# Patient Record
Sex: Male | Born: 1955 | Race: White | Hispanic: No | Marital: Single | Smoking: Never smoker
Health system: Southern US, Community
[De-identification: ages and names within clinical notes are randomized; demographics above are authoritative.]

## PROBLEM LIST (undated history)

## (undated) ENCOUNTER — Encounter

## (undated) ENCOUNTER — Telehealth: Attending: Cardiovascular Disease | Primary: Cardiovascular Disease

## (undated) ENCOUNTER — Ambulatory Visit: Payer: PRIVATE HEALTH INSURANCE | Attending: Adult Health | Primary: Adult Health

## (undated) ENCOUNTER — Telehealth

## (undated) ENCOUNTER — Ambulatory Visit: Payer: PRIVATE HEALTH INSURANCE

## (undated) ENCOUNTER — Inpatient Hospital Stay

## (undated) ENCOUNTER — Encounter: Attending: Family | Primary: Family

## (undated) ENCOUNTER — Encounter: Attending: Adult Health | Primary: Adult Health

## (undated) ENCOUNTER — Ambulatory Visit: Payer: MEDICARE

## (undated) ENCOUNTER — Encounter: Attending: Cardiovascular Disease | Primary: Cardiovascular Disease

## (undated) ENCOUNTER — Ambulatory Visit

## (undated) ENCOUNTER — Ambulatory Visit
Payer: PRIVATE HEALTH INSURANCE | Attending: Rehabilitative and Restorative Service Providers" | Primary: Rehabilitative and Restorative Service Providers"

## (undated) ENCOUNTER — Telehealth: Attending: Adult Health | Primary: Adult Health

## (undated) ENCOUNTER — Ambulatory Visit: Payer: PRIVATE HEALTH INSURANCE | Attending: Geriatric Medicine | Primary: Geriatric Medicine

## (undated) ENCOUNTER — Ambulatory Visit: Payer: PRIVATE HEALTH INSURANCE | Attending: Internal Medicine | Primary: Internal Medicine

## (undated) ENCOUNTER — Ambulatory Visit: Payer: PRIVATE HEALTH INSURANCE | Attending: Cardiovascular Disease | Primary: Cardiovascular Disease

## (undated) ENCOUNTER — Encounter
Attending: Student in an Organized Health Care Education/Training Program | Primary: Student in an Organized Health Care Education/Training Program

## (undated) ENCOUNTER — Ambulatory Visit: Attending: Cardiovascular Disease | Primary: Cardiovascular Disease

## (undated) ENCOUNTER — Telehealth
Attending: Student in an Organized Health Care Education/Training Program | Primary: Student in an Organized Health Care Education/Training Program

## (undated) ENCOUNTER — Encounter: Attending: Specialist | Primary: Specialist

## (undated) ENCOUNTER — Encounter: Attending: Pulmonary Disease | Primary: Pulmonary Disease

## (undated) ENCOUNTER — Other Ambulatory Visit

## (undated) ENCOUNTER — Encounter: Attending: Vascular Surgery | Primary: Vascular Surgery

## (undated) ENCOUNTER — Telehealth: Attending: Pulmonary Disease | Primary: Pulmonary Disease

## (undated) ENCOUNTER — Encounter: Attending: Internal Medicine | Primary: Internal Medicine

## (undated) ENCOUNTER — Telehealth: Attending: Family | Primary: Family

## (undated) ENCOUNTER — Ambulatory Visit: Attending: Adult Health | Primary: Adult Health

## (undated) ENCOUNTER — Ambulatory Visit: Payer: MEDICARE | Attending: Adult Health | Primary: Adult Health

## (undated) ENCOUNTER — Telehealth: Attending: Registered" | Primary: Registered"

## (undated) ENCOUNTER — Ambulatory Visit: Payer: MEDICARE | Attending: Internal Medicine | Primary: Internal Medicine

## (undated) ENCOUNTER — Ambulatory Visit
Payer: MEDICARE | Attending: Student in an Organized Health Care Education/Training Program | Primary: Student in an Organized Health Care Education/Training Program

## (undated) ENCOUNTER — Telehealth: Attending: Internal Medicine | Primary: Internal Medicine

## (undated) ENCOUNTER — Ambulatory Visit
Attending: Rehabilitative and Restorative Service Providers" | Primary: Rehabilitative and Restorative Service Providers"

## (undated) ENCOUNTER — Ambulatory Visit: Payer: MEDICARE | Attending: Cardiovascular Disease | Primary: Cardiovascular Disease

## (undated) ENCOUNTER — Ambulatory Visit: Payer: MEDICARE | Attending: Vascular Surgery | Primary: Vascular Surgery

## (undated) ENCOUNTER — Ambulatory Visit: Attending: Pharmacist | Primary: Pharmacist

## (undated) ENCOUNTER — Ambulatory Visit: Payer: MEDICARE | Attending: Nephrology | Primary: Nephrology

## (undated) ENCOUNTER — Ambulatory Visit: Payer: PRIVATE HEALTH INSURANCE | Attending: General Practice | Primary: General Practice

## (undated) ENCOUNTER — Ambulatory Visit: Attending: Nephrology | Primary: Nephrology

## (undated) ENCOUNTER — Telehealth: Attending: Vascular Surgery | Primary: Vascular Surgery

## (undated) ENCOUNTER — Ambulatory Visit
Payer: PRIVATE HEALTH INSURANCE | Attending: Student in an Organized Health Care Education/Training Program | Primary: Student in an Organized Health Care Education/Training Program

## (undated) ENCOUNTER — Telehealth: Attending: Infectious Disease | Primary: Infectious Disease

## (undated) ENCOUNTER — Encounter: Attending: Diagnostic Radiology | Primary: Diagnostic Radiology

## (undated) DIAGNOSIS — M810 Age-related osteoporosis without current pathological fracture: Secondary | ICD-10-CM

## (undated) DIAGNOSIS — N289 Disorder of kidney and ureter, unspecified: Secondary | ICD-10-CM

## (undated) DIAGNOSIS — I509 Heart failure, unspecified: Secondary | ICD-10-CM

## (undated) DIAGNOSIS — D649 Anemia, unspecified: Secondary | ICD-10-CM

## (undated) DIAGNOSIS — I10 Essential (primary) hypertension: Secondary | ICD-10-CM

## (undated) HISTORY — PX: HEART TRANSPLANT: SHX268

## (undated) HISTORY — PX: AV FISTULA PLACEMENT: SHX1204

---
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
## 2017-04-19 ENCOUNTER — Ambulatory Visit
Admission: RE | Admit: 2017-04-19 | Discharge: 2017-05-18 | Disposition: A | Discharge status: Home / Self Care | Payer: Commercial Managed Care - PPO

## 2017-04-19 ENCOUNTER — Ambulatory Visit: Admission: RE | Admit: 2017-04-19 | Discharge: 2017-05-18 | Disposition: A | Discharge status: Home / Self Care

## 2017-05-03 DIAGNOSIS — I213 ST elevation (STEMI) myocardial infarction of unspecified site: Principal | ICD-10-CM

## 2017-05-05 DIAGNOSIS — I213 ST elevation (STEMI) myocardial infarction of unspecified site: Principal | ICD-10-CM

## 2017-05-08 DIAGNOSIS — I213 ST elevation (STEMI) myocardial infarction of unspecified site: Principal | ICD-10-CM

## 2017-05-10 DIAGNOSIS — I213 ST elevation (STEMI) myocardial infarction of unspecified site: Principal | ICD-10-CM

## 2017-05-12 DIAGNOSIS — I213 ST elevation (STEMI) myocardial infarction of unspecified site: Principal | ICD-10-CM

## 2017-05-15 DIAGNOSIS — I213 ST elevation (STEMI) myocardial infarction of unspecified site: Principal | ICD-10-CM

## 2017-05-17 ENCOUNTER — Ambulatory Visit: Admission: RE | Admit: 2017-05-17 | Discharge: 2017-05-17 | Payer: Commercial Managed Care - PPO

## 2017-05-17 DIAGNOSIS — I213 ST elevation (STEMI) myocardial infarction of unspecified site: Principal | ICD-10-CM

## 2017-05-17 DIAGNOSIS — I509 Heart failure, unspecified: Principal | ICD-10-CM

## 2017-05-18 ENCOUNTER — Ambulatory Visit: Admission: RE | Admit: 2017-05-18 | Discharge: 2017-05-18 | Admitting: Cardiovascular Disease

## 2017-05-18 ENCOUNTER — Ambulatory Visit: Admit: 2017-05-18 | Discharge: 2017-05-18 | Payer: Commercial Managed Care - PPO

## 2017-05-18 DIAGNOSIS — I255 Ischemic cardiomyopathy: Secondary | ICD-10-CM

## 2017-05-18 DIAGNOSIS — I5022 Chronic systolic (congestive) heart failure: Principal | ICD-10-CM

## 2017-05-18 DIAGNOSIS — I2510 Atherosclerotic heart disease of native coronary artery without angina pectoris: Secondary | ICD-10-CM

## 2017-05-18 DIAGNOSIS — N183 Chronic kidney disease, stage 3 (moderate): Secondary | ICD-10-CM

## 2017-05-18 MED ORDER — FUROSEMIDE 20 MG TABLET
ORAL_TABLET | Freq: Every day | ORAL | 11 refills | 0 days | Status: SS
Start: 2017-05-18 — End: 2017-06-26

## 2017-05-19 ENCOUNTER — Ambulatory Visit: Admission: RE | Admit: 2017-05-19 | Discharge: 2017-06-17 | Disposition: A | Discharge status: Home / Self Care

## 2017-05-19 ENCOUNTER — Ambulatory Visit
Admission: RE | Admit: 2017-05-19 | Discharge: 2017-06-17 | Disposition: A | Discharge status: Home / Self Care | Payer: Commercial Managed Care - PPO

## 2017-05-19 DIAGNOSIS — I213 ST elevation (STEMI) myocardial infarction of unspecified site: Principal | ICD-10-CM

## 2017-05-22 DIAGNOSIS — I213 ST elevation (STEMI) myocardial infarction of unspecified site: Principal | ICD-10-CM

## 2017-05-24 ENCOUNTER — Ambulatory Visit: Admission: RE | Admit: 2017-05-24 | Discharge: 2017-05-24

## 2017-05-24 DIAGNOSIS — I2102 ST elevation (STEMI) myocardial infarction involving left anterior descending coronary artery: Principal | ICD-10-CM

## 2017-05-24 DIAGNOSIS — I509 Heart failure, unspecified: Principal | ICD-10-CM

## 2017-05-26 DIAGNOSIS — I2102 ST elevation (STEMI) myocardial infarction involving left anterior descending coronary artery: Principal | ICD-10-CM

## 2017-05-29 DIAGNOSIS — I2102 ST elevation (STEMI) myocardial infarction involving left anterior descending coronary artery: Principal | ICD-10-CM

## 2017-05-29 MED ORDER — LISINOPRIL 5 MG TABLET
ORAL_TABLET | Freq: Every day | ORAL | 2 refills | 0 days | Status: SS
Start: 2017-05-29 — End: 2017-06-26

## 2017-05-29 MED ORDER — ATORVASTATIN 80 MG TABLET
ORAL_TABLET | Freq: Every evening | ORAL | 2 refills | 0 days | Status: CP
Start: 2017-05-29 — End: 2017-08-20

## 2017-05-29 MED ORDER — METOPROLOL SUCCINATE ER 100 MG TABLET,EXTENDED RELEASE 24 HR
ORAL_TABLET | Freq: Every day | ORAL | 11 refills | 0 days | Status: SS
Start: 2017-05-29 — End: 2017-06-26

## 2017-05-29 MED ORDER — PRASUGREL 10 MG TABLET
ORAL_TABLET | Freq: Every day | ORAL | 2 refills | 0.00000 days | Status: CP
Start: 2017-05-29 — End: 2017-08-20

## 2017-05-31 DIAGNOSIS — I2102 ST elevation (STEMI) myocardial infarction involving left anterior descending coronary artery: Principal | ICD-10-CM

## 2017-06-02 DIAGNOSIS — I2102 ST elevation (STEMI) myocardial infarction involving left anterior descending coronary artery: Principal | ICD-10-CM

## 2017-06-05 DIAGNOSIS — I2102 ST elevation (STEMI) myocardial infarction involving left anterior descending coronary artery: Principal | ICD-10-CM

## 2017-06-07 DIAGNOSIS — I2102 ST elevation (STEMI) myocardial infarction involving left anterior descending coronary artery: Principal | ICD-10-CM

## 2017-06-08 ENCOUNTER — Ambulatory Visit
Admission: RE | Admit: 2017-06-08 | Discharge: 2017-06-08 | Payer: Commercial Managed Care - PPO | Attending: Cardiovascular Disease | Admitting: Cardiovascular Disease

## 2017-06-08 DIAGNOSIS — E782 Mixed hyperlipidemia: Secondary | ICD-10-CM

## 2017-06-08 DIAGNOSIS — I2510 Atherosclerotic heart disease of native coronary artery without angina pectoris: Principal | ICD-10-CM

## 2017-06-08 DIAGNOSIS — I5022 Chronic systolic (congestive) heart failure: Secondary | ICD-10-CM

## 2017-06-08 DIAGNOSIS — I2102 ST elevation (STEMI) myocardial infarction involving left anterior descending coronary artery: Secondary | ICD-10-CM

## 2017-06-08 MED ORDER — APIXABAN 5 MG TABLET
ORAL_TABLET | Freq: Two times a day (BID) | ORAL | 11 refills | 0.00000 days | Status: CP
Start: 2017-06-08 — End: 2017-07-11

## 2017-06-09 DIAGNOSIS — I2102 ST elevation (STEMI) myocardial infarction involving left anterior descending coronary artery: Principal | ICD-10-CM

## 2017-06-12 ENCOUNTER — Ambulatory Visit: Admission: RE | Admit: 2017-06-12 | Discharge: 2017-06-12 | Admitting: Otolaryngology

## 2017-06-12 DIAGNOSIS — R040 Epistaxis: Principal | ICD-10-CM

## 2017-06-12 DIAGNOSIS — D68318 Other hemorrhagic disorder due to intrinsic circulating anticoagulants, antibodies, or inhibitors: Secondary | ICD-10-CM

## 2017-06-14 DIAGNOSIS — I2102 ST elevation (STEMI) myocardial infarction involving left anterior descending coronary artery: Principal | ICD-10-CM

## 2017-06-16 DIAGNOSIS — I2102 ST elevation (STEMI) myocardial infarction involving left anterior descending coronary artery: Principal | ICD-10-CM

## 2017-06-19 ENCOUNTER — Ambulatory Visit: Admission: RE | Admit: 2017-06-19 | Discharge: 2017-07-17 | Disposition: A | Discharge status: Home / Self Care

## 2017-06-19 ENCOUNTER — Ambulatory Visit
Admission: RE | Admit: 2017-06-19 | Discharge: 2017-07-17 | Disposition: A | Discharge status: Home / Self Care | Payer: Commercial Managed Care - PPO

## 2017-06-19 DIAGNOSIS — I2102 ST elevation (STEMI) myocardial infarction involving left anterior descending coronary artery: Principal | ICD-10-CM

## 2017-06-21 ENCOUNTER — Ambulatory Visit: Admission: RE | Admit: 2017-06-21 | Discharge: 2017-06-21 | Payer: Commercial Managed Care - PPO

## 2017-06-21 DIAGNOSIS — I509 Heart failure, unspecified: Principal | ICD-10-CM

## 2017-06-21 DIAGNOSIS — I2102 ST elevation (STEMI) myocardial infarction involving left anterior descending coronary artery: Principal | ICD-10-CM

## 2017-06-22 ENCOUNTER — Inpatient Hospital Stay
Admission: EM | Admit: 2017-06-22 | Discharge: 2017-06-26 | Disposition: A | Discharge status: Home / Self Care | Source: Assisted Living Facility | Attending: Cardiovascular Disease

## 2017-06-22 ENCOUNTER — Inpatient Hospital Stay
Admission: EM | Admit: 2017-06-22 | Discharge: 2017-06-26 | Disposition: A | Discharge status: Home / Self Care | Payer: Commercial Managed Care - PPO | Source: Assisted Living Facility

## 2017-06-22 ENCOUNTER — Inpatient Hospital Stay
Admission: EM | Admit: 2017-06-22 | Discharge: 2017-06-26 | Disposition: A | Discharge status: Home / Self Care | Source: Assisted Living Facility

## 2017-06-22 ENCOUNTER — Ambulatory Visit: Admission: RE | Admit: 2017-06-22 | Discharge: 2017-06-22

## 2017-06-22 DIAGNOSIS — I509 Heart failure, unspecified: Principal | ICD-10-CM

## 2017-06-22 DIAGNOSIS — I959 Hypotension, unspecified: Principal | ICD-10-CM

## 2017-06-22 DIAGNOSIS — I5022 Chronic systolic (congestive) heart failure: Principal | ICD-10-CM

## 2017-06-22 DIAGNOSIS — I255 Ischemic cardiomyopathy: Secondary | ICD-10-CM

## 2017-06-25 DIAGNOSIS — I959 Hypotension, unspecified: Principal | ICD-10-CM

## 2017-06-26 DIAGNOSIS — I959 Hypotension, unspecified: Principal | ICD-10-CM

## 2017-06-26 MED ORDER — LISINOPRIL 5 MG TABLET
ORAL_TABLET | Freq: Two times a day (BID) | ORAL | 0 refills | 0 days
Start: 2017-06-26 — End: 2017-08-08

## 2017-06-26 MED ORDER — METOPROLOL SUCCINATE ER 25 MG TABLET,EXTENDED RELEASE 24 HR
ORAL_TABLET | Freq: Every evening | ORAL | 3 refills | 0.00000 days | Status: CP
Start: 2017-06-26 — End: 2017-09-29

## 2017-06-26 MED ORDER — FUROSEMIDE 20 MG TABLET
ORAL_TABLET | 0 refills | 0 days
Start: 2017-06-26 — End: 2017-08-21

## 2017-06-28 ENCOUNTER — Ambulatory Visit: Admission: RE | Admit: 2017-06-28 | Discharge: 2017-06-28

## 2017-07-04 ENCOUNTER — Ambulatory Visit: Admission: RE | Admit: 2017-07-04 | Discharge: 2017-07-04

## 2017-07-04 DIAGNOSIS — I255 Ischemic cardiomyopathy: Secondary | ICD-10-CM

## 2017-07-04 DIAGNOSIS — I2510 Atherosclerotic heart disease of native coronary artery without angina pectoris: Principal | ICD-10-CM

## 2017-07-05 ENCOUNTER — Ambulatory Visit: Admission: RE | Admit: 2017-07-05 | Discharge: 2017-07-05 | Payer: Commercial Managed Care - PPO

## 2017-07-10 DIAGNOSIS — I2102 ST elevation (STEMI) myocardial infarction involving left anterior descending coronary artery: Principal | ICD-10-CM

## 2017-07-11 MED ORDER — APIXABAN 5 MG TABLET
ORAL_TABLET | Freq: Two times a day (BID) | ORAL | 2 refills | 0 days | Status: CP
Start: 2017-07-11 — End: 2017-08-24

## 2017-07-12 ENCOUNTER — Ambulatory Visit: Admission: RE | Admit: 2017-07-12 | Discharge: 2017-07-12 | Payer: Commercial Managed Care - PPO

## 2017-07-12 ENCOUNTER — Ambulatory Visit
Admission: RE | Admit: 2017-07-12 | Discharge: 2017-07-12 | Payer: Commercial Managed Care - PPO | Attending: Cardiovascular Disease | Admitting: Cardiovascular Disease

## 2017-07-12 DIAGNOSIS — I2102 ST elevation (STEMI) myocardial infarction involving left anterior descending coronary artery: Principal | ICD-10-CM

## 2017-07-12 DIAGNOSIS — I509 Heart failure, unspecified: Principal | ICD-10-CM

## 2017-07-12 DIAGNOSIS — I255 Ischemic cardiomyopathy: Principal | ICD-10-CM

## 2017-07-13 ENCOUNTER — Ambulatory Visit
Admission: RE | Admit: 2017-07-13 | Discharge: 2017-07-13 | Disposition: A | Discharge status: Home / Self Care | Payer: Commercial Managed Care - PPO

## 2017-07-13 DIAGNOSIS — I5022 Chronic systolic (congestive) heart failure: Principal | ICD-10-CM

## 2017-07-14 DIAGNOSIS — I2102 ST elevation (STEMI) myocardial infarction involving left anterior descending coronary artery: Principal | ICD-10-CM

## 2017-07-17 DIAGNOSIS — I2102 ST elevation (STEMI) myocardial infarction involving left anterior descending coronary artery: Principal | ICD-10-CM

## 2017-07-19 ENCOUNTER — Ambulatory Visit: Admission: RE | Admit: 2017-07-19 | Discharge: 2017-07-19

## 2017-07-19 ENCOUNTER — Ambulatory Visit: Admission: RE | Admit: 2017-07-19 | Discharge: 2017-08-16 | Disposition: A | Discharge status: Home / Self Care

## 2017-07-19 ENCOUNTER — Ambulatory Visit
Admission: RE | Admit: 2017-07-19 | Discharge: 2017-08-16 | Disposition: A | Discharge status: Home / Self Care | Payer: Commercial Managed Care - PPO

## 2017-07-19 DIAGNOSIS — I2102 ST elevation (STEMI) myocardial infarction involving left anterior descending coronary artery: Principal | ICD-10-CM

## 2017-07-19 DIAGNOSIS — I509 Heart failure, unspecified: Principal | ICD-10-CM

## 2017-07-20 ENCOUNTER — Ambulatory Visit
Admission: RE | Admit: 2017-07-20 | Discharge: 2017-07-20 | Payer: Commercial Managed Care - PPO | Attending: Internal Medicine | Admitting: Internal Medicine

## 2017-07-20 DIAGNOSIS — E782 Mixed hyperlipidemia: Secondary | ICD-10-CM

## 2017-07-20 DIAGNOSIS — I2510 Atherosclerotic heart disease of native coronary artery without angina pectoris: Secondary | ICD-10-CM

## 2017-07-20 DIAGNOSIS — I255 Ischemic cardiomyopathy: Secondary | ICD-10-CM

## 2017-07-20 DIAGNOSIS — I5022 Chronic systolic (congestive) heart failure: Principal | ICD-10-CM

## 2017-07-21 DIAGNOSIS — I2102 ST elevation (STEMI) myocardial infarction involving left anterior descending coronary artery: Principal | ICD-10-CM

## 2017-07-24 DIAGNOSIS — I2102 ST elevation (STEMI) myocardial infarction involving left anterior descending coronary artery: Principal | ICD-10-CM

## 2017-07-26 DIAGNOSIS — I2102 ST elevation (STEMI) myocardial infarction involving left anterior descending coronary artery: Principal | ICD-10-CM

## 2017-07-28 ENCOUNTER — Ambulatory Visit: Admission: RE | Admit: 2017-07-28 | Discharge: 2017-07-28 | Payer: Commercial Managed Care - PPO

## 2017-07-28 DIAGNOSIS — I2510 Atherosclerotic heart disease of native coronary artery without angina pectoris: Secondary | ICD-10-CM

## 2017-07-28 DIAGNOSIS — E782 Mixed hyperlipidemia: Secondary | ICD-10-CM

## 2017-07-28 DIAGNOSIS — I255 Ischemic cardiomyopathy: Secondary | ICD-10-CM

## 2017-07-28 DIAGNOSIS — I5022 Chronic systolic (congestive) heart failure: Principal | ICD-10-CM

## 2017-07-28 DIAGNOSIS — Z5189 Encounter for other specified aftercare: Principal | ICD-10-CM

## 2017-08-08 ENCOUNTER — Ambulatory Visit: Admission: RE | Admit: 2017-08-08 | Discharge: 2017-08-08 | Payer: Commercial Managed Care - PPO

## 2017-08-08 DIAGNOSIS — I509 Heart failure, unspecified: Principal | ICD-10-CM

## 2017-08-08 DIAGNOSIS — I2510 Atherosclerotic heart disease of native coronary artery without angina pectoris: Principal | ICD-10-CM

## 2017-08-08 DIAGNOSIS — I5022 Chronic systolic (congestive) heart failure: Secondary | ICD-10-CM

## 2017-08-08 MED ORDER — LISINOPRIL 5 MG TABLET
ORAL_TABLET | Freq: Two times a day (BID) | ORAL | 0 refills | 0.00000 days | Status: SS
Start: 2017-08-08 — End: 2017-08-24

## 2017-08-21 MED ORDER — ATORVASTATIN 80 MG TABLET
ORAL_TABLET | Freq: Every evening | ORAL | 2 refills | 0.00000 days | Status: CP
Start: 2017-08-21 — End: 2018-03-01

## 2017-08-21 MED ORDER — PRASUGREL 10 MG TABLET
ORAL_TABLET | Freq: Every day | ORAL | 2 refills | 0.00000 days | Status: CP
Start: 2017-08-21 — End: 2018-03-01

## 2017-08-21 MED ORDER — FUROSEMIDE 20 MG TABLET
ORAL_TABLET | 4 refills | 0 days
Start: 2017-08-21 — End: 2017-09-08

## 2017-08-23 ENCOUNTER — Inpatient Hospital Stay
Admission: RE | Admit: 2017-08-23 | Discharge: 2017-08-24 | Disposition: A | Discharge status: Home / Self Care | Payer: Commercial Managed Care - PPO | Attending: Internal Medicine | Admitting: Internal Medicine

## 2017-08-23 ENCOUNTER — Inpatient Hospital Stay: Admission: RE | Admit: 2017-08-23 | Discharge: 2017-08-24 | Disposition: A | Discharge status: Home / Self Care

## 2017-08-23 DIAGNOSIS — I130 Hypertensive heart and chronic kidney disease with heart failure and stage 1 through stage 4 chronic kidney disease, or unspecified chronic kidney disease: Principal | ICD-10-CM

## 2017-08-24 MED ORDER — LISINOPRIL 5 MG TABLET
ORAL_TABLET | Freq: Every morning | ORAL | 0 refills | 0.00000 days
Start: 2017-08-24 — End: 2017-09-29

## 2017-08-30 ENCOUNTER — Ambulatory Visit
Admission: RE | Admit: 2017-08-30 | Discharge: 2017-08-30 | Disposition: A | Discharge status: Home / Self Care | Payer: Commercial Managed Care - PPO | Attending: Cardiovascular Disease | Admitting: Cardiovascular Disease

## 2017-08-30 ENCOUNTER — Ambulatory Visit
Admission: RE | Admit: 2017-08-30 | Discharge: 2017-08-30 | Disposition: A | Discharge status: Home / Self Care | Payer: Commercial Managed Care - PPO

## 2017-08-30 ENCOUNTER — Ambulatory Visit
Admission: RE | Admit: 2017-08-30 | Discharge: 2017-08-30 | Disposition: A | Discharge status: Home / Self Care | Admitting: Cardiovascular Disease

## 2017-08-30 ENCOUNTER — Ambulatory Visit
Admission: RE | Admit: 2017-08-30 | Discharge: 2017-08-30 | Disposition: A | Discharge status: Home / Self Care | Attending: Clinical | Admitting: Clinical

## 2017-08-30 ENCOUNTER — Ambulatory Visit
Admission: RE | Admit: 2017-08-30 | Discharge: 2017-08-30 | Disposition: A | Discharge status: Home / Self Care | Payer: Commercial Managed Care - PPO | Attending: Internal Medicine | Admitting: Internal Medicine

## 2017-08-30 DIAGNOSIS — Z01812 Encounter for preprocedural laboratory examination: Secondary | ICD-10-CM

## 2017-08-30 DIAGNOSIS — I5022 Chronic systolic (congestive) heart failure: Principal | ICD-10-CM

## 2017-08-30 DIAGNOSIS — Z01811 Encounter for preprocedural respiratory examination: Principal | ICD-10-CM

## 2017-08-30 DIAGNOSIS — Z008 Encounter for other general examination: Secondary | ICD-10-CM

## 2017-08-30 DIAGNOSIS — Z01818 Encounter for other preprocedural examination: Secondary | ICD-10-CM

## 2017-08-31 ENCOUNTER — Ambulatory Visit
Admission: RE | Admit: 2017-08-31 | Discharge: 2017-08-31 | Disposition: A | Discharge status: Home / Self Care | Payer: Commercial Managed Care - PPO

## 2017-08-31 ENCOUNTER — Ambulatory Visit
Admission: RE | Admit: 2017-08-31 | Discharge: 2017-08-31 | Disposition: A | Discharge status: Home / Self Care | Payer: Commercial Managed Care - PPO | Attending: Registered" | Admitting: Registered"

## 2017-08-31 ENCOUNTER — Ambulatory Visit: Admission: RE | Admit: 2017-08-31 | Discharge: 2017-08-31 | Disposition: A | Discharge status: Home / Self Care

## 2017-08-31 DIAGNOSIS — Z01818 Encounter for other preprocedural examination: Secondary | ICD-10-CM

## 2017-08-31 DIAGNOSIS — I5022 Chronic systolic (congestive) heart failure: Principal | ICD-10-CM

## 2017-09-01 ENCOUNTER — Ambulatory Visit: Admission: RE | Admit: 2017-09-01 | Discharge: 2017-09-01 | Disposition: A | Discharge status: Home / Self Care

## 2017-09-01 ENCOUNTER — Ambulatory Visit
Admission: RE | Admit: 2017-09-01 | Discharge: 2017-09-01 | Disposition: A | Discharge status: Home / Self Care | Payer: Commercial Managed Care - PPO

## 2017-09-01 DIAGNOSIS — Z01818 Encounter for other preprocedural examination: Principal | ICD-10-CM

## 2017-09-08 ENCOUNTER — Ambulatory Visit: Admission: RE | Admit: 2017-09-08 | Discharge: 2017-09-08 | Admitting: Cardiovascular Disease

## 2017-09-08 ENCOUNTER — Ambulatory Visit: Admission: RE | Admit: 2017-09-08 | Discharge: 2017-09-08

## 2017-09-08 DIAGNOSIS — I513 Intracardiac thrombosis, not elsewhere classified: Secondary | ICD-10-CM

## 2017-09-08 DIAGNOSIS — N183 Chronic kidney disease, stage 3 (moderate): Secondary | ICD-10-CM

## 2017-09-08 DIAGNOSIS — I509 Heart failure, unspecified: Principal | ICD-10-CM

## 2017-09-08 DIAGNOSIS — D649 Anemia, unspecified: Secondary | ICD-10-CM

## 2017-09-08 DIAGNOSIS — I255 Ischemic cardiomyopathy: Secondary | ICD-10-CM

## 2017-09-08 DIAGNOSIS — I2510 Atherosclerotic heart disease of native coronary artery without angina pectoris: Secondary | ICD-10-CM

## 2017-09-08 DIAGNOSIS — I5022 Chronic systolic (congestive) heart failure: Principal | ICD-10-CM

## 2017-09-08 MED ORDER — FUROSEMIDE 20 MG TABLET
ORAL_TABLET | Freq: Every day | ORAL | 4 refills | 0 days
Start: 2017-09-08 — End: 2017-09-29

## 2017-09-20 ENCOUNTER — Ambulatory Visit: Admission: RE | Admit: 2017-09-20 | Discharge: 2017-09-20 | Attending: Family | Admitting: Family

## 2017-09-20 ENCOUNTER — Ambulatory Visit: Admission: RE | Admit: 2017-09-20 | Discharge: 2017-09-20

## 2017-09-20 DIAGNOSIS — D649 Anemia, unspecified: Principal | ICD-10-CM

## 2017-09-20 DIAGNOSIS — I255 Ischemic cardiomyopathy: Secondary | ICD-10-CM

## 2017-09-20 DIAGNOSIS — Z7901 Long term (current) use of anticoagulants: Secondary | ICD-10-CM

## 2017-09-20 DIAGNOSIS — N183 Chronic kidney disease, stage 3 (moderate): Secondary | ICD-10-CM

## 2017-09-20 DIAGNOSIS — Z7902 Long term (current) use of antithrombotics/antiplatelets: Secondary | ICD-10-CM

## 2017-09-20 DIAGNOSIS — I5022 Chronic systolic (congestive) heart failure: Secondary | ICD-10-CM

## 2017-09-20 DIAGNOSIS — I2510 Atherosclerotic heart disease of native coronary artery without angina pectoris: Secondary | ICD-10-CM

## 2017-09-20 DIAGNOSIS — Z95810 Presence of automatic (implantable) cardiac defibrillator: Secondary | ICD-10-CM

## 2017-09-20 MED ORDER — PEG-ELECTROLYTE SOLUTION 420 GRAM ORAL SOLUTION
0 refills | 0 days | Status: CP
Start: 2017-09-20 — End: 2017-10-10

## 2017-09-20 MED ORDER — PANTOPRAZOLE 20 MG TABLET,DELAYED RELEASE
ORAL_TABLET | Freq: Every day | ORAL | 2 refills | 0 days | Status: CP
Start: 2017-09-20 — End: 2017-12-15

## 2017-09-29 MED ORDER — LISINOPRIL 5 MG TABLET
ORAL_TABLET | Freq: Every morning | ORAL | 3 refills | 0 days
Start: 2017-09-29 — End: 2017-10-30

## 2017-09-29 MED ORDER — METOPROLOL SUCCINATE ER 25 MG TABLET,EXTENDED RELEASE 24 HR
ORAL_TABLET | Freq: Every evening | ORAL | 3 refills | 0 days | Status: CP
Start: 2017-09-29 — End: 2017-11-01

## 2017-09-29 MED ORDER — FUROSEMIDE 20 MG TABLET
ORAL_TABLET | Freq: Every day | ORAL | 4 refills | 0 days | Status: CP
Start: 2017-09-29 — End: 2017-10-10

## 2017-09-29 MED ORDER — LISINOPRIL 2.5 MG TABLET
ORAL_TABLET | Freq: Every evening | ORAL | 3 refills | 0.00000 days | Status: CP
Start: 2017-09-29 — End: 2017-10-30

## 2017-10-10 ENCOUNTER — Ambulatory Visit: Admission: RE | Admit: 2017-10-10 | Discharge: 2017-10-10

## 2017-10-10 ENCOUNTER — Ambulatory Visit: Admission: RE | Admit: 2017-10-10 | Discharge: 2017-10-10 | Payer: Commercial Managed Care - PPO

## 2017-10-10 ENCOUNTER — Ambulatory Visit
Admission: RE | Admit: 2017-10-10 | Discharge: 2017-10-10 | Payer: Commercial Managed Care - PPO | Attending: Cardiovascular Disease | Admitting: Cardiovascular Disease

## 2017-10-10 ENCOUNTER — Ambulatory Visit
Admission: RE | Admit: 2017-10-10 | Discharge: 2017-10-10 | Attending: Cardiovascular Disease | Admitting: Cardiovascular Disease

## 2017-10-10 DIAGNOSIS — I2102 ST elevation (STEMI) myocardial infarction involving left anterior descending coronary artery: Secondary | ICD-10-CM

## 2017-10-10 DIAGNOSIS — I5022 Chronic systolic (congestive) heart failure: Secondary | ICD-10-CM

## 2017-10-10 DIAGNOSIS — Z01818 Encounter for other preprocedural examination: Principal | ICD-10-CM

## 2017-10-10 DIAGNOSIS — I2510 Atherosclerotic heart disease of native coronary artery without angina pectoris: Principal | ICD-10-CM

## 2017-10-10 DIAGNOSIS — I509 Heart failure, unspecified: Principal | ICD-10-CM

## 2017-10-10 DIAGNOSIS — E7800 Pure hypercholesterolemia, unspecified: Secondary | ICD-10-CM

## 2017-10-10 DIAGNOSIS — E782 Mixed hyperlipidemia: Secondary | ICD-10-CM

## 2017-10-10 DIAGNOSIS — I255 Ischemic cardiomyopathy: Secondary | ICD-10-CM

## 2017-10-10 DIAGNOSIS — N183 Chronic kidney disease, stage 3 (moderate): Secondary | ICD-10-CM

## 2017-10-10 DIAGNOSIS — I513 Intracardiac thrombosis, not elsewhere classified: Secondary | ICD-10-CM

## 2017-10-10 MED ORDER — APIXABAN 5 MG TABLET
ORAL_TABLET | Freq: Two times a day (BID) | ORAL | 3 refills | 0.00000 days | Status: CP
Start: 2017-10-10 — End: 2018-03-01

## 2017-10-10 MED ORDER — FUROSEMIDE 20 MG TABLET
ORAL_TABLET | Freq: Every day | ORAL | 4 refills | 0 days | Status: CP
Start: 2017-10-10 — End: 2017-12-01

## 2017-10-12 ENCOUNTER — Ambulatory Visit
Admission: RE | Admit: 2017-10-12 | Discharge: 2017-10-12 | Disposition: A | Discharge status: Home / Self Care | Payer: Commercial Managed Care - PPO

## 2017-10-12 DIAGNOSIS — Z01818 Encounter for other preprocedural examination: Principal | ICD-10-CM

## 2017-10-13 ENCOUNTER — Ambulatory Visit: Admit: 2017-10-13 | Discharge: 2017-10-13 | Payer: Commercial Managed Care - PPO

## 2017-10-13 DIAGNOSIS — I255 Ischemic cardiomyopathy: Principal | ICD-10-CM

## 2017-10-13 DIAGNOSIS — I513 Intracardiac thrombosis, not elsewhere classified: Secondary | ICD-10-CM

## 2017-10-30 MED ORDER — LISINOPRIL 2.5 MG TABLET
ORAL_TABLET | Freq: Every evening | ORAL | 3 refills | 0.00000 days | Status: CP
Start: 2017-10-30 — End: 2018-06-15

## 2017-11-01 MED ORDER — METOPROLOL SUCCINATE ER 25 MG TABLET,EXTENDED RELEASE 24 HR
ORAL_TABLET | Freq: Every evening | ORAL | 3 refills | 0.00000 days | Status: CP
Start: 2017-11-01 — End: 2018-06-15

## 2017-11-22 ENCOUNTER — Ambulatory Visit: Admit: 2017-11-22 | Discharge: 2017-11-22 | Payer: PRIVATE HEALTH INSURANCE

## 2017-11-22 DIAGNOSIS — I5022 Chronic systolic (congestive) heart failure: Principal | ICD-10-CM

## 2017-11-22 DIAGNOSIS — I509 Heart failure, unspecified: Principal | ICD-10-CM

## 2017-11-22 MED ORDER — POTASSIUM CHLORIDE ER 20 MEQ TABLET,EXTENDED RELEASE(PART/CRYST)
ORAL_TABLET | Freq: Two times a day (BID) | ORAL | 11 refills | 0 days | Status: CP
Start: 2017-11-22 — End: 2017-12-01

## 2017-11-22 MED ORDER — SPIRONOLACTONE 25 MG TABLET
ORAL_TABLET | Freq: Every day | ORAL | 6 refills | 0 days | Status: CP
Start: 2017-11-22 — End: 2017-12-15

## 2017-12-01 ENCOUNTER — Ambulatory Visit: Admit: 2017-12-01 | Discharge: 2017-12-02 | Payer: PRIVATE HEALTH INSURANCE

## 2017-12-01 ENCOUNTER — Institutional Professional Consult (permissible substitution): Admit: 2017-12-01 | Discharge: 2017-12-02 | Payer: PRIVATE HEALTH INSURANCE

## 2017-12-01 ENCOUNTER — Ambulatory Visit: Admit: 2017-12-01 | Discharge: 2017-12-01 | Payer: PRIVATE HEALTH INSURANCE

## 2017-12-01 DIAGNOSIS — I255 Ischemic cardiomyopathy: Secondary | ICD-10-CM

## 2017-12-01 DIAGNOSIS — I513 Intracardiac thrombosis, not elsewhere classified: Secondary | ICD-10-CM

## 2017-12-01 DIAGNOSIS — I5022 Chronic systolic (congestive) heart failure: Principal | ICD-10-CM

## 2017-12-01 DIAGNOSIS — I2510 Atherosclerotic heart disease of native coronary artery without angina pectoris: Secondary | ICD-10-CM

## 2017-12-01 DIAGNOSIS — Z1211 Encounter for screening for malignant neoplasm of colon: Principal | ICD-10-CM

## 2017-12-01 DIAGNOSIS — I509 Heart failure, unspecified: Principal | ICD-10-CM

## 2017-12-14 ENCOUNTER — Institutional Professional Consult (permissible substitution): Admit: 2017-12-14 | Discharge: 2017-12-15 | Payer: PRIVATE HEALTH INSURANCE

## 2017-12-14 DIAGNOSIS — Z95810 Presence of automatic (implantable) cardiac defibrillator: Principal | ICD-10-CM

## 2017-12-15 MED ORDER — SPIRONOLACTONE 25 MG TABLET
ORAL_TABLET | Freq: Every day | ORAL | 6 refills | 0.00000 days | Status: CP
Start: 2017-12-15 — End: 2018-01-11

## 2017-12-19 MED ORDER — PANTOPRAZOLE 20 MG TABLET,DELAYED RELEASE
ORAL_TABLET | Freq: Every day | ORAL | 1 refills | 0 days | Status: SS
Start: 2017-12-19 — End: 2018-06-14

## 2017-12-21 ENCOUNTER — Encounter
Admit: 2017-12-21 | Discharge: 2017-12-21 | Payer: PRIVATE HEALTH INSURANCE | Attending: Anesthesiology | Primary: Anesthesiology

## 2017-12-21 ENCOUNTER — Encounter: Admit: 2017-12-21 | Discharge: 2017-12-21 | Payer: PRIVATE HEALTH INSURANCE

## 2017-12-21 ENCOUNTER — Ambulatory Visit: Admit: 2017-12-21 | Discharge: 2017-12-21 | Payer: PRIVATE HEALTH INSURANCE

## 2017-12-21 DIAGNOSIS — Z1211 Encounter for screening for malignant neoplasm of colon: Principal | ICD-10-CM

## 2018-01-08 ENCOUNTER — Ambulatory Visit: Admit: 2018-01-08 | Discharge: 2018-01-08 | Payer: PRIVATE HEALTH INSURANCE

## 2018-01-08 DIAGNOSIS — I5022 Chronic systolic (congestive) heart failure: Principal | ICD-10-CM

## 2018-01-08 DIAGNOSIS — I509 Heart failure, unspecified: Principal | ICD-10-CM

## 2018-01-11 MED ORDER — SPIRONOLACTONE 25 MG TABLET
ORAL_TABLET | Freq: Every day | ORAL | 6 refills | 0 days | Status: CP
Start: 2018-01-11 — End: 2018-03-30

## 2018-02-27 MED ORDER — PEG-ELECTROLYTE SOLUTION 420 GRAM ORAL SOLUTION
0 refills | 0 days | Status: SS
Start: 2018-02-27 — End: 2018-05-08

## 2018-03-01 MED ORDER — FUROSEMIDE 40 MG TABLET
ORAL_TABLET | Freq: Every morning | ORAL | 6 refills | 0 days | Status: CP
Start: 2018-03-01 — End: 2018-03-30

## 2018-03-01 MED ORDER — APIXABAN 5 MG TABLET
ORAL_TABLET | Freq: Two times a day (BID) | ORAL | 0 refills | 0 days | Status: CP
Start: 2018-03-01 — End: 2018-06-15

## 2018-03-01 MED ORDER — FUROSEMIDE 20 MG TABLET
ORAL_TABLET | Freq: Every evening | ORAL | 6 refills | 0.00000 days | Status: SS
Start: 2018-03-01 — End: 2018-05-20

## 2018-03-01 MED ORDER — ATORVASTATIN 80 MG TABLET
ORAL_TABLET | Freq: Every morning | ORAL | 1 refills | 0 days | Status: SS
Start: 2018-03-01 — End: 2018-05-20

## 2018-03-01 MED ORDER — PRASUGREL 10 MG TABLET
ORAL_TABLET | Freq: Every day | ORAL | 1 refills | 0 days | Status: CP
Start: 2018-03-01 — End: 2018-06-15

## 2018-03-12 ENCOUNTER — Ambulatory Visit: Admit: 2018-03-12 | Discharge: 2018-03-12 | Payer: PRIVATE HEALTH INSURANCE

## 2018-03-12 DIAGNOSIS — I509 Heart failure, unspecified: Principal | ICD-10-CM

## 2018-03-12 DIAGNOSIS — I5022 Chronic systolic (congestive) heart failure: Principal | ICD-10-CM

## 2018-03-22 ENCOUNTER — Other Ambulatory Visit: Admit: 2018-03-22 | Discharge: 2018-03-22 | Payer: PRIVATE HEALTH INSURANCE

## 2018-03-22 ENCOUNTER — Institutional Professional Consult (permissible substitution): Admit: 2018-03-22 | Discharge: 2018-03-22 | Payer: PRIVATE HEALTH INSURANCE

## 2018-03-22 DIAGNOSIS — I509 Heart failure, unspecified: Principal | ICD-10-CM

## 2018-03-22 DIAGNOSIS — Z125 Encounter for screening for malignant neoplasm of prostate: Secondary | ICD-10-CM

## 2018-03-22 DIAGNOSIS — E782 Mixed hyperlipidemia: Principal | ICD-10-CM

## 2018-03-30 ENCOUNTER — Ambulatory Visit: Admit: 2018-03-30 | Discharge: 2018-03-30 | Payer: PRIVATE HEALTH INSURANCE

## 2018-03-30 ENCOUNTER — Institutional Professional Consult (permissible substitution): Admit: 2018-03-30 | Discharge: 2018-03-30 | Payer: PRIVATE HEALTH INSURANCE

## 2018-03-30 DIAGNOSIS — I509 Heart failure, unspecified: Principal | ICD-10-CM

## 2018-03-30 DIAGNOSIS — D649 Anemia, unspecified: Secondary | ICD-10-CM

## 2018-04-04 ENCOUNTER — Ambulatory Visit: Admit: 2018-04-04 | Discharge: 2018-04-05 | Payer: PRIVATE HEALTH INSURANCE

## 2018-04-04 DIAGNOSIS — E875 Hyperkalemia: Secondary | ICD-10-CM

## 2018-04-04 DIAGNOSIS — D649 Anemia, unspecified: Principal | ICD-10-CM

## 2018-04-10 ENCOUNTER — Encounter: Admit: 2018-04-10 | Discharge: 2018-04-10 | Payer: PRIVATE HEALTH INSURANCE

## 2018-04-10 ENCOUNTER — Ambulatory Visit: Admit: 2018-04-10 | Discharge: 2018-04-10 | Payer: PRIVATE HEALTH INSURANCE

## 2018-04-10 DIAGNOSIS — M542 Cervicalgia: Secondary | ICD-10-CM

## 2018-04-10 DIAGNOSIS — I2510 Atherosclerotic heart disease of native coronary artery without angina pectoris: Secondary | ICD-10-CM

## 2018-04-10 DIAGNOSIS — E875 Hyperkalemia: Secondary | ICD-10-CM

## 2018-04-10 DIAGNOSIS — D649 Anemia, unspecified: Principal | ICD-10-CM

## 2018-05-03 ENCOUNTER — Ambulatory Visit: Admit: 2018-05-03 | Discharge: 2018-05-07 | Payer: PRIVATE HEALTH INSURANCE

## 2018-05-08 ENCOUNTER — Ambulatory Visit
Admit: 2018-05-08 | Discharge: 2018-06-15 | Disposition: A | Discharge status: Home Health | Payer: PRIVATE HEALTH INSURANCE | Admitting: Thoracic Surgery (Cardiothoracic Vascular Surgery)

## 2018-05-08 ENCOUNTER — Encounter
Admit: 2018-05-08 | Discharge: 2018-06-15 | Disposition: A | Discharge status: Home Health | Payer: PRIVATE HEALTH INSURANCE | Attending: Student in an Organized Health Care Education/Training Program | Admitting: Thoracic Surgery (Cardiothoracic Vascular Surgery)

## 2018-05-08 ENCOUNTER — Encounter
Admit: 2018-05-08 | Discharge: 2018-06-15 | Disposition: A | Discharge status: Home Health | Payer: PRIVATE HEALTH INSURANCE | Attending: Anesthesiology | Admitting: Thoracic Surgery (Cardiothoracic Vascular Surgery)

## 2018-05-10 DIAGNOSIS — I130 Hypertensive heart and chronic kidney disease with heart failure and stage 1 through stage 4 chronic kidney disease, or unspecified chronic kidney disease: Principal | ICD-10-CM

## 2018-05-11 DIAGNOSIS — I130 Hypertensive heart and chronic kidney disease with heart failure and stage 1 through stage 4 chronic kidney disease, or unspecified chronic kidney disease: Principal | ICD-10-CM

## 2018-05-16 DIAGNOSIS — I130 Hypertensive heart and chronic kidney disease with heart failure and stage 1 through stage 4 chronic kidney disease, or unspecified chronic kidney disease: Principal | ICD-10-CM

## 2018-05-17 DIAGNOSIS — I130 Hypertensive heart and chronic kidney disease with heart failure and stage 1 through stage 4 chronic kidney disease, or unspecified chronic kidney disease: Principal | ICD-10-CM

## 2018-05-18 ENCOUNTER — Ambulatory Visit
Admit: 2018-05-18 | Discharge: 2018-05-19 | Payer: PRIVATE HEALTH INSURANCE | Attending: General Practice | Primary: General Practice

## 2018-05-18 DIAGNOSIS — I130 Hypertensive heart and chronic kidney disease with heart failure and stage 1 through stage 4 chronic kidney disease, or unspecified chronic kidney disease: Principal | ICD-10-CM

## 2018-05-18 DIAGNOSIS — Z0120 Encounter for dental examination and cleaning without abnormal findings: Principal | ICD-10-CM

## 2018-05-19 DIAGNOSIS — I130 Hypertensive heart and chronic kidney disease with heart failure and stage 1 through stage 4 chronic kidney disease, or unspecified chronic kidney disease: Principal | ICD-10-CM

## 2018-05-20 DIAGNOSIS — I130 Hypertensive heart and chronic kidney disease with heart failure and stage 1 through stage 4 chronic kidney disease, or unspecified chronic kidney disease: Principal | ICD-10-CM

## 2018-05-21 DIAGNOSIS — I130 Hypertensive heart and chronic kidney disease with heart failure and stage 1 through stage 4 chronic kidney disease, or unspecified chronic kidney disease: Principal | ICD-10-CM

## 2018-05-22 ENCOUNTER — Ambulatory Visit: Admit: 2018-05-22 | Discharge: 2018-05-23 | Attending: General Practice | Primary: General Practice

## 2018-05-22 DIAGNOSIS — K047 Periapical abscess without sinus: Secondary | ICD-10-CM

## 2018-05-22 DIAGNOSIS — K029 Dental caries, unspecified: Principal | ICD-10-CM

## 2018-05-22 DIAGNOSIS — K0889 Other specified disorders of teeth and supporting structures: Secondary | ICD-10-CM

## 2018-05-22 DIAGNOSIS — I130 Hypertensive heart and chronic kidney disease with heart failure and stage 1 through stage 4 chronic kidney disease, or unspecified chronic kidney disease: Principal | ICD-10-CM

## 2018-05-23 DIAGNOSIS — I130 Hypertensive heart and chronic kidney disease with heart failure and stage 1 through stage 4 chronic kidney disease, or unspecified chronic kidney disease: Principal | ICD-10-CM

## 2018-05-24 DIAGNOSIS — I130 Hypertensive heart and chronic kidney disease with heart failure and stage 1 through stage 4 chronic kidney disease, or unspecified chronic kidney disease: Principal | ICD-10-CM

## 2018-05-25 DIAGNOSIS — I130 Hypertensive heart and chronic kidney disease with heart failure and stage 1 through stage 4 chronic kidney disease, or unspecified chronic kidney disease: Principal | ICD-10-CM

## 2018-05-26 DIAGNOSIS — I130 Hypertensive heart and chronic kidney disease with heart failure and stage 1 through stage 4 chronic kidney disease, or unspecified chronic kidney disease: Principal | ICD-10-CM

## 2018-05-27 DIAGNOSIS — I130 Hypertensive heart and chronic kidney disease with heart failure and stage 1 through stage 4 chronic kidney disease, or unspecified chronic kidney disease: Principal | ICD-10-CM

## 2018-05-28 DIAGNOSIS — I130 Hypertensive heart and chronic kidney disease with heart failure and stage 1 through stage 4 chronic kidney disease, or unspecified chronic kidney disease: Principal | ICD-10-CM

## 2018-05-29 DIAGNOSIS — I130 Hypertensive heart and chronic kidney disease with heart failure and stage 1 through stage 4 chronic kidney disease, or unspecified chronic kidney disease: Principal | ICD-10-CM

## 2018-05-30 DIAGNOSIS — I130 Hypertensive heart and chronic kidney disease with heart failure and stage 1 through stage 4 chronic kidney disease, or unspecified chronic kidney disease: Principal | ICD-10-CM

## 2018-05-31 DIAGNOSIS — I130 Hypertensive heart and chronic kidney disease with heart failure and stage 1 through stage 4 chronic kidney disease, or unspecified chronic kidney disease: Principal | ICD-10-CM

## 2018-06-01 DIAGNOSIS — I130 Hypertensive heart and chronic kidney disease with heart failure and stage 1 through stage 4 chronic kidney disease, or unspecified chronic kidney disease: Principal | ICD-10-CM

## 2018-06-02 DIAGNOSIS — I130 Hypertensive heart and chronic kidney disease with heart failure and stage 1 through stage 4 chronic kidney disease, or unspecified chronic kidney disease: Principal | ICD-10-CM

## 2018-06-03 DIAGNOSIS — I130 Hypertensive heart and chronic kidney disease with heart failure and stage 1 through stage 4 chronic kidney disease, or unspecified chronic kidney disease: Principal | ICD-10-CM

## 2018-06-04 DIAGNOSIS — I130 Hypertensive heart and chronic kidney disease with heart failure and stage 1 through stage 4 chronic kidney disease, or unspecified chronic kidney disease: Principal | ICD-10-CM

## 2018-06-05 DIAGNOSIS — I130 Hypertensive heart and chronic kidney disease with heart failure and stage 1 through stage 4 chronic kidney disease, or unspecified chronic kidney disease: Principal | ICD-10-CM

## 2018-06-06 DIAGNOSIS — I130 Hypertensive heart and chronic kidney disease with heart failure and stage 1 through stage 4 chronic kidney disease, or unspecified chronic kidney disease: Principal | ICD-10-CM

## 2018-06-07 DIAGNOSIS — I130 Hypertensive heart and chronic kidney disease with heart failure and stage 1 through stage 4 chronic kidney disease, or unspecified chronic kidney disease: Principal | ICD-10-CM

## 2018-06-08 DIAGNOSIS — I130 Hypertensive heart and chronic kidney disease with heart failure and stage 1 through stage 4 chronic kidney disease, or unspecified chronic kidney disease: Principal | ICD-10-CM

## 2018-06-09 DIAGNOSIS — I130 Hypertensive heart and chronic kidney disease with heart failure and stage 1 through stage 4 chronic kidney disease, or unspecified chronic kidney disease: Principal | ICD-10-CM

## 2018-06-10 DIAGNOSIS — I130 Hypertensive heart and chronic kidney disease with heart failure and stage 1 through stage 4 chronic kidney disease, or unspecified chronic kidney disease: Principal | ICD-10-CM

## 2018-06-12 DIAGNOSIS — I130 Hypertensive heart and chronic kidney disease with heart failure and stage 1 through stage 4 chronic kidney disease, or unspecified chronic kidney disease: Principal | ICD-10-CM

## 2018-06-14 ENCOUNTER — Inpatient Hospital Stay: Admit: 2018-06-14 | Payer: PRIVATE HEALTH INSURANCE

## 2018-06-14 MED ORDER — COLCHICINE 0.6 MG TABLET
ORAL_TABLET | Freq: Every day | ORAL | 11 refills | 0.00000 days | Status: CP | PRN
Start: 2018-06-14 — End: 2018-08-23
  Filled 2018-06-15: qty 10, 10d supply, fill #0

## 2018-06-14 MED ORDER — CHOLECALCIFEROL (VITAMIN D3) 125 MCG (5,000 UNIT) CAPSULE
ORAL_CAPSULE | Freq: Every day | ORAL | 9 refills | 0 days | Status: CP
Start: 2018-06-14 — End: ?
  Filled 2018-06-15: qty 100, 100d supply, fill #0

## 2018-06-14 MED ORDER — WARFARIN 1 MG TABLET
ORAL_TABLET | 11 refills | 0 days | Status: CP
Start: 2018-06-14 — End: 2018-06-15

## 2018-06-14 MED ORDER — ATORVASTATIN 80 MG TABLET
ORAL_TABLET | Freq: Every day | ORAL | 11 refills | 0 days | Status: CP
Start: 2018-06-14 — End: 2018-07-21

## 2018-06-14 MED ORDER — MAGNESIUM OXIDE 400 MG (241.3 MG MAGNESIUM) TABLET
ORAL_TABLET | Freq: Two times a day (BID) | ORAL | 3 refills | 0 days | Status: CP
Start: 2018-06-14 — End: 2019-06-14
  Filled 2018-06-15: qty 120, 60d supply, fill #0

## 2018-06-14 MED ORDER — ALLOPURINOL 100 MG TABLET
ORAL_TABLET | Freq: Every day | ORAL | 3 refills | 0.00000 days | Status: CP
Start: 2018-06-14 — End: 2018-07-12
  Filled 2018-06-15: qty 60, 30d supply, fill #0

## 2018-06-14 MED ORDER — PANTOPRAZOLE 20 MG TABLET,DELAYED RELEASE
ORAL_TABLET | Freq: Every day | ORAL | 9 refills | 0 days | Status: CP
Start: 2018-06-14 — End: 2018-09-07
  Filled 2018-06-15: qty 30, 30d supply, fill #0

## 2018-06-14 MED ORDER — FUROSEMIDE 20 MG TABLET
ORAL_TABLET | Freq: Every day | ORAL | 11 refills | 0 days | Status: CP
Start: 2018-06-14 — End: 2018-08-15
  Filled 2018-06-15: qty 60, 30d supply, fill #0

## 2018-06-15 DIAGNOSIS — I130 Hypertensive heart and chronic kidney disease with heart failure and stage 1 through stage 4 chronic kidney disease, or unspecified chronic kidney disease: Principal | ICD-10-CM

## 2018-06-15 MED ORDER — WARFARIN 5 MG TABLET: tablet | 11 refills | 0 days | Status: AC

## 2018-06-15 MED ORDER — LOSARTAN 25 MG TABLET
ORAL_TABLET | Freq: Every day | ORAL | 9 refills | 0.00000 days | Status: CP
Start: 2018-06-15 — End: 2018-07-12
  Filled 2018-06-15: qty 30, 30d supply, fill #0

## 2018-06-15 MED ORDER — WARFARIN 5 MG TABLET
ORAL_TABLET | 11 refills | 0.00000 days | Status: CP
Start: 2018-06-15 — End: 2018-06-27
  Filled 2018-06-15: qty 30, 30d supply, fill #0

## 2018-06-15 MED ORDER — WARFARIN 1 MG TABLET
ORAL_TABLET | 11 refills | 0 days | Status: CP
Start: 2018-06-15 — End: 2018-06-27
  Filled 2018-06-15: qty 30, 30d supply, fill #0

## 2018-06-15 MED FILL — WARFARIN 1 MG TABLET: 30 days supply | Qty: 30 | Fill #0 | Status: AC

## 2018-06-15 MED FILL — LOSARTAN 25 MG TABLET: 30 days supply | Qty: 30 | Fill #0 | Status: AC

## 2018-06-15 MED FILL — ALLOPURINOL 100 MG TABLET: 30 days supply | Qty: 60 | Fill #0 | Status: AC

## 2018-06-15 MED FILL — CHOLECALCIFEROL (VITAMIN D3) 125 MCG (5,000 UNIT) CAPSULE: 100 days supply | Qty: 100 | Fill #0 | Status: AC

## 2018-06-15 MED FILL — MAGNESIUM OXIDE 400 MG (241.3 MG MAGNESIUM) TABLET: 60 days supply | Qty: 120 | Fill #0 | Status: AC

## 2018-06-15 MED FILL — PANTOPRAZOLE 20 MG TABLET,DELAYED RELEASE: 30 days supply | Qty: 30 | Fill #0 | Status: AC

## 2018-06-15 MED FILL — WARFARIN 5 MG TABLET: 30 days supply | Qty: 30 | Fill #0 | Status: AC

## 2018-06-15 MED FILL — FUROSEMIDE 20 MG TABLET: 30 days supply | Qty: 60 | Fill #0 | Status: AC

## 2018-06-15 MED FILL — COLCHICINE 0.6 MG TABLET: 10 days supply | Qty: 10 | Fill #0 | Status: AC

## 2018-06-16 MED ORDER — BISACODYL 10 MG/30 ML ENEMA
Freq: Once | RECTAL | 0 refills | 0.00000 days
Start: 2018-06-16 — End: 2018-06-16

## 2018-06-18 ENCOUNTER — Other Ambulatory Visit (HOSPITAL_COMMUNITY)
Admission: RE | Admit: 2018-06-18 | Discharge: 2018-06-18 | Disposition: A | Discharge status: Home / Self Care | Payer: 59 | Source: Skilled Nursing Facility | Attending: Cardiology | Admitting: Cardiology

## 2018-06-18 DIAGNOSIS — I509 Heart failure, unspecified: Secondary | ICD-10-CM | POA: Diagnosis not present

## 2018-06-18 LAB — CBC
HCT: 35.3 % — ABNORMAL LOW (ref 39.0–52.0)
Hemoglobin: 11.1 g/dL — ABNORMAL LOW (ref 13.0–17.0)
MCH: 30.1 pg (ref 26.0–34.0)
MCHC: 31.4 g/dL (ref 30.0–36.0)
MCV: 95.7 fL (ref 78.0–100.0)
PLATELETS: 430 10*3/uL — AB (ref 150–400)
RBC: 3.69 MIL/uL — ABNORMAL LOW (ref 4.22–5.81)
RDW: 16.4 % — ABNORMAL HIGH (ref 11.5–15.5)
WBC: 13 10*3/uL — ABNORMAL HIGH (ref 4.0–10.5)

## 2018-06-18 LAB — BASIC METABOLIC PANEL
Anion gap: 13 (ref 5–15)
BUN: 21 mg/dL — AB (ref 6–20)
CHLORIDE: 101 mmol/L (ref 98–111)
CO2: 28 mmol/L (ref 22–32)
CREATININE: 1.33 mg/dL — AB (ref 0.61–1.24)
Calcium: 9.6 mg/dL (ref 8.9–10.3)
GFR calc Af Amer: >60 mL/min (ref >60)
GFR calc non Af Amer: 57 mL/min — ABNORMAL LOW (ref >60)
Glucose, Bld: 106 mg/dL — ABNORMAL HIGH (ref 70–99)
Potassium: 4.5 mmol/L (ref 3.5–5.1)
SODIUM: 142 mmol/L (ref 135–145)

## 2018-06-18 LAB — PROTIME-INR
INR: 1.85
Prothrombin Time: 21.1 seconds — ABNORMAL HIGH (ref 11.4–15.2)

## 2018-06-27 ENCOUNTER — Ambulatory Visit: Admit: 2018-06-27 | Discharge: 2018-06-28 | Payer: PRIVATE HEALTH INSURANCE

## 2018-06-27 DIAGNOSIS — I5022 Chronic systolic (congestive) heart failure: Secondary | ICD-10-CM

## 2018-06-27 DIAGNOSIS — Z95811 Presence of heart assist device: Secondary | ICD-10-CM

## 2018-06-27 DIAGNOSIS — I429 Cardiomyopathy, unspecified: Principal | ICD-10-CM

## 2018-06-27 DIAGNOSIS — I5020 Unspecified systolic (congestive) heart failure: Principal | ICD-10-CM

## 2018-06-27 MED ORDER — WARFARIN 1 MG TABLET
ORAL_TABLET | 11 refills | 0 days | Status: CP
Start: 2018-06-27 — End: 2018-07-04

## 2018-06-27 MED ORDER — WARFARIN 5 MG TABLET
ORAL_TABLET | 11 refills | 0 days | Status: CP
Start: 2018-06-27 — End: 2018-07-04

## 2018-06-27 MED ORDER — METOPROLOL SUCCINATE ER 25 MG TABLET,EXTENDED RELEASE 24 HR
ORAL_TABLET | Freq: Every day | ORAL | 6 refills | 0 days | Status: CP
Start: 2018-06-27 — End: 2018-08-01

## 2018-07-04 ENCOUNTER — Ambulatory Visit: Admit: 2018-07-04 | Discharge: 2018-07-05 | Payer: PRIVATE HEALTH INSURANCE

## 2018-07-04 ENCOUNTER — Ambulatory Visit
Admit: 2018-07-04 | Discharge: 2018-07-05 | Payer: PRIVATE HEALTH INSURANCE | Attending: Cardiovascular Disease | Primary: Cardiovascular Disease

## 2018-07-04 DIAGNOSIS — Z125 Encounter for screening for malignant neoplasm of prostate: Secondary | ICD-10-CM

## 2018-07-04 DIAGNOSIS — I272 Pulmonary hypertension, unspecified: Secondary | ICD-10-CM

## 2018-07-04 DIAGNOSIS — I255 Ischemic cardiomyopathy: Secondary | ICD-10-CM

## 2018-07-04 DIAGNOSIS — I5022 Chronic systolic (congestive) heart failure: Principal | ICD-10-CM

## 2018-07-04 DIAGNOSIS — E611 Iron deficiency: Secondary | ICD-10-CM

## 2018-07-04 DIAGNOSIS — Z95811 Presence of heart assist device: Secondary | ICD-10-CM

## 2018-07-04 DIAGNOSIS — Z7901 Long term (current) use of anticoagulants: Secondary | ICD-10-CM

## 2018-07-04 MED ORDER — WARFARIN 1 MG TABLET
ORAL_TABLET | Freq: Every day | ORAL | 11 refills | 0 days | Status: CP
Start: 2018-07-04 — End: 2018-08-15

## 2018-07-09 ENCOUNTER — Other Ambulatory Visit: Admit: 2018-07-09 | Discharge: 2018-07-09 | Payer: PRIVATE HEALTH INSURANCE

## 2018-07-09 ENCOUNTER — Ambulatory Visit: Admit: 2018-07-09 | Discharge: 2018-07-09 | Payer: PRIVATE HEALTH INSURANCE

## 2018-07-09 ENCOUNTER — Encounter: Admit: 2018-07-09 | Discharge: 2018-07-09 | Payer: PRIVATE HEALTH INSURANCE

## 2018-07-09 DIAGNOSIS — Z125 Encounter for screening for malignant neoplasm of prostate: Principal | ICD-10-CM

## 2018-07-09 DIAGNOSIS — E782 Mixed hyperlipidemia: Secondary | ICD-10-CM

## 2018-07-12 MED ORDER — ALLOPURINOL 100 MG TABLET
ORAL_TABLET | Freq: Every day | ORAL | 3 refills | 0 days | Status: CP
Start: 2018-07-12 — End: 2019-06-21

## 2018-07-12 MED ORDER — LOSARTAN 25 MG TABLET
ORAL_TABLET | Freq: Every day | ORAL | 9 refills | 0.00000 days | Status: CP
Start: 2018-07-12 — End: 2018-08-01

## 2018-07-16 ENCOUNTER — Ambulatory Visit: Admit: 2018-07-16 | Discharge: 2018-07-17 | Payer: PRIVATE HEALTH INSURANCE

## 2018-07-16 DIAGNOSIS — N183 Chronic kidney disease, stage 3 (moderate): Secondary | ICD-10-CM

## 2018-07-16 DIAGNOSIS — I2510 Atherosclerotic heart disease of native coronary artery without angina pectoris: Secondary | ICD-10-CM

## 2018-07-16 DIAGNOSIS — E782 Mixed hyperlipidemia: Principal | ICD-10-CM

## 2018-07-18 ENCOUNTER — Ambulatory Visit: Admit: 2018-07-18 | Discharge: 2018-07-19 | Payer: PRIVATE HEALTH INSURANCE

## 2018-07-18 ENCOUNTER — Ambulatory Visit
Admit: 2018-07-18 | Discharge: 2018-07-19 | Payer: PRIVATE HEALTH INSURANCE | Attending: Cardiovascular Disease | Primary: Cardiovascular Disease

## 2018-07-18 DIAGNOSIS — N183 Chronic kidney disease, stage 3 (moderate): Secondary | ICD-10-CM

## 2018-07-18 DIAGNOSIS — I5022 Chronic systolic (congestive) heart failure: Principal | ICD-10-CM

## 2018-07-18 DIAGNOSIS — Z95811 Presence of heart assist device: Principal | ICD-10-CM

## 2018-07-18 DIAGNOSIS — I2510 Atherosclerotic heart disease of native coronary artery without angina pectoris: Secondary | ICD-10-CM

## 2018-07-18 DIAGNOSIS — I513 Intracardiac thrombosis, not elsewhere classified: Secondary | ICD-10-CM

## 2018-07-18 DIAGNOSIS — I255 Ischemic cardiomyopathy: Secondary | ICD-10-CM

## 2018-07-24 MED ORDER — ATORVASTATIN 80 MG TABLET
ORAL_TABLET | Freq: Every day | ORAL | 3 refills | 0.00000 days | Status: CP
Start: 2018-07-24 — End: 2019-01-29

## 2018-07-25 ENCOUNTER — Ambulatory Visit: Admit: 2018-07-25 | Discharge: 2018-07-26 | Payer: PRIVATE HEALTH INSURANCE

## 2018-07-25 DIAGNOSIS — I5022 Chronic systolic (congestive) heart failure: Principal | ICD-10-CM

## 2018-08-01 ENCOUNTER — Ambulatory Visit
Admit: 2018-08-01 | Discharge: 2018-08-02 | Payer: PRIVATE HEALTH INSURANCE | Attending: Cardiovascular Disease | Primary: Cardiovascular Disease

## 2018-08-01 ENCOUNTER — Ambulatory Visit: Admit: 2018-08-01 | Discharge: 2018-08-02 | Payer: PRIVATE HEALTH INSURANCE

## 2018-08-01 DIAGNOSIS — N183 Chronic kidney disease, stage 3 (moderate): Secondary | ICD-10-CM

## 2018-08-01 DIAGNOSIS — Z95811 Presence of heart assist device: Principal | ICD-10-CM

## 2018-08-01 DIAGNOSIS — E782 Mixed hyperlipidemia: Secondary | ICD-10-CM

## 2018-08-01 DIAGNOSIS — I5022 Chronic systolic (congestive) heart failure: Principal | ICD-10-CM

## 2018-08-01 DIAGNOSIS — Z95810 Presence of automatic (implantable) cardiac defibrillator: Secondary | ICD-10-CM

## 2018-08-01 DIAGNOSIS — D649 Anemia, unspecified: Secondary | ICD-10-CM

## 2018-08-01 MED ORDER — LOSARTAN 25 MG TABLET
ORAL_TABLET | Freq: Every day | ORAL | 3 refills | 0 days | Status: CP
Start: 2018-08-01 — End: 2018-08-15

## 2018-08-01 MED ORDER — SPIRONOLACTONE 25 MG TABLET
ORAL_TABLET | Freq: Every day | ORAL | 2 refills | 0 days | Status: CP
Start: 2018-08-01 — End: 2018-10-03

## 2018-08-01 MED ORDER — METOPROLOL SUCCINATE ER 25 MG TABLET,EXTENDED RELEASE 24 HR
ORAL_TABLET | Freq: Every day | ORAL | 6 refills | 0.00000 days | Status: CP
Start: 2018-08-01 — End: 2018-12-26

## 2018-08-07 ENCOUNTER — Institutional Professional Consult (permissible substitution): Admit: 2018-08-07 | Discharge: 2018-09-05 | Payer: PRIVATE HEALTH INSURANCE

## 2018-08-07 ENCOUNTER — Ambulatory Visit: Admit: 2018-08-07 | Discharge: 2018-08-08 | Payer: PRIVATE HEALTH INSURANCE

## 2018-08-07 ENCOUNTER — Ambulatory Visit: Admit: 2018-08-07 | Discharge: 2018-09-05 | Payer: PRIVATE HEALTH INSURANCE

## 2018-08-07 DIAGNOSIS — I5022 Chronic systolic (congestive) heart failure: Secondary | ICD-10-CM

## 2018-08-07 DIAGNOSIS — Z95811 Presence of heart assist device: Principal | ICD-10-CM

## 2018-08-13 DIAGNOSIS — I5022 Chronic systolic (congestive) heart failure: Principal | ICD-10-CM

## 2018-08-15 ENCOUNTER — Ambulatory Visit: Admit: 2018-08-15 | Discharge: 2018-08-16 | Payer: PRIVATE HEALTH INSURANCE

## 2018-08-15 ENCOUNTER — Ambulatory Visit: Admit: 2018-08-15 | Discharge: 2018-08-15 | Payer: PRIVATE HEALTH INSURANCE

## 2018-08-15 ENCOUNTER — Ambulatory Visit
Admit: 2018-08-15 | Discharge: 2018-08-15 | Payer: PRIVATE HEALTH INSURANCE | Attending: Cardiovascular Disease | Primary: Cardiovascular Disease

## 2018-08-15 DIAGNOSIS — I513 Intracardiac thrombosis, not elsewhere classified: Secondary | ICD-10-CM

## 2018-08-15 DIAGNOSIS — Z95811 Presence of heart assist device: Secondary | ICD-10-CM

## 2018-08-15 DIAGNOSIS — I5022 Chronic systolic (congestive) heart failure: Principal | ICD-10-CM

## 2018-08-15 DIAGNOSIS — I255 Ischemic cardiomyopathy: Secondary | ICD-10-CM

## 2018-08-15 DIAGNOSIS — I2510 Atherosclerotic heart disease of native coronary artery without angina pectoris: Secondary | ICD-10-CM

## 2018-08-15 DIAGNOSIS — N183 Chronic kidney disease, stage 3 (moderate): Secondary | ICD-10-CM

## 2018-08-15 DIAGNOSIS — D649 Anemia, unspecified: Secondary | ICD-10-CM

## 2018-08-15 DIAGNOSIS — Z95810 Presence of automatic (implantable) cardiac defibrillator: Secondary | ICD-10-CM

## 2018-08-15 MED ORDER — FUROSEMIDE 20 MG TABLET
Freq: Every day | ORAL | 0 days
Start: 2018-08-15 — End: 2018-11-14

## 2018-08-15 MED ORDER — LOSARTAN 25 MG TABLET
ORAL_TABLET | Freq: Two times a day (BID) | ORAL | 3 refills | 0 days | Status: SS
Start: 2018-08-15 — End: 2018-09-04

## 2018-08-15 MED ORDER — WARFARIN 1 MG TABLET
Freq: Every day | ORAL | 0.00000 days
Start: 2018-08-15 — End: 2018-09-13

## 2018-08-17 DIAGNOSIS — I5022 Chronic systolic (congestive) heart failure: Principal | ICD-10-CM

## 2018-08-20 DIAGNOSIS — I5022 Chronic systolic (congestive) heart failure: Principal | ICD-10-CM

## 2018-08-22 DIAGNOSIS — I5022 Chronic systolic (congestive) heart failure: Principal | ICD-10-CM

## 2018-08-23 ENCOUNTER — Ambulatory Visit
Admit: 2018-08-23 | Discharge: 2018-08-24 | Payer: PRIVATE HEALTH INSURANCE | Attending: Cardiovascular Disease | Primary: Cardiovascular Disease

## 2018-08-23 DIAGNOSIS — I5022 Chronic systolic (congestive) heart failure: Secondary | ICD-10-CM

## 2018-08-23 DIAGNOSIS — I255 Ischemic cardiomyopathy: Secondary | ICD-10-CM

## 2018-08-23 DIAGNOSIS — E782 Mixed hyperlipidemia: Secondary | ICD-10-CM

## 2018-08-23 DIAGNOSIS — N183 Chronic kidney disease, stage 3 (moderate): Secondary | ICD-10-CM

## 2018-08-23 DIAGNOSIS — Z95810 Presence of automatic (implantable) cardiac defibrillator: Secondary | ICD-10-CM

## 2018-08-23 DIAGNOSIS — Z95811 Presence of heart assist device: Secondary | ICD-10-CM

## 2018-08-23 DIAGNOSIS — I2510 Atherosclerotic heart disease of native coronary artery without angina pectoris: Principal | ICD-10-CM

## 2018-08-24 DIAGNOSIS — I5022 Chronic systolic (congestive) heart failure: Principal | ICD-10-CM

## 2018-08-27 DIAGNOSIS — I5022 Chronic systolic (congestive) heart failure: Principal | ICD-10-CM

## 2018-08-29 DIAGNOSIS — I5022 Chronic systolic (congestive) heart failure: Principal | ICD-10-CM

## 2018-08-31 ENCOUNTER — Ambulatory Visit: Admit: 2018-08-31 | Discharge: 2018-09-01 | Payer: PRIVATE HEALTH INSURANCE

## 2018-08-31 DIAGNOSIS — I5022 Chronic systolic (congestive) heart failure: Principal | ICD-10-CM

## 2018-09-03 DIAGNOSIS — I5022 Chronic systolic (congestive) heart failure: Principal | ICD-10-CM

## 2018-09-04 ENCOUNTER — Institutional Professional Consult (permissible substitution): Admit: 2018-09-04 | Discharge: 2018-09-04 | Payer: PRIVATE HEALTH INSURANCE

## 2018-09-04 ENCOUNTER — Ambulatory Visit: Admit: 2018-09-04 | Discharge: 2018-09-04 | Payer: PRIVATE HEALTH INSURANCE

## 2018-09-04 DIAGNOSIS — Z95811 Presence of heart assist device: Secondary | ICD-10-CM

## 2018-09-04 DIAGNOSIS — I5022 Chronic systolic (congestive) heart failure: Principal | ICD-10-CM

## 2018-09-04 MED ORDER — ENOXAPARIN 60 MG/0.6 ML SUBCUTANEOUS SYRINGE
INJECTION | Freq: Every day | SUBCUTANEOUS | 1 refills | 0 days | Status: CP
Start: 2018-09-04 — End: 2018-12-07

## 2018-09-04 MED ORDER — SACUBITRIL 24 MG-VALSARTAN 26 MG TABLET
ORAL_TABLET | Freq: Two times a day (BID) | ORAL | 11 refills | 0 days | Status: CP
Start: 2018-09-04 — End: 2018-09-25

## 2018-09-05 DIAGNOSIS — I5022 Chronic systolic (congestive) heart failure: Principal | ICD-10-CM

## 2018-09-07 ENCOUNTER — Institutional Professional Consult (permissible substitution): Admit: 2018-09-07 | Discharge: 2018-10-05 | Payer: PRIVATE HEALTH INSURANCE

## 2018-09-07 ENCOUNTER — Ambulatory Visit: Admit: 2018-09-07 | Discharge: 2018-10-05 | Payer: PRIVATE HEALTH INSURANCE

## 2018-09-07 DIAGNOSIS — I5022 Chronic systolic (congestive) heart failure: Principal | ICD-10-CM

## 2018-09-07 MED ORDER — PANTOPRAZOLE 20 MG TABLET,DELAYED RELEASE
ORAL_TABLET | 1 refills | 0 days | Status: CP
Start: 2018-09-07 — End: 2018-10-22

## 2018-09-10 DIAGNOSIS — I5022 Chronic systolic (congestive) heart failure: Principal | ICD-10-CM

## 2018-09-12 ENCOUNTER — Ambulatory Visit: Admit: 2018-09-12 | Discharge: 2018-09-13 | Payer: PRIVATE HEALTH INSURANCE

## 2018-09-12 DIAGNOSIS — I5022 Chronic systolic (congestive) heart failure: Principal | ICD-10-CM

## 2018-09-13 MED ORDER — WARFARIN 1 MG TABLET
ORAL_TABLET | 11 refills | 0 days | Status: CP
Start: 2018-09-13 — End: 2018-10-04

## 2018-09-14 DIAGNOSIS — I5022 Chronic systolic (congestive) heart failure: Principal | ICD-10-CM

## 2018-09-17 DIAGNOSIS — I5022 Chronic systolic (congestive) heart failure: Principal | ICD-10-CM

## 2018-09-19 DIAGNOSIS — I5022 Chronic systolic (congestive) heart failure: Principal | ICD-10-CM

## 2018-09-24 DIAGNOSIS — I5022 Chronic systolic (congestive) heart failure: Principal | ICD-10-CM

## 2018-09-25 ENCOUNTER — Ambulatory Visit: Admit: 2018-09-25 | Discharge: 2018-09-26 | Payer: PRIVATE HEALTH INSURANCE

## 2018-09-25 DIAGNOSIS — I5022 Chronic systolic (congestive) heart failure: Principal | ICD-10-CM

## 2018-09-25 MED ORDER — SACUBITRIL 24 MG-VALSARTAN 26 MG TABLET
ORAL_TABLET | Freq: Two times a day (BID) | ORAL | 3 refills | 0 days | Status: CP
Start: 2018-09-25 — End: 2018-11-14

## 2018-09-26 DIAGNOSIS — I5022 Chronic systolic (congestive) heart failure: Principal | ICD-10-CM

## 2018-09-28 DIAGNOSIS — I5022 Chronic systolic (congestive) heart failure: Principal | ICD-10-CM

## 2018-10-01 DIAGNOSIS — I5022 Chronic systolic (congestive) heart failure: Principal | ICD-10-CM

## 2018-10-03 ENCOUNTER — Ambulatory Visit: Admit: 2018-10-03 | Discharge: 2018-10-04 | Payer: PRIVATE HEALTH INSURANCE

## 2018-10-03 ENCOUNTER — Institutional Professional Consult (permissible substitution): Admit: 2018-10-03 | Discharge: 2018-10-04 | Payer: PRIVATE HEALTH INSURANCE

## 2018-10-03 ENCOUNTER — Ambulatory Visit
Admit: 2018-10-03 | Discharge: 2018-10-04 | Payer: PRIVATE HEALTH INSURANCE | Attending: Cardiovascular Disease | Primary: Cardiovascular Disease

## 2018-10-03 DIAGNOSIS — I255 Ischemic cardiomyopathy: Secondary | ICD-10-CM

## 2018-10-03 DIAGNOSIS — D649 Anemia, unspecified: Secondary | ICD-10-CM

## 2018-10-03 DIAGNOSIS — I5022 Chronic systolic (congestive) heart failure: Principal | ICD-10-CM

## 2018-10-03 DIAGNOSIS — N183 Chronic kidney disease, stage 3 (moderate): Secondary | ICD-10-CM

## 2018-10-03 DIAGNOSIS — Z95811 Presence of heart assist device: Principal | ICD-10-CM

## 2018-10-03 DIAGNOSIS — I513 Intracardiac thrombosis, not elsewhere classified: Secondary | ICD-10-CM

## 2018-10-03 DIAGNOSIS — Z01818 Encounter for other preprocedural examination: Principal | ICD-10-CM

## 2018-10-03 DIAGNOSIS — I2510 Atherosclerotic heart disease of native coronary artery without angina pectoris: Secondary | ICD-10-CM

## 2018-10-03 DIAGNOSIS — E871 Hypo-osmolality and hyponatremia: Secondary | ICD-10-CM

## 2018-10-03 DIAGNOSIS — Z95810 Presence of automatic (implantable) cardiac defibrillator: Secondary | ICD-10-CM

## 2018-10-04 ENCOUNTER — Ambulatory Visit
Admit: 2018-10-04 | Discharge: 2018-11-02 | Payer: PRIVATE HEALTH INSURANCE | Attending: Rehabilitative and Restorative Service Providers" | Primary: Rehabilitative and Restorative Service Providers"

## 2018-10-04 ENCOUNTER — Ambulatory Visit: Admit: 2018-10-04 | Discharge: 2018-10-05 | Payer: PRIVATE HEALTH INSURANCE

## 2018-10-04 DIAGNOSIS — I5022 Chronic systolic (congestive) heart failure: Principal | ICD-10-CM

## 2018-10-04 DIAGNOSIS — M542 Cervicalgia: Principal | ICD-10-CM

## 2018-10-04 MED ORDER — WARFARIN 1 MG TABLET
ORAL_TABLET | 11 refills | 0 days | Status: CP
Start: 2018-10-04 — End: 2018-11-15

## 2018-10-08 ENCOUNTER — Institutional Professional Consult (permissible substitution): Admit: 2018-10-08 | Discharge: 2018-11-04 | Payer: PRIVATE HEALTH INSURANCE

## 2018-10-08 ENCOUNTER — Ambulatory Visit: Admit: 2018-10-08 | Discharge: 2018-11-04 | Payer: PRIVATE HEALTH INSURANCE

## 2018-10-08 DIAGNOSIS — I5022 Chronic systolic (congestive) heart failure: Principal | ICD-10-CM

## 2018-10-09 DIAGNOSIS — M542 Cervicalgia: Principal | ICD-10-CM

## 2018-10-10 DIAGNOSIS — I5022 Chronic systolic (congestive) heart failure: Principal | ICD-10-CM

## 2018-10-12 ENCOUNTER — Ambulatory Visit: Admit: 2018-10-12 | Discharge: 2018-10-13 | Payer: PRIVATE HEALTH INSURANCE

## 2018-10-12 DIAGNOSIS — I5022 Chronic systolic (congestive) heart failure: Principal | ICD-10-CM

## 2018-10-12 DIAGNOSIS — M542 Cervicalgia: Principal | ICD-10-CM

## 2018-10-15 DIAGNOSIS — I5022 Chronic systolic (congestive) heart failure: Principal | ICD-10-CM

## 2018-10-20 MED ORDER — COLCHICINE 0.6 MG TABLET
ORAL_TABLET | Freq: Every day | ORAL | 2 refills | 0 days | Status: SS | PRN
Start: 2018-10-20 — End: 2019-04-15

## 2018-10-22 DIAGNOSIS — I5022 Chronic systolic (congestive) heart failure: Principal | ICD-10-CM

## 2018-10-22 MED ORDER — PANTOPRAZOLE 20 MG TABLET,DELAYED RELEASE
ORAL_TABLET | 0 refills | 0 days | Status: CP
Start: 2018-10-22 — End: 2018-10-29

## 2018-10-25 DIAGNOSIS — M542 Cervicalgia: Principal | ICD-10-CM

## 2018-10-26 DIAGNOSIS — I5022 Chronic systolic (congestive) heart failure: Principal | ICD-10-CM

## 2018-10-29 DIAGNOSIS — I5022 Chronic systolic (congestive) heart failure: Principal | ICD-10-CM

## 2018-10-29 MED ORDER — PANTOPRAZOLE 20 MG TABLET,DELAYED RELEASE
ORAL_TABLET | 0 refills | 0 days | Status: CP
Start: 2018-10-29 — End: 2019-01-29

## 2018-10-30 DIAGNOSIS — M542 Cervicalgia: Principal | ICD-10-CM

## 2018-10-31 DIAGNOSIS — I5022 Chronic systolic (congestive) heart failure: Principal | ICD-10-CM

## 2018-11-01 DIAGNOSIS — M542 Cervicalgia: Principal | ICD-10-CM

## 2018-11-05 ENCOUNTER — Institutional Professional Consult (permissible substitution): Admit: 2018-11-05 | Discharge: 2018-12-04 | Payer: PRIVATE HEALTH INSURANCE

## 2018-11-05 ENCOUNTER — Ambulatory Visit: Admit: 2018-11-05 | Discharge: 2018-12-04 | Payer: PRIVATE HEALTH INSURANCE

## 2018-11-05 DIAGNOSIS — I5022 Chronic systolic (congestive) heart failure: Principal | ICD-10-CM

## 2018-11-07 ENCOUNTER — Institutional Professional Consult (permissible substitution): Admit: 2018-11-07 | Discharge: 2018-11-08 | Payer: PRIVATE HEALTH INSURANCE

## 2018-11-07 DIAGNOSIS — I5022 Chronic systolic (congestive) heart failure: Principal | ICD-10-CM

## 2018-11-09 ENCOUNTER — Ambulatory Visit
Admit: 2018-11-09 | Discharge: 2018-12-02 | Payer: PRIVATE HEALTH INSURANCE | Attending: Rehabilitative and Restorative Service Providers" | Primary: Rehabilitative and Restorative Service Providers"

## 2018-11-09 DIAGNOSIS — M542 Cervicalgia: Principal | ICD-10-CM

## 2018-11-09 DIAGNOSIS — I5022 Chronic systolic (congestive) heart failure: Principal | ICD-10-CM

## 2018-11-12 DIAGNOSIS — M542 Cervicalgia: Principal | ICD-10-CM

## 2018-11-14 ENCOUNTER — Ambulatory Visit: Admit: 2018-11-14 | Discharge: 2018-11-14 | Payer: PRIVATE HEALTH INSURANCE

## 2018-11-14 ENCOUNTER — Ambulatory Visit
Admit: 2018-11-14 | Discharge: 2018-11-14 | Payer: PRIVATE HEALTH INSURANCE | Attending: Cardiovascular Disease | Primary: Cardiovascular Disease

## 2018-11-14 DIAGNOSIS — Z95811 Presence of heart assist device: Principal | ICD-10-CM

## 2018-11-14 DIAGNOSIS — I5022 Chronic systolic (congestive) heart failure: Secondary | ICD-10-CM

## 2018-11-14 DIAGNOSIS — N183 Chronic kidney disease, stage 3 (moderate): Secondary | ICD-10-CM

## 2018-11-14 DIAGNOSIS — Z01818 Encounter for other preprocedural examination: Principal | ICD-10-CM

## 2018-11-14 MED ORDER — SACUBITRIL 24 MG-VALSARTAN 26 MG TABLET
ORAL_TABLET | Freq: Two times a day (BID) | ORAL | 3 refills | 0 days | Status: CP
Start: 2018-11-14 — End: 2018-11-15

## 2018-11-15 MED ORDER — WARFARIN 1 MG TABLET
ORAL_TABLET | 11 refills | 0 days | Status: CP
Start: 2018-11-15 — End: 2019-01-12

## 2018-11-15 MED ORDER — SACUBITRIL 49 MG-VALSARTAN 51 MG TABLET
ORAL_TABLET | Freq: Two times a day (BID) | ORAL | 3 refills | 0 days | Status: CP
Start: 2018-11-15 — End: 2018-12-26

## 2018-11-16 DIAGNOSIS — M542 Cervicalgia: Principal | ICD-10-CM

## 2018-11-23 DIAGNOSIS — M542 Cervicalgia: Principal | ICD-10-CM

## 2018-11-27 ENCOUNTER — Ambulatory Visit: Admit: 2018-11-27 | Discharge: 2018-11-28 | Payer: PRIVATE HEALTH INSURANCE

## 2018-11-27 DIAGNOSIS — I5022 Chronic systolic (congestive) heart failure: Principal | ICD-10-CM

## 2018-12-07 ENCOUNTER — Institutional Professional Consult (permissible substitution): Admit: 2018-12-07 | Discharge: 2018-12-08 | Payer: PRIVATE HEALTH INSURANCE

## 2018-12-07 DIAGNOSIS — I513 Intracardiac thrombosis, not elsewhere classified: Secondary | ICD-10-CM

## 2018-12-07 DIAGNOSIS — I2510 Atherosclerotic heart disease of native coronary artery without angina pectoris: Secondary | ICD-10-CM

## 2018-12-07 DIAGNOSIS — I255 Ischemic cardiomyopathy: Secondary | ICD-10-CM

## 2018-12-07 DIAGNOSIS — I5022 Chronic systolic (congestive) heart failure: Principal | ICD-10-CM

## 2018-12-12 ENCOUNTER — Ambulatory Visit
Admit: 2018-12-12 | Discharge: 2019-01-01 | Payer: PRIVATE HEALTH INSURANCE | Attending: Rehabilitative and Restorative Service Providers" | Primary: Rehabilitative and Restorative Service Providers"

## 2018-12-12 ENCOUNTER — Ambulatory Visit: Admit: 2018-12-12 | Discharge: 2018-12-13 | Payer: PRIVATE HEALTH INSURANCE

## 2018-12-12 DIAGNOSIS — M542 Cervicalgia: Principal | ICD-10-CM

## 2018-12-12 DIAGNOSIS — I5022 Chronic systolic (congestive) heart failure: Principal | ICD-10-CM

## 2018-12-12 MED ORDER — FUROSEMIDE 20 MG TABLET
ORAL_TABLET | 1 refills | 0 days | Status: CP
Start: 2018-12-12 — End: 2019-04-01

## 2018-12-25 DIAGNOSIS — I5022 Chronic systolic (congestive) heart failure: Principal | ICD-10-CM

## 2018-12-26 ENCOUNTER — Ambulatory Visit
Admit: 2018-12-26 | Discharge: 2018-12-26 | Payer: PRIVATE HEALTH INSURANCE | Attending: Cardiovascular Disease | Primary: Cardiovascular Disease

## 2018-12-26 ENCOUNTER — Ambulatory Visit: Admit: 2018-12-26 | Discharge: 2018-12-26 | Payer: PRIVATE HEALTH INSURANCE

## 2018-12-26 DIAGNOSIS — I255 Ischemic cardiomyopathy: Principal | ICD-10-CM

## 2018-12-26 DIAGNOSIS — I2510 Atherosclerotic heart disease of native coronary artery without angina pectoris: Principal | ICD-10-CM

## 2018-12-26 DIAGNOSIS — Z95810 Presence of automatic (implantable) cardiac defibrillator: Principal | ICD-10-CM

## 2018-12-26 DIAGNOSIS — K4090 Unilateral inguinal hernia, without obstruction or gangrene, not specified as recurrent: Principal | ICD-10-CM

## 2018-12-26 DIAGNOSIS — M952 Other acquired deformity of head: Principal | ICD-10-CM

## 2018-12-26 DIAGNOSIS — I5022 Chronic systolic (congestive) heart failure: Principal | ICD-10-CM

## 2018-12-26 DIAGNOSIS — I252 Old myocardial infarction: Principal | ICD-10-CM

## 2018-12-26 DIAGNOSIS — M1990 Unspecified osteoarthritis, unspecified site: Principal | ICD-10-CM

## 2018-12-26 DIAGNOSIS — B029 Zoster without complications: Principal | ICD-10-CM

## 2018-12-26 DIAGNOSIS — D649 Anemia, unspecified: Principal | ICD-10-CM

## 2018-12-26 DIAGNOSIS — E785 Hyperlipidemia, unspecified: Principal | ICD-10-CM

## 2018-12-26 DIAGNOSIS — Z7901 Long term (current) use of anticoagulants: Principal | ICD-10-CM

## 2018-12-26 DIAGNOSIS — I130 Hypertensive heart and chronic kidney disease with heart failure and stage 1 through stage 4 chronic kidney disease, or unspecified chronic kidney disease: Principal | ICD-10-CM

## 2018-12-26 DIAGNOSIS — I272 Pulmonary hypertension, unspecified: Principal | ICD-10-CM

## 2018-12-26 DIAGNOSIS — K449 Diaphragmatic hernia without obstruction or gangrene: Principal | ICD-10-CM

## 2018-12-26 DIAGNOSIS — N183 Chronic kidney disease, stage 3 (moderate): Principal | ICD-10-CM

## 2018-12-26 DIAGNOSIS — I5084 End stage heart failure: Principal | ICD-10-CM

## 2018-12-26 DIAGNOSIS — N189 Chronic kidney disease, unspecified: Principal | ICD-10-CM

## 2018-12-26 DIAGNOSIS — M419 Scoliosis, unspecified: Principal | ICD-10-CM

## 2018-12-26 DIAGNOSIS — Z8249 Family history of ischemic heart disease and other diseases of the circulatory system: Principal | ICD-10-CM

## 2018-12-26 DIAGNOSIS — I213 ST elevation (STEMI) myocardial infarction of unspecified site: Principal | ICD-10-CM

## 2018-12-26 DIAGNOSIS — Z95811 Presence of heart assist device: Principal | ICD-10-CM

## 2018-12-26 DIAGNOSIS — M109 Gout, unspecified: Principal | ICD-10-CM

## 2018-12-26 DIAGNOSIS — D509 Iron deficiency anemia, unspecified: Principal | ICD-10-CM

## 2018-12-26 DIAGNOSIS — C4491 Basal cell carcinoma of skin, unspecified: Principal | ICD-10-CM

## 2018-12-26 DIAGNOSIS — M542 Cervicalgia: Principal | ICD-10-CM

## 2018-12-26 DIAGNOSIS — I499 Cardiac arrhythmia, unspecified: Principal | ICD-10-CM

## 2018-12-26 DIAGNOSIS — I4891 Unspecified atrial fibrillation: Principal | ICD-10-CM

## 2018-12-26 DIAGNOSIS — I10 Essential (primary) hypertension: Principal | ICD-10-CM

## 2018-12-26 MED ORDER — METOPROLOL SUCCINATE ER 25 MG TABLET,EXTENDED RELEASE 24 HR
ORAL_TABLET | Freq: Every day | ORAL | 11 refills | 0.00000 days | Status: SS
Start: 2018-12-26 — End: 2019-04-15

## 2018-12-26 MED ORDER — ENTRESTO 97 MG-103 MG TABLET
ORAL_TABLET | Freq: Two times a day (BID) | ORAL | 11 refills | 0.00000 days | Status: CP
Start: 2018-12-26 — End: 2019-04-15

## 2019-01-01 DIAGNOSIS — I5022 Chronic systolic (congestive) heart failure: Principal | ICD-10-CM

## 2019-01-02 ENCOUNTER — Other Ambulatory Visit: Admit: 2019-01-02 | Discharge: 2019-01-03 | Payer: PRIVATE HEALTH INSURANCE

## 2019-01-02 DIAGNOSIS — I5022 Chronic systolic (congestive) heart failure: Principal | ICD-10-CM

## 2019-01-02 DIAGNOSIS — Z95811 Presence of heart assist device: Principal | ICD-10-CM

## 2019-01-09 ENCOUNTER — Ambulatory Visit: Admit: 2019-01-09 | Discharge: 2019-01-10 | Payer: PRIVATE HEALTH INSURANCE

## 2019-01-09 DIAGNOSIS — Z95811 Presence of heart assist device: Principal | ICD-10-CM

## 2019-01-10 ENCOUNTER — Ambulatory Visit: Admit: 2019-01-10 | Discharge: 2019-01-11 | Payer: PRIVATE HEALTH INSURANCE

## 2019-01-10 DIAGNOSIS — Z95811 Presence of heart assist device: Principal | ICD-10-CM

## 2019-01-10 DIAGNOSIS — I2510 Atherosclerotic heart disease of native coronary artery without angina pectoris: Principal | ICD-10-CM

## 2019-01-10 DIAGNOSIS — I255 Ischemic cardiomyopathy: Principal | ICD-10-CM

## 2019-01-10 DIAGNOSIS — E782 Mixed hyperlipidemia: Principal | ICD-10-CM

## 2019-01-12 DIAGNOSIS — I5022 Chronic systolic (congestive) heart failure: Principal | ICD-10-CM

## 2019-01-12 DIAGNOSIS — Z95811 Presence of heart assist device: Principal | ICD-10-CM

## 2019-01-12 MED ORDER — WARFARIN 1 MG TABLET
ORAL_TABLET | 11 refills | 0 days | Status: SS
Start: 2019-01-12 — End: 2019-04-15

## 2019-01-15 DIAGNOSIS — I5022 Chronic systolic (congestive) heart failure: Principal | ICD-10-CM

## 2019-01-18 ENCOUNTER — Ambulatory Visit: Admit: 2019-01-18 | Discharge: 2019-01-19 | Payer: PRIVATE HEALTH INSURANCE

## 2019-01-18 DIAGNOSIS — Z95811 Presence of heart assist device: Principal | ICD-10-CM

## 2019-01-18 DIAGNOSIS — I5022 Chronic systolic (congestive) heart failure: Principal | ICD-10-CM

## 2019-01-21 ENCOUNTER — Institutional Professional Consult (permissible substitution)
Admit: 2019-01-21 | Discharge: 2019-01-22 | Payer: PRIVATE HEALTH INSURANCE | Attending: Cardiovascular Disease | Primary: Cardiovascular Disease

## 2019-01-21 DIAGNOSIS — Z95811 Presence of heart assist device: Principal | ICD-10-CM

## 2019-01-21 DIAGNOSIS — I213 ST elevation (STEMI) myocardial infarction of unspecified site: Principal | ICD-10-CM

## 2019-01-21 DIAGNOSIS — C4491 Basal cell carcinoma of skin, unspecified: Principal | ICD-10-CM

## 2019-01-21 DIAGNOSIS — M1990 Unspecified osteoarthritis, unspecified site: Principal | ICD-10-CM

## 2019-01-21 DIAGNOSIS — M109 Gout, unspecified: Principal | ICD-10-CM

## 2019-01-21 DIAGNOSIS — I5042 Chronic combined systolic (congestive) and diastolic (congestive) heart failure: Principal | ICD-10-CM

## 2019-01-21 DIAGNOSIS — I5022 Chronic systolic (congestive) heart failure: Principal | ICD-10-CM

## 2019-01-21 DIAGNOSIS — B029 Zoster without complications: Principal | ICD-10-CM

## 2019-01-21 DIAGNOSIS — I10 Essential (primary) hypertension: Principal | ICD-10-CM

## 2019-01-21 DIAGNOSIS — K449 Diaphragmatic hernia without obstruction or gangrene: Principal | ICD-10-CM

## 2019-01-21 DIAGNOSIS — N189 Chronic kidney disease, unspecified: Principal | ICD-10-CM

## 2019-01-21 DIAGNOSIS — D649 Anemia, unspecified: Principal | ICD-10-CM

## 2019-01-21 DIAGNOSIS — I2510 Atherosclerotic heart disease of native coronary artery without angina pectoris: Principal | ICD-10-CM

## 2019-01-21 DIAGNOSIS — E785 Hyperlipidemia, unspecified: Principal | ICD-10-CM

## 2019-01-21 DIAGNOSIS — K4090 Unilateral inguinal hernia, without obstruction or gangrene, not specified as recurrent: Principal | ICD-10-CM

## 2019-01-21 DIAGNOSIS — M419 Scoliosis, unspecified: Principal | ICD-10-CM

## 2019-01-21 DIAGNOSIS — I4891 Unspecified atrial fibrillation: Principal | ICD-10-CM

## 2019-01-29 MED ORDER — PANTOPRAZOLE 20 MG TABLET,DELAYED RELEASE
ORAL_TABLET | 0 refills | 0 days | Status: CP
Start: 2019-01-29 — End: 2019-02-13

## 2019-01-29 MED ORDER — ATORVASTATIN 80 MG TABLET
ORAL_TABLET | Freq: Every day | ORAL | 3 refills | 0.00000 days | Status: CP
Start: 2019-01-29 — End: ?

## 2019-02-05 ENCOUNTER — Ambulatory Visit: Admit: 2019-02-05 | Discharge: 2019-02-06 | Payer: PRIVATE HEALTH INSURANCE

## 2019-02-05 DIAGNOSIS — I5022 Chronic systolic (congestive) heart failure: Principal | ICD-10-CM

## 2019-02-05 MED ORDER — WARFARIN 1 MG TABLET
ORAL_TABLET | 0 refills | 0 days
Start: 2019-02-05 — End: 2019-03-01

## 2019-02-06 MED ORDER — LOSARTAN 25 MG TABLET
ORAL_TABLET | Freq: Every day | ORAL | 1 refills | 0 days | Status: CP
Start: 2019-02-06 — End: 2019-03-01

## 2019-02-06 MED ORDER — SPIRONOLACTONE 25 MG TABLET
ORAL_TABLET | 0 refills | 0 days | Status: CP
Start: 2019-02-06 — End: 2019-03-01

## 2019-02-13 ENCOUNTER — Institutional Professional Consult (permissible substitution): Admit: 2019-02-13 | Discharge: 2019-02-14 | Payer: PRIVATE HEALTH INSURANCE | Attending: Family | Primary: Family

## 2019-02-13 DIAGNOSIS — Z86010 Personal history of colonic polyps: Secondary | ICD-10-CM

## 2019-02-13 DIAGNOSIS — K2970 Gastritis, unspecified, without bleeding: Secondary | ICD-10-CM

## 2019-02-13 DIAGNOSIS — I5022 Chronic systolic (congestive) heart failure: Secondary | ICD-10-CM

## 2019-02-13 DIAGNOSIS — I255 Ischemic cardiomyopathy: Secondary | ICD-10-CM

## 2019-02-13 DIAGNOSIS — N183 Chronic kidney disease, stage 3 (moderate): Secondary | ICD-10-CM

## 2019-02-13 DIAGNOSIS — Z95810 Presence of automatic (implantable) cardiac defibrillator: Secondary | ICD-10-CM

## 2019-02-13 DIAGNOSIS — R12 Heartburn: Principal | ICD-10-CM

## 2019-02-13 DIAGNOSIS — Z7902 Long term (current) use of antithrombotics/antiplatelets: Secondary | ICD-10-CM

## 2019-02-13 DIAGNOSIS — Z95811 Presence of heart assist device: Secondary | ICD-10-CM

## 2019-02-13 DIAGNOSIS — Z7901 Long term (current) use of anticoagulants: Secondary | ICD-10-CM

## 2019-02-13 DIAGNOSIS — I2510 Atherosclerotic heart disease of native coronary artery without angina pectoris: Secondary | ICD-10-CM

## 2019-02-13 MED ORDER — PANTOPRAZOLE 20 MG TABLET,DELAYED RELEASE
ORAL_TABLET | Freq: Every day | ORAL | 2 refills | 0 days | Status: CP
Start: 2019-02-13 — End: ?

## 2019-02-14 ENCOUNTER — Ambulatory Visit: Admit: 2019-02-14 | Discharge: 2019-02-15 | Payer: PRIVATE HEALTH INSURANCE

## 2019-02-14 DIAGNOSIS — I5022 Chronic systolic (congestive) heart failure: Principal | ICD-10-CM

## 2019-03-04 ENCOUNTER — Ambulatory Visit: Admit: 2019-03-04 | Discharge: 2019-03-05 | Payer: PRIVATE HEALTH INSURANCE

## 2019-03-04 ENCOUNTER — Institutional Professional Consult (permissible substitution)
Admit: 2019-03-04 | Discharge: 2019-03-05 | Payer: PRIVATE HEALTH INSURANCE | Attending: Adult Health | Primary: Adult Health

## 2019-03-04 DIAGNOSIS — Z95811 Presence of heart assist device: Secondary | ICD-10-CM

## 2019-03-04 DIAGNOSIS — I5022 Chronic systolic (congestive) heart failure: Principal | ICD-10-CM

## 2019-03-04 DIAGNOSIS — Z7901 Long term (current) use of anticoagulants: Secondary | ICD-10-CM

## 2019-03-12 ENCOUNTER — Ambulatory Visit: Admit: 2019-03-12 | Discharge: 2019-03-13 | Payer: PRIVATE HEALTH INSURANCE

## 2019-03-12 DIAGNOSIS — I5022 Chronic systolic (congestive) heart failure: Principal | ICD-10-CM

## 2019-03-25 ENCOUNTER — Ambulatory Visit: Admit: 2019-03-25 | Discharge: 2019-03-26 | Payer: PRIVATE HEALTH INSURANCE

## 2019-03-25 DIAGNOSIS — I5022 Chronic systolic (congestive) heart failure: Principal | ICD-10-CM

## 2019-04-01 ENCOUNTER — Ambulatory Visit: Admit: 2019-04-01 | Discharge: 2019-04-02 | Payer: PRIVATE HEALTH INSURANCE

## 2019-04-01 DIAGNOSIS — Z95811 Presence of heart assist device: Principal | ICD-10-CM

## 2019-04-01 MED ORDER — FUROSEMIDE 20 MG TABLET
ORAL_TABLET | Freq: Every day | ORAL | 1 refills | 0 days | Status: CP | PRN
Start: 2019-04-01 — End: ?

## 2019-04-08 ENCOUNTER — Ambulatory Visit: Admit: 2019-04-08 | Discharge: 2019-04-09 | Payer: PRIVATE HEALTH INSURANCE

## 2019-04-08 DIAGNOSIS — I5022 Chronic systolic (congestive) heart failure: Principal | ICD-10-CM

## 2019-04-10 ENCOUNTER — Ambulatory Visit: Admit: 2019-04-10 | Discharge: 2019-04-11 | Payer: PRIVATE HEALTH INSURANCE

## 2019-04-10 DIAGNOSIS — I10 Essential (primary) hypertension: Secondary | ICD-10-CM | POA: Diagnosis present

## 2019-04-10 DIAGNOSIS — I5022 Chronic systolic (congestive) heart failure: Principal | ICD-10-CM

## 2019-04-10 MED ORDER — PHYTONADIONE (VITAMIN K1) 5 MG TABLET
ORAL_TABLET | 0 refills | 0 days | Status: SS
Start: 2019-04-10 — End: 2019-04-15

## 2019-04-12 ENCOUNTER — Ambulatory Visit: Admit: 2019-04-12 | Discharge: 2019-04-13 | Payer: PRIVATE HEALTH INSURANCE

## 2019-04-12 DIAGNOSIS — I5022 Chronic systolic (congestive) heart failure: Principal | ICD-10-CM

## 2019-04-13 ENCOUNTER — Ambulatory Visit
Admit: 2019-04-13 | Discharge: 2019-04-15 | Disposition: A | Discharge status: Home / Self Care | Payer: PRIVATE HEALTH INSURANCE

## 2019-04-13 DIAGNOSIS — N179 Acute kidney failure, unspecified: Principal | ICD-10-CM

## 2019-04-15 DIAGNOSIS — N179 Acute kidney failure, unspecified: Principal | ICD-10-CM

## 2019-04-15 MED ORDER — HYDRALAZINE 10 MG TABLET
ORAL_TABLET | Freq: Three times a day (TID) | ORAL | 3 refills | 0.00000 days | Status: CP
Start: 2019-04-15 — End: 2019-05-06
  Filled 2019-04-15: qty 90, 30d supply, fill #0

## 2019-04-15 MED ORDER — WARFARIN 1 MG TABLET
ORAL_TABLET | 11 refills | 0 days | Status: CP
Start: 2019-04-15 — End: 2019-06-05

## 2019-04-15 MED FILL — HYDRALAZINE 10 MG TABLET: 30 days supply | Qty: 90 | Fill #0 | Status: AC

## 2019-04-17 ENCOUNTER — Ambulatory Visit: Admit: 2019-04-17 | Discharge: 2019-04-18 | Payer: PRIVATE HEALTH INSURANCE

## 2019-04-17 DIAGNOSIS — I5022 Chronic systolic (congestive) heart failure: Principal | ICD-10-CM

## 2019-04-22 ENCOUNTER — Ambulatory Visit: Admit: 2019-04-22 | Discharge: 2019-04-23 | Payer: PRIVATE HEALTH INSURANCE

## 2019-04-22 DIAGNOSIS — Z95811 Presence of heart assist device: Secondary | ICD-10-CM

## 2019-04-22 DIAGNOSIS — I5022 Chronic systolic (congestive) heart failure: Principal | ICD-10-CM

## 2019-04-22 DIAGNOSIS — Z01818 Encounter for other preprocedural examination: Principal | ICD-10-CM

## 2019-04-24 ENCOUNTER — Ambulatory Visit
Admit: 2019-04-24 | Discharge: 2019-04-24 | Payer: PRIVATE HEALTH INSURANCE | Attending: Cardiovascular Disease | Primary: Cardiovascular Disease

## 2019-04-24 ENCOUNTER — Ambulatory Visit: Admit: 2019-04-24 | Discharge: 2019-04-24 | Payer: PRIVATE HEALTH INSURANCE

## 2019-04-24 ENCOUNTER — Institutional Professional Consult (permissible substitution): Admit: 2019-04-24 | Discharge: 2019-04-24 | Payer: PRIVATE HEALTH INSURANCE

## 2019-04-24 DIAGNOSIS — I2510 Atherosclerotic heart disease of native coronary artery without angina pectoris: Secondary | ICD-10-CM

## 2019-04-24 DIAGNOSIS — I255 Ischemic cardiomyopathy: Secondary | ICD-10-CM

## 2019-04-24 DIAGNOSIS — Z95811 Presence of heart assist device: Principal | ICD-10-CM

## 2019-04-24 DIAGNOSIS — F1010 Alcohol abuse, uncomplicated: Secondary | ICD-10-CM

## 2019-04-24 DIAGNOSIS — I5022 Chronic systolic (congestive) heart failure: Secondary | ICD-10-CM

## 2019-04-24 DIAGNOSIS — I10 Essential (primary) hypertension: Secondary | ICD-10-CM

## 2019-04-24 DIAGNOSIS — D649 Anemia, unspecified: Secondary | ICD-10-CM

## 2019-05-02 ENCOUNTER — Ambulatory Visit: Admit: 2019-05-02 | Discharge: 2019-05-03 | Payer: PRIVATE HEALTH INSURANCE

## 2019-05-02 DIAGNOSIS — Z95811 Presence of heart assist device: Secondary | ICD-10-CM

## 2019-05-02 DIAGNOSIS — I5022 Chronic systolic (congestive) heart failure: Principal | ICD-10-CM

## 2019-05-02 MED ORDER — ENOXAPARIN 60 MG/0.6 ML SUBCUTANEOUS SYRINGE
INJECTION | Freq: Two times a day (BID) | SUBCUTANEOUS | 1 refills | 0 days | Status: CP
Start: 2019-05-02 — End: 2019-05-09

## 2019-05-06 ENCOUNTER — Ambulatory Visit
Admit: 2019-05-06 | Discharge: 2019-05-06 | Payer: PRIVATE HEALTH INSURANCE | Attending: Cardiovascular Disease | Primary: Cardiovascular Disease

## 2019-05-06 ENCOUNTER — Ambulatory Visit: Admit: 2019-05-06 | Discharge: 2019-05-06 | Payer: PRIVATE HEALTH INSURANCE

## 2019-05-06 DIAGNOSIS — I255 Ischemic cardiomyopathy: Secondary | ICD-10-CM

## 2019-05-06 DIAGNOSIS — Z95811 Presence of heart assist device: Secondary | ICD-10-CM

## 2019-05-06 DIAGNOSIS — N183 Chronic kidney disease, stage 3 (moderate): Secondary | ICD-10-CM

## 2019-05-06 DIAGNOSIS — I5022 Chronic systolic (congestive) heart failure: Secondary | ICD-10-CM

## 2019-05-06 DIAGNOSIS — F1010 Alcohol abuse, uncomplicated: Secondary | ICD-10-CM

## 2019-05-06 DIAGNOSIS — I2510 Atherosclerotic heart disease of native coronary artery without angina pectoris: Secondary | ICD-10-CM

## 2019-05-06 MED ORDER — MIRTAZAPINE 15 MG TABLET
ORAL_TABLET | Freq: Every evening | ORAL | 3 refills | 90 days | Status: CP
Start: 2019-05-06 — End: 2019-06-10

## 2019-05-06 MED ORDER — HYDRALAZINE 10 MG TABLET
ORAL_TABLET | Freq: Three times a day (TID) | ORAL | 4 refills | 30.00000 days | Status: CP
Start: 2019-05-06 — End: ?

## 2019-05-09 ENCOUNTER — Ambulatory Visit: Admit: 2019-05-09 | Discharge: 2019-05-10 | Payer: PRIVATE HEALTH INSURANCE

## 2019-05-09 DIAGNOSIS — Z95811 Presence of heart assist device: Principal | ICD-10-CM

## 2019-05-09 DIAGNOSIS — I5022 Chronic systolic (congestive) heart failure: Secondary | ICD-10-CM

## 2019-05-09 MED ORDER — ENOXAPARIN 60 MG/0.6 ML SUBCUTANEOUS SYRINGE
INJECTION | Freq: Two times a day (BID) | SUBCUTANEOUS | 5 refills | 0.00000 days | Status: CP
Start: 2019-05-09 — End: ?

## 2019-05-15 ENCOUNTER — Ambulatory Visit: Admit: 2019-05-15 | Discharge: 2019-05-16 | Payer: PRIVATE HEALTH INSURANCE

## 2019-05-15 DIAGNOSIS — I5022 Chronic systolic (congestive) heart failure: Principal | ICD-10-CM

## 2019-05-22 ENCOUNTER — Ambulatory Visit: Admit: 2019-05-22 | Discharge: 2019-05-23 | Payer: PRIVATE HEALTH INSURANCE

## 2019-05-22 DIAGNOSIS — Z95811 Presence of heart assist device: Principal | ICD-10-CM

## 2019-05-22 DIAGNOSIS — I5022 Chronic systolic (congestive) heart failure: Secondary | ICD-10-CM

## 2019-05-29 ENCOUNTER — Ambulatory Visit: Admit: 2019-05-29 | Discharge: 2019-05-30 | Payer: PRIVATE HEALTH INSURANCE

## 2019-05-29 DIAGNOSIS — Z95811 Presence of heart assist device: Principal | ICD-10-CM

## 2019-06-05 ENCOUNTER — Ambulatory Visit: Admit: 2019-06-05 | Discharge: 2019-06-06 | Payer: PRIVATE HEALTH INSURANCE

## 2019-06-05 DIAGNOSIS — I5022 Chronic systolic (congestive) heart failure: Principal | ICD-10-CM

## 2019-06-05 MED ORDER — WARFARIN 1 MG TABLET
ORAL_TABLET | 11 refills | 0 days | Status: CP
Start: 2019-06-05 — End: 2019-06-13

## 2019-06-10 ENCOUNTER — Ambulatory Visit: Admit: 2019-06-10 | Discharge: 2019-06-11 | Payer: PRIVATE HEALTH INSURANCE

## 2019-06-10 ENCOUNTER — Ambulatory Visit
Admit: 2019-06-10 | Discharge: 2019-06-11 | Payer: PRIVATE HEALTH INSURANCE | Attending: Cardiovascular Disease | Primary: Cardiovascular Disease

## 2019-06-10 DIAGNOSIS — N183 Chronic kidney disease, stage 3 (moderate): Secondary | ICD-10-CM

## 2019-06-10 DIAGNOSIS — D649 Anemia, unspecified: Secondary | ICD-10-CM

## 2019-06-10 DIAGNOSIS — I10 Essential (primary) hypertension: Secondary | ICD-10-CM

## 2019-06-10 DIAGNOSIS — I255 Ischemic cardiomyopathy: Secondary | ICD-10-CM

## 2019-06-10 DIAGNOSIS — Z95811 Presence of heart assist device: Principal | ICD-10-CM

## 2019-06-10 DIAGNOSIS — I5022 Chronic systolic (congestive) heart failure: Secondary | ICD-10-CM

## 2019-06-10 DIAGNOSIS — I2510 Atherosclerotic heart disease of native coronary artery without angina pectoris: Secondary | ICD-10-CM

## 2019-06-10 DIAGNOSIS — R945 Abnormal results of liver function studies: Secondary | ICD-10-CM

## 2019-06-10 DIAGNOSIS — Z01818 Encounter for other preprocedural examination: Principal | ICD-10-CM

## 2019-06-13 ENCOUNTER — Ambulatory Visit: Admit: 2019-06-13 | Discharge: 2019-06-14 | Payer: PRIVATE HEALTH INSURANCE

## 2019-06-13 DIAGNOSIS — Z95811 Presence of heart assist device: Principal | ICD-10-CM

## 2019-06-13 MED ORDER — WARFARIN 1 MG TABLET
ORAL_TABLET | 11 refills | 0 days
Start: 2019-06-13 — End: 2019-06-20

## 2019-06-19 ENCOUNTER — Ambulatory Visit: Admit: 2019-06-19 | Discharge: 2019-06-20 | Payer: PRIVATE HEALTH INSURANCE

## 2019-06-19 DIAGNOSIS — Z01818 Encounter for other preprocedural examination: Principal | ICD-10-CM

## 2019-06-19 DIAGNOSIS — I5022 Chronic systolic (congestive) heart failure: Principal | ICD-10-CM

## 2019-06-20 MED ORDER — WARFARIN 1 MG TABLET
ORAL_TABLET | 11 refills | 0 days
Start: 2019-06-20 — End: ?

## 2019-06-21 MED ORDER — ALLOPURINOL 100 MG TABLET
ORAL_TABLET | 3 refills | 0 days | Status: CP
Start: 2019-06-21 — End: ?

## 2019-06-27 ENCOUNTER — Ambulatory Visit: Admit: 2019-06-27 | Discharge: 2019-06-28 | Payer: PRIVATE HEALTH INSURANCE

## 2019-06-27 DIAGNOSIS — Z95811 Presence of heart assist device: Secondary | ICD-10-CM

## 2019-06-27 DIAGNOSIS — I5022 Chronic systolic (congestive) heart failure: Secondary | ICD-10-CM

## 2019-07-02 DIAGNOSIS — I5022 Chronic systolic (congestive) heart failure: Secondary | ICD-10-CM

## 2019-07-03 ENCOUNTER — Ambulatory Visit: Admit: 2019-07-03 | Discharge: 2019-07-04 | Payer: PRIVATE HEALTH INSURANCE

## 2019-07-03 DIAGNOSIS — Z95811 Presence of heart assist device: Secondary | ICD-10-CM

## 2019-07-03 DIAGNOSIS — I5022 Chronic systolic (congestive) heart failure: Secondary | ICD-10-CM

## 2019-07-03 MED ORDER — WARFARIN 1 MG TABLET
ORAL_TABLET | 11 refills | 0 days
Start: 2019-07-03 — End: ?

## 2019-07-05 DIAGNOSIS — I5022 Chronic systolic (congestive) heart failure: Secondary | ICD-10-CM

## 2019-07-08 ENCOUNTER — Ambulatory Visit
Admit: 2019-07-08 | Discharge: 2019-07-08 | Payer: PRIVATE HEALTH INSURANCE | Attending: Cardiovascular Disease | Primary: Cardiovascular Disease

## 2019-07-08 ENCOUNTER — Institutional Professional Consult (permissible substitution): Admit: 2019-07-08 | Discharge: 2019-07-08 | Payer: PRIVATE HEALTH INSURANCE

## 2019-07-08 ENCOUNTER — Ambulatory Visit: Admit: 2019-07-08 | Discharge: 2019-07-08 | Payer: PRIVATE HEALTH INSURANCE

## 2019-07-08 DIAGNOSIS — I5022 Chronic systolic (congestive) heart failure: Secondary | ICD-10-CM

## 2019-07-08 DIAGNOSIS — Z01818 Encounter for other preprocedural examination: Secondary | ICD-10-CM

## 2019-07-08 DIAGNOSIS — Z95811 Presence of heart assist device: Secondary | ICD-10-CM

## 2019-07-08 MED ORDER — ENTRESTO 24 MG-26 MG TABLET
ORAL_TABLET | Freq: Two times a day (BID) | ORAL | 11 refills | 15.00000 days | Status: CP
Start: 2019-07-08 — End: ?

## 2019-07-10 DIAGNOSIS — I5022 Chronic systolic (congestive) heart failure: Secondary | ICD-10-CM

## 2019-07-17 DIAGNOSIS — I5022 Chronic systolic (congestive) heart failure: Secondary | ICD-10-CM

## 2019-07-17 DIAGNOSIS — Z95811 Presence of heart assist device: Secondary | ICD-10-CM

## 2019-07-18 ENCOUNTER — Ambulatory Visit: Admit: 2019-07-18 | Discharge: 2019-07-19 | Payer: PRIVATE HEALTH INSURANCE

## 2019-07-18 DIAGNOSIS — I5022 Chronic systolic (congestive) heart failure: Secondary | ICD-10-CM

## 2019-07-18 DIAGNOSIS — Z95811 Presence of heart assist device: Secondary | ICD-10-CM

## 2019-07-23 DIAGNOSIS — I5022 Chronic systolic (congestive) heart failure: Secondary | ICD-10-CM

## 2019-07-24 ENCOUNTER — Ambulatory Visit: Admit: 2019-07-24 | Discharge: 2019-07-25 | Payer: PRIVATE HEALTH INSURANCE

## 2019-07-24 DIAGNOSIS — Z95811 Presence of heart assist device: Secondary | ICD-10-CM

## 2019-07-24 DIAGNOSIS — I5022 Chronic systolic (congestive) heart failure: Secondary | ICD-10-CM

## 2019-07-29 DIAGNOSIS — Z95811 Presence of heart assist device: Secondary | ICD-10-CM

## 2019-07-31 DIAGNOSIS — I5022 Chronic systolic (congestive) heart failure: Secondary | ICD-10-CM

## 2019-08-01 ENCOUNTER — Ambulatory Visit: Admit: 2019-08-01 | Discharge: 2019-08-02 | Payer: PRIVATE HEALTH INSURANCE

## 2019-08-01 DIAGNOSIS — Z95811 Presence of heart assist device: Secondary | ICD-10-CM

## 2019-08-01 DIAGNOSIS — I5022 Chronic systolic (congestive) heart failure: Secondary | ICD-10-CM

## 2019-08-06 DIAGNOSIS — I5022 Chronic systolic (congestive) heart failure: Principal | ICD-10-CM

## 2019-08-07 ENCOUNTER — Ambulatory Visit: Admit: 2019-08-07 | Discharge: 2019-08-08 | Payer: PRIVATE HEALTH INSURANCE

## 2019-08-07 DIAGNOSIS — I5022 Chronic systolic (congestive) heart failure: Principal | ICD-10-CM

## 2019-08-07 DIAGNOSIS — E782 Mixed hyperlipidemia: Principal | ICD-10-CM

## 2019-08-07 DIAGNOSIS — Z125 Encounter for screening for malignant neoplasm of prostate: Principal | ICD-10-CM

## 2019-08-07 DIAGNOSIS — Z95811 Presence of heart assist device: Principal | ICD-10-CM

## 2019-08-09 DIAGNOSIS — I5022 Chronic systolic (congestive) heart failure: Principal | ICD-10-CM

## 2019-08-12 ENCOUNTER — Ambulatory Visit
Admit: 2019-08-12 | Discharge: 2019-08-12 | Payer: PRIVATE HEALTH INSURANCE | Attending: Cardiovascular Disease | Primary: Cardiovascular Disease

## 2019-08-12 ENCOUNTER — Ambulatory Visit: Admit: 2019-08-12 | Discharge: 2019-08-12 | Payer: PRIVATE HEALTH INSURANCE

## 2019-08-12 ENCOUNTER — Ambulatory Visit: Admit: 2019-08-12 | Discharge: 2019-08-13 | Payer: PRIVATE HEALTH INSURANCE

## 2019-08-12 DIAGNOSIS — E782 Mixed hyperlipidemia: Principal | ICD-10-CM

## 2019-08-12 DIAGNOSIS — I5022 Chronic systolic (congestive) heart failure: Principal | ICD-10-CM

## 2019-08-12 DIAGNOSIS — R079 Chest pain, unspecified: Principal | ICD-10-CM

## 2019-08-12 DIAGNOSIS — Z125 Encounter for screening for malignant neoplasm of prostate: Principal | ICD-10-CM

## 2019-08-12 DIAGNOSIS — Z95811 Presence of heart assist device: Principal | ICD-10-CM

## 2019-08-14 ENCOUNTER — Ambulatory Visit: Admit: 2019-08-14 | Discharge: 2019-08-15 | Payer: PRIVATE HEALTH INSURANCE

## 2019-08-14 DIAGNOSIS — Z125 Encounter for screening for malignant neoplasm of prostate: Principal | ICD-10-CM

## 2019-08-14 DIAGNOSIS — E782 Mixed hyperlipidemia: Principal | ICD-10-CM

## 2019-08-14 DIAGNOSIS — I2510 Atherosclerotic heart disease of native coronary artery without angina pectoris: Principal | ICD-10-CM

## 2019-08-15 DIAGNOSIS — R972 Elevated prostate specific antigen [PSA]: Principal | ICD-10-CM

## 2019-08-19 DIAGNOSIS — Z95811 Presence of heart assist device: Principal | ICD-10-CM

## 2019-08-29 ENCOUNTER — Ambulatory Visit
Admit: 2019-08-29 | Discharge: 2019-08-30 | Payer: PRIVATE HEALTH INSURANCE | Attending: Cardiovascular Disease | Primary: Cardiovascular Disease

## 2019-08-29 DIAGNOSIS — E782 Mixed hyperlipidemia: Principal | ICD-10-CM

## 2019-08-29 DIAGNOSIS — I2510 Atherosclerotic heart disease of native coronary artery without angina pectoris: Principal | ICD-10-CM

## 2019-08-29 DIAGNOSIS — Z95811 Presence of heart assist device: Principal | ICD-10-CM

## 2019-08-29 DIAGNOSIS — Z95810 Presence of automatic (implantable) cardiac defibrillator: Principal | ICD-10-CM

## 2019-08-29 DIAGNOSIS — I513 Intracardiac thrombosis, not elsewhere classified: Principal | ICD-10-CM

## 2019-08-29 DIAGNOSIS — F1010 Alcohol abuse, uncomplicated: Principal | ICD-10-CM

## 2019-08-29 DIAGNOSIS — N183 Chronic kidney disease, stage 3 (moderate): Principal | ICD-10-CM

## 2019-08-29 DIAGNOSIS — I255 Ischemic cardiomyopathy: Principal | ICD-10-CM

## 2019-08-29 DIAGNOSIS — I10 Essential (primary) hypertension: Principal | ICD-10-CM

## 2019-08-29 DIAGNOSIS — D508 Other iron deficiency anemias: Principal | ICD-10-CM

## 2019-08-29 DIAGNOSIS — I5022 Chronic systolic (congestive) heart failure: Principal | ICD-10-CM

## 2019-08-30 DIAGNOSIS — I5022 Chronic systolic (congestive) heart failure: Principal | ICD-10-CM

## 2019-09-02 ENCOUNTER — Ambulatory Visit: Admit: 2019-09-02 | Discharge: 2019-09-03 | Payer: PRIVATE HEALTH INSURANCE

## 2019-09-02 DIAGNOSIS — Z95811 Presence of heart assist device: Principal | ICD-10-CM

## 2019-09-04 ENCOUNTER — Ambulatory Visit: Admit: 2019-09-04 | Discharge: 2019-09-04 | Payer: PRIVATE HEALTH INSURANCE

## 2019-09-04 ENCOUNTER — Institutional Professional Consult (permissible substitution): Admit: 2019-09-04 | Discharge: 2019-09-04 | Payer: PRIVATE HEALTH INSURANCE

## 2019-09-04 DIAGNOSIS — I5022 Chronic systolic (congestive) heart failure: Principal | ICD-10-CM

## 2019-09-04 DIAGNOSIS — B999 Unspecified infectious disease: Principal | ICD-10-CM

## 2019-09-04 DIAGNOSIS — Z95811 Presence of heart assist device: Principal | ICD-10-CM

## 2019-09-11 DIAGNOSIS — B999 Unspecified infectious disease: Principal | ICD-10-CM

## 2019-09-11 MED ORDER — DOXYCYCLINE HYCLATE 100 MG TABLET
ORAL_TABLET | Freq: Two times a day (BID) | ORAL | 0 refills | 14 days | Status: CP
Start: 2019-09-11 — End: 2019-09-25

## 2019-09-12 DIAGNOSIS — I5022 Chronic systolic (congestive) heart failure: Principal | ICD-10-CM

## 2019-09-17 DIAGNOSIS — Z95811 Presence of heart assist device: Principal | ICD-10-CM

## 2019-09-17 DIAGNOSIS — I5022 Chronic systolic (congestive) heart failure: Principal | ICD-10-CM

## 2019-09-23 ENCOUNTER — Ambulatory Visit: Admit: 2019-09-23 | Discharge: 2019-09-23 | Payer: PRIVATE HEALTH INSURANCE

## 2019-09-23 ENCOUNTER — Ambulatory Visit
Admit: 2019-09-23 | Discharge: 2019-09-23 | Payer: PRIVATE HEALTH INSURANCE | Attending: Cardiovascular Disease | Primary: Cardiovascular Disease

## 2019-09-27 DIAGNOSIS — I5022 Chronic systolic (congestive) heart failure: Principal | ICD-10-CM

## 2019-09-30 ENCOUNTER — Ambulatory Visit: Admit: 2019-09-30 | Discharge: 2019-10-01 | Payer: PRIVATE HEALTH INSURANCE

## 2019-09-30 DIAGNOSIS — Z95811 Presence of heart assist device: Principal | ICD-10-CM

## 2019-10-01 MED ORDER — DOXYCYCLINE HYCLATE 100 MG TABLET
ORAL_TABLET | Freq: Two times a day (BID) | ORAL | 3 refills | 30 days | Status: CP
Start: 2019-10-01 — End: ?

## 2019-10-08 ENCOUNTER — Ambulatory Visit: Admit: 2019-10-08 | Discharge: 2019-10-08 | Payer: PRIVATE HEALTH INSURANCE

## 2019-10-08 ENCOUNTER — Ambulatory Visit
Admit: 2019-10-08 | Discharge: 2019-10-08 | Payer: PRIVATE HEALTH INSURANCE | Attending: Adult Health | Primary: Adult Health

## 2019-10-08 ENCOUNTER — Ambulatory Visit
Admit: 2019-10-08 | Discharge: 2019-10-08 | Payer: PRIVATE HEALTH INSURANCE | Attending: Infectious Disease | Primary: Infectious Disease

## 2019-10-08 DIAGNOSIS — Z95811 Presence of heart assist device: Principal | ICD-10-CM

## 2019-10-08 DIAGNOSIS — B999 Unspecified infectious disease: Principal | ICD-10-CM

## 2019-10-08 DIAGNOSIS — I5022 Chronic systolic (congestive) heart failure: Principal | ICD-10-CM

## 2019-10-08 DIAGNOSIS — N183 Chronic kidney disease, stage 3 (moderate): Principal | ICD-10-CM

## 2019-10-08 DIAGNOSIS — Z7901 Long term (current) use of anticoagulants: Principal | ICD-10-CM

## 2019-10-08 DIAGNOSIS — T827XXA Infection and inflammatory reaction due to other cardiac and vascular devices, implants and grafts, initial encounter: Principal | ICD-10-CM

## 2019-10-08 MED ORDER — WARFARIN 5 MG TABLET
ORAL_TABLET | Freq: Every day | ORAL | 11 refills | 30 days | Status: CP
Start: 2019-10-08 — End: 2020-10-07

## 2019-10-08 MED ORDER — WARFARIN 1 MG TABLET
ORAL_TABLET | 11 refills | 0 days | Status: CP
Start: 2019-10-08 — End: ?

## 2019-10-08 MED ORDER — MAGNESIUM OXIDE 400 MG (241.3 MG MAGNESIUM) TABLET
ORAL_TABLET | Freq: Every day | ORAL | 3 refills | 90.00000 days | Status: CP
Start: 2019-10-08 — End: 2020-10-07

## 2019-10-09 MED ORDER — PANTOPRAZOLE 20 MG TABLET,DELAYED RELEASE
ORAL_TABLET | 2 refills | 0 days | Status: CP
Start: 2019-10-09 — End: ?

## 2019-10-10 DIAGNOSIS — I5022 Chronic systolic (congestive) heart failure: Principal | ICD-10-CM

## 2019-10-14 ENCOUNTER — Ambulatory Visit: Admit: 2019-10-14 | Discharge: 2019-10-15 | Payer: PRIVATE HEALTH INSURANCE

## 2019-10-14 DIAGNOSIS — Z01818 Encounter for other preprocedural examination: Principal | ICD-10-CM

## 2019-10-17 DIAGNOSIS — I5022 Chronic systolic (congestive) heart failure: Principal | ICD-10-CM

## 2019-10-29 ENCOUNTER — Ambulatory Visit
Admit: 2019-10-29 | Discharge: 2019-10-30 | Payer: PRIVATE HEALTH INSURANCE | Attending: Infectious Disease | Primary: Infectious Disease

## 2019-10-29 ENCOUNTER — Ambulatory Visit: Admit: 2019-10-29 | Discharge: 2019-10-30 | Payer: PRIVATE HEALTH INSURANCE

## 2019-10-29 ENCOUNTER — Ambulatory Visit
Admit: 2019-10-29 | Discharge: 2019-10-30 | Payer: PRIVATE HEALTH INSURANCE | Attending: Adult Health | Primary: Adult Health

## 2019-10-29 DIAGNOSIS — Z95811 Presence of heart assist device: Principal | ICD-10-CM

## 2019-10-29 DIAGNOSIS — I5022 Chronic systolic (congestive) heart failure: Principal | ICD-10-CM

## 2019-10-29 MED ORDER — WARFARIN 5 MG TABLET
ORAL_TABLET | 11 refills | 0 days | Status: CP
Start: 2019-10-29 — End: ?

## 2019-10-29 MED ORDER — WARFARIN 1 MG TABLET
ORAL_TABLET | 11 refills | 0 days
Start: 2019-10-29 — End: ?

## 2019-10-29 MED ORDER — TRIAMCINOLONE ACETONIDE 0.5 % TOPICAL CREAM
Freq: Three times a day (TID) | TOPICAL | 0 refills | 0 days | Status: CP
Start: 2019-10-29 — End: 2020-10-28

## 2019-11-07 DIAGNOSIS — I5022 Chronic systolic (congestive) heart failure: Principal | ICD-10-CM

## 2019-11-08 ENCOUNTER — Ambulatory Visit: Admit: 2019-11-08 | Discharge: 2019-11-09 | Payer: PRIVATE HEALTH INSURANCE

## 2019-11-08 DIAGNOSIS — Z95811 Presence of heart assist device: Principal | ICD-10-CM

## 2019-11-12 ENCOUNTER — Ambulatory Visit: Admit: 2019-11-12 | Discharge: 2019-11-12 | Payer: PRIVATE HEALTH INSURANCE

## 2019-11-12 DIAGNOSIS — B999 Unspecified infectious disease: Principal | ICD-10-CM

## 2019-11-12 MED ORDER — DOXYCYCLINE HYCLATE 100 MG TABLET
ORAL_TABLET | Freq: Two times a day (BID) | ORAL | 11 refills | 30.00000 days | Status: CP
Start: 2019-11-12 — End: ?

## 2019-11-20 DIAGNOSIS — I5022 Chronic systolic (congestive) heart failure: Principal | ICD-10-CM

## 2019-11-21 ENCOUNTER — Ambulatory Visit: Admit: 2019-11-21 | Discharge: 2019-11-22 | Payer: PRIVATE HEALTH INSURANCE

## 2019-11-21 DIAGNOSIS — Z95811 Presence of heart assist device: Principal | ICD-10-CM

## 2019-12-11 ENCOUNTER — Institutional Professional Consult (permissible substitution): Admit: 2019-12-11 | Discharge: 2019-12-11 | Payer: PRIVATE HEALTH INSURANCE

## 2019-12-11 ENCOUNTER — Institutional Professional Consult (permissible substitution)
Admit: 2019-12-11 | Discharge: 2019-12-11 | Payer: PRIVATE HEALTH INSURANCE | Attending: Registered" | Primary: Registered"

## 2019-12-11 ENCOUNTER — Ambulatory Visit: Admit: 2019-12-11 | Discharge: 2019-12-11 | Payer: PRIVATE HEALTH INSURANCE | Attending: Clinical | Primary: Clinical

## 2019-12-12 ENCOUNTER — Ambulatory Visit: Admit: 2019-12-12 | Discharge: 2019-12-13 | Payer: PRIVATE HEALTH INSURANCE

## 2019-12-12 DIAGNOSIS — Z95811 Presence of heart assist device: Principal | ICD-10-CM

## 2019-12-13 ENCOUNTER — Institutional Professional Consult (permissible substitution): Admit: 2019-12-13 | Discharge: 2019-12-14 | Payer: PRIVATE HEALTH INSURANCE

## 2019-12-13 DIAGNOSIS — I513 Intracardiac thrombosis, not elsewhere classified: Principal | ICD-10-CM

## 2019-12-13 DIAGNOSIS — I10 Essential (primary) hypertension: Principal | ICD-10-CM

## 2019-12-13 DIAGNOSIS — I5022 Chronic systolic (congestive) heart failure: Principal | ICD-10-CM

## 2019-12-13 DIAGNOSIS — I255 Ischemic cardiomyopathy: Principal | ICD-10-CM

## 2019-12-13 DIAGNOSIS — I2510 Atherosclerotic heart disease of native coronary artery without angina pectoris: Principal | ICD-10-CM

## 2019-12-18 DIAGNOSIS — R972 Elevated prostate specific antigen [PSA]: Principal | ICD-10-CM

## 2019-12-23 ENCOUNTER — Institutional Professional Consult (permissible substitution): Admit: 2019-12-23 | Discharge: 2019-12-23 | Payer: PRIVATE HEALTH INSURANCE

## 2019-12-23 ENCOUNTER — Ambulatory Visit: Admit: 2019-12-23 | Discharge: 2019-12-23 | Payer: PRIVATE HEALTH INSURANCE

## 2019-12-23 ENCOUNTER — Ambulatory Visit
Admit: 2019-12-23 | Discharge: 2019-12-23 | Payer: PRIVATE HEALTH INSURANCE | Attending: Cardiovascular Disease | Primary: Cardiovascular Disease

## 2019-12-23 DIAGNOSIS — I5022 Chronic systolic (congestive) heart failure: Principal | ICD-10-CM

## 2019-12-23 DIAGNOSIS — R972 Elevated prostate specific antigen [PSA]: Principal | ICD-10-CM

## 2019-12-23 DIAGNOSIS — N183 Chronic kidney disease, stage 3 (moderate): Principal | ICD-10-CM

## 2019-12-23 DIAGNOSIS — I10 Essential (primary) hypertension: Principal | ICD-10-CM

## 2019-12-23 DIAGNOSIS — B999 Unspecified infectious disease: Principal | ICD-10-CM

## 2019-12-23 DIAGNOSIS — D508 Other iron deficiency anemias: Principal | ICD-10-CM

## 2019-12-23 DIAGNOSIS — Z95811 Presence of heart assist device: Principal | ICD-10-CM

## 2019-12-23 DIAGNOSIS — I255 Ischemic cardiomyopathy: Principal | ICD-10-CM

## 2019-12-23 DIAGNOSIS — I2510 Atherosclerotic heart disease of native coronary artery without angina pectoris: Principal | ICD-10-CM

## 2019-12-23 MED ORDER — DOXYCYCLINE HYCLATE 100 MG TABLET
ORAL_TABLET | Freq: Two times a day (BID) | ORAL | 11 refills | 45 days | Status: CP
Start: 2019-12-23 — End: ?

## 2019-12-23 MED ORDER — ENTRESTO 24 MG-26 MG TABLET
ORAL_TABLET | Freq: Two times a day (BID) | ORAL | 11 refills | 45 days | Status: CP
Start: 2019-12-23 — End: ?

## 2020-01-06 ENCOUNTER — Ambulatory Visit: Admit: 2020-01-06 | Discharge: 2020-01-07 | Payer: PRIVATE HEALTH INSURANCE

## 2020-01-06 DIAGNOSIS — Z95811 Presence of heart assist device: Principal | ICD-10-CM

## 2020-01-06 DIAGNOSIS — I5022 Chronic systolic (congestive) heart failure: Principal | ICD-10-CM

## 2020-01-27 DIAGNOSIS — Z01818 Encounter for other preprocedural examination: Principal | ICD-10-CM

## 2020-01-30 DIAGNOSIS — I5022 Chronic systolic (congestive) heart failure: Principal | ICD-10-CM

## 2020-02-03 ENCOUNTER — Ambulatory Visit: Admit: 2020-02-03 | Discharge: 2020-02-04 | Payer: PRIVATE HEALTH INSURANCE

## 2020-02-03 DIAGNOSIS — Z95811 Presence of heart assist device: Principal | ICD-10-CM

## 2020-02-05 ENCOUNTER — Ambulatory Visit: Admit: 2020-02-05 | Discharge: 2020-02-06 | Payer: PRIVATE HEALTH INSURANCE

## 2020-02-11 ENCOUNTER — Ambulatory Visit: Admit: 2020-02-11 | Discharge: 2020-02-12 | Payer: PRIVATE HEALTH INSURANCE

## 2020-02-11 DIAGNOSIS — Z95811 Presence of heart assist device: Principal | ICD-10-CM

## 2020-02-11 DIAGNOSIS — E782 Mixed hyperlipidemia: Principal | ICD-10-CM

## 2020-02-11 DIAGNOSIS — I255 Ischemic cardiomyopathy: Principal | ICD-10-CM

## 2020-02-14 DIAGNOSIS — I5022 Chronic systolic (congestive) heart failure: Principal | ICD-10-CM

## 2020-02-17 ENCOUNTER — Ambulatory Visit: Admit: 2020-02-17 | Discharge: 2020-02-18 | Payer: PRIVATE HEALTH INSURANCE

## 2020-02-17 DIAGNOSIS — Z95811 Presence of heart assist device: Principal | ICD-10-CM

## 2020-02-20 DIAGNOSIS — I5022 Chronic systolic (congestive) heart failure: Principal | ICD-10-CM

## 2020-02-24 ENCOUNTER — Ambulatory Visit: Admit: 2020-02-24 | Discharge: 2020-02-25 | Payer: PRIVATE HEALTH INSURANCE

## 2020-02-24 DIAGNOSIS — Z95811 Presence of heart assist device: Principal | ICD-10-CM

## 2020-02-26 DIAGNOSIS — I5022 Chronic systolic (congestive) heart failure: Principal | ICD-10-CM

## 2020-02-27 ENCOUNTER — Ambulatory Visit
Admit: 2020-02-27 | Discharge: 2020-08-05 | Disposition: A | Discharge status: Rehabilitation Facility | Payer: PRIVATE HEALTH INSURANCE

## 2020-02-27 ENCOUNTER — Encounter
Admit: 2020-02-27 | Discharge: 2020-08-05 | Disposition: A | Discharge status: Rehabilitation Facility | Payer: PRIVATE HEALTH INSURANCE | Attending: Registered Nurse

## 2020-02-27 ENCOUNTER — Encounter
Admit: 2020-02-27 | Discharge: 2020-08-05 | Disposition: A | Discharge status: Rehabilitation Facility | Payer: PRIVATE HEALTH INSURANCE | Attending: Anesthesiology

## 2020-02-27 ENCOUNTER — Encounter
Admit: 2020-02-27 | Discharge: 2020-08-05 | Disposition: A | Discharge status: Rehabilitation Facility | Payer: PRIVATE HEALTH INSURANCE | Attending: Student in an Organized Health Care Education/Training Program

## 2020-02-27 ENCOUNTER — Encounter: Admit: 2020-02-27 | Discharge: 2020-02-27 | Payer: PRIVATE HEALTH INSURANCE

## 2020-02-27 ENCOUNTER — Encounter
Admit: 2020-02-27 | Discharge: 2020-08-05 | Disposition: A | Discharge status: Rehabilitation Facility | Payer: PRIVATE HEALTH INSURANCE

## 2020-02-28 DIAGNOSIS — Z941 Heart transplant status: Secondary | ICD-10-CM

## 2020-02-28 DIAGNOSIS — Z7682 Awaiting organ transplant status: Principal | ICD-10-CM

## 2020-03-02 DIAGNOSIS — Z941 Heart transplant status: Principal | ICD-10-CM

## 2020-03-02 MED ORDER — MYCOPHENOLATE MOFETIL 500 MG TABLET
ORAL_TABLET | Freq: Two times a day (BID) | ORAL | 0 refills | 30.00000 days | Status: CP
Start: 2020-03-02 — End: 2020-04-01

## 2020-03-02 MED ORDER — TACROLIMUS 1 MG CAPSULE, IMMEDIATE-RELEASE
ORAL_CAPSULE | Freq: Two times a day (BID) | ORAL | 0 refills | 30 days | Status: CP
Start: 2020-03-02 — End: 2020-04-01

## 2020-03-02 MED ORDER — VALGANCICLOVIR 450 MG TABLET
ORAL_TABLET | Freq: Every day | ORAL | 0 refills | 30 days | Status: CP
Start: 2020-03-02 — End: 2020-04-01

## 2020-06-29 DIAGNOSIS — N170 Acute kidney failure with tubular necrosis: Principal | ICD-10-CM

## 2020-06-29 DIAGNOSIS — F05 Delirium due to known physiological condition: Principal | ICD-10-CM

## 2020-06-29 DIAGNOSIS — I2510 Atherosclerotic heart disease of native coronary artery without angina pectoris: Principal | ICD-10-CM

## 2020-06-29 DIAGNOSIS — Z85828 Personal history of other malignant neoplasm of skin: Principal | ICD-10-CM

## 2020-06-29 DIAGNOSIS — E1122 Type 2 diabetes mellitus with diabetic chronic kidney disease: Principal | ICD-10-CM

## 2020-06-29 DIAGNOSIS — J95821 Acute postprocedural respiratory failure: Principal | ICD-10-CM

## 2020-06-29 DIAGNOSIS — J9809 Other diseases of bronchus, not elsewhere classified: Principal | ICD-10-CM

## 2020-06-29 DIAGNOSIS — A4153 Sepsis due to Serratia: Principal | ICD-10-CM

## 2020-06-29 DIAGNOSIS — I480 Paroxysmal atrial fibrillation: Principal | ICD-10-CM

## 2020-06-29 DIAGNOSIS — N186 End stage renal disease: Principal | ICD-10-CM

## 2020-06-29 DIAGNOSIS — K921 Melena: Principal | ICD-10-CM

## 2020-06-29 DIAGNOSIS — T451X5A Adverse effect of antineoplastic and immunosuppressive drugs, initial encounter: Principal | ICD-10-CM

## 2020-06-29 DIAGNOSIS — I5084 End stage heart failure: Principal | ICD-10-CM

## 2020-06-29 DIAGNOSIS — K4090 Unilateral inguinal hernia, without obstruction or gangrene, not specified as recurrent: Principal | ICD-10-CM

## 2020-06-29 DIAGNOSIS — J95851 Ventilator associated pneumonia: Principal | ICD-10-CM

## 2020-06-29 DIAGNOSIS — R6521 Severe sepsis with septic shock: Principal | ICD-10-CM

## 2020-06-29 DIAGNOSIS — B371 Pulmonary candidiasis: Principal | ICD-10-CM

## 2020-06-29 DIAGNOSIS — R188 Other ascites: Principal | ICD-10-CM

## 2020-06-29 DIAGNOSIS — M7989 Other specified soft tissue disorders: Principal | ICD-10-CM

## 2020-06-29 DIAGNOSIS — E878 Other disorders of electrolyte and fluid balance, not elsewhere classified: Principal | ICD-10-CM

## 2020-06-29 DIAGNOSIS — D62 Acute posthemorrhagic anemia: Principal | ICD-10-CM

## 2020-06-29 DIAGNOSIS — D631 Anemia in chronic kidney disease: Principal | ICD-10-CM

## 2020-06-29 DIAGNOSIS — J851 Abscess of lung with pneumonia: Principal | ICD-10-CM

## 2020-06-29 DIAGNOSIS — I97418 Intraoperative hemorrhage and hematoma of a circulatory system organ or structure complicating other circulatory system procedure: Principal | ICD-10-CM

## 2020-06-29 DIAGNOSIS — E8770 Fluid overload, unspecified: Principal | ICD-10-CM

## 2020-06-29 DIAGNOSIS — J986 Disorders of diaphragm: Principal | ICD-10-CM

## 2020-06-29 DIAGNOSIS — Z7982 Long term (current) use of aspirin: Principal | ICD-10-CM

## 2020-06-29 DIAGNOSIS — G92 Toxic encephalopathy: Principal | ICD-10-CM

## 2020-06-29 DIAGNOSIS — E872 Acidosis: Principal | ICD-10-CM

## 2020-06-29 DIAGNOSIS — D696 Thrombocytopenia, unspecified: Principal | ICD-10-CM

## 2020-06-29 DIAGNOSIS — I9589 Other hypotension: Principal | ICD-10-CM

## 2020-06-29 DIAGNOSIS — G253 Myoclonus: Principal | ICD-10-CM

## 2020-06-29 DIAGNOSIS — K632 Fistula of intestine: Principal | ICD-10-CM

## 2020-06-29 DIAGNOSIS — R1110 Vomiting, unspecified: Principal | ICD-10-CM

## 2020-06-29 DIAGNOSIS — T50905A Adverse effect of unspecified drugs, medicaments and biological substances, initial encounter: Principal | ICD-10-CM

## 2020-06-29 DIAGNOSIS — K650 Generalized (acute) peritonitis: Principal | ICD-10-CM

## 2020-06-29 DIAGNOSIS — D849 Immunodeficiency, unspecified: Principal | ICD-10-CM

## 2020-06-29 DIAGNOSIS — E1165 Type 2 diabetes mellitus with hyperglycemia: Principal | ICD-10-CM

## 2020-06-29 DIAGNOSIS — K5900 Constipation, unspecified: Principal | ICD-10-CM

## 2020-06-29 DIAGNOSIS — I5022 Chronic systolic (congestive) heart failure: Principal | ICD-10-CM

## 2020-06-29 DIAGNOSIS — F418 Other specified anxiety disorders: Principal | ICD-10-CM

## 2020-06-29 DIAGNOSIS — R17 Unspecified jaundice: Principal | ICD-10-CM

## 2020-06-29 DIAGNOSIS — M109 Gout, unspecified: Principal | ICD-10-CM

## 2020-06-29 DIAGNOSIS — E785 Hyperlipidemia, unspecified: Principal | ICD-10-CM

## 2020-06-29 DIAGNOSIS — K449 Diaphragmatic hernia without obstruction or gangrene: Principal | ICD-10-CM

## 2020-06-29 DIAGNOSIS — K9171 Accidental puncture and laceration of a digestive system organ or structure during a digestive system procedure: Principal | ICD-10-CM

## 2020-06-29 DIAGNOSIS — J156 Pneumonia due to other aerobic Gram-negative bacteria: Principal | ICD-10-CM

## 2020-06-29 DIAGNOSIS — J151 Pneumonia due to Pseudomonas: Principal | ICD-10-CM

## 2020-06-29 DIAGNOSIS — R680 Hypothermia, not associated with low environmental temperature: Principal | ICD-10-CM

## 2020-06-29 DIAGNOSIS — S27309A Unspecified injury of lung, unspecified, initial encounter: Principal | ICD-10-CM

## 2020-06-29 DIAGNOSIS — J948 Other specified pleural conditions: Principal | ICD-10-CM

## 2020-06-29 DIAGNOSIS — Z7901 Long term (current) use of anticoagulants: Principal | ICD-10-CM

## 2020-06-29 DIAGNOSIS — K2100 Gastro-esophageal reflux disease with esophagitis, without bleeding: Principal | ICD-10-CM

## 2020-06-29 DIAGNOSIS — K9423 Gastrostomy malfunction: Principal | ICD-10-CM

## 2020-06-29 DIAGNOSIS — J90 Pleural effusion, not elsewhere classified: Principal | ICD-10-CM

## 2020-06-29 DIAGNOSIS — I252 Old myocardial infarction: Principal | ICD-10-CM

## 2020-06-29 DIAGNOSIS — Z955 Presence of coronary angioplasty implant and graft: Principal | ICD-10-CM

## 2020-06-29 DIAGNOSIS — Z95811 Presence of heart assist device: Principal | ICD-10-CM

## 2020-06-29 DIAGNOSIS — T8131XA Disruption of external operation (surgical) wound, not elsewhere classified, initial encounter: Principal | ICD-10-CM

## 2020-06-29 DIAGNOSIS — J9819 Other pulmonary collapse: Principal | ICD-10-CM

## 2020-06-29 DIAGNOSIS — D689 Coagulation defect, unspecified: Principal | ICD-10-CM

## 2020-06-29 DIAGNOSIS — I129 Hypertensive chronic kidney disease with stage 1 through stage 4 chronic kidney disease, or unspecified chronic kidney disease: Principal | ICD-10-CM

## 2020-06-29 DIAGNOSIS — B259 Cytomegaloviral disease, unspecified: Principal | ICD-10-CM

## 2020-06-29 DIAGNOSIS — J982 Interstitial emphysema: Principal | ICD-10-CM

## 2020-06-29 DIAGNOSIS — I255 Ischemic cardiomyopathy: Principal | ICD-10-CM

## 2020-06-29 DIAGNOSIS — R570 Cardiogenic shock: Principal | ICD-10-CM

## 2020-06-29 DIAGNOSIS — E875 Hyperkalemia: Principal | ICD-10-CM

## 2020-06-29 DIAGNOSIS — Z992 Dependence on renal dialysis: Principal | ICD-10-CM

## 2020-07-01 DIAGNOSIS — I480 Paroxysmal atrial fibrillation: Principal | ICD-10-CM

## 2020-07-01 DIAGNOSIS — B259 Cytomegaloviral disease, unspecified: Principal | ICD-10-CM

## 2020-07-01 DIAGNOSIS — K4090 Unilateral inguinal hernia, without obstruction or gangrene, not specified as recurrent: Principal | ICD-10-CM

## 2020-07-01 DIAGNOSIS — K2100 Gastro-esophageal reflux disease with esophagitis, without bleeding: Principal | ICD-10-CM

## 2020-07-01 DIAGNOSIS — K5900 Constipation, unspecified: Principal | ICD-10-CM

## 2020-07-01 DIAGNOSIS — R1110 Vomiting, unspecified: Principal | ICD-10-CM

## 2020-07-01 DIAGNOSIS — I252 Old myocardial infarction: Principal | ICD-10-CM

## 2020-07-01 DIAGNOSIS — J948 Other specified pleural conditions: Principal | ICD-10-CM

## 2020-07-01 DIAGNOSIS — E875 Hyperkalemia: Principal | ICD-10-CM

## 2020-07-01 DIAGNOSIS — J156 Pneumonia due to other aerobic Gram-negative bacteria: Principal | ICD-10-CM

## 2020-07-01 DIAGNOSIS — E1122 Type 2 diabetes mellitus with diabetic chronic kidney disease: Principal | ICD-10-CM

## 2020-07-01 DIAGNOSIS — S27309A Unspecified injury of lung, unspecified, initial encounter: Principal | ICD-10-CM

## 2020-07-01 DIAGNOSIS — I129 Hypertensive chronic kidney disease with stage 1 through stage 4 chronic kidney disease, or unspecified chronic kidney disease: Principal | ICD-10-CM

## 2020-07-01 DIAGNOSIS — R570 Cardiogenic shock: Principal | ICD-10-CM

## 2020-07-01 DIAGNOSIS — I2510 Atherosclerotic heart disease of native coronary artery without angina pectoris: Principal | ICD-10-CM

## 2020-07-01 DIAGNOSIS — J95821 Acute postprocedural respiratory failure: Principal | ICD-10-CM

## 2020-07-01 DIAGNOSIS — J986 Disorders of diaphragm: Principal | ICD-10-CM

## 2020-07-01 DIAGNOSIS — G253 Myoclonus: Principal | ICD-10-CM

## 2020-07-01 DIAGNOSIS — J851 Abscess of lung with pneumonia: Principal | ICD-10-CM

## 2020-07-01 DIAGNOSIS — R680 Hypothermia, not associated with low environmental temperature: Principal | ICD-10-CM

## 2020-07-01 DIAGNOSIS — K449 Diaphragmatic hernia without obstruction or gangrene: Principal | ICD-10-CM

## 2020-07-01 DIAGNOSIS — Z85828 Personal history of other malignant neoplasm of skin: Principal | ICD-10-CM

## 2020-07-01 DIAGNOSIS — I97418 Intraoperative hemorrhage and hematoma of a circulatory system organ or structure complicating other circulatory system procedure: Principal | ICD-10-CM

## 2020-07-01 DIAGNOSIS — K650 Generalized (acute) peritonitis: Principal | ICD-10-CM

## 2020-07-01 DIAGNOSIS — D689 Coagulation defect, unspecified: Principal | ICD-10-CM

## 2020-07-01 DIAGNOSIS — J982 Interstitial emphysema: Principal | ICD-10-CM

## 2020-07-01 DIAGNOSIS — D696 Thrombocytopenia, unspecified: Principal | ICD-10-CM

## 2020-07-01 DIAGNOSIS — J95851 Ventilator associated pneumonia: Principal | ICD-10-CM

## 2020-07-01 DIAGNOSIS — Z992 Dependence on renal dialysis: Principal | ICD-10-CM

## 2020-07-01 DIAGNOSIS — I5022 Chronic systolic (congestive) heart failure: Principal | ICD-10-CM

## 2020-07-01 DIAGNOSIS — E8770 Fluid overload, unspecified: Principal | ICD-10-CM

## 2020-07-01 DIAGNOSIS — E878 Other disorders of electrolyte and fluid balance, not elsewhere classified: Principal | ICD-10-CM

## 2020-07-01 DIAGNOSIS — T50905A Adverse effect of unspecified drugs, medicaments and biological substances, initial encounter: Principal | ICD-10-CM

## 2020-07-01 DIAGNOSIS — M7989 Other specified soft tissue disorders: Principal | ICD-10-CM

## 2020-07-01 DIAGNOSIS — D849 Immunodeficiency, unspecified: Principal | ICD-10-CM

## 2020-07-01 DIAGNOSIS — K632 Fistula of intestine: Principal | ICD-10-CM

## 2020-07-01 DIAGNOSIS — B371 Pulmonary candidiasis: Principal | ICD-10-CM

## 2020-07-01 DIAGNOSIS — E872 Acidosis: Principal | ICD-10-CM

## 2020-07-01 DIAGNOSIS — D631 Anemia in chronic kidney disease: Principal | ICD-10-CM

## 2020-07-01 DIAGNOSIS — M109 Gout, unspecified: Principal | ICD-10-CM

## 2020-07-01 DIAGNOSIS — E1165 Type 2 diabetes mellitus with hyperglycemia: Principal | ICD-10-CM

## 2020-07-01 DIAGNOSIS — K921 Melena: Principal | ICD-10-CM

## 2020-07-01 DIAGNOSIS — Z7982 Long term (current) use of aspirin: Principal | ICD-10-CM

## 2020-07-01 DIAGNOSIS — R6521 Severe sepsis with septic shock: Principal | ICD-10-CM

## 2020-07-01 DIAGNOSIS — K9423 Gastrostomy malfunction: Principal | ICD-10-CM

## 2020-07-01 DIAGNOSIS — Z7901 Long term (current) use of anticoagulants: Principal | ICD-10-CM

## 2020-07-01 DIAGNOSIS — Z955 Presence of coronary angioplasty implant and graft: Principal | ICD-10-CM

## 2020-07-01 DIAGNOSIS — J90 Pleural effusion, not elsewhere classified: Principal | ICD-10-CM

## 2020-07-01 DIAGNOSIS — I255 Ischemic cardiomyopathy: Principal | ICD-10-CM

## 2020-07-01 DIAGNOSIS — G92 Toxic encephalopathy: Principal | ICD-10-CM

## 2020-07-01 DIAGNOSIS — F418 Other specified anxiety disorders: Principal | ICD-10-CM

## 2020-07-01 DIAGNOSIS — E785 Hyperlipidemia, unspecified: Principal | ICD-10-CM

## 2020-07-01 DIAGNOSIS — Z95811 Presence of heart assist device: Principal | ICD-10-CM

## 2020-07-01 DIAGNOSIS — K9171 Accidental puncture and laceration of a digestive system organ or structure during a digestive system procedure: Principal | ICD-10-CM

## 2020-07-01 DIAGNOSIS — T451X5A Adverse effect of antineoplastic and immunosuppressive drugs, initial encounter: Principal | ICD-10-CM

## 2020-07-01 DIAGNOSIS — I9589 Other hypotension: Principal | ICD-10-CM

## 2020-07-01 DIAGNOSIS — I5084 End stage heart failure: Principal | ICD-10-CM

## 2020-07-01 DIAGNOSIS — J9819 Other pulmonary collapse: Principal | ICD-10-CM

## 2020-07-01 DIAGNOSIS — T8131XA Disruption of external operation (surgical) wound, not elsewhere classified, initial encounter: Principal | ICD-10-CM

## 2020-07-01 DIAGNOSIS — N186 End stage renal disease: Principal | ICD-10-CM

## 2020-07-01 DIAGNOSIS — N170 Acute kidney failure with tubular necrosis: Principal | ICD-10-CM

## 2020-07-01 DIAGNOSIS — J151 Pneumonia due to Pseudomonas: Principal | ICD-10-CM

## 2020-07-01 DIAGNOSIS — A4153 Sepsis due to Serratia: Principal | ICD-10-CM

## 2020-07-01 DIAGNOSIS — R17 Unspecified jaundice: Principal | ICD-10-CM

## 2020-07-01 DIAGNOSIS — R188 Other ascites: Principal | ICD-10-CM

## 2020-07-01 DIAGNOSIS — D62 Acute posthemorrhagic anemia: Principal | ICD-10-CM

## 2020-07-01 DIAGNOSIS — F05 Delirium due to known physiological condition: Principal | ICD-10-CM

## 2020-07-01 DIAGNOSIS — J9809 Other diseases of bronchus, not elsewhere classified: Principal | ICD-10-CM

## 2020-07-03 DIAGNOSIS — I9589 Other hypotension: Principal | ICD-10-CM

## 2020-07-03 DIAGNOSIS — J982 Interstitial emphysema: Principal | ICD-10-CM

## 2020-07-03 DIAGNOSIS — B259 Cytomegaloviral disease, unspecified: Principal | ICD-10-CM

## 2020-07-03 DIAGNOSIS — A4153 Sepsis due to Serratia: Principal | ICD-10-CM

## 2020-07-03 DIAGNOSIS — Z992 Dependence on renal dialysis: Principal | ICD-10-CM

## 2020-07-03 DIAGNOSIS — N170 Acute kidney failure with tubular necrosis: Principal | ICD-10-CM

## 2020-07-03 DIAGNOSIS — J156 Pneumonia due to other aerobic Gram-negative bacteria: Principal | ICD-10-CM

## 2020-07-03 DIAGNOSIS — B371 Pulmonary candidiasis: Principal | ICD-10-CM

## 2020-07-03 DIAGNOSIS — G92 Toxic encephalopathy: Principal | ICD-10-CM

## 2020-07-03 DIAGNOSIS — I2510 Atherosclerotic heart disease of native coronary artery without angina pectoris: Principal | ICD-10-CM

## 2020-07-03 DIAGNOSIS — R6521 Severe sepsis with septic shock: Principal | ICD-10-CM

## 2020-07-03 DIAGNOSIS — G253 Myoclonus: Principal | ICD-10-CM

## 2020-07-03 DIAGNOSIS — J95851 Ventilator associated pneumonia: Principal | ICD-10-CM

## 2020-07-03 DIAGNOSIS — M7989 Other specified soft tissue disorders: Principal | ICD-10-CM

## 2020-07-03 DIAGNOSIS — E785 Hyperlipidemia, unspecified: Principal | ICD-10-CM

## 2020-07-03 DIAGNOSIS — K921 Melena: Principal | ICD-10-CM

## 2020-07-03 DIAGNOSIS — K9171 Accidental puncture and laceration of a digestive system organ or structure during a digestive system procedure: Principal | ICD-10-CM

## 2020-07-03 DIAGNOSIS — I129 Hypertensive chronic kidney disease with stage 1 through stage 4 chronic kidney disease, or unspecified chronic kidney disease: Principal | ICD-10-CM

## 2020-07-03 DIAGNOSIS — K4090 Unilateral inguinal hernia, without obstruction or gangrene, not specified as recurrent: Principal | ICD-10-CM

## 2020-07-03 DIAGNOSIS — R188 Other ascites: Principal | ICD-10-CM

## 2020-07-03 DIAGNOSIS — J151 Pneumonia due to Pseudomonas: Principal | ICD-10-CM

## 2020-07-03 DIAGNOSIS — R570 Cardiogenic shock: Principal | ICD-10-CM

## 2020-07-03 DIAGNOSIS — J90 Pleural effusion, not elsewhere classified: Principal | ICD-10-CM

## 2020-07-03 DIAGNOSIS — E1165 Type 2 diabetes mellitus with hyperglycemia: Principal | ICD-10-CM

## 2020-07-03 DIAGNOSIS — Z85828 Personal history of other malignant neoplasm of skin: Principal | ICD-10-CM

## 2020-07-03 DIAGNOSIS — E8770 Fluid overload, unspecified: Principal | ICD-10-CM

## 2020-07-03 DIAGNOSIS — K9423 Gastrostomy malfunction: Principal | ICD-10-CM

## 2020-07-03 DIAGNOSIS — T451X5A Adverse effect of antineoplastic and immunosuppressive drugs, initial encounter: Principal | ICD-10-CM

## 2020-07-03 DIAGNOSIS — R1110 Vomiting, unspecified: Principal | ICD-10-CM

## 2020-07-03 DIAGNOSIS — F05 Delirium due to known physiological condition: Principal | ICD-10-CM

## 2020-07-03 DIAGNOSIS — J9819 Other pulmonary collapse: Principal | ICD-10-CM

## 2020-07-03 DIAGNOSIS — R680 Hypothermia, not associated with low environmental temperature: Principal | ICD-10-CM

## 2020-07-03 DIAGNOSIS — I5084 End stage heart failure: Principal | ICD-10-CM

## 2020-07-03 DIAGNOSIS — D62 Acute posthemorrhagic anemia: Principal | ICD-10-CM

## 2020-07-03 DIAGNOSIS — I5022 Chronic systolic (congestive) heart failure: Principal | ICD-10-CM

## 2020-07-03 DIAGNOSIS — K449 Diaphragmatic hernia without obstruction or gangrene: Principal | ICD-10-CM

## 2020-07-03 DIAGNOSIS — Z7982 Long term (current) use of aspirin: Principal | ICD-10-CM

## 2020-07-03 DIAGNOSIS — Z955 Presence of coronary angioplasty implant and graft: Principal | ICD-10-CM

## 2020-07-03 DIAGNOSIS — E875 Hyperkalemia: Principal | ICD-10-CM

## 2020-07-03 DIAGNOSIS — J9809 Other diseases of bronchus, not elsewhere classified: Principal | ICD-10-CM

## 2020-07-03 DIAGNOSIS — I255 Ischemic cardiomyopathy: Principal | ICD-10-CM

## 2020-07-03 DIAGNOSIS — F418 Other specified anxiety disorders: Principal | ICD-10-CM

## 2020-07-03 DIAGNOSIS — D849 Immunodeficiency, unspecified: Principal | ICD-10-CM

## 2020-07-03 DIAGNOSIS — J986 Disorders of diaphragm: Principal | ICD-10-CM

## 2020-07-03 DIAGNOSIS — K5900 Constipation, unspecified: Principal | ICD-10-CM

## 2020-07-03 DIAGNOSIS — D696 Thrombocytopenia, unspecified: Principal | ICD-10-CM

## 2020-07-03 DIAGNOSIS — S27309A Unspecified injury of lung, unspecified, initial encounter: Principal | ICD-10-CM

## 2020-07-03 DIAGNOSIS — T8131XA Disruption of external operation (surgical) wound, not elsewhere classified, initial encounter: Principal | ICD-10-CM

## 2020-07-03 DIAGNOSIS — N186 End stage renal disease: Principal | ICD-10-CM

## 2020-07-03 DIAGNOSIS — E878 Other disorders of electrolyte and fluid balance, not elsewhere classified: Principal | ICD-10-CM

## 2020-07-03 DIAGNOSIS — J95821 Acute postprocedural respiratory failure: Principal | ICD-10-CM

## 2020-07-03 DIAGNOSIS — D631 Anemia in chronic kidney disease: Principal | ICD-10-CM

## 2020-07-03 DIAGNOSIS — T50905A Adverse effect of unspecified drugs, medicaments and biological substances, initial encounter: Principal | ICD-10-CM

## 2020-07-03 DIAGNOSIS — M109 Gout, unspecified: Principal | ICD-10-CM

## 2020-07-03 DIAGNOSIS — K650 Generalized (acute) peritonitis: Principal | ICD-10-CM

## 2020-07-03 DIAGNOSIS — Z7901 Long term (current) use of anticoagulants: Principal | ICD-10-CM

## 2020-07-03 DIAGNOSIS — K2100 Gastro-esophageal reflux disease with esophagitis, without bleeding: Principal | ICD-10-CM

## 2020-07-03 DIAGNOSIS — E872 Acidosis: Principal | ICD-10-CM

## 2020-07-03 DIAGNOSIS — R17 Unspecified jaundice: Principal | ICD-10-CM

## 2020-07-03 DIAGNOSIS — D689 Coagulation defect, unspecified: Principal | ICD-10-CM

## 2020-07-03 DIAGNOSIS — J948 Other specified pleural conditions: Principal | ICD-10-CM

## 2020-07-03 DIAGNOSIS — I252 Old myocardial infarction: Principal | ICD-10-CM

## 2020-07-03 DIAGNOSIS — I480 Paroxysmal atrial fibrillation: Principal | ICD-10-CM

## 2020-07-03 DIAGNOSIS — I97418 Intraoperative hemorrhage and hematoma of a circulatory system organ or structure complicating other circulatory system procedure: Principal | ICD-10-CM

## 2020-07-03 DIAGNOSIS — J851 Abscess of lung with pneumonia: Principal | ICD-10-CM

## 2020-07-03 DIAGNOSIS — Z95811 Presence of heart assist device: Principal | ICD-10-CM

## 2020-07-03 DIAGNOSIS — E1122 Type 2 diabetes mellitus with diabetic chronic kidney disease: Principal | ICD-10-CM

## 2020-07-03 DIAGNOSIS — K632 Fistula of intestine: Principal | ICD-10-CM

## 2020-07-04 DIAGNOSIS — I2510 Atherosclerotic heart disease of native coronary artery without angina pectoris: Principal | ICD-10-CM

## 2020-07-04 DIAGNOSIS — R570 Cardiogenic shock: Principal | ICD-10-CM

## 2020-07-04 DIAGNOSIS — J851 Abscess of lung with pneumonia: Principal | ICD-10-CM

## 2020-07-04 DIAGNOSIS — Z7901 Long term (current) use of anticoagulants: Principal | ICD-10-CM

## 2020-07-04 DIAGNOSIS — I97418 Intraoperative hemorrhage and hematoma of a circulatory system organ or structure complicating other circulatory system procedure: Principal | ICD-10-CM

## 2020-07-04 DIAGNOSIS — D62 Acute posthemorrhagic anemia: Principal | ICD-10-CM

## 2020-07-04 DIAGNOSIS — N186 End stage renal disease: Principal | ICD-10-CM

## 2020-07-04 DIAGNOSIS — E878 Other disorders of electrolyte and fluid balance, not elsewhere classified: Principal | ICD-10-CM

## 2020-07-04 DIAGNOSIS — Z992 Dependence on renal dialysis: Principal | ICD-10-CM

## 2020-07-04 DIAGNOSIS — E785 Hyperlipidemia, unspecified: Principal | ICD-10-CM

## 2020-07-04 DIAGNOSIS — D631 Anemia in chronic kidney disease: Principal | ICD-10-CM

## 2020-07-04 DIAGNOSIS — K2100 Gastro-esophageal reflux disease with esophagitis, without bleeding: Principal | ICD-10-CM

## 2020-07-04 DIAGNOSIS — I9589 Other hypotension: Principal | ICD-10-CM

## 2020-07-04 DIAGNOSIS — D696 Thrombocytopenia, unspecified: Principal | ICD-10-CM

## 2020-07-04 DIAGNOSIS — I480 Paroxysmal atrial fibrillation: Principal | ICD-10-CM

## 2020-07-04 DIAGNOSIS — T50905A Adverse effect of unspecified drugs, medicaments and biological substances, initial encounter: Principal | ICD-10-CM

## 2020-07-04 DIAGNOSIS — I5084 End stage heart failure: Principal | ICD-10-CM

## 2020-07-04 DIAGNOSIS — I5022 Chronic systolic (congestive) heart failure: Principal | ICD-10-CM

## 2020-07-04 DIAGNOSIS — J95851 Ventilator associated pneumonia: Principal | ICD-10-CM

## 2020-07-04 DIAGNOSIS — J948 Other specified pleural conditions: Principal | ICD-10-CM

## 2020-07-04 DIAGNOSIS — E875 Hyperkalemia: Principal | ICD-10-CM

## 2020-07-04 DIAGNOSIS — J90 Pleural effusion, not elsewhere classified: Principal | ICD-10-CM

## 2020-07-04 DIAGNOSIS — E872 Acidosis: Principal | ICD-10-CM

## 2020-07-04 DIAGNOSIS — K921 Melena: Principal | ICD-10-CM

## 2020-07-04 DIAGNOSIS — J95821 Acute postprocedural respiratory failure: Principal | ICD-10-CM

## 2020-07-04 DIAGNOSIS — J982 Interstitial emphysema: Principal | ICD-10-CM

## 2020-07-04 DIAGNOSIS — J156 Pneumonia due to other aerobic Gram-negative bacteria: Principal | ICD-10-CM

## 2020-07-04 DIAGNOSIS — K632 Fistula of intestine: Principal | ICD-10-CM

## 2020-07-04 DIAGNOSIS — R6521 Severe sepsis with septic shock: Principal | ICD-10-CM

## 2020-07-04 DIAGNOSIS — Z85828 Personal history of other malignant neoplasm of skin: Principal | ICD-10-CM

## 2020-07-04 DIAGNOSIS — J986 Disorders of diaphragm: Principal | ICD-10-CM

## 2020-07-04 DIAGNOSIS — R188 Other ascites: Principal | ICD-10-CM

## 2020-07-04 DIAGNOSIS — I129 Hypertensive chronic kidney disease with stage 1 through stage 4 chronic kidney disease, or unspecified chronic kidney disease: Principal | ICD-10-CM

## 2020-07-04 DIAGNOSIS — R680 Hypothermia, not associated with low environmental temperature: Principal | ICD-10-CM

## 2020-07-04 DIAGNOSIS — J151 Pneumonia due to Pseudomonas: Principal | ICD-10-CM

## 2020-07-04 DIAGNOSIS — E1165 Type 2 diabetes mellitus with hyperglycemia: Principal | ICD-10-CM

## 2020-07-04 DIAGNOSIS — D689 Coagulation defect, unspecified: Principal | ICD-10-CM

## 2020-07-04 DIAGNOSIS — K9171 Accidental puncture and laceration of a digestive system organ or structure during a digestive system procedure: Principal | ICD-10-CM

## 2020-07-04 DIAGNOSIS — E1122 Type 2 diabetes mellitus with diabetic chronic kidney disease: Principal | ICD-10-CM

## 2020-07-04 DIAGNOSIS — K4090 Unilateral inguinal hernia, without obstruction or gangrene, not specified as recurrent: Principal | ICD-10-CM

## 2020-07-04 DIAGNOSIS — Z955 Presence of coronary angioplasty implant and graft: Principal | ICD-10-CM

## 2020-07-04 DIAGNOSIS — I255 Ischemic cardiomyopathy: Principal | ICD-10-CM

## 2020-07-04 DIAGNOSIS — Z7982 Long term (current) use of aspirin: Principal | ICD-10-CM

## 2020-07-04 DIAGNOSIS — J9819 Other pulmonary collapse: Principal | ICD-10-CM

## 2020-07-04 DIAGNOSIS — K449 Diaphragmatic hernia without obstruction or gangrene: Principal | ICD-10-CM

## 2020-07-04 DIAGNOSIS — T451X5A Adverse effect of antineoplastic and immunosuppressive drugs, initial encounter: Principal | ICD-10-CM

## 2020-07-04 DIAGNOSIS — F418 Other specified anxiety disorders: Principal | ICD-10-CM

## 2020-07-04 DIAGNOSIS — E8770 Fluid overload, unspecified: Principal | ICD-10-CM

## 2020-07-04 DIAGNOSIS — S27309A Unspecified injury of lung, unspecified, initial encounter: Principal | ICD-10-CM

## 2020-07-04 DIAGNOSIS — B371 Pulmonary candidiasis: Principal | ICD-10-CM

## 2020-07-04 DIAGNOSIS — A4153 Sepsis due to Serratia: Principal | ICD-10-CM

## 2020-07-04 DIAGNOSIS — M7989 Other specified soft tissue disorders: Principal | ICD-10-CM

## 2020-07-04 DIAGNOSIS — K9423 Gastrostomy malfunction: Principal | ICD-10-CM

## 2020-07-04 DIAGNOSIS — R1110 Vomiting, unspecified: Principal | ICD-10-CM

## 2020-07-04 DIAGNOSIS — Z95811 Presence of heart assist device: Principal | ICD-10-CM

## 2020-07-04 DIAGNOSIS — G92 Toxic encephalopathy: Principal | ICD-10-CM

## 2020-07-04 DIAGNOSIS — M109 Gout, unspecified: Principal | ICD-10-CM

## 2020-07-04 DIAGNOSIS — R17 Unspecified jaundice: Principal | ICD-10-CM

## 2020-07-04 DIAGNOSIS — N170 Acute kidney failure with tubular necrosis: Principal | ICD-10-CM

## 2020-07-04 DIAGNOSIS — I252 Old myocardial infarction: Principal | ICD-10-CM

## 2020-07-04 DIAGNOSIS — T8131XA Disruption of external operation (surgical) wound, not elsewhere classified, initial encounter: Principal | ICD-10-CM

## 2020-07-04 DIAGNOSIS — B259 Cytomegaloviral disease, unspecified: Principal | ICD-10-CM

## 2020-07-04 DIAGNOSIS — G253 Myoclonus: Principal | ICD-10-CM

## 2020-07-04 DIAGNOSIS — K650 Generalized (acute) peritonitis: Principal | ICD-10-CM

## 2020-07-04 DIAGNOSIS — D849 Immunodeficiency, unspecified: Principal | ICD-10-CM

## 2020-07-04 DIAGNOSIS — F05 Delirium due to known physiological condition: Principal | ICD-10-CM

## 2020-07-04 DIAGNOSIS — K5900 Constipation, unspecified: Principal | ICD-10-CM

## 2020-07-04 DIAGNOSIS — J9809 Other diseases of bronchus, not elsewhere classified: Principal | ICD-10-CM

## 2020-07-08 DIAGNOSIS — K921 Melena: Principal | ICD-10-CM

## 2020-07-08 DIAGNOSIS — E1165 Type 2 diabetes mellitus with hyperglycemia: Principal | ICD-10-CM

## 2020-07-08 DIAGNOSIS — E872 Acidosis: Principal | ICD-10-CM

## 2020-07-08 DIAGNOSIS — J948 Other specified pleural conditions: Principal | ICD-10-CM

## 2020-07-08 DIAGNOSIS — M7989 Other specified soft tissue disorders: Principal | ICD-10-CM

## 2020-07-08 DIAGNOSIS — K9171 Accidental puncture and laceration of a digestive system organ or structure during a digestive system procedure: Principal | ICD-10-CM

## 2020-07-08 DIAGNOSIS — E785 Hyperlipidemia, unspecified: Principal | ICD-10-CM

## 2020-07-08 DIAGNOSIS — N186 End stage renal disease: Principal | ICD-10-CM

## 2020-07-08 DIAGNOSIS — I2510 Atherosclerotic heart disease of native coronary artery without angina pectoris: Principal | ICD-10-CM

## 2020-07-08 DIAGNOSIS — R680 Hypothermia, not associated with low environmental temperature: Principal | ICD-10-CM

## 2020-07-08 DIAGNOSIS — J9819 Other pulmonary collapse: Principal | ICD-10-CM

## 2020-07-08 DIAGNOSIS — I255 Ischemic cardiomyopathy: Principal | ICD-10-CM

## 2020-07-08 DIAGNOSIS — S27309A Unspecified injury of lung, unspecified, initial encounter: Principal | ICD-10-CM

## 2020-07-08 DIAGNOSIS — E878 Other disorders of electrolyte and fluid balance, not elsewhere classified: Principal | ICD-10-CM

## 2020-07-08 DIAGNOSIS — J90 Pleural effusion, not elsewhere classified: Principal | ICD-10-CM

## 2020-07-08 DIAGNOSIS — F418 Other specified anxiety disorders: Principal | ICD-10-CM

## 2020-07-08 DIAGNOSIS — K2100 Gastro-esophageal reflux disease with esophagitis, without bleeding: Principal | ICD-10-CM

## 2020-07-08 DIAGNOSIS — D631 Anemia in chronic kidney disease: Principal | ICD-10-CM

## 2020-07-08 DIAGNOSIS — A4153 Sepsis due to Serratia: Principal | ICD-10-CM

## 2020-07-08 DIAGNOSIS — M109 Gout, unspecified: Principal | ICD-10-CM

## 2020-07-08 DIAGNOSIS — J95851 Ventilator associated pneumonia: Principal | ICD-10-CM

## 2020-07-08 DIAGNOSIS — K632 Fistula of intestine: Principal | ICD-10-CM

## 2020-07-08 DIAGNOSIS — J95821 Acute postprocedural respiratory failure: Principal | ICD-10-CM

## 2020-07-08 DIAGNOSIS — J986 Disorders of diaphragm: Principal | ICD-10-CM

## 2020-07-08 DIAGNOSIS — G253 Myoclonus: Principal | ICD-10-CM

## 2020-07-08 DIAGNOSIS — Z7982 Long term (current) use of aspirin: Principal | ICD-10-CM

## 2020-07-08 DIAGNOSIS — K449 Diaphragmatic hernia without obstruction or gangrene: Principal | ICD-10-CM

## 2020-07-08 DIAGNOSIS — N170 Acute kidney failure with tubular necrosis: Principal | ICD-10-CM

## 2020-07-08 DIAGNOSIS — B259 Cytomegaloviral disease, unspecified: Principal | ICD-10-CM

## 2020-07-08 DIAGNOSIS — J9809 Other diseases of bronchus, not elsewhere classified: Principal | ICD-10-CM

## 2020-07-08 DIAGNOSIS — K5900 Constipation, unspecified: Principal | ICD-10-CM

## 2020-07-08 DIAGNOSIS — T50905A Adverse effect of unspecified drugs, medicaments and biological substances, initial encounter: Principal | ICD-10-CM

## 2020-07-08 DIAGNOSIS — I5084 End stage heart failure: Principal | ICD-10-CM

## 2020-07-08 DIAGNOSIS — I129 Hypertensive chronic kidney disease with stage 1 through stage 4 chronic kidney disease, or unspecified chronic kidney disease: Principal | ICD-10-CM

## 2020-07-08 DIAGNOSIS — Z95811 Presence of heart assist device: Principal | ICD-10-CM

## 2020-07-08 DIAGNOSIS — J151 Pneumonia due to Pseudomonas: Principal | ICD-10-CM

## 2020-07-08 DIAGNOSIS — K9423 Gastrostomy malfunction: Principal | ICD-10-CM

## 2020-07-08 DIAGNOSIS — R17 Unspecified jaundice: Principal | ICD-10-CM

## 2020-07-08 DIAGNOSIS — I9589 Other hypotension: Principal | ICD-10-CM

## 2020-07-08 DIAGNOSIS — Z85828 Personal history of other malignant neoplasm of skin: Principal | ICD-10-CM

## 2020-07-08 DIAGNOSIS — I5022 Chronic systolic (congestive) heart failure: Principal | ICD-10-CM

## 2020-07-08 DIAGNOSIS — F05 Delirium due to known physiological condition: Principal | ICD-10-CM

## 2020-07-08 DIAGNOSIS — I480 Paroxysmal atrial fibrillation: Principal | ICD-10-CM

## 2020-07-08 DIAGNOSIS — K650 Generalized (acute) peritonitis: Principal | ICD-10-CM

## 2020-07-08 DIAGNOSIS — I252 Old myocardial infarction: Principal | ICD-10-CM

## 2020-07-08 DIAGNOSIS — E1122 Type 2 diabetes mellitus with diabetic chronic kidney disease: Principal | ICD-10-CM

## 2020-07-08 DIAGNOSIS — B371 Pulmonary candidiasis: Principal | ICD-10-CM

## 2020-07-08 DIAGNOSIS — D849 Immunodeficiency, unspecified: Principal | ICD-10-CM

## 2020-07-08 DIAGNOSIS — Z992 Dependence on renal dialysis: Principal | ICD-10-CM

## 2020-07-08 DIAGNOSIS — K4090 Unilateral inguinal hernia, without obstruction or gangrene, not specified as recurrent: Principal | ICD-10-CM

## 2020-07-08 DIAGNOSIS — E875 Hyperkalemia: Principal | ICD-10-CM

## 2020-07-08 DIAGNOSIS — E8770 Fluid overload, unspecified: Principal | ICD-10-CM

## 2020-07-08 DIAGNOSIS — J851 Abscess of lung with pneumonia: Principal | ICD-10-CM

## 2020-07-08 DIAGNOSIS — R188 Other ascites: Principal | ICD-10-CM

## 2020-07-08 DIAGNOSIS — J982 Interstitial emphysema: Principal | ICD-10-CM

## 2020-07-08 DIAGNOSIS — I97418 Intraoperative hemorrhage and hematoma of a circulatory system organ or structure complicating other circulatory system procedure: Principal | ICD-10-CM

## 2020-07-08 DIAGNOSIS — G92 Toxic encephalopathy: Principal | ICD-10-CM

## 2020-07-08 DIAGNOSIS — R1110 Vomiting, unspecified: Principal | ICD-10-CM

## 2020-07-08 DIAGNOSIS — D62 Acute posthemorrhagic anemia: Principal | ICD-10-CM

## 2020-07-08 DIAGNOSIS — Z7901 Long term (current) use of anticoagulants: Principal | ICD-10-CM

## 2020-07-08 DIAGNOSIS — R6521 Severe sepsis with septic shock: Principal | ICD-10-CM

## 2020-07-08 DIAGNOSIS — J156 Pneumonia due to other aerobic Gram-negative bacteria: Principal | ICD-10-CM

## 2020-07-08 DIAGNOSIS — T8131XA Disruption of external operation (surgical) wound, not elsewhere classified, initial encounter: Principal | ICD-10-CM

## 2020-07-08 DIAGNOSIS — D689 Coagulation defect, unspecified: Principal | ICD-10-CM

## 2020-07-08 DIAGNOSIS — D696 Thrombocytopenia, unspecified: Principal | ICD-10-CM

## 2020-07-08 DIAGNOSIS — R570 Cardiogenic shock: Principal | ICD-10-CM

## 2020-07-08 DIAGNOSIS — T451X5A Adverse effect of antineoplastic and immunosuppressive drugs, initial encounter: Principal | ICD-10-CM

## 2020-07-08 DIAGNOSIS — Z955 Presence of coronary angioplasty implant and graft: Principal | ICD-10-CM

## 2020-07-10 DIAGNOSIS — E785 Hyperlipidemia, unspecified: Principal | ICD-10-CM

## 2020-07-10 DIAGNOSIS — E8770 Fluid overload, unspecified: Principal | ICD-10-CM

## 2020-07-10 DIAGNOSIS — I9589 Other hypotension: Principal | ICD-10-CM

## 2020-07-10 DIAGNOSIS — K5900 Constipation, unspecified: Principal | ICD-10-CM

## 2020-07-10 DIAGNOSIS — M109 Gout, unspecified: Principal | ICD-10-CM

## 2020-07-10 DIAGNOSIS — J151 Pneumonia due to Pseudomonas: Principal | ICD-10-CM

## 2020-07-10 DIAGNOSIS — K4090 Unilateral inguinal hernia, without obstruction or gangrene, not specified as recurrent: Principal | ICD-10-CM

## 2020-07-10 DIAGNOSIS — E878 Other disorders of electrolyte and fluid balance, not elsewhere classified: Principal | ICD-10-CM

## 2020-07-10 DIAGNOSIS — R188 Other ascites: Principal | ICD-10-CM

## 2020-07-10 DIAGNOSIS — K650 Generalized (acute) peritonitis: Principal | ICD-10-CM

## 2020-07-10 DIAGNOSIS — E1165 Type 2 diabetes mellitus with hyperglycemia: Principal | ICD-10-CM

## 2020-07-10 DIAGNOSIS — J90 Pleural effusion, not elsewhere classified: Principal | ICD-10-CM

## 2020-07-10 DIAGNOSIS — R570 Cardiogenic shock: Principal | ICD-10-CM

## 2020-07-10 DIAGNOSIS — F05 Delirium due to known physiological condition: Principal | ICD-10-CM

## 2020-07-10 DIAGNOSIS — I252 Old myocardial infarction: Principal | ICD-10-CM

## 2020-07-10 DIAGNOSIS — I5022 Chronic systolic (congestive) heart failure: Principal | ICD-10-CM

## 2020-07-10 DIAGNOSIS — J986 Disorders of diaphragm: Principal | ICD-10-CM

## 2020-07-10 DIAGNOSIS — I255 Ischemic cardiomyopathy: Principal | ICD-10-CM

## 2020-07-10 DIAGNOSIS — T451X5A Adverse effect of antineoplastic and immunosuppressive drugs, initial encounter: Principal | ICD-10-CM

## 2020-07-10 DIAGNOSIS — J156 Pneumonia due to other aerobic Gram-negative bacteria: Principal | ICD-10-CM

## 2020-07-10 DIAGNOSIS — E1122 Type 2 diabetes mellitus with diabetic chronic kidney disease: Principal | ICD-10-CM

## 2020-07-10 DIAGNOSIS — D62 Acute posthemorrhagic anemia: Principal | ICD-10-CM

## 2020-07-10 DIAGNOSIS — Z955 Presence of coronary angioplasty implant and graft: Principal | ICD-10-CM

## 2020-07-10 DIAGNOSIS — B371 Pulmonary candidiasis: Principal | ICD-10-CM

## 2020-07-10 DIAGNOSIS — A4153 Sepsis due to Serratia: Principal | ICD-10-CM

## 2020-07-10 DIAGNOSIS — D689 Coagulation defect, unspecified: Principal | ICD-10-CM

## 2020-07-10 DIAGNOSIS — M7989 Other specified soft tissue disorders: Principal | ICD-10-CM

## 2020-07-10 DIAGNOSIS — K449 Diaphragmatic hernia without obstruction or gangrene: Principal | ICD-10-CM

## 2020-07-10 DIAGNOSIS — T8131XA Disruption of external operation (surgical) wound, not elsewhere classified, initial encounter: Principal | ICD-10-CM

## 2020-07-10 DIAGNOSIS — R6521 Severe sepsis with septic shock: Principal | ICD-10-CM

## 2020-07-10 DIAGNOSIS — N186 End stage renal disease: Principal | ICD-10-CM

## 2020-07-10 DIAGNOSIS — T50905A Adverse effect of unspecified drugs, medicaments and biological substances, initial encounter: Principal | ICD-10-CM

## 2020-07-10 DIAGNOSIS — I129 Hypertensive chronic kidney disease with stage 1 through stage 4 chronic kidney disease, or unspecified chronic kidney disease: Principal | ICD-10-CM

## 2020-07-10 DIAGNOSIS — J9819 Other pulmonary collapse: Principal | ICD-10-CM

## 2020-07-10 DIAGNOSIS — S27309A Unspecified injury of lung, unspecified, initial encounter: Principal | ICD-10-CM

## 2020-07-10 DIAGNOSIS — F418 Other specified anxiety disorders: Principal | ICD-10-CM

## 2020-07-10 DIAGNOSIS — Z95811 Presence of heart assist device: Principal | ICD-10-CM

## 2020-07-10 DIAGNOSIS — I5084 End stage heart failure: Principal | ICD-10-CM

## 2020-07-10 DIAGNOSIS — J9809 Other diseases of bronchus, not elsewhere classified: Principal | ICD-10-CM

## 2020-07-10 DIAGNOSIS — D849 Immunodeficiency, unspecified: Principal | ICD-10-CM

## 2020-07-10 DIAGNOSIS — K9423 Gastrostomy malfunction: Principal | ICD-10-CM

## 2020-07-10 DIAGNOSIS — I480 Paroxysmal atrial fibrillation: Principal | ICD-10-CM

## 2020-07-10 DIAGNOSIS — E875 Hyperkalemia: Principal | ICD-10-CM

## 2020-07-10 DIAGNOSIS — B259 Cytomegaloviral disease, unspecified: Principal | ICD-10-CM

## 2020-07-10 DIAGNOSIS — R17 Unspecified jaundice: Principal | ICD-10-CM

## 2020-07-10 DIAGNOSIS — K921 Melena: Principal | ICD-10-CM

## 2020-07-10 DIAGNOSIS — E872 Acidosis: Principal | ICD-10-CM

## 2020-07-10 DIAGNOSIS — J851 Abscess of lung with pneumonia: Principal | ICD-10-CM

## 2020-07-10 DIAGNOSIS — G253 Myoclonus: Principal | ICD-10-CM

## 2020-07-10 DIAGNOSIS — N170 Acute kidney failure with tubular necrosis: Principal | ICD-10-CM

## 2020-07-10 DIAGNOSIS — J948 Other specified pleural conditions: Principal | ICD-10-CM

## 2020-07-10 DIAGNOSIS — Z7901 Long term (current) use of anticoagulants: Principal | ICD-10-CM

## 2020-07-10 DIAGNOSIS — I97418 Intraoperative hemorrhage and hematoma of a circulatory system organ or structure complicating other circulatory system procedure: Principal | ICD-10-CM

## 2020-07-10 DIAGNOSIS — Z85828 Personal history of other malignant neoplasm of skin: Principal | ICD-10-CM

## 2020-07-10 DIAGNOSIS — K2100 Gastro-esophageal reflux disease with esophagitis, without bleeding: Principal | ICD-10-CM

## 2020-07-10 DIAGNOSIS — I2510 Atherosclerotic heart disease of native coronary artery without angina pectoris: Principal | ICD-10-CM

## 2020-07-10 DIAGNOSIS — Z992 Dependence on renal dialysis: Principal | ICD-10-CM

## 2020-07-10 DIAGNOSIS — D696 Thrombocytopenia, unspecified: Principal | ICD-10-CM

## 2020-07-10 DIAGNOSIS — R680 Hypothermia, not associated with low environmental temperature: Principal | ICD-10-CM

## 2020-07-10 DIAGNOSIS — J95851 Ventilator associated pneumonia: Principal | ICD-10-CM

## 2020-07-10 DIAGNOSIS — K632 Fistula of intestine: Principal | ICD-10-CM

## 2020-07-10 DIAGNOSIS — K9171 Accidental puncture and laceration of a digestive system organ or structure during a digestive system procedure: Principal | ICD-10-CM

## 2020-07-10 DIAGNOSIS — G92 Toxic encephalopathy: Principal | ICD-10-CM

## 2020-07-10 DIAGNOSIS — J95821 Acute postprocedural respiratory failure: Principal | ICD-10-CM

## 2020-07-10 DIAGNOSIS — J982 Interstitial emphysema: Principal | ICD-10-CM

## 2020-07-10 DIAGNOSIS — D631 Anemia in chronic kidney disease: Principal | ICD-10-CM

## 2020-07-10 DIAGNOSIS — Z7982 Long term (current) use of aspirin: Principal | ICD-10-CM

## 2020-07-10 DIAGNOSIS — R1110 Vomiting, unspecified: Principal | ICD-10-CM

## 2020-07-11 DIAGNOSIS — Z95811 Presence of heart assist device: Principal | ICD-10-CM

## 2020-07-11 DIAGNOSIS — Z7901 Long term (current) use of anticoagulants: Principal | ICD-10-CM

## 2020-07-11 DIAGNOSIS — T451X5A Adverse effect of antineoplastic and immunosuppressive drugs, initial encounter: Principal | ICD-10-CM

## 2020-07-11 DIAGNOSIS — A4153 Sepsis due to Serratia: Principal | ICD-10-CM

## 2020-07-11 DIAGNOSIS — J95851 Ventilator associated pneumonia: Principal | ICD-10-CM

## 2020-07-11 DIAGNOSIS — J9809 Other diseases of bronchus, not elsewhere classified: Principal | ICD-10-CM

## 2020-07-11 DIAGNOSIS — K5900 Constipation, unspecified: Principal | ICD-10-CM

## 2020-07-11 DIAGNOSIS — J156 Pneumonia due to other aerobic Gram-negative bacteria: Principal | ICD-10-CM

## 2020-07-11 DIAGNOSIS — R1110 Vomiting, unspecified: Principal | ICD-10-CM

## 2020-07-11 DIAGNOSIS — J95821 Acute postprocedural respiratory failure: Principal | ICD-10-CM

## 2020-07-11 DIAGNOSIS — K9423 Gastrostomy malfunction: Principal | ICD-10-CM

## 2020-07-11 DIAGNOSIS — K632 Fistula of intestine: Principal | ICD-10-CM

## 2020-07-11 DIAGNOSIS — E1165 Type 2 diabetes mellitus with hyperglycemia: Principal | ICD-10-CM

## 2020-07-11 DIAGNOSIS — I5084 End stage heart failure: Principal | ICD-10-CM

## 2020-07-11 DIAGNOSIS — I2510 Atherosclerotic heart disease of native coronary artery without angina pectoris: Principal | ICD-10-CM

## 2020-07-11 DIAGNOSIS — D631 Anemia in chronic kidney disease: Principal | ICD-10-CM

## 2020-07-11 DIAGNOSIS — K2100 Gastro-esophageal reflux disease with esophagitis, without bleeding: Principal | ICD-10-CM

## 2020-07-11 DIAGNOSIS — J982 Interstitial emphysema: Principal | ICD-10-CM

## 2020-07-11 DIAGNOSIS — E785 Hyperlipidemia, unspecified: Principal | ICD-10-CM

## 2020-07-11 DIAGNOSIS — M7989 Other specified soft tissue disorders: Principal | ICD-10-CM

## 2020-07-11 DIAGNOSIS — I480 Paroxysmal atrial fibrillation: Principal | ICD-10-CM

## 2020-07-11 DIAGNOSIS — I255 Ischemic cardiomyopathy: Principal | ICD-10-CM

## 2020-07-11 DIAGNOSIS — Z85828 Personal history of other malignant neoplasm of skin: Principal | ICD-10-CM

## 2020-07-11 DIAGNOSIS — I129 Hypertensive chronic kidney disease with stage 1 through stage 4 chronic kidney disease, or unspecified chronic kidney disease: Principal | ICD-10-CM

## 2020-07-11 DIAGNOSIS — F05 Delirium due to known physiological condition: Principal | ICD-10-CM

## 2020-07-11 DIAGNOSIS — K449 Diaphragmatic hernia without obstruction or gangrene: Principal | ICD-10-CM

## 2020-07-11 DIAGNOSIS — G92 Toxic encephalopathy: Principal | ICD-10-CM

## 2020-07-11 DIAGNOSIS — Z7982 Long term (current) use of aspirin: Principal | ICD-10-CM

## 2020-07-11 DIAGNOSIS — K9171 Accidental puncture and laceration of a digestive system organ or structure during a digestive system procedure: Principal | ICD-10-CM

## 2020-07-11 DIAGNOSIS — E8770 Fluid overload, unspecified: Principal | ICD-10-CM

## 2020-07-11 DIAGNOSIS — K921 Melena: Principal | ICD-10-CM

## 2020-07-11 DIAGNOSIS — J851 Abscess of lung with pneumonia: Principal | ICD-10-CM

## 2020-07-11 DIAGNOSIS — R680 Hypothermia, not associated with low environmental temperature: Principal | ICD-10-CM

## 2020-07-11 DIAGNOSIS — I5022 Chronic systolic (congestive) heart failure: Principal | ICD-10-CM

## 2020-07-11 DIAGNOSIS — D849 Immunodeficiency, unspecified: Principal | ICD-10-CM

## 2020-07-11 DIAGNOSIS — T50905A Adverse effect of unspecified drugs, medicaments and biological substances, initial encounter: Principal | ICD-10-CM

## 2020-07-11 DIAGNOSIS — K4090 Unilateral inguinal hernia, without obstruction or gangrene, not specified as recurrent: Principal | ICD-10-CM

## 2020-07-11 DIAGNOSIS — I252 Old myocardial infarction: Principal | ICD-10-CM

## 2020-07-11 DIAGNOSIS — D689 Coagulation defect, unspecified: Principal | ICD-10-CM

## 2020-07-11 DIAGNOSIS — Z992 Dependence on renal dialysis: Principal | ICD-10-CM

## 2020-07-11 DIAGNOSIS — E878 Other disorders of electrolyte and fluid balance, not elsewhere classified: Principal | ICD-10-CM

## 2020-07-11 DIAGNOSIS — I97418 Intraoperative hemorrhage and hematoma of a circulatory system organ or structure complicating other circulatory system procedure: Principal | ICD-10-CM

## 2020-07-11 DIAGNOSIS — D62 Acute posthemorrhagic anemia: Principal | ICD-10-CM

## 2020-07-11 DIAGNOSIS — J986 Disorders of diaphragm: Principal | ICD-10-CM

## 2020-07-11 DIAGNOSIS — J90 Pleural effusion, not elsewhere classified: Principal | ICD-10-CM

## 2020-07-11 DIAGNOSIS — T8131XA Disruption of external operation (surgical) wound, not elsewhere classified, initial encounter: Principal | ICD-10-CM

## 2020-07-11 DIAGNOSIS — N186 End stage renal disease: Principal | ICD-10-CM

## 2020-07-11 DIAGNOSIS — E875 Hyperkalemia: Principal | ICD-10-CM

## 2020-07-11 DIAGNOSIS — F418 Other specified anxiety disorders: Principal | ICD-10-CM

## 2020-07-11 DIAGNOSIS — G253 Myoclonus: Principal | ICD-10-CM

## 2020-07-11 DIAGNOSIS — N170 Acute kidney failure with tubular necrosis: Principal | ICD-10-CM

## 2020-07-11 DIAGNOSIS — E1122 Type 2 diabetes mellitus with diabetic chronic kidney disease: Principal | ICD-10-CM

## 2020-07-11 DIAGNOSIS — R188 Other ascites: Principal | ICD-10-CM

## 2020-07-11 DIAGNOSIS — B259 Cytomegaloviral disease, unspecified: Principal | ICD-10-CM

## 2020-07-11 DIAGNOSIS — J151 Pneumonia due to Pseudomonas: Principal | ICD-10-CM

## 2020-07-11 DIAGNOSIS — B371 Pulmonary candidiasis: Principal | ICD-10-CM

## 2020-07-11 DIAGNOSIS — E872 Acidosis: Principal | ICD-10-CM

## 2020-07-11 DIAGNOSIS — K650 Generalized (acute) peritonitis: Principal | ICD-10-CM

## 2020-07-11 DIAGNOSIS — J9819 Other pulmonary collapse: Principal | ICD-10-CM

## 2020-07-11 DIAGNOSIS — D696 Thrombocytopenia, unspecified: Principal | ICD-10-CM

## 2020-07-11 DIAGNOSIS — J948 Other specified pleural conditions: Principal | ICD-10-CM

## 2020-07-11 DIAGNOSIS — R570 Cardiogenic shock: Principal | ICD-10-CM

## 2020-07-11 DIAGNOSIS — I9589 Other hypotension: Principal | ICD-10-CM

## 2020-07-11 DIAGNOSIS — M109 Gout, unspecified: Principal | ICD-10-CM

## 2020-07-11 DIAGNOSIS — S27309A Unspecified injury of lung, unspecified, initial encounter: Principal | ICD-10-CM

## 2020-07-11 DIAGNOSIS — R6521 Severe sepsis with septic shock: Principal | ICD-10-CM

## 2020-07-11 DIAGNOSIS — R17 Unspecified jaundice: Principal | ICD-10-CM

## 2020-07-11 DIAGNOSIS — Z955 Presence of coronary angioplasty implant and graft: Principal | ICD-10-CM

## 2020-07-14 DIAGNOSIS — Z7982 Long term (current) use of aspirin: Principal | ICD-10-CM

## 2020-07-14 DIAGNOSIS — B259 Cytomegaloviral disease, unspecified: Principal | ICD-10-CM

## 2020-07-14 DIAGNOSIS — Z95811 Presence of heart assist device: Principal | ICD-10-CM

## 2020-07-14 DIAGNOSIS — R6521 Severe sepsis with septic shock: Principal | ICD-10-CM

## 2020-07-14 DIAGNOSIS — K4090 Unilateral inguinal hernia, without obstruction or gangrene, not specified as recurrent: Principal | ICD-10-CM

## 2020-07-14 DIAGNOSIS — B371 Pulmonary candidiasis: Principal | ICD-10-CM

## 2020-07-14 DIAGNOSIS — Z85828 Personal history of other malignant neoplasm of skin: Principal | ICD-10-CM

## 2020-07-14 DIAGNOSIS — K5900 Constipation, unspecified: Principal | ICD-10-CM

## 2020-07-14 DIAGNOSIS — J982 Interstitial emphysema: Principal | ICD-10-CM

## 2020-07-14 DIAGNOSIS — A4153 Sepsis due to Serratia: Principal | ICD-10-CM

## 2020-07-14 DIAGNOSIS — R1110 Vomiting, unspecified: Principal | ICD-10-CM

## 2020-07-14 DIAGNOSIS — K632 Fistula of intestine: Principal | ICD-10-CM

## 2020-07-14 DIAGNOSIS — I2510 Atherosclerotic heart disease of native coronary artery without angina pectoris: Principal | ICD-10-CM

## 2020-07-14 DIAGNOSIS — D849 Immunodeficiency, unspecified: Principal | ICD-10-CM

## 2020-07-14 DIAGNOSIS — J948 Other specified pleural conditions: Principal | ICD-10-CM

## 2020-07-14 DIAGNOSIS — D631 Anemia in chronic kidney disease: Principal | ICD-10-CM

## 2020-07-14 DIAGNOSIS — J9809 Other diseases of bronchus, not elsewhere classified: Principal | ICD-10-CM

## 2020-07-14 DIAGNOSIS — E1122 Type 2 diabetes mellitus with diabetic chronic kidney disease: Principal | ICD-10-CM

## 2020-07-14 DIAGNOSIS — T451X5A Adverse effect of antineoplastic and immunosuppressive drugs, initial encounter: Principal | ICD-10-CM

## 2020-07-14 DIAGNOSIS — J851 Abscess of lung with pneumonia: Principal | ICD-10-CM

## 2020-07-14 DIAGNOSIS — I255 Ischemic cardiomyopathy: Principal | ICD-10-CM

## 2020-07-14 DIAGNOSIS — E875 Hyperkalemia: Principal | ICD-10-CM

## 2020-07-14 DIAGNOSIS — J95821 Acute postprocedural respiratory failure: Principal | ICD-10-CM

## 2020-07-14 DIAGNOSIS — K2100 Gastro-esophageal reflux disease with esophagitis, without bleeding: Principal | ICD-10-CM

## 2020-07-14 DIAGNOSIS — K650 Generalized (acute) peritonitis: Principal | ICD-10-CM

## 2020-07-14 DIAGNOSIS — F418 Other specified anxiety disorders: Principal | ICD-10-CM

## 2020-07-14 DIAGNOSIS — R17 Unspecified jaundice: Principal | ICD-10-CM

## 2020-07-14 DIAGNOSIS — N170 Acute kidney failure with tubular necrosis: Principal | ICD-10-CM

## 2020-07-14 DIAGNOSIS — J156 Pneumonia due to other aerobic Gram-negative bacteria: Principal | ICD-10-CM

## 2020-07-14 DIAGNOSIS — I97418 Intraoperative hemorrhage and hematoma of a circulatory system organ or structure complicating other circulatory system procedure: Principal | ICD-10-CM

## 2020-07-14 DIAGNOSIS — M7989 Other specified soft tissue disorders: Principal | ICD-10-CM

## 2020-07-14 DIAGNOSIS — J95851 Ventilator associated pneumonia: Principal | ICD-10-CM

## 2020-07-14 DIAGNOSIS — D62 Acute posthemorrhagic anemia: Principal | ICD-10-CM

## 2020-07-14 DIAGNOSIS — R188 Other ascites: Principal | ICD-10-CM

## 2020-07-14 DIAGNOSIS — K921 Melena: Principal | ICD-10-CM

## 2020-07-14 DIAGNOSIS — I252 Old myocardial infarction: Principal | ICD-10-CM

## 2020-07-14 DIAGNOSIS — I480 Paroxysmal atrial fibrillation: Principal | ICD-10-CM

## 2020-07-14 DIAGNOSIS — R570 Cardiogenic shock: Principal | ICD-10-CM

## 2020-07-14 DIAGNOSIS — J151 Pneumonia due to Pseudomonas: Principal | ICD-10-CM

## 2020-07-14 DIAGNOSIS — J9819 Other pulmonary collapse: Principal | ICD-10-CM

## 2020-07-14 DIAGNOSIS — I5022 Chronic systolic (congestive) heart failure: Principal | ICD-10-CM

## 2020-07-14 DIAGNOSIS — I5084 End stage heart failure: Principal | ICD-10-CM

## 2020-07-14 DIAGNOSIS — M109 Gout, unspecified: Principal | ICD-10-CM

## 2020-07-14 DIAGNOSIS — S27309A Unspecified injury of lung, unspecified, initial encounter: Principal | ICD-10-CM

## 2020-07-14 DIAGNOSIS — K449 Diaphragmatic hernia without obstruction or gangrene: Principal | ICD-10-CM

## 2020-07-14 DIAGNOSIS — T8131XA Disruption of external operation (surgical) wound, not elsewhere classified, initial encounter: Principal | ICD-10-CM

## 2020-07-14 DIAGNOSIS — E878 Other disorders of electrolyte and fluid balance, not elsewhere classified: Principal | ICD-10-CM

## 2020-07-14 DIAGNOSIS — J90 Pleural effusion, not elsewhere classified: Principal | ICD-10-CM

## 2020-07-14 DIAGNOSIS — T50905A Adverse effect of unspecified drugs, medicaments and biological substances, initial encounter: Principal | ICD-10-CM

## 2020-07-14 DIAGNOSIS — E8770 Fluid overload, unspecified: Principal | ICD-10-CM

## 2020-07-14 DIAGNOSIS — K9423 Gastrostomy malfunction: Principal | ICD-10-CM

## 2020-07-14 DIAGNOSIS — D696 Thrombocytopenia, unspecified: Principal | ICD-10-CM

## 2020-07-14 DIAGNOSIS — G92 Toxic encephalopathy: Principal | ICD-10-CM

## 2020-07-14 DIAGNOSIS — E1165 Type 2 diabetes mellitus with hyperglycemia: Principal | ICD-10-CM

## 2020-07-14 DIAGNOSIS — I129 Hypertensive chronic kidney disease with stage 1 through stage 4 chronic kidney disease, or unspecified chronic kidney disease: Principal | ICD-10-CM

## 2020-07-14 DIAGNOSIS — R680 Hypothermia, not associated with low environmental temperature: Principal | ICD-10-CM

## 2020-07-14 DIAGNOSIS — E785 Hyperlipidemia, unspecified: Principal | ICD-10-CM

## 2020-07-14 DIAGNOSIS — Z955 Presence of coronary angioplasty implant and graft: Principal | ICD-10-CM

## 2020-07-14 DIAGNOSIS — I9589 Other hypotension: Principal | ICD-10-CM

## 2020-07-14 DIAGNOSIS — E872 Acidosis: Principal | ICD-10-CM

## 2020-07-14 DIAGNOSIS — Z992 Dependence on renal dialysis: Principal | ICD-10-CM

## 2020-07-14 DIAGNOSIS — N186 End stage renal disease: Principal | ICD-10-CM

## 2020-07-14 DIAGNOSIS — F05 Delirium due to known physiological condition: Principal | ICD-10-CM

## 2020-07-14 DIAGNOSIS — D689 Coagulation defect, unspecified: Principal | ICD-10-CM

## 2020-07-14 DIAGNOSIS — G253 Myoclonus: Principal | ICD-10-CM

## 2020-07-14 DIAGNOSIS — J986 Disorders of diaphragm: Principal | ICD-10-CM

## 2020-07-14 DIAGNOSIS — Z7901 Long term (current) use of anticoagulants: Principal | ICD-10-CM

## 2020-07-14 DIAGNOSIS — K9171 Accidental puncture and laceration of a digestive system organ or structure during a digestive system procedure: Principal | ICD-10-CM

## 2020-07-16 DIAGNOSIS — K921 Melena: Principal | ICD-10-CM

## 2020-07-16 DIAGNOSIS — F418 Other specified anxiety disorders: Principal | ICD-10-CM

## 2020-07-16 DIAGNOSIS — G253 Myoclonus: Principal | ICD-10-CM

## 2020-07-16 DIAGNOSIS — D689 Coagulation defect, unspecified: Principal | ICD-10-CM

## 2020-07-16 DIAGNOSIS — K5900 Constipation, unspecified: Principal | ICD-10-CM

## 2020-07-16 DIAGNOSIS — J9809 Other diseases of bronchus, not elsewhere classified: Principal | ICD-10-CM

## 2020-07-16 DIAGNOSIS — K9171 Accidental puncture and laceration of a digestive system organ or structure during a digestive system procedure: Principal | ICD-10-CM

## 2020-07-16 DIAGNOSIS — I97418 Intraoperative hemorrhage and hematoma of a circulatory system organ or structure complicating other circulatory system procedure: Principal | ICD-10-CM

## 2020-07-16 DIAGNOSIS — Z95811 Presence of heart assist device: Principal | ICD-10-CM

## 2020-07-16 DIAGNOSIS — S27309A Unspecified injury of lung, unspecified, initial encounter: Principal | ICD-10-CM

## 2020-07-16 DIAGNOSIS — T50905A Adverse effect of unspecified drugs, medicaments and biological substances, initial encounter: Principal | ICD-10-CM

## 2020-07-16 DIAGNOSIS — D696 Thrombocytopenia, unspecified: Principal | ICD-10-CM

## 2020-07-16 DIAGNOSIS — J90 Pleural effusion, not elsewhere classified: Principal | ICD-10-CM

## 2020-07-16 DIAGNOSIS — D631 Anemia in chronic kidney disease: Principal | ICD-10-CM

## 2020-07-16 DIAGNOSIS — R680 Hypothermia, not associated with low environmental temperature: Principal | ICD-10-CM

## 2020-07-16 DIAGNOSIS — M109 Gout, unspecified: Principal | ICD-10-CM

## 2020-07-16 DIAGNOSIS — I9589 Other hypotension: Principal | ICD-10-CM

## 2020-07-16 DIAGNOSIS — Z7901 Long term (current) use of anticoagulants: Principal | ICD-10-CM

## 2020-07-16 DIAGNOSIS — R17 Unspecified jaundice: Principal | ICD-10-CM

## 2020-07-16 DIAGNOSIS — T451X5A Adverse effect of antineoplastic and immunosuppressive drugs, initial encounter: Principal | ICD-10-CM

## 2020-07-16 DIAGNOSIS — E878 Other disorders of electrolyte and fluid balance, not elsewhere classified: Principal | ICD-10-CM

## 2020-07-16 DIAGNOSIS — I255 Ischemic cardiomyopathy: Principal | ICD-10-CM

## 2020-07-16 DIAGNOSIS — Z992 Dependence on renal dialysis: Principal | ICD-10-CM

## 2020-07-16 DIAGNOSIS — Z955 Presence of coronary angioplasty implant and graft: Principal | ICD-10-CM

## 2020-07-16 DIAGNOSIS — K650 Generalized (acute) peritonitis: Principal | ICD-10-CM

## 2020-07-16 DIAGNOSIS — I129 Hypertensive chronic kidney disease with stage 1 through stage 4 chronic kidney disease, or unspecified chronic kidney disease: Principal | ICD-10-CM

## 2020-07-16 DIAGNOSIS — J95851 Ventilator associated pneumonia: Principal | ICD-10-CM

## 2020-07-16 DIAGNOSIS — I5084 End stage heart failure: Principal | ICD-10-CM

## 2020-07-16 DIAGNOSIS — J9819 Other pulmonary collapse: Principal | ICD-10-CM

## 2020-07-16 DIAGNOSIS — M7989 Other specified soft tissue disorders: Principal | ICD-10-CM

## 2020-07-16 DIAGNOSIS — R188 Other ascites: Principal | ICD-10-CM

## 2020-07-16 DIAGNOSIS — K632 Fistula of intestine: Principal | ICD-10-CM

## 2020-07-16 DIAGNOSIS — T8131XA Disruption of external operation (surgical) wound, not elsewhere classified, initial encounter: Principal | ICD-10-CM

## 2020-07-16 DIAGNOSIS — A4153 Sepsis due to Serratia: Principal | ICD-10-CM

## 2020-07-16 DIAGNOSIS — J151 Pneumonia due to Pseudomonas: Principal | ICD-10-CM

## 2020-07-16 DIAGNOSIS — B259 Cytomegaloviral disease, unspecified: Principal | ICD-10-CM

## 2020-07-16 DIAGNOSIS — J156 Pneumonia due to other aerobic Gram-negative bacteria: Principal | ICD-10-CM

## 2020-07-16 DIAGNOSIS — I2510 Atherosclerotic heart disease of native coronary artery without angina pectoris: Principal | ICD-10-CM

## 2020-07-16 DIAGNOSIS — K449 Diaphragmatic hernia without obstruction or gangrene: Principal | ICD-10-CM

## 2020-07-16 DIAGNOSIS — B371 Pulmonary candidiasis: Principal | ICD-10-CM

## 2020-07-16 DIAGNOSIS — E875 Hyperkalemia: Principal | ICD-10-CM

## 2020-07-16 DIAGNOSIS — Z85828 Personal history of other malignant neoplasm of skin: Principal | ICD-10-CM

## 2020-07-16 DIAGNOSIS — K4090 Unilateral inguinal hernia, without obstruction or gangrene, not specified as recurrent: Principal | ICD-10-CM

## 2020-07-16 DIAGNOSIS — Z7982 Long term (current) use of aspirin: Principal | ICD-10-CM

## 2020-07-16 DIAGNOSIS — J986 Disorders of diaphragm: Principal | ICD-10-CM

## 2020-07-16 DIAGNOSIS — E1122 Type 2 diabetes mellitus with diabetic chronic kidney disease: Principal | ICD-10-CM

## 2020-07-16 DIAGNOSIS — G92 Toxic encephalopathy: Principal | ICD-10-CM

## 2020-07-16 DIAGNOSIS — I252 Old myocardial infarction: Principal | ICD-10-CM

## 2020-07-16 DIAGNOSIS — K2100 Gastro-esophageal reflux disease with esophagitis, without bleeding: Principal | ICD-10-CM

## 2020-07-16 DIAGNOSIS — J982 Interstitial emphysema: Principal | ICD-10-CM

## 2020-07-16 DIAGNOSIS — N170 Acute kidney failure with tubular necrosis: Principal | ICD-10-CM

## 2020-07-16 DIAGNOSIS — R570 Cardiogenic shock: Principal | ICD-10-CM

## 2020-07-16 DIAGNOSIS — E785 Hyperlipidemia, unspecified: Principal | ICD-10-CM

## 2020-07-16 DIAGNOSIS — F05 Delirium due to known physiological condition: Principal | ICD-10-CM

## 2020-07-16 DIAGNOSIS — N186 End stage renal disease: Principal | ICD-10-CM

## 2020-07-16 DIAGNOSIS — I5022 Chronic systolic (congestive) heart failure: Principal | ICD-10-CM

## 2020-07-16 DIAGNOSIS — D62 Acute posthemorrhagic anemia: Principal | ICD-10-CM

## 2020-07-16 DIAGNOSIS — J851 Abscess of lung with pneumonia: Principal | ICD-10-CM

## 2020-07-16 DIAGNOSIS — J95821 Acute postprocedural respiratory failure: Principal | ICD-10-CM

## 2020-07-16 DIAGNOSIS — E1165 Type 2 diabetes mellitus with hyperglycemia: Principal | ICD-10-CM

## 2020-07-16 DIAGNOSIS — J948 Other specified pleural conditions: Principal | ICD-10-CM

## 2020-07-16 DIAGNOSIS — E872 Acidosis: Principal | ICD-10-CM

## 2020-07-16 DIAGNOSIS — K9423 Gastrostomy malfunction: Principal | ICD-10-CM

## 2020-07-16 DIAGNOSIS — I480 Paroxysmal atrial fibrillation: Principal | ICD-10-CM

## 2020-07-16 DIAGNOSIS — R6521 Severe sepsis with septic shock: Principal | ICD-10-CM

## 2020-07-16 DIAGNOSIS — D849 Immunodeficiency, unspecified: Principal | ICD-10-CM

## 2020-07-16 DIAGNOSIS — E8770 Fluid overload, unspecified: Principal | ICD-10-CM

## 2020-07-16 DIAGNOSIS — R1110 Vomiting, unspecified: Principal | ICD-10-CM

## 2020-07-18 DIAGNOSIS — G92 Toxic encephalopathy: Principal | ICD-10-CM

## 2020-07-18 DIAGNOSIS — N186 End stage renal disease: Principal | ICD-10-CM

## 2020-07-18 DIAGNOSIS — K921 Melena: Principal | ICD-10-CM

## 2020-07-18 DIAGNOSIS — J948 Other specified pleural conditions: Principal | ICD-10-CM

## 2020-07-18 DIAGNOSIS — I252 Old myocardial infarction: Principal | ICD-10-CM

## 2020-07-18 DIAGNOSIS — J95821 Acute postprocedural respiratory failure: Principal | ICD-10-CM

## 2020-07-18 DIAGNOSIS — E785 Hyperlipidemia, unspecified: Principal | ICD-10-CM

## 2020-07-18 DIAGNOSIS — M109 Gout, unspecified: Principal | ICD-10-CM

## 2020-07-18 DIAGNOSIS — T8131XA Disruption of external operation (surgical) wound, not elsewhere classified, initial encounter: Principal | ICD-10-CM

## 2020-07-18 DIAGNOSIS — E878 Other disorders of electrolyte and fluid balance, not elsewhere classified: Principal | ICD-10-CM

## 2020-07-18 DIAGNOSIS — D689 Coagulation defect, unspecified: Principal | ICD-10-CM

## 2020-07-18 DIAGNOSIS — D631 Anemia in chronic kidney disease: Principal | ICD-10-CM

## 2020-07-18 DIAGNOSIS — T50905A Adverse effect of unspecified drugs, medicaments and biological substances, initial encounter: Principal | ICD-10-CM

## 2020-07-18 DIAGNOSIS — J90 Pleural effusion, not elsewhere classified: Principal | ICD-10-CM

## 2020-07-18 DIAGNOSIS — J151 Pneumonia due to Pseudomonas: Principal | ICD-10-CM

## 2020-07-18 DIAGNOSIS — Z7982 Long term (current) use of aspirin: Principal | ICD-10-CM

## 2020-07-18 DIAGNOSIS — J156 Pneumonia due to other aerobic Gram-negative bacteria: Principal | ICD-10-CM

## 2020-07-18 DIAGNOSIS — I5022 Chronic systolic (congestive) heart failure: Principal | ICD-10-CM

## 2020-07-18 DIAGNOSIS — D62 Acute posthemorrhagic anemia: Principal | ICD-10-CM

## 2020-07-18 DIAGNOSIS — K2100 Gastro-esophageal reflux disease with esophagitis, without bleeding: Principal | ICD-10-CM

## 2020-07-18 DIAGNOSIS — A4153 Sepsis due to Serratia: Principal | ICD-10-CM

## 2020-07-18 DIAGNOSIS — K9171 Accidental puncture and laceration of a digestive system organ or structure during a digestive system procedure: Principal | ICD-10-CM

## 2020-07-18 DIAGNOSIS — T451X5A Adverse effect of antineoplastic and immunosuppressive drugs, initial encounter: Principal | ICD-10-CM

## 2020-07-18 DIAGNOSIS — B371 Pulmonary candidiasis: Principal | ICD-10-CM

## 2020-07-18 DIAGNOSIS — J95851 Ventilator associated pneumonia: Principal | ICD-10-CM

## 2020-07-18 DIAGNOSIS — K5900 Constipation, unspecified: Principal | ICD-10-CM

## 2020-07-18 DIAGNOSIS — I255 Ischemic cardiomyopathy: Principal | ICD-10-CM

## 2020-07-18 DIAGNOSIS — F05 Delirium due to known physiological condition: Principal | ICD-10-CM

## 2020-07-18 DIAGNOSIS — J851 Abscess of lung with pneumonia: Principal | ICD-10-CM

## 2020-07-18 DIAGNOSIS — K4090 Unilateral inguinal hernia, without obstruction or gangrene, not specified as recurrent: Principal | ICD-10-CM

## 2020-07-18 DIAGNOSIS — M7989 Other specified soft tissue disorders: Principal | ICD-10-CM

## 2020-07-18 DIAGNOSIS — Z85828 Personal history of other malignant neoplasm of skin: Principal | ICD-10-CM

## 2020-07-18 DIAGNOSIS — R570 Cardiogenic shock: Principal | ICD-10-CM

## 2020-07-18 DIAGNOSIS — R188 Other ascites: Principal | ICD-10-CM

## 2020-07-18 DIAGNOSIS — J9809 Other diseases of bronchus, not elsewhere classified: Principal | ICD-10-CM

## 2020-07-18 DIAGNOSIS — I129 Hypertensive chronic kidney disease with stage 1 through stage 4 chronic kidney disease, or unspecified chronic kidney disease: Principal | ICD-10-CM

## 2020-07-18 DIAGNOSIS — R1110 Vomiting, unspecified: Principal | ICD-10-CM

## 2020-07-18 DIAGNOSIS — I9589 Other hypotension: Principal | ICD-10-CM

## 2020-07-18 DIAGNOSIS — I2510 Atherosclerotic heart disease of native coronary artery without angina pectoris: Principal | ICD-10-CM

## 2020-07-18 DIAGNOSIS — F418 Other specified anxiety disorders: Principal | ICD-10-CM

## 2020-07-18 DIAGNOSIS — E1122 Type 2 diabetes mellitus with diabetic chronic kidney disease: Principal | ICD-10-CM

## 2020-07-18 DIAGNOSIS — Z992 Dependence on renal dialysis: Principal | ICD-10-CM

## 2020-07-18 DIAGNOSIS — K449 Diaphragmatic hernia without obstruction or gangrene: Principal | ICD-10-CM

## 2020-07-18 DIAGNOSIS — N170 Acute kidney failure with tubular necrosis: Principal | ICD-10-CM

## 2020-07-18 DIAGNOSIS — D696 Thrombocytopenia, unspecified: Principal | ICD-10-CM

## 2020-07-18 DIAGNOSIS — R6521 Severe sepsis with septic shock: Principal | ICD-10-CM

## 2020-07-18 DIAGNOSIS — E1165 Type 2 diabetes mellitus with hyperglycemia: Principal | ICD-10-CM

## 2020-07-18 DIAGNOSIS — S27309A Unspecified injury of lung, unspecified, initial encounter: Principal | ICD-10-CM

## 2020-07-18 DIAGNOSIS — J9819 Other pulmonary collapse: Principal | ICD-10-CM

## 2020-07-18 DIAGNOSIS — B259 Cytomegaloviral disease, unspecified: Principal | ICD-10-CM

## 2020-07-18 DIAGNOSIS — K9423 Gastrostomy malfunction: Principal | ICD-10-CM

## 2020-07-18 DIAGNOSIS — E875 Hyperkalemia: Principal | ICD-10-CM

## 2020-07-18 DIAGNOSIS — I480 Paroxysmal atrial fibrillation: Principal | ICD-10-CM

## 2020-07-18 DIAGNOSIS — I97418 Intraoperative hemorrhage and hematoma of a circulatory system organ or structure complicating other circulatory system procedure: Principal | ICD-10-CM

## 2020-07-18 DIAGNOSIS — Z95811 Presence of heart assist device: Principal | ICD-10-CM

## 2020-07-18 DIAGNOSIS — K632 Fistula of intestine: Principal | ICD-10-CM

## 2020-07-18 DIAGNOSIS — Z955 Presence of coronary angioplasty implant and graft: Principal | ICD-10-CM

## 2020-07-18 DIAGNOSIS — G253 Myoclonus: Principal | ICD-10-CM

## 2020-07-18 DIAGNOSIS — J986 Disorders of diaphragm: Principal | ICD-10-CM

## 2020-07-18 DIAGNOSIS — R680 Hypothermia, not associated with low environmental temperature: Principal | ICD-10-CM

## 2020-07-18 DIAGNOSIS — R17 Unspecified jaundice: Principal | ICD-10-CM

## 2020-07-18 DIAGNOSIS — K650 Generalized (acute) peritonitis: Principal | ICD-10-CM

## 2020-07-18 DIAGNOSIS — E872 Acidosis: Principal | ICD-10-CM

## 2020-07-18 DIAGNOSIS — J982 Interstitial emphysema: Principal | ICD-10-CM

## 2020-07-18 DIAGNOSIS — E8770 Fluid overload, unspecified: Principal | ICD-10-CM

## 2020-07-18 DIAGNOSIS — D849 Immunodeficiency, unspecified: Principal | ICD-10-CM

## 2020-07-18 DIAGNOSIS — I5084 End stage heart failure: Principal | ICD-10-CM

## 2020-07-18 DIAGNOSIS — Z7901 Long term (current) use of anticoagulants: Principal | ICD-10-CM

## 2020-07-23 DIAGNOSIS — M109 Gout, unspecified: Principal | ICD-10-CM

## 2020-07-23 DIAGNOSIS — I5084 End stage heart failure: Principal | ICD-10-CM

## 2020-07-23 DIAGNOSIS — G253 Myoclonus: Principal | ICD-10-CM

## 2020-07-23 DIAGNOSIS — N186 End stage renal disease: Principal | ICD-10-CM

## 2020-07-23 DIAGNOSIS — J156 Pneumonia due to other aerobic Gram-negative bacteria: Principal | ICD-10-CM

## 2020-07-23 DIAGNOSIS — B371 Pulmonary candidiasis: Principal | ICD-10-CM

## 2020-07-23 DIAGNOSIS — T8131XA Disruption of external operation (surgical) wound, not elsewhere classified, initial encounter: Principal | ICD-10-CM

## 2020-07-23 DIAGNOSIS — D849 Immunodeficiency, unspecified: Principal | ICD-10-CM

## 2020-07-23 DIAGNOSIS — K449 Diaphragmatic hernia without obstruction or gangrene: Principal | ICD-10-CM

## 2020-07-23 DIAGNOSIS — Z95811 Presence of heart assist device: Principal | ICD-10-CM

## 2020-07-23 DIAGNOSIS — D696 Thrombocytopenia, unspecified: Principal | ICD-10-CM

## 2020-07-23 DIAGNOSIS — J9819 Other pulmonary collapse: Principal | ICD-10-CM

## 2020-07-23 DIAGNOSIS — E872 Acidosis: Principal | ICD-10-CM

## 2020-07-23 DIAGNOSIS — Z85828 Personal history of other malignant neoplasm of skin: Principal | ICD-10-CM

## 2020-07-23 DIAGNOSIS — R570 Cardiogenic shock: Principal | ICD-10-CM

## 2020-07-23 DIAGNOSIS — G92 Toxic encephalopathy: Principal | ICD-10-CM

## 2020-07-23 DIAGNOSIS — K5900 Constipation, unspecified: Principal | ICD-10-CM

## 2020-07-23 DIAGNOSIS — J982 Interstitial emphysema: Principal | ICD-10-CM

## 2020-07-23 DIAGNOSIS — J851 Abscess of lung with pneumonia: Principal | ICD-10-CM

## 2020-07-23 DIAGNOSIS — I129 Hypertensive chronic kidney disease with stage 1 through stage 4 chronic kidney disease, or unspecified chronic kidney disease: Principal | ICD-10-CM

## 2020-07-23 DIAGNOSIS — J986 Disorders of diaphragm: Principal | ICD-10-CM

## 2020-07-23 DIAGNOSIS — E785 Hyperlipidemia, unspecified: Principal | ICD-10-CM

## 2020-07-23 DIAGNOSIS — Z7901 Long term (current) use of anticoagulants: Principal | ICD-10-CM

## 2020-07-23 DIAGNOSIS — N170 Acute kidney failure with tubular necrosis: Principal | ICD-10-CM

## 2020-07-23 DIAGNOSIS — I480 Paroxysmal atrial fibrillation: Principal | ICD-10-CM

## 2020-07-23 DIAGNOSIS — K632 Fistula of intestine: Principal | ICD-10-CM

## 2020-07-23 DIAGNOSIS — R680 Hypothermia, not associated with low environmental temperature: Principal | ICD-10-CM

## 2020-07-23 DIAGNOSIS — R188 Other ascites: Principal | ICD-10-CM

## 2020-07-23 DIAGNOSIS — I252 Old myocardial infarction: Principal | ICD-10-CM

## 2020-07-23 DIAGNOSIS — F05 Delirium due to known physiological condition: Principal | ICD-10-CM

## 2020-07-23 DIAGNOSIS — J9809 Other diseases of bronchus, not elsewhere classified: Principal | ICD-10-CM

## 2020-07-23 DIAGNOSIS — I255 Ischemic cardiomyopathy: Principal | ICD-10-CM

## 2020-07-23 DIAGNOSIS — J151 Pneumonia due to Pseudomonas: Principal | ICD-10-CM

## 2020-07-23 DIAGNOSIS — E1165 Type 2 diabetes mellitus with hyperglycemia: Principal | ICD-10-CM

## 2020-07-23 DIAGNOSIS — R17 Unspecified jaundice: Principal | ICD-10-CM

## 2020-07-23 DIAGNOSIS — J95821 Acute postprocedural respiratory failure: Principal | ICD-10-CM

## 2020-07-23 DIAGNOSIS — K9423 Gastrostomy malfunction: Principal | ICD-10-CM

## 2020-07-23 DIAGNOSIS — B259 Cytomegaloviral disease, unspecified: Principal | ICD-10-CM

## 2020-07-23 DIAGNOSIS — Z955 Presence of coronary angioplasty implant and graft: Principal | ICD-10-CM

## 2020-07-23 DIAGNOSIS — T50905A Adverse effect of unspecified drugs, medicaments and biological substances, initial encounter: Principal | ICD-10-CM

## 2020-07-23 DIAGNOSIS — R1110 Vomiting, unspecified: Principal | ICD-10-CM

## 2020-07-23 DIAGNOSIS — F418 Other specified anxiety disorders: Principal | ICD-10-CM

## 2020-07-23 DIAGNOSIS — K921 Melena: Principal | ICD-10-CM

## 2020-07-23 DIAGNOSIS — E878 Other disorders of electrolyte and fluid balance, not elsewhere classified: Principal | ICD-10-CM

## 2020-07-23 DIAGNOSIS — K9171 Accidental puncture and laceration of a digestive system organ or structure during a digestive system procedure: Principal | ICD-10-CM

## 2020-07-23 DIAGNOSIS — K2100 Gastro-esophageal reflux disease with esophagitis, without bleeding: Principal | ICD-10-CM

## 2020-07-23 DIAGNOSIS — K4090 Unilateral inguinal hernia, without obstruction or gangrene, not specified as recurrent: Principal | ICD-10-CM

## 2020-07-23 DIAGNOSIS — D62 Acute posthemorrhagic anemia: Principal | ICD-10-CM

## 2020-07-23 DIAGNOSIS — I97418 Intraoperative hemorrhage and hematoma of a circulatory system organ or structure complicating other circulatory system procedure: Principal | ICD-10-CM

## 2020-07-23 DIAGNOSIS — T451X5A Adverse effect of antineoplastic and immunosuppressive drugs, initial encounter: Principal | ICD-10-CM

## 2020-07-23 DIAGNOSIS — E8770 Fluid overload, unspecified: Principal | ICD-10-CM

## 2020-07-23 DIAGNOSIS — R6521 Severe sepsis with septic shock: Principal | ICD-10-CM

## 2020-07-23 DIAGNOSIS — D631 Anemia in chronic kidney disease: Principal | ICD-10-CM

## 2020-07-23 DIAGNOSIS — Z992 Dependence on renal dialysis: Principal | ICD-10-CM

## 2020-07-23 DIAGNOSIS — K650 Generalized (acute) peritonitis: Principal | ICD-10-CM

## 2020-07-23 DIAGNOSIS — I9589 Other hypotension: Principal | ICD-10-CM

## 2020-07-23 DIAGNOSIS — I5022 Chronic systolic (congestive) heart failure: Principal | ICD-10-CM

## 2020-07-23 DIAGNOSIS — E1122 Type 2 diabetes mellitus with diabetic chronic kidney disease: Principal | ICD-10-CM

## 2020-07-23 DIAGNOSIS — J90 Pleural effusion, not elsewhere classified: Principal | ICD-10-CM

## 2020-07-23 DIAGNOSIS — S27309A Unspecified injury of lung, unspecified, initial encounter: Principal | ICD-10-CM

## 2020-07-23 DIAGNOSIS — M7989 Other specified soft tissue disorders: Principal | ICD-10-CM

## 2020-07-23 DIAGNOSIS — J95851 Ventilator associated pneumonia: Principal | ICD-10-CM

## 2020-07-23 DIAGNOSIS — I2510 Atherosclerotic heart disease of native coronary artery without angina pectoris: Principal | ICD-10-CM

## 2020-07-23 DIAGNOSIS — Z7982 Long term (current) use of aspirin: Principal | ICD-10-CM

## 2020-07-23 DIAGNOSIS — E875 Hyperkalemia: Principal | ICD-10-CM

## 2020-07-23 DIAGNOSIS — J948 Other specified pleural conditions: Principal | ICD-10-CM

## 2020-07-23 DIAGNOSIS — A4153 Sepsis due to Serratia: Principal | ICD-10-CM

## 2020-07-23 DIAGNOSIS — D689 Coagulation defect, unspecified: Principal | ICD-10-CM

## 2020-07-25 DIAGNOSIS — I2510 Atherosclerotic heart disease of native coronary artery without angina pectoris: Principal | ICD-10-CM

## 2020-07-25 DIAGNOSIS — E8770 Fluid overload, unspecified: Principal | ICD-10-CM

## 2020-07-25 DIAGNOSIS — D689 Coagulation defect, unspecified: Principal | ICD-10-CM

## 2020-07-25 DIAGNOSIS — I5022 Chronic systolic (congestive) heart failure: Principal | ICD-10-CM

## 2020-07-25 DIAGNOSIS — I5084 End stage heart failure: Principal | ICD-10-CM

## 2020-07-25 DIAGNOSIS — R1110 Vomiting, unspecified: Principal | ICD-10-CM

## 2020-07-25 DIAGNOSIS — J156 Pneumonia due to other aerobic Gram-negative bacteria: Principal | ICD-10-CM

## 2020-07-25 DIAGNOSIS — F418 Other specified anxiety disorders: Principal | ICD-10-CM

## 2020-07-25 DIAGNOSIS — J851 Abscess of lung with pneumonia: Principal | ICD-10-CM

## 2020-07-25 DIAGNOSIS — Z7901 Long term (current) use of anticoagulants: Principal | ICD-10-CM

## 2020-07-25 DIAGNOSIS — J9809 Other diseases of bronchus, not elsewhere classified: Principal | ICD-10-CM

## 2020-07-25 DIAGNOSIS — J948 Other specified pleural conditions: Principal | ICD-10-CM

## 2020-07-25 DIAGNOSIS — B371 Pulmonary candidiasis: Principal | ICD-10-CM

## 2020-07-25 DIAGNOSIS — E1165 Type 2 diabetes mellitus with hyperglycemia: Principal | ICD-10-CM

## 2020-07-25 DIAGNOSIS — D696 Thrombocytopenia, unspecified: Principal | ICD-10-CM

## 2020-07-25 DIAGNOSIS — B259 Cytomegaloviral disease, unspecified: Principal | ICD-10-CM

## 2020-07-25 DIAGNOSIS — E1122 Type 2 diabetes mellitus with diabetic chronic kidney disease: Principal | ICD-10-CM

## 2020-07-25 DIAGNOSIS — M7989 Other specified soft tissue disorders: Principal | ICD-10-CM

## 2020-07-25 DIAGNOSIS — K2100 Gastro-esophageal reflux disease with esophagitis, without bleeding: Principal | ICD-10-CM

## 2020-07-25 DIAGNOSIS — G92 Toxic encephalopathy: Principal | ICD-10-CM

## 2020-07-25 DIAGNOSIS — J986 Disorders of diaphragm: Principal | ICD-10-CM

## 2020-07-25 DIAGNOSIS — J90 Pleural effusion, not elsewhere classified: Principal | ICD-10-CM

## 2020-07-25 DIAGNOSIS — K5900 Constipation, unspecified: Principal | ICD-10-CM

## 2020-07-25 DIAGNOSIS — Z7982 Long term (current) use of aspirin: Principal | ICD-10-CM

## 2020-07-25 DIAGNOSIS — E878 Other disorders of electrolyte and fluid balance, not elsewhere classified: Principal | ICD-10-CM

## 2020-07-25 DIAGNOSIS — R17 Unspecified jaundice: Principal | ICD-10-CM

## 2020-07-25 DIAGNOSIS — I129 Hypertensive chronic kidney disease with stage 1 through stage 4 chronic kidney disease, or unspecified chronic kidney disease: Principal | ICD-10-CM

## 2020-07-25 DIAGNOSIS — R680 Hypothermia, not associated with low environmental temperature: Principal | ICD-10-CM

## 2020-07-25 DIAGNOSIS — E785 Hyperlipidemia, unspecified: Principal | ICD-10-CM

## 2020-07-25 DIAGNOSIS — I255 Ischemic cardiomyopathy: Principal | ICD-10-CM

## 2020-07-25 DIAGNOSIS — I97418 Intraoperative hemorrhage and hematoma of a circulatory system organ or structure complicating other circulatory system procedure: Principal | ICD-10-CM

## 2020-07-25 DIAGNOSIS — R6521 Severe sepsis with septic shock: Principal | ICD-10-CM

## 2020-07-25 DIAGNOSIS — K921 Melena: Principal | ICD-10-CM

## 2020-07-25 DIAGNOSIS — M109 Gout, unspecified: Principal | ICD-10-CM

## 2020-07-25 DIAGNOSIS — K449 Diaphragmatic hernia without obstruction or gangrene: Principal | ICD-10-CM

## 2020-07-25 DIAGNOSIS — E872 Acidosis: Principal | ICD-10-CM

## 2020-07-25 DIAGNOSIS — J151 Pneumonia due to Pseudomonas: Principal | ICD-10-CM

## 2020-07-25 DIAGNOSIS — J95851 Ventilator associated pneumonia: Principal | ICD-10-CM

## 2020-07-25 DIAGNOSIS — T50905A Adverse effect of unspecified drugs, medicaments and biological substances, initial encounter: Principal | ICD-10-CM

## 2020-07-25 DIAGNOSIS — K650 Generalized (acute) peritonitis: Principal | ICD-10-CM

## 2020-07-25 DIAGNOSIS — Z955 Presence of coronary angioplasty implant and graft: Principal | ICD-10-CM

## 2020-07-25 DIAGNOSIS — D849 Immunodeficiency, unspecified: Principal | ICD-10-CM

## 2020-07-25 DIAGNOSIS — I480 Paroxysmal atrial fibrillation: Principal | ICD-10-CM

## 2020-07-25 DIAGNOSIS — I9589 Other hypotension: Principal | ICD-10-CM

## 2020-07-25 DIAGNOSIS — K9171 Accidental puncture and laceration of a digestive system organ or structure during a digestive system procedure: Principal | ICD-10-CM

## 2020-07-25 DIAGNOSIS — K9423 Gastrostomy malfunction: Principal | ICD-10-CM

## 2020-07-25 DIAGNOSIS — D631 Anemia in chronic kidney disease: Principal | ICD-10-CM

## 2020-07-25 DIAGNOSIS — J95821 Acute postprocedural respiratory failure: Principal | ICD-10-CM

## 2020-07-25 DIAGNOSIS — R188 Other ascites: Principal | ICD-10-CM

## 2020-07-25 DIAGNOSIS — J9819 Other pulmonary collapse: Principal | ICD-10-CM

## 2020-07-25 DIAGNOSIS — J982 Interstitial emphysema: Principal | ICD-10-CM

## 2020-07-25 DIAGNOSIS — D62 Acute posthemorrhagic anemia: Principal | ICD-10-CM

## 2020-07-25 DIAGNOSIS — G253 Myoclonus: Principal | ICD-10-CM

## 2020-07-25 DIAGNOSIS — A4153 Sepsis due to Serratia: Principal | ICD-10-CM

## 2020-07-25 DIAGNOSIS — T451X5A Adverse effect of antineoplastic and immunosuppressive drugs, initial encounter: Principal | ICD-10-CM

## 2020-07-25 DIAGNOSIS — K632 Fistula of intestine: Principal | ICD-10-CM

## 2020-07-25 DIAGNOSIS — F05 Delirium due to known physiological condition: Principal | ICD-10-CM

## 2020-07-25 DIAGNOSIS — Z95811 Presence of heart assist device: Principal | ICD-10-CM

## 2020-07-25 DIAGNOSIS — N170 Acute kidney failure with tubular necrosis: Principal | ICD-10-CM

## 2020-07-25 DIAGNOSIS — N186 End stage renal disease: Principal | ICD-10-CM

## 2020-07-25 DIAGNOSIS — K4090 Unilateral inguinal hernia, without obstruction or gangrene, not specified as recurrent: Principal | ICD-10-CM

## 2020-07-25 DIAGNOSIS — Z85828 Personal history of other malignant neoplasm of skin: Principal | ICD-10-CM

## 2020-07-25 DIAGNOSIS — Z992 Dependence on renal dialysis: Principal | ICD-10-CM

## 2020-07-25 DIAGNOSIS — R570 Cardiogenic shock: Principal | ICD-10-CM

## 2020-07-25 DIAGNOSIS — S27309A Unspecified injury of lung, unspecified, initial encounter: Principal | ICD-10-CM

## 2020-07-25 DIAGNOSIS — T8131XA Disruption of external operation (surgical) wound, not elsewhere classified, initial encounter: Principal | ICD-10-CM

## 2020-07-25 DIAGNOSIS — E875 Hyperkalemia: Principal | ICD-10-CM

## 2020-07-25 DIAGNOSIS — I252 Old myocardial infarction: Principal | ICD-10-CM

## 2020-07-30 DIAGNOSIS — I97418 Intraoperative hemorrhage and hematoma of a circulatory system organ or structure complicating other circulatory system procedure: Principal | ICD-10-CM

## 2020-07-30 DIAGNOSIS — K650 Generalized (acute) peritonitis: Principal | ICD-10-CM

## 2020-07-30 DIAGNOSIS — E872 Acidosis: Principal | ICD-10-CM

## 2020-07-30 DIAGNOSIS — I129 Hypertensive chronic kidney disease with stage 1 through stage 4 chronic kidney disease, or unspecified chronic kidney disease: Principal | ICD-10-CM

## 2020-07-30 DIAGNOSIS — J156 Pneumonia due to other aerobic Gram-negative bacteria: Principal | ICD-10-CM

## 2020-07-30 DIAGNOSIS — D849 Immunodeficiency, unspecified: Principal | ICD-10-CM

## 2020-07-30 DIAGNOSIS — I255 Ischemic cardiomyopathy: Principal | ICD-10-CM

## 2020-07-30 DIAGNOSIS — T50905A Adverse effect of unspecified drugs, medicaments and biological substances, initial encounter: Principal | ICD-10-CM

## 2020-07-30 DIAGNOSIS — R6521 Severe sepsis with septic shock: Principal | ICD-10-CM

## 2020-07-30 DIAGNOSIS — I480 Paroxysmal atrial fibrillation: Principal | ICD-10-CM

## 2020-07-30 DIAGNOSIS — K449 Diaphragmatic hernia without obstruction or gangrene: Principal | ICD-10-CM

## 2020-07-30 DIAGNOSIS — R17 Unspecified jaundice: Principal | ICD-10-CM

## 2020-07-30 DIAGNOSIS — Z95811 Presence of heart assist device: Principal | ICD-10-CM

## 2020-07-30 DIAGNOSIS — K921 Melena: Principal | ICD-10-CM

## 2020-07-30 DIAGNOSIS — K5900 Constipation, unspecified: Principal | ICD-10-CM

## 2020-07-30 DIAGNOSIS — J95851 Ventilator associated pneumonia: Principal | ICD-10-CM

## 2020-07-30 DIAGNOSIS — B259 Cytomegaloviral disease, unspecified: Principal | ICD-10-CM

## 2020-07-30 DIAGNOSIS — M109 Gout, unspecified: Principal | ICD-10-CM

## 2020-07-30 DIAGNOSIS — D631 Anemia in chronic kidney disease: Principal | ICD-10-CM

## 2020-07-30 DIAGNOSIS — K2100 Gastro-esophageal reflux disease with esophagitis, without bleeding: Principal | ICD-10-CM

## 2020-07-30 DIAGNOSIS — F05 Delirium due to known physiological condition: Principal | ICD-10-CM

## 2020-07-30 DIAGNOSIS — K9423 Gastrostomy malfunction: Principal | ICD-10-CM

## 2020-07-30 DIAGNOSIS — G253 Myoclonus: Principal | ICD-10-CM

## 2020-07-30 DIAGNOSIS — I5022 Chronic systolic (congestive) heart failure: Principal | ICD-10-CM

## 2020-07-30 DIAGNOSIS — R570 Cardiogenic shock: Principal | ICD-10-CM

## 2020-07-30 DIAGNOSIS — E1165 Type 2 diabetes mellitus with hyperglycemia: Principal | ICD-10-CM

## 2020-07-30 DIAGNOSIS — E785 Hyperlipidemia, unspecified: Principal | ICD-10-CM

## 2020-07-30 DIAGNOSIS — E8770 Fluid overload, unspecified: Principal | ICD-10-CM

## 2020-07-30 DIAGNOSIS — Z7982 Long term (current) use of aspirin: Principal | ICD-10-CM

## 2020-07-30 DIAGNOSIS — T8131XA Disruption of external operation (surgical) wound, not elsewhere classified, initial encounter: Principal | ICD-10-CM

## 2020-07-30 DIAGNOSIS — D62 Acute posthemorrhagic anemia: Principal | ICD-10-CM

## 2020-07-30 DIAGNOSIS — J986 Disorders of diaphragm: Principal | ICD-10-CM

## 2020-07-30 DIAGNOSIS — N170 Acute kidney failure with tubular necrosis: Principal | ICD-10-CM

## 2020-07-30 DIAGNOSIS — J95821 Acute postprocedural respiratory failure: Principal | ICD-10-CM

## 2020-07-30 DIAGNOSIS — S27309A Unspecified injury of lung, unspecified, initial encounter: Principal | ICD-10-CM

## 2020-07-30 DIAGNOSIS — T451X5A Adverse effect of antineoplastic and immunosuppressive drugs, initial encounter: Principal | ICD-10-CM

## 2020-07-30 DIAGNOSIS — E1122 Type 2 diabetes mellitus with diabetic chronic kidney disease: Principal | ICD-10-CM

## 2020-07-30 DIAGNOSIS — I2510 Atherosclerotic heart disease of native coronary artery without angina pectoris: Principal | ICD-10-CM

## 2020-07-30 DIAGNOSIS — I9589 Other hypotension: Principal | ICD-10-CM

## 2020-07-30 DIAGNOSIS — Z85828 Personal history of other malignant neoplasm of skin: Principal | ICD-10-CM

## 2020-07-30 DIAGNOSIS — F418 Other specified anxiety disorders: Principal | ICD-10-CM

## 2020-07-30 DIAGNOSIS — I252 Old myocardial infarction: Principal | ICD-10-CM

## 2020-07-30 DIAGNOSIS — J948 Other specified pleural conditions: Principal | ICD-10-CM

## 2020-07-30 DIAGNOSIS — A4153 Sepsis due to Serratia: Principal | ICD-10-CM

## 2020-07-30 DIAGNOSIS — J851 Abscess of lung with pneumonia: Principal | ICD-10-CM

## 2020-07-30 DIAGNOSIS — R680 Hypothermia, not associated with low environmental temperature: Principal | ICD-10-CM

## 2020-07-30 DIAGNOSIS — M7989 Other specified soft tissue disorders: Principal | ICD-10-CM

## 2020-07-30 DIAGNOSIS — R1110 Vomiting, unspecified: Principal | ICD-10-CM

## 2020-07-30 DIAGNOSIS — Z992 Dependence on renal dialysis: Principal | ICD-10-CM

## 2020-07-30 DIAGNOSIS — Z955 Presence of coronary angioplasty implant and graft: Principal | ICD-10-CM

## 2020-07-30 DIAGNOSIS — J9819 Other pulmonary collapse: Principal | ICD-10-CM

## 2020-07-30 DIAGNOSIS — J9809 Other diseases of bronchus, not elsewhere classified: Principal | ICD-10-CM

## 2020-07-30 DIAGNOSIS — E878 Other disorders of electrolyte and fluid balance, not elsewhere classified: Principal | ICD-10-CM

## 2020-07-30 DIAGNOSIS — G92 Toxic encephalopathy: Principal | ICD-10-CM

## 2020-07-30 DIAGNOSIS — E875 Hyperkalemia: Principal | ICD-10-CM

## 2020-07-30 DIAGNOSIS — J151 Pneumonia due to Pseudomonas: Principal | ICD-10-CM

## 2020-07-30 DIAGNOSIS — R188 Other ascites: Principal | ICD-10-CM

## 2020-07-30 DIAGNOSIS — B371 Pulmonary candidiasis: Principal | ICD-10-CM

## 2020-07-30 DIAGNOSIS — J90 Pleural effusion, not elsewhere classified: Principal | ICD-10-CM

## 2020-07-30 DIAGNOSIS — I5084 End stage heart failure: Principal | ICD-10-CM

## 2020-07-30 DIAGNOSIS — K4090 Unilateral inguinal hernia, without obstruction or gangrene, not specified as recurrent: Principal | ICD-10-CM

## 2020-07-30 DIAGNOSIS — D689 Coagulation defect, unspecified: Principal | ICD-10-CM

## 2020-07-30 DIAGNOSIS — Z7901 Long term (current) use of anticoagulants: Principal | ICD-10-CM

## 2020-07-30 DIAGNOSIS — K9171 Accidental puncture and laceration of a digestive system organ or structure during a digestive system procedure: Principal | ICD-10-CM

## 2020-07-30 DIAGNOSIS — D696 Thrombocytopenia, unspecified: Principal | ICD-10-CM

## 2020-07-30 DIAGNOSIS — J982 Interstitial emphysema: Principal | ICD-10-CM

## 2020-07-30 DIAGNOSIS — N186 End stage renal disease: Principal | ICD-10-CM

## 2020-07-30 DIAGNOSIS — K632 Fistula of intestine: Principal | ICD-10-CM

## 2020-08-01 DIAGNOSIS — S27309A Unspecified injury of lung, unspecified, initial encounter: Principal | ICD-10-CM

## 2020-08-01 DIAGNOSIS — K650 Generalized (acute) peritonitis: Principal | ICD-10-CM

## 2020-08-01 DIAGNOSIS — I129 Hypertensive chronic kidney disease with stage 1 through stage 4 chronic kidney disease, or unspecified chronic kidney disease: Principal | ICD-10-CM

## 2020-08-01 DIAGNOSIS — R1110 Vomiting, unspecified: Principal | ICD-10-CM

## 2020-08-01 DIAGNOSIS — E8770 Fluid overload, unspecified: Principal | ICD-10-CM

## 2020-08-01 DIAGNOSIS — K921 Melena: Principal | ICD-10-CM

## 2020-08-01 DIAGNOSIS — Z7982 Long term (current) use of aspirin: Principal | ICD-10-CM

## 2020-08-01 DIAGNOSIS — K9423 Gastrostomy malfunction: Principal | ICD-10-CM

## 2020-08-01 DIAGNOSIS — Z85828 Personal history of other malignant neoplasm of skin: Principal | ICD-10-CM

## 2020-08-01 DIAGNOSIS — K449 Diaphragmatic hernia without obstruction or gangrene: Principal | ICD-10-CM

## 2020-08-01 DIAGNOSIS — F418 Other specified anxiety disorders: Principal | ICD-10-CM

## 2020-08-01 DIAGNOSIS — K5900 Constipation, unspecified: Principal | ICD-10-CM

## 2020-08-01 DIAGNOSIS — N186 End stage renal disease: Principal | ICD-10-CM

## 2020-08-01 DIAGNOSIS — J151 Pneumonia due to Pseudomonas: Principal | ICD-10-CM

## 2020-08-01 DIAGNOSIS — D689 Coagulation defect, unspecified: Principal | ICD-10-CM

## 2020-08-01 DIAGNOSIS — I252 Old myocardial infarction: Principal | ICD-10-CM

## 2020-08-01 DIAGNOSIS — R570 Cardiogenic shock: Principal | ICD-10-CM

## 2020-08-01 DIAGNOSIS — I5084 End stage heart failure: Principal | ICD-10-CM

## 2020-08-01 DIAGNOSIS — D696 Thrombocytopenia, unspecified: Principal | ICD-10-CM

## 2020-08-01 DIAGNOSIS — Z992 Dependence on renal dialysis: Principal | ICD-10-CM

## 2020-08-01 DIAGNOSIS — Z95811 Presence of heart assist device: Principal | ICD-10-CM

## 2020-08-01 DIAGNOSIS — I2510 Atherosclerotic heart disease of native coronary artery without angina pectoris: Principal | ICD-10-CM

## 2020-08-01 DIAGNOSIS — J986 Disorders of diaphragm: Principal | ICD-10-CM

## 2020-08-01 DIAGNOSIS — I480 Paroxysmal atrial fibrillation: Principal | ICD-10-CM

## 2020-08-01 DIAGNOSIS — K2100 Gastro-esophageal reflux disease with esophagitis, without bleeding: Principal | ICD-10-CM

## 2020-08-01 DIAGNOSIS — D849 Immunodeficiency, unspecified: Principal | ICD-10-CM

## 2020-08-01 DIAGNOSIS — J948 Other specified pleural conditions: Principal | ICD-10-CM

## 2020-08-01 DIAGNOSIS — K9171 Accidental puncture and laceration of a digestive system organ or structure during a digestive system procedure: Principal | ICD-10-CM

## 2020-08-01 DIAGNOSIS — M109 Gout, unspecified: Principal | ICD-10-CM

## 2020-08-01 DIAGNOSIS — E878 Other disorders of electrolyte and fluid balance, not elsewhere classified: Principal | ICD-10-CM

## 2020-08-01 DIAGNOSIS — B371 Pulmonary candidiasis: Principal | ICD-10-CM

## 2020-08-01 DIAGNOSIS — T451X5A Adverse effect of antineoplastic and immunosuppressive drugs, initial encounter: Principal | ICD-10-CM

## 2020-08-01 DIAGNOSIS — R680 Hypothermia, not associated with low environmental temperature: Principal | ICD-10-CM

## 2020-08-01 DIAGNOSIS — B259 Cytomegaloviral disease, unspecified: Principal | ICD-10-CM

## 2020-08-01 DIAGNOSIS — J982 Interstitial emphysema: Principal | ICD-10-CM

## 2020-08-01 DIAGNOSIS — J9809 Other diseases of bronchus, not elsewhere classified: Principal | ICD-10-CM

## 2020-08-01 DIAGNOSIS — J851 Abscess of lung with pneumonia: Principal | ICD-10-CM

## 2020-08-01 DIAGNOSIS — E785 Hyperlipidemia, unspecified: Principal | ICD-10-CM

## 2020-08-01 DIAGNOSIS — R6521 Severe sepsis with septic shock: Principal | ICD-10-CM

## 2020-08-01 DIAGNOSIS — G92 Toxic encephalopathy: Principal | ICD-10-CM

## 2020-08-01 DIAGNOSIS — K632 Fistula of intestine: Principal | ICD-10-CM

## 2020-08-01 DIAGNOSIS — K4090 Unilateral inguinal hernia, without obstruction or gangrene, not specified as recurrent: Principal | ICD-10-CM

## 2020-08-01 DIAGNOSIS — J95851 Ventilator associated pneumonia: Principal | ICD-10-CM

## 2020-08-01 DIAGNOSIS — I5022 Chronic systolic (congestive) heart failure: Principal | ICD-10-CM

## 2020-08-01 DIAGNOSIS — D62 Acute posthemorrhagic anemia: Principal | ICD-10-CM

## 2020-08-01 DIAGNOSIS — E1165 Type 2 diabetes mellitus with hyperglycemia: Principal | ICD-10-CM

## 2020-08-01 DIAGNOSIS — N170 Acute kidney failure with tubular necrosis: Principal | ICD-10-CM

## 2020-08-01 DIAGNOSIS — E872 Acidosis: Principal | ICD-10-CM

## 2020-08-01 DIAGNOSIS — R188 Other ascites: Principal | ICD-10-CM

## 2020-08-01 DIAGNOSIS — Z955 Presence of coronary angioplasty implant and graft: Principal | ICD-10-CM

## 2020-08-01 DIAGNOSIS — A4153 Sepsis due to Serratia: Principal | ICD-10-CM

## 2020-08-01 DIAGNOSIS — J90 Pleural effusion, not elsewhere classified: Principal | ICD-10-CM

## 2020-08-01 DIAGNOSIS — E875 Hyperkalemia: Principal | ICD-10-CM

## 2020-08-01 DIAGNOSIS — Z7901 Long term (current) use of anticoagulants: Principal | ICD-10-CM

## 2020-08-01 DIAGNOSIS — F05 Delirium due to known physiological condition: Principal | ICD-10-CM

## 2020-08-01 DIAGNOSIS — M7989 Other specified soft tissue disorders: Principal | ICD-10-CM

## 2020-08-01 DIAGNOSIS — I97418 Intraoperative hemorrhage and hematoma of a circulatory system organ or structure complicating other circulatory system procedure: Principal | ICD-10-CM

## 2020-08-01 DIAGNOSIS — D631 Anemia in chronic kidney disease: Principal | ICD-10-CM

## 2020-08-01 DIAGNOSIS — J9819 Other pulmonary collapse: Principal | ICD-10-CM

## 2020-08-01 DIAGNOSIS — I9589 Other hypotension: Principal | ICD-10-CM

## 2020-08-01 DIAGNOSIS — G253 Myoclonus: Principal | ICD-10-CM

## 2020-08-01 DIAGNOSIS — T50905A Adverse effect of unspecified drugs, medicaments and biological substances, initial encounter: Principal | ICD-10-CM

## 2020-08-01 DIAGNOSIS — J95821 Acute postprocedural respiratory failure: Principal | ICD-10-CM

## 2020-08-01 DIAGNOSIS — E1122 Type 2 diabetes mellitus with diabetic chronic kidney disease: Principal | ICD-10-CM

## 2020-08-01 DIAGNOSIS — J156 Pneumonia due to other aerobic Gram-negative bacteria: Principal | ICD-10-CM

## 2020-08-01 DIAGNOSIS — R17 Unspecified jaundice: Principal | ICD-10-CM

## 2020-08-01 DIAGNOSIS — I255 Ischemic cardiomyopathy: Principal | ICD-10-CM

## 2020-08-01 DIAGNOSIS — T8131XA Disruption of external operation (surgical) wound, not elsewhere classified, initial encounter: Principal | ICD-10-CM

## 2020-08-04 MED ORDER — GENTAMICIN 1 MG/ML, SODIUM CITRATE 4% (2.4 ML) INTRA-CATHETER SYRINGE: 2 mL | mL | 0 refills | 28 days | Status: SS

## 2020-08-04 MED ORDER — EPOETIN ALFA-EPBX 10,000 UNIT/ML INJECTION SOLUTION
SUBCUTANEOUS | 2 refills | 2 days | Status: SS
Start: 2020-08-04 — End: ?

## 2020-08-04 MED ORDER — MAGNESIUM OXIDE 400 MG (241.3 MG MAGNESIUM) TABLET
ORAL_TABLET | Freq: Two times a day (BID) | ORAL | 11 refills | 30 days | Status: SS
Start: 2020-08-04 — End: ?

## 2020-08-04 MED ORDER — ATORVASTATIN 10 MG TABLET
ORAL_TABLET | Freq: Every day | ORAL | 11 refills | 30 days | Status: SS
Start: 2020-08-04 — End: ?

## 2020-08-04 MED ORDER — VALGANCICLOVIR 450 MG TABLET
ORAL_TABLET | ORAL | 3 refills | 87.00000 days | Status: SS
Start: 2020-08-04 — End: ?

## 2020-08-04 MED ORDER — TACROLIMUS 1 MG CAPSULE, IMMEDIATE-RELEASE
ORAL_CAPSULE | Freq: Two times a day (BID) | ORAL | 11 refills | 30.00000 days | Status: SS
Start: 2020-08-04 — End: 2020-08-04

## 2020-08-04 MED ORDER — CALCIUM CARBONATE 600 MG CALCIUM (1,500 MG) TABLET
ORAL_TABLET | Freq: Two times a day (BID) | ORAL | 6 refills | 30.00000 days | Status: SS
Start: 2020-08-04 — End: ?

## 2020-08-04 MED ORDER — POLYETHYLENE GLYCOL 3350 17 GRAM ORAL POWDER PACKET
PACK | Freq: Two times a day (BID) | ORAL | 0 refills | 30.00000 days | Status: SS
Start: 2020-08-04 — End: ?

## 2020-08-04 MED ORDER — TRAZODONE 50 MG TABLET
ORAL_TABLET | Freq: Every evening | ORAL | 2 refills | 60.00000 days | Status: SS | PRN
Start: 2020-08-04 — End: ?

## 2020-08-04 MED ORDER — DICLOFENAC 1 % TOPICAL GEL
Freq: Three times a day (TID) | TOPICAL | 0 refills | 17.00000 days | Status: SS | PRN
Start: 2020-08-04 — End: ?

## 2020-08-04 MED ORDER — MYCOPHENOLATE MOFETIL 250 MG CAPSULE
ORAL_CAPSULE | Freq: Two times a day (BID) | ORAL | 2 refills | 30.00000 days | Status: SS
Start: 2020-08-04 — End: ?

## 2020-08-04 MED ORDER — GENTAMICIN 1 MG/ML, SODIUM CITRATE 4% (2.4 ML) INTRA-CATHETER SYRINGE
0 refills | 28.00000 days | Status: SS
Start: 2020-08-04 — End: 2020-09-03

## 2020-08-04 MED ORDER — MIRTAZAPINE 15 MG TABLET
ORAL_TABLET | Freq: Every evening | ORAL | 2 refills | 30.00000 days | Status: SS
Start: 2020-08-04 — End: ?

## 2020-08-04 MED ORDER — PANTOPRAZOLE 40 MG TABLET,DELAYED RELEASE
ORAL_TABLET | Freq: Two times a day (BID) | ORAL | 0 refills | 30 days | Status: SS
Start: 2020-08-04 — End: 2020-09-03

## 2020-08-04 MED ORDER — MIDODRINE 5 MG TABLET
ORAL_TABLET | 2 refills | 0 days | Status: SS
Start: 2020-08-04 — End: ?

## 2020-08-04 MED ORDER — TACROLIMUS 1 MG CAPSULE, IMMEDIATE-RELEASE: 5 mg | capsule | Freq: Two times a day (BID) | 11 refills | 30 days | Status: AC

## 2020-08-04 MED ORDER — HEPARIN (PORCINE) 5,000 UNIT/ML INJECTION SOLUTION
Freq: Three times a day (TID) | SUBCUTANEOUS | 0 refills | 1 days | Status: SS
Start: 2020-08-04 — End: ?

## 2020-08-04 MED ORDER — SIMETHICONE 80 MG CHEWABLE TABLET
ORAL_TABLET | Freq: Three times a day (TID) | ORAL | 3 refills | 30 days | Status: SS
Start: 2020-08-04 — End: ?

## 2020-08-05 ENCOUNTER — Ambulatory Visit
Admission: TF | Admit: 2020-08-05 | Discharge: 2020-08-20 | Disposition: A | Discharge status: Home Health | Payer: PRIVATE HEALTH INSURANCE | Source: Intra-hospital

## 2020-08-05 ENCOUNTER — Encounter
Admission: TF | Admit: 2020-08-05 | Discharge: 2020-08-20 | Disposition: A | Discharge status: Home Health | Payer: PRIVATE HEALTH INSURANCE | Source: Intra-hospital | Attending: Registered Nurse

## 2020-08-05 MED ORDER — PREDNISONE 2.5 MG TABLET
ORAL_TABLET | Freq: Every day | ORAL | 2 refills | 30 days | Status: SS
Start: 2020-08-05 — End: ?

## 2020-08-05 MED ORDER — ATOVAQUONE 750 MG/5 ML ORAL SUSPENSION
Freq: Every day | ORAL | 2 refills | 21.00000 days | Status: SS
Start: 2020-08-05 — End: ?

## 2020-08-05 MED ORDER — ASPIRIN 81 MG CHEWABLE TABLET
ORAL_TABLET | Freq: Every day | ORAL | 2 refills | 30.00000 days | Status: SS
Start: 2020-08-05 — End: ?

## 2020-08-08 DIAGNOSIS — D631 Anemia in chronic kidney disease: Principal | ICD-10-CM

## 2020-08-08 DIAGNOSIS — Z941 Heart transplant status: Principal | ICD-10-CM

## 2020-08-08 DIAGNOSIS — Z934 Other artificial openings of gastrointestinal tract status: Principal | ICD-10-CM

## 2020-08-08 DIAGNOSIS — I2510 Atherosclerotic heart disease of native coronary artery without angina pectoris: Principal | ICD-10-CM

## 2020-08-08 DIAGNOSIS — I5022 Chronic systolic (congestive) heart failure: Principal | ICD-10-CM

## 2020-08-08 DIAGNOSIS — Z85828 Personal history of other malignant neoplasm of skin: Principal | ICD-10-CM

## 2020-08-08 DIAGNOSIS — D509 Iron deficiency anemia, unspecified: Principal | ICD-10-CM

## 2020-08-08 DIAGNOSIS — N19 Unspecified kidney failure: Principal | ICD-10-CM

## 2020-08-08 DIAGNOSIS — Z955 Presence of coronary angioplasty implant and graft: Principal | ICD-10-CM

## 2020-08-08 DIAGNOSIS — E46 Unspecified protein-calorie malnutrition: Principal | ICD-10-CM

## 2020-08-08 DIAGNOSIS — Z992 Dependence on renal dialysis: Principal | ICD-10-CM

## 2020-08-08 DIAGNOSIS — Z6820 Body mass index (BMI) 20.0-20.9, adult: Principal | ICD-10-CM

## 2020-08-08 DIAGNOSIS — Z930 Tracheostomy status: Principal | ICD-10-CM

## 2020-08-08 DIAGNOSIS — I255 Ischemic cardiomyopathy: Principal | ICD-10-CM

## 2020-08-08 DIAGNOSIS — M545 Low back pain, unspecified: Principal | ICD-10-CM

## 2020-08-08 DIAGNOSIS — I252 Old myocardial infarction: Principal | ICD-10-CM

## 2020-08-11 DIAGNOSIS — Z6820 Body mass index (BMI) 20.0-20.9, adult: Principal | ICD-10-CM

## 2020-08-11 DIAGNOSIS — D509 Iron deficiency anemia, unspecified: Principal | ICD-10-CM

## 2020-08-11 DIAGNOSIS — E46 Unspecified protein-calorie malnutrition: Principal | ICD-10-CM

## 2020-08-11 DIAGNOSIS — I5022 Chronic systolic (congestive) heart failure: Principal | ICD-10-CM

## 2020-08-11 DIAGNOSIS — N19 Unspecified kidney failure: Principal | ICD-10-CM

## 2020-08-11 DIAGNOSIS — I255 Ischemic cardiomyopathy: Principal | ICD-10-CM

## 2020-08-11 DIAGNOSIS — D631 Anemia in chronic kidney disease: Principal | ICD-10-CM

## 2020-08-11 DIAGNOSIS — Z941 Heart transplant status: Principal | ICD-10-CM

## 2020-08-11 DIAGNOSIS — I2510 Atherosclerotic heart disease of native coronary artery without angina pectoris: Principal | ICD-10-CM

## 2020-08-11 DIAGNOSIS — Z992 Dependence on renal dialysis: Principal | ICD-10-CM

## 2020-08-11 DIAGNOSIS — Z934 Other artificial openings of gastrointestinal tract status: Principal | ICD-10-CM

## 2020-08-11 DIAGNOSIS — Z955 Presence of coronary angioplasty implant and graft: Principal | ICD-10-CM

## 2020-08-11 DIAGNOSIS — Z930 Tracheostomy status: Principal | ICD-10-CM

## 2020-08-11 DIAGNOSIS — Z85828 Personal history of other malignant neoplasm of skin: Principal | ICD-10-CM

## 2020-08-11 DIAGNOSIS — I252 Old myocardial infarction: Principal | ICD-10-CM

## 2020-08-11 DIAGNOSIS — M545 Low back pain, unspecified: Principal | ICD-10-CM

## 2020-08-13 DIAGNOSIS — N19 Unspecified kidney failure: Principal | ICD-10-CM

## 2020-08-13 DIAGNOSIS — Z941 Heart transplant status: Principal | ICD-10-CM

## 2020-08-13 DIAGNOSIS — Z6820 Body mass index (BMI) 20.0-20.9, adult: Principal | ICD-10-CM

## 2020-08-13 DIAGNOSIS — E46 Unspecified protein-calorie malnutrition: Principal | ICD-10-CM

## 2020-08-13 DIAGNOSIS — Z955 Presence of coronary angioplasty implant and graft: Principal | ICD-10-CM

## 2020-08-13 DIAGNOSIS — I2510 Atherosclerotic heart disease of native coronary artery without angina pectoris: Principal | ICD-10-CM

## 2020-08-13 DIAGNOSIS — D631 Anemia in chronic kidney disease: Principal | ICD-10-CM

## 2020-08-13 DIAGNOSIS — Z992 Dependence on renal dialysis: Principal | ICD-10-CM

## 2020-08-13 DIAGNOSIS — Z934 Other artificial openings of gastrointestinal tract status: Principal | ICD-10-CM

## 2020-08-13 DIAGNOSIS — D509 Iron deficiency anemia, unspecified: Principal | ICD-10-CM

## 2020-08-13 DIAGNOSIS — Z930 Tracheostomy status: Principal | ICD-10-CM

## 2020-08-13 DIAGNOSIS — Z85828 Personal history of other malignant neoplasm of skin: Principal | ICD-10-CM

## 2020-08-13 DIAGNOSIS — I255 Ischemic cardiomyopathy: Principal | ICD-10-CM

## 2020-08-13 DIAGNOSIS — I5022 Chronic systolic (congestive) heart failure: Principal | ICD-10-CM

## 2020-08-13 DIAGNOSIS — I252 Old myocardial infarction: Principal | ICD-10-CM

## 2020-08-13 DIAGNOSIS — M545 Low back pain, unspecified: Principal | ICD-10-CM

## 2020-08-15 DIAGNOSIS — I5022 Chronic systolic (congestive) heart failure: Principal | ICD-10-CM

## 2020-08-15 DIAGNOSIS — Z934 Other artificial openings of gastrointestinal tract status: Principal | ICD-10-CM

## 2020-08-15 DIAGNOSIS — D509 Iron deficiency anemia, unspecified: Principal | ICD-10-CM

## 2020-08-15 DIAGNOSIS — M545 Low back pain, unspecified: Principal | ICD-10-CM

## 2020-08-15 DIAGNOSIS — Z85828 Personal history of other malignant neoplasm of skin: Principal | ICD-10-CM

## 2020-08-15 DIAGNOSIS — N19 Unspecified kidney failure: Principal | ICD-10-CM

## 2020-08-15 DIAGNOSIS — Z930 Tracheostomy status: Principal | ICD-10-CM

## 2020-08-15 DIAGNOSIS — Z6820 Body mass index (BMI) 20.0-20.9, adult: Principal | ICD-10-CM

## 2020-08-15 DIAGNOSIS — I255 Ischemic cardiomyopathy: Principal | ICD-10-CM

## 2020-08-15 DIAGNOSIS — I2510 Atherosclerotic heart disease of native coronary artery without angina pectoris: Principal | ICD-10-CM

## 2020-08-15 DIAGNOSIS — E46 Unspecified protein-calorie malnutrition: Principal | ICD-10-CM

## 2020-08-15 DIAGNOSIS — Z941 Heart transplant status: Principal | ICD-10-CM

## 2020-08-15 DIAGNOSIS — Z955 Presence of coronary angioplasty implant and graft: Principal | ICD-10-CM

## 2020-08-15 DIAGNOSIS — I252 Old myocardial infarction: Principal | ICD-10-CM

## 2020-08-15 DIAGNOSIS — D631 Anemia in chronic kidney disease: Principal | ICD-10-CM

## 2020-08-15 DIAGNOSIS — Z992 Dependence on renal dialysis: Principal | ICD-10-CM

## 2020-08-18 MED ORDER — ATORVASTATIN 20 MG TABLET
ORAL_TABLET | Freq: Every day | ORAL | 11 refills | 30 days | Status: CP
Start: 2020-08-18 — End: ?
  Filled 2020-08-20: qty 30, 30d supply, fill #0

## 2020-08-18 MED ORDER — PANTOPRAZOLE 40 MG TABLET,DELAYED RELEASE
ORAL_TABLET | Freq: Every day | ORAL | 11 refills | 30 days | Status: CP
Start: 2020-08-18 — End: ?
  Filled 2020-08-20: qty 30, 30d supply, fill #0

## 2020-08-18 MED ORDER — MAGNESIUM OXIDE 400 MG (241.3 MG MAGNESIUM) TABLET
ORAL_TABLET | Freq: Two times a day (BID) | ORAL | 11 refills | 60 days | Status: CP
Start: 2020-08-18 — End: ?

## 2020-08-18 MED ORDER — CALCIUM CARBONATE 600 MG CALCIUM (1,500 MG) TABLET
ORAL_TABLET | Freq: Two times a day (BID) | ORAL | 11 refills | 30 days | Status: CP
Start: 2020-08-18 — End: ?
  Filled 2020-08-20: qty 60, 30d supply, fill #0

## 2020-08-18 MED ORDER — ASPIRIN 81 MG CHEWABLE TABLET
ORAL_TABLET | Freq: Every day | ORAL | 11 refills | 36 days | Status: CP
Start: 2020-08-18 — End: ?
  Filled 2020-08-20: qty 36, 36d supply, fill #0

## 2020-08-18 MED ORDER — TACROLIMUS 1 MG CAPSULE, IMMEDIATE-RELEASE
ORAL_CAPSULE | 11 refills | 0 days
Start: 2020-08-18 — End: ?

## 2020-08-18 MED ORDER — POLYETHYLENE GLYCOL 3350 17 GRAM ORAL POWDER PACKET
PACK | Freq: Every day | ORAL | 2 refills | 30 days | PRN
Start: 2020-08-18 — End: 2020-09-17

## 2020-08-18 MED ORDER — VALGANCICLOVIR 450 MG TABLET
ORAL_TABLET | ORAL | 2 refills | 42 days | Status: CP
Start: 2020-08-18 — End: ?
  Filled 2020-08-20: qty 8, 28d supply, fill #0

## 2020-08-18 MED ORDER — SIMETHICONE 80 MG CHEWABLE TABLET
ORAL_TABLET | Freq: Three times a day (TID) | ORAL | 2 refills | 17 days | Status: CP | PRN
Start: 2020-08-18 — End: ?
  Filled 2020-08-20: qty 100, 17d supply, fill #0

## 2020-08-18 MED ORDER — MYCOPHENOLATE MOFETIL 250 MG CAPSULE
ORAL_CAPSULE | Freq: Two times a day (BID) | ORAL | 11 refills | 30 days
Start: 2020-08-18 — End: ?

## 2020-08-18 MED ORDER — TRAZODONE 50 MG TABLET
ORAL_TABLET | Freq: Every evening | ORAL | 2 refills | 60 days | Status: CP | PRN
Start: 2020-08-18 — End: 2020-09-17
  Filled 2020-08-20: qty 15, 30d supply, fill #0

## 2020-08-18 MED ORDER — DICLOFENAC 1 % TOPICAL GEL
Freq: Three times a day (TID) | TOPICAL | 0 refills | 17 days | Status: CP | PRN
Start: 2020-08-18 — End: ?
  Filled 2020-08-20: qty 100, 17d supply, fill #0

## 2020-08-18 MED ORDER — MIDODRINE 5 MG TABLET
ORAL_TABLET | Freq: Every day | ORAL | 2 refills | 30 days | Status: CP | PRN
Start: 2020-08-18 — End: ?
  Filled 2020-08-20: qty 12, 28d supply, fill #0

## 2020-08-18 MED ORDER — CHOLECALCIFEROL (VITAMIN D3) 125 MCG (5,000 UNIT) TABLET
ORAL_TABLET | Freq: Every day | ORAL | 11 refills | 30 days | Status: CP
Start: 2020-08-18 — End: ?
  Filled 2020-08-20: qty 100, 100d supply, fill #0

## 2020-08-18 MED ORDER — DRONABINOL 2.5 MG CAPSULE
ORAL_CAPSULE | Freq: Two times a day (BID) | ORAL | 0 refills | 30 days | Status: CP
Start: 2020-08-18 — End: 2020-09-17
  Filled 2020-08-20: qty 60, 30d supply, fill #0

## 2020-08-18 MED ORDER — FLUCONAZOLE 200 MG TABLET
ORAL_TABLET | Freq: Every evening | ORAL | 1 refills | 30 days | Status: CP
Start: 2020-08-18 — End: ?

## 2020-08-18 MED ORDER — MIRTAZAPINE 15 MG TABLET
ORAL_TABLET | Freq: Every evening | ORAL | 2 refills | 30 days | Status: CP
Start: 2020-08-18 — End: ?
  Filled 2020-08-20: qty 30, 30d supply, fill #0

## 2020-08-19 DIAGNOSIS — I5022 Chronic systolic (congestive) heart failure: Principal | ICD-10-CM

## 2020-08-19 DIAGNOSIS — Z955 Presence of coronary angioplasty implant and graft: Principal | ICD-10-CM

## 2020-08-19 DIAGNOSIS — Z941 Heart transplant status: Principal | ICD-10-CM

## 2020-08-19 DIAGNOSIS — Z6820 Body mass index (BMI) 20.0-20.9, adult: Principal | ICD-10-CM

## 2020-08-19 DIAGNOSIS — E46 Unspecified protein-calorie malnutrition: Principal | ICD-10-CM

## 2020-08-19 DIAGNOSIS — M545 Low back pain, unspecified: Principal | ICD-10-CM

## 2020-08-19 DIAGNOSIS — I2510 Atherosclerotic heart disease of native coronary artery without angina pectoris: Principal | ICD-10-CM

## 2020-08-19 DIAGNOSIS — D509 Iron deficiency anemia, unspecified: Principal | ICD-10-CM

## 2020-08-19 DIAGNOSIS — N19 Unspecified kidney failure: Principal | ICD-10-CM

## 2020-08-19 DIAGNOSIS — Z934 Other artificial openings of gastrointestinal tract status: Principal | ICD-10-CM

## 2020-08-19 DIAGNOSIS — Z930 Tracheostomy status: Principal | ICD-10-CM

## 2020-08-19 DIAGNOSIS — Z992 Dependence on renal dialysis: Principal | ICD-10-CM

## 2020-08-19 DIAGNOSIS — I255 Ischemic cardiomyopathy: Principal | ICD-10-CM

## 2020-08-19 DIAGNOSIS — D631 Anemia in chronic kidney disease: Principal | ICD-10-CM

## 2020-08-19 DIAGNOSIS — Z85828 Personal history of other malignant neoplasm of skin: Principal | ICD-10-CM

## 2020-08-19 DIAGNOSIS — I252 Old myocardial infarction: Principal | ICD-10-CM

## 2020-08-19 MED ORDER — MAGNESIUM OXIDE 400 MG (241.3 MG MAGNESIUM) TABLET
ORAL_TABLET | Freq: Every day | ORAL | 11 refills | 120 days | Status: CP
Start: 2020-08-19 — End: ?
  Filled 2020-08-20: qty 120, 120d supply, fill #0

## 2020-08-20 MED ORDER — TORSEMIDE 20 MG TABLET
ORAL_TABLET | Freq: Every day | ORAL | 0 refills | 30 days | Status: CP
Start: 2020-08-20 — End: 2020-09-19
  Filled 2020-08-20: qty 90, 30d supply, fill #0

## 2020-08-20 MED ORDER — TACROLIMUS 1 MG CAPSULE, IMMEDIATE-RELEASE
ORAL_CAPSULE | 11 refills | 0 days
Start: 2020-08-20 — End: ?

## 2020-08-20 MED FILL — CHOLECALCIFEROL (VITAMIN D3) 125 MCG (5,000 UNIT) TABLET: 100 days supply | Qty: 100 | Fill #0 | Status: AC

## 2020-08-20 MED FILL — ATORVASTATIN 20 MG TABLET: 30 days supply | Qty: 30 | Fill #0 | Status: AC

## 2020-08-20 MED FILL — GAS RELIEF 80 (SIMETHICONE) 80 MG CHEWABLE TABLET: 17 days supply | Qty: 100 | Fill #0 | Status: AC

## 2020-08-20 MED FILL — DICLOFENAC 1 % TOPICAL GEL: 17 days supply | Qty: 100 | Fill #0 | Status: AC

## 2020-08-20 MED FILL — TORSEMIDE 20 MG TABLET: 30 days supply | Qty: 90 | Fill #0 | Status: AC

## 2020-08-20 MED FILL — PANTOPRAZOLE 40 MG TABLET,DELAYED RELEASE: 30 days supply | Qty: 30 | Fill #0 | Status: AC

## 2020-08-20 MED FILL — MAGNESIUM OXIDE 400 MG (241.3 MG MAGNESIUM) TABLET: 120 days supply | Qty: 120 | Fill #0 | Status: AC

## 2020-08-20 MED FILL — ASPIRIN 81 MG CHEWABLE TABLET: 36 days supply | Qty: 36 | Fill #0 | Status: AC

## 2020-08-20 MED FILL — DRONABINOL 2.5 MG CAPSULE: 30 days supply | Qty: 60 | Fill #0 | Status: AC

## 2020-08-20 MED FILL — MIDODRINE 5 MG TABLET: 28 days supply | Qty: 12 | Fill #0 | Status: AC

## 2020-08-20 MED FILL — CALCIUM CARBONATE 600 MG CALCIUM (1,500 MG) TABLET: 30 days supply | Qty: 60 | Fill #0 | Status: AC

## 2020-08-20 MED FILL — VALGANCICLOVIR 450 MG TABLET: 28 days supply | Qty: 8 | Fill #0 | Status: AC

## 2020-08-20 MED FILL — MIRTAZAPINE 15 MG TABLET: 30 days supply | Qty: 30 | Fill #0 | Status: AC

## 2020-08-20 MED FILL — TRAZODONE 50 MG TABLET: 30 days supply | Qty: 15 | Fill #0 | Status: AC

## 2020-08-21 DIAGNOSIS — N186 End stage renal disease: Secondary | ICD-10-CM | POA: Diagnosis present

## 2020-08-24 DIAGNOSIS — T86298 Other complications of heart transplant: Principal | ICD-10-CM

## 2020-08-24 DIAGNOSIS — Z941 Heart transplant status: Principal | ICD-10-CM

## 2020-08-24 DIAGNOSIS — I5022 Chronic systolic (congestive) heart failure: Principal | ICD-10-CM

## 2020-08-27 ENCOUNTER — Ambulatory Visit
Admit: 2020-08-27 | Discharge: 2020-08-28 | Payer: PRIVATE HEALTH INSURANCE | Attending: Registered" | Primary: Registered"

## 2020-08-27 ENCOUNTER — Ambulatory Visit: Admit: 2020-08-27 | Discharge: 2020-08-28 | Payer: PRIVATE HEALTH INSURANCE

## 2020-08-27 ENCOUNTER — Ambulatory Visit
Admit: 2020-08-27 | Discharge: 2020-08-28 | Payer: PRIVATE HEALTH INSURANCE | Attending: Adult Health | Primary: Adult Health

## 2020-08-27 DIAGNOSIS — I2510 Atherosclerotic heart disease of native coronary artery without angina pectoris: Principal | ICD-10-CM

## 2020-08-27 DIAGNOSIS — I5022 Chronic systolic (congestive) heart failure: Principal | ICD-10-CM

## 2020-08-27 DIAGNOSIS — T86298 Other complications of heart transplant: Principal | ICD-10-CM

## 2020-08-27 DIAGNOSIS — N186 End stage renal disease: Principal | ICD-10-CM

## 2020-08-27 DIAGNOSIS — J986 Disorders of diaphragm: Principal | ICD-10-CM

## 2020-08-27 DIAGNOSIS — Z886 Allergy status to analgesic agent status: Principal | ICD-10-CM

## 2020-08-27 DIAGNOSIS — K635 Polyp of colon: Principal | ICD-10-CM

## 2020-08-27 DIAGNOSIS — M1990 Unspecified osteoarthritis, unspecified site: Principal | ICD-10-CM

## 2020-08-27 DIAGNOSIS — J90 Pleural effusion, not elsewhere classified: Principal | ICD-10-CM

## 2020-08-27 DIAGNOSIS — K21 Gastroesophageal reflux disease with esophagitis without hemorrhage: Principal | ICD-10-CM

## 2020-08-27 DIAGNOSIS — Z941 Heart transplant status: Principal | ICD-10-CM

## 2020-08-27 DIAGNOSIS — I255 Ischemic cardiomyopathy: Principal | ICD-10-CM

## 2020-08-27 DIAGNOSIS — I272 Pulmonary hypertension, unspecified: Principal | ICD-10-CM

## 2020-08-27 DIAGNOSIS — Z48298 Encounter for aftercare following other organ transplant: Principal | ICD-10-CM

## 2020-08-27 DIAGNOSIS — K209 Esophagitis, unspecified without bleeding: Principal | ICD-10-CM

## 2020-08-27 DIAGNOSIS — Z992 Dependence on renal dialysis: Principal | ICD-10-CM

## 2020-08-27 DIAGNOSIS — J029 Acute pharyngitis, unspecified: Principal | ICD-10-CM

## 2020-08-27 DIAGNOSIS — J9811 Atelectasis: Principal | ICD-10-CM

## 2020-08-27 DIAGNOSIS — I252 Old myocardial infarction: Principal | ICD-10-CM

## 2020-08-27 DIAGNOSIS — I10 Essential (primary) hypertension: Principal | ICD-10-CM

## 2020-08-27 MED ORDER — ASPIRIN 81 MG TABLET,DELAYED RELEASE
ORAL_TABLET | Freq: Every day | ORAL | 3 refills | 90.00000 days | Status: CP
Start: 2020-08-27 — End: 2021-08-27
  Filled 2020-09-10: qty 90, 90d supply, fill #0

## 2020-08-27 MED ORDER — TORSEMIDE 20 MG TABLET
ORAL_TABLET | Freq: Every day | ORAL | 11 refills | 0.00000 days | Status: CP
Start: 2020-08-27 — End: 2020-08-27

## 2020-08-27 MED ORDER — TORSEMIDE 20 MG TABLET: tablet | 11 refills | 0 days | Status: AC

## 2020-08-27 MED ORDER — PANTOPRAZOLE 40 MG TABLET,DELAYED RELEASE
ORAL_TABLET | Freq: Two times a day (BID) | ORAL | 11 refills | 30 days | Status: CP
Start: 2020-08-27 — End: ?
  Filled 2020-09-03: qty 60, 30d supply, fill #0

## 2020-09-01 ENCOUNTER — Ambulatory Visit: Admit: 2020-09-01 | Discharge: 2020-09-01 | Payer: PRIVATE HEALTH INSURANCE

## 2020-09-01 ENCOUNTER — Ambulatory Visit
Admit: 2020-09-01 | Discharge: 2020-09-01 | Payer: PRIVATE HEALTH INSURANCE | Attending: Adult Health | Primary: Adult Health

## 2020-09-01 ENCOUNTER — Ambulatory Visit
Admit: 2020-09-01 | Discharge: 2020-09-01 | Payer: PRIVATE HEALTH INSURANCE | Attending: Infectious Disease | Primary: Infectious Disease

## 2020-09-01 DIAGNOSIS — Z48298 Encounter for aftercare following other organ transplant: Principal | ICD-10-CM

## 2020-09-01 DIAGNOSIS — J9811 Atelectasis: Principal | ICD-10-CM

## 2020-09-01 DIAGNOSIS — T86298 Other complications of heart transplant: Principal | ICD-10-CM

## 2020-09-01 DIAGNOSIS — J811 Chronic pulmonary edema: Principal | ICD-10-CM

## 2020-09-01 DIAGNOSIS — E782 Mixed hyperlipidemia: Principal | ICD-10-CM

## 2020-09-01 DIAGNOSIS — F32A Depression, unspecified depression type: Principal | ICD-10-CM

## 2020-09-01 DIAGNOSIS — J151 Pneumonia due to Pseudomonas: Principal | ICD-10-CM

## 2020-09-01 DIAGNOSIS — I10 Essential (primary) hypertension: Principal | ICD-10-CM

## 2020-09-01 DIAGNOSIS — Z992 Dependence on renal dialysis: Principal | ICD-10-CM

## 2020-09-01 DIAGNOSIS — N186 End stage renal disease: Principal | ICD-10-CM

## 2020-09-01 DIAGNOSIS — K209 Esophagitis, unspecified without bleeding: Principal | ICD-10-CM

## 2020-09-01 DIAGNOSIS — Z941 Heart transplant status: Principal | ICD-10-CM

## 2020-09-01 DIAGNOSIS — R112 Nausea with vomiting, unspecified: Principal | ICD-10-CM

## 2020-09-01 DIAGNOSIS — Z79899 Other long term (current) drug therapy: Principal | ICD-10-CM

## 2020-09-01 DIAGNOSIS — E638 Other specified nutritional deficiencies: Principal | ICD-10-CM

## 2020-09-01 MED ORDER — ESCITALOPRAM 10 MG TABLET
ORAL_TABLET | Freq: Every day | ORAL | 11 refills | 30 days | Status: CP
Start: 2020-09-01 — End: 2020-12-03
  Filled 2020-09-03: qty 30, 30d supply, fill #0

## 2020-09-01 MED ORDER — METOCLOPRAMIDE 5 MG TABLET
ORAL_TABLET | Freq: Two times a day (BID) | ORAL | 1 refills | 30.00000 days | Status: CP
Start: 2020-09-01 — End: 2020-12-03
  Filled 2020-09-03: qty 30, 30d supply, fill #0

## 2020-09-02 DIAGNOSIS — J90 Pleural effusion, not elsewhere classified: Principal | ICD-10-CM

## 2020-09-02 DIAGNOSIS — Z941 Heart transplant status: Principal | ICD-10-CM

## 2020-09-02 DIAGNOSIS — T86298 Other complications of heart transplant: Principal | ICD-10-CM

## 2020-09-02 MED ORDER — TORSEMIDE 20 MG TABLET
ORAL_TABLET | 11 refills | 0 days | Status: SS
Start: 2020-09-02 — End: 2020-12-01
  Filled 2020-09-14: qty 90, 30d supply, fill #0

## 2020-09-03 MED FILL — METOCLOPRAMIDE 5 MG TABLET: 30 days supply | Qty: 30 | Fill #0 | Status: AC

## 2020-09-03 MED FILL — ESCITALOPRAM 10 MG TABLET: 30 days supply | Qty: 30 | Fill #0 | Status: AC

## 2020-09-03 MED FILL — PANTOPRAZOLE 40 MG TABLET,DELAYED RELEASE: 30 days supply | Qty: 60 | Fill #0 | Status: AC

## 2020-09-04 DIAGNOSIS — D631 Anemia in chronic kidney disease: Secondary | ICD-10-CM | POA: Insufficient documentation

## 2020-09-06 MED ORDER — DILTIAZEM ER 120 MG CAPSULE,EXTENDED RELEASE 12 HR
ORAL_CAPSULE | Freq: Every evening | ORAL | 3 refills | 90.00000 days | Status: CP
Start: 2020-09-06 — End: 2020-09-07

## 2020-09-07 ENCOUNTER — Other Ambulatory Visit (HOSPITAL_COMMUNITY)
Admission: AD | Admit: 2020-09-07 | Discharge: 2020-09-07 | Disposition: A | Discharge status: Home / Self Care | Payer: 59 | Source: Ambulatory Visit | Attending: Adult Health Nurse Practitioner | Admitting: Adult Health Nurse Practitioner

## 2020-09-07 DIAGNOSIS — Z941 Heart transplant status: Secondary | ICD-10-CM | POA: Insufficient documentation

## 2020-09-07 DIAGNOSIS — R7989 Other specified abnormal findings of blood chemistry: Secondary | ICD-10-CM | POA: Diagnosis not present

## 2020-09-07 DIAGNOSIS — Z48298 Encounter for aftercare following other organ transplant: Secondary | ICD-10-CM | POA: Insufficient documentation

## 2020-09-07 DIAGNOSIS — Z79899 Other long term (current) drug therapy: Secondary | ICD-10-CM | POA: Diagnosis not present

## 2020-09-07 DIAGNOSIS — B259 Cytomegaloviral disease, unspecified: Secondary | ICD-10-CM | POA: Insufficient documentation

## 2020-09-07 DIAGNOSIS — T86298 Other complications of heart transplant: Principal | ICD-10-CM

## 2020-09-07 DIAGNOSIS — I10 Essential (primary) hypertension: Principal | ICD-10-CM

## 2020-09-07 LAB — CBC WITH DIFFERENTIAL/PLATELET
Abs Immature Granulocytes: 0.13 10*3/uL — ABNORMAL HIGH (ref 0.00–0.07)
Basophils Absolute: 0 10*3/uL (ref 0.0–0.1)
Basophils Relative: 1 %
Eosinophils Absolute: 0 10*3/uL (ref 0.0–0.5)
Eosinophils Relative: 1 %
HCT: 34.8 % — ABNORMAL LOW (ref 39.0–52.0)
Hemoglobin: 10.1 g/dL — ABNORMAL LOW (ref 13.0–17.0)
Immature Granulocytes: 6 %
Lymphocytes Relative: 11 %
Lymphs Abs: 0.3 10*3/uL — ABNORMAL LOW (ref 0.7–4.0)
MCH: 27.6 pg (ref 26.0–34.0)
MCHC: 29 g/dL — ABNORMAL LOW (ref 30.0–36.0)
MCV: 95.1 fL (ref 80.0–100.0)
Monocytes Absolute: 0.5 10*3/uL (ref 0.1–1.0)
Monocytes Relative: 22 %
Neutro Abs: 1.4 10*3/uL — ABNORMAL LOW (ref 1.7–7.7)
Neutrophils Relative %: 59 %
Platelets: 162 10*3/uL (ref 150–400)
RBC: 3.66 MIL/uL — ABNORMAL LOW (ref 4.22–5.81)
RDW: 16.1 % — ABNORMAL HIGH (ref 11.5–15.5)
WBC: 2.4 10*3/uL — ABNORMAL LOW (ref 4.0–10.5)
nRBC: 0 % (ref 0.0–0.2)

## 2020-09-07 LAB — BASIC METABOLIC PANEL
Anion gap: 18 — ABNORMAL HIGH (ref 5–15)
BUN: 43 mg/dL — ABNORMAL HIGH (ref 8–23)
CO2: 26 mmol/L (ref 22–32)
Calcium: 10.3 mg/dL (ref 8.9–10.3)
Chloride: 93 mmol/L — ABNORMAL LOW (ref 98–111)
Creatinine, Ser: 5.17 mg/dL — ABNORMAL HIGH (ref 0.61–1.24)
GFR, Estimated: 12 mL/min — ABNORMAL LOW (ref >60)
Glucose, Bld: 87 mg/dL (ref 70–99)
Potassium: 4.8 mmol/L (ref 3.5–5.1)
Sodium: 137 mmol/L (ref 135–145)

## 2020-09-07 LAB — MAGNESIUM: Magnesium: 2.1 mg/dL (ref 1.7–2.4)

## 2020-09-07 MED ORDER — DILTIAZEM CD 120 MG CAPSULE,EXTENDED RELEASE 24 HR
ORAL_CAPSULE | Freq: Every day | ORAL | 11 refills | 30.00000 days | Status: CP
Start: 2020-09-07 — End: 2020-12-03

## 2020-09-09 ENCOUNTER — Ambulatory Visit
Admit: 2020-09-09 | Discharge: 2020-09-10 | Payer: PRIVATE HEALTH INSURANCE | Attending: Student in an Organized Health Care Education/Training Program | Primary: Student in an Organized Health Care Education/Training Program

## 2020-09-09 ENCOUNTER — Ambulatory Visit: Admit: 2020-09-09 | Discharge: 2020-09-10 | Payer: PRIVATE HEALTH INSURANCE

## 2020-09-09 DIAGNOSIS — M1990 Unspecified osteoarthritis, unspecified site: Principal | ICD-10-CM

## 2020-09-09 DIAGNOSIS — J90 Pleural effusion, not elsewhere classified: Principal | ICD-10-CM

## 2020-09-09 DIAGNOSIS — Z4821 Encounter for aftercare following heart transplant: Principal | ICD-10-CM

## 2020-09-09 DIAGNOSIS — Z79899 Other long term (current) drug therapy: Principal | ICD-10-CM

## 2020-09-09 DIAGNOSIS — T86298 Other complications of heart transplant: Principal | ICD-10-CM

## 2020-09-09 DIAGNOSIS — N186 End stage renal disease: Principal | ICD-10-CM

## 2020-09-09 DIAGNOSIS — R6881 Early satiety: Principal | ICD-10-CM

## 2020-09-09 DIAGNOSIS — Z934 Other artificial openings of gastrointestinal tract status: Principal | ICD-10-CM

## 2020-09-09 DIAGNOSIS — Z85828 Personal history of other malignant neoplasm of skin: Principal | ICD-10-CM

## 2020-09-09 DIAGNOSIS — K5900 Constipation, unspecified: Principal | ICD-10-CM

## 2020-09-09 DIAGNOSIS — Z7982 Long term (current) use of aspirin: Principal | ICD-10-CM

## 2020-09-09 DIAGNOSIS — I252 Old myocardial infarction: Principal | ICD-10-CM

## 2020-09-09 DIAGNOSIS — K5909 Other constipation: Principal | ICD-10-CM

## 2020-09-09 MED ORDER — NYSTATIN 100,000 UNIT/ML ORAL SUSPENSION
Freq: Four times a day (QID) | ORAL | 3 refills | 15.00000 days
Start: 2020-09-09 — End: 2020-12-03

## 2020-09-10 LAB — CMV DNA BY PCR, QUALITATIVE: CMV DNA, Qual PCR: NEGATIVE

## 2020-09-10 MED FILL — MIDODRINE 5 MG TABLET: 28 days supply | Qty: 12 | Fill #0 | Status: AC

## 2020-09-10 MED FILL — MIRTAZAPINE 15 MG TABLET: 30 days supply | Qty: 30 | Fill #0 | Status: AC

## 2020-09-10 MED FILL — ATORVASTATIN 20 MG TABLET: 30 days supply | Qty: 30 | Fill #0 | Status: AC

## 2020-09-10 MED FILL — MIDODRINE 5 MG TABLET: ORAL | 28 days supply | Qty: 12 | Fill #0

## 2020-09-10 MED FILL — ASPIRIN 81 MG TABLET,DELAYED RELEASE: 90 days supply | Qty: 90 | Fill #0 | Status: AC

## 2020-09-10 MED FILL — ATORVASTATIN 20 MG TABLET: ORAL | 30 days supply | Qty: 30 | Fill #0

## 2020-09-10 MED FILL — CALCIUM CARBONATE 600 MG CALCIUM (1,500 MG) TABLET: 30 days supply | Qty: 60 | Fill #0 | Status: AC

## 2020-09-10 MED FILL — MIRTAZAPINE 15 MG TABLET: ORAL | 30 days supply | Qty: 30 | Fill #0

## 2020-09-10 MED FILL — CALCIUM CARBONATE 600 MG CALCIUM (1,500 MG) TABLET: ORAL | 30 days supply | Qty: 60 | Fill #0

## 2020-09-12 LAB — TACROLIMUS LEVEL: Tacrolimus (FK506) - LabCorp: 8.8 ng/mL (ref 2.0–20.0)

## 2020-09-14 MED FILL — TORSEMIDE 20 MG TABLET: 30 days supply | Qty: 90 | Fill #0 | Status: AC

## 2020-09-15 ENCOUNTER — Other Ambulatory Visit (HOSPITAL_COMMUNITY)
Admission: RE | Admit: 2020-09-15 | Discharge: 2020-09-15 | Disposition: A | Discharge status: Home / Self Care | Payer: 59 | Source: Ambulatory Visit | Attending: Adult Health Nurse Practitioner | Admitting: Adult Health Nurse Practitioner

## 2020-09-15 DIAGNOSIS — Z941 Heart transplant status: Secondary | ICD-10-CM | POA: Insufficient documentation

## 2020-09-15 DIAGNOSIS — B259 Cytomegaloviral disease, unspecified: Secondary | ICD-10-CM | POA: Diagnosis not present

## 2020-09-15 DIAGNOSIS — Z79899 Other long term (current) drug therapy: Secondary | ICD-10-CM | POA: Insufficient documentation

## 2020-09-15 DIAGNOSIS — Z48298 Encounter for aftercare following other organ transplant: Secondary | ICD-10-CM | POA: Insufficient documentation

## 2020-09-15 DIAGNOSIS — R7989 Other specified abnormal findings of blood chemistry: Secondary | ICD-10-CM | POA: Insufficient documentation

## 2020-09-15 LAB — BASIC METABOLIC PANEL
Anion gap: 11 (ref 5–15)
BUN: 15 mg/dL (ref 8–23)
CO2: 33 mmol/L — ABNORMAL HIGH (ref 22–32)
Calcium: 9.3 mg/dL (ref 8.9–10.3)
Chloride: 95 mmol/L — ABNORMAL LOW (ref 98–111)
Creatinine, Ser: 3.91 mg/dL — ABNORMAL HIGH (ref 0.61–1.24)
GFR, Estimated: 16 mL/min — ABNORMAL LOW (ref >60)
Glucose, Bld: 99 mg/dL (ref 70–99)
Potassium: 4.2 mmol/L (ref 3.5–5.1)
Sodium: 139 mmol/L (ref 135–145)

## 2020-09-15 LAB — CBC WITH DIFFERENTIAL/PLATELET
Abs Immature Granulocytes: 0.03 10*3/uL (ref 0.00–0.07)
Basophils Absolute: 0 10*3/uL (ref 0.0–0.1)
Basophils Relative: 0 %
Eosinophils Absolute: 0.1 10*3/uL (ref 0.0–0.5)
Eosinophils Relative: 3 %
HCT: 28.9 % — ABNORMAL LOW (ref 39.0–52.0)
Hemoglobin: 8.1 g/dL — ABNORMAL LOW (ref 13.0–17.0)
Immature Granulocytes: 1 %
Lymphocytes Relative: 10 %
Lymphs Abs: 0.3 10*3/uL — ABNORMAL LOW (ref 0.7–4.0)
MCH: 27 pg (ref 26.0–34.0)
MCHC: 28 g/dL — ABNORMAL LOW (ref 30.0–36.0)
MCV: 96.3 fL (ref 80.0–100.0)
Monocytes Absolute: 0.8 10*3/uL (ref 0.1–1.0)
Monocytes Relative: 29 %
Neutro Abs: 1.5 10*3/uL — ABNORMAL LOW (ref 1.7–7.7)
Neutrophils Relative %: 57 %
Platelets: 146 10*3/uL — ABNORMAL LOW (ref 150–400)
RBC: 3 MIL/uL — ABNORMAL LOW (ref 4.22–5.81)
RDW: 16.7 % — ABNORMAL HIGH (ref 11.5–15.5)
WBC: 2.7 10*3/uL — ABNORMAL LOW (ref 4.0–10.5)
nRBC: 0 % (ref 0.0–0.2)

## 2020-09-15 LAB — MAGNESIUM: Magnesium: 2 mg/dL (ref 1.7–2.4)

## 2020-09-15 MED FILL — TRAZODONE 50 MG TABLET: ORAL | 30 days supply | Qty: 15 | Fill #0

## 2020-09-15 MED FILL — TRAZODONE 50 MG TABLET: 30 days supply | Qty: 15 | Fill #0 | Status: AC

## 2020-09-18 LAB — CMV DNA BY PCR, QUALITATIVE: CMV DNA, Qual PCR: NEGATIVE

## 2020-09-18 LAB — TACROLIMUS LEVEL: Tacrolimus (FK506) - LabCorp: 6.5 ng/mL (ref 2.0–20.0)

## 2020-09-19 ENCOUNTER — Emergency Department (HOSPITAL_COMMUNITY)
Admission: EM | Admit: 2020-09-19 | Discharge: 2020-09-19 | Disposition: A | Discharge status: Home / Self Care | Payer: 59 | Attending: Emergency Medicine | Admitting: Emergency Medicine

## 2020-09-19 ENCOUNTER — Emergency Department (HOSPITAL_COMMUNITY): Payer: 59

## 2020-09-19 ENCOUNTER — Encounter (HOSPITAL_COMMUNITY): Payer: Self-pay | Admitting: Emergency Medicine

## 2020-09-19 ENCOUNTER — Other Ambulatory Visit: Payer: Self-pay

## 2020-09-19 ENCOUNTER — Ambulatory Visit
Admit: 2020-09-19 | Discharge: 2020-12-03 | Disposition: A | Discharge status: Home / Self Care | Payer: PRIVATE HEALTH INSURANCE | Source: Other Acute Inpatient Hospital

## 2020-09-19 DIAGNOSIS — Z7982 Long term (current) use of aspirin: Secondary | ICD-10-CM | POA: Insufficient documentation

## 2020-09-19 DIAGNOSIS — R059 Cough, unspecified: Secondary | ICD-10-CM | POA: Insufficient documentation

## 2020-09-19 DIAGNOSIS — E8770 Fluid overload, unspecified: Secondary | ICD-10-CM | POA: Diagnosis not present

## 2020-09-19 DIAGNOSIS — Z20822 Contact with and (suspected) exposure to covid-19: Secondary | ICD-10-CM | POA: Diagnosis not present

## 2020-09-19 DIAGNOSIS — N19 Unspecified kidney failure: Secondary | ICD-10-CM | POA: Insufficient documentation

## 2020-09-19 DIAGNOSIS — Z992 Dependence on renal dialysis: Secondary | ICD-10-CM | POA: Diagnosis not present

## 2020-09-19 DIAGNOSIS — Z79899 Other long term (current) drug therapy: Secondary | ICD-10-CM | POA: Insufficient documentation

## 2020-09-19 DIAGNOSIS — R0602 Shortness of breath: Secondary | ICD-10-CM | POA: Diagnosis present

## 2020-09-19 DIAGNOSIS — J9601 Acute respiratory failure with hypoxia: Principal | ICD-10-CM

## 2020-09-19 DIAGNOSIS — E785 Hyperlipidemia, unspecified: Principal | ICD-10-CM

## 2020-09-19 DIAGNOSIS — J188 Other pneumonia, unspecified organism: Principal | ICD-10-CM

## 2020-09-19 DIAGNOSIS — I2510 Atherosclerotic heart disease of native coronary artery without angina pectoris: Principal | ICD-10-CM

## 2020-09-19 DIAGNOSIS — D61818 Other pancytopenia: Principal | ICD-10-CM

## 2020-09-19 DIAGNOSIS — I5022 Chronic systolic (congestive) heart failure: Principal | ICD-10-CM

## 2020-09-19 DIAGNOSIS — F329 Major depressive disorder, single episode, unspecified: Principal | ICD-10-CM

## 2020-09-19 DIAGNOSIS — Z941 Heart transplant status: Principal | ICD-10-CM

## 2020-09-19 DIAGNOSIS — N2581 Secondary hyperparathyroidism of renal origin: Principal | ICD-10-CM

## 2020-09-19 DIAGNOSIS — R630 Anorexia: Principal | ICD-10-CM

## 2020-09-19 DIAGNOSIS — J9602 Acute respiratory failure with hypercapnia: Principal | ICD-10-CM

## 2020-09-19 DIAGNOSIS — Z6821 Body mass index (BMI) 21.0-21.9, adult: Principal | ICD-10-CM

## 2020-09-19 DIAGNOSIS — A4189 Other specified sepsis: Principal | ICD-10-CM

## 2020-09-19 DIAGNOSIS — N186 End stage renal disease: Principal | ICD-10-CM

## 2020-09-19 DIAGNOSIS — I132 Hypertensive heart and chronic kidney disease with heart failure and with stage 5 chronic kidney disease, or end stage renal disease: Principal | ICD-10-CM

## 2020-09-19 DIAGNOSIS — H9190 Unspecified hearing loss, unspecified ear: Principal | ICD-10-CM

## 2020-09-19 DIAGNOSIS — D500 Iron deficiency anemia secondary to blood loss (chronic): Principal | ICD-10-CM

## 2020-09-19 DIAGNOSIS — I255 Ischemic cardiomyopathy: Principal | ICD-10-CM

## 2020-09-19 DIAGNOSIS — E43 Unspecified severe protein-calorie malnutrition: Principal | ICD-10-CM

## 2020-09-19 DIAGNOSIS — K529 Noninfective gastroenteritis and colitis, unspecified: Principal | ICD-10-CM

## 2020-09-19 DIAGNOSIS — D631 Anemia in chronic kidney disease: Principal | ICD-10-CM

## 2020-09-19 DIAGNOSIS — R627 Adult failure to thrive: Principal | ICD-10-CM

## 2020-09-19 DIAGNOSIS — T827XXA Infection and inflammatory reaction due to other cardiac and vascular devices, implants and grafts, initial encounter: Principal | ICD-10-CM

## 2020-09-19 DIAGNOSIS — J9811 Atelectasis: Principal | ICD-10-CM

## 2020-09-19 DIAGNOSIS — R6881 Early satiety: Principal | ICD-10-CM

## 2020-09-19 DIAGNOSIS — D849 Immunodeficiency, unspecified: Principal | ICD-10-CM

## 2020-09-19 DIAGNOSIS — Z7901 Long term (current) use of anticoagulants: Principal | ICD-10-CM

## 2020-09-19 DIAGNOSIS — T86298 Other complications of heart transplant: Principal | ICD-10-CM

## 2020-09-19 DIAGNOSIS — A319 Mycobacterial infection, unspecified: Principal | ICD-10-CM

## 2020-09-19 DIAGNOSIS — B370 Candidal stomatitis: Principal | ICD-10-CM

## 2020-09-19 DIAGNOSIS — N179 Acute kidney failure, unspecified: Principal | ICD-10-CM

## 2020-09-19 DIAGNOSIS — D689 Coagulation defect, unspecified: Principal | ICD-10-CM

## 2020-09-19 DIAGNOSIS — E46 Unspecified protein-calorie malnutrition: Principal | ICD-10-CM

## 2020-09-19 DIAGNOSIS — G2581 Restless legs syndrome: Principal | ICD-10-CM

## 2020-09-19 LAB — COMPREHENSIVE METABOLIC PANEL
ALT: 9 U/L (ref 0–44)
AST: 14 U/L — ABNORMAL LOW (ref 15–41)
Albumin: 2.8 g/dL — ABNORMAL LOW (ref 3.5–5.0)
Alkaline Phosphatase: 103 U/L (ref 38–126)
Anion gap: 11 (ref 5–15)
BUN: 5 mg/dL — ABNORMAL LOW (ref 8–23)
CO2: 31 mmol/L (ref 22–32)
Calcium: 9 mg/dL (ref 8.9–10.3)
Chloride: 98 mmol/L (ref 98–111)
Creatinine, Ser: 2.27 mg/dL — ABNORMAL HIGH (ref 0.61–1.24)
GFR, Estimated: 32 mL/min — ABNORMAL LOW (ref >60)
Glucose, Bld: 104 mg/dL — ABNORMAL HIGH (ref 70–99)
Potassium: 4.1 mmol/L (ref 3.5–5.1)
Sodium: 140 mmol/L (ref 135–145)
Total Bilirubin: 0.5 mg/dL (ref 0.3–1.2)
Total Protein: 5.7 g/dL — ABNORMAL LOW (ref 6.5–8.1)

## 2020-09-19 LAB — CBC WITH DIFFERENTIAL/PLATELET
Abs Immature Granulocytes: 0.02 10*3/uL (ref 0.00–0.07)
Basophils Absolute: 0 10*3/uL (ref 0.0–0.1)
Basophils Relative: 0 %
Eosinophils Absolute: 0.1 10*3/uL (ref 0.0–0.5)
Eosinophils Relative: 2 %
HCT: 30.2 % — ABNORMAL LOW (ref 39.0–52.0)
Hemoglobin: 8.5 g/dL — ABNORMAL LOW (ref 13.0–17.0)
Immature Granulocytes: 1 %
Lymphocytes Relative: 7 %
Lymphs Abs: 0.2 10*3/uL — ABNORMAL LOW (ref 0.7–4.0)
MCH: 27.5 pg (ref 26.0–34.0)
MCHC: 28.1 g/dL — ABNORMAL LOW (ref 30.0–36.0)
MCV: 97.7 fL (ref 80.0–100.0)
Monocytes Absolute: 0.7 10*3/uL (ref 0.1–1.0)
Monocytes Relative: 24 %
Neutro Abs: 1.9 10*3/uL (ref 1.7–7.7)
Neutrophils Relative %: 66 %
Platelets: UNDETERMINED 10*3/uL (ref 150–400)
RBC: 3.09 MIL/uL — ABNORMAL LOW (ref 4.22–5.81)
RDW: 16.6 % — ABNORMAL HIGH (ref 11.5–15.5)
WBC: 2.9 10*3/uL — ABNORMAL LOW (ref 4.0–10.5)
nRBC: 0 % (ref 0.0–0.2)

## 2020-09-19 LAB — RESP PANEL BY RT-PCR (FLU A&B, COVID) ARPGX2
Influenza A by PCR: NEGATIVE
Influenza B by PCR: NEGATIVE
SARS Coronavirus 2 by RT PCR: NEGATIVE

## 2020-09-19 LAB — LACTIC ACID, PLASMA: Lactic Acid, Venous: 0.9 mmol/L (ref 0.5–1.9)

## 2020-09-19 LAB — BRAIN NATRIURETIC PEPTIDE: B Natriuretic Peptide: 480.4 pg/mL — ABNORMAL HIGH (ref 0.0–100.0)

## 2020-09-19 LAB — TROPONIN I (HIGH SENSITIVITY)
Troponin I (High Sensitivity): 24 ng/L — ABNORMAL HIGH (ref <18)
Troponin I (High Sensitivity): 25 ng/L — ABNORMAL HIGH (ref <18)

## 2020-09-19 MED ORDER — ATORVASTATIN CALCIUM 10 MG PO TABS
20.0000 mg | ORAL_TABLET | Freq: Every day | ORAL | Status: DC
  Administered 2020-09-19: 20 mg via ORAL
  Filled 2020-09-19: qty 2

## 2020-09-19 MED ORDER — MIDODRINE HCL 5 MG PO TABS
5.0000 mg | ORAL_TABLET | Freq: Once | ORAL | Status: DC
  Filled 2020-09-19: qty 1

## 2020-09-19 MED ORDER — CHLORHEXIDINE GLUCONATE CLOTH 2 % EX PADS: 6.0000 | MEDICATED_PAD | Freq: Every day | CUTANEOUS | Status: DC

## 2020-09-19 MED ORDER — MYCOPHENOLATE MOFETIL 250 MG PO CAPS
250.0000 mg | ORAL_CAPSULE | Freq: Two times a day (BID) | ORAL | Status: DC
  Administered 2020-09-19: 250 mg via ORAL
  Filled 2020-09-19 (×2): qty 1

## 2020-09-19 MED ORDER — LIDOCAINE-PRILOCAINE 2.5-2.5 % EX CREA: 1.0000 "application " | TOPICAL_CREAM | CUTANEOUS | Status: DC | PRN

## 2020-09-19 MED ORDER — VITAMIN D 25 MCG (1000 UNIT) PO TABS
5000.0000 [IU] | ORAL_TABLET | Freq: Every day | ORAL | Status: DC
  Administered 2020-09-19: 5000 [IU] via ORAL
  Filled 2020-09-19 (×2): qty 5

## 2020-09-19 MED ORDER — MIDODRINE HCL 5 MG PO TABS: 5.0000 mg | ORAL_TABLET | Freq: Every day | ORAL | Status: DC | PRN

## 2020-09-19 MED ORDER — ESCITALOPRAM OXALATE 10 MG PO TABS
10.0000 mg | ORAL_TABLET | Freq: Every day | ORAL | Status: DC
  Administered 2020-09-19: 10 mg via ORAL
  Filled 2020-09-19: qty 1

## 2020-09-19 MED ORDER — ASPIRIN EC 81 MG PO TBEC
81.0000 mg | DELAYED_RELEASE_TABLET | Freq: Every day | ORAL | Status: DC
  Administered 2020-09-19: 81 mg via ORAL
  Filled 2020-09-19: qty 1

## 2020-09-19 MED ORDER — PANTOPRAZOLE SODIUM 40 MG PO TBEC
40.0000 mg | DELAYED_RELEASE_TABLET | Freq: Two times a day (BID) | ORAL | Status: DC
  Administered 2020-09-19: 40 mg via ORAL
  Filled 2020-09-19: qty 1

## 2020-09-19 MED ORDER — MIRTAZAPINE 15 MG PO TABS
15.0000 mg | ORAL_TABLET | Freq: Every day | ORAL | Status: DC
  Filled 2020-09-19: qty 1

## 2020-09-19 MED ORDER — DILTIAZEM HCL ER COATED BEADS 120 MG PO CP24
120.0000 mg | ORAL_CAPSULE | Freq: Every day | ORAL | Status: DC
  Filled 2020-09-19 (×2): qty 1

## 2020-09-19 MED ORDER — CALCIUM CARBONATE 1250 (500 CA) MG PO TABS
1.0000 | ORAL_TABLET | Freq: Two times a day (BID) | ORAL | Status: DC
  Administered 2020-09-19: 500 mg via ORAL
  Filled 2020-09-19: qty 1

## 2020-09-19 MED ORDER — LIDOCAINE HCL (PF) 1 % IJ SOLN: 5.0000 mL | INTRAMUSCULAR | Status: DC | PRN

## 2020-09-19 MED ORDER — METOCLOPRAMIDE HCL 10 MG PO TABS: 5.0000 mg | ORAL_TABLET | Freq: Every day | ORAL | Status: DC

## 2020-09-19 MED ORDER — TACROLIMUS 1 MG PO CAPS
5.0000 mg | ORAL_CAPSULE | Freq: Every day | ORAL | Status: DC
  Filled 2020-09-19: qty 5

## 2020-09-19 MED ORDER — PENTAFLUOROPROP-TETRAFLUOROETH EX AERO: 1.0000 "application " | INHALATION_SPRAY | CUTANEOUS | Status: DC | PRN

## 2020-09-19 MED ORDER — TACROLIMUS 1 MG PO CAPS
6.0000 mg | ORAL_CAPSULE | Freq: Every day | ORAL | Status: DC
  Administered 2020-09-19: 6 mg via ORAL
  Filled 2020-09-19: qty 6

## 2020-09-19 MED ORDER — FUROSEMIDE 10 MG/ML IJ SOLN
80.0000 mg | Freq: Two times a day (BID) | INTRAMUSCULAR | Status: DC
  Administered 2020-09-19: 80 mg via INTRAVENOUS
  Filled 2020-09-19: qty 8

## 2020-09-19 MED ORDER — TRAZODONE HCL 50 MG PO TABS: 25.0000 mg | ORAL_TABLET | Freq: Every evening | ORAL | Status: DC | PRN

## 2020-09-19 MED ORDER — SODIUM CHLORIDE 0.9 % IV SOLN: 100.0000 mL | INTRAVENOUS | Status: DC | PRN

## 2020-09-19 NOTE — ED Triage Notes (Signed)
Patient arrived with EMS from home reports worsening SOB with chest congestion onset last week with dry cough , no fever or chills , hemodialysis q MonWedFri .

## 2020-09-19 NOTE — ED Provider Notes (Signed)
7:33 AM Care assumed from Dr. Betsey Holiday.  At time of transfer care, patient is awaiting transfer and admission to [facility] for further management of fluid overload versus heart transplant rejection causing shortness of breath and increased oxygen requirement.  At time of transfer care, patient is awaiting a dialysis this morning prior to transfer to [facility].  2:49 PM Patient was put on the list for dialysis today however I was just made aware that the patient has a bed available at [facility].  I went assessed the patient and rather than get emergent dialysis right now, he would rather be transferred and get admitted to his transplant team where he can continue his management.  He is resting comfortably and denies any worsening of symptoms.  His vital signs showed slight tachycardia but otherwise he was putting oxygen saturations on nasal cannula oxygen.  Based on his well appearance and now having a bed at his transplant facility, I worry that if he waited longer here to get dialysis at this time he may lose his admission tonight.  Patient understands this risk and agrees with transfer to [facility] for admission for further management without getting dialysis today.   Clinical Impression: 1. Hypervolemia, unspecified hypervolemia type     Disposition: Transfer to [facility] for further management of fluid overload either due to heart transplant rejection versus fluid overload from renal etiology  This note was prepared with assistance of Dragon voice recognition software. Occasional wrong-word or sound-a-like substitutions may have occurred due to the inherent limitations of voice recognition software.     Gusta Marksberry, Gwenyth Allegra, MD 09/19/20 1451

## 2020-09-19 NOTE — ED Notes (Signed)
Pt unable to give urine at this time. Urinal at bedside, pt aware urine is needed.

## 2020-09-19 NOTE — ED Notes (Signed)
ACCEPTING FACILITY : [facility] 3703  [phone]) [phone] option 2

## 2020-09-19 NOTE — Consult Note (Signed)
Reason for Consult: To manage dialysis and dialysis related needs  Referring Physician: Dr Sherry Ruffing  Rickey Erickson is an 64 y.o. male.   HPI: Pt is a 61M with a PMH sig for systolic CHF s/p LVAD and eventually heart transplant 12/2438 complicated by prolonged RF requiring trach (now decannulated), lung/diaphragm injury, multi-microbial PNA, bacteremia, trach and PEG placement, PEG perforation s/p ex-lap x 2 with GJ placement, AKI which has been dialysis dependent.  He was finally discharged from [facility] from the above in late October.  He has been receiving dialysis at [facility].    He presents to ED today with increasing SOB.  Has been building up for the past week or so.  Awake from 12-3 at night and O2 bumped up.  Sister is with him and says that he has not been eating very much at all the past week and a half.  Has a PEG which hasn't been used since his discharge from the hospital.  CXR here with bilateral pleural effusions.   COVID negative.  WBC ct 2.9    No f/c, n/v, CP, LE edema.  His last dialysis rx was 11/26 and he left 0.6 kg under his EDW.  Making more urine over the past 2-3 weeks. Transplant team at [facility] in contact with EDP---> hopeful HD will improve SOB.    Dialyzes at [facility] MWF 4 hrs EDW 62.5 kg 3 K/ 2.5 Ca F180 BFR 400/ DFR 800 No heparin venofer 100 x 10  Mircera 75 mcg q 4 weeks, last given 11/17   PMH/ PSH Chronic systolic CHF LVAD explant and heart transplant 02/2020, [facility] Trach (decannulated) Bowel per d/t PEG x 2 s/p ex-lap MDR Pseudomonas bacteremia/ pneumonia debility  Social History:  reports that he has never smoked. He has never used smokeless tobacco. He reports that he does not drink alcohol and does not use drugs.  Allergies:  Allergies  Allergen Reactions  . Scopolamine Other (See Comments)    Other reaction(s): Hallucinations Induces severe delirium   . Lorazepam Other (See Comments)    Other reaction(s): Hallucinations Severe somnolence/delirium      Medications:  Scheduled: . aspirin EC  81 mg Oral Daily  . atorvastatin  20 mg Oral Daily  . calcium carbonate  1 tablet Oral BID WC  . Chlorhexidine Gluconate Cloth  6 each Topical Q0600  . cholecalciferol  5,000 Units Oral Daily  . diltiazem  120 mg Oral Daily  . escitalopram  10 mg Oral Daily  . furosemide  80 mg Intravenous BID  . metoCLOPramide  5 mg Oral QHS  . mirtazapine  15 mg Oral QHS  . mycophenolate  250 mg Oral BID  . pantoprazole  40 mg Oral BID  . tacrolimus  5 mg Oral QHS  . tacrolimus  6 mg Oral Daily     Results for orders placed or performed during the hospital encounter of 09/19/20 (from the past 48 hour(s))  Resp Panel by RT-PCR (Flu A&B, Covid) Nasopharyngeal Swab     Status: None   Collection Time: 09/19/20 12:55 AM   Specimen: Nasopharyngeal Swab; Nasopharyngeal(NP) swabs in vial transport medium  Result Value Ref Range   SARS Coronavirus 2 by RT PCR NEGATIVE NEGATIVE    Comment: (NOTE) SARS-CoV-2 target nucleic acids are NOT DETECTED.  The SARS-CoV-2 RNA is generally detectable in upper respiratory specimens during the acute phase of infection. The lowest concentration of SARS-CoV-2 viral copies this assay can detect is 138 copies/mL. A negative result  does not preclude SARS-Cov-2 infection and should not be used as the sole basis for treatment or other patient management decisions. A negative result may occur with  improper specimen collection/handling, submission of specimen other than nasopharyngeal swab, presence of viral mutation(s) within the areas targeted by this assay, and inadequate number of viral copies(<138 copies/mL). A negative result must be combined with clinical observations, patient history, and epidemiological information. The expected result is Negative.  Fact Sheet for Patients:  EntrepreneurPulse.com.au  Fact Sheet for Healthcare Providers:  IncredibleEmployment.be  This test is no  t yet approved or cleared by the [place] FDA and  has been authorized for detection and/or diagnosis of SARS-CoV-2 by FDA under an Emergency Use Authorization (EUA). This EUA will remain  in effect (meaning this test can be used) for the duration of the COVID-19 declaration under Section 564(b)(1) of the Act, 21 U.S.C.section 360bbb-3(b)(1), unless the authorization is terminated  or revoked sooner.       Influenza A by PCR NEGATIVE NEGATIVE   Influenza B by PCR NEGATIVE NEGATIVE    Comment: (NOTE) The Xpert Xpress SARS-CoV-2/FLU/RSV plus assay is intended as an aid in the diagnosis of influenza from Nasopharyngeal swab specimens and should not be used as a sole basis for treatment. Nasal washings and aspirates are unacceptable for Xpert Xpress SARS-CoV-2/FLU/RSV testing.  Fact Sheet for Patients: EntrepreneurPulse.com.au  Fact Sheet for Healthcare Providers: IncredibleEmployment.be  This test is not yet approved or cleared by the [place] FDA and has been authorized for detection and/or diagnosis of SARS-CoV-2 by FDA under an Emergency Use Authorization (EUA). This EUA will remain in effect (meaning this test can be used) for the duration of the COVID-19 declaration under Section 564(b)(1) of the Act, 21 U.S.C. section 360bbb-3(b)(1), unless the authorization is terminated or revoked.  Performed at [facility], [place] [address], , [place] [address]   CBC with Differential/Platelet     Status: Abnormal   Collection Time: 09/19/20  1:49 AM  Result Value Ref Range   WBC 2.9 (L) 4.0 - 10.5 K/uL   RBC 3.09 (L) 4.22 - 5.81 MIL/uL   Hemoglobin 8.5 (L) 13.0 - 17.0 g/dL   HCT 30.2 (L) 39 - 52 %   MCV 97.7 80.0 - 100.0 fL   MCH 27.5 26.0 - 34.0 pg   MCHC 28.1 (L) 30.0 - 36.0 g/dL   RDW 16.6 (H) 11.5 - 15.5 %   Platelets PLATELET CLUMPS NOTED ON SMEAR, UNABLE TO ESTIMATE 150 - 400 K/uL    Comment: REPEATED TO  VERIFY PLATELET COUNT CONFIRMED BY SMEAR Immature Platelet Fraction may be clinically indicated, consider ordering this additional test [id number]    nRBC 0.0 0.0 - 0.2 %   Neutrophils Relative % 66 %   Neutro Abs 1.9 1.7 - 7.7 K/uL   Lymphocytes Relative 7 %   Lymphs Abs 0.2 (L) 0.7 - 4.0 K/uL   Monocytes Relative 24 %   Monocytes Absolute 0.7 0.1 - 1.0 K/uL   Eosinophils Relative 2 %   Eosinophils Absolute 0.1 0.0 - 0.5 K/uL   Basophils Relative 0 %   Basophils Absolute 0.0 0.0 - 0.1 K/uL   Immature Granulocytes 1 %   Abs Immature Granulocytes 0.02 0.00 - 0.07 K/uL    Comment: Performed at [facility] [address] [place], [place] [address]  Comprehensive metabolic panel     Status: Abnormal   Collection Time: 09/19/20  1:49 AM  Result Value  Ref Range   Sodium 140 135 - 145 mmol/L   Potassium 4.1 3.5 - 5.1 mmol/L   Chloride 98 98 - 111 mmol/L   CO2 31 22 - 32 mmol/L   Glucose, Bld 104 (H) 70 - 99 mg/dL    Comment: Glucose reference range applies only to samples taken after fasting for at least 8 hours.   BUN 5 (L) 8 - 23 mg/dL   Creatinine, Ser 2.27 (H) 0.61 - 1.24 mg/dL   Calcium 9.0 8.9 - 10.3 mg/dL   Total Protein 5.7 (L) 6.5 - 8.1 g/dL   Albumin 2.8 (L) 3.5 - 5.0 g/dL   AST 14 (L) 15 - 41 U/L   ALT 9 0 - 44 U/L   Alkaline Phosphatase 103 38 - 126 U/L   Total Bilirubin 0.5 0.3 - 1.2 mg/dL   GFR, Estimated 32 (L) >60 mL/min    Comment: (NOTE) Calculated using the CKD-EPI Creatinine Equation (2021)    Anion gap 11 5 - 15    Comment: Performed at [facility], [place] [address] [place], [place] [address]  Troponin I (High Sensitivity)     Status: Abnormal   Collection Time: 09/19/20  1:49 AM  Result Value Ref Range   Troponin I (High Sensitivity) 24 (H) <18 ng/L    Comment: (NOTE) Elevated high sensitivity troponin I (hsTnI) values and significant  changes across serial measurements may suggest ACS but many other  chronic and acute conditions are  known to elevate hsTnI results.  Refer to the "Links" section for chest pain algorithms and additional  guidance. Performed at [facility], [place] [address], [place], [place] [address]   Brain natriuretic peptide     Status: Abnormal   Collection Time: 09/19/20  1:49 AM  Result Value Ref Range   B Natriuretic Peptide 480.4 (H) 0.0 - 100.0 pg/mL    Comment: Performed at [facility] [address] [place], [place] [address]  Lactic acid, plasma     Status: None   Collection Time: 09/19/20  1:49 AM  Result Value Ref Range   Lactic Acid, Venous 0.9 0.5 - 1.9 mmol/L    Comment: Performed at [facility] [address], [place], [place] [address]  Troponin I (High Sensitivity)     Status: Abnormal   Collection Time: 09/19/20  4:05 AM  Result Value Ref Range   Troponin I (High Sensitivity) 25 (H) <18 ng/L    Comment: (NOTE) Elevated high sensitivity troponin I (hsTnI) values and significant  changes across serial measurements may suggest ACS but many other  chronic and acute conditions are known to elevate hsTnI results.  Refer to the "Links" section for chest pain algorithms and additional  guidance. Performed at [facility], [place] [address], [place], [place] [address]     DG Chest Port 1 View  Result Date: 09/19/2020 CLINICAL DATA:  Shortness of breath EXAM: PORTABLE CHEST 1 VIEW COMPARISON:  None. FINDINGS: There is a well-positioned tunneled dialysis catheter on the left. The patient is status post prior median sternotomy. The heart size is enlarged. There are small to moderate-sized bilateral pleural effusions. There is bibasilar airspace disease. There is no pneumothorax. No acute osseous abnormality. IMPRESSION: 1. Small to moderate-sized bilateral pleural effusions with bibasilar airspace disease. 2. Cardiomegaly. 3. Well-positioned tunneled dialysis catheter on the left. Electronically Signed   By: Constance Holster M.D.   On: 09/19/2020 01:30     ROS:  All other systems reviewed and are negative except as per HPI Blood pressure 131/80, pulse (!) 107, temperature 97.7 F (36.5 C), temperature source Oral, resp. rate 19, SpO2 100 %. .  GEN frail, NAD, lying in bed HEENT EOMI PERRL  NECK trach decannulated, covered with gauze PULM muffled bases CV RRR ABD soft, PEG in place, healing scars EXT no LE edema NEURO AAO x 3 nonfocal SKIN no rashes ACCESS L IJ TDC  Assessment/Plan: 1  SOB/ pleural effusions: in contact with [facility] transplant center.  Has lost weight.  Will UF today for hopeful improvement, rest per primary 2 ESRD: MWF, last rx was Friday.  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  Heart transplant: on prograf/ cellcept  7.  Weight loss- not eating, still has PEG, ? If he needs TF again (decision for transplant team) 8. Dispo: pending   Madelon Lips 09/19/2020, 12:02 PM

## 2020-09-19 NOTE — ED Notes (Signed)
Pt still no urge to urinate.

## 2020-09-19 NOTE — ED Provider Notes (Signed)
[place] EMERGENCY DEPARTMENT Provider Note   CSN: [id number] Arrival date & time: 09/19/20  0049     History Chief Complaint  Patient presents with  . Shortness of Breath    Rickey Erickson is a 64 y.o. male.  Patient presents to the emergency department for evaluation of shortness of breath.  Patient reports that he has been having shortness of breath, mostly in the middle of the night, for the last week or so.  He does report an increased amount of cough, but has not produced any sputum.  He had heart transplant in May of this year at [facility].  He reports pneumonia after transplant and that he has some persistent fluid in his left lung that "they have been watching".  He has recently had renal failure and has been on dialysis for four or 5 weeks.  He has not missed any sessions.  Patient not experiencing any chest pain.  He is chronically on oxygen at home, EMS report oxygen saturations of 90 to 92% upon their arrival, improved with increasing nasal cannula oxygen.  At arrival patient reports that his breathing has improved with the increased oxygen.        History reviewed. No pertinent past medical history.  There are no problems to display for this patient.   History reviewed. No pertinent surgical history.     No family history on file.  Social History   Tobacco Use  . Smoking status: Never Smoker  . Smokeless tobacco: Never Used  Substance Use Topics  . Alcohol use: Never  . Drug use: Never    Home Medications Prior to Admission medications   Medication Sig Start Date End Date Taking? Authorizing Provider  aspirin 81 MG EC tablet Take 1 tablet by mouth daily. 08/27/20 08/27/21 Yes [provider]  atorvastatin (LIPITOR) 20 MG tablet Take 20 mg by mouth daily. 09/10/20  Yes [provider]  calcium carbonate (OSCAL) 1500 (600 Ca) MG TABS tablet Take 1 tablet by mouth 2 (two) times daily. 08/18/20  Yes [provider]  Cholecalciferol 125 MCG (5000 UT) TABS Take 1 tablet by mouth daily. 08/18/20  Yes [provider]  diltiazem (CARDIZEM SR) 120 MG 12 hr capsule Take 120 mg by mouth daily. 09/06/20  Yes [provider]  escitalopram (LEXAPRO) 10 MG tablet Take 1 tablet by mouth daily. 09/02/20  Yes [provider]  metoCLOPramide (REGLAN) 5 MG tablet Take 5 mg by mouth at bedtime. 09/02/20  Yes [provider]  midodrine (PROAMATINE) 5 MG tablet Take 1 tablet by mouth daily as needed. Only give on HD days 09/08/20  Yes [provider]  mirtazapine (REMERON) 15 MG tablet Take 15 mg by mouth at bedtime. 09/10/20  Yes [provider]  mycophenolate (CELLCEPT) 250 MG capsule Take 1 capsule by mouth 2 (two) times daily. 09/04/20  Yes [provider]  pantoprazole (PROTONIX) 40 MG tablet Take 40 mg by mouth 2 (two) times daily. 09/02/20  Yes [provider]  tacrolimus (PROGRAF) 1 MG capsule Take by mouth See admin instructions. Take 6 mg in the morning and 5 mg in the evening 09/04/20  Yes [provider]  torsemide (DEMADEX) 20 MG tablet Take 60 mg by mouth daily. Take only on non-HD days (tues, thurs, sat, sun) 09/14/20  Yes [provider]  traZODone (DESYREL) 50 MG tablet Take 25 mg by mouth at bedtime as needed for sleep. 09/15/20  Yes [provider]    Allergies    Scopolamine and Lorazepam  Review of Systems   Review of Systems  Respiratory: Positive for cough and shortness of breath.   All other systems reviewed and are negative.   Physical Exam Updated Vital Signs BP 121/76 (BP Location: Right Arm)   Pulse 90   Resp 15   SpO2 100%   Physical Exam Vitals and nursing note reviewed.  Constitutional:      General: He is not in acute distress.    Appearance: Normal appearance. He is well-developed.  HENT:     Head: Normocephalic and atraumatic.     Right Ear: Hearing normal.      Left Ear: Hearing normal.     Nose: Nose normal.  Eyes:     Conjunctiva/sclera: Conjunctivae normal.     Pupils: Pupils are equal, round, and reactive to light.  Cardiovascular:     Rate and Rhythm: Regular rhythm.     Heart sounds: S1 normal and S2 normal. No murmur heard.  No friction rub. No gallop.   Pulmonary:     Effort: Pulmonary effort is normal. Tachypnea present. No respiratory distress.     Breath sounds: Examination of the left-lower field reveals decreased breath sounds. Decreased breath sounds present.  Chest:     Chest wall: No tenderness.  Abdominal:     General: Bowel sounds are normal.     Palpations: Abdomen is soft.     Tenderness: There is no abdominal tenderness. There is no guarding or rebound. Negative signs include Murphy's sign and McBurney's sign.     Hernia: No hernia is present.  Musculoskeletal:        General: Normal range of motion.     Cervical back: Normal range of motion and neck supple.  Skin:    General: Skin is warm and dry.     Findings: No rash.  Neurological:     Mental Status: He is alert and oriented to person, place, and time.     GCS: GCS eye subscore is 4. GCS verbal subscore is 5. GCS motor subscore is 6.     Cranial Nerves: No cranial nerve deficit.     Sensory: No sensory deficit.     Coordination: Coordination normal.  Psychiatric:        Speech: Speech normal.        Behavior: Behavior normal.        Thought Content: Thought content normal.     ED Results / Procedures / Treatments   Labs (all labs ordered are listed, but only abnormal results are displayed) Labs Reviewed  CBC WITH DIFFERENTIAL/PLATELET - Abnormal; Notable for the following components:      Result Value   WBC 2.9 (*)    RBC 3.09 (*)    Hemoglobin 8.5 (*)    HCT 30.2 (*)    MCHC 28.1 (*)    RDW 16.6 (*)    Lymphs Abs 0.2 (*)    All other components within normal limits  COMPREHENSIVE METABOLIC PANEL - Abnormal; Notable for the following components:    Glucose, Bld 104 (*)    BUN 5 (*)    Creatinine, Ser 2.27 (*)    Total Protein 5.7 (*)    Albumin 2.8 (*)    AST 14 (*)    GFR, Estimated 32 (*)    All other components within normal limits  BRAIN NATRIURETIC PEPTIDE - Abnormal; Notable for the following components:   B Natriuretic Peptide  480.4 (*)    All other components within normal limits  TROPONIN I (HIGH SENSITIVITY) - Abnormal; Notable for the following components:   Troponin I (High Sensitivity) 24 (*)    All other components within normal limits  TROPONIN I (HIGH SENSITIVITY) - Abnormal; Notable for the following components:   Troponin I (High Sensitivity) 25 (*)    All other components within normal limits  RESP PANEL BY RT-PCR (FLU A&B, COVID) ARPGX2  LACTIC ACID, PLASMA  URINALYSIS, ROUTINE W REFLEX MICROSCOPIC  TACROLIMUS LEVEL    EKG EKG Interpretation  Date/Time:  Saturday September 19 2020 00:59:53 EST Ventricular Rate:  111 PR Interval:    QRS Duration: 104 QT Interval:  366 QTC Calculation: 498 R Axis:   86 Text Interpretation: Sinus tachycardia Consider right ventricular hypertrophy Nonspecific T abnormalities, anterior leads Borderline prolonged QT interval Confirmed by Orpah Greek [phone]) on 09/19/2020 1:17:36 AM   Radiology DG Chest Port 1 View  Result Date: 09/19/2020 CLINICAL DATA:  Shortness of breath EXAM: PORTABLE CHEST 1 VIEW COMPARISON:  None. FINDINGS: There is a well-positioned tunneled dialysis catheter on the left. The patient is status post prior median sternotomy. The heart size is enlarged. There are small to moderate-sized bilateral pleural effusions. There is bibasilar airspace disease. There is no pneumothorax. No acute osseous abnormality. IMPRESSION: 1. Small to moderate-sized bilateral pleural effusions with bibasilar airspace disease. 2. Cardiomegaly. 3. Well-positioned tunneled dialysis catheter on the left. Electronically Signed   By: Constance Holster M.D.   On:  09/19/2020 01:30    Procedures Procedures (including critical care time)  Medications Ordered in ED Medications  aspirin EC tablet 81 mg (has no administration in time range)  atorvastatin (LIPITOR) tablet 20 mg (has no administration in time range)  calcium carbonate (OS-CAL - dosed in mg of elemental calcium) tablet 500 mg of elemental calcium (has no administration in time range)  cholecalciferol (VITAMIN D3) tablet 5,000 Units (has no administration in time range)  diltiazem (CARDIZEM CD) 24 hr capsule 120 mg (has no administration in time range)  escitalopram (LEXAPRO) tablet 10 mg (has no administration in time range)  metoCLOPramide (REGLAN) tablet 5 mg (has no administration in time range)  midodrine (PROAMATINE) tablet 5 mg (has no administration in time range)  mirtazapine (REMERON) tablet 15 mg (has no administration in time range)  mycophenolate (CELLCEPT) capsule 250 mg (has no administration in time range)  pantoprazole (PROTONIX) EC tablet 40 mg (has no administration in time range)  tacrolimus (PROGRAF) capsule 6 mg (has no administration in time range)  traZODone (DESYREL) tablet 25 mg (has no administration in time range)  tacrolimus (PROGRAF) capsule 5 mg (has no administration in time range)  furosemide (LASIX) injection 80 mg (has no administration in time range)  Chlorhexidine Gluconate Cloth 2 % PADS 6 each (has no administration in time range)  pentafluoroprop-tetrafluoroeth (GEBAUERS) aerosol 1 application (has no administration in time range)  lidocaine (PF) (XYLOCAINE) 1 % injection 5 mL (has no administration in time range)  lidocaine-prilocaine (EMLA) cream 1 application (has no administration in time range)  0.9 %  sodium chloride infusion (has no administration in time range)    ED Course  I have reviewed the triage vital signs and the nursing notes.  Pertinent labs & imaging results that were available during my care of the patient were reviewed by me  and considered in my medical decision making (see chart for details).    MDM Rules/Calculators/A&P  Patient presents to the emergency department for evaluation of shortness of breath.  Patient has been experiencing increased shortness of breath, especially at night for approximately 1 week.  It seems like he is experiencing some paroxysmal nocturnal dyspnea, as he reports waking up very short of breath.  His oxygen saturations were slightly diminished and he was placed on increased oxygen with improvement of his breathing.  He is not experiencing any chest pain.  Patient's work-up reveals mildly elevated troponins, plateaued at 25.  BNP is 480.  I do not have a baseline.  Chest x-ray does show evidence of volume overload with bilateral pleural effusions.  I am concerned in this recent heart transplant patient that he is having complications of his transplant.  I discussed the patient's care with Dr. Zebedee Iba, at [facility] where he received his transplant.  He has been accepted for transfer but there are no beds currently.  They are hopeful they will have a bed later today.  It is recommended that patient receive dialysis here this morning and also draw a tacrolimus trough level at 9 AM.  Discussed with Dr. Hollie Salk, on-call for nephrology, will dialyze patient.  Patient will be maintained here in the emergency department pending transfer.  CRITICAL CARE Performed by: Orpah Greek   Total critical care time: 35 minutes  Critical care time was exclusive of separately billable procedures and treating other patients.  Critical care was necessary to treat or prevent imminent or life-threatening deterioration.  Critical care was time spent personally by me on the following activities: development of treatment plan with patient and/or surrogate as well as nursing, discussions with consultants, evaluation of patient's response to treatment, examination of patient,  obtaining history from patient or surrogate, ordering and performing treatments and interventions, ordering and review of laboratory studies, ordering and review of radiographic studies, pulse oximetry and re-evaluation of patient's condition.  Final Clinical Impression(s) / ED Diagnoses Final diagnoses:  Hypervolemia, unspecified hypervolemia type    Rx / DC Orders ED Discharge Orders    None       Orpah Greek, MD 09/19/20 [phone]

## 2020-09-20 DIAGNOSIS — E785 Hyperlipidemia, unspecified: Secondary | ICD-10-CM | POA: Diagnosis present

## 2020-09-21 DIAGNOSIS — F329 Major depressive disorder, single episode, unspecified: Secondary | ICD-10-CM | POA: Insufficient documentation

## 2020-09-21 DIAGNOSIS — Z7901 Long term (current) use of anticoagulants: Principal | ICD-10-CM

## 2020-09-21 DIAGNOSIS — E785 Hyperlipidemia, unspecified: Principal | ICD-10-CM

## 2020-09-21 DIAGNOSIS — D631 Anemia in chronic kidney disease: Principal | ICD-10-CM

## 2020-09-21 DIAGNOSIS — N2581 Secondary hyperparathyroidism of renal origin: Principal | ICD-10-CM

## 2020-09-21 DIAGNOSIS — Z992 Dependence on renal dialysis: Principal | ICD-10-CM

## 2020-09-21 DIAGNOSIS — J188 Other pneumonia, unspecified organism: Principal | ICD-10-CM

## 2020-09-21 DIAGNOSIS — E43 Unspecified severe protein-calorie malnutrition: Principal | ICD-10-CM

## 2020-09-21 DIAGNOSIS — D500 Iron deficiency anemia secondary to blood loss (chronic): Principal | ICD-10-CM

## 2020-09-21 DIAGNOSIS — R627 Adult failure to thrive: Principal | ICD-10-CM

## 2020-09-21 DIAGNOSIS — G2581 Restless legs syndrome: Principal | ICD-10-CM

## 2020-09-21 DIAGNOSIS — J9811 Atelectasis: Principal | ICD-10-CM

## 2020-09-21 DIAGNOSIS — R630 Anorexia: Principal | ICD-10-CM

## 2020-09-21 DIAGNOSIS — H9190 Unspecified hearing loss, unspecified ear: Principal | ICD-10-CM

## 2020-09-21 DIAGNOSIS — I255 Ischemic cardiomyopathy: Principal | ICD-10-CM

## 2020-09-21 DIAGNOSIS — N186 End stage renal disease: Principal | ICD-10-CM

## 2020-09-21 DIAGNOSIS — I132 Hypertensive heart and chronic kidney disease with heart failure and with stage 5 chronic kidney disease, or end stage renal disease: Principal | ICD-10-CM

## 2020-09-21 DIAGNOSIS — R6881 Early satiety: Principal | ICD-10-CM

## 2020-09-21 DIAGNOSIS — I2510 Atherosclerotic heart disease of native coronary artery without angina pectoris: Principal | ICD-10-CM

## 2020-09-21 DIAGNOSIS — Z7982 Long term (current) use of aspirin: Principal | ICD-10-CM

## 2020-09-21 DIAGNOSIS — B370 Candidal stomatitis: Principal | ICD-10-CM

## 2020-09-21 DIAGNOSIS — Z6821 Body mass index (BMI) 21.0-21.9, adult: Principal | ICD-10-CM

## 2020-09-21 DIAGNOSIS — D689 Coagulation defect, unspecified: Principal | ICD-10-CM

## 2020-09-21 DIAGNOSIS — A319 Mycobacterial infection, unspecified: Principal | ICD-10-CM

## 2020-09-21 DIAGNOSIS — N179 Acute kidney failure, unspecified: Principal | ICD-10-CM

## 2020-09-21 DIAGNOSIS — Z941 Heart transplant status: Principal | ICD-10-CM

## 2020-09-21 DIAGNOSIS — K529 Noninfective gastroenteritis and colitis, unspecified: Principal | ICD-10-CM

## 2020-09-21 DIAGNOSIS — A4189 Other specified sepsis: Principal | ICD-10-CM

## 2020-09-21 DIAGNOSIS — T827XXA Infection and inflammatory reaction due to other cardiac and vascular devices, implants and grafts, initial encounter: Principal | ICD-10-CM

## 2020-09-21 DIAGNOSIS — J9602 Acute respiratory failure with hypercapnia: Principal | ICD-10-CM

## 2020-09-21 DIAGNOSIS — I5022 Chronic systolic (congestive) heart failure: Principal | ICD-10-CM

## 2020-09-21 DIAGNOSIS — Z20822 Contact with and (suspected) exposure to covid-19: Principal | ICD-10-CM

## 2020-09-21 DIAGNOSIS — D849 Immunodeficiency, unspecified: Principal | ICD-10-CM

## 2020-09-21 DIAGNOSIS — J9601 Acute respiratory failure with hypoxia: Principal | ICD-10-CM

## 2020-09-21 DIAGNOSIS — D61818 Other pancytopenia: Principal | ICD-10-CM

## 2020-09-22 DIAGNOSIS — I2510 Atherosclerotic heart disease of native coronary artery without angina pectoris: Principal | ICD-10-CM

## 2020-09-22 DIAGNOSIS — R630 Anorexia: Principal | ICD-10-CM

## 2020-09-22 DIAGNOSIS — Z992 Dependence on renal dialysis: Principal | ICD-10-CM

## 2020-09-22 DIAGNOSIS — R6881 Early satiety: Principal | ICD-10-CM

## 2020-09-22 DIAGNOSIS — Z6821 Body mass index (BMI) 21.0-21.9, adult: Principal | ICD-10-CM

## 2020-09-22 DIAGNOSIS — Z941 Heart transplant status: Principal | ICD-10-CM

## 2020-09-22 DIAGNOSIS — N179 Acute kidney failure, unspecified: Principal | ICD-10-CM

## 2020-09-22 DIAGNOSIS — N186 End stage renal disease: Principal | ICD-10-CM

## 2020-09-22 DIAGNOSIS — D500 Iron deficiency anemia secondary to blood loss (chronic): Principal | ICD-10-CM

## 2020-09-22 DIAGNOSIS — D631 Anemia in chronic kidney disease: Principal | ICD-10-CM

## 2020-09-22 DIAGNOSIS — E785 Hyperlipidemia, unspecified: Principal | ICD-10-CM

## 2020-09-22 DIAGNOSIS — J9811 Atelectasis: Principal | ICD-10-CM

## 2020-09-22 DIAGNOSIS — J9602 Acute respiratory failure with hypercapnia: Principal | ICD-10-CM

## 2020-09-22 DIAGNOSIS — D61818 Other pancytopenia: Principal | ICD-10-CM

## 2020-09-22 DIAGNOSIS — D849 Immunodeficiency, unspecified: Principal | ICD-10-CM

## 2020-09-22 DIAGNOSIS — F329 Major depressive disorder, single episode, unspecified: Principal | ICD-10-CM

## 2020-09-22 DIAGNOSIS — B370 Candidal stomatitis: Principal | ICD-10-CM

## 2020-09-22 DIAGNOSIS — K529 Noninfective gastroenteritis and colitis, unspecified: Principal | ICD-10-CM

## 2020-09-22 DIAGNOSIS — D689 Coagulation defect, unspecified: Principal | ICD-10-CM

## 2020-09-22 DIAGNOSIS — I255 Ischemic cardiomyopathy: Principal | ICD-10-CM

## 2020-09-22 DIAGNOSIS — Z7982 Long term (current) use of aspirin: Principal | ICD-10-CM

## 2020-09-22 DIAGNOSIS — E43 Unspecified severe protein-calorie malnutrition: Principal | ICD-10-CM

## 2020-09-22 DIAGNOSIS — H9190 Unspecified hearing loss, unspecified ear: Principal | ICD-10-CM

## 2020-09-22 DIAGNOSIS — N2581 Secondary hyperparathyroidism of renal origin: Principal | ICD-10-CM

## 2020-09-22 DIAGNOSIS — Z20822 Contact with and (suspected) exposure to covid-19: Principal | ICD-10-CM

## 2020-09-22 DIAGNOSIS — I132 Hypertensive heart and chronic kidney disease with heart failure and with stage 5 chronic kidney disease, or end stage renal disease: Principal | ICD-10-CM

## 2020-09-22 DIAGNOSIS — J188 Other pneumonia, unspecified organism: Principal | ICD-10-CM

## 2020-09-22 DIAGNOSIS — A4189 Other specified sepsis: Principal | ICD-10-CM

## 2020-09-22 DIAGNOSIS — I5022 Chronic systolic (congestive) heart failure: Principal | ICD-10-CM

## 2020-09-22 DIAGNOSIS — T827XXA Infection and inflammatory reaction due to other cardiac and vascular devices, implants and grafts, initial encounter: Principal | ICD-10-CM

## 2020-09-22 DIAGNOSIS — R627 Adult failure to thrive: Principal | ICD-10-CM

## 2020-09-22 DIAGNOSIS — Z7901 Long term (current) use of anticoagulants: Principal | ICD-10-CM

## 2020-09-22 DIAGNOSIS — J9601 Acute respiratory failure with hypoxia: Principal | ICD-10-CM

## 2020-09-22 DIAGNOSIS — G2581 Restless legs syndrome: Principal | ICD-10-CM

## 2020-09-22 DIAGNOSIS — A319 Mycobacterial infection, unspecified: Principal | ICD-10-CM

## 2020-09-22 LAB — TACROLIMUS LEVEL: Tacrolimus (FK506) - LabCorp: 6.7 ng/mL (ref 2.0–20.0)

## 2020-09-23 DIAGNOSIS — Z992 Dependence on renal dialysis: Principal | ICD-10-CM

## 2020-09-23 DIAGNOSIS — G2581 Restless legs syndrome: Principal | ICD-10-CM

## 2020-09-23 DIAGNOSIS — Z7982 Long term (current) use of aspirin: Principal | ICD-10-CM

## 2020-09-23 DIAGNOSIS — N179 Acute kidney failure, unspecified: Principal | ICD-10-CM

## 2020-09-23 DIAGNOSIS — H9190 Unspecified hearing loss, unspecified ear: Principal | ICD-10-CM

## 2020-09-23 DIAGNOSIS — Z941 Heart transplant status: Principal | ICD-10-CM

## 2020-09-23 DIAGNOSIS — I132 Hypertensive heart and chronic kidney disease with heart failure and with stage 5 chronic kidney disease, or end stage renal disease: Principal | ICD-10-CM

## 2020-09-23 DIAGNOSIS — I255 Ischemic cardiomyopathy: Principal | ICD-10-CM

## 2020-09-23 DIAGNOSIS — F329 Major depressive disorder, single episode, unspecified: Principal | ICD-10-CM

## 2020-09-23 DIAGNOSIS — Z7901 Long term (current) use of anticoagulants: Principal | ICD-10-CM

## 2020-09-23 DIAGNOSIS — I2510 Atherosclerotic heart disease of native coronary artery without angina pectoris: Principal | ICD-10-CM

## 2020-09-23 DIAGNOSIS — D500 Iron deficiency anemia secondary to blood loss (chronic): Principal | ICD-10-CM

## 2020-09-23 DIAGNOSIS — J9601 Acute respiratory failure with hypoxia: Principal | ICD-10-CM

## 2020-09-23 DIAGNOSIS — R6881 Early satiety: Principal | ICD-10-CM

## 2020-09-23 DIAGNOSIS — T827XXA Infection and inflammatory reaction due to other cardiac and vascular devices, implants and grafts, initial encounter: Principal | ICD-10-CM

## 2020-09-23 DIAGNOSIS — D689 Coagulation defect, unspecified: Principal | ICD-10-CM

## 2020-09-23 DIAGNOSIS — D631 Anemia in chronic kidney disease: Principal | ICD-10-CM

## 2020-09-23 DIAGNOSIS — D61818 Other pancytopenia: Principal | ICD-10-CM

## 2020-09-23 DIAGNOSIS — N2581 Secondary hyperparathyroidism of renal origin: Principal | ICD-10-CM

## 2020-09-23 DIAGNOSIS — J188 Other pneumonia, unspecified organism: Principal | ICD-10-CM

## 2020-09-23 DIAGNOSIS — J9602 Acute respiratory failure with hypercapnia: Principal | ICD-10-CM

## 2020-09-23 DIAGNOSIS — R630 Anorexia: Principal | ICD-10-CM

## 2020-09-23 DIAGNOSIS — Z20822 Contact with and (suspected) exposure to covid-19: Principal | ICD-10-CM

## 2020-09-23 DIAGNOSIS — D849 Immunodeficiency, unspecified: Principal | ICD-10-CM

## 2020-09-23 DIAGNOSIS — E785 Hyperlipidemia, unspecified: Principal | ICD-10-CM

## 2020-09-23 DIAGNOSIS — A4189 Other specified sepsis: Principal | ICD-10-CM

## 2020-09-23 DIAGNOSIS — K529 Noninfective gastroenteritis and colitis, unspecified: Principal | ICD-10-CM

## 2020-09-23 DIAGNOSIS — Z6821 Body mass index (BMI) 21.0-21.9, adult: Principal | ICD-10-CM

## 2020-09-23 DIAGNOSIS — E43 Unspecified severe protein-calorie malnutrition: Principal | ICD-10-CM

## 2020-09-23 DIAGNOSIS — N186 End stage renal disease: Principal | ICD-10-CM

## 2020-09-23 DIAGNOSIS — J9811 Atelectasis: Principal | ICD-10-CM

## 2020-09-23 DIAGNOSIS — B370 Candidal stomatitis: Principal | ICD-10-CM

## 2020-09-23 DIAGNOSIS — I5022 Chronic systolic (congestive) heart failure: Principal | ICD-10-CM

## 2020-09-23 DIAGNOSIS — A319 Mycobacterial infection, unspecified: Principal | ICD-10-CM

## 2020-09-23 DIAGNOSIS — R627 Adult failure to thrive: Principal | ICD-10-CM

## 2020-09-25 DIAGNOSIS — D500 Iron deficiency anemia secondary to blood loss (chronic): Principal | ICD-10-CM

## 2020-09-25 DIAGNOSIS — I255 Ischemic cardiomyopathy: Principal | ICD-10-CM

## 2020-09-25 DIAGNOSIS — J9601 Acute respiratory failure with hypoxia: Principal | ICD-10-CM

## 2020-09-25 DIAGNOSIS — Z992 Dependence on renal dialysis: Principal | ICD-10-CM

## 2020-09-25 DIAGNOSIS — Z7982 Long term (current) use of aspirin: Principal | ICD-10-CM

## 2020-09-25 DIAGNOSIS — N186 End stage renal disease: Principal | ICD-10-CM

## 2020-09-25 DIAGNOSIS — Z7901 Long term (current) use of anticoagulants: Principal | ICD-10-CM

## 2020-09-25 DIAGNOSIS — D61818 Other pancytopenia: Principal | ICD-10-CM

## 2020-09-25 DIAGNOSIS — H9190 Unspecified hearing loss, unspecified ear: Principal | ICD-10-CM

## 2020-09-25 DIAGNOSIS — N2581 Secondary hyperparathyroidism of renal origin: Principal | ICD-10-CM

## 2020-09-25 DIAGNOSIS — R630 Anorexia: Principal | ICD-10-CM

## 2020-09-25 DIAGNOSIS — A319 Mycobacterial infection, unspecified: Principal | ICD-10-CM

## 2020-09-25 DIAGNOSIS — F329 Major depressive disorder, single episode, unspecified: Principal | ICD-10-CM

## 2020-09-25 DIAGNOSIS — D849 Immunodeficiency, unspecified: Principal | ICD-10-CM

## 2020-09-25 DIAGNOSIS — T827XXA Infection and inflammatory reaction due to other cardiac and vascular devices, implants and grafts, initial encounter: Principal | ICD-10-CM

## 2020-09-25 DIAGNOSIS — J9602 Acute respiratory failure with hypercapnia: Principal | ICD-10-CM

## 2020-09-25 DIAGNOSIS — R627 Adult failure to thrive: Principal | ICD-10-CM

## 2020-09-25 DIAGNOSIS — I2510 Atherosclerotic heart disease of native coronary artery without angina pectoris: Principal | ICD-10-CM

## 2020-09-25 DIAGNOSIS — B370 Candidal stomatitis: Principal | ICD-10-CM

## 2020-09-25 DIAGNOSIS — E785 Hyperlipidemia, unspecified: Principal | ICD-10-CM

## 2020-09-25 DIAGNOSIS — K529 Noninfective gastroenteritis and colitis, unspecified: Principal | ICD-10-CM

## 2020-09-25 DIAGNOSIS — R6881 Early satiety: Principal | ICD-10-CM

## 2020-09-25 DIAGNOSIS — I5022 Chronic systolic (congestive) heart failure: Principal | ICD-10-CM

## 2020-09-25 DIAGNOSIS — J9811 Atelectasis: Principal | ICD-10-CM

## 2020-09-25 DIAGNOSIS — I132 Hypertensive heart and chronic kidney disease with heart failure and with stage 5 chronic kidney disease, or end stage renal disease: Principal | ICD-10-CM

## 2020-09-25 DIAGNOSIS — Z941 Heart transplant status: Principal | ICD-10-CM

## 2020-09-25 DIAGNOSIS — D689 Coagulation defect, unspecified: Principal | ICD-10-CM

## 2020-09-25 DIAGNOSIS — Z20822 Contact with and (suspected) exposure to covid-19: Principal | ICD-10-CM

## 2020-09-25 DIAGNOSIS — J188 Other pneumonia, unspecified organism: Principal | ICD-10-CM

## 2020-09-25 DIAGNOSIS — E43 Unspecified severe protein-calorie malnutrition: Principal | ICD-10-CM

## 2020-09-25 DIAGNOSIS — D631 Anemia in chronic kidney disease: Principal | ICD-10-CM

## 2020-09-25 DIAGNOSIS — N179 Acute kidney failure, unspecified: Principal | ICD-10-CM

## 2020-09-25 DIAGNOSIS — A4189 Other specified sepsis: Principal | ICD-10-CM

## 2020-09-25 DIAGNOSIS — Z6821 Body mass index (BMI) 21.0-21.9, adult: Principal | ICD-10-CM

## 2020-09-25 DIAGNOSIS — G2581 Restless legs syndrome: Principal | ICD-10-CM

## 2020-09-28 DIAGNOSIS — K529 Noninfective gastroenteritis and colitis, unspecified: Principal | ICD-10-CM

## 2020-09-28 DIAGNOSIS — R6881 Early satiety: Principal | ICD-10-CM

## 2020-09-28 DIAGNOSIS — T827XXA Infection and inflammatory reaction due to other cardiac and vascular devices, implants and grafts, initial encounter: Principal | ICD-10-CM

## 2020-09-28 DIAGNOSIS — D849 Immunodeficiency, unspecified: Principal | ICD-10-CM

## 2020-09-28 DIAGNOSIS — Z20822 Contact with and (suspected) exposure to covid-19: Principal | ICD-10-CM

## 2020-09-28 DIAGNOSIS — E43 Unspecified severe protein-calorie malnutrition: Principal | ICD-10-CM

## 2020-09-28 DIAGNOSIS — J188 Other pneumonia, unspecified organism: Principal | ICD-10-CM

## 2020-09-28 DIAGNOSIS — D61818 Other pancytopenia: Principal | ICD-10-CM

## 2020-09-28 DIAGNOSIS — A4189 Other specified sepsis: Principal | ICD-10-CM

## 2020-09-28 DIAGNOSIS — H9190 Unspecified hearing loss, unspecified ear: Principal | ICD-10-CM

## 2020-09-28 DIAGNOSIS — D689 Coagulation defect, unspecified: Principal | ICD-10-CM

## 2020-09-28 DIAGNOSIS — J9602 Acute respiratory failure with hypercapnia: Principal | ICD-10-CM

## 2020-09-28 DIAGNOSIS — Z941 Heart transplant status: Principal | ICD-10-CM

## 2020-09-28 DIAGNOSIS — I132 Hypertensive heart and chronic kidney disease with heart failure and with stage 5 chronic kidney disease, or end stage renal disease: Principal | ICD-10-CM

## 2020-09-28 DIAGNOSIS — A319 Mycobacterial infection, unspecified: Principal | ICD-10-CM

## 2020-09-28 DIAGNOSIS — G2581 Restless legs syndrome: Principal | ICD-10-CM

## 2020-09-28 DIAGNOSIS — I255 Ischemic cardiomyopathy: Principal | ICD-10-CM

## 2020-09-28 DIAGNOSIS — J9811 Atelectasis: Principal | ICD-10-CM

## 2020-09-28 DIAGNOSIS — Z6821 Body mass index (BMI) 21.0-21.9, adult: Principal | ICD-10-CM

## 2020-09-28 DIAGNOSIS — R630 Anorexia: Principal | ICD-10-CM

## 2020-09-28 DIAGNOSIS — D500 Iron deficiency anemia secondary to blood loss (chronic): Principal | ICD-10-CM

## 2020-09-28 DIAGNOSIS — Z7901 Long term (current) use of anticoagulants: Principal | ICD-10-CM

## 2020-09-28 DIAGNOSIS — N2581 Secondary hyperparathyroidism of renal origin: Principal | ICD-10-CM

## 2020-09-28 DIAGNOSIS — N179 Acute kidney failure, unspecified: Principal | ICD-10-CM

## 2020-09-28 DIAGNOSIS — E785 Hyperlipidemia, unspecified: Principal | ICD-10-CM

## 2020-09-28 DIAGNOSIS — F329 Major depressive disorder, single episode, unspecified: Principal | ICD-10-CM

## 2020-09-28 DIAGNOSIS — I5022 Chronic systolic (congestive) heart failure: Principal | ICD-10-CM

## 2020-09-28 DIAGNOSIS — I2510 Atherosclerotic heart disease of native coronary artery without angina pectoris: Principal | ICD-10-CM

## 2020-09-28 DIAGNOSIS — Z7982 Long term (current) use of aspirin: Principal | ICD-10-CM

## 2020-09-28 DIAGNOSIS — Z992 Dependence on renal dialysis: Principal | ICD-10-CM

## 2020-09-28 DIAGNOSIS — J9601 Acute respiratory failure with hypoxia: Principal | ICD-10-CM

## 2020-09-28 DIAGNOSIS — N186 End stage renal disease: Principal | ICD-10-CM

## 2020-09-28 DIAGNOSIS — B370 Candidal stomatitis: Principal | ICD-10-CM

## 2020-09-28 DIAGNOSIS — D631 Anemia in chronic kidney disease: Principal | ICD-10-CM

## 2020-09-28 DIAGNOSIS — R627 Adult failure to thrive: Principal | ICD-10-CM

## 2020-09-29 DIAGNOSIS — N186 End stage renal disease: Principal | ICD-10-CM

## 2020-09-29 DIAGNOSIS — Z6821 Body mass index (BMI) 21.0-21.9, adult: Principal | ICD-10-CM

## 2020-09-29 DIAGNOSIS — Z992 Dependence on renal dialysis: Principal | ICD-10-CM

## 2020-09-29 DIAGNOSIS — R630 Anorexia: Principal | ICD-10-CM

## 2020-09-29 DIAGNOSIS — I132 Hypertensive heart and chronic kidney disease with heart failure and with stage 5 chronic kidney disease, or end stage renal disease: Principal | ICD-10-CM

## 2020-09-29 DIAGNOSIS — K529 Noninfective gastroenteritis and colitis, unspecified: Principal | ICD-10-CM

## 2020-09-29 DIAGNOSIS — A4189 Other specified sepsis: Principal | ICD-10-CM

## 2020-09-29 DIAGNOSIS — I2510 Atherosclerotic heart disease of native coronary artery without angina pectoris: Principal | ICD-10-CM

## 2020-09-29 DIAGNOSIS — F329 Major depressive disorder, single episode, unspecified: Principal | ICD-10-CM

## 2020-09-29 DIAGNOSIS — Z7901 Long term (current) use of anticoagulants: Principal | ICD-10-CM

## 2020-09-29 DIAGNOSIS — J9602 Acute respiratory failure with hypercapnia: Principal | ICD-10-CM

## 2020-09-29 DIAGNOSIS — E785 Hyperlipidemia, unspecified: Principal | ICD-10-CM

## 2020-09-29 DIAGNOSIS — D689 Coagulation defect, unspecified: Principal | ICD-10-CM

## 2020-09-29 DIAGNOSIS — R6881 Early satiety: Principal | ICD-10-CM

## 2020-09-29 DIAGNOSIS — B370 Candidal stomatitis: Principal | ICD-10-CM

## 2020-09-29 DIAGNOSIS — H9190 Unspecified hearing loss, unspecified ear: Principal | ICD-10-CM

## 2020-09-29 DIAGNOSIS — A319 Mycobacterial infection, unspecified: Principal | ICD-10-CM

## 2020-09-29 DIAGNOSIS — J188 Other pneumonia, unspecified organism: Principal | ICD-10-CM

## 2020-09-29 DIAGNOSIS — G2581 Restless legs syndrome: Principal | ICD-10-CM

## 2020-09-29 DIAGNOSIS — D61818 Other pancytopenia: Principal | ICD-10-CM

## 2020-09-29 DIAGNOSIS — E43 Unspecified severe protein-calorie malnutrition: Principal | ICD-10-CM

## 2020-09-29 DIAGNOSIS — Z7982 Long term (current) use of aspirin: Principal | ICD-10-CM

## 2020-09-29 DIAGNOSIS — D500 Iron deficiency anemia secondary to blood loss (chronic): Principal | ICD-10-CM

## 2020-09-29 DIAGNOSIS — Z20822 Contact with and (suspected) exposure to covid-19: Principal | ICD-10-CM

## 2020-09-29 DIAGNOSIS — R627 Adult failure to thrive: Principal | ICD-10-CM

## 2020-09-29 DIAGNOSIS — I5022 Chronic systolic (congestive) heart failure: Principal | ICD-10-CM

## 2020-09-29 DIAGNOSIS — J9601 Acute respiratory failure with hypoxia: Principal | ICD-10-CM

## 2020-09-29 DIAGNOSIS — J9811 Atelectasis: Principal | ICD-10-CM

## 2020-09-29 DIAGNOSIS — D849 Immunodeficiency, unspecified: Principal | ICD-10-CM

## 2020-09-29 DIAGNOSIS — Z941 Heart transplant status: Principal | ICD-10-CM

## 2020-09-29 DIAGNOSIS — I255 Ischemic cardiomyopathy: Principal | ICD-10-CM

## 2020-09-29 DIAGNOSIS — D631 Anemia in chronic kidney disease: Principal | ICD-10-CM

## 2020-09-29 DIAGNOSIS — N2581 Secondary hyperparathyroidism of renal origin: Principal | ICD-10-CM

## 2020-09-29 DIAGNOSIS — N179 Acute kidney failure, unspecified: Principal | ICD-10-CM

## 2020-09-29 DIAGNOSIS — T827XXA Infection and inflammatory reaction due to other cardiac and vascular devices, implants and grafts, initial encounter: Principal | ICD-10-CM

## 2020-09-30 DIAGNOSIS — Z941 Heart transplant status: Principal | ICD-10-CM

## 2020-09-30 DIAGNOSIS — N2581 Secondary hyperparathyroidism of renal origin: Principal | ICD-10-CM

## 2020-09-30 DIAGNOSIS — Z6821 Body mass index (BMI) 21.0-21.9, adult: Principal | ICD-10-CM

## 2020-09-30 DIAGNOSIS — F329 Major depressive disorder, single episode, unspecified: Principal | ICD-10-CM

## 2020-09-30 DIAGNOSIS — D61818 Other pancytopenia: Principal | ICD-10-CM

## 2020-09-30 DIAGNOSIS — K529 Noninfective gastroenteritis and colitis, unspecified: Principal | ICD-10-CM

## 2020-09-30 DIAGNOSIS — E785 Hyperlipidemia, unspecified: Principal | ICD-10-CM

## 2020-09-30 DIAGNOSIS — Z7901 Long term (current) use of anticoagulants: Principal | ICD-10-CM

## 2020-09-30 DIAGNOSIS — A4189 Other specified sepsis: Principal | ICD-10-CM

## 2020-09-30 DIAGNOSIS — Z992 Dependence on renal dialysis: Principal | ICD-10-CM

## 2020-09-30 DIAGNOSIS — D500 Iron deficiency anemia secondary to blood loss (chronic): Principal | ICD-10-CM

## 2020-09-30 DIAGNOSIS — B370 Candidal stomatitis: Principal | ICD-10-CM

## 2020-09-30 DIAGNOSIS — I2510 Atherosclerotic heart disease of native coronary artery without angina pectoris: Principal | ICD-10-CM

## 2020-09-30 DIAGNOSIS — T827XXA Infection and inflammatory reaction due to other cardiac and vascular devices, implants and grafts, initial encounter: Principal | ICD-10-CM

## 2020-09-30 DIAGNOSIS — R630 Anorexia: Principal | ICD-10-CM

## 2020-09-30 DIAGNOSIS — N179 Acute kidney failure, unspecified: Principal | ICD-10-CM

## 2020-09-30 DIAGNOSIS — I132 Hypertensive heart and chronic kidney disease with heart failure and with stage 5 chronic kidney disease, or end stage renal disease: Principal | ICD-10-CM

## 2020-09-30 DIAGNOSIS — D849 Immunodeficiency, unspecified: Principal | ICD-10-CM

## 2020-09-30 DIAGNOSIS — Z7982 Long term (current) use of aspirin: Principal | ICD-10-CM

## 2020-09-30 DIAGNOSIS — R627 Adult failure to thrive: Principal | ICD-10-CM

## 2020-09-30 DIAGNOSIS — J188 Other pneumonia, unspecified organism: Principal | ICD-10-CM

## 2020-09-30 DIAGNOSIS — Z20822 Contact with and (suspected) exposure to covid-19: Principal | ICD-10-CM

## 2020-09-30 DIAGNOSIS — H9190 Unspecified hearing loss, unspecified ear: Principal | ICD-10-CM

## 2020-09-30 DIAGNOSIS — N186 End stage renal disease: Principal | ICD-10-CM

## 2020-09-30 DIAGNOSIS — I255 Ischemic cardiomyopathy: Principal | ICD-10-CM

## 2020-09-30 DIAGNOSIS — D631 Anemia in chronic kidney disease: Principal | ICD-10-CM

## 2020-09-30 DIAGNOSIS — J9602 Acute respiratory failure with hypercapnia: Principal | ICD-10-CM

## 2020-09-30 DIAGNOSIS — I5022 Chronic systolic (congestive) heart failure: Principal | ICD-10-CM

## 2020-09-30 DIAGNOSIS — R6881 Early satiety: Principal | ICD-10-CM

## 2020-09-30 DIAGNOSIS — E43 Unspecified severe protein-calorie malnutrition: Principal | ICD-10-CM

## 2020-09-30 DIAGNOSIS — G2581 Restless legs syndrome: Principal | ICD-10-CM

## 2020-09-30 DIAGNOSIS — J9811 Atelectasis: Principal | ICD-10-CM

## 2020-09-30 DIAGNOSIS — J9601 Acute respiratory failure with hypoxia: Principal | ICD-10-CM

## 2020-09-30 DIAGNOSIS — A319 Mycobacterial infection, unspecified: Principal | ICD-10-CM

## 2020-09-30 DIAGNOSIS — D689 Coagulation defect, unspecified: Principal | ICD-10-CM

## 2020-10-02 DIAGNOSIS — J188 Other pneumonia, unspecified organism: Principal | ICD-10-CM

## 2020-10-02 DIAGNOSIS — I5022 Chronic systolic (congestive) heart failure: Principal | ICD-10-CM

## 2020-10-02 DIAGNOSIS — E785 Hyperlipidemia, unspecified: Principal | ICD-10-CM

## 2020-10-02 DIAGNOSIS — N186 End stage renal disease: Principal | ICD-10-CM

## 2020-10-02 DIAGNOSIS — I2510 Atherosclerotic heart disease of native coronary artery without angina pectoris: Principal | ICD-10-CM

## 2020-10-02 DIAGNOSIS — R6881 Early satiety: Principal | ICD-10-CM

## 2020-10-02 DIAGNOSIS — G2581 Restless legs syndrome: Principal | ICD-10-CM

## 2020-10-02 DIAGNOSIS — Z7901 Long term (current) use of anticoagulants: Principal | ICD-10-CM

## 2020-10-02 DIAGNOSIS — F329 Major depressive disorder, single episode, unspecified: Principal | ICD-10-CM

## 2020-10-02 DIAGNOSIS — A4189 Other specified sepsis: Principal | ICD-10-CM

## 2020-10-02 DIAGNOSIS — Z7982 Long term (current) use of aspirin: Principal | ICD-10-CM

## 2020-10-02 DIAGNOSIS — B370 Candidal stomatitis: Principal | ICD-10-CM

## 2020-10-02 DIAGNOSIS — Z6821 Body mass index (BMI) 21.0-21.9, adult: Principal | ICD-10-CM

## 2020-10-02 DIAGNOSIS — D61818 Other pancytopenia: Principal | ICD-10-CM

## 2020-10-02 DIAGNOSIS — D500 Iron deficiency anemia secondary to blood loss (chronic): Principal | ICD-10-CM

## 2020-10-02 DIAGNOSIS — T827XXA Infection and inflammatory reaction due to other cardiac and vascular devices, implants and grafts, initial encounter: Principal | ICD-10-CM

## 2020-10-02 DIAGNOSIS — Z20822 Contact with and (suspected) exposure to covid-19: Principal | ICD-10-CM

## 2020-10-02 DIAGNOSIS — R627 Adult failure to thrive: Principal | ICD-10-CM

## 2020-10-02 DIAGNOSIS — I255 Ischemic cardiomyopathy: Principal | ICD-10-CM

## 2020-10-02 DIAGNOSIS — N179 Acute kidney failure, unspecified: Principal | ICD-10-CM

## 2020-10-02 DIAGNOSIS — I132 Hypertensive heart and chronic kidney disease with heart failure and with stage 5 chronic kidney disease, or end stage renal disease: Principal | ICD-10-CM

## 2020-10-02 DIAGNOSIS — K529 Noninfective gastroenteritis and colitis, unspecified: Principal | ICD-10-CM

## 2020-10-02 DIAGNOSIS — H9190 Unspecified hearing loss, unspecified ear: Principal | ICD-10-CM

## 2020-10-02 DIAGNOSIS — Z992 Dependence on renal dialysis: Principal | ICD-10-CM

## 2020-10-02 DIAGNOSIS — J9601 Acute respiratory failure with hypoxia: Principal | ICD-10-CM

## 2020-10-02 DIAGNOSIS — R630 Anorexia: Principal | ICD-10-CM

## 2020-10-02 DIAGNOSIS — D631 Anemia in chronic kidney disease: Principal | ICD-10-CM

## 2020-10-02 DIAGNOSIS — J9602 Acute respiratory failure with hypercapnia: Principal | ICD-10-CM

## 2020-10-02 DIAGNOSIS — J9811 Atelectasis: Principal | ICD-10-CM

## 2020-10-02 DIAGNOSIS — A319 Mycobacterial infection, unspecified: Principal | ICD-10-CM

## 2020-10-02 DIAGNOSIS — D689 Coagulation defect, unspecified: Principal | ICD-10-CM

## 2020-10-02 DIAGNOSIS — E43 Unspecified severe protein-calorie malnutrition: Principal | ICD-10-CM

## 2020-10-02 DIAGNOSIS — N2581 Secondary hyperparathyroidism of renal origin: Principal | ICD-10-CM

## 2020-10-02 DIAGNOSIS — Z941 Heart transplant status: Principal | ICD-10-CM

## 2020-10-02 DIAGNOSIS — D849 Immunodeficiency, unspecified: Principal | ICD-10-CM

## 2020-10-05 DIAGNOSIS — D61818 Other pancytopenia: Principal | ICD-10-CM

## 2020-10-05 DIAGNOSIS — Z7901 Long term (current) use of anticoagulants: Principal | ICD-10-CM

## 2020-10-05 DIAGNOSIS — A4189 Other specified sepsis: Principal | ICD-10-CM

## 2020-10-05 DIAGNOSIS — D689 Coagulation defect, unspecified: Principal | ICD-10-CM

## 2020-10-05 DIAGNOSIS — I2510 Atherosclerotic heart disease of native coronary artery without angina pectoris: Principal | ICD-10-CM

## 2020-10-05 DIAGNOSIS — Z6821 Body mass index (BMI) 21.0-21.9, adult: Principal | ICD-10-CM

## 2020-10-05 DIAGNOSIS — N179 Acute kidney failure, unspecified: Principal | ICD-10-CM

## 2020-10-05 DIAGNOSIS — N186 End stage renal disease: Principal | ICD-10-CM

## 2020-10-05 DIAGNOSIS — K529 Noninfective gastroenteritis and colitis, unspecified: Principal | ICD-10-CM

## 2020-10-05 DIAGNOSIS — B370 Candidal stomatitis: Principal | ICD-10-CM

## 2020-10-05 DIAGNOSIS — J9601 Acute respiratory failure with hypoxia: Principal | ICD-10-CM

## 2020-10-05 DIAGNOSIS — I132 Hypertensive heart and chronic kidney disease with heart failure and with stage 5 chronic kidney disease, or end stage renal disease: Principal | ICD-10-CM

## 2020-10-05 DIAGNOSIS — J9811 Atelectasis: Principal | ICD-10-CM

## 2020-10-05 DIAGNOSIS — Z7982 Long term (current) use of aspirin: Principal | ICD-10-CM

## 2020-10-05 DIAGNOSIS — D500 Iron deficiency anemia secondary to blood loss (chronic): Principal | ICD-10-CM

## 2020-10-05 DIAGNOSIS — J188 Other pneumonia, unspecified organism: Principal | ICD-10-CM

## 2020-10-05 DIAGNOSIS — G2581 Restless legs syndrome: Principal | ICD-10-CM

## 2020-10-05 DIAGNOSIS — R6881 Early satiety: Principal | ICD-10-CM

## 2020-10-05 DIAGNOSIS — Z20822 Contact with and (suspected) exposure to covid-19: Principal | ICD-10-CM

## 2020-10-05 DIAGNOSIS — A319 Mycobacterial infection, unspecified: Principal | ICD-10-CM

## 2020-10-05 DIAGNOSIS — H9190 Unspecified hearing loss, unspecified ear: Principal | ICD-10-CM

## 2020-10-05 DIAGNOSIS — I255 Ischemic cardiomyopathy: Principal | ICD-10-CM

## 2020-10-05 DIAGNOSIS — E43 Unspecified severe protein-calorie malnutrition: Principal | ICD-10-CM

## 2020-10-05 DIAGNOSIS — J9602 Acute respiratory failure with hypercapnia: Principal | ICD-10-CM

## 2020-10-05 DIAGNOSIS — Z941 Heart transplant status: Principal | ICD-10-CM

## 2020-10-05 DIAGNOSIS — E785 Hyperlipidemia, unspecified: Principal | ICD-10-CM

## 2020-10-05 DIAGNOSIS — N2581 Secondary hyperparathyroidism of renal origin: Principal | ICD-10-CM

## 2020-10-05 DIAGNOSIS — I5022 Chronic systolic (congestive) heart failure: Principal | ICD-10-CM

## 2020-10-05 DIAGNOSIS — F329 Major depressive disorder, single episode, unspecified: Principal | ICD-10-CM

## 2020-10-05 DIAGNOSIS — R630 Anorexia: Principal | ICD-10-CM

## 2020-10-05 DIAGNOSIS — D631 Anemia in chronic kidney disease: Principal | ICD-10-CM

## 2020-10-05 DIAGNOSIS — Z992 Dependence on renal dialysis: Principal | ICD-10-CM

## 2020-10-05 DIAGNOSIS — R627 Adult failure to thrive: Principal | ICD-10-CM

## 2020-10-05 DIAGNOSIS — D849 Immunodeficiency, unspecified: Principal | ICD-10-CM

## 2020-10-05 DIAGNOSIS — T827XXA Infection and inflammatory reaction due to other cardiac and vascular devices, implants and grafts, initial encounter: Principal | ICD-10-CM

## 2020-10-07 DIAGNOSIS — D500 Iron deficiency anemia secondary to blood loss (chronic): Principal | ICD-10-CM

## 2020-10-07 DIAGNOSIS — J9601 Acute respiratory failure with hypoxia: Principal | ICD-10-CM

## 2020-10-07 DIAGNOSIS — N179 Acute kidney failure, unspecified: Principal | ICD-10-CM

## 2020-10-07 DIAGNOSIS — A319 Mycobacterial infection, unspecified: Principal | ICD-10-CM

## 2020-10-07 DIAGNOSIS — J188 Other pneumonia, unspecified organism: Principal | ICD-10-CM

## 2020-10-07 DIAGNOSIS — I255 Ischemic cardiomyopathy: Principal | ICD-10-CM

## 2020-10-07 DIAGNOSIS — I132 Hypertensive heart and chronic kidney disease with heart failure and with stage 5 chronic kidney disease, or end stage renal disease: Principal | ICD-10-CM

## 2020-10-07 DIAGNOSIS — J9811 Atelectasis: Principal | ICD-10-CM

## 2020-10-07 DIAGNOSIS — N2581 Secondary hyperparathyroidism of renal origin: Principal | ICD-10-CM

## 2020-10-07 DIAGNOSIS — R6881 Early satiety: Principal | ICD-10-CM

## 2020-10-07 DIAGNOSIS — K529 Noninfective gastroenteritis and colitis, unspecified: Principal | ICD-10-CM

## 2020-10-07 DIAGNOSIS — I2510 Atherosclerotic heart disease of native coronary artery without angina pectoris: Principal | ICD-10-CM

## 2020-10-07 DIAGNOSIS — Z992 Dependence on renal dialysis: Principal | ICD-10-CM

## 2020-10-07 DIAGNOSIS — E785 Hyperlipidemia, unspecified: Principal | ICD-10-CM

## 2020-10-07 DIAGNOSIS — T827XXA Infection and inflammatory reaction due to other cardiac and vascular devices, implants and grafts, initial encounter: Principal | ICD-10-CM

## 2020-10-07 DIAGNOSIS — D61818 Other pancytopenia: Principal | ICD-10-CM

## 2020-10-07 DIAGNOSIS — Z7901 Long term (current) use of anticoagulants: Principal | ICD-10-CM

## 2020-10-07 DIAGNOSIS — D849 Immunodeficiency, unspecified: Principal | ICD-10-CM

## 2020-10-07 DIAGNOSIS — A4189 Other specified sepsis: Principal | ICD-10-CM

## 2020-10-07 DIAGNOSIS — H9190 Unspecified hearing loss, unspecified ear: Principal | ICD-10-CM

## 2020-10-07 DIAGNOSIS — Z7982 Long term (current) use of aspirin: Principal | ICD-10-CM

## 2020-10-07 DIAGNOSIS — Z20822 Contact with and (suspected) exposure to covid-19: Principal | ICD-10-CM

## 2020-10-07 DIAGNOSIS — F329 Major depressive disorder, single episode, unspecified: Principal | ICD-10-CM

## 2020-10-07 DIAGNOSIS — G2581 Restless legs syndrome: Principal | ICD-10-CM

## 2020-10-07 DIAGNOSIS — D689 Coagulation defect, unspecified: Principal | ICD-10-CM

## 2020-10-07 DIAGNOSIS — J9602 Acute respiratory failure with hypercapnia: Principal | ICD-10-CM

## 2020-10-07 DIAGNOSIS — D631 Anemia in chronic kidney disease: Principal | ICD-10-CM

## 2020-10-07 DIAGNOSIS — N186 End stage renal disease: Principal | ICD-10-CM

## 2020-10-07 DIAGNOSIS — R627 Adult failure to thrive: Principal | ICD-10-CM

## 2020-10-07 DIAGNOSIS — R630 Anorexia: Principal | ICD-10-CM

## 2020-10-07 DIAGNOSIS — B370 Candidal stomatitis: Principal | ICD-10-CM

## 2020-10-07 DIAGNOSIS — I5022 Chronic systolic (congestive) heart failure: Principal | ICD-10-CM

## 2020-10-07 DIAGNOSIS — Z941 Heart transplant status: Principal | ICD-10-CM

## 2020-10-07 DIAGNOSIS — Z6821 Body mass index (BMI) 21.0-21.9, adult: Principal | ICD-10-CM

## 2020-10-07 DIAGNOSIS — E43 Unspecified severe protein-calorie malnutrition: Principal | ICD-10-CM

## 2020-10-08 DIAGNOSIS — N186 End stage renal disease: Principal | ICD-10-CM

## 2020-10-08 DIAGNOSIS — E785 Hyperlipidemia, unspecified: Principal | ICD-10-CM

## 2020-10-08 DIAGNOSIS — I132 Hypertensive heart and chronic kidney disease with heart failure and with stage 5 chronic kidney disease, or end stage renal disease: Principal | ICD-10-CM

## 2020-10-08 DIAGNOSIS — I255 Ischemic cardiomyopathy: Principal | ICD-10-CM

## 2020-10-08 DIAGNOSIS — D61818 Other pancytopenia: Principal | ICD-10-CM

## 2020-10-08 DIAGNOSIS — T827XXA Infection and inflammatory reaction due to other cardiac and vascular devices, implants and grafts, initial encounter: Principal | ICD-10-CM

## 2020-10-08 DIAGNOSIS — Z6821 Body mass index (BMI) 21.0-21.9, adult: Principal | ICD-10-CM

## 2020-10-08 DIAGNOSIS — I2510 Atherosclerotic heart disease of native coronary artery without angina pectoris: Principal | ICD-10-CM

## 2020-10-08 DIAGNOSIS — I5022 Chronic systolic (congestive) heart failure: Principal | ICD-10-CM

## 2020-10-08 DIAGNOSIS — A4189 Other specified sepsis: Principal | ICD-10-CM

## 2020-10-08 DIAGNOSIS — J9601 Acute respiratory failure with hypoxia: Principal | ICD-10-CM

## 2020-10-08 DIAGNOSIS — N179 Acute kidney failure, unspecified: Principal | ICD-10-CM

## 2020-10-08 DIAGNOSIS — Z20822 Contact with and (suspected) exposure to covid-19: Principal | ICD-10-CM

## 2020-10-08 DIAGNOSIS — Z7901 Long term (current) use of anticoagulants: Principal | ICD-10-CM

## 2020-10-08 DIAGNOSIS — D689 Coagulation defect, unspecified: Principal | ICD-10-CM

## 2020-10-08 DIAGNOSIS — J188 Other pneumonia, unspecified organism: Principal | ICD-10-CM

## 2020-10-08 DIAGNOSIS — H9190 Unspecified hearing loss, unspecified ear: Principal | ICD-10-CM

## 2020-10-08 DIAGNOSIS — N2581 Secondary hyperparathyroidism of renal origin: Principal | ICD-10-CM

## 2020-10-08 DIAGNOSIS — Z941 Heart transplant status: Principal | ICD-10-CM

## 2020-10-08 DIAGNOSIS — R6881 Early satiety: Principal | ICD-10-CM

## 2020-10-08 DIAGNOSIS — A319 Mycobacterial infection, unspecified: Principal | ICD-10-CM

## 2020-10-08 DIAGNOSIS — B370 Candidal stomatitis: Principal | ICD-10-CM

## 2020-10-08 DIAGNOSIS — J9811 Atelectasis: Principal | ICD-10-CM

## 2020-10-08 DIAGNOSIS — E43 Unspecified severe protein-calorie malnutrition: Principal | ICD-10-CM

## 2020-10-08 DIAGNOSIS — D631 Anemia in chronic kidney disease: Principal | ICD-10-CM

## 2020-10-08 DIAGNOSIS — Z992 Dependence on renal dialysis: Principal | ICD-10-CM

## 2020-10-08 DIAGNOSIS — K529 Noninfective gastroenteritis and colitis, unspecified: Principal | ICD-10-CM

## 2020-10-08 DIAGNOSIS — D500 Iron deficiency anemia secondary to blood loss (chronic): Principal | ICD-10-CM

## 2020-10-08 DIAGNOSIS — Z7982 Long term (current) use of aspirin: Principal | ICD-10-CM

## 2020-10-08 DIAGNOSIS — G2581 Restless legs syndrome: Principal | ICD-10-CM

## 2020-10-08 DIAGNOSIS — J9602 Acute respiratory failure with hypercapnia: Principal | ICD-10-CM

## 2020-10-08 DIAGNOSIS — F329 Major depressive disorder, single episode, unspecified: Principal | ICD-10-CM

## 2020-10-08 DIAGNOSIS — R630 Anorexia: Principal | ICD-10-CM

## 2020-10-08 DIAGNOSIS — R627 Adult failure to thrive: Principal | ICD-10-CM

## 2020-10-08 DIAGNOSIS — D849 Immunodeficiency, unspecified: Principal | ICD-10-CM

## 2020-10-09 DIAGNOSIS — N179 Acute kidney failure, unspecified: Principal | ICD-10-CM

## 2020-10-09 DIAGNOSIS — J9811 Atelectasis: Principal | ICD-10-CM

## 2020-10-09 DIAGNOSIS — T827XXA Infection and inflammatory reaction due to other cardiac and vascular devices, implants and grafts, initial encounter: Principal | ICD-10-CM

## 2020-10-09 DIAGNOSIS — F329 Major depressive disorder, single episode, unspecified: Principal | ICD-10-CM

## 2020-10-09 DIAGNOSIS — G2581 Restless legs syndrome: Principal | ICD-10-CM

## 2020-10-09 DIAGNOSIS — E43 Unspecified severe protein-calorie malnutrition: Principal | ICD-10-CM

## 2020-10-09 DIAGNOSIS — R630 Anorexia: Principal | ICD-10-CM

## 2020-10-09 DIAGNOSIS — J9602 Acute respiratory failure with hypercapnia: Principal | ICD-10-CM

## 2020-10-09 DIAGNOSIS — Z992 Dependence on renal dialysis: Principal | ICD-10-CM

## 2020-10-09 DIAGNOSIS — A319 Mycobacterial infection, unspecified: Principal | ICD-10-CM

## 2020-10-09 DIAGNOSIS — I255 Ischemic cardiomyopathy: Principal | ICD-10-CM

## 2020-10-09 DIAGNOSIS — Z6821 Body mass index (BMI) 21.0-21.9, adult: Principal | ICD-10-CM

## 2020-10-09 DIAGNOSIS — R627 Adult failure to thrive: Principal | ICD-10-CM

## 2020-10-09 DIAGNOSIS — Z7901 Long term (current) use of anticoagulants: Principal | ICD-10-CM

## 2020-10-09 DIAGNOSIS — Z7982 Long term (current) use of aspirin: Principal | ICD-10-CM

## 2020-10-09 DIAGNOSIS — H9190 Unspecified hearing loss, unspecified ear: Principal | ICD-10-CM

## 2020-10-09 DIAGNOSIS — K529 Noninfective gastroenteritis and colitis, unspecified: Principal | ICD-10-CM

## 2020-10-09 DIAGNOSIS — D849 Immunodeficiency, unspecified: Principal | ICD-10-CM

## 2020-10-09 DIAGNOSIS — I5022 Chronic systolic (congestive) heart failure: Principal | ICD-10-CM

## 2020-10-09 DIAGNOSIS — I132 Hypertensive heart and chronic kidney disease with heart failure and with stage 5 chronic kidney disease, or end stage renal disease: Principal | ICD-10-CM

## 2020-10-09 DIAGNOSIS — D61818 Other pancytopenia: Principal | ICD-10-CM

## 2020-10-09 DIAGNOSIS — N186 End stage renal disease: Principal | ICD-10-CM

## 2020-10-09 DIAGNOSIS — D631 Anemia in chronic kidney disease: Principal | ICD-10-CM

## 2020-10-09 DIAGNOSIS — J188 Other pneumonia, unspecified organism: Principal | ICD-10-CM

## 2020-10-09 DIAGNOSIS — D689 Coagulation defect, unspecified: Principal | ICD-10-CM

## 2020-10-09 DIAGNOSIS — D500 Iron deficiency anemia secondary to blood loss (chronic): Principal | ICD-10-CM

## 2020-10-09 DIAGNOSIS — N2581 Secondary hyperparathyroidism of renal origin: Principal | ICD-10-CM

## 2020-10-09 DIAGNOSIS — Z941 Heart transplant status: Principal | ICD-10-CM

## 2020-10-09 DIAGNOSIS — E785 Hyperlipidemia, unspecified: Principal | ICD-10-CM

## 2020-10-09 DIAGNOSIS — Z20822 Contact with and (suspected) exposure to covid-19: Principal | ICD-10-CM

## 2020-10-09 DIAGNOSIS — A4189 Other specified sepsis: Principal | ICD-10-CM

## 2020-10-09 DIAGNOSIS — B370 Candidal stomatitis: Principal | ICD-10-CM

## 2020-10-09 DIAGNOSIS — I2510 Atherosclerotic heart disease of native coronary artery without angina pectoris: Principal | ICD-10-CM

## 2020-10-09 DIAGNOSIS — R6881 Early satiety: Principal | ICD-10-CM

## 2020-10-09 DIAGNOSIS — J9601 Acute respiratory failure with hypoxia: Principal | ICD-10-CM

## 2020-10-11 DIAGNOSIS — J188 Other pneumonia, unspecified organism: Principal | ICD-10-CM

## 2020-10-11 DIAGNOSIS — Z20822 Contact with and (suspected) exposure to covid-19: Principal | ICD-10-CM

## 2020-10-11 DIAGNOSIS — D500 Iron deficiency anemia secondary to blood loss (chronic): Principal | ICD-10-CM

## 2020-10-11 DIAGNOSIS — I255 Ischemic cardiomyopathy: Principal | ICD-10-CM

## 2020-10-11 DIAGNOSIS — R630 Anorexia: Principal | ICD-10-CM

## 2020-10-11 DIAGNOSIS — N2581 Secondary hyperparathyroidism of renal origin: Principal | ICD-10-CM

## 2020-10-11 DIAGNOSIS — A4189 Other specified sepsis: Principal | ICD-10-CM

## 2020-10-11 DIAGNOSIS — R627 Adult failure to thrive: Principal | ICD-10-CM

## 2020-10-11 DIAGNOSIS — J9811 Atelectasis: Principal | ICD-10-CM

## 2020-10-11 DIAGNOSIS — Z7901 Long term (current) use of anticoagulants: Principal | ICD-10-CM

## 2020-10-11 DIAGNOSIS — D689 Coagulation defect, unspecified: Principal | ICD-10-CM

## 2020-10-11 DIAGNOSIS — D631 Anemia in chronic kidney disease: Principal | ICD-10-CM

## 2020-10-11 DIAGNOSIS — D61818 Other pancytopenia: Principal | ICD-10-CM

## 2020-10-11 DIAGNOSIS — E43 Unspecified severe protein-calorie malnutrition: Principal | ICD-10-CM

## 2020-10-11 DIAGNOSIS — F329 Major depressive disorder, single episode, unspecified: Principal | ICD-10-CM

## 2020-10-11 DIAGNOSIS — K529 Noninfective gastroenteritis and colitis, unspecified: Principal | ICD-10-CM

## 2020-10-11 DIAGNOSIS — Z992 Dependence on renal dialysis: Principal | ICD-10-CM

## 2020-10-11 DIAGNOSIS — D849 Immunodeficiency, unspecified: Principal | ICD-10-CM

## 2020-10-11 DIAGNOSIS — Z6821 Body mass index (BMI) 21.0-21.9, adult: Principal | ICD-10-CM

## 2020-10-11 DIAGNOSIS — N186 End stage renal disease: Principal | ICD-10-CM

## 2020-10-11 DIAGNOSIS — E785 Hyperlipidemia, unspecified: Principal | ICD-10-CM

## 2020-10-11 DIAGNOSIS — H9190 Unspecified hearing loss, unspecified ear: Principal | ICD-10-CM

## 2020-10-11 DIAGNOSIS — J9601 Acute respiratory failure with hypoxia: Principal | ICD-10-CM

## 2020-10-11 DIAGNOSIS — Z7982 Long term (current) use of aspirin: Principal | ICD-10-CM

## 2020-10-11 DIAGNOSIS — G2581 Restless legs syndrome: Principal | ICD-10-CM

## 2020-10-11 DIAGNOSIS — A319 Mycobacterial infection, unspecified: Principal | ICD-10-CM

## 2020-10-11 DIAGNOSIS — I2510 Atherosclerotic heart disease of native coronary artery without angina pectoris: Principal | ICD-10-CM

## 2020-10-11 DIAGNOSIS — R6881 Early satiety: Principal | ICD-10-CM

## 2020-10-11 DIAGNOSIS — J9602 Acute respiratory failure with hypercapnia: Principal | ICD-10-CM

## 2020-10-11 DIAGNOSIS — I132 Hypertensive heart and chronic kidney disease with heart failure and with stage 5 chronic kidney disease, or end stage renal disease: Principal | ICD-10-CM

## 2020-10-11 DIAGNOSIS — N179 Acute kidney failure, unspecified: Principal | ICD-10-CM

## 2020-10-11 DIAGNOSIS — I5022 Chronic systolic (congestive) heart failure: Principal | ICD-10-CM

## 2020-10-11 DIAGNOSIS — B370 Candidal stomatitis: Principal | ICD-10-CM

## 2020-10-11 DIAGNOSIS — Z941 Heart transplant status: Principal | ICD-10-CM

## 2020-10-11 DIAGNOSIS — T827XXA Infection and inflammatory reaction due to other cardiac and vascular devices, implants and grafts, initial encounter: Principal | ICD-10-CM

## 2020-10-13 DIAGNOSIS — G2581 Restless legs syndrome: Principal | ICD-10-CM

## 2020-10-13 DIAGNOSIS — D849 Immunodeficiency, unspecified: Principal | ICD-10-CM

## 2020-10-13 DIAGNOSIS — Z7901 Long term (current) use of anticoagulants: Principal | ICD-10-CM

## 2020-10-13 DIAGNOSIS — I5022 Chronic systolic (congestive) heart failure: Principal | ICD-10-CM

## 2020-10-13 DIAGNOSIS — D500 Iron deficiency anemia secondary to blood loss (chronic): Principal | ICD-10-CM

## 2020-10-13 DIAGNOSIS — Z7982 Long term (current) use of aspirin: Principal | ICD-10-CM

## 2020-10-13 DIAGNOSIS — Z20822 Contact with and (suspected) exposure to covid-19: Principal | ICD-10-CM

## 2020-10-13 DIAGNOSIS — Z6821 Body mass index (BMI) 21.0-21.9, adult: Principal | ICD-10-CM

## 2020-10-13 DIAGNOSIS — D631 Anemia in chronic kidney disease: Principal | ICD-10-CM

## 2020-10-13 DIAGNOSIS — B370 Candidal stomatitis: Principal | ICD-10-CM

## 2020-10-13 DIAGNOSIS — N179 Acute kidney failure, unspecified: Principal | ICD-10-CM

## 2020-10-13 DIAGNOSIS — J9602 Acute respiratory failure with hypercapnia: Principal | ICD-10-CM

## 2020-10-13 DIAGNOSIS — N2581 Secondary hyperparathyroidism of renal origin: Principal | ICD-10-CM

## 2020-10-13 DIAGNOSIS — H9190 Unspecified hearing loss, unspecified ear: Principal | ICD-10-CM

## 2020-10-13 DIAGNOSIS — I132 Hypertensive heart and chronic kidney disease with heart failure and with stage 5 chronic kidney disease, or end stage renal disease: Principal | ICD-10-CM

## 2020-10-13 DIAGNOSIS — R627 Adult failure to thrive: Principal | ICD-10-CM

## 2020-10-13 DIAGNOSIS — I2510 Atherosclerotic heart disease of native coronary artery without angina pectoris: Principal | ICD-10-CM

## 2020-10-13 DIAGNOSIS — J9601 Acute respiratory failure with hypoxia: Principal | ICD-10-CM

## 2020-10-13 DIAGNOSIS — F329 Major depressive disorder, single episode, unspecified: Principal | ICD-10-CM

## 2020-10-13 DIAGNOSIS — D689 Coagulation defect, unspecified: Principal | ICD-10-CM

## 2020-10-13 DIAGNOSIS — D61818 Other pancytopenia: Principal | ICD-10-CM

## 2020-10-13 DIAGNOSIS — N186 End stage renal disease: Principal | ICD-10-CM

## 2020-10-13 DIAGNOSIS — A319 Mycobacterial infection, unspecified: Principal | ICD-10-CM

## 2020-10-13 DIAGNOSIS — E785 Hyperlipidemia, unspecified: Principal | ICD-10-CM

## 2020-10-13 DIAGNOSIS — J9811 Atelectasis: Principal | ICD-10-CM

## 2020-10-13 DIAGNOSIS — T827XXA Infection and inflammatory reaction due to other cardiac and vascular devices, implants and grafts, initial encounter: Principal | ICD-10-CM

## 2020-10-13 DIAGNOSIS — E43 Unspecified severe protein-calorie malnutrition: Principal | ICD-10-CM

## 2020-10-13 DIAGNOSIS — K529 Noninfective gastroenteritis and colitis, unspecified: Principal | ICD-10-CM

## 2020-10-13 DIAGNOSIS — A4189 Other specified sepsis: Principal | ICD-10-CM

## 2020-10-13 DIAGNOSIS — I255 Ischemic cardiomyopathy: Principal | ICD-10-CM

## 2020-10-13 DIAGNOSIS — R6881 Early satiety: Principal | ICD-10-CM

## 2020-10-13 DIAGNOSIS — R630 Anorexia: Principal | ICD-10-CM

## 2020-10-13 DIAGNOSIS — J188 Other pneumonia, unspecified organism: Principal | ICD-10-CM

## 2020-10-13 DIAGNOSIS — Z992 Dependence on renal dialysis: Principal | ICD-10-CM

## 2020-10-13 DIAGNOSIS — Z941 Heart transplant status: Principal | ICD-10-CM

## 2020-10-15 DIAGNOSIS — N186 End stage renal disease: Principal | ICD-10-CM

## 2020-10-15 DIAGNOSIS — I255 Ischemic cardiomyopathy: Principal | ICD-10-CM

## 2020-10-15 DIAGNOSIS — I2510 Atherosclerotic heart disease of native coronary artery without angina pectoris: Principal | ICD-10-CM

## 2020-10-15 DIAGNOSIS — Z7982 Long term (current) use of aspirin: Principal | ICD-10-CM

## 2020-10-15 DIAGNOSIS — N2581 Secondary hyperparathyroidism of renal origin: Principal | ICD-10-CM

## 2020-10-15 DIAGNOSIS — D689 Coagulation defect, unspecified: Principal | ICD-10-CM

## 2020-10-15 DIAGNOSIS — I132 Hypertensive heart and chronic kidney disease with heart failure and with stage 5 chronic kidney disease, or end stage renal disease: Principal | ICD-10-CM

## 2020-10-15 DIAGNOSIS — Z7901 Long term (current) use of anticoagulants: Principal | ICD-10-CM

## 2020-10-15 DIAGNOSIS — J188 Other pneumonia, unspecified organism: Principal | ICD-10-CM

## 2020-10-15 DIAGNOSIS — J9601 Acute respiratory failure with hypoxia: Principal | ICD-10-CM

## 2020-10-15 DIAGNOSIS — E43 Unspecified severe protein-calorie malnutrition: Principal | ICD-10-CM

## 2020-10-15 DIAGNOSIS — Z6821 Body mass index (BMI) 21.0-21.9, adult: Principal | ICD-10-CM

## 2020-10-15 DIAGNOSIS — D500 Iron deficiency anemia secondary to blood loss (chronic): Principal | ICD-10-CM

## 2020-10-15 DIAGNOSIS — Z20822 Contact with and (suspected) exposure to covid-19: Principal | ICD-10-CM

## 2020-10-15 DIAGNOSIS — E785 Hyperlipidemia, unspecified: Principal | ICD-10-CM

## 2020-10-15 DIAGNOSIS — R627 Adult failure to thrive: Principal | ICD-10-CM

## 2020-10-15 DIAGNOSIS — K529 Noninfective gastroenteritis and colitis, unspecified: Principal | ICD-10-CM

## 2020-10-15 DIAGNOSIS — D61818 Other pancytopenia: Principal | ICD-10-CM

## 2020-10-15 DIAGNOSIS — H9190 Unspecified hearing loss, unspecified ear: Principal | ICD-10-CM

## 2020-10-15 DIAGNOSIS — A319 Mycobacterial infection, unspecified: Principal | ICD-10-CM

## 2020-10-15 DIAGNOSIS — R630 Anorexia: Principal | ICD-10-CM

## 2020-10-15 DIAGNOSIS — J9811 Atelectasis: Principal | ICD-10-CM

## 2020-10-15 DIAGNOSIS — D849 Immunodeficiency, unspecified: Principal | ICD-10-CM

## 2020-10-15 DIAGNOSIS — J9602 Acute respiratory failure with hypercapnia: Principal | ICD-10-CM

## 2020-10-15 DIAGNOSIS — Z941 Heart transplant status: Principal | ICD-10-CM

## 2020-10-15 DIAGNOSIS — D631 Anemia in chronic kidney disease: Principal | ICD-10-CM

## 2020-10-15 DIAGNOSIS — B370 Candidal stomatitis: Principal | ICD-10-CM

## 2020-10-15 DIAGNOSIS — N179 Acute kidney failure, unspecified: Principal | ICD-10-CM

## 2020-10-15 DIAGNOSIS — I5022 Chronic systolic (congestive) heart failure: Principal | ICD-10-CM

## 2020-10-15 DIAGNOSIS — T827XXA Infection and inflammatory reaction due to other cardiac and vascular devices, implants and grafts, initial encounter: Principal | ICD-10-CM

## 2020-10-15 DIAGNOSIS — Z992 Dependence on renal dialysis: Principal | ICD-10-CM

## 2020-10-15 DIAGNOSIS — F329 Major depressive disorder, single episode, unspecified: Principal | ICD-10-CM

## 2020-10-15 DIAGNOSIS — R6881 Early satiety: Principal | ICD-10-CM

## 2020-10-15 DIAGNOSIS — G2581 Restless legs syndrome: Principal | ICD-10-CM

## 2020-10-15 DIAGNOSIS — A4189 Other specified sepsis: Principal | ICD-10-CM

## 2020-10-18 DIAGNOSIS — J9601 Acute respiratory failure with hypoxia: Principal | ICD-10-CM

## 2020-10-18 DIAGNOSIS — Z6821 Body mass index (BMI) 21.0-21.9, adult: Principal | ICD-10-CM

## 2020-10-18 DIAGNOSIS — N186 End stage renal disease: Principal | ICD-10-CM

## 2020-10-18 DIAGNOSIS — I255 Ischemic cardiomyopathy: Principal | ICD-10-CM

## 2020-10-18 DIAGNOSIS — J188 Other pneumonia, unspecified organism: Principal | ICD-10-CM

## 2020-10-18 DIAGNOSIS — R627 Adult failure to thrive: Principal | ICD-10-CM

## 2020-10-18 DIAGNOSIS — A319 Mycobacterial infection, unspecified: Principal | ICD-10-CM

## 2020-10-18 DIAGNOSIS — Z20822 Contact with and (suspected) exposure to covid-19: Principal | ICD-10-CM

## 2020-10-18 DIAGNOSIS — Z7982 Long term (current) use of aspirin: Principal | ICD-10-CM

## 2020-10-18 DIAGNOSIS — F329 Major depressive disorder, single episode, unspecified: Principal | ICD-10-CM

## 2020-10-18 DIAGNOSIS — B370 Candidal stomatitis: Principal | ICD-10-CM

## 2020-10-18 DIAGNOSIS — D631 Anemia in chronic kidney disease: Principal | ICD-10-CM

## 2020-10-18 DIAGNOSIS — R6881 Early satiety: Principal | ICD-10-CM

## 2020-10-18 DIAGNOSIS — D849 Immunodeficiency, unspecified: Principal | ICD-10-CM

## 2020-10-18 DIAGNOSIS — N179 Acute kidney failure, unspecified: Principal | ICD-10-CM

## 2020-10-18 DIAGNOSIS — E43 Unspecified severe protein-calorie malnutrition: Principal | ICD-10-CM

## 2020-10-18 DIAGNOSIS — A4189 Other specified sepsis: Principal | ICD-10-CM

## 2020-10-18 DIAGNOSIS — I132 Hypertensive heart and chronic kidney disease with heart failure and with stage 5 chronic kidney disease, or end stage renal disease: Principal | ICD-10-CM

## 2020-10-18 DIAGNOSIS — J9602 Acute respiratory failure with hypercapnia: Principal | ICD-10-CM

## 2020-10-18 DIAGNOSIS — D500 Iron deficiency anemia secondary to blood loss (chronic): Principal | ICD-10-CM

## 2020-10-18 DIAGNOSIS — H9190 Unspecified hearing loss, unspecified ear: Principal | ICD-10-CM

## 2020-10-18 DIAGNOSIS — Z7901 Long term (current) use of anticoagulants: Principal | ICD-10-CM

## 2020-10-18 DIAGNOSIS — Z941 Heart transplant status: Principal | ICD-10-CM

## 2020-10-18 DIAGNOSIS — D61818 Other pancytopenia: Principal | ICD-10-CM

## 2020-10-18 DIAGNOSIS — R630 Anorexia: Principal | ICD-10-CM

## 2020-10-18 DIAGNOSIS — I5022 Chronic systolic (congestive) heart failure: Principal | ICD-10-CM

## 2020-10-18 DIAGNOSIS — G2581 Restless legs syndrome: Principal | ICD-10-CM

## 2020-10-18 DIAGNOSIS — I2510 Atherosclerotic heart disease of native coronary artery without angina pectoris: Principal | ICD-10-CM

## 2020-10-18 DIAGNOSIS — K529 Noninfective gastroenteritis and colitis, unspecified: Principal | ICD-10-CM

## 2020-10-18 DIAGNOSIS — N2581 Secondary hyperparathyroidism of renal origin: Principal | ICD-10-CM

## 2020-10-18 DIAGNOSIS — E785 Hyperlipidemia, unspecified: Principal | ICD-10-CM

## 2020-10-18 DIAGNOSIS — D689 Coagulation defect, unspecified: Principal | ICD-10-CM

## 2020-10-18 DIAGNOSIS — Z992 Dependence on renal dialysis: Principal | ICD-10-CM

## 2020-10-18 DIAGNOSIS — J9811 Atelectasis: Principal | ICD-10-CM

## 2020-10-18 DIAGNOSIS — T827XXA Infection and inflammatory reaction due to other cardiac and vascular devices, implants and grafts, initial encounter: Principal | ICD-10-CM

## 2020-10-20 DIAGNOSIS — J9811 Atelectasis: Principal | ICD-10-CM

## 2020-10-20 DIAGNOSIS — Z992 Dependence on renal dialysis: Principal | ICD-10-CM

## 2020-10-20 DIAGNOSIS — Z7901 Long term (current) use of anticoagulants: Principal | ICD-10-CM

## 2020-10-20 DIAGNOSIS — I2510 Atherosclerotic heart disease of native coronary artery without angina pectoris: Principal | ICD-10-CM

## 2020-10-20 DIAGNOSIS — N186 End stage renal disease: Principal | ICD-10-CM

## 2020-10-20 DIAGNOSIS — E43 Unspecified severe protein-calorie malnutrition: Principal | ICD-10-CM

## 2020-10-20 DIAGNOSIS — I132 Hypertensive heart and chronic kidney disease with heart failure and with stage 5 chronic kidney disease, or end stage renal disease: Principal | ICD-10-CM

## 2020-10-20 DIAGNOSIS — J9602 Acute respiratory failure with hypercapnia: Principal | ICD-10-CM

## 2020-10-20 DIAGNOSIS — T827XXA Infection and inflammatory reaction due to other cardiac and vascular devices, implants and grafts, initial encounter: Principal | ICD-10-CM

## 2020-10-20 DIAGNOSIS — Z20822 Contact with and (suspected) exposure to covid-19: Principal | ICD-10-CM

## 2020-10-20 DIAGNOSIS — R627 Adult failure to thrive: Principal | ICD-10-CM

## 2020-10-20 DIAGNOSIS — Z7982 Long term (current) use of aspirin: Principal | ICD-10-CM

## 2020-10-20 DIAGNOSIS — J9601 Acute respiratory failure with hypoxia: Principal | ICD-10-CM

## 2020-10-20 DIAGNOSIS — D631 Anemia in chronic kidney disease: Principal | ICD-10-CM

## 2020-10-20 DIAGNOSIS — Z6821 Body mass index (BMI) 21.0-21.9, adult: Principal | ICD-10-CM

## 2020-10-20 DIAGNOSIS — G2581 Restless legs syndrome: Principal | ICD-10-CM

## 2020-10-20 DIAGNOSIS — F329 Major depressive disorder, single episode, unspecified: Principal | ICD-10-CM

## 2020-10-20 DIAGNOSIS — I5022 Chronic systolic (congestive) heart failure: Principal | ICD-10-CM

## 2020-10-20 DIAGNOSIS — H9190 Unspecified hearing loss, unspecified ear: Principal | ICD-10-CM

## 2020-10-20 DIAGNOSIS — R630 Anorexia: Principal | ICD-10-CM

## 2020-10-20 DIAGNOSIS — D61818 Other pancytopenia: Principal | ICD-10-CM

## 2020-10-20 DIAGNOSIS — D849 Immunodeficiency, unspecified: Principal | ICD-10-CM

## 2020-10-20 DIAGNOSIS — D500 Iron deficiency anemia secondary to blood loss (chronic): Principal | ICD-10-CM

## 2020-10-20 DIAGNOSIS — A4189 Other specified sepsis: Principal | ICD-10-CM

## 2020-10-20 DIAGNOSIS — N179 Acute kidney failure, unspecified: Principal | ICD-10-CM

## 2020-10-20 DIAGNOSIS — K529 Noninfective gastroenteritis and colitis, unspecified: Principal | ICD-10-CM

## 2020-10-20 DIAGNOSIS — I255 Ischemic cardiomyopathy: Principal | ICD-10-CM

## 2020-10-20 DIAGNOSIS — N2581 Secondary hyperparathyroidism of renal origin: Principal | ICD-10-CM

## 2020-10-20 DIAGNOSIS — A319 Mycobacterial infection, unspecified: Principal | ICD-10-CM

## 2020-10-20 DIAGNOSIS — D689 Coagulation defect, unspecified: Principal | ICD-10-CM

## 2020-10-20 DIAGNOSIS — R6881 Early satiety: Principal | ICD-10-CM

## 2020-10-20 DIAGNOSIS — J188 Other pneumonia, unspecified organism: Principal | ICD-10-CM

## 2020-10-20 DIAGNOSIS — Z941 Heart transplant status: Principal | ICD-10-CM

## 2020-10-20 DIAGNOSIS — B370 Candidal stomatitis: Principal | ICD-10-CM

## 2020-10-20 DIAGNOSIS — E785 Hyperlipidemia, unspecified: Principal | ICD-10-CM

## 2020-10-22 DIAGNOSIS — N186 End stage renal disease: Principal | ICD-10-CM

## 2020-10-22 DIAGNOSIS — N179 Acute kidney failure, unspecified: Principal | ICD-10-CM

## 2020-10-22 DIAGNOSIS — Z7901 Long term (current) use of anticoagulants: Principal | ICD-10-CM

## 2020-10-22 DIAGNOSIS — A319 Mycobacterial infection, unspecified: Principal | ICD-10-CM

## 2020-10-22 DIAGNOSIS — Z941 Heart transplant status: Principal | ICD-10-CM

## 2020-10-22 DIAGNOSIS — Z6821 Body mass index (BMI) 21.0-21.9, adult: Principal | ICD-10-CM

## 2020-10-22 DIAGNOSIS — D689 Coagulation defect, unspecified: Principal | ICD-10-CM

## 2020-10-22 DIAGNOSIS — R6881 Early satiety: Principal | ICD-10-CM

## 2020-10-22 DIAGNOSIS — D631 Anemia in chronic kidney disease: Principal | ICD-10-CM

## 2020-10-22 DIAGNOSIS — B370 Candidal stomatitis: Principal | ICD-10-CM

## 2020-10-22 DIAGNOSIS — N2581 Secondary hyperparathyroidism of renal origin: Principal | ICD-10-CM

## 2020-10-22 DIAGNOSIS — T827XXA Infection and inflammatory reaction due to other cardiac and vascular devices, implants and grafts, initial encounter: Principal | ICD-10-CM

## 2020-10-22 DIAGNOSIS — K529 Noninfective gastroenteritis and colitis, unspecified: Principal | ICD-10-CM

## 2020-10-22 DIAGNOSIS — Z20822 Contact with and (suspected) exposure to covid-19: Principal | ICD-10-CM

## 2020-10-22 DIAGNOSIS — J9601 Acute respiratory failure with hypoxia: Principal | ICD-10-CM

## 2020-10-22 DIAGNOSIS — I132 Hypertensive heart and chronic kidney disease with heart failure and with stage 5 chronic kidney disease, or end stage renal disease: Principal | ICD-10-CM

## 2020-10-22 DIAGNOSIS — E43 Unspecified severe protein-calorie malnutrition: Principal | ICD-10-CM

## 2020-10-22 DIAGNOSIS — D61818 Other pancytopenia: Principal | ICD-10-CM

## 2020-10-22 DIAGNOSIS — D849 Immunodeficiency, unspecified: Principal | ICD-10-CM

## 2020-10-22 DIAGNOSIS — I5022 Chronic systolic (congestive) heart failure: Principal | ICD-10-CM

## 2020-10-22 DIAGNOSIS — E785 Hyperlipidemia, unspecified: Principal | ICD-10-CM

## 2020-10-22 DIAGNOSIS — J9602 Acute respiratory failure with hypercapnia: Principal | ICD-10-CM

## 2020-10-22 DIAGNOSIS — I2510 Atherosclerotic heart disease of native coronary artery without angina pectoris: Principal | ICD-10-CM

## 2020-10-22 DIAGNOSIS — Z7982 Long term (current) use of aspirin: Principal | ICD-10-CM

## 2020-10-22 DIAGNOSIS — D500 Iron deficiency anemia secondary to blood loss (chronic): Principal | ICD-10-CM

## 2020-10-22 DIAGNOSIS — Z992 Dependence on renal dialysis: Principal | ICD-10-CM

## 2020-10-22 DIAGNOSIS — R627 Adult failure to thrive: Principal | ICD-10-CM

## 2020-10-22 DIAGNOSIS — J9811 Atelectasis: Principal | ICD-10-CM

## 2020-10-22 DIAGNOSIS — G2581 Restless legs syndrome: Principal | ICD-10-CM

## 2020-10-22 DIAGNOSIS — F329 Major depressive disorder, single episode, unspecified: Principal | ICD-10-CM

## 2020-10-22 DIAGNOSIS — I255 Ischemic cardiomyopathy: Principal | ICD-10-CM

## 2020-10-22 DIAGNOSIS — R630 Anorexia: Principal | ICD-10-CM

## 2020-10-22 DIAGNOSIS — J188 Other pneumonia, unspecified organism: Principal | ICD-10-CM

## 2020-10-22 DIAGNOSIS — A4189 Other specified sepsis: Principal | ICD-10-CM

## 2020-10-22 DIAGNOSIS — H9190 Unspecified hearing loss, unspecified ear: Principal | ICD-10-CM

## 2020-10-24 DIAGNOSIS — Z7901 Long term (current) use of anticoagulants: Principal | ICD-10-CM

## 2020-10-24 DIAGNOSIS — J188 Other pneumonia, unspecified organism: Principal | ICD-10-CM

## 2020-10-24 DIAGNOSIS — D849 Immunodeficiency, unspecified: Principal | ICD-10-CM

## 2020-10-24 DIAGNOSIS — N179 Acute kidney failure, unspecified: Principal | ICD-10-CM

## 2020-10-24 DIAGNOSIS — J9811 Atelectasis: Principal | ICD-10-CM

## 2020-10-24 DIAGNOSIS — F329 Major depressive disorder, single episode, unspecified: Principal | ICD-10-CM

## 2020-10-24 DIAGNOSIS — I255 Ischemic cardiomyopathy: Principal | ICD-10-CM

## 2020-10-24 DIAGNOSIS — R6881 Early satiety: Principal | ICD-10-CM

## 2020-10-24 DIAGNOSIS — I132 Hypertensive heart and chronic kidney disease with heart failure and with stage 5 chronic kidney disease, or end stage renal disease: Principal | ICD-10-CM

## 2020-10-24 DIAGNOSIS — Z992 Dependence on renal dialysis: Principal | ICD-10-CM

## 2020-10-24 DIAGNOSIS — T827XXA Infection and inflammatory reaction due to other cardiac and vascular devices, implants and grafts, initial encounter: Principal | ICD-10-CM

## 2020-10-24 DIAGNOSIS — Z941 Heart transplant status: Principal | ICD-10-CM

## 2020-10-24 DIAGNOSIS — G2581 Restless legs syndrome: Principal | ICD-10-CM

## 2020-10-24 DIAGNOSIS — I2510 Atherosclerotic heart disease of native coronary artery without angina pectoris: Principal | ICD-10-CM

## 2020-10-24 DIAGNOSIS — B370 Candidal stomatitis: Principal | ICD-10-CM

## 2020-10-24 DIAGNOSIS — E785 Hyperlipidemia, unspecified: Principal | ICD-10-CM

## 2020-10-24 DIAGNOSIS — Z20822 Contact with and (suspected) exposure to covid-19: Principal | ICD-10-CM

## 2020-10-24 DIAGNOSIS — A4189 Other specified sepsis: Principal | ICD-10-CM

## 2020-10-24 DIAGNOSIS — H9190 Unspecified hearing loss, unspecified ear: Principal | ICD-10-CM

## 2020-10-24 DIAGNOSIS — Z6821 Body mass index (BMI) 21.0-21.9, adult: Principal | ICD-10-CM

## 2020-10-24 DIAGNOSIS — I5022 Chronic systolic (congestive) heart failure: Principal | ICD-10-CM

## 2020-10-24 DIAGNOSIS — D500 Iron deficiency anemia secondary to blood loss (chronic): Principal | ICD-10-CM

## 2020-10-24 DIAGNOSIS — D61818 Other pancytopenia: Principal | ICD-10-CM

## 2020-10-24 DIAGNOSIS — N186 End stage renal disease: Principal | ICD-10-CM

## 2020-10-24 DIAGNOSIS — D689 Coagulation defect, unspecified: Principal | ICD-10-CM

## 2020-10-24 DIAGNOSIS — R630 Anorexia: Principal | ICD-10-CM

## 2020-10-24 DIAGNOSIS — A319 Mycobacterial infection, unspecified: Principal | ICD-10-CM

## 2020-10-24 DIAGNOSIS — Z7982 Long term (current) use of aspirin: Principal | ICD-10-CM

## 2020-10-24 DIAGNOSIS — D631 Anemia in chronic kidney disease: Principal | ICD-10-CM

## 2020-10-24 DIAGNOSIS — R627 Adult failure to thrive: Principal | ICD-10-CM

## 2020-10-24 DIAGNOSIS — E43 Unspecified severe protein-calorie malnutrition: Principal | ICD-10-CM

## 2020-10-24 DIAGNOSIS — J9602 Acute respiratory failure with hypercapnia: Principal | ICD-10-CM

## 2020-10-24 DIAGNOSIS — N2581 Secondary hyperparathyroidism of renal origin: Principal | ICD-10-CM

## 2020-10-24 DIAGNOSIS — J9601 Acute respiratory failure with hypoxia: Principal | ICD-10-CM

## 2020-10-24 DIAGNOSIS — K529 Noninfective gastroenteritis and colitis, unspecified: Principal | ICD-10-CM

## 2020-10-26 MED ORDER — OMADACYCLINE 150 MG TABLET
ORAL_TABLET | Freq: Every day | ORAL | 0 refills | 30.00000 days
Start: 2020-10-26 — End: 2020-10-26

## 2020-10-28 MED ORDER — TEDIZOLID 200 MG TABLET
ORAL_TABLET | Freq: Every day | ORAL | 0 refills | 30 days
Start: 2020-10-28 — End: 2020-10-28

## 2020-10-29 DIAGNOSIS — N2581 Secondary hyperparathyroidism of renal origin: Principal | ICD-10-CM

## 2020-10-29 DIAGNOSIS — Z6821 Body mass index (BMI) 21.0-21.9, adult: Principal | ICD-10-CM

## 2020-10-29 DIAGNOSIS — K529 Noninfective gastroenteritis and colitis, unspecified: Principal | ICD-10-CM

## 2020-10-29 DIAGNOSIS — A319 Mycobacterial infection, unspecified: Principal | ICD-10-CM

## 2020-10-29 DIAGNOSIS — D689 Coagulation defect, unspecified: Principal | ICD-10-CM

## 2020-10-29 DIAGNOSIS — N186 End stage renal disease: Principal | ICD-10-CM

## 2020-10-29 DIAGNOSIS — H9190 Unspecified hearing loss, unspecified ear: Principal | ICD-10-CM

## 2020-10-29 DIAGNOSIS — Z992 Dependence on renal dialysis: Principal | ICD-10-CM

## 2020-10-29 DIAGNOSIS — F329 Major depressive disorder, single episode, unspecified: Principal | ICD-10-CM

## 2020-10-29 DIAGNOSIS — I5022 Chronic systolic (congestive) heart failure: Principal | ICD-10-CM

## 2020-10-29 DIAGNOSIS — I2510 Atherosclerotic heart disease of native coronary artery without angina pectoris: Principal | ICD-10-CM

## 2020-10-29 DIAGNOSIS — Z7901 Long term (current) use of anticoagulants: Principal | ICD-10-CM

## 2020-10-29 DIAGNOSIS — E43 Unspecified severe protein-calorie malnutrition: Principal | ICD-10-CM

## 2020-10-29 DIAGNOSIS — J9811 Atelectasis: Principal | ICD-10-CM

## 2020-10-29 DIAGNOSIS — J9602 Acute respiratory failure with hypercapnia: Principal | ICD-10-CM

## 2020-10-29 DIAGNOSIS — B370 Candidal stomatitis: Principal | ICD-10-CM

## 2020-10-29 DIAGNOSIS — Z7982 Long term (current) use of aspirin: Principal | ICD-10-CM

## 2020-10-29 DIAGNOSIS — I132 Hypertensive heart and chronic kidney disease with heart failure and with stage 5 chronic kidney disease, or end stage renal disease: Principal | ICD-10-CM

## 2020-10-29 DIAGNOSIS — D500 Iron deficiency anemia secondary to blood loss (chronic): Principal | ICD-10-CM

## 2020-10-29 DIAGNOSIS — Z20822 Contact with and (suspected) exposure to covid-19: Principal | ICD-10-CM

## 2020-10-29 DIAGNOSIS — A4189 Other specified sepsis: Principal | ICD-10-CM

## 2020-10-29 DIAGNOSIS — D61818 Other pancytopenia: Principal | ICD-10-CM

## 2020-10-29 DIAGNOSIS — N179 Acute kidney failure, unspecified: Principal | ICD-10-CM

## 2020-10-29 DIAGNOSIS — D631 Anemia in chronic kidney disease: Principal | ICD-10-CM

## 2020-10-29 DIAGNOSIS — R630 Anorexia: Principal | ICD-10-CM

## 2020-10-29 DIAGNOSIS — E785 Hyperlipidemia, unspecified: Principal | ICD-10-CM

## 2020-10-29 DIAGNOSIS — Z941 Heart transplant status: Principal | ICD-10-CM

## 2020-10-29 DIAGNOSIS — R6881 Early satiety: Principal | ICD-10-CM

## 2020-10-29 DIAGNOSIS — I255 Ischemic cardiomyopathy: Principal | ICD-10-CM

## 2020-10-29 DIAGNOSIS — G2581 Restless legs syndrome: Principal | ICD-10-CM

## 2020-10-29 DIAGNOSIS — J188 Other pneumonia, unspecified organism: Principal | ICD-10-CM

## 2020-10-29 DIAGNOSIS — T827XXA Infection and inflammatory reaction due to other cardiac and vascular devices, implants and grafts, initial encounter: Principal | ICD-10-CM

## 2020-10-29 DIAGNOSIS — R627 Adult failure to thrive: Principal | ICD-10-CM

## 2020-10-29 DIAGNOSIS — J9601 Acute respiratory failure with hypoxia: Principal | ICD-10-CM

## 2020-10-29 DIAGNOSIS — D849 Immunodeficiency, unspecified: Principal | ICD-10-CM

## 2020-10-31 DIAGNOSIS — B370 Candidal stomatitis: Principal | ICD-10-CM

## 2020-10-31 DIAGNOSIS — I2510 Atherosclerotic heart disease of native coronary artery without angina pectoris: Principal | ICD-10-CM

## 2020-10-31 DIAGNOSIS — R627 Adult failure to thrive: Principal | ICD-10-CM

## 2020-10-31 DIAGNOSIS — Z941 Heart transplant status: Principal | ICD-10-CM

## 2020-10-31 DIAGNOSIS — N2581 Secondary hyperparathyroidism of renal origin: Principal | ICD-10-CM

## 2020-10-31 DIAGNOSIS — Z992 Dependence on renal dialysis: Principal | ICD-10-CM

## 2020-10-31 DIAGNOSIS — G2581 Restless legs syndrome: Principal | ICD-10-CM

## 2020-10-31 DIAGNOSIS — Z7982 Long term (current) use of aspirin: Principal | ICD-10-CM

## 2020-10-31 DIAGNOSIS — D61818 Other pancytopenia: Principal | ICD-10-CM

## 2020-10-31 DIAGNOSIS — N179 Acute kidney failure, unspecified: Principal | ICD-10-CM

## 2020-10-31 DIAGNOSIS — D849 Immunodeficiency, unspecified: Principal | ICD-10-CM

## 2020-10-31 DIAGNOSIS — J9602 Acute respiratory failure with hypercapnia: Principal | ICD-10-CM

## 2020-10-31 DIAGNOSIS — N186 End stage renal disease: Principal | ICD-10-CM

## 2020-10-31 DIAGNOSIS — E43 Unspecified severe protein-calorie malnutrition: Principal | ICD-10-CM

## 2020-10-31 DIAGNOSIS — I5022 Chronic systolic (congestive) heart failure: Principal | ICD-10-CM

## 2020-10-31 DIAGNOSIS — D631 Anemia in chronic kidney disease: Principal | ICD-10-CM

## 2020-10-31 DIAGNOSIS — A4189 Other specified sepsis: Principal | ICD-10-CM

## 2020-10-31 DIAGNOSIS — Z7901 Long term (current) use of anticoagulants: Principal | ICD-10-CM

## 2020-10-31 DIAGNOSIS — D689 Coagulation defect, unspecified: Principal | ICD-10-CM

## 2020-10-31 DIAGNOSIS — I255 Ischemic cardiomyopathy: Principal | ICD-10-CM

## 2020-10-31 DIAGNOSIS — R630 Anorexia: Principal | ICD-10-CM

## 2020-10-31 DIAGNOSIS — F329 Major depressive disorder, single episode, unspecified: Principal | ICD-10-CM

## 2020-10-31 DIAGNOSIS — Z20822 Contact with and (suspected) exposure to covid-19: Principal | ICD-10-CM

## 2020-10-31 DIAGNOSIS — Z6821 Body mass index (BMI) 21.0-21.9, adult: Principal | ICD-10-CM

## 2020-10-31 DIAGNOSIS — E785 Hyperlipidemia, unspecified: Principal | ICD-10-CM

## 2020-10-31 DIAGNOSIS — I132 Hypertensive heart and chronic kidney disease with heart failure and with stage 5 chronic kidney disease, or end stage renal disease: Principal | ICD-10-CM

## 2020-10-31 DIAGNOSIS — R6881 Early satiety: Principal | ICD-10-CM

## 2020-10-31 DIAGNOSIS — K529 Noninfective gastroenteritis and colitis, unspecified: Principal | ICD-10-CM

## 2020-10-31 DIAGNOSIS — A319 Mycobacterial infection, unspecified: Principal | ICD-10-CM

## 2020-10-31 DIAGNOSIS — J9811 Atelectasis: Principal | ICD-10-CM

## 2020-10-31 DIAGNOSIS — D500 Iron deficiency anemia secondary to blood loss (chronic): Principal | ICD-10-CM

## 2020-10-31 DIAGNOSIS — T827XXA Infection and inflammatory reaction due to other cardiac and vascular devices, implants and grafts, initial encounter: Principal | ICD-10-CM

## 2020-10-31 DIAGNOSIS — J9601 Acute respiratory failure with hypoxia: Principal | ICD-10-CM

## 2020-10-31 DIAGNOSIS — J188 Other pneumonia, unspecified organism: Principal | ICD-10-CM

## 2020-10-31 DIAGNOSIS — H9190 Unspecified hearing loss, unspecified ear: Principal | ICD-10-CM

## 2020-11-03 DIAGNOSIS — G2581 Restless legs syndrome: Principal | ICD-10-CM

## 2020-11-03 DIAGNOSIS — N179 Acute kidney failure, unspecified: Principal | ICD-10-CM

## 2020-11-03 DIAGNOSIS — R630 Anorexia: Principal | ICD-10-CM

## 2020-11-03 DIAGNOSIS — Z992 Dependence on renal dialysis: Principal | ICD-10-CM

## 2020-11-03 DIAGNOSIS — D500 Iron deficiency anemia secondary to blood loss (chronic): Principal | ICD-10-CM

## 2020-11-03 DIAGNOSIS — Z7901 Long term (current) use of anticoagulants: Principal | ICD-10-CM

## 2020-11-03 DIAGNOSIS — N2581 Secondary hyperparathyroidism of renal origin: Principal | ICD-10-CM

## 2020-11-03 DIAGNOSIS — H9190 Unspecified hearing loss, unspecified ear: Principal | ICD-10-CM

## 2020-11-03 DIAGNOSIS — R627 Adult failure to thrive: Principal | ICD-10-CM

## 2020-11-03 DIAGNOSIS — A319 Mycobacterial infection, unspecified: Principal | ICD-10-CM

## 2020-11-03 DIAGNOSIS — B370 Candidal stomatitis: Principal | ICD-10-CM

## 2020-11-03 DIAGNOSIS — T827XXA Infection and inflammatory reaction due to other cardiac and vascular devices, implants and grafts, initial encounter: Principal | ICD-10-CM

## 2020-11-03 DIAGNOSIS — K529 Noninfective gastroenteritis and colitis, unspecified: Principal | ICD-10-CM

## 2020-11-03 DIAGNOSIS — D689 Coagulation defect, unspecified: Principal | ICD-10-CM

## 2020-11-03 DIAGNOSIS — Z941 Heart transplant status: Principal | ICD-10-CM

## 2020-11-03 DIAGNOSIS — N186 End stage renal disease: Principal | ICD-10-CM

## 2020-11-03 DIAGNOSIS — Z20822 Contact with and (suspected) exposure to covid-19: Principal | ICD-10-CM

## 2020-11-03 DIAGNOSIS — F329 Major depressive disorder, single episode, unspecified: Principal | ICD-10-CM

## 2020-11-03 DIAGNOSIS — J9601 Acute respiratory failure with hypoxia: Principal | ICD-10-CM

## 2020-11-03 DIAGNOSIS — I132 Hypertensive heart and chronic kidney disease with heart failure and with stage 5 chronic kidney disease, or end stage renal disease: Principal | ICD-10-CM

## 2020-11-03 DIAGNOSIS — I2510 Atherosclerotic heart disease of native coronary artery without angina pectoris: Principal | ICD-10-CM

## 2020-11-03 DIAGNOSIS — A4189 Other specified sepsis: Principal | ICD-10-CM

## 2020-11-03 DIAGNOSIS — R6881 Early satiety: Principal | ICD-10-CM

## 2020-11-03 DIAGNOSIS — J188 Other pneumonia, unspecified organism: Principal | ICD-10-CM

## 2020-11-03 DIAGNOSIS — I5022 Chronic systolic (congestive) heart failure: Principal | ICD-10-CM

## 2020-11-03 DIAGNOSIS — Z7982 Long term (current) use of aspirin: Principal | ICD-10-CM

## 2020-11-03 DIAGNOSIS — E43 Unspecified severe protein-calorie malnutrition: Principal | ICD-10-CM

## 2020-11-03 DIAGNOSIS — J9602 Acute respiratory failure with hypercapnia: Principal | ICD-10-CM

## 2020-11-03 DIAGNOSIS — I255 Ischemic cardiomyopathy: Principal | ICD-10-CM

## 2020-11-03 DIAGNOSIS — E785 Hyperlipidemia, unspecified: Principal | ICD-10-CM

## 2020-11-03 DIAGNOSIS — Z6821 Body mass index (BMI) 21.0-21.9, adult: Principal | ICD-10-CM

## 2020-11-03 DIAGNOSIS — D61818 Other pancytopenia: Principal | ICD-10-CM

## 2020-11-03 DIAGNOSIS — D849 Immunodeficiency, unspecified: Principal | ICD-10-CM

## 2020-11-03 DIAGNOSIS — D631 Anemia in chronic kidney disease: Principal | ICD-10-CM

## 2020-11-03 DIAGNOSIS — J9811 Atelectasis: Principal | ICD-10-CM

## 2020-11-05 DIAGNOSIS — D849 Immunodeficiency, unspecified: Principal | ICD-10-CM

## 2020-11-05 DIAGNOSIS — B370 Candidal stomatitis: Principal | ICD-10-CM

## 2020-11-05 DIAGNOSIS — Z7982 Long term (current) use of aspirin: Principal | ICD-10-CM

## 2020-11-05 DIAGNOSIS — D61818 Other pancytopenia: Principal | ICD-10-CM

## 2020-11-05 DIAGNOSIS — E43 Unspecified severe protein-calorie malnutrition: Principal | ICD-10-CM

## 2020-11-05 DIAGNOSIS — N2581 Secondary hyperparathyroidism of renal origin: Principal | ICD-10-CM

## 2020-11-05 DIAGNOSIS — K529 Noninfective gastroenteritis and colitis, unspecified: Principal | ICD-10-CM

## 2020-11-05 DIAGNOSIS — R627 Adult failure to thrive: Principal | ICD-10-CM

## 2020-11-05 DIAGNOSIS — N186 End stage renal disease: Principal | ICD-10-CM

## 2020-11-05 DIAGNOSIS — I255 Ischemic cardiomyopathy: Principal | ICD-10-CM

## 2020-11-05 DIAGNOSIS — I2510 Atherosclerotic heart disease of native coronary artery without angina pectoris: Principal | ICD-10-CM

## 2020-11-05 DIAGNOSIS — H9190 Unspecified hearing loss, unspecified ear: Principal | ICD-10-CM

## 2020-11-05 DIAGNOSIS — Z992 Dependence on renal dialysis: Principal | ICD-10-CM

## 2020-11-05 DIAGNOSIS — E785 Hyperlipidemia, unspecified: Principal | ICD-10-CM

## 2020-11-05 DIAGNOSIS — R6881 Early satiety: Principal | ICD-10-CM

## 2020-11-05 DIAGNOSIS — I5022 Chronic systolic (congestive) heart failure: Principal | ICD-10-CM

## 2020-11-05 DIAGNOSIS — I132 Hypertensive heart and chronic kidney disease with heart failure and with stage 5 chronic kidney disease, or end stage renal disease: Principal | ICD-10-CM

## 2020-11-05 DIAGNOSIS — Z20822 Contact with and (suspected) exposure to covid-19: Principal | ICD-10-CM

## 2020-11-05 DIAGNOSIS — J9601 Acute respiratory failure with hypoxia: Principal | ICD-10-CM

## 2020-11-05 DIAGNOSIS — J9811 Atelectasis: Principal | ICD-10-CM

## 2020-11-05 DIAGNOSIS — D689 Coagulation defect, unspecified: Principal | ICD-10-CM

## 2020-11-05 DIAGNOSIS — Z941 Heart transplant status: Principal | ICD-10-CM

## 2020-11-05 DIAGNOSIS — R630 Anorexia: Principal | ICD-10-CM

## 2020-11-05 DIAGNOSIS — A319 Mycobacterial infection, unspecified: Principal | ICD-10-CM

## 2020-11-05 DIAGNOSIS — J188 Other pneumonia, unspecified organism: Principal | ICD-10-CM

## 2020-11-05 DIAGNOSIS — J9602 Acute respiratory failure with hypercapnia: Principal | ICD-10-CM

## 2020-11-05 DIAGNOSIS — F329 Major depressive disorder, single episode, unspecified: Principal | ICD-10-CM

## 2020-11-05 DIAGNOSIS — D500 Iron deficiency anemia secondary to blood loss (chronic): Principal | ICD-10-CM

## 2020-11-05 DIAGNOSIS — A4189 Other specified sepsis: Principal | ICD-10-CM

## 2020-11-05 DIAGNOSIS — Z6821 Body mass index (BMI) 21.0-21.9, adult: Principal | ICD-10-CM

## 2020-11-05 DIAGNOSIS — T827XXA Infection and inflammatory reaction due to other cardiac and vascular devices, implants and grafts, initial encounter: Principal | ICD-10-CM

## 2020-11-05 DIAGNOSIS — G2581 Restless legs syndrome: Principal | ICD-10-CM

## 2020-11-05 DIAGNOSIS — N179 Acute kidney failure, unspecified: Principal | ICD-10-CM

## 2020-11-05 DIAGNOSIS — Z7901 Long term (current) use of anticoagulants: Principal | ICD-10-CM

## 2020-11-05 DIAGNOSIS — D631 Anemia in chronic kidney disease: Principal | ICD-10-CM

## 2020-11-07 DIAGNOSIS — K529 Noninfective gastroenteritis and colitis, unspecified: Principal | ICD-10-CM

## 2020-11-07 DIAGNOSIS — E43 Unspecified severe protein-calorie malnutrition: Principal | ICD-10-CM

## 2020-11-07 DIAGNOSIS — D500 Iron deficiency anemia secondary to blood loss (chronic): Principal | ICD-10-CM

## 2020-11-07 DIAGNOSIS — N179 Acute kidney failure, unspecified: Principal | ICD-10-CM

## 2020-11-07 DIAGNOSIS — J188 Other pneumonia, unspecified organism: Principal | ICD-10-CM

## 2020-11-07 DIAGNOSIS — A4189 Other specified sepsis: Principal | ICD-10-CM

## 2020-11-07 DIAGNOSIS — G2581 Restless legs syndrome: Principal | ICD-10-CM

## 2020-11-07 DIAGNOSIS — N2581 Secondary hyperparathyroidism of renal origin: Principal | ICD-10-CM

## 2020-11-07 DIAGNOSIS — Z941 Heart transplant status: Principal | ICD-10-CM

## 2020-11-07 DIAGNOSIS — J9811 Atelectasis: Principal | ICD-10-CM

## 2020-11-07 DIAGNOSIS — B370 Candidal stomatitis: Principal | ICD-10-CM

## 2020-11-07 DIAGNOSIS — E785 Hyperlipidemia, unspecified: Principal | ICD-10-CM

## 2020-11-07 DIAGNOSIS — T827XXA Infection and inflammatory reaction due to other cardiac and vascular devices, implants and grafts, initial encounter: Principal | ICD-10-CM

## 2020-11-07 DIAGNOSIS — Z7982 Long term (current) use of aspirin: Principal | ICD-10-CM

## 2020-11-07 DIAGNOSIS — H9190 Unspecified hearing loss, unspecified ear: Principal | ICD-10-CM

## 2020-11-07 DIAGNOSIS — J9602 Acute respiratory failure with hypercapnia: Principal | ICD-10-CM

## 2020-11-07 DIAGNOSIS — R627 Adult failure to thrive: Principal | ICD-10-CM

## 2020-11-07 DIAGNOSIS — I2510 Atherosclerotic heart disease of native coronary artery without angina pectoris: Principal | ICD-10-CM

## 2020-11-07 DIAGNOSIS — R630 Anorexia: Principal | ICD-10-CM

## 2020-11-07 DIAGNOSIS — Z7901 Long term (current) use of anticoagulants: Principal | ICD-10-CM

## 2020-11-07 DIAGNOSIS — I255 Ischemic cardiomyopathy: Principal | ICD-10-CM

## 2020-11-07 DIAGNOSIS — R6881 Early satiety: Principal | ICD-10-CM

## 2020-11-07 DIAGNOSIS — Z992 Dependence on renal dialysis: Principal | ICD-10-CM

## 2020-11-07 DIAGNOSIS — D61818 Other pancytopenia: Principal | ICD-10-CM

## 2020-11-07 DIAGNOSIS — N186 End stage renal disease: Principal | ICD-10-CM

## 2020-11-07 DIAGNOSIS — J9601 Acute respiratory failure with hypoxia: Principal | ICD-10-CM

## 2020-11-07 DIAGNOSIS — I5022 Chronic systolic (congestive) heart failure: Principal | ICD-10-CM

## 2020-11-07 DIAGNOSIS — D689 Coagulation defect, unspecified: Principal | ICD-10-CM

## 2020-11-07 DIAGNOSIS — I132 Hypertensive heart and chronic kidney disease with heart failure and with stage 5 chronic kidney disease, or end stage renal disease: Principal | ICD-10-CM

## 2020-11-07 DIAGNOSIS — D631 Anemia in chronic kidney disease: Principal | ICD-10-CM

## 2020-11-07 DIAGNOSIS — Z20822 Contact with and (suspected) exposure to covid-19: Principal | ICD-10-CM

## 2020-11-07 DIAGNOSIS — A319 Mycobacterial infection, unspecified: Principal | ICD-10-CM

## 2020-11-07 DIAGNOSIS — D849 Immunodeficiency, unspecified: Principal | ICD-10-CM

## 2020-11-07 DIAGNOSIS — Z6821 Body mass index (BMI) 21.0-21.9, adult: Principal | ICD-10-CM

## 2020-11-07 DIAGNOSIS — F329 Major depressive disorder, single episode, unspecified: Principal | ICD-10-CM

## 2020-11-10 DIAGNOSIS — H9190 Unspecified hearing loss, unspecified ear: Principal | ICD-10-CM

## 2020-11-10 DIAGNOSIS — G2581 Restless legs syndrome: Principal | ICD-10-CM

## 2020-11-10 DIAGNOSIS — J9601 Acute respiratory failure with hypoxia: Principal | ICD-10-CM

## 2020-11-10 DIAGNOSIS — J9811 Atelectasis: Principal | ICD-10-CM

## 2020-11-10 DIAGNOSIS — E785 Hyperlipidemia, unspecified: Principal | ICD-10-CM

## 2020-11-10 DIAGNOSIS — R6881 Early satiety: Principal | ICD-10-CM

## 2020-11-10 DIAGNOSIS — I2510 Atherosclerotic heart disease of native coronary artery without angina pectoris: Principal | ICD-10-CM

## 2020-11-10 DIAGNOSIS — R627 Adult failure to thrive: Principal | ICD-10-CM

## 2020-11-10 DIAGNOSIS — Z6821 Body mass index (BMI) 21.0-21.9, adult: Principal | ICD-10-CM

## 2020-11-10 DIAGNOSIS — D689 Coagulation defect, unspecified: Principal | ICD-10-CM

## 2020-11-10 DIAGNOSIS — Z7901 Long term (current) use of anticoagulants: Principal | ICD-10-CM

## 2020-11-10 DIAGNOSIS — J9602 Acute respiratory failure with hypercapnia: Principal | ICD-10-CM

## 2020-11-10 DIAGNOSIS — I132 Hypertensive heart and chronic kidney disease with heart failure and with stage 5 chronic kidney disease, or end stage renal disease: Principal | ICD-10-CM

## 2020-11-10 DIAGNOSIS — K529 Noninfective gastroenteritis and colitis, unspecified: Principal | ICD-10-CM

## 2020-11-10 DIAGNOSIS — A4189 Other specified sepsis: Principal | ICD-10-CM

## 2020-11-10 DIAGNOSIS — J188 Other pneumonia, unspecified organism: Principal | ICD-10-CM

## 2020-11-10 DIAGNOSIS — N186 End stage renal disease: Principal | ICD-10-CM

## 2020-11-10 DIAGNOSIS — Z20822 Contact with and (suspected) exposure to covid-19: Principal | ICD-10-CM

## 2020-11-10 DIAGNOSIS — Z7982 Long term (current) use of aspirin: Principal | ICD-10-CM

## 2020-11-10 DIAGNOSIS — N179 Acute kidney failure, unspecified: Principal | ICD-10-CM

## 2020-11-10 DIAGNOSIS — Z941 Heart transplant status: Principal | ICD-10-CM

## 2020-11-10 DIAGNOSIS — D631 Anemia in chronic kidney disease: Principal | ICD-10-CM

## 2020-11-10 DIAGNOSIS — N2581 Secondary hyperparathyroidism of renal origin: Principal | ICD-10-CM

## 2020-11-10 DIAGNOSIS — A319 Mycobacterial infection, unspecified: Principal | ICD-10-CM

## 2020-11-10 DIAGNOSIS — D500 Iron deficiency anemia secondary to blood loss (chronic): Principal | ICD-10-CM

## 2020-11-10 DIAGNOSIS — D849 Immunodeficiency, unspecified: Principal | ICD-10-CM

## 2020-11-10 DIAGNOSIS — R630 Anorexia: Principal | ICD-10-CM

## 2020-11-10 DIAGNOSIS — D61818 Other pancytopenia: Principal | ICD-10-CM

## 2020-11-10 DIAGNOSIS — F329 Major depressive disorder, single episode, unspecified: Principal | ICD-10-CM

## 2020-11-10 DIAGNOSIS — B370 Candidal stomatitis: Principal | ICD-10-CM

## 2020-11-10 DIAGNOSIS — I5022 Chronic systolic (congestive) heart failure: Principal | ICD-10-CM

## 2020-11-10 DIAGNOSIS — T827XXA Infection and inflammatory reaction due to other cardiac and vascular devices, implants and grafts, initial encounter: Principal | ICD-10-CM

## 2020-11-10 DIAGNOSIS — Z992 Dependence on renal dialysis: Principal | ICD-10-CM

## 2020-11-10 DIAGNOSIS — I255 Ischemic cardiomyopathy: Principal | ICD-10-CM

## 2020-11-10 DIAGNOSIS — E43 Unspecified severe protein-calorie malnutrition: Principal | ICD-10-CM

## 2020-11-12 DIAGNOSIS — I5022 Chronic systolic (congestive) heart failure: Principal | ICD-10-CM

## 2020-11-12 DIAGNOSIS — J9602 Acute respiratory failure with hypercapnia: Principal | ICD-10-CM

## 2020-11-12 DIAGNOSIS — A4189 Other specified sepsis: Principal | ICD-10-CM

## 2020-11-12 DIAGNOSIS — Z941 Heart transplant status: Principal | ICD-10-CM

## 2020-11-12 DIAGNOSIS — Z20822 Contact with and (suspected) exposure to covid-19: Principal | ICD-10-CM

## 2020-11-12 DIAGNOSIS — B370 Candidal stomatitis: Principal | ICD-10-CM

## 2020-11-12 DIAGNOSIS — A319 Mycobacterial infection, unspecified: Principal | ICD-10-CM

## 2020-11-12 DIAGNOSIS — Z992 Dependence on renal dialysis: Principal | ICD-10-CM

## 2020-11-12 DIAGNOSIS — J9811 Atelectasis: Principal | ICD-10-CM

## 2020-11-12 DIAGNOSIS — R627 Adult failure to thrive: Principal | ICD-10-CM

## 2020-11-12 DIAGNOSIS — H9190 Unspecified hearing loss, unspecified ear: Principal | ICD-10-CM

## 2020-11-12 DIAGNOSIS — D849 Immunodeficiency, unspecified: Principal | ICD-10-CM

## 2020-11-12 DIAGNOSIS — Z7901 Long term (current) use of anticoagulants: Principal | ICD-10-CM

## 2020-11-12 DIAGNOSIS — D61818 Other pancytopenia: Principal | ICD-10-CM

## 2020-11-12 DIAGNOSIS — R6881 Early satiety: Principal | ICD-10-CM

## 2020-11-12 DIAGNOSIS — Z7982 Long term (current) use of aspirin: Principal | ICD-10-CM

## 2020-11-12 DIAGNOSIS — J188 Other pneumonia, unspecified organism: Principal | ICD-10-CM

## 2020-11-12 DIAGNOSIS — E785 Hyperlipidemia, unspecified: Principal | ICD-10-CM

## 2020-11-12 DIAGNOSIS — I255 Ischemic cardiomyopathy: Principal | ICD-10-CM

## 2020-11-12 DIAGNOSIS — I2510 Atherosclerotic heart disease of native coronary artery without angina pectoris: Principal | ICD-10-CM

## 2020-11-12 DIAGNOSIS — D631 Anemia in chronic kidney disease: Principal | ICD-10-CM

## 2020-11-12 DIAGNOSIS — D500 Iron deficiency anemia secondary to blood loss (chronic): Principal | ICD-10-CM

## 2020-11-12 DIAGNOSIS — Z6821 Body mass index (BMI) 21.0-21.9, adult: Principal | ICD-10-CM

## 2020-11-12 DIAGNOSIS — N179 Acute kidney failure, unspecified: Principal | ICD-10-CM

## 2020-11-12 DIAGNOSIS — T827XXA Infection and inflammatory reaction due to other cardiac and vascular devices, implants and grafts, initial encounter: Principal | ICD-10-CM

## 2020-11-12 DIAGNOSIS — D689 Coagulation defect, unspecified: Principal | ICD-10-CM

## 2020-11-12 DIAGNOSIS — R630 Anorexia: Principal | ICD-10-CM

## 2020-11-12 DIAGNOSIS — I132 Hypertensive heart and chronic kidney disease with heart failure and with stage 5 chronic kidney disease, or end stage renal disease: Principal | ICD-10-CM

## 2020-11-12 DIAGNOSIS — F329 Major depressive disorder, single episode, unspecified: Principal | ICD-10-CM

## 2020-11-12 DIAGNOSIS — N2581 Secondary hyperparathyroidism of renal origin: Principal | ICD-10-CM

## 2020-11-12 DIAGNOSIS — N186 End stage renal disease: Principal | ICD-10-CM

## 2020-11-12 DIAGNOSIS — E43 Unspecified severe protein-calorie malnutrition: Principal | ICD-10-CM

## 2020-11-12 DIAGNOSIS — K529 Noninfective gastroenteritis and colitis, unspecified: Principal | ICD-10-CM

## 2020-11-12 DIAGNOSIS — J9601 Acute respiratory failure with hypoxia: Principal | ICD-10-CM

## 2020-11-12 DIAGNOSIS — G2581 Restless legs syndrome: Principal | ICD-10-CM

## 2020-11-14 DIAGNOSIS — J9602 Acute respiratory failure with hypercapnia: Principal | ICD-10-CM

## 2020-11-14 DIAGNOSIS — T827XXA Infection and inflammatory reaction due to other cardiac and vascular devices, implants and grafts, initial encounter: Principal | ICD-10-CM

## 2020-11-14 DIAGNOSIS — R630 Anorexia: Principal | ICD-10-CM

## 2020-11-14 DIAGNOSIS — Z7982 Long term (current) use of aspirin: Principal | ICD-10-CM

## 2020-11-14 DIAGNOSIS — Z20822 Contact with and (suspected) exposure to covid-19: Principal | ICD-10-CM

## 2020-11-14 DIAGNOSIS — J9811 Atelectasis: Principal | ICD-10-CM

## 2020-11-14 DIAGNOSIS — Z6821 Body mass index (BMI) 21.0-21.9, adult: Principal | ICD-10-CM

## 2020-11-14 DIAGNOSIS — I132 Hypertensive heart and chronic kidney disease with heart failure and with stage 5 chronic kidney disease, or end stage renal disease: Principal | ICD-10-CM

## 2020-11-14 DIAGNOSIS — K529 Noninfective gastroenteritis and colitis, unspecified: Principal | ICD-10-CM

## 2020-11-14 DIAGNOSIS — G2581 Restless legs syndrome: Principal | ICD-10-CM

## 2020-11-14 DIAGNOSIS — N179 Acute kidney failure, unspecified: Principal | ICD-10-CM

## 2020-11-14 DIAGNOSIS — A4189 Other specified sepsis: Principal | ICD-10-CM

## 2020-11-14 DIAGNOSIS — Z7901 Long term (current) use of anticoagulants: Principal | ICD-10-CM

## 2020-11-14 DIAGNOSIS — A319 Mycobacterial infection, unspecified: Principal | ICD-10-CM

## 2020-11-14 DIAGNOSIS — N186 End stage renal disease: Principal | ICD-10-CM

## 2020-11-14 DIAGNOSIS — I255 Ischemic cardiomyopathy: Principal | ICD-10-CM

## 2020-11-14 DIAGNOSIS — D631 Anemia in chronic kidney disease: Principal | ICD-10-CM

## 2020-11-14 DIAGNOSIS — F329 Major depressive disorder, single episode, unspecified: Principal | ICD-10-CM

## 2020-11-14 DIAGNOSIS — H9190 Unspecified hearing loss, unspecified ear: Principal | ICD-10-CM

## 2020-11-14 DIAGNOSIS — N2581 Secondary hyperparathyroidism of renal origin: Principal | ICD-10-CM

## 2020-11-14 DIAGNOSIS — D500 Iron deficiency anemia secondary to blood loss (chronic): Principal | ICD-10-CM

## 2020-11-14 DIAGNOSIS — R627 Adult failure to thrive: Principal | ICD-10-CM

## 2020-11-14 DIAGNOSIS — B370 Candidal stomatitis: Principal | ICD-10-CM

## 2020-11-14 DIAGNOSIS — E43 Unspecified severe protein-calorie malnutrition: Principal | ICD-10-CM

## 2020-11-14 DIAGNOSIS — D61818 Other pancytopenia: Principal | ICD-10-CM

## 2020-11-14 DIAGNOSIS — I5022 Chronic systolic (congestive) heart failure: Principal | ICD-10-CM

## 2020-11-14 DIAGNOSIS — J188 Other pneumonia, unspecified organism: Principal | ICD-10-CM

## 2020-11-14 DIAGNOSIS — J9601 Acute respiratory failure with hypoxia: Principal | ICD-10-CM

## 2020-11-14 DIAGNOSIS — I2510 Atherosclerotic heart disease of native coronary artery without angina pectoris: Principal | ICD-10-CM

## 2020-11-14 DIAGNOSIS — Z941 Heart transplant status: Principal | ICD-10-CM

## 2020-11-14 DIAGNOSIS — Z992 Dependence on renal dialysis: Principal | ICD-10-CM

## 2020-11-14 DIAGNOSIS — D849 Immunodeficiency, unspecified: Principal | ICD-10-CM

## 2020-11-14 DIAGNOSIS — E785 Hyperlipidemia, unspecified: Principal | ICD-10-CM

## 2020-11-14 DIAGNOSIS — D689 Coagulation defect, unspecified: Principal | ICD-10-CM

## 2020-11-14 DIAGNOSIS — R6881 Early satiety: Principal | ICD-10-CM

## 2020-11-17 DIAGNOSIS — N2581 Secondary hyperparathyroidism of renal origin: Principal | ICD-10-CM

## 2020-11-17 DIAGNOSIS — Z6821 Body mass index (BMI) 21.0-21.9, adult: Principal | ICD-10-CM

## 2020-11-17 DIAGNOSIS — Z20822 Contact with and (suspected) exposure to covid-19: Principal | ICD-10-CM

## 2020-11-17 DIAGNOSIS — A319 Mycobacterial infection, unspecified: Principal | ICD-10-CM

## 2020-11-17 DIAGNOSIS — R6881 Early satiety: Principal | ICD-10-CM

## 2020-11-17 DIAGNOSIS — K529 Noninfective gastroenteritis and colitis, unspecified: Principal | ICD-10-CM

## 2020-11-17 DIAGNOSIS — D61818 Other pancytopenia: Principal | ICD-10-CM

## 2020-11-17 DIAGNOSIS — J9602 Acute respiratory failure with hypercapnia: Principal | ICD-10-CM

## 2020-11-17 DIAGNOSIS — D500 Iron deficiency anemia secondary to blood loss (chronic): Principal | ICD-10-CM

## 2020-11-17 DIAGNOSIS — Z941 Heart transplant status: Principal | ICD-10-CM

## 2020-11-17 DIAGNOSIS — N179 Acute kidney failure, unspecified: Principal | ICD-10-CM

## 2020-11-17 DIAGNOSIS — E785 Hyperlipidemia, unspecified: Principal | ICD-10-CM

## 2020-11-17 DIAGNOSIS — I255 Ischemic cardiomyopathy: Principal | ICD-10-CM

## 2020-11-17 DIAGNOSIS — N186 End stage renal disease: Principal | ICD-10-CM

## 2020-11-17 DIAGNOSIS — Z7982 Long term (current) use of aspirin: Principal | ICD-10-CM

## 2020-11-17 DIAGNOSIS — I5022 Chronic systolic (congestive) heart failure: Principal | ICD-10-CM

## 2020-11-17 DIAGNOSIS — B370 Candidal stomatitis: Principal | ICD-10-CM

## 2020-11-17 DIAGNOSIS — J9601 Acute respiratory failure with hypoxia: Principal | ICD-10-CM

## 2020-11-17 DIAGNOSIS — D849 Immunodeficiency, unspecified: Principal | ICD-10-CM

## 2020-11-17 DIAGNOSIS — I132 Hypertensive heart and chronic kidney disease with heart failure and with stage 5 chronic kidney disease, or end stage renal disease: Principal | ICD-10-CM

## 2020-11-17 DIAGNOSIS — Z7901 Long term (current) use of anticoagulants: Principal | ICD-10-CM

## 2020-11-17 DIAGNOSIS — E43 Unspecified severe protein-calorie malnutrition: Principal | ICD-10-CM

## 2020-11-17 DIAGNOSIS — H9190 Unspecified hearing loss, unspecified ear: Principal | ICD-10-CM

## 2020-11-17 DIAGNOSIS — J9811 Atelectasis: Principal | ICD-10-CM

## 2020-11-17 DIAGNOSIS — Z992 Dependence on renal dialysis: Principal | ICD-10-CM

## 2020-11-17 DIAGNOSIS — D631 Anemia in chronic kidney disease: Principal | ICD-10-CM

## 2020-11-17 DIAGNOSIS — G2581 Restless legs syndrome: Principal | ICD-10-CM

## 2020-11-17 DIAGNOSIS — I2510 Atherosclerotic heart disease of native coronary artery without angina pectoris: Principal | ICD-10-CM

## 2020-11-17 DIAGNOSIS — R630 Anorexia: Principal | ICD-10-CM

## 2020-11-17 DIAGNOSIS — R627 Adult failure to thrive: Principal | ICD-10-CM

## 2020-11-17 DIAGNOSIS — T827XXA Infection and inflammatory reaction due to other cardiac and vascular devices, implants and grafts, initial encounter: Principal | ICD-10-CM

## 2020-11-17 DIAGNOSIS — F329 Major depressive disorder, single episode, unspecified: Principal | ICD-10-CM

## 2020-11-17 DIAGNOSIS — A4189 Other specified sepsis: Principal | ICD-10-CM

## 2020-11-17 DIAGNOSIS — D689 Coagulation defect, unspecified: Principal | ICD-10-CM

## 2020-11-17 DIAGNOSIS — J188 Other pneumonia, unspecified organism: Principal | ICD-10-CM

## 2020-11-19 DIAGNOSIS — Z7901 Long term (current) use of anticoagulants: Principal | ICD-10-CM

## 2020-11-19 DIAGNOSIS — A319 Mycobacterial infection, unspecified: Principal | ICD-10-CM

## 2020-11-19 DIAGNOSIS — N179 Acute kidney failure, unspecified: Principal | ICD-10-CM

## 2020-11-19 DIAGNOSIS — Z941 Heart transplant status: Principal | ICD-10-CM

## 2020-11-19 DIAGNOSIS — D631 Anemia in chronic kidney disease: Principal | ICD-10-CM

## 2020-11-19 DIAGNOSIS — N2581 Secondary hyperparathyroidism of renal origin: Principal | ICD-10-CM

## 2020-11-19 DIAGNOSIS — I5022 Chronic systolic (congestive) heart failure: Principal | ICD-10-CM

## 2020-11-19 DIAGNOSIS — D61818 Other pancytopenia: Principal | ICD-10-CM

## 2020-11-19 DIAGNOSIS — J188 Other pneumonia, unspecified organism: Principal | ICD-10-CM

## 2020-11-19 DIAGNOSIS — I132 Hypertensive heart and chronic kidney disease with heart failure and with stage 5 chronic kidney disease, or end stage renal disease: Principal | ICD-10-CM

## 2020-11-19 DIAGNOSIS — Z7982 Long term (current) use of aspirin: Principal | ICD-10-CM

## 2020-11-19 DIAGNOSIS — E785 Hyperlipidemia, unspecified: Principal | ICD-10-CM

## 2020-11-19 DIAGNOSIS — J9811 Atelectasis: Principal | ICD-10-CM

## 2020-11-19 DIAGNOSIS — R630 Anorexia: Principal | ICD-10-CM

## 2020-11-19 DIAGNOSIS — A4189 Other specified sepsis: Principal | ICD-10-CM

## 2020-11-19 DIAGNOSIS — N186 End stage renal disease: Principal | ICD-10-CM

## 2020-11-19 DIAGNOSIS — E43 Unspecified severe protein-calorie malnutrition: Principal | ICD-10-CM

## 2020-11-19 DIAGNOSIS — H9190 Unspecified hearing loss, unspecified ear: Principal | ICD-10-CM

## 2020-11-19 DIAGNOSIS — R6881 Early satiety: Principal | ICD-10-CM

## 2020-11-19 DIAGNOSIS — T827XXA Infection and inflammatory reaction due to other cardiac and vascular devices, implants and grafts, initial encounter: Principal | ICD-10-CM

## 2020-11-19 DIAGNOSIS — R627 Adult failure to thrive: Principal | ICD-10-CM

## 2020-11-19 DIAGNOSIS — K529 Noninfective gastroenteritis and colitis, unspecified: Principal | ICD-10-CM

## 2020-11-19 DIAGNOSIS — D689 Coagulation defect, unspecified: Principal | ICD-10-CM

## 2020-11-19 DIAGNOSIS — G2581 Restless legs syndrome: Principal | ICD-10-CM

## 2020-11-19 DIAGNOSIS — D849 Immunodeficiency, unspecified: Principal | ICD-10-CM

## 2020-11-19 DIAGNOSIS — I255 Ischemic cardiomyopathy: Principal | ICD-10-CM

## 2020-11-19 DIAGNOSIS — D500 Iron deficiency anemia secondary to blood loss (chronic): Principal | ICD-10-CM

## 2020-11-19 DIAGNOSIS — Z992 Dependence on renal dialysis: Principal | ICD-10-CM

## 2020-11-19 DIAGNOSIS — I2510 Atherosclerotic heart disease of native coronary artery without angina pectoris: Principal | ICD-10-CM

## 2020-11-19 DIAGNOSIS — J9602 Acute respiratory failure with hypercapnia: Principal | ICD-10-CM

## 2020-11-19 DIAGNOSIS — Z20822 Contact with and (suspected) exposure to covid-19: Principal | ICD-10-CM

## 2020-11-19 DIAGNOSIS — Z6821 Body mass index (BMI) 21.0-21.9, adult: Principal | ICD-10-CM

## 2020-11-19 DIAGNOSIS — B370 Candidal stomatitis: Principal | ICD-10-CM

## 2020-11-19 DIAGNOSIS — F329 Major depressive disorder, single episode, unspecified: Principal | ICD-10-CM

## 2020-11-19 DIAGNOSIS — J9601 Acute respiratory failure with hypoxia: Principal | ICD-10-CM

## 2020-11-21 DIAGNOSIS — N186 End stage renal disease: Principal | ICD-10-CM

## 2020-11-21 DIAGNOSIS — D631 Anemia in chronic kidney disease: Principal | ICD-10-CM

## 2020-11-21 DIAGNOSIS — J9811 Atelectasis: Principal | ICD-10-CM

## 2020-11-21 DIAGNOSIS — N2581 Secondary hyperparathyroidism of renal origin: Principal | ICD-10-CM

## 2020-11-21 DIAGNOSIS — D61818 Other pancytopenia: Principal | ICD-10-CM

## 2020-11-21 DIAGNOSIS — R630 Anorexia: Principal | ICD-10-CM

## 2020-11-21 DIAGNOSIS — Z20822 Contact with and (suspected) exposure to covid-19: Principal | ICD-10-CM

## 2020-11-21 DIAGNOSIS — E43 Unspecified severe protein-calorie malnutrition: Principal | ICD-10-CM

## 2020-11-21 DIAGNOSIS — Z7901 Long term (current) use of anticoagulants: Principal | ICD-10-CM

## 2020-11-21 DIAGNOSIS — R6881 Early satiety: Principal | ICD-10-CM

## 2020-11-21 DIAGNOSIS — G2581 Restless legs syndrome: Principal | ICD-10-CM

## 2020-11-21 DIAGNOSIS — I5022 Chronic systolic (congestive) heart failure: Principal | ICD-10-CM

## 2020-11-21 DIAGNOSIS — I255 Ischemic cardiomyopathy: Principal | ICD-10-CM

## 2020-11-21 DIAGNOSIS — D849 Immunodeficiency, unspecified: Principal | ICD-10-CM

## 2020-11-21 DIAGNOSIS — B370 Candidal stomatitis: Principal | ICD-10-CM

## 2020-11-21 DIAGNOSIS — J9602 Acute respiratory failure with hypercapnia: Principal | ICD-10-CM

## 2020-11-21 DIAGNOSIS — Z941 Heart transplant status: Principal | ICD-10-CM

## 2020-11-21 DIAGNOSIS — H9190 Unspecified hearing loss, unspecified ear: Principal | ICD-10-CM

## 2020-11-21 DIAGNOSIS — D500 Iron deficiency anemia secondary to blood loss (chronic): Principal | ICD-10-CM

## 2020-11-21 DIAGNOSIS — R627 Adult failure to thrive: Principal | ICD-10-CM

## 2020-11-21 DIAGNOSIS — A4189 Other specified sepsis: Principal | ICD-10-CM

## 2020-11-21 DIAGNOSIS — A319 Mycobacterial infection, unspecified: Principal | ICD-10-CM

## 2020-11-21 DIAGNOSIS — F329 Major depressive disorder, single episode, unspecified: Principal | ICD-10-CM

## 2020-11-21 DIAGNOSIS — J188 Other pneumonia, unspecified organism: Principal | ICD-10-CM

## 2020-11-21 DIAGNOSIS — I132 Hypertensive heart and chronic kidney disease with heart failure and with stage 5 chronic kidney disease, or end stage renal disease: Principal | ICD-10-CM

## 2020-11-21 DIAGNOSIS — K529 Noninfective gastroenteritis and colitis, unspecified: Principal | ICD-10-CM

## 2020-11-21 DIAGNOSIS — Z992 Dependence on renal dialysis: Principal | ICD-10-CM

## 2020-11-21 DIAGNOSIS — I2510 Atherosclerotic heart disease of native coronary artery without angina pectoris: Principal | ICD-10-CM

## 2020-11-21 DIAGNOSIS — Z7982 Long term (current) use of aspirin: Principal | ICD-10-CM

## 2020-11-21 DIAGNOSIS — J9601 Acute respiratory failure with hypoxia: Principal | ICD-10-CM

## 2020-11-21 DIAGNOSIS — E785 Hyperlipidemia, unspecified: Principal | ICD-10-CM

## 2020-11-21 DIAGNOSIS — N179 Acute kidney failure, unspecified: Principal | ICD-10-CM

## 2020-11-21 DIAGNOSIS — T827XXA Infection and inflammatory reaction due to other cardiac and vascular devices, implants and grafts, initial encounter: Principal | ICD-10-CM

## 2020-11-21 DIAGNOSIS — Z6821 Body mass index (BMI) 21.0-21.9, adult: Principal | ICD-10-CM

## 2020-11-21 DIAGNOSIS — D689 Coagulation defect, unspecified: Principal | ICD-10-CM

## 2020-11-24 DIAGNOSIS — Z7901 Long term (current) use of anticoagulants: Principal | ICD-10-CM

## 2020-11-24 DIAGNOSIS — N179 Acute kidney failure, unspecified: Principal | ICD-10-CM

## 2020-11-24 DIAGNOSIS — T827XXA Infection and inflammatory reaction due to other cardiac and vascular devices, implants and grafts, initial encounter: Principal | ICD-10-CM

## 2020-11-24 DIAGNOSIS — J9601 Acute respiratory failure with hypoxia: Principal | ICD-10-CM

## 2020-11-24 DIAGNOSIS — R6881 Early satiety: Principal | ICD-10-CM

## 2020-11-24 DIAGNOSIS — A319 Mycobacterial infection, unspecified: Principal | ICD-10-CM

## 2020-11-24 DIAGNOSIS — K529 Noninfective gastroenteritis and colitis, unspecified: Principal | ICD-10-CM

## 2020-11-24 DIAGNOSIS — J9811 Atelectasis: Principal | ICD-10-CM

## 2020-11-24 DIAGNOSIS — D631 Anemia in chronic kidney disease: Principal | ICD-10-CM

## 2020-11-24 DIAGNOSIS — I132 Hypertensive heart and chronic kidney disease with heart failure and with stage 5 chronic kidney disease, or end stage renal disease: Principal | ICD-10-CM

## 2020-11-24 DIAGNOSIS — D849 Immunodeficiency, unspecified: Principal | ICD-10-CM

## 2020-11-24 DIAGNOSIS — F329 Major depressive disorder, single episode, unspecified: Principal | ICD-10-CM

## 2020-11-24 DIAGNOSIS — Z20822 Contact with and (suspected) exposure to covid-19: Principal | ICD-10-CM

## 2020-11-24 DIAGNOSIS — D61818 Other pancytopenia: Principal | ICD-10-CM

## 2020-11-24 DIAGNOSIS — R627 Adult failure to thrive: Principal | ICD-10-CM

## 2020-11-24 DIAGNOSIS — I255 Ischemic cardiomyopathy: Principal | ICD-10-CM

## 2020-11-24 DIAGNOSIS — J9602 Acute respiratory failure with hypercapnia: Principal | ICD-10-CM

## 2020-11-24 DIAGNOSIS — H9190 Unspecified hearing loss, unspecified ear: Principal | ICD-10-CM

## 2020-11-24 DIAGNOSIS — E785 Hyperlipidemia, unspecified: Principal | ICD-10-CM

## 2020-11-24 DIAGNOSIS — R630 Anorexia: Principal | ICD-10-CM

## 2020-11-24 DIAGNOSIS — J188 Other pneumonia, unspecified organism: Principal | ICD-10-CM

## 2020-11-24 DIAGNOSIS — D500 Iron deficiency anemia secondary to blood loss (chronic): Principal | ICD-10-CM

## 2020-11-24 DIAGNOSIS — N2581 Secondary hyperparathyroidism of renal origin: Principal | ICD-10-CM

## 2020-11-24 DIAGNOSIS — A4189 Other specified sepsis: Principal | ICD-10-CM

## 2020-11-24 DIAGNOSIS — I2510 Atherosclerotic heart disease of native coronary artery without angina pectoris: Principal | ICD-10-CM

## 2020-11-24 DIAGNOSIS — N186 End stage renal disease: Principal | ICD-10-CM

## 2020-11-24 DIAGNOSIS — I5022 Chronic systolic (congestive) heart failure: Principal | ICD-10-CM

## 2020-11-24 DIAGNOSIS — E43 Unspecified severe protein-calorie malnutrition: Principal | ICD-10-CM

## 2020-11-24 DIAGNOSIS — D689 Coagulation defect, unspecified: Principal | ICD-10-CM

## 2020-11-24 DIAGNOSIS — Z992 Dependence on renal dialysis: Principal | ICD-10-CM

## 2020-11-24 DIAGNOSIS — Z7982 Long term (current) use of aspirin: Principal | ICD-10-CM

## 2020-11-24 DIAGNOSIS — Z6821 Body mass index (BMI) 21.0-21.9, adult: Principal | ICD-10-CM

## 2020-11-24 DIAGNOSIS — G2581 Restless legs syndrome: Principal | ICD-10-CM

## 2020-11-24 DIAGNOSIS — Z941 Heart transplant status: Principal | ICD-10-CM

## 2020-11-24 DIAGNOSIS — B370 Candidal stomatitis: Principal | ICD-10-CM

## 2020-11-26 DIAGNOSIS — R6881 Early satiety: Principal | ICD-10-CM

## 2020-11-26 DIAGNOSIS — Z992 Dependence on renal dialysis: Principal | ICD-10-CM

## 2020-11-26 DIAGNOSIS — Z7982 Long term (current) use of aspirin: Principal | ICD-10-CM

## 2020-11-26 DIAGNOSIS — G2581 Restless legs syndrome: Principal | ICD-10-CM

## 2020-11-26 DIAGNOSIS — B370 Candidal stomatitis: Principal | ICD-10-CM

## 2020-11-26 DIAGNOSIS — Z6821 Body mass index (BMI) 21.0-21.9, adult: Principal | ICD-10-CM

## 2020-11-26 DIAGNOSIS — H9190 Unspecified hearing loss, unspecified ear: Principal | ICD-10-CM

## 2020-11-26 DIAGNOSIS — I2510 Atherosclerotic heart disease of native coronary artery without angina pectoris: Principal | ICD-10-CM

## 2020-11-26 DIAGNOSIS — J9602 Acute respiratory failure with hypercapnia: Principal | ICD-10-CM

## 2020-11-26 DIAGNOSIS — A319 Mycobacterial infection, unspecified: Principal | ICD-10-CM

## 2020-11-26 DIAGNOSIS — T827XXA Infection and inflammatory reaction due to other cardiac and vascular devices, implants and grafts, initial encounter: Principal | ICD-10-CM

## 2020-11-26 DIAGNOSIS — R630 Anorexia: Principal | ICD-10-CM

## 2020-11-26 DIAGNOSIS — J9601 Acute respiratory failure with hypoxia: Principal | ICD-10-CM

## 2020-11-26 DIAGNOSIS — E785 Hyperlipidemia, unspecified: Principal | ICD-10-CM

## 2020-11-26 DIAGNOSIS — J188 Other pneumonia, unspecified organism: Principal | ICD-10-CM

## 2020-11-26 DIAGNOSIS — D61818 Other pancytopenia: Principal | ICD-10-CM

## 2020-11-26 DIAGNOSIS — N2581 Secondary hyperparathyroidism of renal origin: Principal | ICD-10-CM

## 2020-11-26 DIAGNOSIS — Z941 Heart transplant status: Principal | ICD-10-CM

## 2020-11-26 DIAGNOSIS — I5022 Chronic systolic (congestive) heart failure: Principal | ICD-10-CM

## 2020-11-26 DIAGNOSIS — Z20822 Contact with and (suspected) exposure to covid-19: Principal | ICD-10-CM

## 2020-11-26 DIAGNOSIS — N186 End stage renal disease: Principal | ICD-10-CM

## 2020-11-26 DIAGNOSIS — I255 Ischemic cardiomyopathy: Principal | ICD-10-CM

## 2020-11-26 DIAGNOSIS — F329 Major depressive disorder, single episode, unspecified: Principal | ICD-10-CM

## 2020-11-26 DIAGNOSIS — I132 Hypertensive heart and chronic kidney disease with heart failure and with stage 5 chronic kidney disease, or end stage renal disease: Principal | ICD-10-CM

## 2020-11-26 DIAGNOSIS — D500 Iron deficiency anemia secondary to blood loss (chronic): Principal | ICD-10-CM

## 2020-11-26 DIAGNOSIS — E43 Unspecified severe protein-calorie malnutrition: Principal | ICD-10-CM

## 2020-11-26 DIAGNOSIS — D631 Anemia in chronic kidney disease: Principal | ICD-10-CM

## 2020-11-26 DIAGNOSIS — J9811 Atelectasis: Principal | ICD-10-CM

## 2020-11-26 DIAGNOSIS — Z7901 Long term (current) use of anticoagulants: Principal | ICD-10-CM

## 2020-11-26 DIAGNOSIS — A4189 Other specified sepsis: Principal | ICD-10-CM

## 2020-11-26 DIAGNOSIS — K529 Noninfective gastroenteritis and colitis, unspecified: Principal | ICD-10-CM

## 2020-11-26 DIAGNOSIS — D689 Coagulation defect, unspecified: Principal | ICD-10-CM

## 2020-11-26 DIAGNOSIS — N179 Acute kidney failure, unspecified: Principal | ICD-10-CM

## 2020-11-26 DIAGNOSIS — R627 Adult failure to thrive: Principal | ICD-10-CM

## 2020-11-26 DIAGNOSIS — D849 Immunodeficiency, unspecified: Principal | ICD-10-CM

## 2020-11-26 MED ORDER — TEDIZOLID 200 MG TABLET
ORAL_TABLET | Freq: Every day | ORAL | 0 refills | 55 days | Status: CP
Start: 2020-11-26 — End: 2021-01-20
  Filled 2020-12-01: qty 30, 30d supply, fill #0

## 2020-11-26 MED ORDER — AZITHROMYCIN 500 MG TABLET
ORAL_TABLET | ORAL | 0 refills | 55 days | Status: CP
Start: 2020-11-26 — End: 2021-01-20
  Filled 2020-12-01: qty 30, 30d supply, fill #0

## 2020-11-26 MED ORDER — OMADACYCLINE 150 MG TABLET
ORAL_TABLET | Freq: Every day | ORAL | 0 refills | 55 days | Status: CP
Start: 2020-11-26 — End: 2021-01-20
  Filled 2020-12-01: qty 60, 30d supply, fill #0

## 2020-11-28 DIAGNOSIS — J9602 Acute respiratory failure with hypercapnia: Principal | ICD-10-CM

## 2020-11-28 DIAGNOSIS — J9601 Acute respiratory failure with hypoxia: Principal | ICD-10-CM

## 2020-11-28 DIAGNOSIS — T827XXA Infection and inflammatory reaction due to other cardiac and vascular devices, implants and grafts, initial encounter: Principal | ICD-10-CM

## 2020-11-28 DIAGNOSIS — Z7901 Long term (current) use of anticoagulants: Principal | ICD-10-CM

## 2020-11-28 DIAGNOSIS — R630 Anorexia: Principal | ICD-10-CM

## 2020-11-28 DIAGNOSIS — F329 Major depressive disorder, single episode, unspecified: Principal | ICD-10-CM

## 2020-11-28 DIAGNOSIS — E43 Unspecified severe protein-calorie malnutrition: Principal | ICD-10-CM

## 2020-11-28 DIAGNOSIS — Z6821 Body mass index (BMI) 21.0-21.9, adult: Principal | ICD-10-CM

## 2020-11-28 DIAGNOSIS — D849 Immunodeficiency, unspecified: Principal | ICD-10-CM

## 2020-11-28 DIAGNOSIS — J9811 Atelectasis: Principal | ICD-10-CM

## 2020-11-28 DIAGNOSIS — B370 Candidal stomatitis: Principal | ICD-10-CM

## 2020-11-28 DIAGNOSIS — N186 End stage renal disease: Principal | ICD-10-CM

## 2020-11-28 DIAGNOSIS — N2581 Secondary hyperparathyroidism of renal origin: Principal | ICD-10-CM

## 2020-11-28 DIAGNOSIS — Z20822 Contact with and (suspected) exposure to covid-19: Principal | ICD-10-CM

## 2020-11-28 DIAGNOSIS — I5022 Chronic systolic (congestive) heart failure: Principal | ICD-10-CM

## 2020-11-28 DIAGNOSIS — D631 Anemia in chronic kidney disease: Principal | ICD-10-CM

## 2020-11-28 DIAGNOSIS — H9190 Unspecified hearing loss, unspecified ear: Principal | ICD-10-CM

## 2020-11-28 DIAGNOSIS — N179 Acute kidney failure, unspecified: Principal | ICD-10-CM

## 2020-11-28 DIAGNOSIS — A4189 Other specified sepsis: Principal | ICD-10-CM

## 2020-11-28 DIAGNOSIS — D61818 Other pancytopenia: Principal | ICD-10-CM

## 2020-11-28 DIAGNOSIS — I2510 Atherosclerotic heart disease of native coronary artery without angina pectoris: Principal | ICD-10-CM

## 2020-11-28 DIAGNOSIS — G2581 Restless legs syndrome: Principal | ICD-10-CM

## 2020-11-28 DIAGNOSIS — Z941 Heart transplant status: Principal | ICD-10-CM

## 2020-11-28 DIAGNOSIS — I132 Hypertensive heart and chronic kidney disease with heart failure and with stage 5 chronic kidney disease, or end stage renal disease: Principal | ICD-10-CM

## 2020-11-28 DIAGNOSIS — I255 Ischemic cardiomyopathy: Principal | ICD-10-CM

## 2020-11-28 DIAGNOSIS — R6881 Early satiety: Principal | ICD-10-CM

## 2020-11-28 DIAGNOSIS — Z7982 Long term (current) use of aspirin: Principal | ICD-10-CM

## 2020-11-28 DIAGNOSIS — D500 Iron deficiency anemia secondary to blood loss (chronic): Principal | ICD-10-CM

## 2020-11-28 DIAGNOSIS — Z992 Dependence on renal dialysis: Principal | ICD-10-CM

## 2020-11-28 DIAGNOSIS — R627 Adult failure to thrive: Principal | ICD-10-CM

## 2020-11-28 DIAGNOSIS — A319 Mycobacterial infection, unspecified: Principal | ICD-10-CM

## 2020-11-28 DIAGNOSIS — K529 Noninfective gastroenteritis and colitis, unspecified: Principal | ICD-10-CM

## 2020-11-28 DIAGNOSIS — J188 Other pneumonia, unspecified organism: Principal | ICD-10-CM

## 2020-11-28 DIAGNOSIS — E785 Hyperlipidemia, unspecified: Principal | ICD-10-CM

## 2020-11-28 DIAGNOSIS — D689 Coagulation defect, unspecified: Principal | ICD-10-CM

## 2020-11-30 DIAGNOSIS — B370 Candidal stomatitis: Principal | ICD-10-CM

## 2020-11-30 DIAGNOSIS — A319 Mycobacterial infection, unspecified: Principal | ICD-10-CM

## 2020-11-30 DIAGNOSIS — N2581 Secondary hyperparathyroidism of renal origin: Principal | ICD-10-CM

## 2020-11-30 DIAGNOSIS — N186 End stage renal disease: Principal | ICD-10-CM

## 2020-11-30 DIAGNOSIS — E43 Unspecified severe protein-calorie malnutrition: Principal | ICD-10-CM

## 2020-11-30 DIAGNOSIS — Z7901 Long term (current) use of anticoagulants: Principal | ICD-10-CM

## 2020-11-30 DIAGNOSIS — J188 Other pneumonia, unspecified organism: Principal | ICD-10-CM

## 2020-11-30 DIAGNOSIS — E785 Hyperlipidemia, unspecified: Principal | ICD-10-CM

## 2020-11-30 DIAGNOSIS — Z20822 Contact with and (suspected) exposure to covid-19: Principal | ICD-10-CM

## 2020-11-30 DIAGNOSIS — G2581 Restless legs syndrome: Principal | ICD-10-CM

## 2020-11-30 DIAGNOSIS — J9811 Atelectasis: Principal | ICD-10-CM

## 2020-11-30 DIAGNOSIS — D849 Immunodeficiency, unspecified: Principal | ICD-10-CM

## 2020-11-30 DIAGNOSIS — J9602 Acute respiratory failure with hypercapnia: Principal | ICD-10-CM

## 2020-11-30 DIAGNOSIS — R630 Anorexia: Principal | ICD-10-CM

## 2020-11-30 DIAGNOSIS — I132 Hypertensive heart and chronic kidney disease with heart failure and with stage 5 chronic kidney disease, or end stage renal disease: Principal | ICD-10-CM

## 2020-11-30 DIAGNOSIS — Z7982 Long term (current) use of aspirin: Principal | ICD-10-CM

## 2020-11-30 DIAGNOSIS — I2510 Atherosclerotic heart disease of native coronary artery without angina pectoris: Principal | ICD-10-CM

## 2020-11-30 DIAGNOSIS — D61818 Other pancytopenia: Principal | ICD-10-CM

## 2020-11-30 DIAGNOSIS — Z992 Dependence on renal dialysis: Principal | ICD-10-CM

## 2020-11-30 DIAGNOSIS — A4189 Other specified sepsis: Principal | ICD-10-CM

## 2020-11-30 DIAGNOSIS — I5022 Chronic systolic (congestive) heart failure: Principal | ICD-10-CM

## 2020-11-30 DIAGNOSIS — J9601 Acute respiratory failure with hypoxia: Principal | ICD-10-CM

## 2020-11-30 DIAGNOSIS — D689 Coagulation defect, unspecified: Principal | ICD-10-CM

## 2020-11-30 DIAGNOSIS — Z941 Heart transplant status: Principal | ICD-10-CM

## 2020-11-30 DIAGNOSIS — Z6821 Body mass index (BMI) 21.0-21.9, adult: Principal | ICD-10-CM

## 2020-11-30 DIAGNOSIS — R627 Adult failure to thrive: Principal | ICD-10-CM

## 2020-11-30 DIAGNOSIS — R6881 Early satiety: Principal | ICD-10-CM

## 2020-11-30 DIAGNOSIS — H9190 Unspecified hearing loss, unspecified ear: Principal | ICD-10-CM

## 2020-11-30 DIAGNOSIS — D631 Anemia in chronic kidney disease: Principal | ICD-10-CM

## 2020-11-30 DIAGNOSIS — N179 Acute kidney failure, unspecified: Principal | ICD-10-CM

## 2020-11-30 DIAGNOSIS — F329 Major depressive disorder, single episode, unspecified: Principal | ICD-10-CM

## 2020-11-30 DIAGNOSIS — D500 Iron deficiency anemia secondary to blood loss (chronic): Principal | ICD-10-CM

## 2020-11-30 DIAGNOSIS — T827XXA Infection and inflammatory reaction due to other cardiac and vascular devices, implants and grafts, initial encounter: Principal | ICD-10-CM

## 2020-11-30 DIAGNOSIS — K529 Noninfective gastroenteritis and colitis, unspecified: Principal | ICD-10-CM

## 2020-11-30 DIAGNOSIS — I255 Ischemic cardiomyopathy: Principal | ICD-10-CM

## 2020-12-01 DIAGNOSIS — T86298 Other complications of heart transplant: Principal | ICD-10-CM

## 2020-12-01 MED ORDER — DILTIAZEM CD 120 MG CAPSULE,EXTENDED RELEASE 24 HR
ORAL_CAPSULE | Freq: Every day | ORAL | 3 refills | 90 days | Status: CP
Start: 2020-12-01 — End: 2021-12-01
  Filled 2020-12-01: qty 30, 30d supply, fill #0

## 2020-12-01 MED ORDER — TORSEMIDE 100 MG TABLET
ORAL_TABLET | ORAL | 2 refills | 53 days | Status: CP
Start: 2020-12-01 — End: ?

## 2020-12-01 MED ORDER — TORSEMIDE 20 MG TABLET
ORAL_TABLET | 11 refills | 0 days | Status: CP
Start: 2020-12-01 — End: 2020-12-01
  Filled 2020-12-01: qty 30, 28d supply, fill #0
  Filled 2020-12-01: qty 48, 28d supply, fill #0

## 2020-12-01 MED ORDER — MIDODRINE 5 MG TABLET
ORAL_TABLET | Freq: Every day | ORAL | 2 refills | 30 days | Status: CP | PRN
Start: 2020-12-01 — End: ?
  Filled 2020-12-01: qty 30, 30d supply, fill #0

## 2020-12-01 MED ORDER — TACROLIMUS 1 MG CAPSULE, IMMEDIATE-RELEASE
ORAL_CAPSULE | 11 refills | 0 days
Start: 2020-12-01 — End: 2020-12-08

## 2020-12-01 MED ORDER — MAGNESIUM OXIDE 400 MG (241.3 MG MAGNESIUM) TABLET
ORAL_TABLET | Freq: Every day | ORAL | 11 refills | 120 days | Status: CP
Start: 2020-12-01 — End: 2021-12-01
  Filled 2020-12-01: qty 120, 120d supply, fill #0

## 2020-12-01 MED ORDER — MIRTAZAPINE 45 MG TABLET
ORAL_TABLET | Freq: Every evening | ORAL | 2 refills | 30 days | Status: CP
Start: 2020-12-01 — End: 2021-03-01
  Filled 2020-12-01: qty 30, 30d supply, fill #0

## 2020-12-01 MED ORDER — MYCOPHENOLATE SODIUM 180 MG TABLET,DELAYED RELEASE
ORAL_TABLET | Freq: Two times a day (BID) | ORAL | 11 refills | 30 days | Status: CP
Start: 2020-12-01 — End: 2020-12-10
  Filled 2020-12-01: qty 60, 30d supply, fill #0

## 2020-12-02 DIAGNOSIS — Z6821 Body mass index (BMI) 21.0-21.9, adult: Principal | ICD-10-CM

## 2020-12-02 DIAGNOSIS — I2510 Atherosclerotic heart disease of native coronary artery without angina pectoris: Principal | ICD-10-CM

## 2020-12-02 DIAGNOSIS — J9601 Acute respiratory failure with hypoxia: Principal | ICD-10-CM

## 2020-12-02 DIAGNOSIS — G2581 Restless legs syndrome: Principal | ICD-10-CM

## 2020-12-02 DIAGNOSIS — D500 Iron deficiency anemia secondary to blood loss (chronic): Principal | ICD-10-CM

## 2020-12-02 DIAGNOSIS — R6881 Early satiety: Principal | ICD-10-CM

## 2020-12-02 DIAGNOSIS — B370 Candidal stomatitis: Principal | ICD-10-CM

## 2020-12-02 DIAGNOSIS — D631 Anemia in chronic kidney disease: Principal | ICD-10-CM

## 2020-12-02 DIAGNOSIS — R627 Adult failure to thrive: Principal | ICD-10-CM

## 2020-12-02 DIAGNOSIS — D689 Coagulation defect, unspecified: Principal | ICD-10-CM

## 2020-12-02 DIAGNOSIS — Z941 Heart transplant status: Principal | ICD-10-CM

## 2020-12-02 DIAGNOSIS — A319 Mycobacterial infection, unspecified: Principal | ICD-10-CM

## 2020-12-02 DIAGNOSIS — J9602 Acute respiratory failure with hypercapnia: Principal | ICD-10-CM

## 2020-12-02 DIAGNOSIS — J9811 Atelectasis: Principal | ICD-10-CM

## 2020-12-02 DIAGNOSIS — T827XXA Infection and inflammatory reaction due to other cardiac and vascular devices, implants and grafts, initial encounter: Principal | ICD-10-CM

## 2020-12-02 DIAGNOSIS — E785 Hyperlipidemia, unspecified: Principal | ICD-10-CM

## 2020-12-02 DIAGNOSIS — N2581 Secondary hyperparathyroidism of renal origin: Principal | ICD-10-CM

## 2020-12-02 DIAGNOSIS — D61818 Other pancytopenia: Principal | ICD-10-CM

## 2020-12-02 DIAGNOSIS — I255 Ischemic cardiomyopathy: Principal | ICD-10-CM

## 2020-12-02 DIAGNOSIS — F329 Major depressive disorder, single episode, unspecified: Principal | ICD-10-CM

## 2020-12-02 DIAGNOSIS — N179 Acute kidney failure, unspecified: Principal | ICD-10-CM

## 2020-12-02 DIAGNOSIS — D849 Immunodeficiency, unspecified: Principal | ICD-10-CM

## 2020-12-02 DIAGNOSIS — E43 Unspecified severe protein-calorie malnutrition: Principal | ICD-10-CM

## 2020-12-02 DIAGNOSIS — H9190 Unspecified hearing loss, unspecified ear: Principal | ICD-10-CM

## 2020-12-02 DIAGNOSIS — R630 Anorexia: Principal | ICD-10-CM

## 2020-12-02 DIAGNOSIS — Z20822 Contact with and (suspected) exposure to covid-19: Principal | ICD-10-CM

## 2020-12-02 DIAGNOSIS — Z7982 Long term (current) use of aspirin: Principal | ICD-10-CM

## 2020-12-02 DIAGNOSIS — N186 End stage renal disease: Principal | ICD-10-CM

## 2020-12-02 DIAGNOSIS — K529 Noninfective gastroenteritis and colitis, unspecified: Principal | ICD-10-CM

## 2020-12-02 DIAGNOSIS — A4189 Other specified sepsis: Principal | ICD-10-CM

## 2020-12-02 DIAGNOSIS — Z992 Dependence on renal dialysis: Principal | ICD-10-CM

## 2020-12-02 DIAGNOSIS — J188 Other pneumonia, unspecified organism: Principal | ICD-10-CM

## 2020-12-02 DIAGNOSIS — I5022 Chronic systolic (congestive) heart failure: Principal | ICD-10-CM

## 2020-12-02 DIAGNOSIS — Z7901 Long term (current) use of anticoagulants: Principal | ICD-10-CM

## 2020-12-02 DIAGNOSIS — I132 Hypertensive heart and chronic kidney disease with heart failure and with stage 5 chronic kidney disease, or end stage renal disease: Principal | ICD-10-CM

## 2020-12-03 DIAGNOSIS — B370 Candidal stomatitis: Principal | ICD-10-CM

## 2020-12-03 DIAGNOSIS — Z7982 Long term (current) use of aspirin: Principal | ICD-10-CM

## 2020-12-03 DIAGNOSIS — Z941 Heart transplant status: Principal | ICD-10-CM

## 2020-12-03 DIAGNOSIS — N186 End stage renal disease: Principal | ICD-10-CM

## 2020-12-03 DIAGNOSIS — F329 Major depressive disorder, single episode, unspecified: Principal | ICD-10-CM

## 2020-12-03 DIAGNOSIS — Z7901 Long term (current) use of anticoagulants: Principal | ICD-10-CM

## 2020-12-03 DIAGNOSIS — R630 Anorexia: Principal | ICD-10-CM

## 2020-12-03 DIAGNOSIS — G2581 Restless legs syndrome: Principal | ICD-10-CM

## 2020-12-03 DIAGNOSIS — J9601 Acute respiratory failure with hypoxia: Principal | ICD-10-CM

## 2020-12-03 DIAGNOSIS — Z6821 Body mass index (BMI) 21.0-21.9, adult: Principal | ICD-10-CM

## 2020-12-03 DIAGNOSIS — R6881 Early satiety: Principal | ICD-10-CM

## 2020-12-03 DIAGNOSIS — D631 Anemia in chronic kidney disease: Principal | ICD-10-CM

## 2020-12-03 DIAGNOSIS — D500 Iron deficiency anemia secondary to blood loss (chronic): Principal | ICD-10-CM

## 2020-12-03 DIAGNOSIS — A319 Mycobacterial infection, unspecified: Principal | ICD-10-CM

## 2020-12-03 DIAGNOSIS — Z992 Dependence on renal dialysis: Principal | ICD-10-CM

## 2020-12-03 DIAGNOSIS — D849 Immunodeficiency, unspecified: Principal | ICD-10-CM

## 2020-12-03 DIAGNOSIS — I5022 Chronic systolic (congestive) heart failure: Principal | ICD-10-CM

## 2020-12-03 DIAGNOSIS — I255 Ischemic cardiomyopathy: Principal | ICD-10-CM

## 2020-12-03 DIAGNOSIS — J188 Other pneumonia, unspecified organism: Principal | ICD-10-CM

## 2020-12-03 DIAGNOSIS — N2581 Secondary hyperparathyroidism of renal origin: Principal | ICD-10-CM

## 2020-12-03 DIAGNOSIS — H9190 Unspecified hearing loss, unspecified ear: Principal | ICD-10-CM

## 2020-12-03 DIAGNOSIS — Z20822 Contact with and (suspected) exposure to covid-19: Principal | ICD-10-CM

## 2020-12-03 DIAGNOSIS — D689 Coagulation defect, unspecified: Principal | ICD-10-CM

## 2020-12-03 DIAGNOSIS — D61818 Other pancytopenia: Principal | ICD-10-CM

## 2020-12-03 DIAGNOSIS — R627 Adult failure to thrive: Principal | ICD-10-CM

## 2020-12-03 DIAGNOSIS — J9811 Atelectasis: Principal | ICD-10-CM

## 2020-12-03 DIAGNOSIS — N179 Acute kidney failure, unspecified: Principal | ICD-10-CM

## 2020-12-03 DIAGNOSIS — T827XXA Infection and inflammatory reaction due to other cardiac and vascular devices, implants and grafts, initial encounter: Principal | ICD-10-CM

## 2020-12-03 DIAGNOSIS — E43 Unspecified severe protein-calorie malnutrition: Principal | ICD-10-CM

## 2020-12-03 DIAGNOSIS — A4189 Other specified sepsis: Principal | ICD-10-CM

## 2020-12-03 DIAGNOSIS — K529 Noninfective gastroenteritis and colitis, unspecified: Principal | ICD-10-CM

## 2020-12-03 DIAGNOSIS — I132 Hypertensive heart and chronic kidney disease with heart failure and with stage 5 chronic kidney disease, or end stage renal disease: Principal | ICD-10-CM

## 2020-12-03 DIAGNOSIS — I2510 Atherosclerotic heart disease of native coronary artery without angina pectoris: Principal | ICD-10-CM

## 2020-12-03 DIAGNOSIS — J9602 Acute respiratory failure with hypercapnia: Principal | ICD-10-CM

## 2020-12-03 DIAGNOSIS — E785 Hyperlipidemia, unspecified: Principal | ICD-10-CM

## 2020-12-04 DIAGNOSIS — Z992 Dependence on renal dialysis: Principal | ICD-10-CM

## 2020-12-07 DIAGNOSIS — Z20828 Contact with and (suspected) exposure to other viral communicable diseases: Principal | ICD-10-CM

## 2020-12-07 DIAGNOSIS — B259 Cytomegaloviral disease, unspecified: Principal | ICD-10-CM

## 2020-12-07 DIAGNOSIS — T86298 Other complications of heart transplant: Principal | ICD-10-CM

## 2020-12-07 DIAGNOSIS — Z941 Heart transplant status: Principal | ICD-10-CM

## 2020-12-08 ENCOUNTER — Ambulatory Visit
Admit: 2020-12-08 | Discharge: 2020-12-08 | Payer: PRIVATE HEALTH INSURANCE | Attending: Infectious Disease | Primary: Infectious Disease

## 2020-12-08 ENCOUNTER — Ambulatory Visit: Admit: 2020-12-08 | Discharge: 2020-12-08 | Payer: PRIVATE HEALTH INSURANCE

## 2020-12-08 ENCOUNTER — Ambulatory Visit
Admit: 2020-12-08 | Discharge: 2020-12-08 | Payer: PRIVATE HEALTH INSURANCE | Attending: Adult Health | Primary: Adult Health

## 2020-12-08 DIAGNOSIS — Z8616 Personal history of COVID-19: Principal | ICD-10-CM

## 2020-12-08 DIAGNOSIS — N186 End stage renal disease: Principal | ICD-10-CM

## 2020-12-08 DIAGNOSIS — Z4821 Encounter for aftercare following heart transplant: Principal | ICD-10-CM

## 2020-12-08 DIAGNOSIS — D849 Immunodeficiency, unspecified: Principal | ICD-10-CM

## 2020-12-08 DIAGNOSIS — B999 Unspecified infectious disease: Principal | ICD-10-CM

## 2020-12-08 DIAGNOSIS — I255 Ischemic cardiomyopathy: Principal | ICD-10-CM

## 2020-12-08 DIAGNOSIS — B259 Cytomegaloviral disease, unspecified: Principal | ICD-10-CM

## 2020-12-08 DIAGNOSIS — I2510 Atherosclerotic heart disease of native coronary artery without angina pectoris: Principal | ICD-10-CM

## 2020-12-08 DIAGNOSIS — Z79899 Other long term (current) drug therapy: Principal | ICD-10-CM

## 2020-12-08 DIAGNOSIS — I5022 Chronic systolic (congestive) heart failure: Principal | ICD-10-CM

## 2020-12-08 DIAGNOSIS — Z992 Dependence on renal dialysis: Principal | ICD-10-CM

## 2020-12-08 DIAGNOSIS — Z0000 Encounter for general adult medical examination without abnormal findings: Principal | ICD-10-CM

## 2020-12-08 DIAGNOSIS — Z20828 Contact with and (suspected) exposure to other viral communicable diseases: Principal | ICD-10-CM

## 2020-12-08 DIAGNOSIS — D631 Anemia in chronic kidney disease: Principal | ICD-10-CM

## 2020-12-08 DIAGNOSIS — Z941 Heart transplant status: Principal | ICD-10-CM

## 2020-12-08 DIAGNOSIS — R0600 Dyspnea, unspecified: Principal | ICD-10-CM

## 2020-12-08 DIAGNOSIS — I132 Hypertensive heart and chronic kidney disease with heart failure and with stage 5 chronic kidney disease, or end stage renal disease: Principal | ICD-10-CM

## 2020-12-08 DIAGNOSIS — E785 Hyperlipidemia, unspecified: Principal | ICD-10-CM

## 2020-12-08 DIAGNOSIS — T86298 Other complications of heart transplant: Principal | ICD-10-CM

## 2020-12-08 DIAGNOSIS — A319 Mycobacterial infection, unspecified: Principal | ICD-10-CM

## 2020-12-08 MED ORDER — TACROLIMUS 5 MG CAPSULE, IMMEDIATE-RELEASE
ORAL_CAPSULE | ORAL | 2 refills | 0.00000 days | Status: CP
Start: 2020-12-08 — End: 2020-12-10

## 2020-12-08 MED ORDER — TACROLIMUS 1 MG CAPSULE, IMMEDIATE-RELEASE
ORAL_CAPSULE | ORAL | 2 refills | 0.00000 days | Status: CP
Start: 2020-12-08 — End: 2020-12-10

## 2020-12-10 DIAGNOSIS — Z941 Heart transplant status: Principal | ICD-10-CM

## 2020-12-10 DIAGNOSIS — T86298 Other complications of heart transplant: Principal | ICD-10-CM

## 2020-12-10 MED ORDER — MYCOPHENOLATE SODIUM 180 MG TABLET,DELAYED RELEASE
ORAL_TABLET | Freq: Two times a day (BID) | ORAL | 11 refills | 30 days | Status: CP
Start: 2020-12-10 — End: ?

## 2020-12-10 MED ORDER — TACROLIMUS 1 MG CAPSULE, IMMEDIATE-RELEASE
ORAL_CAPSULE | 3 refills | 0 days | Status: CP
Start: 2020-12-10 — End: ?

## 2020-12-10 MED ORDER — TACROLIMUS 5 MG CAPSULE, IMMEDIATE-RELEASE
ORAL_CAPSULE | 3 refills | 0 days | Status: CP
Start: 2020-12-10 — End: ?

## 2020-12-11 MED FILL — PANTOPRAZOLE 40 MG TABLET,DELAYED RELEASE: ORAL | 30 days supply | Qty: 60 | Fill #1

## 2020-12-15 DIAGNOSIS — J9601 Acute respiratory failure with hypoxia: Secondary | ICD-10-CM | POA: Diagnosis present

## 2020-12-17 DIAGNOSIS — T86298 Other complications of heart transplant: Principal | ICD-10-CM

## 2020-12-18 DIAGNOSIS — T86298 Other complications of heart transplant: Principal | ICD-10-CM

## 2020-12-18 MED ORDER — MYCOPHENOLATE SODIUM 180 MG TABLET,DELAYED RELEASE
ORAL_TABLET | Freq: Two times a day (BID) | ORAL | 11 refills | 30.00000 days
Start: 2020-12-18 — End: ?

## 2020-12-18 NOTE — Unmapped (Signed)
[facility] Specialty Medication Onboarding    Specialty Medication: Riki Altes  Prior Authorization: Not Required   Financial Assistance: Patient previously paid $50 at [facility]  Final Copay/Day Supply: $50 / 24 (partial qty remaining on rx. Will only be able to fill 48 of 50 remaining tabs)    Insurance Restrictions: Yes - max 1 month supply      Specialty Medication: Sivextro  Prior Authorization: Not Required   Financial Assistance: Patient previously paid $50 at [facility]  Final Copay/Day Supply: $50 / 25    Insurance Restrictions: Yes - max 1 month supply     Notes to Pharmacist: Azithromycin $10, atorvastatin $0, mirtazapine rfts 03/03, torsemide rfts 03/01    The triage team has completed the benefits investigation and has determined that the patient is able to fill this medication at [facility]. Please contact the patient to complete the onboarding or follow up with the prescribing physician as needed.

## 2020-12-21 ENCOUNTER — Other Ambulatory Visit (HOSPITAL_COMMUNITY)
Admission: AD | Admit: 2020-12-21 | Discharge: 2020-12-21 | Disposition: A | Discharge status: Home / Self Care | Payer: 59 | Source: Ambulatory Visit | Attending: Adult Health Nurse Practitioner | Admitting: Adult Health Nurse Practitioner

## 2020-12-21 DIAGNOSIS — Z941 Heart transplant status: Secondary | ICD-10-CM | POA: Diagnosis not present

## 2020-12-21 DIAGNOSIS — Z48298 Encounter for aftercare following other organ transplant: Secondary | ICD-10-CM | POA: Diagnosis not present

## 2020-12-21 DIAGNOSIS — B259 Cytomegaloviral disease, unspecified: Secondary | ICD-10-CM | POA: Diagnosis not present

## 2020-12-21 DIAGNOSIS — R7989 Other specified abnormal findings of blood chemistry: Secondary | ICD-10-CM | POA: Insufficient documentation

## 2020-12-21 DIAGNOSIS — Z79899 Other long term (current) drug therapy: Secondary | ICD-10-CM | POA: Insufficient documentation

## 2020-12-21 LAB — CBC WITH DIFFERENTIAL/PLATELET
Abs Immature Granulocytes: 0.04 10*3/uL (ref 0.00–0.07)
Basophils Absolute: 0 10*3/uL (ref 0.0–0.1)
Basophils Relative: 0 %
Eosinophils Absolute: 0.7 10*3/uL — ABNORMAL HIGH (ref 0.0–0.5)
Eosinophils Relative: 10 %
HCT: 38.2 % — ABNORMAL LOW (ref 39.0–52.0)
Hemoglobin: 11.6 g/dL — ABNORMAL LOW (ref 13.0–17.0)
Immature Granulocytes: 1 %
Lymphocytes Relative: 7 %
Lymphs Abs: 0.5 10*3/uL — ABNORMAL LOW (ref 0.7–4.0)
MCH: 31.1 pg (ref 26.0–34.0)
MCHC: 30.4 g/dL (ref 30.0–36.0)
MCV: 102.4 fL — ABNORMAL HIGH (ref 80.0–100.0)
Monocytes Absolute: 0.5 10*3/uL (ref 0.1–1.0)
Monocytes Relative: 8 %
Neutro Abs: 5.2 10*3/uL (ref 1.7–7.7)
Neutrophils Relative %: 74 %
Platelets: 181 10*3/uL (ref 150–400)
RBC: 3.73 MIL/uL — ABNORMAL LOW (ref 4.22–5.81)
RDW: 15 % (ref 11.5–15.5)
WBC: 7 10*3/uL (ref 4.0–10.5)
nRBC: 0 % (ref 0.0–0.2)

## 2020-12-21 LAB — BASIC METABOLIC PANEL
Anion gap: 14 (ref 5–15)
BUN: 88 mg/dL — ABNORMAL HIGH (ref 8–23)
CO2: 26 mmol/L (ref 22–32)
Calcium: 9.6 mg/dL (ref 8.9–10.3)
Chloride: 98 mmol/L (ref 98–111)
Creatinine, Ser: 6.94 mg/dL — ABNORMAL HIGH (ref 0.61–1.24)
GFR, Estimated: 8 mL/min — ABNORMAL LOW (ref >60)
Glucose, Bld: 103 mg/dL — ABNORMAL HIGH (ref 70–99)
Potassium: 5.5 mmol/L — ABNORMAL HIGH (ref 3.5–5.1)
Sodium: 138 mmol/L (ref 135–145)

## 2020-12-21 LAB — MAGNESIUM: Magnesium: 2.4 mg/dL (ref 1.7–2.4)

## 2020-12-21 MED ORDER — MYCOPHENOLATE SODIUM 180 MG TABLET,DELAYED RELEASE
ORAL_TABLET | Freq: Two times a day (BID) | ORAL | 11 refills | 30.00000 days
Start: 2020-12-21 — End: ?

## 2020-12-21 NOTE — Unmapped (Signed)
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  I am speaking to the patient's family member, sister.    Was a Nurse, learning disability used for this call? No    Verified patient's date of birth / HIPAA.    Specialty medication(s) to be sent: Infectious Disease: Nuzyra and Sivextro      Non-specialty medications/supplies to be sent: azithromycin, torsemide, atorvastatin, mirtazapine      Medications not needed at this time: all other meds on chart         NUZYRA (omadacycline) tablets  Tetracycline antibiotic used for the treatment of bacterial pneumonia.    Medication & Administration     How Supplied:   ??? NUZYRA may come in 6-count, 14-count, or 16-count blister packs of tablets. Each tablet contains 150mg  of omadacycline.    Confirmed the medication and dosage:   ??? Take 2 tablets (300mg ) by mouth daily.    Administration:   ??? Take on empty stomach with water, 2 hours before food or 4 hours after food.   ??? Avoid dairy, antacids, and multivitamins (things with calcium or magnesium) for 4 hours after taking dose.    Missed dose instruction:   ??? If you miss a dose of NUZYRA, then take your dose as soon as you can, then resume your next dose at the usual time.   ??? If it is close to the time for your next dose, skip the dose, and resume at your next scheduled dose. Do not take 2 doses or extra doses.    Storage:   ??? Store at room temperature in a dry place.    Common Side Effects     ??? Nausea/Vomiting  ??? Diarrhea  ??? Tooth discoloration    Warning and Precautions     Please contact your provider if you experience:  ??? Signs and symptoms of an allergic reaction (rash, hives/itching, red, swollen, blistered/peeling skin, wheezing/chest  ??? Signs of liver probles (dark urine, fatigue, stomach pain, yellow skin or eyes)    Interactions:  ??? If you start to take any new medications please check with [place] before starting them.  ??? Interacts with antacids and multivitamins (see administration section)  ??? Absorption may be decreased with high-fat or dairy foods        Sivextro (Tedizolid)    Medication & Administration     Dosage:  Take 1 tablet (200 mg) by mouth daily for 6 days.      Administration:   ??? Administer orally at the same time each day.   ??? May be administered with or without food.  ??? Do not stop taking this medication even if you feel well.  Complete full 6 days.       Adherence/Missed dose instructions: Take a missed dose as soon as you think about.  If it is less than 8 hours until the next dose, skip the missed dose and go back to your normal time. Do not take 2 doses at the same time or extra doses.    Goals of Therapy     ??? To prevent and or treat skin and skin structure infections    Side Effects & Monitoring Parameters     Commonly reported side effects  ??? Diarrhea  ??? Dizziness  ??? Headache  ??? Nausea  ??? Vomiting    The following side effects should be  reported to the provider:  ??? Signs of c-diff associated diarrhea (abdominal pain or cramps, severe diarrhea or watery stools, or bloody stools)  ??? Signs of anaphylaxis (wheezing, chest tightness, swelling of face, lips, tongue or throat)    Monitoring Parameters: Baseline complete blood count (CBC) with differential.  Monitor for improvement in infection, new opportunistic infections, development of severe diarrhea. Monitor baseline CBC. Monitor adherence.     Contraindications, Warnings, & Precautions     ??? Superinfection: Prolonged use may result in fungal or bacterial superinfection, including C. difficile-associated diarrhea and pseudomembranous colitis.    ??? Neutropenia: Not recommended for use in patients with neutrophil counts <1000 cells/mm3. Alternative therapies should be considered when treating patients with neutropenia and acute bacterial skin and skin structure infections.    ??? Reproductive considerations: Adverse events were observed in animal reproduction studies.  ??? Breastfeeding considerations: It is not known if tedizolid is present in breast milk. According to the manufacturer, the decision to breastfeed during therapy should consider the risk of infant exposure, the benefits of breastfeeding to the infant, and benefits of treatment to the mother.    Drug/Food Interactions     ??? Medication list reviewed in Epic. The patient was instructed to inform the care team before taking any new medications or supplements. No drug interactions identified.     Storage, Handling Precautions, & Disposal   ??? Store at room temperature in a dry place.  Do not store in a bathroom.   ??? Keep the lid tightly closed. Keep out of the reach of children and pets.  ??? Do not flush down a toilet or pour down a drain unless instructed to do so.       Current Medications (including OTC/herbals), Comorbidities and Allergies     Current Outpatient Medications   Medication Sig Dispense Refill   ??? aspirin (ECOTRIN) 81 MG tablet Take 1 tablet (81 mg total) by mouth daily. 90 tablet 3   ??? atorvastatin (LIPITOR) 20 MG tablet Take 1 tablet (20 mg total) by mouth daily. 30 tablet 11   ??? azithromycin (ZITHROMAX) 500 MG tablet Take 1 tablet (500 mg total) by mouth daily. 55 tablet 0   ??? bisacodyL (DULCOLAX) 10 mg suppository Insert 10 mg into the rectum daily as needed.     ??? calcium carbonate (OS-CAL) 1,500 mg (600 mg elem calcium) tablet Take 1 tablet (600 mg of elem calcium total) by mouth Two (2) times a day. 60 tablet 11   ??? cholecalciferol, vitamin D3-125 mcg, 5,000 unit,, 125 mcg (5,000 unit) tablet Take 1 tablet (125 mcg total) by mouth daily. 30 tablet 11   ??? diltiazem (CARDIZEM CD) 120 MG 24 hr capsule Take 1 capsule (120 mg total) by mouth daily. 90 capsule 3   ??? magnesium oxide (MAG-OX) 400 mg (241.3 mg elemental magnesium) tablet Take 1 tablet (400 mg total) by mouth daily with lunch. 120 tablet 11   ??? midodrine (PROAMATINE) 5 MG tablet Take 1 tablet (5 mg total) by mouth daily as needed (For use on dialysis days). 30 tablet 2   ??? mirtazapine (REMERON) 45 MG tablet Take 1 tablet (45 mg total) by mouth nightly. 30 tablet 2   ??? mycophenolate (MYFORTIC) 180 MG EC tablet Take 1 tablet (180 mg total) by mouth two (2) times a day. 60 tablet 11   ??? omadacycline 150 mg Tab Take 2 tablets (300 mg total) by mouth daily. 110 tablet 0   ??? pantoprazole (PROTONIX) 40 MG  tablet Take 1 tablet (40 mg total) by mouth Two (2) times a day. 60 tablet 11   ??? polyethylene glycol (MIRALAX) 17 gram packet Take 17 g by mouth daily as needed. 30 packet 2   ??? simethicone (MYLICON) 80 MG chewable tablet Chew 2 tablets (160 mg total) Three (3) times a day as needed for flatulence. 100 tablet 2   ??? tacrolimus (PROGRAF) 1 MG capsule Take 1-5 mg capsule (5 mg) with 1-1 mg capsule (1 mg) BID, total dose 6 mg BID 180 capsule 3   ??? tacrolimus (PROGRAF) 5 MG capsule Take 1-5 mg capsule (5 mg) with 1-1 mg capsule (1 mg) BID, total dose 6 mg BID 180 capsule 3   ??? tedizolid 200 mg Tab Take 1 tablet (200 mg total) by mouth daily. 55 tablet 0   ??? torsemide (DEMADEX) 100 MG tablet Please finish 20mg  tablets as directed by prescriber THEN Take 1 tablet (100 mg) by mouth twice daily on non-HD days (Tuesday, Thursday, Saturday and Sunday). 30 tablet 2   ??? traZODone (DESYREL) 50 MG tablet Take 0.5 tablet (25 mg total) by mouth nightly as needed for sleep. 30 tablet 2     No current facility-administered medications for this visit.       Allergies   Allergen Reactions   ??? Scopolamine Other (See Comments) and Hallucinations     Induces severe delirium    ??? Ativan [Lorazepam] Other (See Comments) and Hallucinations     Severe somnolence/delirium        Patient Active Problem List   Diagnosis   ??? Chronic kidney disease (CKD), stage III (moderate) (CMS-HCC)   ??? AICD (automatic cardioverter/defibrillator) present   ??? Essential hypertension   ??? Heart replaced by transplant (CMS-HCC)   ??? 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 Dialysis patient (CMS-HCC)   ??? ARF (acute renal failure) (CMS-HCC)   ??? Early satiety   ??? ESRD (end stage renal disease) (CMS-HCC)   ??? Respiratory failure (CMS-HCC)   ??? Volume overload   ??? Thrush   ??? Pancytopenia (CMS-HCC)   ??? Abdominal bloating   ??? HLD (hyperlipidemia)   ??? Mood altered   ??? Failure to thrive in adult   ??? MDD (major depressive disorder)   ??? Malnutrition (CMS-HCC)   ??? Mycobacterium infection       Reviewed and up to date in Epic.    Appropriateness of Therapy     Is medication and dose appropriate based on diagnosis? Yes    Prescription has been clinically reviewed: Yes    Baseline Quality of Life Assessment      How many days over the past month did your condition  keep you from your normal activities? For example, brushing your teeth or getting up in the morning. Patient declined to answer    Financial Information     Medication Assistance provided: None Required    Anticipated copay of $50 each reviewed with patient. Verified delivery address.    Delivery Information     Scheduled delivery date: 12/25/20    Expected start date: 12/27/20    Medication will be delivered via UPS to the temporary address in [facility].  This shipment will not require a signature.      Explained the services we provide at [facility] and that each month we would call to set up refills.  Stressed importance of returning phone calls so that we could ensure they receive their medications in time each month.  Informed patient that we should be setting up refills 7-10 days prior to when they will run out of medication.  A pharmacist will reach out to perform a clinical assessment periodically.  Informed patient that a welcome packet, containing information about our pharmacy and other support services, a Notice of Privacy Practices, and a drug information handout will be sent.      Patient verbalized understanding of the above information as well as how to contact the pharmacy at [phone] option 4 with any questions/concerns.  The pharmacy is open Monday through Friday 8:30am-4:30pm.  A pharmacist is available 24/7 via pager to answer any clinical questions they may have.    Patient Specific Needs     - Does the patient have any physical, cognitive, or cultural barriers? No    - Patient prefers to have medications discussed with  Family Member     - Is the patient or caregiver able to read and understand education materials at a high school level or above? Yes    - Patient's primary language is  English     - Is the patient high risk? No    - Does the patient require a Care Management Plan? No     - Does the patient require physician intervention or other additional services (i.e. nutrition, smoking cessation, social work)? No      Arnold Long  [facility] Specialty Pharmacist

## 2020-12-21 NOTE — Unmapped (Addendum)
CSW received secure chat referral from transplant nurse coordinator. Patient has not had PT visit from [facility] and RN had difficulty taking labs on Friday.     CSW left message for [facility] to inquire about delay in start of care (now almost two week from scheduled start).     CSW made telephone contact with [facility] hospital liaison/Victor Dyer to get assistance in exploring this delayed start of care. Victor Dyer is able to see some notes from [facility] providers to see that several visits were rescheduled and patient's PCP was notified.  CSW double checked referral and order sets which both identify Victor Pugh MD via NP/Victor Dyer and transplant nurse coordinator/Victor Dyer is clearly stated on both.     CSW speaks with patient's sister, who shares that she had left two messages at [facility], one on 2/24 and one on 2/25 without a return call. Victor Dyer shares that they have asked for care on non-dialysis days (Tuesday/Thursday). She stated that aid has been helping 1-2 x week to assist with bath, which has been very helpful. Victor Dyer stated that RN who visited on Friday was not confident in her skills and was not able to draw blood from Victor Dyer. Victor Dyer and Victor Dyer would like to have patient get labs drawn at local hospital instead. Victor Dyer voiced disappointment that their experience with [facility] has been so dramatically different than their first experience in 2021.     CSW shares hospital liaison's contact information with sister, at liaison's request. CSW will follow-up with liaison and with patient or his sister with updates.       ADDENDUM  December 24, 2020 8:50 AM  CSW receives return phone call from [facility] liaison/Victor Dyer who spoke with agency staff, including PT to better understand delay of care. Victor Dyer shares that PT misunderstood patient's request to delay OT services until the beginning of March due to frequency of appointments and dialysis. PT has since started seeing patient in the home.  Victor Dyer inquires about how else [facility] can assist patient.  CSW shares positive feedback received from sister, that aid is very helpful. Victor Dyer offers information about home care division for additional aid care which is often not covered by health insurance.  CSW encourages Victor Dyer to follow up with sister and inquire re: interest.

## 2020-12-23 LAB — TACROLIMUS LEVEL: Tacrolimus (FK506) - LabCorp: 7.5 ng/mL (ref 2.0–20.0)

## 2020-12-24 DIAGNOSIS — Z941 Heart transplant status: Principal | ICD-10-CM

## 2020-12-24 DIAGNOSIS — T86298 Other complications of heart transplant: Principal | ICD-10-CM

## 2020-12-24 LAB — CBC W/ DIFFERENTIAL
BASOPHILS ABSOLUTE COUNT: 0 10*3/uL (ref 0.0–0.1)
BASOPHILS RELATIVE PERCENT: 0 %
EOSINOPHILS ABSOLUTE COUNT: 0.7 10*3/uL — ABNORMAL HIGH (ref 0.0–0.5)
EOSINOPHILS RELATIVE PERCENT: 10 %
HEMATOCRIT: 38.2 % — ABNORMAL LOW (ref 39.0–52.0)
HEMOGLOBIN: 11.6 g/dL — ABNORMAL LOW (ref 13.0–17.0)
LYMPHOCYTES ABSOLUTE COUNT: 0.5 10*3/uL — ABNORMAL LOW (ref 0.7–4.0)
LYMPHOCYTES RELATIVE PERCENT: 7 %
MEAN CORPUSCULAR HEMOGLOBIN CONC: 30.4 g/dL (ref 30.0–36.0)
MEAN CORPUSCULAR HEMOGLOBIN: 31.1 pg (ref 26.0–34.0)
MEAN CORPUSCULAR VOLUME: 102.4 fL — ABNORMAL HIGH (ref 80.0–100.0)
MONOCYTES RELATIVE PERCENT: 8 %
NEUTROPHILS ABSOLUTE COUNT: 5.2 10*3/uL (ref 1.7–7.7)
NEUTROPHILS RELATIVE PERCENT: 74 %
NUCLEATED RED BLOOD CELLS: 0 % (ref 0.0–0.2)
PLATELET COUNT: 181 10*3/uL (ref 150–400)
RED BLOOD CELL COUNT: 3.73 MIL/uL — ABNORMAL LOW (ref 4.22–5.81)
RED CELL DISTRIBUTION WIDTH: 15 % (ref 11.5–15.5)
WHITE BLOOD CELL COUNT: 7 10*3/uL (ref 4.0–10.5)

## 2020-12-24 LAB — RENAL FUNCTION PANEL
ANION GAP: 14 (ref 5–15)
BLOOD UREA NITROGEN: 88 mg/dL — ABNORMAL HIGH (ref 8–23)
CALCIUM: 9.6 mg/dL (ref 8.9–10.3)
CHLORIDE: 98 mmol/L (ref 98–111)
CO2: 26 mmol/L (ref 22–32)
CREATININE: 6.94 mg/dL — ABNORMAL HIGH (ref 0.61–1.24)
EGFR MDRD NON AF AMER: 8 mL/min — ABNORMAL LOW
GLUCOSE RANDOM: 103 mg/dL — ABNORMAL HIGH (ref 70–99)
POTASSIUM: 5.5 mmol/L — ABNORMAL HIGH (ref 3.5–5.1)
SODIUM: 138 mmol/L (ref 135–145)

## 2020-12-24 LAB — TACROLIMUS LEVEL: TACROLIMUS BLOOD: 7.5 ng/mL (ref 2.0–20.0)

## 2020-12-24 LAB — MAGNESIUM: MAGNESIUM: 2.4 mg/dL (ref 1.7–2.4)

## 2020-12-24 LAB — CMV DNA BY PCR, QUALITATIVE: CMV DNA, Qual PCR: NEGATIVE

## 2020-12-24 MED FILL — TORSEMIDE 100 MG TABLET: ORAL | 26 days supply | Qty: 30 | Fill #0

## 2020-12-24 MED FILL — ATORVASTATIN 20 MG TABLET: ORAL | 30 days supply | Qty: 30 | Fill #1

## 2020-12-24 MED FILL — SIVEXTRO 200 MG TABLET: ORAL | 25 days supply | Qty: 25 | Fill #0

## 2020-12-24 MED FILL — AZITHROMYCIN 500 MG TABLET: ORAL | 25 days supply | Qty: 25 | Fill #0

## 2020-12-24 MED FILL — MIRTAZAPINE 45 MG TABLET: ORAL | 30 days supply | Qty: 30 | Fill #0

## 2020-12-24 MED FILL — NUZYRA 150 MG TABLET: ORAL | 24 days supply | Qty: 48 | Fill #0

## 2020-12-24 NOTE — Unmapped (Signed)
Heart Transplant Clinic Follow Up Note    Referring Provider: Samuella Cota, MD   Primary Provider: Gelene Mink, MD   Transplant Cardiology Provider:  Carin Hock, MD    Reason for Visit:  Victor Dyer is a 65 y.o. male being seen for continuity of care.        -- Heart transplant/Immunosuppression. On dual immunosuppression therapy with tacrolimus (goal 6-10) and Myfortic 180 mg BID. His levels are pending today. Previously discussed option of mTOR therapy for him vs. Stopping myfortic and continuing tacrolimus monotherapy.   He attempted to have PFTs today but was unable to perform them; will arrange w/ his next appointment.    -- Infection. 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 Seen 12/08/20 by [facility] with plan to continue his abx through 01/20/21 as previously discussed. He'll return in 3 weeks to see [facility] ID again as he's coming off abx therapy. No changes at this time and continue weekly lab monitoring w/ fibrinogen.     -- Hypertension. On Midodrine for BP mgt on HD days. BPs are variable, but overall controlled. He's having some mid-HD hypotensive episodes when not taking his midodrine; asked to take the midodrine before every HD session and reassured that his BP at 140/90 when due for diltiazem is acceptable since he's well controlled otherwise. Continue to monitor.     -- Respiratory. DC home without trach but w/ O2 to use at night and PRN through the day.  As above, unable to complete PFTs today. Last Chest CT 09/30/20 showed slightly smaller moderate right pleural effusion.      -- Appetite/weight management/GI symptoms. On Mirtazapine 45 mg daily with improving appetite. Using tube feeds at night and is supplementing with PO intake as tolerated. Remains on Pantoprazole 40 mg BID for esophagitis; will decrease to daily today since his magnesium is low and denies GERD sx.     -- ESRD on dialysis. Creatinine today is pending and he remains on HD Mon/Wed/Fri.  Minimal urine output at this time.      -- Hyperlipidemia. Pre-txp on atorvastatin 80 mg daily. On atorvastatin 20 mg daily; will titrate upward as tolerated. LDL 09/01/20 was 72.    -- Mood. Improved today on Mirtazapine 45 mg daily along w/ increasing his activity as tolerated. Continue to monitor.     -- Health Maintenance/Misc:   ENT: discussed that his hoarse voice may be related to vocal cord irritation. May refer to ENT PRN.  Dental: may have anytime, no pre-procedure abx required  Eye: due within a year  Dermatology: due within a year  CXR: 10/07/20, stable bilateral pleural effusion  Bone density: 05/18/18 w/ normal lumbar spine, decreased femur density  PSA: 12/29/20 was 0.58  Colonoscopy: 05/11/18 with 5 polyps 3-6 mm in sigmoid colon. Recommendation was for repeat in 2021 (due to R polyps over 2 successive colonoscopies and suboptimal prep on each colonoscopy)  ID: Flu 07/23/20, Pneumovax 05/20/20, Tetanus 10/03/18  COVID: 01/09/20, 01/30/20, 07/28/20, 12/29/20    Return to clinic: 3 weeks w/ [facility]      I personally spent 40 minutes face-to-face and non-face-to-face in the care of this patient, which includes all pre, intra, and post visit time on the date of service.      History of Present Illness:  Victor Dyer is a 65 y.o. male with underwent a heart transplantation for ischemic cardiomyopathy on 02/27/20.     Please see below for  his prolonged post-txp course.     Most recently he was seen 12/08/20 with hospital follow up along w/ [facility] ID. No changes were made.     Here for continuity of care. Still having diarrhea, attributes to his multiple abx and tube feeds. Having at least two bowel movements/day but denies evidence of infection w/ this. Despite loose bowels his overall weight is trending up, which he attributes to improved appetite (eating 3 meals/day)   Drinking 2-3 glasses water/day; flushing w/ 160-200 mL right now over the course of the day.   About a week ago felt a little warm, maybe a degree higher on thermometer w/ home health but 'nothing came of it'.  Breathing is 'ok'; couldn't do the PFTs today. Not wearing his O2 today. He's finally able to do PT at home, slowly trying to build his tolerance.   He has an appointment next week w/ the HD providers to discuss his regimen; concerned that they're taking too much volume leading to hypotension w/ sessions. He doesn't always use the midodrine prior to his HD session; finding his BPs are dropping mid-session. BPs at home 110-140/90s (higher in the PM when he's due to take his next dose of diltiazem.   Denies any chest pain, palpitations, orthopnea, PND. No fever, chills, sweats, nausea, vomiting, diarrhea. No dark/tarry stools, BRBPR, epistaxis.       Cardiac Transplant History and Surveillance Testing: CMV D+/R-, EBV D+/R-, Toxo D-/R-, Hep C Ab -   As previously reviewed: pre-transplant, he had history of ischemic cardiomyopathy (presenting as anterior STEMI in 01/2017 with severe 3v CAD and severe LV dysfunction), s/p PCI (2018), s/p HeartMate 3 placement 06/04/2018. ??His post-transplant course has been complicated by coagulopathy s/p washout and delayed chest closure on 5/8, followed by post-extubation respiratory failure in the setting of copious bloody secretions and mucus, with large R sided pleural effusion, resulting in re-intubation on 5/14. ??IP was consulted to insert R chest tube with >700 cc fluid out, and bronch confirmed GNR pneumonia. ??5/14 BAL grew >1,000,000 CFU/mL Serratia marcescens, and pleural fluid grew Candida parapsilosis, for which he is currently on cefepime, vancomycin, ampho B, micafungin. ??His 1st echo on 5/13 showed normal biventricular function, and he easily weaned off inotropic support. ??His 1st heart biopsy was delayed from 5/14 to 5/17 because of his respiratory status. ??He has also had impressively elevated BPs which had been treated with clevidipine and diltiazem pre-re-intubation. ??Had difficulty extubating due to significant secretions in setting of PNA and R hemidiaphragm paralysis. ??5/17 1st EMBx was favorable (ISHLT 0R), and confirmed R hemidiaphragm paralysis (5/16 NIF was -10), and RIJ MAC was removed, and LIJ 3L CL was placed for venous access. ??Repeat 5/18 Bronch/BAL (to reassess extubation ability) still showed thick secretions, + Serratia (though less: 300k CFU/ml from >1.67M) but also + Burkholderia. Eventually got R sided chest tube with fluid growing Candida. Completed course of cefepime/ceftaz and mica/fluconazole. S/p trach/PEG on 5/19, with trach collar trials going well overall and last week he was tolerating up 12 hrs (interrupted some by GI surgeries). Last BAL on 6/5 grew Burkholderia, therefore restarted on ABX per ID - minocycline??and meropenem. ????With therapeutic Tac levels, he had progressive ARF with Cr >2 starting 5/22, peak Cr 5.99 on 5/26. ??He was started on HD/CRRT 5/26 with 3L UF by 5/27 with decreasing Cr (3.16 at 2nd EMBx 5/27, when Tac level 8.0 [range 8-14 x 1 week]), and able to lay flat. EMBx #3, 6/3 - 0R; RA mean 13,  wedge 18, cardiac index 3.2, PA mean 30. ??PA sat 55%. ??He unfortunately had peritonitis and a perforated G tube requiring surgical removal of old G tube with patching, GJ placement 5/31 and redo surgery for debridement of midline incision facial edges 6/4 in setting of wound dehiscence. ??On 6/12 there was concern for worsening infection due to rise in WBC and soft Bps. Abx were broadened for peritonitis coverage. He was volume up and has intemittently needed agressive fluid removal with CRRT. Has now been transitioned to [facility] and tolerating this well. He became septic again  with blood cultures that were positive for Burkholderia again (04/06/20) , raising concern about poor absorption of abx. He was switched to IV minocycline and IV bactrim was added with improvement. Tolerated aggressive fluid removal after that with improved blood pressures and he was transitioned to iHD, passed swallow eval and was in the step down for a period of time doing well. Had a setback requiring return to CICU for CRRT due to hypoxia. Since than he has unfortunately continued to require nocturnal ventilator support and is at this point able to tolerate trach collar for about 13 hours. 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 This has now healed well and he is tolerating PO intake. He is decannulated from trach 07/27/20. He was discharged to [facility] 08/04/20 and discharged home 08/20/20.    Left Heart Cath / Stress Tests:  ?? n/a    Echo:  ?? 05/15/20: LVEF >55%  ?? 08/27/20: LVEF 55-60%  ?? 09/21/20: LVEF 50-55%    Rejection History:   ?? n/a             DSA:   ?? 03/30/20: No DSA  ?? 06/27/20: No DSA  ?? 10/08/20: No DSA  ?? 12/08/20: No DSA    Past Medical History:  Past Medical History:   Diagnosis Date   ??? Anemia    ??? Arthritis    ??? Chronic systolic heart failure (CMS-HCC)     05/2018: LVAD- [place] (Bridge to Transplant)   ??? Coronary artery disease     01/2017: DES to RCA x2 / 02/2017: DES to LCx x3   ??? Gout    ??? Hiatal hernia    ??? Inguinal hernia     Right   ??? Paroxysmal atrial fibrillation (CMS-HCC) 03/26/2018    12/13/2019: No A-Fib on Device Interrogation   ??? Scoliosis     Neck   ??? Shingles    ??? Skin cancer, basal cell    ??? STEMI (ST elevation myocardial infarction) (CMS-HCC) 01/2017     Hospitalization History:   09/19/20-12/03/20:  Presented to his local ED with fluid overload, chest congestion. Orders placed to transfer him to [facility]. 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 [facility] ID follow him through the hospitalization; his HD line was replaced. Bronch grew pseudomonas aerginosas (likely colonization). His abx were adjusted per [facility] ID team, discharged home w/ Tedizolid 200 mg daily, omadacycline 300 mg daily and azithromycin 500 mg daily x 3 months (through 01/20/21). E was switched from Mycophenolate to myfortic d/t GI symptoms. A gastric emptying study 12/7 showed no evidence of delayed emptying. He continued to supplement his po intake w/ tube feeds. From a volume perspective, he was managed w/ dialysis and torsemide 100 mg BID on non-HD days.   Psychiatry saw him; escitalopram was stopped d/t prolonged QTC prior to DC; continued on  mirtazapine 45 mg daily.  In 09/2020 he developed some thrombocytopenia (thought to be related to abx esp linezolid, which was stopped 10/09/20). From a respiratory perspective, pulmonary bronched him 12/6  And chest CT 12/8 showed decreased pleural effusions. He was treated w/ Epo for his anemia; received unit pRBC 12/19. Despite severe deconditioning, he did not qualify for insurance coverage of AIR. He was discharged home 12/03/20 with home PT/OT support.      Past Surgical History:   Past Surgical History:   Procedure Laterality Date   ??? LEFT VENTRICULAR ASSIST DEVICE  06/04/2018   ??? MOHS SURGERY     ??? PR BRONCHOSCOPY,DIAGNOSTIC W LAVAGE Bilateral 02/27/2020    Procedure: Bronchoscopy, Rigid Or Flexible, Include Fluoroscopic Guidance When Performed; W/Bronchial Alveolar Lavage;  Surgeon: Loletta Specter, MD;  Location: MAIN OR [facility];  Service: Cardiac Surgery   ??? PR BRONCHOSCOPY,DIAGNOSTIC W LAVAGE Bilateral 09/28/2020    Procedure: BRONCHOSCOPY, RIGID OR FLEXIBLE, INCLUDE FLUOROSCOPIC GUIDANCE WHEN PERFORMED; W/BRONCHIAL ALVEOLAR LAVAGE WITH MODERATE SEDATION;  Surgeon: Mercy Moore, MD;  Location: BRONCH PROCEDURE LAB [facility];  Service: Pulmonary   ??? PR CATH PLACE/CORON ANGIO, IMG SUPER/INTERP,W LEFT HEART VENTRICULOGRAPHY N/A 01/30/2017    Acute anterior MI. Severe 3-V CAD. Severe LV dysfunction. Bilateral inguinal hernias.   ??? PR CATH PLACE/CORON ANGIO, IMG SUPER/INTERP,W LEFT HEART VENTRICULOGRAPHY N/A 02/07/2017    Significant 3-vessel coronary artery disease. There is complete occlusion of the ostial LAD. The LCx has severe disease of both OM1 and OM2. Decreased LV function by previous MRI. EF estimated 31%. LVEDP 30 mmHg (this was checked due to to CHF symptoms). PCI was performed of the proximal 90 RCA lesion using Synergy (drug eluting stent) x2. Excellent angiographic result with TIMI 3 flow    ??? PR CATH PLACE/CORON ANGIO, IMG SUPER/INTERP,W LEFT HEART VENTRICULOGRAPHY N/A 03/13/2017    Procedure: Left Heart Catheteriation with possible revascularization;  Surgeon: Lisabeth Devoid, MD;  Location: [place] CATH;  Service: Cardiology   ??? PR COLONOSCOPY FLX DX W/COLLJ SPEC WHEN PFRMD N/A 05/11/2018    Procedure: COLONOSCOPY, FLEXIBLE, PROXIMAL TO SPLENIC FLEXURE; DIAGNOSTIC, W/WO COLLECTION SPECIMEN BY BRUSH OR WASH;  Surgeon: Zetta Bills, MD;  Location: MAIN OR [facility];  Service: Gastroenterology   ??? PR COLSC FLEXIBLE W/CONTROL BLEEDING ANY METHOD N/A 12/21/2017    Procedure: COLONOSCOPY with polypectomy , resolution clip;  Surgeon: Olevia Bowens, MD;  Location: ENDO PROCEDURES ;  Service: Gastroenterology   ??? PR COLSC FLX W/RMVL OF TUMOR POLYP LESION SNARE TQ N/A 05/11/2018    Procedure: COLONOSCOPY FLEX; W/REMOV TUMOR/LES BY SNARE;  Surgeon: Zetta Bills, MD;  Location: GI PROCEDURES [facility];  Service: Gastroenterology   ??? PR ENDOSCOPY UPPER SMALL INTESTINE N/A 05/11/2020    Procedure: SMALL INTESTINAL ENDOSCOPY, ENTEROSCOPY BEYOND SECOND PORTION OF DUODENUM, NOT INCL ILEUM; DX, INCL COLLECTION OF SPECIMEN(S) BY BRUSHING OR WASHING, WHEN PERFORMED;  Surgeon: Vonda Antigua, MD;  Location: GI PROCEDURES [facility]  Service: Gastroenterology   ??? PR EXPLOR LIVER WOUND,EXTENSIV DEBRIDE N/A 03/22/2020    Procedure: Mgmt Liver Hemorr; Explor/Exten Debrid/Coag/Sut;  Surgeon: Merri Ray, MD;  Location: MAIN OR [facility];  Service: Trauma   ??? PR EXPLOR POSTOP BLEED,INFEC,CLOT-CHST N/A 02/29/2020    Procedure: EXPLOR POSTOP HEMORR THROMBOSIS/INFEC; CHEST;  Surgeon: Arlester Marker, MD;  Location: MAIN OR [facility];  Service: Cardiac Surgery   ??? PR EXPLORATORY OF ABDOMEN N/A 03/22/2020    Procedure: Exploratory laparotomy, repair of G-tube site, washout, placement of new G-J  tube;  Surgeon: Merri Ray, MD;  Location: MAIN OR [facility];  Service: Trauma   ??? PR EXPLORATORY OF ABDOMEN N/A 03/27/2020    Procedure: EXPLORATORY LAPAROTOMY, EXPLORATORY CELIOTOMY WITH OR WITHOUT BIOPSY(S);  Surgeon: Katherina Mires, MD;  Location: MAIN OR [facility];  Service: Trauma   ??? PR INSERT VENT ASST DEV,IMPLANT,SINGLE VENT Midline 06/04/2018    Procedure: INSERTION OF VENTRICULAR ASSIST DEVICE, IMPLANTABLE INTRACORPOREAL, SINGLE VENTRICLE;  Surgeon: Lennie Odor, MD;  Location: MAIN OR [facility];  Service: Cardiac Surgery   ??? PR INSJ/RPLCMT PERM DFB W/TRNSVNS LDS 1/DUAL CHMBR N/A 08/23/2017    Procedure: IMPLANTATION OF INTERNAL CARDIAC DEFIBRILLATOR;  Surgeon: Paulla Dolly, MD;  Location: [place] [facility];  Service: Cardiology   ??? PR PERQ DRAINAGE PLEURA INSERT CATH W/IMAGING Right 03/06/2020    Procedure: PLEURAL DRAINAGE, PERC, W INSERTION OF INDWELLING CATHETER; W IMAGING GUIDANCE;  Surgeon: Wilfrid Lund, DO;  Location: BRONCH PROCEDURE LAB [facility];  Service: Pulmonary   ??? PR PLACE PERCUT GASTROSTOMY TUBE Left 03/11/2020    Procedure: Kayleen Memos Endo; W/Directed Plcmt Perq Gastrostomy Tube;  Surgeon: Evert Kohl, MD;  Location: MAIN OR [facility];  Service: Thoracic   ??? PR RIGHT HEART CATH O2 SATURATION & CARDIAC OUTPUT N/A 06/22/2017    Procedure: Right Heart Catheterization;  Surgeon: Vernetta Honey, MD;  Location: [place] CATH;  Service: Cardiology   ??? PR RIGHT HEART CATH O2 SATURATION & CARDIAC OUTPUT N/A 05/08/2018    Procedure: Right Heart Catheterization;  Surgeon: Carin Hock, MD;  Location: [facility] CATH;  Service: Cardiology   ??? PR RIGHT HEART CATH O2 SATURATION & CARDIAC OUTPUT N/A 09/04/2018    Procedure: Right Heart Catheterization;  Surgeon: Carin Hock, MD;  Location: [facility] CATH;  Service: Cardiology   ??? PR RIGHT HEART CATH O2 SATURATION & CARDIAC OUTPUT N/A 11/12/2019    Procedure: Right Heart Catheterization;  Surgeon: Carin Hock, MD;  Location: [facility] CATH;  Service: Cardiology   ??? PR RIGHT HEART CATH O2 SATURATION & CARDIAC OUTPUT N/A 03/09/2020    Procedure: Right Heart Catheterization W Biopsy;  Surgeon: Liliane Shi, MD;  Location: [facility] CATH;  Service: Cardiology   ??? PR RIGHT HEART CATH O2 SATURATION & CARDIAC OUTPUT N/A 03/19/2020    Procedure: Right Heart Catheterization W Biopsy;  Surgeon: Liliane Shi, MD;  Location: [facility] CATH;  Service: Cardiology   ??? PR RIGHT HEART CATH O2 SATURATION & CARDIAC OUTPUT N/A 03/26/2020    Procedure: Right Heart Catheterization W Biopsy;  Surgeon: Tiney Rouge, MD;  Location: [facility] CATH;  Service: Cardiology   ??? PR RIGHT HEART CATH O2 SATURATION & CARDIAC OUTPUT N/A 04/07/2020    Procedure: Right Heart Catheterization W Biopsy;  Surgeon: Carin Hock, MD;  Location: [facility] CATH;  Service: Cardiology   ??? PR RIGHT HEART CATH O2 SATURATION & CARDIAC OUTPUT N/A 04/21/2020    Procedure: Right Heart Catheterization W Biopsy;  Surgeon: Carin Hock, MD;  Location: [facility] CATH;  Service: Cardiology   ??? PR RIGHT HEART CATH O2 SATURATION & CARDIAC OUTPUT N/A 05/13/2020    Procedure: Right Heart Catheterization W Biopsy;  Surgeon: Carin Hock, MD;  Location: [facility] CATH;  Service: Cardiology   ??? PR RIGHT HEART CATH O2 SATURATION & CARDIAC OUTPUT N/A 06/09/2020    Procedure: Right Heart Catheterization W Biopsy;  Surgeon: Carin Hock, MD;  Location: [facility] CATH;  Service: Cardiology   ??? PR RIGHT HEART CATH O2 SATURATION & CARDIAC OUTPUT N/A 07/07/2020    Procedure:  Right Heart Catheterization W Biopsy;  Surgeon: Carin Hock, MD;  Location: [facility] CATH;  Service: Cardiology   ??? PR RIGHT HEART CATH O2 SATURATION & CARDIAC OUTPUT N/A 08/04/2020    Procedure: Right Heart Catheterization W Biopsy;  Surgeon: Carin Hock, MD;  Location: [facility] CATH;  Service: Cardiology   ??? PR RIGHT HEART CATH O2 SATURATION & CARDIAC OUTPUT N/A 09/01/2020    Procedure: Right Heart Catheterization W Biopsy;  Surgeon: Carin Hock, MD;  Location: [facility] CATH;  Service: Cardiology   ??? PR RIGHT HEART CATH O2 SATURATION & CARDIAC OUTPUT N/A 10/27/2020    Procedure: Right Heart Catheterization W Biopsy;  Surgeon: Carin Hock, MD;  Location: [facility] CATH;  Service: Cardiology   ??? PR SB SCOPE,PLACE PERCUT JEJUN TUBE N/A 05/11/2020    Procedure: SM INTEST ENDO NOT ILEUM; W/PLCMT JEJUNOST TUBE;  Surgeon: Vonda Antigua, MD;  Location: GI PROCEDURES [facility];  Service: Gastroenterology   ??? PR SB SCOPE,PLACE TRANSENDOSCOPIC STENT N/A 05/11/2020    Procedure: SMALL INTEST ENDOSCOPY, NOT INCLUDING ILEUM, W/TRANSENDOSCOPIC STENT PLACEMENT;  Surgeon: Vonda Antigua, MD;  Location: GI PROCEDURES [facility];  Service: Gastroenterology   ??? PR TRACHEOSTOMY, PLANNED Midline 03/11/2020    Procedure: TRACHEOSTOMY PLANNED (SEPART PROC);  Surgeon: Evert Kohl, MD;  Location: MAIN OR [facility];  Service: Thoracic   ??? PR TRANSPLANTATION OF HEART Midline 02/27/2020    Procedure: HEART TRANSPL W/WO RECIPIENT CARDIECTOMY;  Surgeon: Loletta Specter, MD;  Location: MAIN OR [facility];  Service: Cardiac Surgery   ??? PR UPPER GI ENDOSCOPY,BIOPSY N/A 12/21/2017    Procedure: ESOPHAGOGASTRODUODENOSCOPY with biopsy;  Surgeon: Olevia Bowens, MD;  Location: ENDO PROCEDURES [place];  Service: Gastroenterology   ??? PR UPPER GI ENDOSCOPY,BIOPSY N/A 05/07/2020    Procedure: UGI ENDOSCOPY; WITH BIOPSY, SINGLE OR MULTIPLE;  Surgeon: Vonda Antigua, MD;  Location: GI PROCEDURES [facility];  Service: Gastroenterology   ??? PR UPPER GI ENDOSCOPY,BIOPSY N/A 08/19/2020    Procedure: UGI ENDOSCOPY; WITH BIOPSY, SINGLE OR MULTIPLE;  Surgeon: Maris Berger, MD;  Location: GI PROCEDURES [facility];  Service: Gastroenterology   ??? PR UPPER GI ENDOSCOPY,DIAGNOSIS N/A 05/20/2020    Procedure: UGI ENDO, INCLUDE ESOPHAGUS, STOMACH, & DUODENUM &/OR JEJUNUM; DX W/WO COLLECTION SPECIMN, BY BRUSH OR WASH;  Surgeon: Bronson Curb, MD;  Location: GI PROCEDURES [facility];  Service: Gastroenterology   ??? PR VALVULOPLAS TRICUS W RING INSERT Midline 06/04/2018    Procedure: Valvuloplasty Tricuspid Valv; W/Ring Insrt;  Surgeon: Lennie Odor, MD;  Location: MAIN OR [facility];  Service: Cardiac Surgery       Allergies:   Scopolamine and Ativan [lorazepam]    Medications:  Current Outpatient Medications on File Prior to Visit   Medication Sig   ??? aspirin (ECOTRIN) 81 MG tablet Take 1 tablet (81 mg total) by mouth daily.   ??? atorvastatin (LIPITOR) 20 MG tablet Take 1 tablet (20 mg total) by mouth daily.   ??? azithromycin (ZITHROMAX) 500 MG tablet Take 1 tablet (500 mg total) by mouth daily.   ??? calcium carbonate (OS-CAL) 1,500 mg (600 mg elem calcium) tablet Take 1 tablet (600 mg of elem calcium total) by mouth Two (2) times a day.   ??? cholecalciferol, vitamin D3-125 mcg, 5,000 unit,, 125 mcg (5,000 unit) tablet Take 1 tablet (125 mcg total) by mouth daily.   ??? diltiazem (CARDIZEM CD) 120 MG 24 hr capsule Take 1 capsule (120 mg total) by mouth daily.   ??? midodrine (PROAMATINE) 5  MG tablet Take 1 tablet (5 mg total) by mouth daily as needed (For use on dialysis days).   ??? mirtazapine (REMERON) 45 MG tablet Take 1 tablet (45 mg total) by mouth nightly.   ??? mycophenolate (MYFORTIC) 180 MG EC tablet Take 1 tablet (180 mg total) by mouth two (2) times a day.   ??? omadacycline 150 mg Tab Take 2 tablets (300 mg total) by mouth daily.   ??? tacrolimus (PROGRAF) 1 MG capsule Take 1-5 mg capsule (5 mg) with 1-1 mg capsule (1 mg) BID, total dose 6 mg BID   ??? tacrolimus (PROGRAF) 5 MG capsule Take 1-5 mg capsule (5 mg) with 1-1 mg capsule (1 mg) BID, total dose 6 mg BID   ??? tedizolid 200 mg Tab Take 1 tablet (200 mg total) by mouth daily.   ??? torsemide (DEMADEX) 100 MG tablet Please finish 20mg  tablets as directed by prescriber THEN Take 1 tablet (100 mg) by mouth twice daily on non-HD days (Tuesday, Thursday, Saturday and Sunday).     No current facility-administered medications on file prior to visit.       Social History:   Currently living w/ his sister.     Family History:   Family History   Problem Relation Age of Onset   ??? Heart disease Father         CABG   ??? Heart disease Paternal Grandfather         MI age 70, COD   ??? Early death Paternal Grandfather    ??? Heart disease Cousin         Paternal side: CABG        Review of Systems:  The balance of 10/12 systems is negative with the exception of HPI.    Physical Exam:  VITAL SIGNS:   Vitals:    12/29/20 1041   BP: 133/81   Pulse: 105   Temp: 36.4 ??C (97.5 ??F)   SpO2: 98%      Wt Readings from Last 12 Encounters:   12/29/20 65 kg (143 lb 6.4 oz)   12/29/20 65 kg (143 lb 6.4 oz)   12/08/20 63 kg (139 lb)   12/08/20 63 kg (139 lb)   12/08/20 63 kg (139 lb)   12/02/20 61.9 kg (136 lb 7.4 oz)   09/09/20 62.9 kg (138 lb 9.6 oz)   09/01/20 63 kg (138 lb 12.8 oz)   09/01/20 63 kg (138 lb 12.8 oz)   08/27/20 63.5 kg (140 lb)   08/27/20 63.5 kg (140 lb)   08/19/20 64.1 kg (141 lb 5 oz)     Constitutional: NAD, chronically ill but pleasant  ENT: [facility]. Wearing mask  Neck: Supple without enlargements, no thyromegaly, bruit, no JVD/JVP upright. No cervical or supraclavicular lymphadenopathy. Trach site healed  Cardiovascular: Nondisplaced PMI, normal S1, S2, no MGR. Normal carotid pulses without bruits. Normal peripheral pulses.   Lungs: Clear to auscultation bilaterally, without wheezes/crackles/rhonchi. Lungs w/ dim bases bilaterally R>L.    Skin:  midsternal incision w/ inferior portion of scaly, dry skin unchanged.  Minimal irritation at his G-tube site.   Abdomen: hypoactive bowel sounds, abdomen soft, non-tender and not distended. J-tube w/ dressing intact  Extremities: No bilateral cyanosis, clubbing. Trace ankle edema.  No rash, lesions or petiche.  Musculo Skeletal: No joint tenderness, deformity, effusions. Full range of motion in shoulder, elbow, hip knee, ankle, hands and feet.  Psychiatry: Pleasant, making jokes  Neurological: Alert and oriented to person, place, and  time. Grossly intact.   Pertinent Laboratory Studies:     Appointment on 12/29/2020   Component Date Value Ref Range Status   ??? PSA 12/29/2020 0.58  0.00 - 4.00 ng/mL Final   ??? Fibrinogen 12/29/2020 448 (A) 177 - 386 mg/dL Final   ??? Phosphorus 12/29/2020 3.2  2.4 - 5.1 mg/dL Final   ??? Magnesium 16/07/9603 1.7  1.6 - 2.6 mg/dL Final   ??? Sodium 54/06/8118 137  135 - 145 mmol/L Final   ??? Potassium 12/29/2020 4.0  3.5 - 5.1 mmol/L Final   ??? Chloride 12/29/2020 96 (A) 98 - 107 mmol/L Final   ??? Anion Gap 12/29/2020 7  5 - 14 mmol/L Final   ??? CO2 12/29/2020 34.0 (A) 20.0 - 31.0 mmol/L Final   ??? BUN 12/29/2020 38 (A) 9 - 23 mg/dL Final   ??? Creatinine 12/29/2020 3.56 (A) 0.60 - 1.10 mg/dL Final   ??? BUN/Creatinine Ratio 12/29/2020 11   Final   ??? EGFR CKD-EPI Non-African American,* 12/29/2020 17 (A) >=60 mL/min/1.62m2 Final   ??? EGFR CKD-EPI African American, Male 12/29/2020 20 (A) >=60 mL/min/1.48m2 Final   ??? Glucose 12/29/2020 122  70 - 179 mg/dL Final   ??? Calcium 14/78/2956 9.5  8.7 - 10.4 mg/dL Final   ??? Albumin 21/30/8657 3.2 (A) 3.4 - 5.0 g/dL Final   ??? Total Protein 12/29/2020 6.2  5.7 - 8.2 g/dL Final   ??? Total Bilirubin 12/29/2020 0.2 (A) 0.3 - 1.2 mg/dL Final   ??? AST 84/69/6295 33  <=34 U/L Final   ??? ALT 12/29/2020 33  10 - 49 U/L Final   ??? Alkaline Phosphatase 12/29/2020 178 (A) 46 - 116 U/L Final   ??? WBC 12/29/2020 5.8  3.6 - 11.2 10*9/L Final   ??? RBC 12/29/2020 3.26 (A) 4.26 - 5.60 10*12/L Final   ??? HGB 12/29/2020 10.4 (A) 12.9 - 16.5 g/dL Final   ??? HCT 28/41/3244 31.1 (A) 39.0 - 48.0 % Final   ??? MCV 12/29/2020 95.6  77.6 - 95.7 fL Final   ??? MCH 12/29/2020 31.9  25.9 - 32.4 pg Final   ??? MCHC 12/29/2020 33.4  32.0 - 36.0 g/dL Final   ??? RDW 10/26/7251 15.8 (A) 12.2 - 15.2 % Final   ??? MPV 12/29/2020 7.6  6.8 - 10.7 fL Final   ??? Platelet 12/29/2020 172  150 - 450 10*9/L Final   ??? Neutrophils % 12/29/2020 69.9  % Final ??? Lymphocytes % 12/29/2020 8.8  % Final   ??? Monocytes % 12/29/2020 10.0  % Final   ??? Eosinophils % 12/29/2020 10.8  % Final   ??? Basophils % 12/29/2020 0.5  % Final   ??? Absolute Neutrophils 12/29/2020 4.0  1.8 - 7.8 10*9/L Final   ??? Absolute Lymphocytes 12/29/2020 0.5 (A) 1.1 - 3.6 10*9/L Final   ??? Absolute Monocytes 12/29/2020 0.6  0.3 - 0.8 10*9/L Final   ??? Absolute Eosinophils 12/29/2020 0.6 (A) 0.0 - 0.5 10*9/L Final   ??? Absolute Basophils 12/29/2020 0.0  0.0 - 0.1 10*9/L Final

## 2020-12-25 NOTE — Unmapped (Signed)
Discussed recent labs with Nyra Market, NP and Edgar Frisk, PharmD.  Plan is to Make No Changes to IS therapy with repeat labs in with appt on Tuesday, 3/8     - Stop Mg to see if this improves diarrhea  - Discussed elevated Cr with patient's sister, Clydie Braun, discussed patient had labs Monday, after not having dialysis for 2 days ( Saturday and Sunday, last HD Friday.)  She does report Skip is not making much urine at this point.    Patient repots he has ongoing diarrhea, unchanged from when he was inpatient, he has no solid stools.Stop Mg as above. Reminded patient he is on tube feeds and antibiotics that may be contributing to his diarrhea. If no improvement off Mg, could consider adding fiber.     I called [facility] to request Fibrinogen level but no level was drawn, will draw with labs next week. Reminded team patient will need EKG at his appt next week ( monthly ).     Also discussed in detail that patient has had longer dialysis sessions the last three sessions due to hypotension at the end of his sessions. During his last session his BP sitting was 101/70 but when he stood up, dropped to 70/50.   If his BP is igh  prior to HD he does not take midodrine prior to HD session but takes medication with him and takes if needed. If he is low or normal, he takes the midodrine prior to leaving for session.  Skips appetite has improved since discharge and he has been gaining weight but his dry weight goal at the center does not appear to have been adjusted. He reports they removed 2.7 L of fluid during his last session. Per Clydie Braun and patient, patient does not exhibit s/s of FVO ( swelling in the LE/SOB). Discussed with transplant team, Nyra Market reached out to [facility] Nephrology provider to further discuss.   Will continue to follow up.    Mr. Garciagarcia verbalized understanding & agreed with the plan.        Lab Results   Component Value Date    TACROLIMUS 7.5 12/21/2020     Goal: Tac: 6-10  Current Dose: Tac 6 mg BID    Lab Results   Component Value Date    BUN 88 (H) 12/21/2020    CREATININE 6.94 (H) 12/21/2020    K 5.5 (H) 12/21/2020    GLU 103 (H) 12/21/2020    MG 2.4 12/21/2020     Lab Results   Component Value Date    WBC 7.0 12/21/2020    HGB 11.6 (L) 12/21/2020    HCT 38.2 (L) 12/21/2020    PLT 181 12/21/2020    NEUTROABS 5.2 12/21/2020    EOSABS 0.7 (H) 12/21/2020

## 2020-12-29 ENCOUNTER — Ambulatory Visit: Admit: 2020-12-29 | Discharge: 2020-12-30 | Payer: PRIVATE HEALTH INSURANCE

## 2020-12-29 ENCOUNTER — Institutional Professional Consult (permissible substitution): Admit: 2020-12-29 | Discharge: 2020-12-30 | Payer: PRIVATE HEALTH INSURANCE

## 2020-12-29 ENCOUNTER — Ambulatory Visit
Admit: 2020-12-29 | Discharge: 2020-12-30 | Payer: PRIVATE HEALTH INSURANCE | Attending: Adult Health | Primary: Adult Health

## 2020-12-29 DIAGNOSIS — Z941 Heart transplant status: Principal | ICD-10-CM

## 2020-12-29 DIAGNOSIS — B999 Unspecified infectious disease: Principal | ICD-10-CM

## 2020-12-29 DIAGNOSIS — K21 Gastroesophageal reflux disease with esophagitis without hemorrhage: Principal | ICD-10-CM

## 2020-12-29 DIAGNOSIS — T86298 Other complications of heart transplant: Principal | ICD-10-CM

## 2020-12-29 LAB — CBC W/ AUTO DIFF
BASOPHILS ABSOLUTE COUNT: 0 10*9/L (ref 0.0–0.1)
BASOPHILS RELATIVE PERCENT: 0.5 %
EOSINOPHILS ABSOLUTE COUNT: 0.6 10*9/L — ABNORMAL HIGH (ref 0.0–0.5)
EOSINOPHILS RELATIVE PERCENT: 10.8 %
HEMATOCRIT: 31.1 % — ABNORMAL LOW (ref 39.0–48.0)
HEMOGLOBIN: 10.4 g/dL — ABNORMAL LOW (ref 12.9–16.5)
LYMPHOCYTES ABSOLUTE COUNT: 0.5 10*9/L — ABNORMAL LOW (ref 1.1–3.6)
LYMPHOCYTES RELATIVE PERCENT: 8.8 %
MEAN CORPUSCULAR HEMOGLOBIN CONC: 33.4 g/dL (ref 32.0–36.0)
MEAN CORPUSCULAR HEMOGLOBIN: 31.9 pg (ref 25.9–32.4)
MEAN CORPUSCULAR VOLUME: 95.6 fL (ref 77.6–95.7)
MEAN PLATELET VOLUME: 7.6 fL (ref 6.8–10.7)
MONOCYTES ABSOLUTE COUNT: 0.6 10*9/L (ref 0.3–0.8)
MONOCYTES RELATIVE PERCENT: 10 %
NEUTROPHILS ABSOLUTE COUNT: 4 10*9/L (ref 1.8–7.8)
NEUTROPHILS RELATIVE PERCENT: 69.9 %
PLATELET COUNT: 172 10*9/L (ref 150–450)
RED BLOOD CELL COUNT: 3.26 10*12/L — ABNORMAL LOW (ref 4.26–5.60)
RED CELL DISTRIBUTION WIDTH: 15.8 % — ABNORMAL HIGH (ref 12.2–15.2)
WBC ADJUSTED: 5.8 10*9/L (ref 3.6–11.2)

## 2020-12-29 LAB — COMPREHENSIVE METABOLIC PANEL
ALBUMIN: 3.2 g/dL — ABNORMAL LOW (ref 3.4–5.0)
ALKALINE PHOSPHATASE: 178 U/L — ABNORMAL HIGH (ref 46–116)
ALT (SGPT): 33 U/L (ref 10–49)
ANION GAP: 7 mmol/L (ref 5–14)
AST (SGOT): 33 U/L (ref <=34)
BILIRUBIN TOTAL: 0.2 mg/dL — ABNORMAL LOW (ref 0.3–1.2)
BLOOD UREA NITROGEN: 38 mg/dL — ABNORMAL HIGH (ref 9–23)
BUN / CREAT RATIO: 11
CALCIUM: 9.5 mg/dL (ref 8.7–10.4)
CHLORIDE: 96 mmol/L — ABNORMAL LOW (ref 98–107)
CO2: 34 mmol/L — ABNORMAL HIGH (ref 20.0–31.0)
CREATININE: 3.56 mg/dL — ABNORMAL HIGH
EGFR CKD-EPI AA MALE: 20 mL/min/{1.73_m2} — ABNORMAL LOW (ref >=60)
EGFR CKD-EPI NON-AA MALE: 17 mL/min/{1.73_m2} — ABNORMAL LOW (ref >=60)
GLUCOSE RANDOM: 122 mg/dL (ref 70–179)
POTASSIUM: 4 mmol/L (ref 3.5–5.1)
PROTEIN TOTAL: 6.2 g/dL (ref 5.7–8.2)
SODIUM: 137 mmol/L (ref 135–145)

## 2020-12-29 LAB — FIBRINOGEN: FIBRINOGEN LEVEL: 448 mg/dL — ABNORMAL HIGH (ref 177–386)

## 2020-12-29 LAB — PSA: PROSTATE SPECIFIC ANTIGEN: 0.58 ng/mL (ref 0.00–4.00)

## 2020-12-29 LAB — PHOSPHORUS: PHOSPHORUS: 3.2 mg/dL (ref 2.4–5.1)

## 2020-12-29 LAB — MAGNESIUM: MAGNESIUM: 1.7 mg/dL (ref 1.6–2.6)

## 2020-12-29 LAB — TACROLIMUS LEVEL, TROUGH: TACROLIMUS, TROUGH: 10.1 ng/mL (ref 5.0–15.0)

## 2020-12-29 MED ORDER — PANTOPRAZOLE 40 MG TABLET,DELAYED RELEASE
ORAL_TABLET | Freq: Every day | ORAL | 11 refills | 30 days | Status: CP
Start: 2020-12-29 — End: ?

## 2020-12-29 NOTE — Unmapped (Signed)
[facility] PHARMACY NOTE  12/29/2020   Victor Dyer  [id number]    Medication Changes Today:   1. Decrease PPI to daily - hopefully can wean off given pred off and esophagitis resolved per 08/19/20 egd    Education/Adherence Tools Provided Today:  1.provided updated medication list  2. provided additional pill box education    Follow Up Items:  1. ICHID follow     Next visit with pharmacy in 1-2 weeks  ____________________________________________________________________    Victor Dyer is a 65 y.o. male s/p heart transplant on 02/27/2020 (Heart) 2/2 ischemic cardiomyopathy . Patient was maintained on LVAD support prior to transplantation    Other PMH significant for HFrEF, CAD, ESRD (on iHD MWF), STEMI h/x (2018) malnutrition, anemia, low back pain    Seen by pharmacy today for: pill box assessment and adherence education    CC: Patient complaints of continued diarrhea, no worse     Vitals:    12/29/20 1048   BP: 133/81   Pulse: 105   Temp: 36.4 ??C (97.5 ??F)   SpO2: 98%     Allergies   Allergen Reactions   ??? Scopolamine Other (See Comments) and Hallucinations     Induces severe delirium    ??? Ativan [Lorazepam] Other (See Comments) and Hallucinations     Severe somnolence/delirium      All medications reviewed and updated. Medication list includes revisions made during today???s encounter    Current Outpatient Medications   Medication Instructions   ??? aspirin (ECOTRIN) 81 mg, Oral, Daily (standard)   ??? atorvastatin (LIPITOR) 20 mg, Oral, Daily (standard)   ??? azithromycin (ZITHROMAX) 500 mg, Oral, Every 24 hours   ??? calcium carbonate (OS-Dyer) 1,500 mg (600 mg elem calcium) tablet 600 mg of elem calcium, Oral, 2 times a day (standard)   ??? cholecalciferol (vitamin D3-125 mcg (5,000 unit)) 125 mcg, Oral, Daily (standard)   ??? diltiazem (CARDIZEM CD) 120 mg, Oral, Daily (standard)   ??? midodrine (PROAMATINE) 5 mg, Oral, Daily PRN   ??? mirtazapine (REMERON) 45 mg, Oral, Nightly   ??? mycophenolate (MYFORTIC) 180 mg, Oral, 2 times a day   ??? NUZYRA 300 mg, Oral, Daily (standard)   ??? pantoprazole (PROTONIX) 40 mg, Oral, Daily (standard)   ??? SIVEXTRO 200 mg, Oral, Daily (standard)   ??? tacrolimus (PROGRAF) 1 MG capsule Take 1-5 mg capsule (5 mg) with 1-1 mg capsule (1 mg) BID, total dose 6 mg BID   ??? tacrolimus (PROGRAF) 5 MG capsule Take 1-5 mg capsule (5 mg) with 1-1 mg capsule (1 mg) BID, total dose 6 mg BID   ??? torsemide (DEMADEX) 100 MG tablet Please finish 20mg  tablets as directed by prescriber THEN Take 1 tablet (100 mg) by mouth twice daily on non-HD days (Tuesday, Thursday, Saturday and Sunday).     Induction agent: steroids only    CURRENT IMMUNOSUPPRESSION:  ?? Tacrolimus 6 mg QAM and 6 mg QPM (goal level 6-10 ng/mL)   ?? Mycophenolate sodium (Myfortic) 180 mg BID  ?? Prednisone stopped during admission on 08/05/20     Patient is tolerating immunosuppression well    IMMUNOSUPPRESSION DRUG LEVELS:  Lab Results   Component Value Date    Tacrolimus, Trough 9.2 12/08/2020    Tacrolimus, Trough 5.3 09/24/2020    Tacrolimus, Trough 6.2 09/23/2020    Tacrolimus, Timed 7.5 12/21/2020    Tacrolimus, Timed 12.1 12/02/2020    Tacrolimus, Timed 14.0 11/30/2020     Graft Function: Stable  DSA: NTD (03/30/20, 06/27/20, 10/08/20)   Biopsies to Date:08/04/20, 07/07/20   WBC/ANC: WNL     Plan: Will maintain current immunosuppression. Will address tacrolimus dosing pending today's level.      ID Prophylaxis:   CMV Status: D+/ R-, high risk. CMV prophylaxis with Valganciclovir 450 mg every Tuesday and Friday  x 6 months per protocol - therapy complete  PCP/Toxo: Prophylaxis with atovaquone 1500 mg daily x 6 months - therapy complete    CAV Prophylaxis:   Aspirin: asa 81 mg daily   Statin: atorvastatin 20 mg daily   Misc: diltiazem 120 mg PO daily  Plan: Continue aspirin and atorvastatin at doses above.    Blood Pressure:   Goal < 140/90. Encounter vitals reported above  Home BP log: 110s-140s/60s-80s. Systolic in the 140s considered an isolated occurrence.   Current Meds: Midodrine 5 mg daily on dialysis days (MWF), torsemide 100 mg PO BID on non-dialysis days; has received two sessions of HD outpatient since being discharged   Plan: BP within goal and will continue to monitor for hypotension, especially in the setting of iHD     Anemia:  H/H:   Lab Results   Component Value Date    HGB 10.4 (L) 12/29/2020     Lab Results   Component Value Date    HCT 31.1 (L) 12/29/2020     Iron panel:  Lab Results   Component Value Date    IRON 37 (L) 10/18/2020    TIBC 129 (L) 10/18/2020    FERRITIN 3,297.7 (H) 09/25/2020     Lab Results   Component Value Date    Iron Saturation (%) 29 10/18/2020     Prior anemia therapy: N/A  Plan: H/H low but stable. Continue to monitor.     DM:     Lab Results   Component Value Date    A1C <4.0 (L) 09/01/2020     Goal A1c < 7  Currently NOT on DM medications   Home BG Log: N/A   Diet: Appetite is not overly strong but has improved  Exercise: Not addressed   Hypoglycemia: unable to access since patient is no longer checking at home; no reports of hypoglycemic symptoms  Plan:  Continue to monitor    Electrolytes:  wnl  Current Meds: Magnesium oxide 400 mg daily with lunch   Plan: Continue current regimen.    GI/Bowel Movements:  Stable; occasional loose stool   Patient has PEG tube; NP assessed entry site. Patient reports that he recalls taking PPI prior to LVAD.  Current Meds: Miralax 17g daily PRN, bisacodyl suppository 10 mg PRN, pantoprazole 40 mg BID, simethicone 160 mg TID PRN   Plan: Continue to monitor.     Pain:  Patient currently without pain complaints   Current Meds: None   Plan: Continue to monitor     Bone Health:   Patient currently off steroid therapy  Vitamin D Level: last level is 49.2 (10/01/20). Goal > 30  Last DEXA results: Lumbar spine (normal bone density); left proximal femur (mildly low bone density)  Current Medications: calcium carbonate 600 mg (elemental calcium) BID and vitamin D3 cholecalciferol 5,000 units daily  Plan: Continue to monitor     Nutrition Status:  Patient increasing PO intake following prolonged hospitalization   Current Meds: Mirtazapine 45 mg QPM  Plan: Continue to monitor     Sleep:  Patient currently reports no sleep issues   Current Meds: Trazodone 25 mg QPM PRN (has not  needed), Mirtazapine 45 mg QPM   Plan: Continue to monitor     Men's Health:  Daton Szilagyi is a 65 y.o. male. Patient reports no men's/women's health issues  Plan: Continue to monitor     Adherence: Patient has good understanding of medications.  Patient  does not fill their own pill box on a regular basis at home (patient's wife fills pill box)   Patient brought medication card:yes   Pill box:was correct  Patient requested refills for the following meds: N/A  Corrections needed in Epic medication list: none   Plan: Provided moderate adherence counseling/intervention    Spent approximately 20 minutes on educating this patient and greater than 50% was spent in direct face to face counseling regarding post transplant medication education. Questions and concerns were address to patient's satisfaction.    Patient was reviewed with Nyra Market, ARNP who was agreement with the stated plan     During this visit, the following was completed:   -Labs ordered and evaluated  -complex treatment plan >1 DS   -Patient education was completed for 11-24 minutes     All questions/concerns were addressed to the patient's satisfaction.    __________________________________________________    PATIENT SEEN AND EVALUATED BY:  Taha Dimond TEETER Harshitha Fretz, PHARMD, BCPS, CPP  [employer] [facility] PHARMACIST PRACTITIONER  PAGER [phone]

## 2020-12-29 NOTE — Unmapped (Signed)
Decrease pantoprazole to 40 mg daily    Take the midodrine in the car on the way into dialysis to start kicking in.     Keep an eye on your blood pressures as you have then.     We'll plan to see you again when you're here to see infectious disease on 3/29    We'll be in touch when all of your labs are back.    Patient Education        midodrine  Pronunciation:  MY doe drin  What is the most important information I should know about midodrine?  You should not use midodrine if you have severe heart disease, overactive thyroid, an adrenal gland tumor, kidney disease, if you are unable to urinate, or if your blood pressure is high even while lying down.  Midodrine can increase blood pressure even when you are at rest. This medicine should be used only if you have severely low blood pressure that affects your daily life. Midodrine may not improve your ability to perform daily activities.  What is midodrine?  Midodrine works by constricting (narrowing) the blood vessels and increasing blood pressure.  Midodrine is used to treat low blood pressure (hypotension) that causes severe dizziness or a light-headed feeling, like you might pass out. This medicine is for use only when low blood pressure affects daily life. Midodrine may not improve your ability to perform daily activities.  Midodrine may also be used for purposes not listed in this medication guide.  What should I discuss with my healthcare provider before taking midodrine?  You should not use midodrine if you are allergic to it, or if you have:  ?? severe heart disease;  ?? kidney disease, or if you are unable to urinate;  ?? pheochromocytoma (tumor of the adrenal gland);  ?? overactive thyroid; or  ?? high blood pressure even while lying down.  To make sure midodrine is safe for you, tell your doctor if you have:  ?? diabetes;  ?? glaucoma or a history of vision problems;  ?? liver disease; or  ?? a history of kidney disease.  It is not known whether this medicine will harm an unborn baby. Tell your doctor if you are pregnant or plan to become pregnant.  It is not known whether midodrine passes into breast milk or if it could harm a nursing baby. Tell your doctor if you are breast-feeding a baby.  How should I take midodrine?  Follow all directions on your prescription label. Do not take this medicine in larger or smaller amounts or for longer than recommended.  Midodrine is usually taken 3 times per day, with doses spaced at least 3 hours apart. Take your last dose of the day within 3 or 4 hours before bedtime.  You may take midodrine with or without food.  Take this medicine during your normal waking hours, when your are most likely to be upright and not lying down or napping. Ask your doctor about how to take this medicine if you normally lie down during the day.  Midodrine can increase your blood pressure even while you are lying down or sleeping (when blood pressure is usually lowest). Long-term high blood pressure (hypertension) can lead to serious medical problems.  Follow your doctor's instructions about the best way to position your body while you are laying down or sleeping. You may need to keep your head elevated to help prevent high blood pressure.  Your blood pressure will need to be checked before  and during treatment with midodrine. Check your blood pressure while you are lying down, and check it again with your head elevated.  Your kidney and liver function may also need to be checked.  Midodrine is only part of a treatment program that may also include lifestyle changes, wearing support stockings on your legs, and possibly special medical care. Follow your doctor's instructions very closely.  Store at room temperature away from moisture and heat.  What happens if I miss a dose?  Take the missed dose as soon as you remember. Skip the missed dose if it is almost time for your next scheduled dose. Do not take extra medicine to make up the missed dose.  You may need to skip a dose if you will be resting or lying down for a long period of time during your normal waking hours.  Talk to your doctor about how to adjust your dose schedule if needed.  What happens if I overdose?  Seek emergency medical attention or call the Poison Help line at [phone].  Symptoms of a midodrine overdose may include increased blood pressure (flushing, headache, pounding heartbeat, blurred vision), goosebumps, feeling cold, or trouble urinating.  What should I avoid while taking midodrine?  Avoid taking a dose within less than 3 hours before your normal bedtime.  Ask a doctor or pharmacist before using any over-the-counter diet pills, or cough/cold medicine that contains phenylephrine or pseudoephedrine. These medicines may raise your blood pressure.  What are the possible side effects of midodrine?  Get emergency medical help if you have any of these signs of an allergic reaction: hives; difficult breathing; swelling of your face, lips, tongue, or throat.  Stop taking midodrine and call your doctor at once if you have:  ?? severely slowed heart rate --weak pulse, severe dizziness or light-headed feeling; or  ?? dangerously high blood pressure --severe headache, pounding sensation in your ears (hearing your heartbeats), blurred vision, buzzing in your ears, anxiety, confusion, chest pain, shortness of breath, uneven heartbeats, seizure.  Common side effects may include:  ?? chills, goosebumps;  ?? numbness, tingling, or itching (especially in your scalp);  ?? headache, dizziness, tired feeling;  ?? nausea; or  ?? increased urination, painful or difficult urination, or sudden urge to urinate.  This is not a complete list of side effects and others may occur. Call your doctor for medical advice about side effects. You may report side effects to FDA at 1-800-FDA-1088.  What other drugs will affect midodrine?  Taking midodrine with other drugs that constrict blood vessels can further increase your blood pressure. Tell your doctor about all your current medicines and any you start or stop using, especially:  ?? digoxin, digitalis, droxidopa, fludrocortisone  ?? an antidepressant;  ?? asthma medicine;  ?? heart or blood pressure medicine;  ?? migraine headache medicine;  ?? thyroid medicine such as levothyroid or Synthroid;  ?? drugs to treat high blood pressure or a prostate disorder --prazosin, terazosin, or doxazosin; or  ?? an MAO inhibitor --isocarboxazid, linezolid, methylene blue injection, phenelzine, rasagiline, selegiline, tranylcypromine.  This list is not complete. Other drugs may interact with midodrine, including prescription and over-the-counter medicines, vitamins, and herbal products. Not all possible interactions are listed in this medication guide.  Where can I get more information?  Your pharmacist can provide more information about midodrine.  Remember, keep this and all other medicines out of the reach of children, never share your medicines with others, and use this medication only for the  indication prescribed.   Every effort has been made to ensure that the information provided by [employer]') is accurate, up-to-date, and complete, but no guarantee is made to that effect. Drug information contained herein may be time sensitive. [employer] information has been compiled for use by healthcare practitioners and consumers in the [place] and therefore [employer] does not warrant that uses outside of the [place] are appropriate, unless specifically indicated otherwise. [employer]'s drug information does not endorse drugs, diagnose patients or recommend therapy. [employer]'s drug information is an [employer], corporate to assist licensed healthcare practitioners in caring for their patients and/or to serve consumers viewing this service as a supplement to, and not a substitute for, the expertise, skill, knowledge and judgment of healthcare practitioners. The absence of a warning for a given drug or drug combination in no way should be construed to indicate that the drug or drug combination is safe, effective or appropriate for any given patient. [employer] does not assume any responsibility for any aspect of healthcare administered with the aid of information [employer] provides. The information contained herein is not intended to cover all possible uses, directions, precautions, warnings, drug interactions, allergic reactions, or adverse effects. If you have questions about the drugs you are taking, check with your doctor, nurse or pharmacist.  Copyright [phone] [employer] Version: 4.01. Revision date: 12/09/2015.  Care instructions adapted under license by [facility]. If you have questions about a medical condition or this instruction, always ask your healthcare professional. [employer] disclaims any warranty or liability for your use of this information.

## 2020-12-29 NOTE — Unmapped (Signed)
See Pharmacy encounter in clinic this day for additional documentation.

## 2020-12-29 NOTE — Unmapped (Signed)
EKG obtained per order and transmitted into patient's chart.

## 2020-12-29 NOTE — Unmapped (Signed)
Clinical Social Worker Progress Note    Name:Victor Dyer  Date of Birth:[birth year]  ZOX:[id number]    REFERRAL INFORMATION: CSW follows-up with patient as a part of routine care, given recent challenges with home health.     IMPRESSION: Patient arrives to clinic with sister/Victor Dyer. Skip is seated in wheelchair, wearing casual clothes, eyeglasses. Skip is alert, oriented x 4, and participates in conversations. Skip notes that his abdomen isn't feeling well today and admits to feeling distracted by his discomfort. CSW offers to repeat any information shared today at his request/convenience.    Skip describes this his mood is fine. He describes interest in pleasurable activities to include watching [employer] [address] [employer] and [address]     Skip has had visit from PT and scheduled care with PT again this afternoon.     Skip and Victor Dyer describe challenges with communication with dialysis team about patient's individual treatments and look forward to monthly plan of care meeting next week. CSW encourages self-advocacy and to ask all their questions.     Skip shares concern that dialysis center was very forceful trying to get him to sign a form, of which they wouldn't allow him to make a copy or share with his sister. In recollection, he believes it may have been the 2728.  CSW shared information about purpose of 2728, reviewed ESRD Medicare benefit, Medicare coverage, Medicare Supplemental Policies, [place] [facility] counselors, and ESRD Medicare Booklet.  CSW reminded patient that he wanted to speak with HR to learn more about how his retirement insurance benefit continued (or ended) with Medicare eligibility. CSW encourages patient to utilize local resources including dialysis center Child psychotherapist and Presenter, broadcasting.     Skip inquires about handicapped parking placard which will expire in April.  CSW reaches out to provider about assistance with completing updated request.     PLAN OF CARE:  No additional acute psychosocial needs identified. CSW services remain available.      Victor Dyer, CCTSW  Clinical Social Worker  [facility]  1:54 PM 12/29/2020        CM met with patient in pt room.  Pt/visitors were wearing hospital provided masks for the duration of the interaction with CM.   CM was wearing hospital provided surgical mask.  CM was within 6 foot of the patient/visitors during this interaction.

## 2020-12-29 NOTE — Unmapped (Signed)
Per provider, patient received his booster dose of the Pfizer vaccine. Patient ID verified with Name and Date of birth. The [employer] Covid-19 Fact Sheet given to patient. All screening questions answered. Verbal consent taken. Vaccine administered as ordered.  Please see immunization history for documentation.  Patient tolerated the injection well with no issues noted. Patient remained in observation for 15 minutes with no complications or complaints noted.

## 2020-12-31 LAB — HEARTCARE
ALLOMAP SCORE: 32
ALLOSURE: <0.12 %

## 2020-12-31 NOTE — Unmapped (Signed)
Discussed recent labs with Nyra Market, NP and Edgar Frisk, PharmD.  Plan is to Make No Changes  with repeat labs in 1 Week with fibrinogen     Patient was seen in clinic, labs not resulted at that time.    Sent patient MyChart message with results and follow up plan      Lab Results   Component Value Date    TACROLIMUS 10.1 12/29/2020     Goal: Tac: 6-10  Current Dose: Tacrolimus 6 mg BID    Lab Results   Component Value Date    BUN 38 (H) 12/29/2020    CREATININE 3.56 (H) 12/29/2020    K 4.0 12/29/2020    GLU 122 12/29/2020    MG 1.7 12/29/2020     Lab Results   Component Value Date    WBC 5.8 12/29/2020    HGB 10.4 (L) 12/29/2020    HCT 31.1 (L) 12/29/2020    PLT 172 12/29/2020    NEUTROABS 4.0 12/29/2020    EOSABS 0.6 (H) 12/29/2020

## 2021-01-04 ENCOUNTER — Other Ambulatory Visit (HOSPITAL_COMMUNITY)
Admission: RE | Admit: 2021-01-04 | Discharge: 2021-01-04 | Disposition: A | Discharge status: Home / Self Care | Payer: 59 | Source: Ambulatory Visit | Attending: Adult Health Nurse Practitioner | Admitting: Adult Health Nurse Practitioner

## 2021-01-04 DIAGNOSIS — Z79899 Other long term (current) drug therapy: Secondary | ICD-10-CM | POA: Insufficient documentation

## 2021-01-04 DIAGNOSIS — Z48298 Encounter for aftercare following other organ transplant: Secondary | ICD-10-CM | POA: Insufficient documentation

## 2021-01-04 DIAGNOSIS — R7989 Other specified abnormal findings of blood chemistry: Secondary | ICD-10-CM | POA: Insufficient documentation

## 2021-01-04 DIAGNOSIS — B259 Cytomegaloviral disease, unspecified: Secondary | ICD-10-CM | POA: Insufficient documentation

## 2021-01-04 DIAGNOSIS — Z941 Heart transplant status: Secondary | ICD-10-CM | POA: Diagnosis present

## 2021-01-04 LAB — CBC WITH DIFFERENTIAL/PLATELET
Abs Immature Granulocytes: 0.03 10*3/uL (ref 0.00–0.07)
Basophils Absolute: 0 10*3/uL (ref 0.0–0.1)
Basophils Relative: 0 %
Eosinophils Absolute: 0.9 10*3/uL — ABNORMAL HIGH (ref 0.0–0.5)
Eosinophils Relative: 12 %
HCT: 35.1 % — ABNORMAL LOW (ref 39.0–52.0)
Hemoglobin: 11.3 g/dL — ABNORMAL LOW (ref 13.0–17.0)
Immature Granulocytes: 0 %
Lymphocytes Relative: 8 %
Lymphs Abs: 0.5 10*3/uL — ABNORMAL LOW (ref 0.7–4.0)
MCH: 31.9 pg (ref 26.0–34.0)
MCHC: 32.2 g/dL (ref 30.0–36.0)
MCV: 99.2 fL (ref 80.0–100.0)
Monocytes Absolute: 0.6 10*3/uL (ref 0.1–1.0)
Monocytes Relative: 8 %
Neutro Abs: 4.9 10*3/uL (ref 1.7–7.7)
Neutrophils Relative %: 72 %
Platelets: 153 10*3/uL (ref 150–400)
RBC: 3.54 MIL/uL — ABNORMAL LOW (ref 4.22–5.81)
RDW: 14.8 % (ref 11.5–15.5)
WBC: 6.9 10*3/uL (ref 4.0–10.5)
nRBC: 0 % (ref 0.0–0.2)

## 2021-01-04 LAB — BASIC METABOLIC PANEL
Anion gap: 13 (ref 5–15)
BUN: 83 mg/dL — ABNORMAL HIGH (ref 8–23)
CO2: 28 mmol/L (ref 22–32)
Calcium: 9.5 mg/dL (ref 8.9–10.3)
Chloride: 97 mmol/L — ABNORMAL LOW (ref 98–111)
Creatinine, Ser: 7.17 mg/dL — ABNORMAL HIGH (ref 0.61–1.24)
GFR, Estimated: 8 mL/min — ABNORMAL LOW (ref >60)
Glucose, Bld: 100 mg/dL — ABNORMAL HIGH (ref 70–99)
Potassium: 4.8 mmol/L (ref 3.5–5.1)
Sodium: 138 mmol/L (ref 135–145)

## 2021-01-04 LAB — MAGNESIUM: Magnesium: 2.2 mg/dL (ref 1.7–2.4)

## 2021-01-05 LAB — BASIC METABOLIC PANEL
ANION GAP: 13 (ref 5–15)
BLOOD UREA NITROGEN: 83 mg/dL — ABNORMAL HIGH (ref 8–23)
CALCIUM: 9.5 mg/dL (ref 8.9–10.3)
CHLORIDE: 97 mmol/d — ABNORMAL LOW (ref 98–111)
CO2: 28 mmol/L (ref 22–32)
CREATININE: 7.17 mg/(24.h) — ABNORMAL HIGH (ref 0.61–1.24)
EGFR CKD-EPI NON-AA MALE: 8 mL/min — ABNORMAL LOW
GLUCOSE RANDOM: 100 mg/dL — ABNORMAL HIGH (ref 70–99)
POTASSIUM: 4.8 mmol/L (ref 3.5–5.1)
SODIUM: 138 mmol/L (ref 135–145)

## 2021-01-05 LAB — CBC W/ DIFFERENTIAL
BASOPHILS ABSOLUTE COUNT: 0 10*3/uL (ref 0.0–0.1)
BASOPHILS RELATIVE PERCENT: 0 %
EOSINOPHILS ABSOLUTE COUNT: 0.9 10*3/uL — ABNORMAL HIGH (ref 0.0–0.5)
EOSINOPHILS RELATIVE PERCENT: 12 %
HEMATOCRIT: 35.1 — ABNORMAL LOW (ref 39.0–52.0)
HEMOGLOBIN: 11.3 g/dL — ABNORMAL LOW (ref 13.0–17.0)
IMMATURE CELLS: 0
LYMPHOCYTES ABSOLUTE COUNT: 0.5 10*3/uL — ABNORMAL LOW (ref 0.7–4.0)
LYMPHOCYTES RELATIVE PERCENT: 8 %
MEAN CORPUSCULAR HEMOGLOBIN CONC: 32.2 g/dL (ref 30.0–36.0)
MEAN CORPUSCULAR HEMOGLOBIN: 31.9 pg (ref 26.0–34.0)
MEAN CORPUSCULAR VOLUME: 99.2 fL (ref 80.0–100.0)
MONOCYTES ABSOLUTE COUNT: 0.6 10*3/uL (ref 0.1–1.0)
MONOCYTES RELATIVE PERCENT: 8 %
NEUTROPHILS ABSOLUTE COUNT: 4.9 10*3/uL (ref 1.7–7.7)
NEUTROPHILS RELATIVE PERCENT: 72 %
PLATELET COUNT: 153 10*3/uL (ref 150–400)
RED BLOOD CELL COUNT: 3.54 MIL/UL — ABNORMAL LOW (ref 4.22–5.81)
RED CELL DISTRIBUTION WIDTH: 14.8 % (ref 11.5–15.5)
WHITE BLOOD CELL COUNT: 6.9 10*3/uL (ref 4.0–10.5)

## 2021-01-05 LAB — MAGNESIUM: MAGNESIUM: 2.2 mg/dL (ref 1.7–2.4)

## 2021-01-06 LAB — TACROLIMUS LEVEL: Tacrolimus (FK506) - LabCorp: 9.4 ng/mL (ref 2.0–20.0)

## 2021-01-06 NOTE — Unmapped (Signed)
Received call from Dietitian at [facility] [phone]) regarding Patient. Returned call same day. Joy wanted to know more about Victor Dyer's nutrition history (she hadn't seen RD notes from most recent hospitalization) so provided her with more detailed information about his transplant and subsequent admission as it related to nutrition. She inquired about his home TF regimen and plan. We discussed that once he reaches >150# likely could attempt to see if patient can continue to gain weight without enteral feeds. Would like patient to reach 165# and maintain for 3 months prior to removing j-tube.     Most recent data provided by Clydie Braun was on 3/13 which she sends every week:       Avg Cummulative Daily Calories Avg Cummulative Daily Protein Avg Cummulative Daily Weight        Dialysis Days  2,138 93 139.5             Averages run Sunday thru Saturday       Calories Protein Weight Weekly Avg Calories Weekly Avg Protein Weekly Avg Weight Tube feed cartons/week    Sun 12/27/20 2,001 88 141.2 2,174 92 142.4 14    Mon 12/28/20 2,510 100 143.0       Clinic Visit Tues 12/29/20 2,059 82 144.4        Weds 12/30/20 1,969 91 144.6        Thurs 12/31/20 1,751 71 141.2        Fri 01/01/21 2,194 93 142.0        Sat 01/02/21 2,736 116 140.2            Averages run Sunday thru Saturday     Weekly Avg Calories Weekly Avg Protein Weekly Avg Weight Tube feed cartons/week          Feb 13-19 2,264 97 137.3 21   Feb 20-26 2,224 93 138.6 16   Feb 27-Mar 5 1,970 91 140.6 15   Mar 6-Mar 12 2,174 92 142.4 14       Currently Victor Dyer is using 2 cartons of Novasource Renal per night and will continue to do for now.    Victor Nations, MS, RD, LD, CCTD  Heart/Lung Transplant, VAD Dietitian  pgr [phone]; ph [phone]

## 2021-01-07 LAB — CMV DNA BY PCR, QUALITATIVE: CMV DNA, Qual PCR: NEGATIVE

## 2021-01-07 MED ORDER — MYCOPHENOLATE SODIUM 180 MG TABLET,DELAYED RELEASE
ORAL_TABLET | Freq: Two times a day (BID) | ORAL | 11 refills | 30.00000 days
Start: 2021-01-07 — End: ?

## 2021-01-11 ENCOUNTER — Other Ambulatory Visit (HOSPITAL_COMMUNITY)
Admission: RE | Admit: 2021-01-11 | Discharge: 2021-01-11 | Disposition: A | Discharge status: Home / Self Care | Payer: 59 | Source: Ambulatory Visit | Attending: Cardiology | Admitting: Cardiology

## 2021-01-11 DIAGNOSIS — Z48298 Encounter for aftercare following other organ transplant: Secondary | ICD-10-CM | POA: Insufficient documentation

## 2021-01-11 DIAGNOSIS — Z941 Heart transplant status: Secondary | ICD-10-CM | POA: Diagnosis present

## 2021-01-11 DIAGNOSIS — R7989 Other specified abnormal findings of blood chemistry: Secondary | ICD-10-CM | POA: Insufficient documentation

## 2021-01-11 DIAGNOSIS — Z79899 Other long term (current) drug therapy: Secondary | ICD-10-CM | POA: Diagnosis not present

## 2021-01-11 LAB — TACROLIMUS LEVEL: TACROLIMUS, TROUGH: 9.4 ng/mL (ref <=20.0)

## 2021-01-11 LAB — BASIC METABOLIC PANEL
Anion gap: 11 (ref 5–15)
BUN: 78 mg/dL — ABNORMAL HIGH (ref 8–23)
CO2: 31 mmol/L (ref 22–32)
Calcium: 9.6 mg/dL (ref 8.9–10.3)
Chloride: 95 mmol/L — ABNORMAL LOW (ref 98–111)
Creatinine, Ser: 6.84 mg/dL — ABNORMAL HIGH (ref 0.61–1.24)
GFR, Estimated: 8 mL/min — ABNORMAL LOW (ref >60)
Glucose, Bld: 134 mg/dL — ABNORMAL HIGH (ref 70–99)
Potassium: 5.1 mmol/L (ref 3.5–5.1)
Sodium: 137 mmol/L (ref 135–145)

## 2021-01-11 LAB — CBC WITH DIFFERENTIAL/PLATELET
Abs Immature Granulocytes: 0.05 10*3/uL (ref 0.00–0.07)
Basophils Absolute: 0 10*3/uL (ref 0.0–0.1)
Basophils Relative: 0 %
Eosinophils Absolute: 0.8 10*3/uL — ABNORMAL HIGH (ref 0.0–0.5)
Eosinophils Relative: 12 %
HCT: 36.6 % — ABNORMAL LOW (ref 39.0–52.0)
Hemoglobin: 11.5 g/dL — ABNORMAL LOW (ref 13.0–17.0)
Immature Granulocytes: 1 %
Lymphocytes Relative: 9 %
Lymphs Abs: 0.6 10*3/uL — ABNORMAL LOW (ref 0.7–4.0)
MCH: 31.5 pg (ref 26.0–34.0)
MCHC: 31.4 g/dL (ref 30.0–36.0)
MCV: 100.3 fL — ABNORMAL HIGH (ref 80.0–100.0)
Monocytes Absolute: 0.6 10*3/uL (ref 0.1–1.0)
Monocytes Relative: 9 %
Neutro Abs: 4.9 10*3/uL (ref 1.7–7.7)
Neutrophils Relative %: 69 %
Platelets: 199 10*3/uL (ref 150–400)
RBC: 3.65 MIL/uL — ABNORMAL LOW (ref 4.22–5.81)
RDW: 14.6 % (ref 11.5–15.5)
WBC: 7 10*3/uL (ref 4.0–10.5)
nRBC: 0 % (ref 0.0–0.2)

## 2021-01-11 LAB — MAGNESIUM: Magnesium: 2.2 mg/dL (ref 1.7–2.4)

## 2021-01-11 NOTE — Unmapped (Signed)
Discussed recent labs with Nyra Market, NP and Edgar Frisk, PharmD.  Plan is to Make No Changes with repeat labs in 1 Week with his appointment.    Contacted [facility] and fibrinogen level was not drawn per orders. Will repeat labs with appointment next Tuesday, including fibrinogen    Secure MyChart message sent.      Lab Results   Component Value Date    TACROLIMUS 9.4 01/04/2021     Goal: Tac: 6-10  Current Dose: 6mg  BID    Lab Results   Component Value Date    BUN 83 (H) 01/04/2021    CREATININE 7.17 (H) 01/04/2021    K 4.8 01/04/2021    GLU 100 (H) 01/04/2021    MG 2.2 01/04/2021     Lab Results   Component Value Date    WBC 6.9 01/04/2021    HGB 11.3 (L) 01/04/2021    HCT 35.1 (L) 01/04/2021    PLT 153 01/04/2021    NEUTROABS 4.9 01/04/2021    EOSABS 0.9 (H) 01/04/2021

## 2021-01-13 LAB — TACROLIMUS LEVEL: Tacrolimus (FK506) - LabCorp: 7.5 ng/mL (ref 2.0–20.0)

## 2021-01-13 LAB — CMV DNA BY PCR, QUALITATIVE: CMV DNA, Qual PCR: NEGATIVE

## 2021-01-18 NOTE — Unmapped (Signed)
Heart Transplant Clinic Follow Up Note    Referring Provider: Samuella Cota, MD   Primary Provider: Gelene Mink, MD   Transplant Cardiology Provider:  Carin Hock, MD    Reason for Visit:  Victor Dyer is a 65 y.o. male being seen for continuity of care.         -- Heart transplant/Immunosuppression. On dual immunosuppression therapy with tacrolimus (goal 6-10) and Myfortic 180 mg BID. His levels are pending today. Previously discussed option of mTOR therapy for him vs. Stopping myfortic and continuing tacrolimus monotherapy.   PFTs pending from today   He's due for his first annual LHC/RHC/Bx in the next month or so; will arrange to assess for CAV.    -- Infection. 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 Seen today by [facility] who stopped his abx regimen (Tedizolid, azithromycin, omadacycline). Reiterated the importance of notifying his txp team ASAP if he develops any recurrent symptoms of infection.     -- Hypertension. On Midodrine for BP mgt on HD days (noted hypotensive episodes which are improved when he takes Midodrine). He was on diltiazem 12 hour preparation, recently switched to 24 hour preparation which is improving his daytime BPs on non-HD days but seems to correlate w/ increased midodrine requirements on HD days.   Instructed to hold his diltiazem the night BEFORE HD sessions to avoid hypotension during dialysis sessions and asked to continue monitoring his BPs at home.     -- Respiratory. DC home without trach but w/ O2 to use at night and PRN through the day.  Complete PFTs today. Last Chest CT 09/30/20 showed slightly smaller moderate right pleural effusion.      -- Appetite/weight management/GI symptoms. On Mirtazapine 45 mg daily with improving appetite. Using tube feeds at night and is supplementing with PO intake as tolerated. Remains on Pantoprazole 40 mg daily for esophagitis/chronic GERD symptoms. He's frustrated re: the tube and requiring feeds, eating about 1000 cal/day. Discussed w/ dietitian who recommended continuing feeds til his weight is closer to 155 lbs and he continues to gain weight. Relayed this to Skip.     -- ESRD on dialysis. Creatinine today is pending and he remains on HD Mon/Wed/Fri.  Minimal urine output at this time.      -- Hyperlipidemia. Pre-txp on atorvastatin 80 mg daily. On atorvastatin 20 mg daily. LDL 09/01/20 was 72.    -- Mood. Improved today on Mirtazapine 45 mg daily along w/ increasing his activity as tolerated. Continue to monitor.     -- Health Maintenance/Misc:   Activity: stressed benefit of increasing his activity/exerise at home.   ENT: discussed that his hoarse voice may be related to vocal cord irritation. May refer to ENT PRN.  Dental: may have anytime, no pre-procedure abx required  Eye: due within a year  Dermatology: He has noted a fatty feeling, soft nodule about 2 cm in size behind his left shoulder blade. Appears consistent w/ a lipoma. Asked to reschedule his FU appointment with dermatology for evaulation.  CXR: 10/07/20, stable bilateral pleural effusion  Bone density: 05/18/18 w/ normal lumbar spine, decreased femur density  PSA: 12/29/20 was 0.58  Colonoscopy: 05/11/18 with 5 polyps 3-6 mm in sigmoid colon. Recommendation was for repeat in 2021 (due to R polyps over 2 successive colonoscopies and suboptimal prep on each colonoscopy)  ID: Flu 07/23/20, Pneumovax 05/20/20, Tetanus 10/03/18  COVID: 01/09/20, 01/30/20, 07/28/20, 12/29/20    Return to clinic: LHC/RHC/Bx  in April 2022       History of Present Illness:  Victor Dyer is a 65 y.o. male with underwent a heart transplantation for ischemic cardiomyopathy on 02/27/20. Please see below for prolonged post-txp course.     Most recently seen in clinic 12/29/20. He was asked to take his midodrine prior to his HD sessions to help w/ avoiding hypotension. Pantoprazole was decreased to daily.    Returns for follow up along w/ [facility] ID. The [facility] ID team saw him this morning and plan to stop his abx regimen since he's doing well. Despite taking the midodrine w/ HD sessions his BPs are dropping. He increased to 10 mg and the past couple sessions he isn't dropping anymore. BPs at home are starting to normalize. In looking at his Rx at home, appears he had diltiazem 12 hour preparation and recently transitioned to 24 hour formulation. After switching, his overall BPs are improved through the day.  Still winded w/ activity, but stable tolerance. Working w/ home PT but hasn't been really pushing his activity/exercise at home. Had an episode of emesis in setting of coughing/sneezing but otherwise no issues. No GERD, but he's quite hesitant to stop his PPI as he has been on this for years.   Tolerating his tube feeds at night, appetite during the day is improving. He said he's eating 'great' but Clydie Braun said he's still eating about 1000 cal/day.   Denies any chest pain, palpitations, orthopnea, PND. No fever, chills, sweats, nausea, vomiting, diarrhea. No dark/tarry stools, BRBPR, epistaxis.       Cardiac Transplant History and Surveillance Testing: CMV D+/R-, EBV D+/R-, Toxo D-/R-, Hep C Ab -   As previously reviewed: pre-transplant, he had history of ischemic cardiomyopathy (presenting as anterior STEMI in 01/2017 with severe 3v CAD and severe LV dysfunction), s/p PCI (2018), s/p HeartMate 3 placement 06/04/2018. ??His post-transplant course has been complicated by coagulopathy s/p washout and delayed chest closure on 5/8, followed by post-extubation respiratory failure in the setting of copious bloody secretions and mucus, with large R sided pleural effusion, resulting in re-intubation on 5/14. ??IP was consulted to insert R chest tube with >700 cc fluid out, and bronch confirmed GNR pneumonia. ??5/14 BAL grew >1,000,000 CFU/mL Serratia marcescens, and pleural fluid grew Candida parapsilosis, for which he is currently on cefepime, vancomycin, ampho B, micafungin. ??His 1st echo on 5/13 showed normal biventricular function, and he easily weaned off inotropic support. ??His 1st heart biopsy was delayed from 5/14 to 5/17 because of his respiratory status. ??He has also had impressively elevated BPs which had been treated with clevidipine and diltiazem pre-re-intubation. ??Had difficulty extubating due to significant secretions in setting of PNA and R hemidiaphragm paralysis. ??5/17 1st EMBx was favorable (ISHLT 0R), and confirmed R hemidiaphragm paralysis (5/16 NIF was -10), and RIJ MAC was removed, and LIJ 3L CL was placed for venous access. ??Repeat 5/18 Bronch/BAL (to reassess extubation ability) still showed thick secretions, + Serratia (though less: 300k CFU/ml from >1.51M) but also + Burkholderia. Eventually got R sided chest tube with fluid growing Candida. Completed course of cefepime/ceftaz and mica/fluconazole. S/p trach/PEG on 5/19, with trach collar trials going well overall and last week he was tolerating up 12 hrs (interrupted some by GI surgeries). Last BAL on 6/5 grew Burkholderia, therefore restarted on ABX per ID - minocycline??and meropenem. ????With therapeutic Tac levels, he had progressive ARF with Cr >2 starting 5/22, peak Cr 5.99 on 5/26. ??He was started on HD/CRRT 5/26 with  3L UF by 5/27 with decreasing Cr (3.16 at 2nd EMBx 5/27, when Tac level 8.0 [range 8-14 x 1 week]), and able to lay flat. EMBx #3, 6/3 - 0R; RA mean 13, wedge 18, cardiac index 3.2, PA mean 30. ??PA sat 55%. ??He unfortunately had peritonitis and a perforated G tube requiring surgical removal of old G tube with patching, GJ placement 5/31 and redo surgery for debridement of midline incision facial edges 6/4 in setting of wound dehiscence. ??On 6/12 there was concern for worsening infection due to rise in WBC and soft Bps. Abx were broadened for peritonitis coverage. He was volume up and has intemittently needed agressive fluid removal with CRRT. Has now been transitioned to [facility] and tolerating this well. He became septic again  with blood cultures that were positive for Burkholderia again (04/06/20) , raising concern about poor absorption of abx. He was switched to IV minocycline and IV bactrim was added with improvement. Tolerated aggressive fluid removal after that with improved blood pressures and he was transitioned to iHD, passed swallow eval and was in the step down for a period of time doing well. Had a setback requiring return to CICU for CRRT due to hypoxia. Since than he has unfortunately continued to require nocturnal ventilator support and is at this point able to tolerate trach collar for about 13 hours. 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 This has now healed well and he is tolerating PO intake. He is decannulated from trach 07/27/20. He was discharged to [facility] 08/04/20 and discharged home 08/20/20.    Left Heart Cath / Stress Tests:  ?? n/a    Echo:  ?? 05/15/20: LVEF >55%  ?? 08/27/20: LVEF 55-60%  ?? 09/21/20: LVEF 50-55%  ?? 01/19/21: Pending    Rejection History:   ?? n/a             DSA:   ?? 03/30/20: No DSA  ?? 06/27/20: No DSA  ?? 10/08/20: No DSA  ?? 12/08/20: No DSA    Past Medical History:  Past Medical History:   Diagnosis Date   ??? Anemia    ??? Arthritis    ??? Chronic systolic heart failure (CMS-HCC)     05/2018: LVAD- [place] ([facility] to Transplant)   ??? Coronary artery disease     01/2017: DES to RCA x2 / 02/2017: DES to LCx x3   ??? Gout    ??? Hiatal hernia    ??? Inguinal hernia     Right   ??? Paroxysmal atrial fibrillation (CMS-HCC) 03/26/2018    12/13/2019: No A-Fib on Device Interrogation   ??? Scoliosis     Neck   ??? Shingles    ??? Skin cancer, basal cell    ??? STEMI (ST elevation myocardial infarction) (CMS-HCC) 01/2017     Post-Transplant Hospitalization History:   ?? 09/19/20-12/03/20:  Presented to his local ED with fluid overload, chest congestion. Orders placed to transfer him to [facility]. 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 [facility] ID follow him through the hospitalization; his HD line was replaced. Bronch grew pseudomonas aerginosas (likely colonization). His abx were adjusted per [facility] ID team, discharged home w/ Tedizolid 200 mg daily, omadacycline 300 mg daily and azithromycin 500 mg daily x 3 months (through 01/20/21). E was switched from Mycophenolate to myfortic d/t GI symptoms. A gastric emptying study 12/7 showed no evidence of delayed emptying. He continued to  supplement his po intake w/ tube feeds. From a volume perspective, he was managed w/ dialysis and torsemide 100 mg BID on non-HD days. Psychiatry saw him; escitalopram was stopped d/t prolonged QTC prior to DC; continued on mirtazapine 45 mg daily.  In 09/2020 he developed some thrombocytopenia (thought to be related to abx esp linezolid, which was stopped 10/09/20). From a respiratory perspective, pulmonary bronched him 12/6  And chest CT 12/8 showed decreased pleural effusions. He was treated w/ Epo for his anemia; received unit pRBC 12/19. Despite severe deconditioning, he did not qualify for insurance coverage of AIR. He was discharged home 12/03/20 with home PT/OT support.      Past Surgical History:   Past Surgical History:   Procedure Laterality Date   ??? LEFT VENTRICULAR ASSIST DEVICE  06/04/2018   ??? MOHS SURGERY     ??? PR BRONCHOSCOPY,DIAGNOSTIC W LAVAGE Bilateral 02/27/2020    Procedure: Bronchoscopy, Rigid Or Flexible, Include Fluoroscopic Guidance When Performed; W/Bronchial Alveolar Lavage;  Surgeon: Loletta Specter, MD;  Location: MAIN OR [facility];  Service: Cardiac Surgery   ??? PR BRONCHOSCOPY,DIAGNOSTIC W LAVAGE Bilateral 09/28/2020    Procedure: BRONCHOSCOPY, RIGID OR FLEXIBLE, INCLUDE FLUOROSCOPIC GUIDANCE WHEN PERFORMED; W/BRONCHIAL ALVEOLAR LAVAGE WITH MODERATE SEDATION;  Surgeon: Mercy Moore, MD;  Location: BRONCH PROCEDURE LAB [facility];  Service: Pulmonary   ??? PR CATH PLACE/CORON ANGIO, IMG SUPER/INTERP,W LEFT HEART VENTRICULOGRAPHY N/A 01/30/2017    Acute anterior MI. Severe 3-V CAD. Severe LV dysfunction. Bilateral inguinal hernias.   ??? PR CATH PLACE/CORON ANGIO, IMG SUPER/INTERP,W LEFT HEART VENTRICULOGRAPHY N/A 02/07/2017    Significant 3-vessel coronary artery disease. There is complete occlusion of the ostial LAD. The LCx has severe disease of both OM1 and OM2. Decreased LV function by previous MRI. EF estimated 31%. LVEDP 30 mmHg (this was checked due to to CHF symptoms). PCI was performed of the proximal 90 RCA lesion using Synergy (drug eluting stent) x2. Excellent angiographic result with TIMI 3 flow    ??? PR CATH PLACE/CORON ANGIO, IMG SUPER/INTERP,W LEFT HEART VENTRICULOGRAPHY N/A 03/13/2017    Procedure: Left Heart Catheteriation with possible revascularization;  Surgeon: Lisabeth Devoid, MD;  Location: [place] CATH;  Service: Cardiology   ??? PR COLONOSCOPY FLX DX W/COLLJ SPEC WHEN PFRMD N/A 05/11/2018    Procedure: COLONOSCOPY, FLEXIBLE, PROXIMAL TO SPLENIC FLEXURE; DIAGNOSTIC, W/WO COLLECTION SPECIMEN BY BRUSH OR WASH;  Surgeon: Zetta Bills, MD;  Location: MAIN OR [facility];  Service: Gastroenterology   ??? PR COLSC FLEXIBLE W/CONTROL BLEEDING ANY METHOD N/A 12/21/2017    Procedure: COLONOSCOPY with polypectomy , resolution clip;  Surgeon: Olevia Bowens, MD;  Location: ENDO PROCEDURES [place];  Service: Gastroenterology   ??? PR COLSC FLX W/RMVL OF TUMOR POLYP LESION SNARE TQ N/A 05/11/2018    Procedure: COLONOSCOPY FLEX; W/REMOV TUMOR/LES BY SNARE;  Surgeon: Zetta Bills, MD;  [facility];  Service: Gastroenterology   ??? PR ENDOSCOPY UPPER SMALL INTESTINE N/A 05/11/2020    Procedure: SMALL INTESTINAL ENDOSCOPY, ENTEROSCOPY BEYOND SECOND PORTION OF DUODENUM, NOT INCL ILEUM; DX, INCL COLLECTION OF SPECIMEN(S) BY BRUSHING OR WASHING, WHEN PERFORMED;  Surgeon: Vonda Antigua, MD;  Location: [facility];  Service: Gastroenterology   ??? PR EXPLOR LIVER WOUND,EXTENSIV DEBRIDE N/A 03/22/2020 Procedure: Mgmt Liver Hemorr; Explor/Exten Debrid/Coag/Sut;  Surgeon: Merri Ray, MD;  Location: MAIN OR [facility];  Service: Trauma   ??? PR EXPLOR POSTOP BLEED,INFEC,CLOT-CHST N/A 02/29/2020    Procedure: EXPLOR POSTOP HEMORR THROMBOSIS/INFEC; CHEST;  Surgeon: Hanley Hays  Sindy Guadeloupe, MD;  Location: MAIN OR [facility];  Service: Cardiac Surgery   ??? PR EXPLORATORY OF ABDOMEN N/A 03/22/2020    Procedure: Exploratory laparotomy, repair of G-tube site, washout, placement of new G-J tube;  Surgeon: Merri Ray, MD;  Location: MAIN OR [facility];  Service: Trauma   ??? PR EXPLORATORY OF ABDOMEN N/A 03/27/2020    Procedure: EXPLORATORY LAPAROTOMY, EXPLORATORY CELIOTOMY WITH OR WITHOUT BIOPSY(S);  Surgeon: Katherina Mires, MD;  Location: MAIN OR [facility];  Service: Trauma   ??? PR INSERT VENT ASST DEV,IMPLANT,SINGLE VENT Midline 06/04/2018    Procedure: INSERTION OF VENTRICULAR ASSIST DEVICE, IMPLANTABLE INTRACORPOREAL, SINGLE VENTRICLE;  Surgeon: Lennie Odor, MD;  Location: MAIN OR [facility];  Service: Cardiac Surgery   ??? PR INSJ/RPLCMT PERM DFB W/TRNSVNS LDS 1/DUAL CHMBR N/A 08/23/2017    Procedure: IMPLANTATION OF INTERNAL CARDIAC DEFIBRILLATOR;  Surgeon: Paulla Dolly, MD;  Location: [place] [facility];  Service: Cardiology   ??? PR PERQ DRAINAGE PLEURA INSERT CATH W/IMAGING Right 03/06/2020    Procedure: PLEURAL DRAINAGE, PERC, W INSERTION OF INDWELLING CATHETER; W IMAGING GUIDANCE;  Surgeon: Wilfrid Lund, DO;  Location: BRONCH PROCEDURE LAB [facility];  Service: Pulmonary   ??? PR PLACE PERCUT GASTROSTOMY TUBE Left 03/11/2020    Procedure: Kayleen Memos Endo; W/Directed Plcmt Perq Gastrostomy Tube;  Surgeon: Evert Kohl, MD;  Location: MAIN OR [facility];  Service: Thoracic   ??? PR RIGHT HEART CATH O2 SATURATION & CARDIAC OUTPUT N/A 06/22/2017    Procedure: Right Heart Catheterization;  Surgeon: Vernetta Honey, MD;  Location: [place] CATH;  Service: Cardiology   ??? PR RIGHT HEART CATH O2 SATURATION & CARDIAC OUTPUT N/A 05/08/2018    Procedure: Right Heart Catheterization;  Surgeon: Carin Hock, MD;  Location: [facility] CATH;  Service: Cardiology   ??? PR RIGHT HEART CATH O2 SATURATION & CARDIAC OUTPUT N/A 09/04/2018    Procedure: Right Heart Catheterization;  Surgeon: Carin Hock, MD;  Location: [facility] CATH;  Service: Cardiology   ??? PR RIGHT HEART CATH O2 SATURATION & CARDIAC OUTPUT N/A 11/12/2019    Procedure: Right Heart Catheterization;  Surgeon: Carin Hock, MD;  Location: [facility] CATH;  Service: Cardiology   ??? PR RIGHT HEART CATH O2 SATURATION & CARDIAC OUTPUT N/A 03/09/2020    Procedure: Right Heart Catheterization W Biopsy;  Surgeon: Liliane Shi, MD;  Location: [facility] CATH;  Service: Cardiology   ??? PR RIGHT HEART CATH O2 SATURATION & CARDIAC OUTPUT N/A 03/19/2020    Procedure: Right Heart Catheterization W Biopsy;  Surgeon: Liliane Shi, MD;  Location: [facility] CATH;  Service: Cardiology   ??? PR RIGHT HEART CATH O2 SATURATION & CARDIAC OUTPUT N/A 03/26/2020    Procedure: Right Heart Catheterization W Biopsy;  Surgeon: Tiney Rouge, MD;  Location: [facility] CATH;  Service: Cardiology   ??? PR RIGHT HEART CATH O2 SATURATION & CARDIAC OUTPUT N/A 04/07/2020    Procedure: Right Heart Catheterization W Biopsy;  Surgeon: Carin Hock, MD;  Location: [facility];  Service: Cardiology   ??? PR RIGHT HEART CATH O2 SATURATION & CARDIAC OUTPUT N/A 04/21/2020    Procedure: Right Heart Catheterization W Biopsy;  Surgeon: Carin Hock, MD;  Location: [facility] CATH;  Service: Cardiology   ??? PR RIGHT HEART CATH O2 SATURATION & CARDIAC OUTPUT N/A 05/13/2020    Procedure: Right Heart Catheterization W Biopsy;  Surgeon: Carin Hock, MD;  Location: [facility] CATH;  Service: Cardiology   ??? PR RIGHT HEART CATH O2 SATURATION & CARDIAC OUTPUT N/A 06/09/2020    Procedure:  Right Heart Catheterization W Biopsy;  Surgeon: Carin Hock, MD;  Location: [facility] CATH;  Service: Cardiology   ??? PR RIGHT HEART CATH O2 SATURATION & CARDIAC OUTPUT N/A 07/07/2020    Procedure: Right Heart Catheterization W Biopsy;  Surgeon: Carin Hock, MD;  Location: [facility] CATH;  Service: Cardiology   ??? PR RIGHT HEART CATH O2 SATURATION & CARDIAC OUTPUT N/A 08/04/2020    Procedure: Right Heart Catheterization W Biopsy;  Surgeon: Carin Hock, MD;  Location: [facility] CATH;  Service: Cardiology   ??? PR RIGHT HEART CATH O2 SATURATION & CARDIAC OUTPUT N/A 09/01/2020    Procedure: Right Heart Catheterization W Biopsy;  Surgeon: Carin Hock, MD;  Location: [facility] CATH;  Service: Cardiology   ??? PR RIGHT HEART CATH O2 SATURATION & CARDIAC OUTPUT N/A 10/27/2020    Procedure: Right Heart Catheterization W Biopsy;  Surgeon: Carin Hock, MD;  Location: [facility] CATH;  Service: Cardiology   ??? PR SB SCOPE,PLACE PERCUT JEJUN TUBE N/A 05/11/2020    Procedure: SM INTEST ENDO NOT ILEUM; W/PLCMT JEJUNOST TUBE;  Surgeon: Vonda Antigua, MD;  [facility]: [facility];  Service: Gastroenterology   ??? PR SB SCOPE,PLACE TRANSENDOSCOPIC STENT N/A 05/11/2020    Procedure: SMALL INTEST ENDOSCOPY, NOT INCLUDING ILEUM, W/TRANSENDOSCOPIC STENT PLACEMENT;  Surgeon: Vonda Antigua, MD;  [facility]: [facility];  Service: Gastroenterology   ??? PR TRACHEOSTOMY, PLANNED Midline 03/11/2020    Procedure: TRACHEOSTOMY PLANNED (SEPART PROC);  Surgeon: Evert Kohl, MD;  Location: MAIN OR [facility];  Service: Thoracic   ??? PR TRANSPLANTATION OF HEART Midline 02/27/2020    Procedure: HEART TRANSPL W/WO RECIPIENT CARDIECTOMY;  Surgeon: Loletta Specter, MD;  Location: MAIN OR [facility];  Service: Cardiac Surgery   ??? PR UPPER GI ENDOSCOPY,BIOPSY N/A 12/21/2017    Procedure: ESOPHAGOGASTRODUODENOSCOPY with biopsy;  Surgeon: Olevia Bowens, MD;  Location: ENDO PROCEDURES [place];  Service: Gastroenterology   ??? PR UPPER GI ENDOSCOPY,BIOPSY N/A 05/07/2020    Procedure: UGI ENDOSCOPY; WITH BIOPSY, SINGLE OR MULTIPLE;  Surgeon: Vonda Antigua, MD;  Location: GI PROCEDURES [facility];  Service: Gastroenterology   ??? PR UPPER GI ENDOSCOPY,BIOPSY N/A 08/19/2020    Procedure: UGI ENDOSCOPY; WITH BIOPSY, SINGLE OR MULTIPLE;  Surgeon: Maris Berger, MD;  [facility]: GI PROCEDURES [facility];  Service: Gastroenterology   ??? PR UPPER GI ENDOSCOPY,DIAGNOSIS N/A 05/20/2020    Procedure: UGI ENDO, INCLUDE ESOPHAGUS, STOMACH, & DUODENUM &/OR JEJUNUM; DX W/WO COLLECTION SPECIMN, BY BRUSH OR WASH;  Surgeon: Bronson Curb, MD;  Location: GI PROCEDURES [facility];  Service: Gastroenterology   ??? PR VALVULOPLAS TRICUS W RING INSERT Midline 06/04/2018    Procedure: Valvuloplasty Tricuspid Valv; W/Ring Insrt;  Surgeon: Lennie Odor, MD;  Location: MAIN OR [facility];  Service: Cardiac Surgery       Allergies:   Scopolamine and Ativan [lorazepam]    Medications:  Current Outpatient Medications on File Prior to Visit   Medication Sig   ??? aspirin (ECOTRIN) 81 MG tablet Take 1 tablet (81 mg total) by mouth daily.   ??? atorvastatin (LIPITOR) 20 MG tablet Take 1 tablet (20 mg total) by mouth daily.   ??? azithromycin (ZITHROMAX) 500 MG tablet Take 1 tablet (500 mg total) by mouth daily.   ??? calcium carbonate (OS-CAL) 1,500 mg (600 mg elem calcium) tablet Take 1 tablet (600 mg of elem calcium total) by mouth Two (2) times a day.   ??? diltiazem (CARDIZEM CD) 120 MG 24 hr capsule Take 1 capsule (  120 mg total) by mouth daily.   ??? midodrine (PROAMATINE) 5 MG tablet Take 1-2 tablet (5-10 mg total) by mouth daily as needed (For use on dialysis days).   ??? mirtazapine (REMERON) 45 MG tablet Take 1 tablet (45 mg total) by mouth nightly.   ??? mycophenolate (MYFORTIC) 180 MG EC tablet Take 1 tablet (180 mg total) by mouth two (2) times a day.   ??? omadacycline 150 mg Tab Take 2 tablets (300 mg total) by mouth daily.   ??? pantoprazole (PROTONIX) 40 MG tablet Take 1 tablet (40 mg total) by mouth daily.   ??? tacrolimus (PROGRAF) 1 MG capsule Take 1-5 mg capsule (5 mg) with 1-1 mg capsule (1 mg) BID, total dose 6 mg BID   ??? tacrolimus (PROGRAF) 5 MG capsule Take 1-5 mg capsule (5 mg) with 1-1 mg capsule (1 mg) BID, total dose 6 mg BID   ??? tedizolid 200 mg Tab Take 1 tablet (200 mg total) by mouth daily.   ??? torsemide (DEMADEX) 100 MG tablet Please finish 20mg  tablets as directed by prescriber THEN Take 1 tablet (100 mg) by mouth twice daily on non-HD days (Tuesday, Thursday, Saturday and Sunday).      Social History:   Currently living w/ his sister.     Family History:   Family History   Problem Relation Age of Onset   ??? Heart disease Father         CABG   ??? Heart disease Paternal Grandfather         MI age 83, COD   ??? Early death Paternal Grandfather    ??? Heart disease Cousin         Paternal side: CABG        Review of Systems:  The balance of 10/12 systems is negative with the exception of HPI.    Physical Exam:  VITAL SIGNS:   Vitals:    01/19/21 0938   BP: 128/82   Pulse: 113   Temp: 36.8 ??C (98.3 ??F)   SpO2: 98%      Wt Readings from Last 12 Encounters:   01/19/21 64.2 kg (141 lb 8 oz)   01/19/21 64.2 kg (141 lb 8 oz)   01/19/21 64.2 kg (141 lb 8 oz)   12/29/20 65 kg (143 lb 6.4 oz)   12/29/20 65 kg (143 lb 6.4 oz)   12/08/20 63 kg (139 lb)   12/08/20 63 kg (139 lb)   12/08/20 63 kg (139 lb)   12/02/20 61.9 kg (136 lb 7.4 oz)   09/09/20 62.9 kg (138 lb 9.6 oz)   09/01/20 63 kg (138 lb 12.8 oz)   09/01/20 63 kg (138 lb 12.8 oz)     Constitutional: NAD, chronically ill but pleasant. Making jokes  ENT: [facility]. Wearing mask  Neck: Supple without enlargements, no thyromegaly, bruit, no JVD/JVP upright. No cervical or supraclavicular lymphadenopathy. Trach site healed  Cardiovascular: Nondisplaced PMI, normal S1, S2, no MGR. Normal carotid pulses without bruits. Normal peripheral pulses.   Lungs: Clear to auscultation bilaterally, without wheezes/crackles/rhonchi. Lungs w/ dim bases bilaterally R>L.    Skin:  midsternal incision w/ inferior portion of scaly, dry skin unchanged.  Minimal irritation at his G-tube site.   Abdomen: hypoactive bowel sounds, abdomen soft, non-tender and not distended. Extremities: No bilateral cyanosis, clubbing, edema.  No rash, lesions or petiche.  Musculo Skeletal: No joint tenderness, deformity, effusions. Grossly intact  Psychiatry: Pleasant, making jokes  Neurological: Alert and oriented to  person, place, and time. Grossly intact.   Dermatology: approximately 2 cm diameter soft, fluctuant nodule noted on inferior edge of his left scapula, ? lipoma    Pertinent Laboratory Studies:     Appointment on 01/19/2021   Component Date Value Ref Range Status   ??? Sodium 01/19/2021 141  135 - 145 mmol/L Final   ??? Potassium 01/19/2021 4.4  3.4 - 4.8 mmol/L Final   ??? Chloride 01/19/2021 99  98 - 107 mmol/L Final   ??? CO2 01/19/2021 32.4 (A) 20.0 - 31.0 mmol/L Final   ??? Anion Gap 01/19/2021 10  5 - 14 mmol/L Final   ??? BUN 01/19/2021 46 (A) 9 - 23 mg/dL Final   ??? Creatinine 01/19/2021 4.26 (A) 0.60 - 1.10 mg/dL Final   ??? BUN/Creatinine Ratio 01/19/2021 11   Final   ??? EGFR CKD-EPI Non-African American,* 01/19/2021 14 (A) >=60 mL/min/1.51m2 Final   ??? EGFR CKD-EPI African American, Male 01/19/2021 16 (A) >=60 mL/min/1.3m2 Final   ??? Glucose 01/19/2021 91  70 - 179 mg/dL Final   ??? Calcium 16/07/9603 10.3  8.7 - 10.4 mg/dL Final   ??? Magnesium 54/06/8118 2.3  1.6 - 2.6 mg/dL Final   ??? Tacrolimus, Timed 01/19/2021 7.5  ng/mL Final   ??? Fibrinogen 01/19/2021 597 (A) 177 - 386 mg/dL Final   ??? WBC 14/78/2956 6.3  3.6 - 11.2 10*9/L Final   ??? RBC 01/19/2021 3.72 (A) 4.26 - 5.60 10*12/L Final   ??? HGB 01/19/2021 11.7 (A) 12.9 - 16.5 g/dL Final   ??? HCT 21/30/8657 35.6 (A) 39.0 - 48.0 % Final   ??? MCV 01/19/2021 95.6  77.6 - 95.7 fL Final   ??? MCH 01/19/2021 31.5  25.9 - 32.4 pg Final   ??? MCHC 01/19/2021 32.9  32.0 - 36.0 g/dL Final   ??? RDW 84/69/6295 15.5 (A) 12.2 - 15.2 % Final   ??? MPV 01/19/2021 7.7  6.8 - 10.7 fL Final   ??? Platelet 01/19/2021 151  150 - 450 10*9/L Final   ??? nRBC 01/19/2021 0  <=4 /100 WBCs Final   ??? Neutrophils % 01/19/2021 72.7  % Final   ??? Lymphocytes % 01/19/2021 8.2  % Final   ??? Monocytes % 01/19/2021 9.5  % Final   ??? Eosinophils % 01/19/2021 9.1  % Final   ??? Basophils % 01/19/2021 0.5  % Final   ??? Absolute Neutrophils 01/19/2021 4.6  1.8 - 7.8 10*9/L Final   ??? Absolute Lymphocytes 01/19/2021 0.5 (A) 1.1 - 3.6 10*9/L Final   ??? Absolute Monocytes 01/19/2021 0.6  0.3 - 0.8 10*9/L Final   ??? Absolute Eosinophils 01/19/2021 0.6 (A) 0.0 - 0.5 10*9/L Final   ??? Absolute Basophils 01/19/2021 0.0  0.0 - 0.1 10*9/L Final

## 2021-01-18 NOTE — Unmapped (Signed)
Received communication from [place] with his weekly numbers (this week and last week below). Victor Dyer's weight is remaining stable at this time. And he continues with 2 cartons per night of Novasource Renal (950 kcals and 44 g pro). He is supposed to stop antibiotic therapy this week so may see additional improvement after that. If no improvement in 2-3 weeks then will likely need to increase nocturnal TF again.            Averages run Sunday thru Saturday      Calories Protein Weight Weekly Avg Calories Weekly Avg Protein Weekly Avg Weight Tube feed cartons/week   Sun 01/10/21 2,395 94 141.8 2,285 96 141.4 14   Mon 01/11/21 2,081 95 142.6       Tue 01/12/21 2,429 93 141.8       Wed 01/13/21 2,064 109 143.4       Thu 01/14/21 2,375 88 139.6       Fri 01/15/21 2,267 94 141.2       Sat 01/16/21 2,383 97 139.6              Averages run Sunday thru Saturday     Weekly Avg Calories Weekly Avg Protein Weekly Avg Weight Tube feed cartons/week          Feb 13-19 2,264 97 137.3 21   Feb 20-26 2,224 93 138.6 16   Feb 27-Mar 5 1,970 91 140.6 15   Mar 6-Mar 12 2,174 92 142.4 14   Mar 13-Mar 19 2,095 93 141.2 14   Mar 20-Mar 26 2,285 96 141.4 14       Calories Protein Weight Weekly Avg Calories Weekly Avg Protein Weekly Avg Weight Tube feed cartons/week   Sun 01/03/21 1,712 83 141.4 2,095 93 141.2 14   Mon 01/04/21 2,218 99 142.6       Tue 01/05/21 2,156 77 139.8       Wed 01/06/21 1,854 85 141.2       Thu 01/07/21 2,087 97 141.2       Fri 01/08/21 2,227 109 142.4       Sat 01/09/21 2,410 100 139.8            Averages run Sunday thru Saturday     Weekly Avg Calories Weekly Avg Protein Weekly Avg Weight Tube feed cartons/week          Feb 13-19 2,264 97 137.3 21   Feb 20-26 2,224 93 138.6 16   Feb 27-Mar 5 1,970 91 140.6 15   Mar 6-Mar 12 2,174 92 142.4 14   Mar 13-Mar 19 2,095 93 141.2 14

## 2021-01-19 ENCOUNTER — Ambulatory Visit: Admit: 2021-01-19 | Discharge: 2021-01-20 | Payer: PRIVATE HEALTH INSURANCE

## 2021-01-19 ENCOUNTER — Ambulatory Visit
Admit: 2021-01-19 | Discharge: 2021-01-20 | Payer: PRIVATE HEALTH INSURANCE | Attending: Adult Health | Primary: Adult Health

## 2021-01-19 ENCOUNTER — Ambulatory Visit
Admit: 2021-01-19 | Discharge: 2021-01-20 | Payer: PRIVATE HEALTH INSURANCE | Attending: Infectious Disease | Primary: Infectious Disease

## 2021-01-19 DIAGNOSIS — Z941 Heart transplant status: Principal | ICD-10-CM

## 2021-01-19 DIAGNOSIS — Z48298 Encounter for aftercare following other organ transplant: Principal | ICD-10-CM

## 2021-01-19 LAB — BASIC METABOLIC PANEL
ANION GAP: 10 mmol/L (ref 5–14)
BLOOD UREA NITROGEN: 46 mg/dL — ABNORMAL HIGH (ref 9–23)
BUN / CREAT RATIO: 11
CALCIUM: 10.3 mg/dL (ref 8.7–10.4)
CHLORIDE: 99 mmol/L (ref 98–107)
CO2: 32.4 mmol/L — ABNORMAL HIGH (ref 20.0–31.0)
CREATININE: 4.26 mg/dL — ABNORMAL HIGH
EGFR CKD-EPI AA MALE: 16 mL/min/{1.73_m2} — ABNORMAL LOW (ref >=60)
EGFR CKD-EPI NON-AA MALE: 14 mL/min/{1.73_m2} — ABNORMAL LOW (ref >=60)
GLUCOSE RANDOM: 91 mg/dL (ref 70–179)
POTASSIUM: 4.4 mmol/L (ref 3.4–4.8)
SODIUM: 141 mmol/L (ref 135–145)

## 2021-01-19 LAB — CBC W/ AUTO DIFF
BASOPHILS ABSOLUTE COUNT: 0 10*9/L (ref 0.0–0.1)
BASOPHILS RELATIVE PERCENT: 0.5 %
EOSINOPHILS ABSOLUTE COUNT: 0.6 10*9/L — ABNORMAL HIGH (ref 0.0–0.5)
EOSINOPHILS RELATIVE PERCENT: 9.1 %
HEMATOCRIT: 35.6 % — ABNORMAL LOW (ref 39.0–48.0)
HEMOGLOBIN: 11.7 g/dL — ABNORMAL LOW (ref 12.9–16.5)
LYMPHOCYTES ABSOLUTE COUNT: 0.5 10*9/L — ABNORMAL LOW (ref 1.1–3.6)
LYMPHOCYTES RELATIVE PERCENT: 8.2 %
MEAN CORPUSCULAR HEMOGLOBIN CONC: 32.9 g/dL (ref 32.0–36.0)
MEAN CORPUSCULAR HEMOGLOBIN: 31.5 pg (ref 25.9–32.4)
MEAN CORPUSCULAR VOLUME: 95.6 fL (ref 77.6–95.7)
MEAN PLATELET VOLUME: 7.7 fL (ref 6.8–10.7)
MONOCYTES ABSOLUTE COUNT: 0.6 10*9/L (ref 0.3–0.8)
MONOCYTES RELATIVE PERCENT: 9.5 %
NEUTROPHILS ABSOLUTE COUNT: 4.6 10*9/L (ref 1.8–7.8)
NEUTROPHILS RELATIVE PERCENT: 72.7 %
NUCLEATED RED BLOOD CELLS: 0 /100{WBCs} (ref <=4)
PLATELET COUNT: 151 10*9/L (ref 150–450)
RED BLOOD CELL COUNT: 3.72 10*12/L — ABNORMAL LOW (ref 4.26–5.60)
RED CELL DISTRIBUTION WIDTH: 15.5 % — ABNORMAL HIGH (ref 12.2–15.2)
WBC ADJUSTED: 6.3 10*9/L (ref 3.6–11.2)

## 2021-01-19 LAB — FIBRINOGEN: FIBRINOGEN LEVEL: 597 mg/dL — ABNORMAL HIGH (ref 177–386)

## 2021-01-19 LAB — MAGNESIUM: MAGNESIUM: 2.3 mg/dL (ref 1.6–2.6)

## 2021-01-19 LAB — TACROLIMUS LEVEL: TACROLIMUS BLOOD: 7.5 ng/mL

## 2021-01-19 NOTE — Unmapped (Signed)
[facility] PHARMACY NOTE  01/19/2021   Victor Dyer  [id number]    Medication Changes Today:   1. Stop MAC therapy per ID  2. HOLD Diltiazem night before HD to help with Bps in HD    Education/Adherence Tools Provided Today:  1.provided updated medication list    Follow Up Items:  1. Need for continued PPI    Next visit with pharmacy in 1-2 weeks  ____________________________________________________________________    Victor Dyer is a 65 y.o. male s/p heart transplant on 02/27/2020 (Heart) 2/2 ischemic cardiomyopathy . Patient was maintained on LVAD support prior to transplantation    Other PMH significant for HFrEF, CAD, ESRD (on iHD MWF), STEMI h/x (2018) malnutrition, anemia, low back pain    Seen by pharmacy today for: medication management, pill box assessment and adherence education    CC: Patient has no complaints today    Vitals:    01/19/21 0956   BP: 128/82   Pulse: 113   Temp: 36.8 ??C (98.2 ??F)   SpO2: 98%     Allergies   Allergen Reactions   ??? Scopolamine Other (See Comments) and Hallucinations     Induces severe delirium    ??? Ativan [Lorazepam] Other (See Comments) and Hallucinations     Severe somnolence/delirium      All medications reviewed and updated. Medication list includes revisions made during today???s encounter    Current Outpatient Medications   Medication Instructions   ??? aspirin (ECOTRIN) 81 mg, Oral, Daily (standard)   ??? atorvastatin (LIPITOR) 20 mg, Oral, Daily (standard)   ??? calcium carbonate (OS-Dyer) 1,500 mg (600 mg elem calcium) tablet 600 mg of elem calcium, Oral, 2 times a day (standard)   ??? cholecalciferol (vitamin D3-125 mcg (5,000 unit)) 125 mcg, Oral, Daily (standard)   ??? dilTIAZem (CARDIZEM CD) 120 mg, Oral, Daily (standard)   ??? midodrine (PROAMATINE) 5 mg, Oral, Daily PRN   ??? mirtazapine (REMERON) 45 mg, Oral, Nightly   ??? mycophenolate (MYFORTIC) 180 mg, Oral, 2 times a day   ??? pantoprazole (PROTONIX) 40 mg, Oral, Daily (standard)   ??? tacrolimus (PROGRAF) 1 MG capsule Take 1-5 mg capsule (5 mg) with 1-1 mg capsule (1 mg) BID, total dose 6 mg BID   ??? tacrolimus (PROGRAF) 5 MG capsule Take 1-5 mg capsule (5 mg) with 1-1 mg capsule (1 mg) BID, total dose 6 mg BID   ??? torsemide (DEMADEX) 100 MG tablet Please finish 20mg  tablets as directed by prescriber THEN Take 1 tablet (100 mg) by mouth twice daily on non-HD days (Tuesday, Thursday, Saturday and Sunday).     Induction agent: steroids only    CURRENT IMMUNOSUPPRESSION:  ?? Tacrolimus 6 mg QAM and 6 mg QPM (goal level 6-10 ng/mL)   ?? Mycophenolate sodium (Myfortic) 180 mg BID  ?? Prednisone stopped during admission on 08/05/20     Patient is tolerating immunosuppression well    IMMUNOSUPPRESSION DRUG LEVELS:  Lab Results   Component Value Date    Tacrolimus, Trough 9.4 01/04/2021    Tacrolimus, Trough 10.1 12/29/2020    Tacrolimus, Trough 9.2 12/08/2020    Tacrolimus, Trough 5.3 09/24/2020    Tacrolimus, Timed 7.5 01/19/2021    Tacrolimus, Timed 7.5 12/21/2020    Tacrolimus, Timed 12.1 12/02/2020     Graft Function: Stable   DSA: NTD (03/30/20, 06/27/20, 10/08/20)   Biopsies to Date:08/04/20, 07/07/20   WBC/ANC: WNL     Plan: Will maintain current immunosuppression. Will address tacrolimus  dosing pending today's level.      ID Prophylaxis:   CMV Status: D+/ R-, high risk. CMV prophylaxis with Valganciclovir 450 mg every Tuesday and Friday  x 6 months per protocol - therapy complete  PCP/Toxo: Prophylaxis with atovaquone 1500 mg daily x 6 months - therapy complete    ID Treatment:  +MAC (see ID note for full detials)  Current meds: omadacycline 150mg  daily, azithromycin 500mg  daily, tedezolid 200mg  daily  Plan: Per ID, STOP MAC therapy    CAV Prophylaxis:   Aspirin: asa 81 mg daily   Statin: atorvastatin 20 mg daily   Misc: diltiazem 120 mg PO daily  Plan: Continue aspirin and atorvastatin at doses above.    Blood Pressure:   Goal < 140/90. Encounter vitals reported above  Home BP log: 110s-140s/60s-80s. Systolic in the 140s considered an isolated occurrence.   Current Meds: Midodrine 5 mg daily on dialysis days (MWF), torsemide 100 mg PO BID on non-dialysis days; has received two sessions of HD outpatient since being discharged   Plan: BP within goal save during HD. Will hold diltiazem PM before HD sessions     Anemia:  H/H:   Lab Results   Component Value Date    HGB 11.7 (L) 01/19/2021     Lab Results   Component Value Date    HCT 35.6 (L) 01/19/2021     Iron panel:  Lab Results   Component Value Date    IRON 37 (L) 10/18/2020    TIBC 129 (L) 10/18/2020    FERRITIN 3,297.7 (H) 09/25/2020     Lab Results   Component Value Date    Iron Saturation (%) 29 10/18/2020     Prior anemia therapy: N/A  Plan: H/H low but stable. Continue to monitor.     DM:     Lab Results   Component Value Date    A1C <4.0 (L) 09/01/2020     Goal A1c < 7  Currently NOT on DM medications   Home BG Log: N/A   Diet: Appetite is not overly strong but has improved  Exercise: Not addressed   Hypoglycemia: unable to access since patient is no longer checking at home; no reports of hypoglycemic symptoms  Plan:  Continue to monitor    GI/Bowel Movements:  Stable; occasional loose stool   Patient has PEG tube; NP assessed entry site. Patient reports that he recalls taking PPI prior to LVAD.  Current Meds: Miralax 17g daily PRN, bisacodyl suppository 10 mg PRN, pantoprazole 40 mg daily, simethicone 160 mg TID PRN   Plan: Continue to monitor.     Pain:  Patient currently without pain complaints   Current Meds: None   Plan: Continue to monitor     Bone Health:   Patient currently off steroid therapy  Vitamin D Level: last level is 49.2 (10/01/20). Goal > 30  Last DEXA results: Lumbar spine (normal bone density); left proximal femur (mildly low bone density)  Current Medications: calcium carbonate 600 mg (elemental calcium) BID and vitamin D3 cholecalciferol 5,000 units daily  Plan: Continue to monitor     Nutrition Status:  Patient increasing PO intake following prolonged hospitalization   Current Meds: Mirtazapine 45 mg QPM  Plan: Continue to monitor     Sleep:  Patient currently reports no sleep issues   Current Meds: Trazodone 25 mg QPM PRN (has not needed), Mirtazapine 45 mg QPM   Plan: Continue to monitor     Men's Health:  Duane Lope  Gobert is a 65 y.o. male. Patient reports no men's/women's health issues  Plan: Continue to monitor     Adherence: Patient has good understanding of medications.  Patient  does not fill their own pill box on a regular basis at home (patient's wife fills pill box)   Patient brought medication card:yes   Pill box:was correct  Patient requested refills for the following meds: N/A  Corrections needed in Epic medication list: none   Plan: Provided moderate adherence counseling/intervention    Spent approximately 20 minutes on educating this patient and greater than 50% was spent in direct face to face counseling regarding post transplant medication education. Questions and concerns were address to patient's satisfaction.    Patient was reviewed with Nyra Market, ARNP who was agreement with the stated plan     During this visit, the following was completed:   -Labs ordered and evaluated  -complex treatment plan >1 DS   -Patient education was completed for 11-24 minutes     All questions/concerns were addressed to the patient's satisfaction.    __________________________________________________    PATIENT EVALUATED BY:  Francesa Eugenio TEETER Nema Oatley, PHARMD, BCPS, CPP  [employer] [facility] PHARMACIST PRACTITIONER  PAGER [phone]

## 2021-01-19 NOTE — Unmapped (Signed)
See Pharmacy encounter in clinic this day for additional documentation.

## 2021-01-19 NOTE — Unmapped (Signed)
IMMUNOCOMPROMISED HOST INFECTIOUS DISEASE PROGRESS NOTE    Assessment/Plan:     [name] is a 65 y.o. male who presents for f/u of M abscessus bacteremia.    ID Problem List:  # Chronic systolic heart failure??2/2 ICM??with h/o LV thrombus??s/p??heart??transplant??on 02/29/20??with LVAD and ICD explant 02/27/20  - Serologies: CMV D+/R- (high-risk),??EBV D+/R-, Toxo D-/R-  - Both donor and recipient CMV IgG reported as both negative and positive on different occasions - safest approach is to treat as CMV high-risk D+/R-  -??Post-op course complicated by coagulopathy s/p 8 units pRBC, 4 FFP, 2 plt, 1 cryo  - 5/8 OR: chest washout and closure --> left IMA branch bleed, suture ligated  - 10/12 endomyocardial biopsy results mild ACR, ISHLT grade 1R/1A, prednisone discontinued  - Induction:??methylpred  - Immunosuppression: tacrolimus, mycophenolate (CellCept)  ??  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 abscessus bacteremia likely HD catheter source 09/19/20, last line placed on 12/23 after clearance of bacetermia- Stable on combination antibiotics with high toxicity potential.   -11/27 blood cx + 2/2 M. Abscessus S Amikacin, I Cefoxitin, R Cipro, S Clarithro, R Doxy, I Imipenem, S Linezolid, R Minocycline, R Moxifloxacin, R TMP-SMX, Tigecycline MIC 0.12 (no interpretation)   -11/28 CT chest --??Increase in size of moderate right pleural effusion with new, near-complete collapse of the right middle and lower lobes.Unchanged loculated complex fluid collection in left cardiophrenic recess and stable left basilar pleural effusion with adjacent atelectasis. IP did not recommend drainage of the right pleural effusion.   -12/2: AFB cx negative.   -09/28/20: bronch mild subglottic stenosis from prior tracheostomy site  -09/28/20: Exchange of permacath for trialysis line through existing track??by VIR. AFB blood cx + M. abscessus 1/2 + after cath exchange  -09/30/20: CT chest slight decrease in size of moderate right pleural effusion with improved aeration of the right lower lobe, Similar loculated left pleural effusion with left lower lobe atelectasis. Decreased size of the loculated complex fluid collection in the left cardiophrenic recess.  - 12/17 Progressive thrombocytopenia concerning for linezolid toxicity, switched to ceftaroline  - 12/18 Repeat AFB cx negative to date   - 12/23 Exchange of permacath through existing track by VIR   -12/27 AFB blood cx negative to date.   - 2/10 LIJ HD cath exchanged by IR nontunneled to tunneled over wire.   - Rx: 12/3 amikacin, linezolid, azithro, imipenem/cilastatin-->12/10??azithro, linezolid, eravacycline, imipenem/cilastatin --> 12/17 azithro, imipenem-cilastatin, ceftaroline, eravacycline->  - Final tx: omada 1/18 started, Tedizolid 1/15, azithro continued until 3/29  ??    Prior infections:  #??HAP??with Serratia??(R to cefazolin, S to ceftriaxone, ceftazidime, cipro, levo, piptazo) 5/14 and B cepacia??(S to ceftazidime, meropenem, minocycline, TMP-SMX) 03/10/2020??  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 and VAP due to PsA and Burkholderia (04/12/20) and subsequent development of cardiophrenic fluid collection of unclear significance (06/03/20)  - 03/28/20??BAL:????Burkholderia cepacia complex??(S: minocycline; I: ceftaz, meropenem; R: bactrim; ceftazidime-avibactam MIC 8)??(cefiderocol, levoflox, and recarbio sens unable to be obtained)  - 6/14  Blood Cx Burkholderia cepacia complex??x??R: ceftaz, mero; S: mino, bactrim   - 6/20 trach aspirate PsA (R: cefepime, ceftaz, mero, pip-tazo; S: cefiderocol, cipro, gent, levo, tobra) and Burkholderia??(R: ceftaz, I: mero; S: mino, bactrim)  - 7/13 trach aspirate with 2+ PsA??(R: cefepime, ceftaz, zosyn, mero; I: cipro, S: tobra, gent)  - 10/25 CT chest??decreased??L??cardiophrenic recess fluid collection 5.3 x 3.3;??increase in moderate right pleural effusion and grossly stable small left loculated pleural effusion  - Completed course of 3 month course of cefiderocol and minocycline Sept 2021  # PEG dislodged s/p GJ revision 03/22/20 with subsequent wound dehiscence (03/27/20) s/p??ex lap with wedge resection of stomach, PEG J (05/11/20) w/ wound vac (05/14/20)      Recommendations:  - Stop antibiotics today.  - Follow up PRN - discussed with patient to watch for unexplained fevers, rigors, tenderness/swelling/erythema of HD line site    Discussed and seen with Dr. Reynold Bowen.    Victor Heir, MD  Infectious Diseases Fellow, PGY [address] of [place], [place]  01/25/2021 9:29 AM          Subjective     Interval History:      Feels overall well. Continues on azithro, tedizolid, omadacycline; has had no issues taking antibiotics, has not missed doses. No fevers, chills, SOB, pain at HD line site, n/v, abdominal pain, rashes, joint pains. Does note mild diarrhea 2x daily that began when he started antibiotics in December 2021.     Medications:  Antimicrobials: azithro, tedizolid, omadacycline  Prior/Current immunomodulators: mycophenolate, tacrolimus  Other medications reviewed.    Objective     Vital signs:  There were no vitals taken for this visit.    Physical Exam:  GEN:  looks well, no apparent distress  EYES: sclerae anicteric and non injected  NGE:XBMWUXLKG good, no thrush, leukoplakia or oral lesions and MMM  CV:RRR, no abnormal heart sounds noted and no peripheral edema  PULM:normal work of breathing at rest, CTAB anteriorly and CTAB posteriorly  MW:NUUV, NTND  SKIN:no petechiae, ecchymoses or obvious rashes on clothed exam  MSK:no swollen joints  NEURO:no tremor noted, facial expression symmetric and moves extremities equally  PSYCH:attentive, appropriate affect, good eye contact, fluent speech    Labs:  Lab Results   Component Value Date    WBC 6.3 01/19/2021    WBC 6.9 01/04/2021    WBC 5.8 12/29/2020    WBC 7.0 12/21/2020    WBC 6.8 12/08/2020    WBC 2.4 (L) 09/07/2020    WBC 6.9 08/14/2019    WBC 10.3 07/09/2018    HGB 11.7 (L) 01/19/2021    Hemoglobin 7.7 (L) 05/06/2020    Hemoglobin, POC 7.8 (L) 10/27/2020    HCT 35.6 (L) 01/19/2021    HCT 37.8 08/14/2019    Platelet 151 01/19/2021    Platelet 212 08/14/2019    Absolute Neutrophils 4.6 01/19/2021    Absolute Neutrophils 4.8 08/14/2019    Absolute Lymphocytes 0.5 (L) 01/19/2021    Absolute Lymphocytes 1.3 08/14/2019    Absolute Eosinophils 0.6 (H) 01/19/2021    Absolute Eosinophils 0.1 08/14/2019    Sodium 138 01/04/2021    Sodium Whole Blood 138 05/06/2020    Potassium 4.8 01/04/2021    Potassium, Bld 4.2 05/06/2020    BUN 83 (H) 01/04/2021    BUN 19 08/14/2019    Creatinine 7.17 (H) 01/04/2021    Creatinine 3.56 (H) 12/29/2020    Creatinine 6.94 (H) 12/21/2020    Creatinine 1.44 (H) 08/14/2019  Creatinine 1.26 07/09/2018    Glucose 100 (H) 01/04/2021    Magnesium 2.2 01/04/2021    Albumin 3.2 (L) 12/29/2020    Total Bilirubin 0.2 (L) 12/29/2020    Total Bilirubin 0.3 08/14/2019    AST 33 12/29/2020    AST 24 08/14/2019    ALT 33 12/29/2020    ALT 26 08/14/2019    Alkaline Phosphatase 178 (H) 12/29/2020    Alkaline Phosphatase 128 (H) 08/14/2019    INR, POC 1.10 04/23/2019    INR 0.99 09/30/2020       Microbiology:  Past cultures were reviewed in Epic.  AFB cultures remain negative after 12/6    Imaging:  None new.

## 2021-01-19 NOTE — Unmapped (Signed)
Glad you can come back off the antibiotics    DON'T TAKE diltiazem on Sunday, Tuesday or Thursday nights. This should help with the lower blood pressures at dialysis    Continue to take the midodrine before dialysis but you should hopefully see you don't need as much midodrine    We'll have you come back for your left/right heart catheterization and biopsy in the next few weeks    We'll plan follow up after that    Please see dermatology to look at the knot behind your shoulder; I think it's a lipoma    Patient Education        Lipoma: Care Instructions  Your Care Instructions  A lipoma is a growth of fat just below the skin. It may feel soft and rubbery. Lipomas can occur anywhere on the body. But they are most common on the torso, neck, upper thighs, upper arms, and armpits. A lipoma does not turn into cancer.  Lipomas usually are not treated, because most of them don't hurt or cause problems. But your doctor may remove a lipoma if it is painful, gets infected, or bothers you.  Follow-up care is a key part of your treatment and safety. Be sure to make and go to all appointments, and call your doctor if you are having problems. It's also a good idea to know your test results and keep a list of the medicines you take.  How can you care for yourself at home?  ?? A lipoma usually needs no care at home unless your doctor made a cut (incision) to remove it.  ?? If your doctor told you how to care for your incision, follow your doctor's instructions. If you did not get instructions, follow this general advice:  ? Wash around the incision with clean water 2 times a day. Don't use hydrogen peroxide or alcohol. These can slow healing.  ? You may cover the incision with a thin layer of petroleum jelly, such as Vaseline, and a nonstick bandage.  ? Apply more petroleum jelly and replace the bandage as needed.  When should you call for help?   Call your doctor now or seek immediate medical care if:  ?? ?? You have signs of infection, such as:  ? Increased pain, swelling, warmth, or redness.  ? Red streaks leading from the lipoma.  ? Pus draining from the lipoma.  ? A fever.   Watch closely for changes in your health, and be sure to contact your doctor if:  ?? ?? The lipoma is growing or changing.   ?? ?? You do not get better as expected.   Where can you learn more?  Go to [facility] at [email or web address] forces [email or web address]/[email or web address] in the Menu. Enter [phone] in the search box to learn more about Lipoma: Care Instructions.  Current as of: September 07, 2020??????????????????????????????Content Version: 13.2  ?? 2006-2022 [employer].   Care instructions adapted under license by [facility] ([facility]). If you have questions about a medical condition or this instruction, always ask your healthcare professional. [employer] disclaims any warranty or liability for your use of this information.

## 2021-01-20 NOTE — Unmapped (Signed)
I was the supervising physician in the delivery of the service. Carin Hock, MD

## 2021-01-21 NOTE — Unmapped (Signed)
Discussed recent labs with Nyra Market, NP and Edgar Frisk, PharmD.  Plan is to Make No Changes with repeat labs in 1 Week.    Secure MyChart message sent to the patient.      Lab Results   Component Value Date    TACROLIMUS 7.5 01/19/2021     Goal: Tac: 6-10  Current Dose: Tac 6mg  BID    Lab Results   Component Value Date    BUN 46 (H) 01/19/2021    CREATININE 4.26 (H) 01/19/2021    K 4.4 01/19/2021    GLU 91 01/19/2021    MG 2.3 01/19/2021     Lab Results   Component Value Date    WBC 6.3 01/19/2021    HGB 11.7 (L) 01/19/2021    HCT 35.6 (L) 01/19/2021    PLT 151 01/19/2021    NEUTROABS 4.6 01/19/2021    EOSABS 0.6 (H) 01/19/2021

## 2021-01-22 DIAGNOSIS — K21 Gastroesophageal reflux disease with esophagitis without hemorrhage: Principal | ICD-10-CM

## 2021-01-22 MED ORDER — PANTOPRAZOLE 40 MG TABLET,DELAYED RELEASE
ORAL_TABLET | Freq: Every day | ORAL | 11 refills | 30.00000 days | Status: CP
Start: 2021-01-22 — End: ?

## 2021-01-22 MED ORDER — ATORVASTATIN 20 MG TABLET
ORAL_TABLET | Freq: Every day | ORAL | 11 refills | 30.00000 days | Status: CP
Start: 2021-01-22 — End: ?

## 2021-01-22 MED FILL — MIRTAZAPINE 45 MG TABLET: ORAL | 30 days supply | Qty: 30 | Fill #1

## 2021-01-22 MED FILL — TORSEMIDE 100 MG TABLET: ORAL | 26 days supply | Qty: 30 | Fill #1

## 2021-01-22 MED FILL — MIDODRINE 5 MG TABLET: ORAL | 30 days supply | Qty: 30 | Fill #0

## 2021-01-22 MED FILL — DILTIAZEM CD 120 MG CAPSULE,EXTENDED RELEASE 24 HR: ORAL | 30 days supply | Qty: 30 | Fill #0

## 2021-01-25 ENCOUNTER — Other Ambulatory Visit (HOSPITAL_COMMUNITY)
Admission: RE | Admit: 2021-01-25 | Discharge: 2021-01-25 | Disposition: A | Discharge status: Home / Self Care | Payer: 59 | Source: Ambulatory Visit | Attending: Cardiology | Admitting: Cardiology

## 2021-01-25 DIAGNOSIS — Z941 Heart transplant status: Secondary | ICD-10-CM | POA: Insufficient documentation

## 2021-01-25 DIAGNOSIS — Z48298 Encounter for aftercare following other organ transplant: Secondary | ICD-10-CM | POA: Diagnosis not present

## 2021-01-25 DIAGNOSIS — Z79899 Other long term (current) drug therapy: Secondary | ICD-10-CM | POA: Diagnosis not present

## 2021-01-25 DIAGNOSIS — R7989 Other specified abnormal findings of blood chemistry: Secondary | ICD-10-CM | POA: Diagnosis not present

## 2021-01-25 LAB — CBC W/ DIFFERENTIAL
BASOPHILS ABSOLUTE COUNT: 0 10*3/uL (ref 0.0–0.1)
BASOPHILS RELATIVE PERCENT: 0 %
EOSINOPHILS ABSOLUTE COUNT: 0.8 10*3/uL — ABNORMAL HIGH (ref 0.0–0.5)
EOSINOPHILS RELATIVE PERCENT: 12 %
HEMATOCRIT: 36.4 % — ABNORMAL LOW (ref 39.0–52.0)
HEMOGLOBIN: 11.5 g/dL — ABNORMAL LOW (ref 13.0–17.0)
LYMPHOCYTES ABSOLUTE COUNT: 0.6 10*3/uL — AB (ref 0.7–4.0)
LYMPHOCYTES RELATIVE PERCENT: 9 10*3/uL
MEAN CORPUSCULAR HEMOGLOBIN CONC: 31.6 g/dL (ref 30.0–36.01)
MEAN CORPUSCULAR HEMOGLOBIN: 31.9 pg (ref 26.0–34.0)
MEAN CORPUSCULAR VOLUME: 100.8 fL — ABNORMAL HIGH (ref 80.0–100.0)
MONOCYTES ABSOLUTE COUNT: 0.6 10*3/uL (ref 0.1–1.0)
MONOCYTES RELATIVE PERCENT: 9 %
NEUTROPHILS RELATIVE PERCENT: 69 %
NUCLEATED RED BLOOD CELLS: 0 % (ref 0.0–0.2)
PLATELET COUNT: 146 10*3/uL — ABNORMAL LOW (ref 150–400)
RED BLOOD CELL COUNT: 3.61 MIL/uL — ABNORMAL LOW (ref 4.22–5.81)
RED CELL DISTRIBUTION WIDTH: 13.9 % (ref 11.5–15.5)
WHITE BLOOD CELL COUNT: 6.6 10*3/uL (ref 4.0–10.5)

## 2021-01-25 LAB — COMPREHENSIVE METABOLIC PANEL
ALBUMIN: 3.5 g/dL (ref 3.5–5.0)
ALKALINE PHOSPHATASE: 133 U/L — ABNORMAL HIGH (ref 38–126)
ALT (SGPT): 33 U/L (ref 0–44)
ALT: 33 U/L (ref 0–44)
ANION GAP: 14 (ref 5–15)
AST (SGOT): 30 U/L (ref 15–41)
AST: 30 U/L (ref 15–41)
Albumin: 3.5 g/dL (ref 3.5–5.0)
Alkaline Phosphatase: 133 U/L — ABNORMAL HIGH (ref 38–126)
Anion gap: 14 (ref 5–15)
BILIRUBIN TOTAL: 0.5 mg/dL (ref 0.3–1.2)
BLOOD UREA NITROGEN: 108 mg/dL — ABNORMAL HIGH (ref 8–23)
BUN: 108 mg/dL — ABNORMAL HIGH (ref 8–23)
CALCIUM: 9.8 mg/dL (ref 8.9–10.3)
CHLORIDE: 94 mmol/L — ABNORMAL LOW (ref 98–111)
CO2: 30 mmol/L (ref 22–32)
CO2: 30 mmol/L (ref 22–32)
CREATININE: 7.97 mg/dL — ABNORMAL HIGH (ref 0.61–1.24)
Calcium: 9.8 mg/dL (ref 8.9–10.3)
Chloride: 94 mmol/L — ABNORMAL LOW (ref 98–111)
Creatinine, Ser: 7.97 mg/dL — ABNORMAL HIGH (ref 0.61–1.24)
EGFR CKD-EPI NON-AA MALE: 7 mL/min — ABNORMAL LOW
GFR, Estimated: 7 mL/min — ABNORMAL LOW (ref >60)
GLUCOSE RANDOM: 99 mg/dL (ref 70–99)
Glucose, Bld: 99 mg/dL (ref 70–99)
POTASSIUM: 5.3 mmol/L — ABNORMAL HIGH (ref 3.5–5.1)
PROTEIN TOTAL: 6.9 g/dL (ref 6.5–8.1)
Potassium: 5.3 mmol/L — ABNORMAL HIGH (ref 3.5–5.1)
SODIUM: 138 MMOL/L (ref 135–145)
Sodium: 138 mmol/L (ref 135–145)
Total Bilirubin: 0.5 mg/dL (ref 0.3–1.2)
Total Protein: 6.9 g/dL (ref 6.5–8.1)

## 2021-01-25 LAB — FIBRINOGEN
FIBRINOGEN LEVEL: 472
Fibrinogen: 472 mg/dL (ref 210–475)

## 2021-01-25 LAB — MAGNESIUM
MAGNESIUM: 2.8 mg/dL — ABNORMAL HIGH (ref 1.7–2.41)
Magnesium: 2.8 mg/dL — ABNORMAL HIGH (ref 1.7–2.4)

## 2021-01-25 LAB — CBC WITH DIFFERENTIAL/PLATELET
Abs Immature Granulocytes: 0.05 10*3/uL (ref 0.00–0.07)
Basophils Absolute: 0 10*3/uL (ref 0.0–0.1)
Basophils Relative: 0 %
Eosinophils Absolute: 0.8 10*3/uL — ABNORMAL HIGH (ref 0.0–0.5)
Eosinophils Relative: 12 %
HCT: 36.4 % — ABNORMAL LOW (ref 39.0–52.0)
Hemoglobin: 11.5 g/dL — ABNORMAL LOW (ref 13.0–17.0)
Immature Granulocytes: 1 %
Lymphocytes Relative: 9 %
Lymphs Abs: 0.6 10*3/uL — ABNORMAL LOW (ref 0.7–4.0)
MCH: 31.9 pg (ref 26.0–34.0)
MCHC: 31.6 g/dL (ref 30.0–36.0)
MCV: 100.8 fL — ABNORMAL HIGH (ref 80.0–100.0)
Monocytes Absolute: 0.6 10*3/uL (ref 0.1–1.0)
Monocytes Relative: 9 %
Neutro Abs: 4.6 10*3/uL (ref 1.7–7.7)
Neutrophils Relative %: 69 %
Platelets: 146 10*3/uL — ABNORMAL LOW (ref 150–400)
RBC: 3.61 MIL/uL — ABNORMAL LOW (ref 4.22–5.81)
RDW: 13.9 % (ref 11.5–15.5)
WBC: 6.6 10*3/uL (ref 4.0–10.5)
nRBC: 0 % (ref 0.0–0.2)

## 2021-01-25 MED ORDER — ATORVASTATIN 20 MG TABLET
ORAL_TABLET | Freq: Every day | ORAL | 11 refills | 30 days | Status: CP
Start: 2021-01-25 — End: ?

## 2021-01-25 MED ORDER — PANTOPRAZOLE 40 MG TABLET,DELAYED RELEASE
ORAL_TABLET | Freq: Every day | ORAL | 11 refills | 30 days | Status: CP
Start: 2021-01-25 — End: ?

## 2021-01-25 NOTE — Unmapped (Signed)
This patient has been disenrolled from the [facility] specialty pharmacy services due to therapy completion - expected therapy completion date: 01/19/21.    Victor Dyer  [facility] Specialty Pharmacist

## 2021-01-27 LAB — TACROLIMUS LEVEL: Tacrolimus (FK506) - LabCorp: 7.5 ng/mL (ref 2.0–20.0)

## 2021-02-04 ENCOUNTER — Ambulatory Visit
Admit: 2021-02-04 | Discharge: 2021-02-06 | Disposition: A | Discharge status: Home Health | Payer: PRIVATE HEALTH INSURANCE | Admitting: Cardiovascular Disease

## 2021-02-04 DIAGNOSIS — R509 Fever, unspecified: Secondary | ICD-10-CM | POA: Diagnosis present

## 2021-02-05 MED ORDER — TORSEMIDE 100 MG TABLET
ORAL_TABLET | Freq: Two times a day (BID) | ORAL | 0 refills | 30 days | PRN
Start: 2021-02-05 — End: 2021-03-07

## 2021-02-06 MED ORDER — TORSEMIDE 100 MG TABLET
ORAL_TABLET | Freq: Two times a day (BID) | ORAL | 0 refills | 30 days | PRN
Start: 2021-02-06 — End: 2021-03-08

## 2021-02-16 ENCOUNTER — Other Ambulatory Visit (HOSPITAL_COMMUNITY)
Admission: RE | Admit: 2021-02-16 | Discharge: 2021-02-16 | Disposition: A | Discharge status: Home / Self Care | Payer: 59 | Source: Ambulatory Visit | Attending: Adult Health Nurse Practitioner | Admitting: Adult Health Nurse Practitioner

## 2021-02-16 DIAGNOSIS — Z79899 Other long term (current) drug therapy: Secondary | ICD-10-CM | POA: Diagnosis not present

## 2021-02-16 DIAGNOSIS — Z48298 Encounter for aftercare following other organ transplant: Secondary | ICD-10-CM | POA: Insufficient documentation

## 2021-02-16 DIAGNOSIS — Z941 Heart transplant status: Secondary | ICD-10-CM | POA: Insufficient documentation

## 2021-02-16 DIAGNOSIS — R7989 Other specified abnormal findings of blood chemistry: Secondary | ICD-10-CM | POA: Insufficient documentation

## 2021-02-16 LAB — COMPREHENSIVE METABOLIC PANEL
ALT: 18 U/L (ref 0–44)
AST: 21 U/L (ref 15–41)
Albumin: 3.5 g/dL (ref 3.5–5.0)
Alkaline Phosphatase: 114 U/L (ref 38–126)
Anion gap: 12 (ref 5–15)
BUN: 42 mg/dL — ABNORMAL HIGH (ref 8–23)
CO2: 32 mmol/L (ref 22–32)
Calcium: 9.2 mg/dL (ref 8.9–10.3)
Chloride: 97 mmol/L — ABNORMAL LOW (ref 98–111)
Creatinine, Ser: 4.92 mg/dL — ABNORMAL HIGH (ref 0.61–1.24)
GFR, Estimated: 12 mL/min — ABNORMAL LOW (ref >60)
Glucose, Bld: 90 mg/dL (ref 70–99)
Potassium: 4.8 mmol/L (ref 3.5–5.1)
Sodium: 141 mmol/L (ref 135–145)
Total Bilirubin: 0.6 mg/dL (ref 0.3–1.2)
Total Protein: 6.7 g/dL (ref 6.5–8.1)

## 2021-02-16 LAB — CBC WITH DIFFERENTIAL/PLATELET
Abs Immature Granulocytes: 0.03 10*3/uL (ref 0.00–0.07)
Basophils Absolute: 0 10*3/uL (ref 0.0–0.1)
Basophils Relative: 0 %
Eosinophils Absolute: 0.5 10*3/uL (ref 0.0–0.5)
Eosinophils Relative: 8 %
HCT: 33 % — ABNORMAL LOW (ref 39.0–52.0)
Hemoglobin: 10.1 g/dL — ABNORMAL LOW (ref 13.0–17.0)
Immature Granulocytes: 1 %
Lymphocytes Relative: 10 %
Lymphs Abs: 0.6 10*3/uL — ABNORMAL LOW (ref 0.7–4.0)
MCH: 30.9 pg (ref 26.0–34.0)
MCHC: 30.6 g/dL (ref 30.0–36.0)
MCV: 100.9 fL — ABNORMAL HIGH (ref 80.0–100.0)
Monocytes Absolute: 0.7 10*3/uL (ref 0.1–1.0)
Monocytes Relative: 12 %
Neutro Abs: 4.1 10*3/uL (ref 1.7–7.7)
Neutrophils Relative %: 69 %
Platelets: 186 10*3/uL (ref 150–400)
RBC: 3.27 MIL/uL — ABNORMAL LOW (ref 4.22–5.81)
RDW: 13.8 % (ref 11.5–15.5)
WBC: 5.9 10*3/uL (ref 4.0–10.5)
nRBC: 0 % (ref 0.0–0.2)

## 2021-02-18 LAB — TACROLIMUS LEVEL: Tacrolimus (FK506) - LabCorp: 6.1 ng/mL (ref 2.0–20.0)

## 2021-02-26 DIAGNOSIS — N186 End stage renal disease: Principal | ICD-10-CM

## 2021-03-02 MED ORDER — MIRTAZAPINE 45 MG TABLET
ORAL_TABLET | Freq: Every evening | ORAL | 2 refills | 30 days | Status: CP
Start: 2021-03-02 — End: 2021-05-31

## 2021-03-09 ENCOUNTER — Other Ambulatory Visit (HOSPITAL_COMMUNITY)
Admission: RE | Admit: 2021-03-09 | Discharge: 2021-03-09 | Disposition: A | Discharge status: Home / Self Care | Payer: 59 | Source: Ambulatory Visit | Attending: Cardiology | Admitting: Cardiology

## 2021-03-09 DIAGNOSIS — Z48298 Encounter for aftercare following other organ transplant: Secondary | ICD-10-CM | POA: Diagnosis not present

## 2021-03-09 DIAGNOSIS — R7989 Other specified abnormal findings of blood chemistry: Secondary | ICD-10-CM | POA: Diagnosis not present

## 2021-03-09 DIAGNOSIS — Z941 Heart transplant status: Secondary | ICD-10-CM | POA: Diagnosis present

## 2021-03-09 DIAGNOSIS — Z79899 Other long term (current) drug therapy: Secondary | ICD-10-CM | POA: Diagnosis not present

## 2021-03-09 LAB — CBC WITH DIFFERENTIAL/PLATELET
Abs Immature Granulocytes: 0.08 10*3/uL — ABNORMAL HIGH (ref 0.00–0.07)
Basophils Absolute: 0 10*3/uL (ref 0.0–0.1)
Basophils Relative: 0 %
Eosinophils Absolute: 0.4 10*3/uL (ref 0.0–0.5)
Eosinophils Relative: 9 %
HCT: 36.2 % — ABNORMAL LOW (ref 39.0–52.0)
Hemoglobin: 11.3 g/dL — ABNORMAL LOW (ref 13.0–17.0)
Immature Granulocytes: 2 %
Lymphocytes Relative: 13 %
Lymphs Abs: 0.6 10*3/uL — ABNORMAL LOW (ref 0.7–4.0)
MCH: 30.8 pg (ref 26.0–34.0)
MCHC: 31.2 g/dL (ref 30.0–36.0)
MCV: 98.6 fL (ref 80.0–100.0)
Monocytes Absolute: 0.5 10*3/uL (ref 0.1–1.0)
Monocytes Relative: 12 %
Neutro Abs: 2.9 10*3/uL (ref 1.7–7.7)
Neutrophils Relative %: 64 %
Platelets: 177 10*3/uL (ref 150–400)
RBC: 3.67 MIL/uL — ABNORMAL LOW (ref 4.22–5.81)
RDW: 13.9 % (ref 11.5–15.5)
WBC: 4.5 10*3/uL (ref 4.0–10.5)
nRBC: 0 % (ref 0.0–0.2)

## 2021-03-09 LAB — COMPREHENSIVE METABOLIC PANEL
ALT: 17 U/L (ref 0–44)
AST: 19 U/L (ref 15–41)
Albumin: 3.6 g/dL (ref 3.5–5.0)
Alkaline Phosphatase: 107 U/L (ref 38–126)
Anion gap: 11 (ref 5–15)
BUN: 37 mg/dL — ABNORMAL HIGH (ref 8–23)
CO2: 31 mmol/L (ref 22–32)
Calcium: 9.3 mg/dL (ref 8.9–10.3)
Chloride: 92 mmol/L — ABNORMAL LOW (ref 98–111)
Creatinine, Ser: 4.84 mg/dL — ABNORMAL HIGH (ref 0.61–1.24)
GFR, Estimated: 13 mL/min — ABNORMAL LOW (ref >60)
Glucose, Bld: 87 mg/dL (ref 70–99)
Potassium: 4.7 mmol/L (ref 3.5–5.1)
Sodium: 134 mmol/L — ABNORMAL LOW (ref 135–145)
Total Bilirubin: 0.6 mg/dL (ref 0.3–1.2)
Total Protein: 6.8 g/dL (ref 6.5–8.1)

## 2021-03-11 LAB — TACROLIMUS LEVEL: Tacrolimus (FK506) - LabCorp: 8.7 ng/mL (ref 2.0–20.0)

## 2021-03-16 ENCOUNTER — Ambulatory Visit: Admit: 2021-03-16 | Discharge: 2021-03-16 | Payer: PRIVATE HEALTH INSURANCE

## 2021-03-16 MED ORDER — TORSEMIDE 100 MG TABLET
ORAL_TABLET | Freq: Two times a day (BID) | ORAL | 0 refills | 30 days | Status: CP | PRN
Start: 2021-03-16 — End: 2021-04-15

## 2021-03-18 MED ORDER — DILTIAZEM CD 120 MG CAPSULE,EXTENDED RELEASE 24 HR
ORAL_CAPSULE | 3 refills | 0 days | Status: CP
Start: 2021-03-18 — End: ?

## 2021-03-30 ENCOUNTER — Ambulatory Visit
Admit: 2021-03-30 | Discharge: 2021-03-31 | Payer: PRIVATE HEALTH INSURANCE | Attending: Vascular Surgery | Primary: Vascular Surgery

## 2021-03-30 DIAGNOSIS — N186 End stage renal disease: Principal | ICD-10-CM

## 2021-04-02 DIAGNOSIS — Z7682 Awaiting organ transplant status: Principal | ICD-10-CM

## 2021-04-02 MED ORDER — MIDODRINE 2.5 MG TABLET
ORAL_TABLET | Freq: Once | ORAL | 0 refills | 0 days | Status: CP | PRN
Start: 2021-04-02 — End: ?

## 2021-04-02 MED ORDER — DILTIAZEM CD 360 MG CAPSULE,EXTENDED RELEASE 24 HR
ORAL_CAPSULE | Freq: Every day | ORAL | 3 refills | 90 days | Status: CP
Start: 2021-04-02 — End: 2022-04-02

## 2021-04-05 DIAGNOSIS — M549 Dorsalgia, unspecified: Principal | ICD-10-CM

## 2021-04-06 MED ORDER — OXYCODONE 5 MG TABLET
ORAL_TABLET | Freq: Three times a day (TID) | ORAL | 0 refills | 4 days | Status: CP | PRN
Start: 2021-04-06 — End: ?

## 2021-04-06 MED ORDER — CYCLOBENZAPRINE 5 MG TABLET
ORAL_TABLET | Freq: Three times a day (TID) | ORAL | 0 refills | 10 days | Status: CP | PRN
Start: 2021-04-06 — End: ?

## 2021-04-11 MED ORDER — TORSEMIDE 100 MG TABLET
ORAL_TABLET | 0 refills | 0 days
Start: 2021-04-11 — End: ?

## 2021-04-12 MED ORDER — TORSEMIDE 100 MG TABLET
ORAL_TABLET | Freq: Two times a day (BID) | ORAL | 0 refills | 30.00000 days | Status: CP | PRN
Start: 2021-04-12 — End: 2021-05-12

## 2021-04-14 ENCOUNTER — Other Ambulatory Visit (HOSPITAL_COMMUNITY)
Admission: RE | Admit: 2021-04-14 | Discharge: 2021-04-14 | Disposition: A | Discharge status: Home / Self Care | Payer: 59 | Source: Ambulatory Visit | Attending: Adult Health Nurse Practitioner | Admitting: Adult Health Nurse Practitioner

## 2021-04-14 DIAGNOSIS — E785 Hyperlipidemia, unspecified: Secondary | ICD-10-CM | POA: Diagnosis present

## 2021-04-14 DIAGNOSIS — Z48298 Encounter for aftercare following other organ transplant: Secondary | ICD-10-CM | POA: Diagnosis not present

## 2021-04-14 DIAGNOSIS — Z941 Heart transplant status: Secondary | ICD-10-CM | POA: Diagnosis not present

## 2021-04-14 DIAGNOSIS — Z79899 Other long term (current) drug therapy: Secondary | ICD-10-CM | POA: Insufficient documentation

## 2021-04-14 DIAGNOSIS — R7989 Other specified abnormal findings of blood chemistry: Secondary | ICD-10-CM | POA: Diagnosis not present

## 2021-04-14 LAB — CBC WITH DIFFERENTIAL/PLATELET
Abs Immature Granulocytes: 0.1 10*3/uL — ABNORMAL HIGH (ref 0.00–0.07)
Basophils Absolute: 0 10*3/uL (ref 0.0–0.1)
Basophils Relative: 0 %
Eosinophils Absolute: 0.2 10*3/uL (ref 0.0–0.5)
Eosinophils Relative: 5 %
HCT: 38.8 % — ABNORMAL LOW (ref 39.0–52.0)
Hemoglobin: 12 g/dL — ABNORMAL LOW (ref 13.0–17.0)
Immature Granulocytes: 2 %
Lymphocytes Relative: 11 %
Lymphs Abs: 0.6 10*3/uL — ABNORMAL LOW (ref 0.7–4.0)
MCH: 29.7 pg (ref 26.0–34.0)
MCHC: 30.9 g/dL (ref 30.0–36.0)
MCV: 96 fL (ref 80.0–100.0)
Monocytes Absolute: 0.6 10*3/uL (ref 0.1–1.0)
Monocytes Relative: 11 %
Neutro Abs: 3.7 10*3/uL (ref 1.7–7.7)
Neutrophils Relative %: 71 %
Platelets: 223 10*3/uL (ref 150–400)
RBC: 4.04 MIL/uL — ABNORMAL LOW (ref 4.22–5.81)
RDW: 14.4 % (ref 11.5–15.5)
WBC: 5.1 10*3/uL (ref 4.0–10.5)
nRBC: 0 % (ref 0.0–0.2)

## 2021-04-14 LAB — COMPREHENSIVE METABOLIC PANEL
ALT: 11 U/L (ref 0–44)
AST: 18 U/L (ref 15–41)
Albumin: 3.8 g/dL (ref 3.5–5.0)
Alkaline Phosphatase: 104 U/L (ref 38–126)
Anion gap: 13 (ref 5–15)
BUN: 43 mg/dL — ABNORMAL HIGH (ref 8–23)
CO2: 30 mmol/L (ref 22–32)
Calcium: 9.4 mg/dL (ref 8.9–10.3)
Chloride: 96 mmol/L — ABNORMAL LOW (ref 98–111)
Creatinine, Ser: 6.62 mg/dL — ABNORMAL HIGH (ref 0.61–1.24)
GFR, Estimated: 9 mL/min — ABNORMAL LOW (ref >60)
Glucose, Bld: 106 mg/dL — ABNORMAL HIGH (ref 70–99)
Potassium: 4.6 mmol/L (ref 3.5–5.1)
Sodium: 139 mmol/L (ref 135–145)
Total Bilirubin: 0.9 mg/dL (ref 0.3–1.2)
Total Protein: 7.5 g/dL (ref 6.5–8.1)

## 2021-04-14 MED ORDER — ATORVASTATIN 20 MG TABLET
ORAL_TABLET | Freq: Every day | ORAL | 11 refills | 30 days
Start: 2021-04-14 — End: ?

## 2021-04-15 ENCOUNTER — Ambulatory Visit: Admit: 2021-04-15 | Discharge: 2021-04-16 | Payer: PRIVATE HEALTH INSURANCE

## 2021-04-15 DIAGNOSIS — M549 Dorsalgia, unspecified: Principal | ICD-10-CM

## 2021-04-15 DIAGNOSIS — M5136 Other intervertebral disc degeneration, lumbar region: Principal | ICD-10-CM

## 2021-04-15 DIAGNOSIS — M533 Sacrococcygeal disorders, not elsewhere classified: Principal | ICD-10-CM

## 2021-04-15 MED ORDER — ATORVASTATIN 20 MG TABLET
ORAL_TABLET | Freq: Every day | ORAL | 11 refills | 30 days | Status: CP
Start: 2021-04-15 — End: ?

## 2021-04-16 LAB — TACROLIMUS LEVEL: Tacrolimus (FK506) - LabCorp: 8 ng/mL (ref 2.0–20.0)

## 2021-04-17 ENCOUNTER — Ambulatory Visit: Admit: 2021-04-17 | Discharge: 2021-04-17 | Payer: PRIVATE HEALTH INSURANCE

## 2021-04-27 MED ORDER — VALSARTAN 40 MG TABLET
ORAL_TABLET | Freq: Every day | ORAL | 3 refills | 90 days | Status: CP
Start: 2021-04-27 — End: 2022-04-27

## 2021-05-06 ENCOUNTER — Ambulatory Visit: Admit: 2021-05-06 | Discharge: 2021-05-06 | Payer: PRIVATE HEALTH INSURANCE

## 2021-05-06 DIAGNOSIS — N186 End stage renal disease: Principal | ICD-10-CM

## 2021-05-11 MED ORDER — TORSEMIDE 100 MG TABLET
ORAL_TABLET | 0 refills | 0.00000 days
Start: 2021-05-11 — End: ?

## 2021-05-13 ENCOUNTER — Ambulatory Visit: Admit: 2021-05-13 | Discharge: 2021-05-13 | Payer: PRIVATE HEALTH INSURANCE

## 2021-05-13 MED ORDER — TORSEMIDE 100 MG TABLET
ORAL_TABLET | 0 refills | 0.00000 days | Status: CP
Start: 2021-05-13 — End: ?

## 2021-05-13 MED ORDER — MIRTAZAPINE 45 MG TABLET
ORAL_TABLET | 0 refills | 0 days
Start: 2021-05-13 — End: ?

## 2021-05-14 MED ORDER — MIRTAZAPINE 45 MG TABLET
ORAL_TABLET | 3 refills | 0 days | Status: CP
Start: 2021-05-14 — End: ?

## 2021-05-18 ENCOUNTER — Ambulatory Visit: Admit: 2021-05-18 | Discharge: 2021-05-19 | Payer: PRIVATE HEALTH INSURANCE

## 2021-05-18 DIAGNOSIS — Z941 Heart transplant status: Principal | ICD-10-CM

## 2021-05-18 DIAGNOSIS — J90 Pleural effusion, not elsewhere classified: Principal | ICD-10-CM

## 2021-05-18 DIAGNOSIS — J9611 Chronic respiratory failure with hypoxia: Principal | ICD-10-CM

## 2021-05-18 DIAGNOSIS — I10 Essential (primary) hypertension: Principal | ICD-10-CM

## 2021-05-20 ENCOUNTER — Encounter
Admit: 2021-05-20 | Discharge: 2021-05-20 | Payer: PRIVATE HEALTH INSURANCE | Attending: Registered Nurse | Primary: Registered Nurse

## 2021-05-20 ENCOUNTER — Ambulatory Visit: Admit: 2021-05-20 | Discharge: 2021-05-20 | Payer: PRIVATE HEALTH INSURANCE

## 2021-05-20 DIAGNOSIS — I10 Essential (primary) hypertension: Principal | ICD-10-CM

## 2021-05-20 MED ORDER — ERYTHROMYCIN 5 MG/GRAM (0.5 %) EYE OINTMENT
Freq: Four times a day (QID) | OPHTHALMIC | 0 refills | 5.00000 days | Status: CP
Start: 2021-05-20 — End: 2021-05-25

## 2021-05-20 MED ORDER — CLOPIDOGREL 75 MG TABLET
ORAL_TABLET | Freq: Every day | ORAL | 2 refills | 30.00000 days | Status: CP
Start: 2021-05-20 — End: 2021-08-18

## 2021-05-20 MED ORDER — ARTIFICIAL TEARS (DEXTRAN 70-HYPROMELLOSE) EYE DROPS
OPHTHALMIC | 0 refills | 34.00000 days | Status: CP
Start: 2021-05-20 — End: 2021-05-25

## 2021-05-21 MED ORDER — VALSARTAN 80 MG TABLET
ORAL_TABLET | Freq: Every day | ORAL | 3 refills | 90 days | Status: CP
Start: 2021-05-21 — End: 2022-05-21

## 2021-05-25 ENCOUNTER — Other Ambulatory Visit (HOSPITAL_COMMUNITY): Payer: Self-pay | Admitting: Cardiology

## 2021-05-25 ENCOUNTER — Other Ambulatory Visit: Payer: Self-pay

## 2021-05-25 ENCOUNTER — Ambulatory Visit (HOSPITAL_COMMUNITY)
Admission: RE | Admit: 2021-05-25 | Discharge: 2021-05-25 | Disposition: A | Discharge status: Home / Self Care | Payer: 59 | Source: Ambulatory Visit | Attending: Cardiology | Admitting: Cardiology

## 2021-05-25 ENCOUNTER — Other Ambulatory Visit (HOSPITAL_COMMUNITY)
Admission: RE | Admit: 2021-05-25 | Discharge: 2021-05-25 | Disposition: A | Discharge status: Home / Self Care | Payer: 59 | Source: Home / Self Care

## 2021-05-25 DIAGNOSIS — Z48298 Encounter for aftercare following other organ transplant: Secondary | ICD-10-CM | POA: Insufficient documentation

## 2021-05-25 DIAGNOSIS — K5909 Other constipation: Secondary | ICD-10-CM | POA: Insufficient documentation

## 2021-05-25 LAB — CBC WITH DIFFERENTIAL/PLATELET
Abs Immature Granulocytes: 0.5 10*3/uL — ABNORMAL HIGH (ref 0.00–0.07)
Basophils Absolute: 0 10*3/uL (ref 0.0–0.1)
Basophils Relative: 0 %
Eosinophils Absolute: 0.2 10*3/uL (ref 0.0–0.5)
Eosinophils Relative: 2 %
HCT: 30.1 % — ABNORMAL LOW (ref 39.0–52.0)
Hemoglobin: 9.3 g/dL — ABNORMAL LOW (ref 13.0–17.0)
Immature Granulocytes: 5 %
Lymphocytes Relative: 6 %
Lymphs Abs: 0.5 10*3/uL — ABNORMAL LOW (ref 0.7–4.0)
MCH: 29.8 pg (ref 26.0–34.0)
MCHC: 30.9 g/dL (ref 30.0–36.0)
MCV: 96.5 fL (ref 80.0–100.0)
Monocytes Absolute: 1.2 10*3/uL — ABNORMAL HIGH (ref 0.1–1.0)
Monocytes Relative: 13 %
Neutro Abs: 6.8 10*3/uL (ref 1.7–7.7)
Neutrophils Relative %: 74 %
Platelets: 195 10*3/uL (ref 150–400)
RBC: 3.12 MIL/uL — ABNORMAL LOW (ref 4.22–5.81)
RDW: 13.7 % (ref 11.5–15.5)
WBC: 9.2 10*3/uL (ref 4.0–10.5)
nRBC: 0 % (ref 0.0–0.2)

## 2021-05-26 DIAGNOSIS — Z941 Heart transplant status: Principal | ICD-10-CM

## 2021-05-27 ENCOUNTER — Ambulatory Visit: Admit: 2021-05-27 | Discharge: 2021-05-27 | Payer: PRIVATE HEALTH INSURANCE

## 2021-05-27 ENCOUNTER — Ambulatory Visit
Admit: 2021-05-27 | Discharge: 2021-05-27 | Payer: PRIVATE HEALTH INSURANCE | Attending: Adult Health | Primary: Adult Health

## 2021-05-27 DIAGNOSIS — T86298 Other complications of heart transplant: Principal | ICD-10-CM

## 2021-05-27 DIAGNOSIS — Z941 Heart transplant status: Principal | ICD-10-CM

## 2021-05-27 MED ORDER — CALCIUM CARBONATE 600 MG CALCIUM (1,500 MG) TABLET
ORAL_TABLET | Freq: Every day | ORAL | 11 refills | 30.00000 days
Start: 2021-05-27 — End: ?

## 2021-05-27 MED ORDER — DILTIAZEM CD 180 MG CAPSULE,EXTENDED RELEASE 24 HR
ORAL_CAPSULE | Freq: Every day | ORAL | 3 refills | 90 days | Status: CP
Start: 2021-05-27 — End: 2022-05-27

## 2021-06-10 MED ORDER — TORSEMIDE 100 MG TABLET
ORAL_TABLET | 0 refills | 0 days
Start: 2021-06-10 — End: ?

## 2021-06-15 ENCOUNTER — Ambulatory Visit
Admit: 2021-06-15 | Discharge: 2021-06-16 | Payer: PRIVATE HEALTH INSURANCE | Attending: Vascular Surgery | Primary: Vascular Surgery

## 2021-06-15 DIAGNOSIS — N186 End stage renal disease: Principal | ICD-10-CM

## 2021-06-16 MED ORDER — MIDODRINE 5 MG TABLET
ORAL_TABLET | Freq: Three times a day (TID) | ORAL | 3 refills | 30 days | Status: CP
Start: 2021-06-16 — End: 2021-07-16

## 2021-06-19 ENCOUNTER — Emergency Department (HOSPITAL_COMMUNITY): Payer: 59

## 2021-06-19 ENCOUNTER — Inpatient Hospital Stay (HOSPITAL_COMMUNITY): Payer: 59

## 2021-06-19 ENCOUNTER — Inpatient Hospital Stay (HOSPITAL_COMMUNITY)
Admission: EM | Admit: 2021-06-19 | Discharge: 2021-06-25 | DRG: 871 | Disposition: A | Discharge status: Home / Self Care | Payer: 59 | Attending: Internal Medicine | Admitting: Internal Medicine

## 2021-06-19 DIAGNOSIS — Z20822 Contact with and (suspected) exposure to covid-19: Secondary | ICD-10-CM | POA: Diagnosis present

## 2021-06-19 DIAGNOSIS — I5022 Chronic systolic (congestive) heart failure: Secondary | ICD-10-CM | POA: Diagnosis present

## 2021-06-19 DIAGNOSIS — J9621 Acute and chronic respiratory failure with hypoxia: Secondary | ICD-10-CM | POA: Diagnosis present

## 2021-06-19 DIAGNOSIS — R6520 Severe sepsis without septic shock: Secondary | ICD-10-CM | POA: Diagnosis not present

## 2021-06-19 DIAGNOSIS — Z7902 Long term (current) use of antithrombotics/antiplatelets: Secondary | ICD-10-CM

## 2021-06-19 DIAGNOSIS — J9601 Acute respiratory failure with hypoxia: Secondary | ICD-10-CM | POA: Diagnosis not present

## 2021-06-19 DIAGNOSIS — I132 Hypertensive heart and chronic kidney disease with heart failure and with stage 5 chronic kidney disease, or end stage renal disease: Secondary | ICD-10-CM | POA: Diagnosis present

## 2021-06-19 DIAGNOSIS — I10 Essential (primary) hypertension: Secondary | ICD-10-CM | POA: Diagnosis not present

## 2021-06-19 DIAGNOSIS — D6959 Other secondary thrombocytopenia: Secondary | ICD-10-CM | POA: Diagnosis present

## 2021-06-19 DIAGNOSIS — Z79899 Other long term (current) drug therapy: Secondary | ICD-10-CM | POA: Diagnosis not present

## 2021-06-19 DIAGNOSIS — D849 Immunodeficiency, unspecified: Secondary | ICD-10-CM | POA: Diagnosis not present

## 2021-06-19 DIAGNOSIS — E785 Hyperlipidemia, unspecified: Secondary | ICD-10-CM | POA: Diagnosis present

## 2021-06-19 DIAGNOSIS — Z7982 Long term (current) use of aspirin: Secondary | ICD-10-CM | POA: Diagnosis not present

## 2021-06-19 DIAGNOSIS — R000 Tachycardia, unspecified: Secondary | ICD-10-CM | POA: Diagnosis not present

## 2021-06-19 DIAGNOSIS — D84821 Immunodeficiency due to drugs: Secondary | ICD-10-CM | POA: Diagnosis present

## 2021-06-19 DIAGNOSIS — R300 Dysuria: Secondary | ICD-10-CM | POA: Diagnosis present

## 2021-06-19 DIAGNOSIS — A419 Sepsis, unspecified organism: Secondary | ICD-10-CM | POA: Diagnosis present

## 2021-06-19 DIAGNOSIS — J189 Pneumonia, unspecified organism: Secondary | ICD-10-CM

## 2021-06-19 DIAGNOSIS — Z888 Allergy status to other drugs, medicaments and biological substances status: Secondary | ICD-10-CM | POA: Diagnosis not present

## 2021-06-19 DIAGNOSIS — Z941 Heart transplant status: Secondary | ICD-10-CM | POA: Diagnosis not present

## 2021-06-19 DIAGNOSIS — N186 End stage renal disease: Secondary | ICD-10-CM

## 2021-06-19 DIAGNOSIS — I255 Ischemic cardiomyopathy: Secondary | ICD-10-CM | POA: Diagnosis present

## 2021-06-19 DIAGNOSIS — J90 Pleural effusion, not elsewhere classified: Secondary | ICD-10-CM

## 2021-06-19 DIAGNOSIS — Z992 Dependence on renal dialysis: Secondary | ICD-10-CM | POA: Diagnosis not present

## 2021-06-19 DIAGNOSIS — I82403 Acute embolism and thrombosis of unspecified deep veins of lower extremity, bilateral: Secondary | ICD-10-CM | POA: Diagnosis not present

## 2021-06-19 DIAGNOSIS — Z9889 Other specified postprocedural states: Secondary | ICD-10-CM

## 2021-06-19 DIAGNOSIS — D638 Anemia in other chronic diseases classified elsewhere: Secondary | ICD-10-CM | POA: Diagnosis present

## 2021-06-19 DIAGNOSIS — R509 Fever, unspecified: Secondary | ICD-10-CM | POA: Diagnosis present

## 2021-06-19 LAB — CBC WITH DIFFERENTIAL/PLATELET
Abs Immature Granulocytes: 0.88 10*3/uL — ABNORMAL HIGH (ref 0.00–0.07)
Basophils Absolute: 0 10*3/uL (ref 0.0–0.1)
Basophils Relative: 0 %
Eosinophils Absolute: 0 10*3/uL (ref 0.0–0.5)
Eosinophils Relative: 0 %
HCT: 30 % — ABNORMAL LOW (ref 39.0–52.0)
Hemoglobin: 8.7 g/dL — ABNORMAL LOW (ref 13.0–17.0)
Immature Granulocytes: 10 %
Lymphocytes Relative: 5 %
Lymphs Abs: 0.5 10*3/uL — ABNORMAL LOW (ref 0.7–4.0)
MCH: 29.5 pg (ref 26.0–34.0)
MCHC: 29 g/dL — ABNORMAL LOW (ref 30.0–36.0)
MCV: 101.7 fL — ABNORMAL HIGH (ref 80.0–100.0)
Monocytes Absolute: 1.1 10*3/uL — ABNORMAL HIGH (ref 0.1–1.0)
Monocytes Relative: 12 %
Neutro Abs: 6.9 10*3/uL (ref 1.7–7.7)
Neutrophils Relative %: 73 %
Platelets: 175 10*3/uL (ref 150–400)
RBC: 2.95 MIL/uL — ABNORMAL LOW (ref 4.22–5.81)
RDW: 14.9 % (ref 11.5–15.5)
Smear Review: NORMAL
WBC: 9.3 10*3/uL (ref 4.0–10.5)
nRBC: 0 % (ref 0.0–0.2)

## 2021-06-19 LAB — TROPONIN I (HIGH SENSITIVITY)
Troponin I (High Sensitivity): 19 ng/L — ABNORMAL HIGH (ref <18)
Troponin I (High Sensitivity): 20 ng/L — ABNORMAL HIGH (ref <18)

## 2021-06-19 LAB — RESP PANEL BY RT-PCR (FLU A&B, COVID) ARPGX2
Influenza A by PCR: NEGATIVE
Influenza B by PCR: NEGATIVE
SARS Coronavirus 2 by RT PCR: NEGATIVE

## 2021-06-19 LAB — COMPREHENSIVE METABOLIC PANEL
ALT: 10 U/L (ref 0–44)
AST: 16 U/L (ref 15–41)
Albumin: 3.6 g/dL (ref 3.5–5.0)
Alkaline Phosphatase: 75 U/L (ref 38–126)
Anion gap: 12 (ref 5–15)
BUN: 24 mg/dL — ABNORMAL HIGH (ref 8–23)
CO2: 30 mmol/L (ref 22–32)
Calcium: 8.8 mg/dL — ABNORMAL LOW (ref 8.9–10.3)
Chloride: 97 mmol/L — ABNORMAL LOW (ref 98–111)
Creatinine, Ser: 5.76 mg/dL — ABNORMAL HIGH (ref 0.61–1.24)
GFR, Estimated: 10 mL/min — ABNORMAL LOW (ref >60)
Glucose, Bld: 101 mg/dL — ABNORMAL HIGH (ref 70–99)
Potassium: 4.3 mmol/L (ref 3.5–5.1)
Sodium: 139 mmol/L (ref 135–145)
Total Bilirubin: 0.8 mg/dL (ref 0.3–1.2)
Total Protein: 6.6 g/dL (ref 6.5–8.1)

## 2021-06-19 LAB — LACTIC ACID, PLASMA
Lactic Acid, Venous: 1.8 mmol/L (ref 0.5–1.9)
Lactic Acid, Venous: 1.9 mmol/L (ref 0.5–1.9)

## 2021-06-19 LAB — LIPASE, BLOOD: Lipase: 69 U/L — ABNORMAL HIGH (ref 11–51)

## 2021-06-19 LAB — BRAIN NATRIURETIC PEPTIDE: B Natriuretic Peptide: 287.5 pg/mL — ABNORMAL HIGH (ref 0.0–100.0)

## 2021-06-19 MED ORDER — DILTIAZEM HCL ER COATED BEADS 180 MG PO CP24
180.0000 mg | ORAL_CAPSULE | Freq: Every evening | ORAL | Status: DC
  Administered 2021-06-19 – 2021-06-24 (×6): 180 mg via ORAL
  Filled 2021-06-19 (×7): qty 1

## 2021-06-19 MED ORDER — LACTATED RINGERS IV BOLUS
500.0000 mL | Freq: Once | INTRAVENOUS | Status: AC
  Administered 2021-06-19: 500 mL via INTRAVENOUS

## 2021-06-19 MED ORDER — TACROLIMUS 1 MG PO CAPS
6.0000 mg | ORAL_CAPSULE | Freq: Two times a day (BID) | ORAL | Status: DC
  Administered 2021-06-19 – 2021-06-25 (×12): 6 mg via ORAL
  Filled 2021-06-19 (×13): qty 6

## 2021-06-19 MED ORDER — ONDANSETRON HCL 4 MG PO TABS: 4.0000 mg | ORAL_TABLET | Freq: Four times a day (QID) | ORAL | Status: DC | PRN

## 2021-06-19 MED ORDER — PIPERACILLIN-TAZOBACTAM 3.375 G IVPB
3.3750 g | Freq: Once | INTRAVENOUS | Status: DC
  Filled 2021-06-19: qty 50

## 2021-06-19 MED ORDER — TACROLIMUS 1 MG PO CAPS: 1.0000 mg | ORAL_CAPSULE | Freq: Two times a day (BID) | ORAL | Status: DC

## 2021-06-19 MED ORDER — CALCIUM CARBONATE 1250 (500 CA) MG PO TABS
1250.0000 mg | ORAL_TABLET | Freq: Every day | ORAL | Status: DC
  Administered 2021-06-20 – 2021-06-25 (×6): 1250 mg via ORAL
  Filled 2021-06-19 (×6): qty 1

## 2021-06-19 MED ORDER — ASPIRIN EC 81 MG PO TBEC
81.0000 mg | DELAYED_RELEASE_TABLET | Freq: Every morning | ORAL | Status: DC
  Administered 2021-06-20 – 2021-06-21 (×2): 81 mg via ORAL
  Filled 2021-06-19 (×3): qty 1

## 2021-06-19 MED ORDER — PANTOPRAZOLE SODIUM 40 MG PO TBEC
40.0000 mg | DELAYED_RELEASE_TABLET | Freq: Every morning | ORAL | Status: DC
  Administered 2021-06-20 – 2021-06-25 (×6): 40 mg via ORAL
  Filled 2021-06-19 (×5): qty 1

## 2021-06-19 MED ORDER — ACETAMINOPHEN 650 MG RE SUPP: 650.0000 mg | Freq: Four times a day (QID) | RECTAL | Status: DC | PRN

## 2021-06-19 MED ORDER — ACETAMINOPHEN 325 MG PO TABS
650.0000 mg | ORAL_TABLET | Freq: Four times a day (QID) | ORAL | Status: DC | PRN
  Administered 2021-06-21 – 2021-06-22 (×2): 650 mg via ORAL
  Filled 2021-06-19 (×2): qty 2

## 2021-06-19 MED ORDER — MYCOPHENOLATE SODIUM 180 MG PO TBEC
180.0000 mg | DELAYED_RELEASE_TABLET | Freq: Two times a day (BID) | ORAL | Status: DC
  Administered 2021-06-19 – 2021-06-25 (×12): 180 mg via ORAL
  Filled 2021-06-19 (×13): qty 1

## 2021-06-19 MED ORDER — MIRTAZAPINE 15 MG PO TABS
45.0000 mg | ORAL_TABLET | Freq: Every evening | ORAL | Status: DC
  Administered 2021-06-19 – 2021-06-24 (×6): 45 mg via ORAL
  Filled 2021-06-19 (×4): qty 3
  Filled 2021-06-19: qty 1
  Filled 2021-06-19: qty 3
  Filled 2021-06-19: qty 1

## 2021-06-19 MED ORDER — PIPERACILLIN-TAZOBACTAM IN DEX 2-0.25 GM/50ML IV SOLN
2.2500 g | Freq: Three times a day (TID) | INTRAVENOUS | Status: DC
  Administered 2021-06-19 – 2021-06-21 (×5): 2.25 g via INTRAVENOUS
  Filled 2021-06-19 (×7): qty 50

## 2021-06-19 MED ORDER — TACROLIMUS 1 MG PO CAPS: 5.0000 mg | ORAL_CAPSULE | Freq: Two times a day (BID) | ORAL | Status: DC

## 2021-06-19 MED ORDER — MYCOPHENOLATE SODIUM 180 MG PO TBEC
180.0000 mg | DELAYED_RELEASE_TABLET | Freq: Once | ORAL | Status: AC
  Administered 2021-06-20: 180 mg via ORAL
  Filled 2021-06-19: qty 1

## 2021-06-19 MED ORDER — VANCOMYCIN HCL 1500 MG/300ML IV SOLN
1500.0000 mg | Freq: Once | INTRAVENOUS | Status: AC
  Administered 2021-06-19: 1500 mg via INTRAVENOUS
  Filled 2021-06-19: qty 300

## 2021-06-19 MED ORDER — ONDANSETRON HCL 4 MG/2ML IJ SOLN
4.0000 mg | Freq: Four times a day (QID) | INTRAMUSCULAR | Status: DC | PRN
  Administered 2021-06-19: 4 mg via INTRAVENOUS
  Filled 2021-06-19: qty 2

## 2021-06-19 MED ORDER — CLOPIDOGREL BISULFATE 75 MG PO TABS
75.0000 mg | ORAL_TABLET | Freq: Every morning | ORAL | Status: DC
  Administered 2021-06-20: 75 mg via ORAL
  Filled 2021-06-19: qty 1

## 2021-06-19 MED ORDER — ATORVASTATIN CALCIUM 10 MG PO TABS
20.0000 mg | ORAL_TABLET | Freq: Every morning | ORAL | Status: DC
  Administered 2021-06-20 – 2021-06-25 (×6): 20 mg via ORAL
  Filled 2021-06-19 (×6): qty 2

## 2021-06-19 MED ORDER — VANCOMYCIN VARIABLE DOSE PER UNSTABLE RENAL FUNCTION (PHARMACIST DOSING): Status: DC

## 2021-06-19 MED ORDER — LEVALBUTEROL HCL 0.63 MG/3ML IN NEBU: 0.6300 mg | INHALATION_SOLUTION | Freq: Four times a day (QID) | RESPIRATORY_TRACT | Status: DC | PRN

## 2021-06-19 NOTE — H&P (Signed)
History and Physical  Patient Name: Rickey Erickson     [id number]    DOB: 1956/09/06    DOA: 06/19/2021 PCP: Rolene Course, MD  Patient coming from: Home  Chief Complaint: Fever, dyspnea    HPI: Rickey Erickson is a 65 y.o. male, with PMH of systolic CHF s/p LVAD and eventually heart transplant Q000111Q complicated by prolonged RF requiring trach (now decannulated), lung/diaphragm injury, multi-microbial PNA, bacteremia, trach and PEG placement, PEG perforation s/p ex-lap x 2 with GJ placement, AKI which has been dialysis dependentwho presented to the ER on 06/19/2021 with fever, general malaise, worsening hypoxia.  Patient states this morning he felt a little off, not right.  He checked his temperature was 102 F.  His heart rate also was more elevated than normal, 130s.  He also had an episode of coughing, but nonproductive.  He did have "a little burning" with urination but only urinates once a day.  He has dialysis dependent.  He last had dialysis on Friday, tolerated well, was down to his target weight.  He does typically use oxygen only at night, etiology unknown OSA and COPD.  However over the past week he has O2 requirements during the day.  He has a history of heart transplant in May 2021.  He is on mycophenolate and Prograf for his immunosuppression.  He also has previous history of different infections since his transplant.  He called his transplant coordinator who recommended him go to the ER.    ED course: -Vitals on admission: Temperature 98.8, heart rate 128, respiratory rate 23, blood pressure 130/82, satting 93% on nasal cannula -Labs on initial presentation: Sodium 139, potassium 4.3, chloride 97, bicarb 30, BUN 11 24, creatinine 5.76, lactic acid 1.8, troponin 19, WBC 9.3, hemoglobin 8.7, COVID-negative -Imaging obtained on admission: Chest x-ray on admission demonstrated moderate bilateral lower lobe atelectasis and/or infiltrate, along with air-fluid level overlying the lower  right lung which possibly represents underlying cavitary lesion -In the ED the patient was given LR 500 mL bolus, vancomycin, Zosyn.[facility] recommended transfer but medicine is not accepting transfers at this time.  And the hospitalist service was contacted for further evaluation and management.     ROS: A complete and thorough 12 point review of systems obtained, negative listed in HPI.     No past medical history on file.  No past surgical history on file.  Social History: Patient lives at home.  The patient walks without assistance.non Smoker.  Allergies  Allergen Reactions   Scopolamine Other (See Comments)     Hallucinations Induces severe delirium    Lorazepam Other (See Comments)    Hallucinations Severe somnolence/delirium     Family history: family history is not on file.  Prior to Admission medications   Medication Sig Start Date End Date Taking? Authorizing Provider  acetaminophen (TYLENOL) 500 MG tablet Take 500 mg by mouth every 6 (six) hours as needed for headache (pain).   Yes [provider]  aspirin 81 MG EC tablet Take 81 mg by mouth every morning. 08/27/20 08/27/21 Yes [provider]  atorvastatin (LIPITOR) 20 MG tablet Take 20 mg by mouth every morning. 09/10/20  Yes [provider]  calcium carbonate (OSCAL) 1500 (600 Ca) MG TABS tablet Take 1,500 mg by mouth every morning. 08/18/20  Yes [provider]  clopidogrel (PLAVIX) 75 MG tablet Take 75 mg by mouth every morning. 05/20/21  Yes [provider]  diltiazem (CARDIZEM CD) 180 MG 24 hr capsule Take 180 mg  by mouth every evening. 05/27/21  Yes [provider]  midodrine (PROAMATINE) 5 MG tablet Take 5 mg by mouth See admin instructions. Take one tablet (5 mg) by mouth on Monday, Wednesday, Friday during dialysis 09/08/20  Yes [provider]  mirtazapine (REMERON) 45 MG tablet Take 45 mg by mouth every evening. 05/18/21  Yes [provider]   mycophenolate (MYFORTIC) 180 MG EC tablet Take 180 mg by mouth 2 (two) times daily. 06/07/21  Yes [provider]  pantoprazole (PROTONIX) 40 MG tablet Take 40 mg by mouth every morning. 09/02/20  Yes [provider]  tacrolimus (PROGRAF) 1 MG capsule Take 1 mg by mouth 2 (two) times daily. Take with a 5 mg capsule for a total dose of 6 mg twice daily 09/04/20  Yes [provider]  tacrolimus (PROGRAF) 5 MG capsule Take 5 mg by mouth 2 (two) times daily. Take with a 1 mg capsule for a total dose of 6 mg twice daily 05/10/21  Yes [provider]  torsemide (DEMADEX) 100 MG tablet Take 100 mg by mouth See admin instructions. Take one tablet (100 mg) by mouth on Sunday, Tuesday, Thursday, Saturday mornings (non-dialysis days) 05/28/21  Yes [provider]       Physical Exam: BP (!) 175/85   Pulse (!) 123   Temp 98.8 F (37.1 C) (Oral)   Resp (!) 31   SpO2 100%   General appearance: Well-developed, adult male, alert and in no acute distress .   Eyes: Anicteric, conjunctiva pink, lids and lashes normal. PERRL.    ENT: No nasal deformity, discharge, epistaxis.  Hearing intact. OP moist without lesions.   Neck: No neck masses.  Trachea midline.  No thyromegaly/tenderness. Lymph: No cervical or supraclavicular lymphadenopathy. Skin: Warm and dry.  No jaundice.  No suspicious rashes or lesions. Cardiac: Tachycardic, left PermCath in place, no LE edema.  Radial and pedal pulses 2+ and symmetric. Respiratory: Coarse breath sounds bilaterally, no wheezing Abdomen: Abdomen soft.  No tenderness with palpation. No ascites, distension, hepatosplenomegaly.   MSK: No deformities or effusions of the large joints of the upper or lower extremities bilaterally.  No cyanosis or clubbing. Neuro: Cranial nerves 2 through 12 grossly intact.  Sensation intact to light touch. Speech is fluent.  Marland Kitchen    Psych: Sensorium intact and responding to questions, attention normal.   Behavior appropriate.  Judgment and insight appear normal.    Labs on Admission:  I have personally reviewed following labs and imaging studies: CBC: Recent Labs  Lab 06/19/21 1741  WBC 9.3  NEUTROABS 6.9  HGB 8.7*  HCT 30.0*  MCV 101.7*  PLT 0000000   Basic Metabolic Panel: Recent Labs  Lab 06/19/21 1741  NA 139  K 4.3  CL 97*  CO2 30  GLUCOSE 101*  BUN 24*  CREATININE 5.76*  CALCIUM 8.8*   GFR: CrCl cannot be calculated (Unknown ideal weight.).  Liver Function Tests: Recent Labs  Lab 06/19/21 1741  AST 16  ALT 10  ALKPHOS 75  BILITOT 0.8  PROT 6.6  ALBUMIN 3.6   Recent Labs  Lab 06/19/21 1741  LIPASE 69*   No results for input(s): AMMONIA in the last 168 hours. Coagulation Profile: No results for input(s): INR, PROTIME in the last 168 hours. Cardiac Enzymes: No results for input(s): CKTOTAL, CKMB, CKMBINDEX, TROPONINI in the last 168 hours. BNP (last 3 results) No results for input(s): PROBNP in the last 8760 hours. HbA1C: No results for input(s):  HGBA1C in the last 72 hours. CBG: No results for input(s): GLUCAP in the last 168 hours. Lipid Profile: No results for input(s): CHOL, HDL, LDLCALC, TRIG, CHOLHDL, LDLDIRECT in the last 72 hours. Thyroid Function Tests: No results for input(s): TSH, T4TOTAL, FREET4, T3FREE, THYROIDAB in the last 72 hours. Anemia Panel: No results for input(s): VITAMINB12, FOLATE, FERRITIN, TIBC, IRON, RETICCTPCT in the last 72 hours.   Recent Results (from the past 240 hour(s))  Resp Panel by RT-PCR (Flu A&B, Covid) Nasopharyngeal Swab     Status: None   Collection Time: 06/19/21  5:34 PM   Specimen: Nasopharyngeal Swab; Nasopharyngeal(NP) swabs in vial transport medium  Result Value Ref Range Status   SARS Coronavirus 2 by RT PCR NEGATIVE NEGATIVE Final    Comment: (NOTE) SARS-CoV-2 target nucleic acids are NOT DETECTED.  The SARS-CoV-2 RNA is generally detectable in upper respiratory specimens during the acute  phase of infection. The lowest concentration of SARS-CoV-2 viral copies this assay can detect is 138 copies/mL. A negative result does not preclude SARS-Cov-2 infection and should not be used as the sole basis for treatment or other patient management decisions. A negative result may occur with  improper specimen collection/handling, submission of specimen other than nasopharyngeal swab, presence of viral mutation(s) within the areas targeted by this assay, and inadequate number of viral copies(<138 copies/mL). A negative result must be combined with clinical observations, patient history, and epidemiological information. The expected result is Negative.  Fact Sheet for Patients:  EntrepreneurPulse.com.au  Fact Sheet for Healthcare Providers:  IncredibleEmployment.be  This test is no t yet approved or cleared by the [place] FDA and  has been authorized for detection and/or diagnosis of SARS-CoV-2 by FDA under an Emergency Use Authorization (EUA). This EUA will remain  in effect (meaning this test can be used) for the duration of the COVID-19 declaration under Section 564(b)(1) of the Act, 21 U.S.C.section 360bbb-3(b)(1), unless the authorization is terminated  or revoked sooner.       Influenza A by PCR NEGATIVE NEGATIVE Final   Influenza B by PCR NEGATIVE NEGATIVE Final    Comment: (NOTE) The Xpert Xpress SARS-CoV-2/FLU/RSV plus assay is intended as an aid in the diagnosis of influenza from Nasopharyngeal swab specimens and should not be used as a sole basis for treatment. Nasal washings and aspirates are unacceptable for Xpert Xpress SARS-CoV-2/FLU/RSV testing.  Fact Sheet for Patients: EntrepreneurPulse.com.au  Fact Sheet for Healthcare Providers: IncredibleEmployment.be  This test is not yet approved or cleared by the [place] FDA and has been authorized for detection and/or diagnosis of  SARS-CoV-2 by FDA under an Emergency Use Authorization (EUA). This EUA will remain in effect (meaning this test can be used) for the duration of the COVID-19 declaration under Section 564(b)(1) of the Act, 21 U.S.C. section 360bbb-3(b)(1), unless the authorization is terminated or revoked.  Performed at [facility], [place] [address], [place], [place] [address]            Radiological Exams on Admission: Personally reviewed imaging which shows: Chest x-ray on admission demonstrated moderate bilateral lower lobe atelectasis and/or infiltrate, along with air-fluid level overlying the lower right lung which possibly represents underlying cavitary lesion  DG Chest Portable 1 View  Result Date: 06/19/2021 CLINICAL DATA:  Fever and vomiting. EXAM: PORTABLE CHEST 1 VIEW COMPARISON:  May 25, 2021 FINDINGS: There is stable bilateral venous catheter positioning. Multiple sternal wires are seen. Mild to moderate severity areas of atelectasis and/or infiltrate are again  seen within the bilateral lower lobes. Small bilateral pleural effusions are noted. An air-fluid level is seen overlying the lower right lung. No pneumothorax is identified. The heart size and mediastinal contours are within normal limits. The visualized skeletal structures are unremarkable. IMPRESSION: 1. Mild to moderate severity bilateral lower lobe atelectasis and/or infiltrate. 2. Small bilateral pleural effusions. 3. Air-fluid level overlying the lower right lung which may represent the presence of an underlying cavitary lesion. Correlation with chest CT is recommended. Electronically Signed   By: Virgina Norfolk M.D.   On: 06/19/2021 19:48       Assessment/Plan   1.  Sepsis in immunocompromise patient -Febrile at home, 102 F, with tachycardia - H/O heart transplant on immunosuppression -Suspected source: Parent, lungs - Chest x-ray on admission demonstrated moderate bilateral lower lobe atelectasis and/or  infiltrate, along with air-fluid level overlying the lower right lung which possibly represents underlying cavitary lesion -CT chest pending - Blood cultures ordered -Sputum culture ordered - COVID and influenza negative -Broad-spectrum antibiotics initiated with vancomycin and Zosyn, continue for now.  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  CT chest pending, possibly would benefit from ID consult?  2.  History of heart transplant -History of heart transplant in May 2021 for ischemic cardiomyopathy - Continue home immunosuppression with mycophenolate and Prograf - FK level ordered for tomorrow 30 minutes prior to a.m. dose.  FK goal 6-10 - [facility] transplant coordinator would like transfer however medicine not currently excepting transfers  3.  ESRD -On hemodialysis MWF - No acute indication for renal placement therapy at this time -Plan to consult nephrology in the morning  4.  Hypertension -Resume home diltiazem - Continue close blood pressure monitoring  5.  Acute on chronic hypoxic respiratory failure -Patient has history of requiring O2 during the night only, however over the past week has required O2 supplementation during the day -Etiology of lung disease unknown - Continue O2 segmentation - Continue close pulse ox monitoring - Xopenex as needed  (given tachycardia)  6.  Sinus tachycardia -Suspect secondary to underlying infection - Continue home Cardizem - Telemetry      DVT prophylaxis: SCDs Code Status: Full Family Communication: Wife bedside Disposition Plan: Anticipate discharge home when medically optimized Consults called: None Admission status: Inpatient with telemetry     Medical decision making: Patient seen at 9:01 PM on 06/19/2021.  The patient was discussed with ER provider.  What exists of the patient's chart was reviewed in depth and summarized above.  Clinical condition: Fair.         Doran Heater [facility] Please page though [place] or Epic secure chat:  For password, contact charge nurse

## 2021-06-19 NOTE — Progress Notes (Signed)
Pharmacy Antibiotic Note  Rickey Erickson is a 65 y.o. male admitted on 06/19/2021 with sepsis.  Pharmacy has been consulted for vancomycin and zosyn dosing.  Patient has a history of systolic CHF s/p LVAD, heart transplant Q000111Q complicated by prolonged RF requiring trach (now decannulated), lung/diaphragm injury, multi-microbial PNA, bacteremia, trach and PEG placement, PEG perforation s/p ex-lap x 2 with GJ placement, ESRD on HD (MWF).   Patient presenting to ED with dyspnea and fever.  Plan: Zosyn 2.25g q8h Vancomycin '1500mg'$  once - subsequent dosing per HD schedule Monitor cultures De-escalate abx when appropriate     Temp (24hrs), Avg:98.8 F (37.1 C), Min:98.8 F (37.1 C), Max:98.8 F (37.1 C)  Recent Labs  Lab 06/19/21 1741 06/19/21 1914  WBC 9.3  --   CREATININE 5.76*  --   LATICACIDVEN 1.8 1.9    CrCl cannot be calculated (Unknown ideal weight.).    Allergies  Allergen Reactions   Scopolamine Other (See Comments)     Hallucinations Induces severe delirium    Lorazepam Other (See Comments)    Hallucinations Severe somnolence/delirium     Antimicrobials this admission: zosyn 8/27 >>  Vancomycin 8/27 >>    Microbiology results: Pending  Thank you for allowing pharmacy to be a part of this patient's care.  Heloise Purpura 06/19/2021 9:47 PM

## 2021-06-19 NOTE — ED Triage Notes (Signed)
Pt c/o fever, highest one at home 102, began mid-morning. States "a little" burning w urination, only urinates 1x/day. Dialysis MWF, compliant, completed full treatment yesterday. States he vomited his coffee this AM, no N/V otherwise. Took '500mg'$  tylenol approx 1130

## 2021-06-19 NOTE — ED Notes (Signed)
Pt was vomiting blue colored emesis into a styrofoam cup.  HE was provided an emesis bag and given zofran.  He and his wife are concerned that the color of emesis matches the color of the anti-rejection meds they say he was just given.

## 2021-06-19 NOTE — ED Notes (Signed)
Dr. Luna Fuse made aware of pt emesis & pt concerns.

## 2021-06-19 NOTE — ED Provider Notes (Signed)
[facility] EMERGENCY DEPARTMENT Provider Note   CSN: [id number] Arrival date & time: 06/19/21  1704     History Chief Complaint  Patient presents with   Fever    Kjon Germani is a 65 y.o. male.   Fever Associated symptoms: no chest pain, no chills, no cough, no dysuria, no ear pain, no rash, no sore throat and no vomiting    65 year old male with a past medical history of heart transplant at [facility] on active immunosuppression, ESRD on HD presenting to the emergency department with a fever.  Patient reports that he was in her normal state of health yesterday.  Today, the patient was more fatigued and started to feel warm.  He checked his temperature and it was nearly 103 F.  Patient reports he had a single episode of nausea and vomiting following coffee, but denies any nausea or abdominal pain currently.  He states that he did urinate last night and it burns when he pees, but he only urinates at most once a day and will not be able to provide [place] a urine sample he states.  He denies a fever or cough.  He does note that he seemed more short of breath today.  He called his transplant team and was told to present here for further evaluation.  No past medical history on file.  Patient Active Problem List   Diagnosis Date Noted   Sepsis ([place]) 06/19/2021   Immunocompromised patient ([place]) 06/19/2021   Sinus tachycardia 06/19/2021   Fever 02/04/2021   Acute respiratory failure with hypoxia ([place]) 12/15/2020   HLD (hyperlipidemia) 09/20/2020   ESRD (end stage renal disease) ([place]) 08/21/2020   Status post heart transplantation ([place]) 02/28/2020   Essential hypertension 04/10/2019    No past surgical history on file.     No family history on file.  Social History   Tobacco Use   Smoking status: Never   Smokeless tobacco: Never  Substance Use Topics   Alcohol use: Never   Drug use: Never    Home Medications Prior to Admission medications   Medication Sig Start  Date End Date Taking? Authorizing Provider  acetaminophen (TYLENOL) 500 MG tablet Take 500 mg by mouth every 6 (six) hours as needed for headache (pain).   Yes [provider]  aspirin 81 MG EC tablet Take 81 mg by mouth every morning. 08/27/20 08/27/21 Yes [provider]  atorvastatin (LIPITOR) 20 MG tablet Take 20 mg by mouth every morning. 09/10/20  Yes [provider]  calcium carbonate (OSCAL) 1500 (600 Ca) MG TABS tablet Take 1,500 mg by mouth every morning. 08/18/20  Yes [provider]  clopidogrel (PLAVIX) 75 MG tablet Take 75 mg by mouth every morning. 05/20/21  Yes [provider]  diltiazem (CARDIZEM CD) 180 MG 24 hr capsule Take 180 mg by mouth every evening. 05/27/21  Yes [provider]  midodrine (PROAMATINE) 5 MG tablet Take 5 mg by mouth See admin instructions. Take one tablet (5 mg) by mouth on Monday, Wednesday, Friday during dialysis 09/08/20  Yes [provider]  mirtazapine (REMERON) 45 MG tablet Take 45 mg by mouth every evening. 05/18/21  Yes [provider]  mycophenolate (MYFORTIC) 180 MG EC tablet Take 180 mg by mouth 2 (two) times daily. 06/07/21  Yes [provider]  pantoprazole (PROTONIX) 40 MG tablet Take 40 mg by mouth every morning. 09/02/20  Yes [provider]  tacrolimus (PROGRAF) 1 MG capsule Take 1 mg by  mouth 2 (two) times daily. Take with a 5 mg capsule for a total dose of 6 mg twice daily 09/04/20  Yes [provider]  tacrolimus (PROGRAF) 5 MG capsule Take 5 mg by mouth 2 (two) times daily. Take with a 1 mg capsule for a total dose of 6 mg twice daily 05/10/21  Yes [provider]  torsemide (DEMADEX) 100 MG tablet Take 100 mg by mouth See admin instructions. Take one tablet (100 mg) by mouth on Sunday, Tuesday, Thursday, Saturday mornings (non-dialysis days) 05/28/21  Yes [provider]    Allergies    Scopolamine and Lorazepam  Review of Systems    Review of Systems  Constitutional:  Positive for fatigue and fever. Negative for chills.  HENT:  Negative for ear pain and sore throat.   Eyes:  Negative for pain and visual disturbance.  Respiratory:  Positive for shortness of breath. Negative for cough.   Cardiovascular:  Negative for chest pain and palpitations.  Gastrointestinal:  Negative for abdominal pain and vomiting.  Genitourinary:  Negative for dysuria and hematuria.  Musculoskeletal:  Negative for arthralgias and back pain.  Skin:  Negative for color change and rash.  Neurological:  Negative for seizures and syncope.  All other systems reviewed and are negative.  Physical Exam Updated Vital Signs BP (!) 155/75   Pulse (!) 124   Temp 98.8 F (37.1 C) (Oral)   Resp (!) 25   SpO2 99%   Physical Exam Vitals and nursing note reviewed.  Constitutional:      General: He is not in acute distress.    Appearance: Normal appearance. He is well-developed and normal weight. He is ill-appearing. He is not toxic-appearing.  HENT:     Head: Normocephalic and atraumatic.  Eyes:     Conjunctiva/sclera: Conjunctivae normal.  Cardiovascular:     Rate and Rhythm: Regular rhythm. Tachycardia present.     Heart sounds: No murmur heard. Pulmonary:     Effort: No respiratory distress.     Breath sounds: Normal breath sounds. No stridor. No wheezing or rhonchi.     Comments: Tachypneic, on 2 L nasal cannula Abdominal:     Palpations: Abdomen is soft.     Tenderness: There is no abdominal tenderness.  Musculoskeletal:     Cervical back: Neck supple.  Skin:    General: Skin is warm and dry.  Neurological:     Mental Status: He is alert.    ED Results / Procedures / Treatments   Labs (all labs ordered are listed, but only abnormal results are displayed) Labs Reviewed  CBC WITH DIFFERENTIAL/PLATELET - Abnormal; Notable for the following components:      Result Value   RBC 2.95 (*)    Hemoglobin 8.7 (*)    HCT 30.0 (*)     MCV 101.7 (*)    MCHC 29.0 (*)    Lymphs Abs 0.5 (*)    Monocytes Absolute 1.1 (*)    Abs Immature Granulocytes 0.88 (*)    All other components within normal limits  COMPREHENSIVE METABOLIC PANEL - Abnormal; Notable for the following components:   Chloride 97 (*)    Glucose, Bld 101 (*)    BUN 24 (*)    Creatinine, Ser 5.76 (*)    Calcium 8.8 (*)    GFR, Estimated 10 (*)    All other components within normal limits  LIPASE, BLOOD - Abnormal; Notable for the following components:   Lipase 69 (*)  All other components within normal limits  BRAIN NATRIURETIC PEPTIDE - Abnormal; Notable for the following components:   B Natriuretic Peptide 287.5 (*)    All other components within normal limits  TROPONIN I (HIGH SENSITIVITY) - Abnormal; Notable for the following components:   Troponin I (High Sensitivity) 19 (*)    All other components within normal limits  TROPONIN I (HIGH SENSITIVITY) - Abnormal; Notable for the following components:   Troponin I (High Sensitivity) 20 (*)    All other components within normal limits  RESP PANEL BY RT-PCR (FLU A&B, COVID) ARPGX2  CULTURE, BLOOD (ROUTINE X 2)  CULTURE, BLOOD (ROUTINE X 2)  EXPECTORATED SPUTUM ASSESSMENT W GRAM STAIN, RFLX TO RESP C  LACTIC ACID, PLASMA  LACTIC ACID, PLASMA  URINALYSIS, ROUTINE W REFLEX MICROSCOPIC  TACROLIMUS LEVEL  MYCOPHENOLIC ACID (CELLCEPT)  HIV ANTIBODY (ROUTINE TESTING W REFLEX)  BASIC METABOLIC PANEL  CBC  MAGNESIUM  PHOSPHORUS  TACROLIMUS LEVEL    EKG EKG Interpretation  Date/Time:  Saturday June 19 2021 17:30:24 EDT Ventricular Rate:  127 PR Interval:  144 QRS Duration: 92 QT Interval:  314 QTC Calculation: 456 R Axis:   96 Text Interpretation: Sinus tachycardia Rightward axis Incomplete right bundle branch block Borderline ECG When compared to prior, faster rate. No STEMI Confirmed by Antony Blackbird [phone]) on 06/19/2021 7:08:58 PM  Radiology CT Chest Wo Contrast  Result Date:  06/19/2021 CLINICAL DATA:  Evaluate for pneumonia. EXAM: CT CHEST WITHOUT CONTRAST TECHNIQUE: Multidetector CT imaging of the chest was performed following the standard protocol without IV contrast. COMPARISON:  Chest radiograph dated 06/19/2021. FINDINGS: Evaluation of this exam is limited in the absence of intravenous contrast. Cardiovascular: There is no cardiomegaly. Small pericardial effusion. Mild atherosclerotic calcification of thoracic aorta. No aneurysmal dilatation. The central pulmonary arteries are grossly unremarkable. Right IJ central venous line with tip in the region of the cavoatrial junction. Left IJ line with tip in the central SVC close to the cavoatrial junction. Mediastinum/Nodes: Mildly enlarged subcarinal lymph node measures 15 mm short axis. No mediastinal adenopathy. The esophagus is grossly unremarkable. Mediastinal fluid collection. There is diffuse mediastinal stranding and edema, likely postoperative. Lungs/Pleura: Moderate right pleural effusion with compressive atelectasis of the majority of the right lower lobe and right middle lobe and partial compression of the right upper lobe. Pneumonia is not excluded. Small left pleural effusion with partial compressive atelectasis of the left lower lobe. There is a 3.3 x 2.0 cm masslike subpleural consolidation in the lingula with adjacent surgical material. This may represent an area of postoperative change/scarring although a mass is not excluded. There is no pneumothorax. The central airways are patent. Upper Abdomen: Subcentimeter hepatic hypodense lesions are not characterized. Postsurgical changes of stomach. Musculoskeletal: Osteopenia with degenerative changes of the spine. Age indeterminate mild compression fracture of superior endplates of 624THL and 624THL. Correlation with clinical exam and point tenderness recommended. Median sternotomy wires. IMPRESSION: 1. Moderate right pleural effusion with compressive atelectasis of the majority  of the right lower lobe and right middle lobe and partial compression of the right upper lobe. Pneumonia is not excluded. 2. Small left pleural effusion with partial compressive atelectasis of the left lower lobe. 3. Masslike subpleural consolidation in the lingula with adjacent surgical material may represent an area of postoperative change/scarring although a mass is not excluded. 4. Mildly enlarged subcarinal lymph node. 5. Age indeterminate mild compression fracture of superior endplates of 624THL and 624THL. Correlation with clinical exam and point tenderness recommended.  6. Aortic Atherosclerosis (ICD10-I70.0). Electronically Signed   By: Anner Crete M.D.   On: 06/19/2021 21:47   DG Chest Portable 1 View  Result Date: 06/19/2021 CLINICAL DATA:  Fever and vomiting. EXAM: PORTABLE CHEST 1 VIEW COMPARISON:  May 25, 2021 FINDINGS: There is stable bilateral venous catheter positioning. Multiple sternal wires are seen. Mild to moderate severity areas of atelectasis and/or infiltrate are again seen within the bilateral lower lobes. Small bilateral pleural effusions are noted. An air-fluid level is seen overlying the lower right lung. No pneumothorax is identified. The heart size and mediastinal contours are within normal limits. The visualized skeletal structures are unremarkable. IMPRESSION: 1. Mild to moderate severity bilateral lower lobe atelectasis and/or infiltrate. 2. Small bilateral pleural effusions. 3. Air-fluid level overlying the lower right lung which may represent the presence of an underlying cavitary lesion. Correlation with chest CT is recommended. Electronically Signed   By: Virgina Norfolk M.D.   On: 06/19/2021 19:48    Procedures Procedures   Medications Ordered in ED Medications  aspirin EC tablet 81 mg (has no administration in time range)  diltiazem (CARDIZEM CD) 24 hr capsule 180 mg (has no administration in time range)  atorvastatin (LIPITOR) tablet 20 mg (has no  administration in time range)  mirtazapine (REMERON) tablet 45 mg (has no administration in time range)  pantoprazole (PROTONIX) EC tablet 40 mg (has no administration in time range)  mycophenolate (MYFORTIC) EC tablet 180 mg (has no administration in time range)  clopidogrel (PLAVIX) tablet 75 mg (has no administration in time range)  calcium carbonate (OS-CAL - dosed in mg of elemental calcium) tablet 1,250 mg (has no administration in time range)  acetaminophen (TYLENOL) tablet 650 mg (has no administration in time range)    Or  acetaminophen (TYLENOL) suppository 650 mg (has no administration in time range)  levalbuterol (XOPENEX) nebulizer solution 0.63 mg (has no administration in time range)  ondansetron (ZOFRAN) tablet 4 mg (has no administration in time range)    Or  ondansetron (ZOFRAN) injection 4 mg (has no administration in time range)  tacrolimus (PROGRAF) capsule 6 mg (6 mg Oral Given 06/19/21 2153)  piperacillin-tazobactam (ZOSYN) IVPB 2.25 g (2.25 g Intravenous New Bag/Given 06/19/21 2218)  vancomycin variable dose per unstable renal function (pharmacist dosing) (has no administration in time range)  lactated ringers bolus 500 mL (0 mLs Intravenous Stopped 06/19/21 2035)  vancomycin (VANCOREADY) IVPB 1500 mg/300 mL (0 mg Intravenous Stopped 06/19/21 2214)    ED Course  I have reviewed the triage vital signs and the nursing notes.  Pertinent labs & imaging results that were available during my care of the patient were reviewed by me and considered in my medical decision making (see chart for details).    MDM Rules/Calculators/A&P                           65 year old male with a past medical history of a heart transplant, ESRD on HD presenting to the emergency department with a fever.  Vital signs reviewed, he is tachycardic 125-130 here.  He is also markedly tachypneic, between 25 and 30 times a minute.  Patient reports that his heart rate is typically between 105 110, and  his heart rate is abnormal for him.  Patient reports some dysuria, but essentially makes very little urine.  We will obtain a urine sample from if we can.  Overall, given his tachypnea, I am concerned that his fever most  likely localizes to the pulmonary system.  We will provide a small amount of fluids, obtain chest x-ray, CBC, BMP, lactic, cultures, COVID and flu testing.  Will administer broad-spectrum antibiotics given his immunocompromise status.  No leukocytosis, although this is likely an unreliable marker in this patient.  Chest x-ray is concerning for either dense atelectasis in the right lower lobe or pneumonia.  Given his constellations of symptoms, I am concerned this reflects an infectious process.  Troponin appears to be around baseline, overall stable at 19 and then 20.  No lactic acidosis.  BNP of 287, overall improved from several months ago when it was 480.  Metabolic panel within acceptable limits given his ESRD status.  Overall, the patient likely has an infection in the setting of significant immunocompromise, most likely localizing to the pulmonary source.  I spoke with the patient's heart transplant coordinator over at [facility].  They recommended transfer for possible.  I called the [facility] transfer line, and since his transplant was over a year ago he would be a medicine admission and they are not currently excepting medicine transfers.  Therefore patient was admitted to the inpatient team here, handoff given to the admitting team.    Final Clinical Impression(s) / ED Diagnoses Final diagnoses:  Community acquired pneumonia of right lower lobe of lung   Rx / DC Orders ED Discharge Orders     None        Claud Kelp, MD 06/19/21 2227    Tegeler, Gwenyth Allegra, MD 06/21/21 1148

## 2021-06-19 NOTE — ED Notes (Addendum)
ED Recliner provided for pt's visitor in room.

## 2021-06-19 NOTE — ED Notes (Signed)
RN spoke with ED pharmacy per pt needing medication reconciliation for his home anti-rejection medications.

## 2021-06-19 NOTE — ED Provider Notes (Signed)
Emergency Medicine Provider Triage Evaluation Note  Rickey Erickson , a 65 y.o. male  was evaluated in triage.  Pt complains of fever, 102 at home. Hx of heart transplant. NBNB emesis earlier today. Some dysuria. Increased SOB. Dialysis MWF, urinates once daily. Followed by [facility] for transplant. Taking meds as prescribed. Has Prn oxygen at home, typically does not have to use however has needed to use 2 L [place] today. On ASA and plavix  Review of Systems  Positive: Fever, SOB, dysuria Negative: Abd pain, back pain, numbness  Physical Exam  BP 130/82 (BP Location: Left Arm)   Pulse (!) 128   Temp 98.8 F (37.1 C) (Oral)   Resp (!) 23   SpO2 93%  Gen:   Awake, no distress   Resp:  Normal effort  MSK:   Moves extremities without difficulty  Other:    Medical Decision Making  Medically screening exam initiated at 5:22 PM.  Appropriate orders placed.  Rickey Erickson was informed that the remainder of the evaluation will be completed by another provider, this initial triage assessment does not replace that evaluation, and the importance of remaining in the ED until their evaluation is complete.  Fever, Hx of heart transplant, Sob, emesis dysuria  Needs room in back. Nursing aware   Nettie Elm, PA-C 06/19/21 1724    Wyvonnia Dusky, MD 06/19/21 [phone]

## 2021-06-20 ENCOUNTER — Encounter (HOSPITAL_COMMUNITY): Payer: Self-pay | Admitting: Internal Medicine

## 2021-06-20 ENCOUNTER — Other Ambulatory Visit: Payer: Self-pay

## 2021-06-20 LAB — URINALYSIS, ROUTINE W REFLEX MICROSCOPIC
Bacteria, UA: NONE SEEN
Bilirubin Urine: NEGATIVE
Glucose, UA: 50 mg/dL — AB
Ketones, ur: 5 mg/dL — AB
Nitrite: NEGATIVE
Protein, ur: 100 mg/dL — AB
RBC / HPF: >50 RBC/hpf — ABNORMAL HIGH (ref 0–5)
Specific Gravity, Urine: 1.017 (ref 1.005–1.030)
WBC, UA: >50 WBC/hpf — ABNORMAL HIGH (ref 0–5)
pH: 6 (ref 5.0–8.0)

## 2021-06-20 LAB — CBC
HCT: 27.3 % — ABNORMAL LOW (ref 39.0–52.0)
Hemoglobin: 8.2 g/dL — ABNORMAL LOW (ref 13.0–17.0)
MCH: 30.4 pg (ref 26.0–34.0)
MCHC: 30 g/dL (ref 30.0–36.0)
MCV: 101.1 fL — ABNORMAL HIGH (ref 80.0–100.0)
Platelets: 142 10*3/uL — ABNORMAL LOW (ref 150–400)
RBC: 2.7 MIL/uL — ABNORMAL LOW (ref 4.22–5.81)
RDW: 15.1 % (ref 11.5–15.5)
WBC: 10.3 10*3/uL (ref 4.0–10.5)
nRBC: 0 % (ref 0.0–0.2)

## 2021-06-20 LAB — BASIC METABOLIC PANEL
Anion gap: 15 (ref 5–15)
BUN: 31 mg/dL — ABNORMAL HIGH (ref 8–23)
CO2: 26 mmol/L (ref 22–32)
Calcium: 8.6 mg/dL — ABNORMAL LOW (ref 8.9–10.3)
Chloride: 96 mmol/L — ABNORMAL LOW (ref 98–111)
Creatinine, Ser: 6.4 mg/dL — ABNORMAL HIGH (ref 0.61–1.24)
GFR, Estimated: 9 mL/min — ABNORMAL LOW (ref >60)
Glucose, Bld: 91 mg/dL (ref 70–99)
Potassium: 4.5 mmol/L (ref 3.5–5.1)
Sodium: 137 mmol/L (ref 135–145)

## 2021-06-20 LAB — PHOSPHORUS: Phosphorus: 4.5 mg/dL (ref 2.5–4.6)

## 2021-06-20 LAB — D-DIMER, QUANTITATIVE: D-Dimer, Quant: 3.71 ug/mL-FEU — ABNORMAL HIGH (ref 0.00–0.50)

## 2021-06-20 LAB — MAGNESIUM: Magnesium: 1.8 mg/dL (ref 1.7–2.4)

## 2021-06-20 LAB — HIV ANTIBODY (ROUTINE TESTING W REFLEX): HIV Screen 4th Generation wRfx: NONREACTIVE

## 2021-06-20 MED ORDER — CALCITRIOL 0.25 MCG PO CAPS
0.7500 ug | ORAL_CAPSULE | ORAL | Status: DC
  Administered 2021-06-21 – 2021-06-25 (×3): 0.75 ug via ORAL
  Filled 2021-06-20 (×3): qty 3
  Filled 2021-06-20: qty 1

## 2021-06-20 MED ORDER — CHLORHEXIDINE GLUCONATE CLOTH 2 % EX PADS
6.0000 | MEDICATED_PAD | Freq: Every day | CUTANEOUS | Status: DC
Start: 2021-06-21 — End: 2021-06-24
  Administered 2021-06-20 – 2021-06-22 (×3): 6 via TOPICAL

## 2021-06-20 MED ORDER — DARBEPOETIN ALFA 60 MCG/0.3ML IJ SOSY
60.0000 ug | PREFILLED_SYRINGE | INTRAMUSCULAR | Status: DC
  Administered 2021-06-23: 60 ug via INTRAVENOUS
  Filled 2021-06-20 (×2): qty 0.3

## 2021-06-20 MED ORDER — VANCOMYCIN HCL IN DEXTROSE 750-5 MG/150ML-% IV SOLN
750.0000 mg | INTRAVENOUS | Status: DC
  Administered 2021-06-21: 750 mg via INTRAVENOUS
  Filled 2021-06-20 (×3): qty 150

## 2021-06-20 MED ORDER — SODIUM CHLORIDE 0.9 % IV BOLUS
250.0000 mL | Freq: Once | INTRAVENOUS | Status: AC
  Administered 2021-06-20: 250 mL via INTRAVENOUS

## 2021-06-20 MED ORDER — SODIUM CHLORIDE 0.9 % IV BOLUS
500.0000 mL | Freq: Once | INTRAVENOUS | Status: AC
  Administered 2021-06-20: 500 mL via INTRAVENOUS

## 2021-06-20 NOTE — Progress Notes (Addendum)
Rickey Erickson  [id number] DOB: 12-10-1955 DOA: 06/19/2021 PCP: Rolene Course, MD    Brief Narrative:  65 year old who underwent heart transplantation at [facility] in May [id number] complicated by prolonged respiratory failure requiring tracheostomy (since decannulated), lung/diaphragm injury, multi microbial PNA, PEG tube complicated by perforation requiring exploratory laparotomy x2 with subsequent GJ placement, and ESRD who presented to the ED 8/27 with generalized malaise, progressive hypoxia, and fever to 102.  In the ED he was found to have a heart rate of 128 with respiratory rate of 23 and saturation 93% on nasal cannula support.  Potassium was 4.3.  CXR demonstrated moderate bilateral lower lobe atelectasis versus infiltrates with an air-fluid level in the right LL possibly representing a cavitary lesion.  The EDP contacted [facility] who in principal agreed to accept the patient but was unable to do so due to no beds being available.  Significant Events:  8/27 admit via ED  Consultants:  None  Code Status: FULL CODE  Antimicrobials:  Zosyn 8/27 > Vancomycin 8/27 >  DVT prophylaxis: SCDs  Subjective: Tachycardic in the 120 range.  Patient tells me his baseline heart rate is usually 100-110.  Blood pressure stable.  Saturations 97% on room air.  Patient is stoic.  Reports chills/subjective fever but otherwise denies any complaints.  Specifically denies chest pain shortness of breath nausea vomiting or abdominal pain.  No lower extremity cramps or discomfort.  Assessment & Plan:  Sepsis POA in immunocompromised patient - possible RLL PNA Fever 102 with tachycardia at presentation with suspected bacterial infection meeting criteria for sepsis - continue broad-spectrum antibiotics  Moderate right pleural effusion - compressive atelectasis of RLL and RML - partial compression of the right upper lobe - suspected RLL Pneumonia  F/u CXR in AM - may require thoracentesis for diagnostic and therapeutic  purposes - continue broad empiric abx for suspected pnuemonia   Persistent sinus tachycardia  Likey simply due to acute infection and fever - no chest pain - d-dimer elevated, but this may simply be due to infection - check venous duplex B LE - low risk as no other sx to suggest PE therefore will not check CTa chest yet or begin full dose anticoag  Status post heart transplant May 2021 Continue immunosuppressive medications -unable to be transferred to his transplant center at present due to lack of beds  Acute on chronic hypoxic respiratory failure Typically requires O2 support at night only -oxygen saturations stable at present with only minimal support  ESRD - HD M/W/F Alert renal team to patient's admission and need for dialysis tomorrow  HTN Continue usual diltiazem   Family Communication: Spoke with sister at bedside at length Status is: Inpatient  Remains inpatient appropriate because:Inpatient level of care appropriate due to severity of illness  Dispo: The patient is from: Home              Anticipated d/c is to: Home              Patient currently is not medically stable to d/c.   Difficult to place patient No   Objective: Blood pressure (!) 137/59, pulse (!) 117, temperature 98.9 F (37.2 C), temperature source Oral, resp. rate (!) 24, SpO2 100 %.  Intake/Output Summary (Last 24 hours) at 06/20/2021 [id number] Last data filed at 06/20/2021 0725 Gross per 24 hour  Intake 1040 ml  Output --  Net 1040 ml   There were no vitals filed for this visit.  Examination: General: No acute respiratory distress -  having chills at time of my visit Lungs: Diminished breath sounds in right base with crackles in right mid lung fields and good air movement throughout other fields with no wheezing Cardiovascular: Tachycardic but regular without murmur or rub Abdomen: Nontender, nondistended, soft, bowel sounds positive, no rebound, no ascites, no appreciable mass Extremities: No  significant cyanosis, clubbing, or edema bilateral lower extremities  CBC: Recent Labs  Lab 06/19/21 1741 06/20/21 0325  WBC 9.3 10.3  NEUTROABS 6.9  --   HGB 8.7* 8.2*  HCT 30.0* 27.3*  MCV 101.7* 101.1*  PLT 175 [id number]*   Basic Metabolic Panel: Recent Labs  Lab 06/19/21 1741 06/20/21 0325  NA 139 137  K 4.3 4.5  CL 97* 96*  CO2 30 26  GLUCOSE 101* 91  BUN 24* 31*  CREATININE 5.76* 6.40*  CALCIUM 8.8* 8.6*  MG  --  1.8  PHOS  --  4.5   GFR: CrCl cannot be calculated (Unknown ideal weight.).  Liver Function Tests: Recent Labs  Lab 06/19/21 1741  AST 16  ALT 10  ALKPHOS 75  BILITOT 0.8  PROT 6.6  ALBUMIN 3.6   Recent Labs  Lab 06/19/21 1741  LIPASE 69*    Recent Results (from the past 240 hour(s))  Resp Panel by RT-PCR (Flu A&B, Covid) Nasopharyngeal Swab     Status: None   Collection Time: 06/19/21  5:34 PM   Specimen: Nasopharyngeal Swab; Nasopharyngeal(NP) swabs in vial transport medium  Result Value Ref Range Status   SARS Coronavirus 2 by RT PCR NEGATIVE NEGATIVE Final    Comment: (NOTE) SARS-CoV-2 target nucleic acids are NOT DETECTED.  The SARS-CoV-2 RNA is generally detectable in upper respiratory specimens during the acute phase of infection. The lowest concentration of SARS-CoV-2 viral copies this assay can detect is 138 copies/mL. A negative result does not preclude SARS-Cov-2 infection and should not be used as the sole basis for treatment or other patient management decisions. A negative result may occur with  improper specimen collection/handling, submission of specimen other than nasopharyngeal swab, presence of viral mutation(s) within the areas targeted by this assay, and inadequate number of viral copies(<138 copies/mL). A negative result must be combined with clinical observations, patient history, and epidemiological information. The expected result is Negative.  Fact Sheet for Patients:   EntrepreneurPulse.com.au  Fact Sheet for Healthcare Providers:  IncredibleEmployment.be  This test is no t yet approved or cleared by the [place] FDA and  has been authorized for detection and/or diagnosis of SARS-CoV-2 by FDA under an Emergency Use Authorization (EUA). This EUA will remain  in effect (meaning this test can be used) for the duration of the COVID-19 declaration under Section 564(b)(1) of the Act, 21 U.S.C.section 360bbb-3(b)(1), unless the authorization is terminated  or revoked sooner.       Influenza A by PCR NEGATIVE NEGATIVE Final   Influenza B by PCR NEGATIVE NEGATIVE Final    Comment: (NOTE) The Xpert Xpress SARS-CoV-2/FLU/RSV plus assay is intended as an aid in the diagnosis of influenza from Nasopharyngeal swab specimens and should not be used as a sole basis for treatment. Nasal washings and aspirates are unacceptable for Xpert Xpress SARS-CoV-2/FLU/RSV testing.  Fact Sheet for Patients: EntrepreneurPulse.com.au  Fact Sheet for Healthcare Providers: IncredibleEmployment.be  This test is not yet approved or cleared by the [place] FDA and has been authorized for detection and/or diagnosis of SARS-CoV-2 by FDA under an Emergency Use Authorization (EUA). This EUA will remain in effect (meaning this test  can be used) for the duration of the COVID-19 declaration under Section 564(b)(1) of the Act, 21 U.S.C. section 360bbb-3(b)(1), unless the authorization is terminated or revoked.  Performed at [facility], [place] [address], [place], [place] [address]      Scheduled Meds:  aspirin EC  81 mg Oral q morning   atorvastatin  20 mg Oral q morning   calcium carbonate  1,250 mg Oral QPC breakfast   clopidogrel  75 mg Oral q morning   diltiazem  180 mg Oral QPM   mirtazapine  45 mg Oral QPM   mycophenolate  180 mg Oral BID   pantoprazole  40 mg Oral q morning    tacrolimus  6 mg Oral BID   vancomycin variable dose per unstable renal function (pharmacist dosing)   Does not apply See admin instructions   Continuous Infusions:  piperacillin-tazobactam (ZOSYN)  IV Stopped (06/20/21 0641)     LOS: 1 day   Cherene Altes, MD [facility] Office  [phone] Pager - Text Page per Shea Evans  If 7PM-7AM, please contact night-coverage per Amion 06/20/2021, 8:32 AM

## 2021-06-20 NOTE — ED Notes (Signed)
Pt bowel incontinent in his disposable brief from home. Cleaned and changed by this RN and Orene Desanctis, NT.

## 2021-06-20 NOTE — Consult Note (Addendum)
Renal Service Consult Note [facility]  Damin Eberle 06/20/2021 Sol Blazing, MD Requesting Physician: Dr. Thereasa Solo  Reason for Consult: ESRD pt w/ heart transplant and high fevers HPI: The patient is a 65 y.o. year-old w/ hx of syst CHF sp heart transplant in May 2021 (admit w/ RF, AKI HD dependent, multimicrobial PAN, lung/diaphragm injury, trach and PEG, PEG perforation sp exlap w/ GJ placement). F/b transplant team at [facility].         ROS - denies CP, no joint pain, no HA, no blurry vision, no rash, no diarrhea, no nausea/ vomiting, no dysuria, no difficulty voiding   Past Medical History No past medical history on file. Past Surgical History No past surgical history on file. Family History No family history on file. Social History  reports that he has never smoked. He has never used smokeless tobacco. He reports that he does not drink alcohol and does not use drugs. Allergies  Allergies  Allergen Reactions   Scopolamine Other (See Comments)     Hallucinations Induces severe delirium    Lorazepam Other (See Comments)    Hallucinations Severe somnolence/delirium    Home medications Prior to Admission medications   Medication Sig Start Date End Date Taking? Authorizing Provider  acetaminophen (TYLENOL) 500 MG tablet Take 500 mg by mouth every 6 (six) hours as needed for headache (pain).   Yes [provider]  aspirin 81 MG EC tablet Take 81 mg by mouth every morning. 08/27/20 08/27/21 Yes [provider]  atorvastatin (LIPITOR) 20 MG tablet Take 20 mg by mouth every morning. 09/10/20  Yes [provider]  calcium carbonate (OSCAL) 1500 (600 Ca) MG TABS tablet Take 1,500 mg by mouth every morning. 08/18/20  Yes [provider]  clopidogrel (PLAVIX) 75 MG tablet Take 75 mg by mouth every morning. 05/20/21  Yes [provider]  diltiazem (CARDIZEM CD) 180 MG 24 hr capsule Take 180 mg by mouth every evening. 05/27/21  Yes  [provider]  midodrine (PROAMATINE) 5 MG tablet Take 5 mg by mouth See admin instructions. Take one tablet (5 mg) by mouth on Monday, Wednesday, Friday during dialysis 09/08/20  Yes [provider]  mirtazapine (REMERON) 45 MG tablet Take 45 mg by mouth every evening. 05/18/21  Yes [provider]  mycophenolate (MYFORTIC) 180 MG EC tablet Take 180 mg by mouth 2 (two) times daily. 06/07/21  Yes [provider]  pantoprazole (PROTONIX) 40 MG tablet Take 40 mg by mouth every morning. 09/02/20  Yes [provider]  tacrolimus (PROGRAF) 1 MG capsule Take 1 mg by mouth 2 (two) times daily. Take with a 5 mg capsule for a total dose of 6 mg twice daily 09/04/20  Yes [provider]  tacrolimus (PROGRAF) 5 MG capsule Take 5 mg by mouth 2 (two) times daily. Take with a 1 mg capsule for a total dose of 6 mg twice daily 05/10/21  Yes [provider]  torsemide (DEMADEX) 100 MG tablet Take 100 mg by mouth See admin instructions. Take one tablet (100 mg) by mouth on Sunday, Tuesday, Thursday, Saturday mornings (non-dialysis days) 05/28/21  Yes [provider]     Vitals:   06/20/21 0900 06/20/21 0930 06/20/21 1000 06/20/21 1100  BP: 129/70 121/74 138/73 (!) 160/58  Pulse: (!) 118 (!) 117 (!) 118 (!) 117  Resp: (!) 22 20 (!) 26 (!) 33  Temp:      TempSrc:      SpO2: 98%  98% 99% 100%   Exam Gen alert, no distress, somewhat frail No rash, cyanosis or gangrene Sclera anicteric, throat clear  No jvd or bruits Chest clear bilat to bases, no rales/ wheezing RRR no MRG Abd soft ntnd no mass or ascites +bs GU normal male MS no joint effusions or deformity Ext no LE or UE edema, no wounds or ulcers Neuro is alert, Ox 3 , nf, gen weakness  RIJ TDC/ RUA HeRO AVG+bruit     Home meds include asa, lipitor, plavix , cardizem cd 180, midodrine 5 mg pre HD mwf, remeron, myfortic 180 bid, prograf '6mg'$  bid, protonix, demadex '100mg'$  qd on non HD  days    Na 137  K 4.5  CO2 26  BUN 31  cr 6.40  Ca 8.6  Hb 8.2  WBC 10   phos 4.5   CT chest noncon - IMPRESSION: 1. Moderate right pleural effusion with compressive atelectasis of the majority of the right lower lobe and right middle lobe and partial compression of the right upper lobe. Pneumonia is not excluded. Small left pleural effusion with partial compressive atelectasis of the left lower lobe. Masslike subpleural consolidation in the lingula with adjacent surgical material may represent an area of postoperative change/scarring although a mass is not excluded.   OP HD: [facility] MWF   4h  400/500   67kg  2/2 bath  TDC  /RU HeRO (not sure when ready to use)  Hep 2000  - mircera 75 q2, last 8/17  - calcitriol 0.75 po tiw      BP 120- 160/ 60- 75   HR 126 >>  117- 120, RR 18- 32  temp 99.6  Assessment/ Plan: Fever/ sepsis - in immunocomprised pt, per pmd. Suspected lung source, on IV vanc and zosyn. Sig consolidation and/or effusion R lower/ middle lobe by CT.  Heart transplant - on prograf and cellcept, per pmd.  ESRD - on HD MWF. Has not missed. HeRO placed 05/15/21, ready to use 8/23. Plan HD tomorrow, stick AVG.  HTN /vol - BP's are wnl, cont home meds. No edema on exam, CXR / CT w/o edema. Small UF 1-2 L w/ next HD as tol.  A/C resp failure - on prn O2 at home, continuous here.  Anemia ckd - Hb 8's here, next esa due 8/31, have ordered MBD ckd - ca/ phos in range, cont vdra      Kelly Splinter  MD 06/20/2021, 11:52 AM  Recent Labs  Lab 06/19/21 1741 06/20/21 0325  WBC 9.3 10.3  HGB 8.7* 8.2*   Recent Labs  Lab 06/19/21 1741 06/20/21 0325  K 4.3 4.5  BUN 24* 31*  CREATININE 5.76* 6.40*  CALCIUM 8.8* 8.6*  PHOS  --  4.5

## 2021-06-20 NOTE — Progress Notes (Addendum)
Patient arrived from ED to 4e22 patient placed on bedside monitor, vitals obtained and CHG bath complete. CCMD made aware. Patient with healing surgical incision to chest. And right arm incision, warm to touch and slightly pink. Patient with HD cath to left upper chest. Otherwise skin intact On oxygen at 2L via [place]. Call bell with in reach. Aashi Derrington, Bettina Gavia RN

## 2021-06-20 NOTE — ED Notes (Signed)
Pt sleeping  Visitor at bedside  Drink given,  No complaints

## 2021-06-21 ENCOUNTER — Inpatient Hospital Stay (HOSPITAL_COMMUNITY): Payer: 59

## 2021-06-21 DIAGNOSIS — I82403 Acute embolism and thrombosis of unspecified deep veins of lower extremity, bilateral: Secondary | ICD-10-CM

## 2021-06-21 LAB — COMPREHENSIVE METABOLIC PANEL
ALT: 8 U/L (ref 0–44)
AST: 11 U/L — ABNORMAL LOW (ref 15–41)
Albumin: 2.9 g/dL — ABNORMAL LOW (ref 3.5–5.0)
Alkaline Phosphatase: 60 U/L (ref 38–126)
Anion gap: 16 — ABNORMAL HIGH (ref 5–15)
BUN: 45 mg/dL — ABNORMAL HIGH (ref 8–23)
CO2: 25 mmol/L (ref 22–32)
Calcium: 8.6 mg/dL — ABNORMAL LOW (ref 8.9–10.3)
Chloride: 95 mmol/L — ABNORMAL LOW (ref 98–111)
Creatinine, Ser: 8.34 mg/dL — ABNORMAL HIGH (ref 0.61–1.24)
GFR, Estimated: 7 mL/min — ABNORMAL LOW (ref >60)
Glucose, Bld: 100 mg/dL — ABNORMAL HIGH (ref 70–99)
Potassium: 4.6 mmol/L (ref 3.5–5.1)
Sodium: 136 mmol/L (ref 135–145)
Total Bilirubin: 0.9 mg/dL (ref 0.3–1.2)
Total Protein: 6.2 g/dL — ABNORMAL LOW (ref 6.5–8.1)

## 2021-06-21 LAB — IRON AND TIBC
Iron: 13 ug/dL — ABNORMAL LOW (ref 45–182)
Saturation Ratios: 9 % — ABNORMAL LOW (ref 17.9–39.5)
TIBC: 144 ug/dL — ABNORMAL LOW (ref 250–450)
UIBC: 131 ug/dL

## 2021-06-21 LAB — CBC
HCT: 26.5 % — ABNORMAL LOW (ref 39.0–52.0)
Hemoglobin: 7.6 g/dL — ABNORMAL LOW (ref 13.0–17.0)
MCH: 29 pg (ref 26.0–34.0)
MCHC: 28.7 g/dL — ABNORMAL LOW (ref 30.0–36.0)
MCV: 101.1 fL — ABNORMAL HIGH (ref 80.0–100.0)
Platelets: 134 10*3/uL — ABNORMAL LOW (ref 150–400)
RBC: 2.62 MIL/uL — ABNORMAL LOW (ref 4.22–5.81)
RDW: 14.9 % (ref 11.5–15.5)
WBC: 9.4 10*3/uL (ref 4.0–10.5)
nRBC: 0 % (ref 0.0–0.2)

## 2021-06-21 LAB — CORTISOL: Cortisol, Plasma: 19.6 ug/dL

## 2021-06-21 LAB — PROCALCITONIN: Procalcitonin: 5.37 ng/mL

## 2021-06-21 LAB — HEPATITIS B CORE ANTIBODY, TOTAL: Hep B Core Total Ab: NONREACTIVE

## 2021-06-21 LAB — HEPATITIS B SURFACE ANTIGEN: Hepatitis B Surface Ag: NONREACTIVE

## 2021-06-21 MED ORDER — MIDODRINE HCL 5 MG PO TABS
10.0000 mg | ORAL_TABLET | ORAL | Status: DC
  Administered 2021-06-21 – 2021-06-25 (×3): 10 mg via ORAL
  Filled 2021-06-21 (×2): qty 2

## 2021-06-21 MED ORDER — HEPARIN SODIUM (PORCINE) 1000 UNIT/ML DIALYSIS
2000.0000 [IU] | Freq: Once | INTRAMUSCULAR | Status: DC
  Filled 2021-06-21: qty 2

## 2021-06-21 MED ORDER — MIDODRINE HCL 5 MG PO TABS
ORAL_TABLET | ORAL | Status: AC
  Filled 2021-06-21: qty 10

## 2021-06-21 MED ORDER — SODIUM CHLORIDE 0.9 % IV SOLN
500.0000 mg | INTRAVENOUS | Status: DC
  Administered 2021-06-21 – 2021-06-23 (×3): 500 mg via INTRAVENOUS
  Filled 2021-06-21 (×5): qty 0.5

## 2021-06-21 NOTE — Progress Notes (Signed)
Pharmacy Antibiotic Note  Rickey Erickson is a 65 y.o. male admitted on 06/19/2021 with sepsis / possible RLL PNA.  Pharmacy has been consulted for vancomycin and meropenem dosing; previously on Zosyn.  Patient has a history of systolic CHF s/p LVAD, heart transplant [id number] complicated by prolonged RF requiring trach (now decannulated), lung/diaphragm injury, multi-microbial PNA, bacteremia, trach and PEG placement, PEG perforation s/p ex-lap x 2 with GJ placement, ESRD on HD (MWF). Patient presentied to ED with dyspnea and fever.  Tm 101, WBC are down to normal, in HD now. Plan to transfer to [facility] when bed available. ID consulted.  Plan: Discontinue Zosyn Begin meropenem 500 mg IV q24h  Vancomycin 750 mg IV qHD MWF Monitor cultures De-escalate abx when appropriate  Height: '5\' 8"'$  (172.7 cm) Weight: 68.6 kg (151 lb 3.8 oz) IBW/kg (Calculated) : 68.4  Temp (24hrs), Avg:98.6 F (37 C), Min:97.6 F (36.4 C), Max:101 F (38.3 C)  Recent Labs  Lab 06/19/21 1741 06/19/21 1914 06/20/21 0325 06/21/21 0214  WBC 9.3  --  10.3 9.4  CREATININE 5.76*  --  6.40* 8.34*  LATICACIDVEN 1.8 1.9  --   --      Estimated Creatinine Clearance: 8.8 mL/min (A) (by C-G formula based on SCr of 8.34 mg/dL (H)).    Allergies  Allergen Reactions   Scopolamine Other (See Comments)     Hallucinations Induces severe delirium    Lorazepam Other (See Comments)    Hallucinations Severe somnolence/delirium     Antimicrobials this admission: Zosyn 8/27 >> 8/29 Vancomycin 8/27 >>  Meropenem 8/29 >>   Microbiology results: 8/27 BCx2: ngtd  Thank you for involving pharmacy in this patient's care.  Renold Genta, PharmD, BCPS Clinical Pharmacist Clinical phone for 06/21/2021 until 10p is [phone] 06/21/2021 4:31 PM  **Pharmacist phone directory can be found on [email or web address] listed under [place]**

## 2021-06-21 NOTE — Progress Notes (Signed)
[facility] ROUNDING NOTE   Subjective:   Interval History: This is a 65 year old gentleman with a history of heart transplantation at [facility] May [id number] complicated by prolonged respiratory failure requiring tracheostomy PEG placement complicated by perforation and exploratory laparotomy x2.  He is also end-stage renal disease and presented with right pleural effusion and suspected right lower lobe pneumonia.  His next dialysis is planned for 06/21/2021  Blood pressure 107/59 pulse  115 temperature 98 O2 sats 98% 2.5 L nasal cannula  Sodium 136 potassium 4.6 chloride 95 CO2 25 BUN 45 creatinine 8.34 glucose 100 calcium 8.6 albumin 2.9 hemoglobin 7.6.   OP HD: NW MWF   4h  400/500   67kg  2/2 bath  TDC  /RU HeRO (not sure when ready to use)  Hep 2000  - mircera 75 q2, last 8/17  - calcitriol 0.75 po tiw   Objective:  Vital signs in last 24 hours:  Temp:  [97.6 F (36.4 C)-100.3 F (37.9 C)] 98 F (36.7 C) (08/29 0314) Pulse Rate:  [101-125] 114 (08/29 0314) Resp:  [19-33] 20 (08/29 0314) BP: (101-160)/(55-77) 118/61 (08/29 0314) SpO2:  [97 %-100 %] 100 % (08/29 0314) Weight:  [68.3 kg] 68.3 kg (08/29 0328)  Weight change:  Filed Weights   06/20/21 1417 06/21/21 0328  Weight: 68.3 kg 68.3 kg    Intake/Output: I/O last 3 completed shifts: In: 1412.3 [P.O.:480; IV Piggyback:932.3] Out: -    Intake/Output this shift:  No intake/output data recorded. Gen alert, no distress, somewhat frail No rash, cyanosis or gangrene Sclera anicteric, throat clear  No jvd or bruits Chest clear bilat to bases, no rales/ wheezing RRR no MRG Abd soft ntnd no mass or ascites +bs GU normal male MS no joint effusions or deformity Ext no LE or UE edema, no wounds or ulcers Neuro is alert, Ox 3 , nf, gen weakness  RIJ TDC/ RUA HeRO AVG+bruit   Basic Metabolic Panel: Recent Labs  Lab 06/19/21 1741 06/20/21 0325 06/21/21 0214  NA 139 137 136  K 4.3 4.5 4.6  CL 97* 96* 95*   CO2 '30 26 25  '$ GLUCOSE 101* 91 100*  BUN 24* 31* 45*  CREATININE 5.76* 6.40* 8.34*  CALCIUM 8.8* 8.6* 8.6*  MG  --  1.8  --   PHOS  --  4.5  --     Liver Function Tests: Recent Labs  Lab 06/19/21 1741 06/21/21 0214  AST 16 11*  ALT 10 8  ALKPHOS 75 60  BILITOT 0.8 0.9  PROT 6.6 6.2*  ALBUMIN 3.6 2.9*   Recent Labs  Lab 06/19/21 1741  LIPASE 69*   No results for input(s): AMMONIA in the last 168 hours.  CBC: Recent Labs  Lab 06/19/21 1741 06/20/21 0325 06/21/21 0214  WBC 9.3 10.3 9.4  NEUTROABS 6.9  --   --   HGB 8.7* 8.2* 7.6*  HCT 30.0* 27.3* 26.5*  MCV 101.7* 101.1* 101.1*  PLT 175 142* 134*    Cardiac Enzymes: No results for input(s): CKTOTAL, CKMB, CKMBINDEX, TROPONINI in the last 168 hours.  BNP: Invalid input(s): POCBNP  CBG: No results for input(s): GLUCAP in the last 168 hours.  Microbiology: Results for orders placed or performed during the hospital encounter of 06/19/21  Resp Panel by RT-PCR (Flu A&B, Covid) Nasopharyngeal Swab     Status: None   Collection Time: 06/19/21  5:34 PM   Specimen: Nasopharyngeal Swab; Nasopharyngeal(NP) swabs in vial transport medium  Result Value Ref Range  Status   SARS Coronavirus 2 by RT PCR NEGATIVE NEGATIVE Final    Comment: (NOTE) SARS-CoV-2 target nucleic acids are NOT DETECTED.  The SARS-CoV-2 RNA is generally detectable in upper respiratory specimens during the acute phase of infection. The lowest concentration of SARS-CoV-2 viral copies this assay can detect is 138 copies/mL. A negative result does not preclude SARS-Cov-2 infection and should not be used as the sole basis for treatment or other patient management decisions. A negative result may occur with  improper specimen collection/handling, submission of specimen other than nasopharyngeal swab, presence of viral mutation(s) within the areas targeted by this assay, and inadequate number of viral copies(<138 copies/mL). A negative result must be  combined with clinical observations, patient history, and epidemiological information. The expected result is Negative.  Fact Sheet for Patients:  EntrepreneurPulse.com.au  Fact Sheet for Healthcare Providers:  IncredibleEmployment.be  This test is no t yet approved or cleared by the [place] FDA and  has been authorized for detection and/or diagnosis of SARS-CoV-2 by FDA under an Emergency Use Authorization (EUA). This EUA will remain  in effect (meaning this test can be used) for the duration of the COVID-19 declaration under Section 564(b)(1) of the Act, 21 U.S.C.section 360bbb-3(b)(1), unless the authorization is terminated  or revoked sooner.       Influenza A by PCR NEGATIVE NEGATIVE Final   Influenza B by PCR NEGATIVE NEGATIVE Final    Comment: (NOTE) The Xpert Xpress SARS-CoV-2/FLU/RSV plus assay is intended as an aid in the diagnosis of influenza from Nasopharyngeal swab specimens and should not be used as a sole basis for treatment. Nasal washings and aspirates are unacceptable for Xpert Xpress SARS-CoV-2/FLU/RSV testing.  Fact Sheet for Patients: EntrepreneurPulse.com.au  Fact Sheet for Healthcare Providers: IncredibleEmployment.be  This test is not yet approved or cleared by the [place] FDA and has been authorized for detection and/or diagnosis of SARS-CoV-2 by FDA under an Emergency Use Authorization (EUA). This EUA will remain in effect (meaning this test can be used) for the duration of the COVID-19 declaration under Section 564(b)(1) of the Act, 21 U.S.C. section 360bbb-3(b)(1), unless the authorization is terminated or revoked.  Performed at [facility], [place] [address], [place], [place] [address]   Blood culture (routine x 2)     Status: None (Preliminary result)   Collection Time: 06/19/21  7:14 PM   Specimen: BLOOD LEFT HAND  Result Value Ref Range Status    Specimen Description BLOOD LEFT HAND  Final   Special Requests   Final    BOTTLES DRAWN AEROBIC AND ANAEROBIC Blood Culture results may not be optimal due to an inadequate volume of blood received in culture bottles   Culture   Final    NO GROWTH 2 DAYS Performed at [facility], [place] [address] [place], [place] [address]    Report Status PENDING  Incomplete    Coagulation Studies: No results for input(s): LABPROT, INR in the last 72 hours.  Urinalysis: Recent Labs    06/19/21 1721  COLORURINE YELLOW  LABSPEC 1.017  PHURINE 6.0  GLUCOSEU 50*  HGBUR MODERATE*  BILIRUBINUR NEGATIVE  KETONESUR 5*  PROTEINUR 100*  NITRITE NEGATIVE  LEUKOCYTESUR LARGE*      Imaging: CT Chest Wo Contrast  Result Date: 06/19/2021 CLINICAL DATA:  Evaluate for pneumonia. EXAM: CT CHEST WITHOUT CONTRAST TECHNIQUE: Multidetector CT imaging of the chest was performed following the standard protocol without IV contrast. COMPARISON:  Chest radiograph dated 06/19/2021. FINDINGS:  Evaluation of this exam is limited in the absence of intravenous contrast. Cardiovascular: There is no cardiomegaly. Small pericardial effusion. Mild atherosclerotic calcification of thoracic aorta. No aneurysmal dilatation. The central pulmonary arteries are grossly unremarkable. Right IJ central venous line with tip in the region of the cavoatrial junction. Left IJ line with tip in the central SVC close to the cavoatrial junction. Mediastinum/Nodes: Mildly enlarged subcarinal lymph node measures 15 mm short axis. No mediastinal adenopathy. The esophagus is grossly unremarkable. Mediastinal fluid collection. There is diffuse mediastinal stranding and edema, likely postoperative. Lungs/Pleura: Moderate right pleural effusion with compressive atelectasis of the majority of the right lower lobe and right middle lobe and partial compression of the right upper lobe. Pneumonia is not excluded. Small left pleural effusion with partial  compressive atelectasis of the left lower lobe. There is a 3.3 x 2.0 cm masslike subpleural consolidation in the lingula with adjacent surgical material. This may represent an area of postoperative change/scarring although a mass is not excluded. There is no pneumothorax. The central airways are patent. Upper Abdomen: Subcentimeter hepatic hypodense lesions are not characterized. Postsurgical changes of stomach. Musculoskeletal: Osteopenia with degenerative changes of the spine. Age indeterminate mild compression fracture of superior endplates of 624THL and 624THL. Correlation with clinical exam and point tenderness recommended. Median sternotomy wires. IMPRESSION: 1. Moderate right pleural effusion with compressive atelectasis of the majority of the right lower lobe and right middle lobe and partial compression of the right upper lobe. Pneumonia is not excluded. 2. Small left pleural effusion with partial compressive atelectasis of the left lower lobe. 3. Masslike subpleural consolidation in the lingula with adjacent surgical material may represent an area of postoperative change/scarring although a mass is not excluded. 4. Mildly enlarged subcarinal lymph node. 5. Age indeterminate mild compression fracture of superior endplates of 624THL and 624THL. Correlation with clinical exam and point tenderness recommended. 6. Aortic Atherosclerosis (ICD10-I70.0). Electronically Signed   By: Anner Crete M.D.   On: 06/19/2021 21:47   DG Chest Portable 1 View  Result Date: 06/19/2021 CLINICAL DATA:  Fever and vomiting. EXAM: PORTABLE CHEST 1 VIEW COMPARISON:  May 25, 2021 FINDINGS: There is stable bilateral venous catheter positioning. Multiple sternal wires are seen. Mild to moderate severity areas of atelectasis and/or infiltrate are again seen within the bilateral lower lobes. Small bilateral pleural effusions are noted. An air-fluid level is seen overlying the lower right lung. No pneumothorax is identified. The heart size  and mediastinal contours are within normal limits. The visualized skeletal structures are unremarkable. IMPRESSION: 1. Mild to moderate severity bilateral lower lobe atelectasis and/or infiltrate. 2. Small bilateral pleural effusions. 3. Air-fluid level overlying the lower right lung which may represent the presence of an underlying cavitary lesion. Correlation with chest CT is recommended. Electronically Signed   By: Virgina Norfolk M.D.   On: 06/19/2021 19:48     Medications:    piperacillin-tazobactam (ZOSYN)  IV 2.25 g (06/21/21 0548)   vancomycin      aspirin EC  81 mg Oral q morning   atorvastatin  20 mg Oral q morning   calcitRIOL  0.75 mcg Oral Q M,W,F-HD   calcium carbonate  1,250 mg Oral QPC breakfast   Chlorhexidine Gluconate Cloth  6 each Topical Q0600   clopidogrel  75 mg Oral q morning   [START ON 06/23/2021] darbepoetin (ARANESP) injection - DIALYSIS  60 mcg Intravenous Q Wed-HD   diltiazem  180 mg Oral QPM   mirtazapine  45 mg Oral  QPM   mycophenolate  180 mg Oral BID   pantoprazole  40 mg Oral q morning   tacrolimus  6 mg Oral BID   acetaminophen **OR** acetaminophen, levalbuterol, ondansetron **OR** ondansetron (ZOFRAN) IV  Assessment/ Plan:  Fever/ sepsis - in immunocomprised pt, per pmd. Suspected lung source, on IV vanc and zosyn. Sig consolidation and/or effusion R lower/ middle lobe by CT.  Heart transplant - on prograf and cellcept, per pmd.  ESRD - on HD MWF. Has not missed. HeRO placed 05/15/21, ready to use 8/23. Plan HD 06/21/2021 stick AVG.  HTN /vol - BP's are wnl, cont home meds. No edema on exam, CXR / CT w/o edema. Small UF 1-2 L w/ next HD as tol.  A/C resp failure - on prn O2 at home, continuous here.  Anemia ckd - Hb 8's here, next esa due 8/31, have ordered   noted fairly low iron saturations.  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

## 2021-06-21 NOTE — Progress Notes (Signed)
Rickey Erickson  [id number] DOB: [birth year] DOA: 06/19/2021 PCP: Rolene Course, MD    Brief Narrative:  65 year old who underwent heart transplantation at [facility] in May [id number] complicated by prolonged respiratory failure requiring tracheostomy (since decannulated), lung/diaphragm injury, multi microbial PNA, PEG tube complicated by perforation requiring exploratory laparotomy x2 with subsequent GJ placement, and ESRD who presented to the ED 8/27 with generalized malaise, progressive hypoxia, and fever to 102.  In the ED he was found to have a heart rate of 128 with respiratory rate of 23 and saturation 93% on nasal cannula support.  Potassium was 4.3.  CXR demonstrated moderate bilateral lower lobe atelectasis versus infiltrates with an air-fluid level in the right LL possibly representing a cavitary lesion.  The EDP contacted [facility] who in principal agreed to accept the patient but was unable to do so due to no beds being available.  I contacted [facility] on 8/29, discussed with Dr. Janith Lima, who has accepted in transfer to cardiology service (accepting MD Dr. Sherre Lain) when bed becomes available (no beds available at this time).  Should be okay to go to a telemetry bed.  Since unsure as to timing of bed availability, DC paperwork not completed at this time and will have to be completed when bed available.  PCCM consulted for right diagnostic/therapeutic thoracentesis, likely for 8/30.  ID consulted and will see tomorrow, recommended changing Zosyn to meropenem.  Significant Events:  8/27 admit via ED 8/29 ongoing fevers up to 101 F, blood cultures x2 negative to date, ID consulted.  Call placed to [facility]-not excepting outside transfers, contacting their transplant team.  Consultants:  Infectious disease.  Code Status: FULL CODE  Antimicrobials:  Zosyn 8/27 > Vancomycin 8/27 >  DVT prophylaxis: SCDs  Subjective: Reports feeling about 20% better.  Had fever of 101 F around noon time after my visit.   Reports feeling generally weak.  Cough with intermittent mild yellow sputum.  Some dyspnea.  Sister reports hypoxic at home on 0.5 L nightly oxygen with oxygen saturation sometimes in the 70s to low 80s and hence on 1 L/min.  Assessment & Plan:  Sepsis POA in immunocompromised patient - possible RLL PNA Fever 102 with tachycardia at presentation with suspected bacterial infection meeting criteria for sepsis - continue broad-spectrum antibiotics (Zosyn and vancomycin) Ongoing fevers, 101 F today.  Blood cultures x2: Negative to date.  Patient also has a [place] for HD which could be a source but blood cultures are negative thus far.  Moderate right pleural effusion - compressive atelectasis of RLL and RML - partial compression of the right upper lobe - suspected RLL Pneumonia  Will repeat a chest x-ray- may require thoracentesis for diagnostic and therapeutic purposes - continue broad empiric abx for suspected pnuemonia   Persistent sinus tachycardia  Likey simply due to acute infection and fever - no chest pain - d-dimer elevated, but this may simply be due to infection -bilateral lower extremity venous Dopplers negative for DVT or SVT- low risk as no other sx to suggest PE therefore will not check CTa chest yet or begin full dose anticoag  Status post heart transplant May 2021 Continue immunosuppressive medications -unable to be transferred to his transplant center at present due to [facility] closed to excepting transfers for intermediate level of care or medical beds.  Not appropriate for ICU at this time.  I discussed with Cardiology at [facility].  Continue immunosuppressants.  Acute on chronic hypoxic respiratory failure Typically requires O2  support at night only -oxygen saturations stable at present with only minimal support.  Saturating in the 100/high 90s on 2 L/min [place] oxygen.  ESRD - HD M/W/F Nephrology consultation appreciated.  Plan for HD via left TDC.  Right upper arm  HeRO  HTN Continue usual diltiazem  Acute on chronic anemia Hemoglobin at [facility] on 8/4: 8.7.  Presented with hemoglobin of 8.7 which is drifted down to 7.6 in the absence of overt bleeding.?  Acute illness.  Follow CBC in a.m. and transfuse if hemoglobin 7 g or less  Thrombocytopenia Likely due to acute illness/infectious etiology or antibiotics.  Follow CBC in a.m.   Family Communication: Spoke with sister at bedside at length today Status is: Inpatient  Remains inpatient appropriate because:Inpatient level of care appropriate due to severity of illness  Dispo: The patient is from: Home              Anticipated d/c is to: Home              Patient currently is not medically stable to d/c.   Difficult to place patient No   Objective:  Vitals:   06/21/21 1400 06/21/21 1430 06/21/21 1500 06/21/21 1530  BP: 128/66 (!) 115/43 (!) 104/58 (!) 95/22  Pulse: (!) 119 (!) 119 (!) 120 (!) 126  Resp: (!) 32 (!) 26 (!) 31 (!) 28  Temp:      TempSrc:      SpO2: 95% 98% 95% 95%  Weight:      Height:         Examination:  General exam: Middle-age male, moderately built and nourished sitting up comfortably in bed without distress. Respiratory system: Reduced breath sounds right base with some bronchial breath sounds.  Rest of lung fields clear to auscultation.  No wheezing, rhonchi or crackles.  Midline sternotomy scar.  Left TDC. Cardiovascular system: S1 & S2 heard, regular tachycardia.Marland Kitchen No JVD, murmurs, rubs, gallops or clicks. No pedal edema.  Telemetry personally reviewed: ST in the 100s-120s. Gastrointestinal system: Abdomen is nondistended, soft and nontender. No organomegaly or masses felt. Normal bowel sounds heard. Central nervous system: Alert and oriented. No focal neurological deficits. Extremities: Symmetric 5 x 5 power.  Right upper arm with HeRo and mildly swollen upper arm. Skin: No rashes, lesions or ulcers Psychiatry: Judgement and insight appear normal. Mood & affect  appropriate.    CBC: Recent Labs  Lab 06/19/21 1741 06/20/21 0325 06/21/21 0214  WBC 9.3 10.3 9.4  NEUTROABS 6.9  --   --   HGB 8.7* 8.2* 7.6*  HCT 30.0* 27.3* 26.5*  MCV 101.7* 101.1* 101.1*  PLT 175 142* 134*    Basic Metabolic Panel: Recent Labs  Lab 06/19/21 1741 06/20/21 0325 06/21/21 0214  NA 139 137 136  K 4.3 4.5 4.6  CL 97* 96* 95*  CO2 '30 26 25  '$ GLUCOSE 101* 91 100*  BUN 24* 31* 45*  CREATININE 5.76* 6.40* 8.34*  CALCIUM 8.8* 8.6* 8.6*  MG  --  1.8  --   PHOS  --  4.5  --     GFR: Estimated Creatinine Clearance: 8.8 mL/min (A) (by C-G formula based on SCr of 8.34 mg/dL (H)).  Liver Function Tests: Recent Labs  Lab 06/19/21 1741 06/21/21 0214  AST 16 11*  ALT 10 8  ALKPHOS 75 60  BILITOT 0.8 0.9  PROT 6.6 6.2*  ALBUMIN 3.6 2.9*    Recent Labs  Lab 06/19/21 1741  LIPASE 69*  Recent Results (from the past 240 hour(s))  Resp Panel by RT-PCR (Flu A&B, Covid) Nasopharyngeal Swab     Status: None   Collection Time: 06/19/21  5:34 PM   Specimen: Nasopharyngeal Swab; Nasopharyngeal(NP) swabs in vial transport medium  Result Value Ref Range Status   SARS Coronavirus 2 by RT PCR NEGATIVE NEGATIVE Final    Comment: (NOTE) SARS-CoV-2 target nucleic acids are NOT DETECTED.  The SARS-CoV-2 RNA is generally detectable in upper respiratory specimens during the acute phase of infection. The lowest concentration of SARS-CoV-2 viral copies this assay can detect is 138 copies/mL. A negative result does not preclude SARS-Cov-2 infection and should not be used as the sole basis for treatment or other patient management decisions. A negative result may occur with  improper specimen collection/handling, submission of specimen other than nasopharyngeal swab, presence of viral mutation(s) within the areas targeted by this assay, and inadequate number of viral copies(<138 copies/mL). A negative result must be combined with clinical observations, patient  history, and epidemiological information. The expected result is Negative.  Fact Sheet for Patients:  EntrepreneurPulse.com.au  Fact Sheet for Healthcare Providers:  IncredibleEmployment.be  This test is no t yet approved or cleared by the [place] FDA and  has been authorized for detection and/or diagnosis of SARS-CoV-2 by FDA under an Emergency Use Authorization (EUA). This EUA will remain  in effect (meaning this test can be used) for the duration of the COVID-19 declaration under Section 564(b)(1) of the Act, 21 U.S.C.section 360bbb-3(b)(1), unless the authorization is terminated  or revoked sooner.       Influenza A by PCR NEGATIVE NEGATIVE Final   Influenza B by PCR NEGATIVE NEGATIVE Final    Comment: (NOTE) The Xpert Xpress SARS-CoV-2/FLU/RSV plus assay is intended as an aid in the diagnosis of influenza from Nasopharyngeal swab specimens and should not be used as a sole basis for treatment. Nasal washings and aspirates are unacceptable for Xpert Xpress SARS-CoV-2/FLU/RSV testing.  Fact Sheet for Patients: EntrepreneurPulse.com.au  Fact Sheet for Healthcare Providers: IncredibleEmployment.be  This test is not yet approved or cleared by the [place] FDA and has been authorized for detection and/or diagnosis of SARS-CoV-2 by FDA under an Emergency Use Authorization (EUA). This EUA will remain in effect (meaning this test can be used) for the duration of the COVID-19 declaration under Section 564(b)(1) of the Act, 21 U.S.C. section 360bbb-3(b)(1), unless the authorization is terminated or revoked.  Performed at [facility], [place] [address], [place], [place] [address]   Blood culture (routine x 2)     Status: None (Preliminary result)   Collection Time: 06/19/21  7:14 PM   Specimen: BLOOD LEFT HAND  Result Value Ref Range Status   Specimen Description BLOOD LEFT HAND  Final    Special Requests   Final    BOTTLES DRAWN AEROBIC AND ANAEROBIC Blood Culture results may not be optimal due to an inadequate volume of blood received in culture bottles   Culture   Final    NO GROWTH 2 DAYS Performed at [facility], [place] [address] [place], [place] [address]    Report Status PENDING  Incomplete      Scheduled Meds:  aspirin EC  81 mg Oral q morning   atorvastatin  20 mg Oral q morning   calcitRIOL  0.75 mcg Oral Q M,W,F-HD   calcium carbonate  1,250 mg Oral QPC breakfast   Chlorhexidine Gluconate Cloth  6 each Topical Q0600  clopidogrel  75 mg Oral q morning   [START ON 06/23/2021] darbepoetin (ARANESP) injection - DIALYSIS  60 mcg Intravenous Q Wed-HD   diltiazem  180 mg Oral QPM   mirtazapine  45 mg Oral QPM   mycophenolate  180 mg Oral BID   pantoprazole  40 mg Oral q morning   tacrolimus  6 mg Oral BID   Continuous Infusions:  piperacillin-tazobactam (ZOSYN)  IV 2.25 g (06/21/21 0548)   vancomycin       LOS: 2 days   Vernell Leep, MD, [place], [facility]. [facility]  To contact the attending provider between 7A-7P or the covering provider during after hours 7P-7A, please log into the web site [email or web address] and access using universal [place] password for that web site. If you do not have the password, please call the hospital operator.

## 2021-06-21 NOTE — Progress Notes (Signed)
BLE venous duplex has been completed.  Results can be found under chart review under CV PROC. 06/21/2021 2:19 PM Savhanna Sliva RVT, RDMS

## 2021-06-22 ENCOUNTER — Inpatient Hospital Stay (HOSPITAL_COMMUNITY): Payer: 59

## 2021-06-22 ENCOUNTER — Encounter (HOSPITAL_COMMUNITY): Payer: Self-pay | Admitting: Internal Medicine

## 2021-06-22 ENCOUNTER — Encounter (HOSPITAL_COMMUNITY)
Admission: EM | Disposition: A | Discharge status: Home / Self Care | Payer: Self-pay | Source: Home / Self Care | Attending: Internal Medicine

## 2021-06-22 DIAGNOSIS — J90 Pleural effusion, not elsewhere classified: Secondary | ICD-10-CM

## 2021-06-22 DIAGNOSIS — R509 Fever, unspecified: Secondary | ICD-10-CM | POA: Diagnosis not present

## 2021-06-22 HISTORY — PX: THORACENTESIS: SHX235

## 2021-06-22 LAB — CBC
HCT: 23.7 % — ABNORMAL LOW (ref 39.0–52.0)
Hemoglobin: 7.2 g/dL — ABNORMAL LOW (ref 13.0–17.0)
MCH: 29.9 pg (ref 26.0–34.0)
MCHC: 30.4 g/dL (ref 30.0–36.0)
MCV: 98.3 fL (ref 80.0–100.0)
Platelets: 134 10*3/uL — ABNORMAL LOW (ref 150–400)
RBC: 2.41 MIL/uL — ABNORMAL LOW (ref 4.22–5.81)
RDW: 14.4 % (ref 11.5–15.5)
WBC: 7.6 10*3/uL (ref 4.0–10.5)
nRBC: 0 % (ref 0.0–0.2)

## 2021-06-22 LAB — PROTEIN, PLEURAL OR PERITONEAL FLUID: Total protein, fluid: 3.6 g/dL

## 2021-06-22 LAB — GLUCOSE, PLEURAL OR PERITONEAL FLUID: Glucose, Fluid: 101 mg/dL

## 2021-06-22 LAB — BODY FLUID CELL COUNT WITH DIFFERENTIAL
Lymphs, Fluid: 97 %
Monocyte-Macrophage-Serous Fluid: 1 % — ABNORMAL LOW (ref 50–90)
Neutrophil Count, Fluid: 2 % (ref 0–25)
Total Nucleated Cell Count, Fluid: 719 cu mm (ref 0–1000)

## 2021-06-22 LAB — LACTATE DEHYDROGENASE: LDH: 166 U/L (ref 98–192)

## 2021-06-22 LAB — LACTATE DEHYDROGENASE, PLEURAL OR PERITONEAL FLUID: LD, Fluid: 101 U/L — ABNORMAL HIGH (ref 3–23)

## 2021-06-22 LAB — TYPE AND SCREEN
ABO/RH(D): A POS
Antibody Screen: NEGATIVE

## 2021-06-22 LAB — AMYLASE, PLEURAL OR PERITONEAL FLUID: Amylase, Fluid: 36 U/L

## 2021-06-22 LAB — ABO/RH: ABO/RH(D): A POS

## 2021-06-22 LAB — PROCALCITONIN: Procalcitonin: 5.57 ng/mL

## 2021-06-22 LAB — PROTEIN, TOTAL: Total Protein: 6.1 g/dL — ABNORMAL LOW (ref 6.5–8.1)

## 2021-06-22 LAB — HEPATITIS B SURFACE ANTIBODY, QUANTITATIVE: Hep B S AB Quant (Post): <3.1 m[IU]/mL — ABNORMAL LOW (ref >9.9)

## 2021-06-22 SURGERY — THORACENTESIS
Anesthesia: LOCAL | Laterality: Right

## 2021-06-22 MED ORDER — CHLORHEXIDINE GLUCONATE CLOTH 2 % EX PADS
6.0000 | MEDICATED_PAD | Freq: Every day | CUTANEOUS | Status: DC
  Administered 2021-06-22 – 2021-06-24 (×3): 6 via TOPICAL

## 2021-06-22 NOTE — Progress Notes (Signed)
[facility] ROUNDING NOTE   Subjective:   Interval History: This is a 65 year old gentleman with a history of heart transplantation at [facility] May [id number] complicated by prolonged respiratory failure requiring tracheostomy PEG placement complicated by perforation and exploratory laparotomy x2.  He is also end-stage renal disease and presented with right pleural effusion and suspected right lower lobe pneumonia.  Successful dialysis 06/21/2021 with 1.2 L removed  Blood pressure 123/63 pulse 110 temperature 98.7 O2 sats 100% 2 L nasal cannula  Sodium 136 potassium 4.6 chloride 95 CO2 25 BUN 45 creatinine 8.34 glucose 100 calcium 8.6 albumin 2.9 hemoglobin 7.6.   OP HD: [facility] MWF   4h  400/500   67kg  2/2 bath  TDC  /RU HeRO (not sure when ready to use)  Hep 2000  - mircera 75 q2, last 8/17  - calcitriol 0.75 po tiw   Objective:  Vital signs in last 24 hours:  Temp:  [98.1 F (36.7 C)-102.4 F (39.1 C)] 98.7 F (37.1 C) (08/30 0327) Pulse Rate:  [78-127] 110 (08/30 0327) Resp:  [18-38] 18 (08/30 0327) BP: (95-161)/(22-79) 119/61 (08/30 0327) SpO2:  [95 %-100 %] 97 % (08/30 0327) Weight:  [67.4 kg-68.6 kg] 67.4 kg (08/29 1639)  Weight change: 0.3 kg Filed Weights   06/21/21 0328 06/21/21 1315 06/21/21 1639  Weight: 68.3 kg 68.6 kg 67.4 kg    Intake/Output: I/O last 3 completed shifts: In: 50 [IV Piggyback:50] Out: 1270 [Other:1270]   Intake/Output this shift:  No intake/output data recorded. Gen alert, no distress, somewhat frail No rash, cyanosis or gangrene Sclera anicteric, throat clear  No jvd or bruits Chest clear bilat to bases, no rales/ wheezing RRR no MRG Abd soft ntnd no mass or ascites +bs GU normal male MS no joint effusions or deformity Ext no LE or UE edema, no wounds or ulcers Neuro is alert, Ox 3 , nf, gen weakness  RIJ TDC/ RUA HeRO AVG+bruit   Basic Metabolic Panel: Recent Labs  Lab 06/19/21 1741 06/20/21 0325 06/21/21 0214  NA 139 137  136  K 4.3 4.5 4.6  CL 97* 96* 95*  CO2 '30 26 25  '$ GLUCOSE 101* 91 100*  BUN 24* 31* 45*  CREATININE 5.76* 6.40* 8.34*  CALCIUM 8.8* 8.6* 8.6*  MG  --  1.8  --   PHOS  --  4.5  --      Liver Function Tests: Recent Labs  Lab 06/19/21 1741 06/21/21 0214  AST 16 11*  ALT 10 8  ALKPHOS 75 60  BILITOT 0.8 0.9  PROT 6.6 6.2*  ALBUMIN 3.6 2.9*    Recent Labs  Lab 06/19/21 1741  LIPASE 69*    No results for input(s): AMMONIA in the last 168 hours.  CBC: Recent Labs  Lab 06/19/21 1741 06/20/21 0325 06/21/21 0214 06/22/21 0119  WBC 9.3 10.3 9.4 7.6  NEUTROABS 6.9  --   --   --   HGB 8.7* 8.2* 7.6* 7.2*  HCT 30.0* 27.3* 26.5* 23.7*  MCV 101.7* 101.1* 101.1* 98.3  PLT 175 142* 134* 134*     Cardiac Enzymes: No results for input(s): CKTOTAL, CKMB, CKMBINDEX, TROPONINI in the last 168 hours.  BNP: Invalid input(s): POCBNP  CBG: No results for input(s): GLUCAP in the last 168 hours.  Microbiology: Results for orders placed or performed during the hospital encounter of 06/19/21  Resp Panel by RT-PCR (Flu A&B, Covid) Nasopharyngeal Swab     Status: None   Collection Time: 06/19/21  5:34 PM   Specimen: Nasopharyngeal Swab; Nasopharyngeal(NP) swabs in vial transport medium  Result Value Ref Range Status   SARS Coronavirus 2 by RT PCR NEGATIVE NEGATIVE Final    Comment: (NOTE) SARS-CoV-2 target nucleic acids are NOT DETECTED.  The SARS-CoV-2 RNA is generally detectable in upper respiratory specimens during the acute phase of infection. The lowest concentration of SARS-CoV-2 viral copies this assay can detect is 138 copies/mL. A negative result does not preclude SARS-Cov-2 infection and should not be used as the sole basis for treatment or other patient management decisions. A negative result may occur with  improper specimen collection/handling, submission of specimen other than nasopharyngeal swab, presence of viral mutation(s) within the areas targeted by  this assay, and inadequate number of viral copies(<138 copies/mL). A negative result must be combined with clinical observations, patient history, and epidemiological information. The expected result is Negative.  Fact Sheet for Patients:  EntrepreneurPulse.com.au  Fact Sheet for Healthcare Providers:  IncredibleEmployment.be  This test is no t yet approved or cleared by the [place] FDA and  has been authorized for detection and/or diagnosis of SARS-CoV-2 by FDA under an Emergency Use Authorization (EUA). This EUA will remain  in effect (meaning this test can be used) for the duration of the COVID-19 declaration under Section 564(b)(1) of the Act, 21 U.S.C.section 360bbb-3(b)(1), unless the authorization is terminated  or revoked sooner.       Influenza A by PCR NEGATIVE NEGATIVE Final   Influenza B by PCR NEGATIVE NEGATIVE Final    Comment: (NOTE) The Xpert Xpress SARS-CoV-2/FLU/RSV plus assay is intended as an aid in the diagnosis of influenza from Nasopharyngeal swab specimens and should not be used as a sole basis for treatment. Nasal washings and aspirates are unacceptable for Xpert Xpress SARS-CoV-2/FLU/RSV testing.  Fact Sheet for Patients: EntrepreneurPulse.com.au  Fact Sheet for Healthcare Providers: IncredibleEmployment.be  This test is not yet approved or cleared by the [place] FDA and has been authorized for detection and/or diagnosis of SARS-CoV-2 by FDA under an Emergency Use Authorization (EUA). This EUA will remain in effect (meaning this test can be used) for the duration of the COVID-19 declaration under Section 564(b)(1) of the Act, 21 U.S.C. section 360bbb-3(b)(1), unless the authorization is terminated or revoked.  Performed at [facility], [place] [address] [place], [place] [address]   Blood culture (routine x 2)     Status: None (Preliminary result)    Collection Time: 06/19/21  7:14 PM   Specimen: BLOOD LEFT HAND  Result Value Ref Range Status   Specimen Description BLOOD LEFT HAND  Final   Special Requests   Final    BOTTLES DRAWN AEROBIC AND ANAEROBIC Blood Culture results may not be optimal due to an inadequate volume of blood received in culture bottles   Culture   Final    NO GROWTH 2 DAYS Performed at [facility], [address], [place], [place] [address]    Report Status PENDING  Incomplete    Coagulation Studies: No results for input(s): LABPROT, INR in the last 72 hours.  Urinalysis: Recent Labs    06/19/21 1721  COLORURINE YELLOW  LABSPEC 1.017  PHURINE 6.0  GLUCOSEU 50*  HGBUR MODERATE*  BILIRUBINUR NEGATIVE  KETONESUR 5*  PROTEINUR 100*  NITRITE NEGATIVE  LEUKOCYTESUR LARGE*       Imaging: DG Chest 2 View  Result Date: 06/21/2021 CLINICAL DATA:  Pneumonia and fever. EXAM: CHEST - 2 VIEW COMPARISON:  Chest radiograph  dated 06/19/2021. Chest CT dated 06/19/2021. FINDINGS: Left-sided dialysis catheter as well as a tunnel catheter on the right side in similar position. Interval increase in the size of the right pleural effusion with associated increase in right lung opacity compared to prior radiograph. Small left pleural effusion and left lung base atelectasis or infiltrate. No pneumothorax. Stable cardiomediastinal silhouette median sternotomy wires. No acute osseous pathology. IMPRESSION: Interval increase in the size of the right pleural effusion with associated increase in right lung opacity since the prior radiograph. Electronically Signed   By: Anner Crete M.D.   On: 06/21/2021 20:17   VAS [place] LOWER EXTREMITY VENOUS (DVT)  Result Date: 06/21/2021  Lower Venous DVT Study Patient Name:  MURRIEL BECKEN  Date of Exam:   06/21/2021 Medical Rec #: [id number]      Accession #:    [id number] Date of Birth: 07-12-1956      Patient Gender: M Patient Age:   10 years Exam Location[facility]  [facility]  Procedure:      VAS [place] LOWER EXTREMITY VENOUS (DVT) Referring Phys: JEFFREY MCCLUNG --------------------------------------------------------------------------------  Indications: "Suspected DVT" - per MD order. Other Indications: Elevated D-dimer & tachycardia in patient with sepsis. Comparison Study: Previous exam '@[facility]'$  04/15/2020 - negative for DVT Performing Technologist: Jody Hill RVT, RDMS  Examination Guidelines: A complete evaluation includes B-mode imaging, spectral Doppler, color Doppler, and power Doppler as needed of all accessible portions of each vessel. Bilateral testing is considered an integral part of a complete examination. Limited examinations for reoccurring indications may be performed as noted. The reflux portion of the exam is performed with the patient in reverse Trendelenburg.  +---------+---------------+---------+-----------+----------+--------------+ RIGHT    CompressibilityPhasicitySpontaneityPropertiesThrombus Aging +---------+---------------+---------+-----------+----------+--------------+ CFV      Full           Yes      Yes                                 +---------+---------------+---------+-----------+----------+--------------+ SFJ      Full                                                        +---------+---------------+---------+-----------+----------+--------------+ FV Prox  Full           Yes      Yes                                 +---------+---------------+---------+-----------+----------+--------------+ FV Mid   Full           Yes      Yes                                 +---------+---------------+---------+-----------+----------+--------------+ FV DistalFull           Yes      Yes                                 +---------+---------------+---------+-----------+----------+--------------+ PFV      Full                                                         +---------+---------------+---------+-----------+----------+--------------+  POP      Full           Yes      Yes                                 +---------+---------------+---------+-----------+----------+--------------+ PTV      Full                                                        +---------+---------------+---------+-----------+----------+--------------+ PERO     Full                                                        +---------+---------------+---------+-----------+----------+--------------+   +---------+---------------+---------+-----------+----------+--------------+ LEFT     CompressibilityPhasicitySpontaneityPropertiesThrombus Aging +---------+---------------+---------+-----------+----------+--------------+ CFV      Full           Yes      Yes                                 +---------+---------------+---------+-----------+----------+--------------+ SFJ      Full                                                        +---------+---------------+---------+-----------+----------+--------------+ FV Prox  Full           Yes      Yes                                 +---------+---------------+---------+-----------+----------+--------------+ FV Mid   Full           Yes      Yes                                 +---------+---------------+---------+-----------+----------+--------------+ FV DistalFull           Yes      Yes                                 +---------+---------------+---------+-----------+----------+--------------+ PFV      Full                                                        +---------+---------------+---------+-----------+----------+--------------+ POP      Full           Yes      Yes                                 +---------+---------------+---------+-----------+----------+--------------+ PTV  Full                                                         +---------+---------------+---------+-----------+----------+--------------+ PERO     Full                                                        +---------+---------------+---------+-----------+----------+--------------+     Summary: BILATERAL: - No evidence of deep vein thrombosis seen in the lower extremities, bilaterally. - No evidence of superficial venous thrombosis in the lower extremities, bilaterally. -No evidence of popliteal cyst, bilaterally.   *See table(s) above for measurements and observations. Electronically signed by Servando Snare MD on 06/21/2021 at 5:33:34 PM.    Final      Medications:    meropenem (MERREM) IV 500 mg (06/21/21 2040)   vancomycin 750 mg (06/21/21 1618)    atorvastatin  20 mg Oral q morning   calcitRIOL  0.75 mcg Oral Q M,W,F-HD   calcium carbonate  1,250 mg Oral QPC breakfast   Chlorhexidine Gluconate Cloth  6 each Topical Q0600   [START ON 06/23/2021] darbepoetin (ARANESP) injection - DIALYSIS  60 mcg Intravenous Q Wed-HD   diltiazem  180 mg Oral QPM   heparin  2,000 Units Dialysis Once in dialysis   midodrine  10 mg Oral Q M,W,F-HD   mirtazapine  45 mg Oral QPM   mycophenolate  180 mg Oral BID   pantoprazole  40 mg Oral q morning   tacrolimus  6 mg Oral BID   acetaminophen **OR** acetaminophen, levalbuterol, ondansetron **OR** ondansetron (ZOFRAN) IV  Assessment/ Plan:  Fever/ sepsis - in immunocomprised pt, per pmd. Suspected lung source, on IV vanc and zosyn. Sig consolidation and/or effusion R lower/ middle lobe by CT.  Heart transplant - on prograf and cellcept, per pmd.  ESRD - on HD MWF. Has not missed. HeRO placed 05/15/21, ready to use 8/23.  Next dialysis 06/23/2021 HTN /vol - BP's are wnl, cont home meds. No edema on exam, CXR / CT w/o edema. Small UF 1-2 L w/ next HD as tol.  A/C resp failure - on prn O2 at home, continuous here.  Anemia ckd - Hb 8's here, next esa due 8/31, have ordered   noted fairly low iron saturations.  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

## 2021-06-22 NOTE — Procedures (Signed)
Thoracentesis  Procedure Note  Sahand Sposito  [id number]  [birth year]  Date:06/22/21  Time:2:37 PM   Provider Performing:Pete E Kary Kos   Procedure: Thoracentesis with imaging guidance [id number])  Indication(s) Pleural Effusion  Consent Risks of the procedure as well as the alternatives and risks of each were explained to the patient and/or caregiver.  Consent for the procedure was obtained and is signed in the bedside chart  Anesthesia Topical only with 1% lidocaine    Time Out Verified patient identification, verified procedure, site/side was marked, verified correct patient position, special equipment/implants available, medications/allergies/relevant history reviewed, required imaging and test results available.   Sterile Technique Maximal sterile technique including full sterile barrier drape, hand hygiene, sterile gown, sterile gloves, mask, hair covering, sterile ultrasound probe cover (if used).  Procedure Description Ultrasound was used to identify appropriate pleural anatomy for placement and overlying skin marked.  Area of drainage cleaned and draped in sterile fashion. Lidocaine was used to anesthetize the skin and subcutaneous tissue.  700 cc's of orange cloudy  appearing fluid was drained from the right pleural space. Catheter then removed and bandaid applied to site.   Complications/Tolerance None; patient tolerated the procedure well. Chest X-ray is ordered to confirm no post-procedural complication.   EBL Minimal   Specimen(s) Pleural fluid  Erick Colace ACNP-BC [place] Pager # [phone] OR # [phone] if no answer

## 2021-06-22 NOTE — Progress Notes (Signed)
PCCM Note  Discussed rationale for diagnostic thoracentesis with the patient and with Dr. Algis Liming.  I am hesitant to perform a full thoracentesis while he still has Plavix on board (stopped this morning).  Would like to get him back on his Plavix and aspirin as soon as feasible given his recent right upper extremity graft.  Depending on results of the diagnostic thoracentesis and his clinical status will have to decide if he needs another intervention (chest tube, therapeutic thoracentesis, etc.).  Patient understands the risk, benefits and agrees to proceed.  I will check to see if there is an accessible pocket for diagnostic thoracentesis by bedside ultrasound.   Baltazar Apo, MD, PhD 06/22/2021, 10:22 AM [facility] [phone] or if no answer before 7:00PM call [phone] For any issues after 7:00PM please call eLink [phone]

## 2021-06-22 NOTE — Consult Note (Signed)
[place] [facility]       Reason for Consult:fever    Referring Physician: Dr. Algis Liming  Active Problems:   Sepsis ([place])   ESRD (end stage renal disease) ([place])   Essential hypertension   Status post heart transplantation ([place])   HLD (hyperlipidemia)   Fever   Acute respiratory failure with hypoxia (H[facility])   Immunocompromised patient ([place])   Sinus tachycardia    atorvastatin  20 mg Oral q morning   calcitRIOL  0.75 mcg Oral Q M,W,F-HD   calcium carbonate  1,250 mg Oral QPC breakfast   Chlorhexidine Gluconate Cloth  6 each Topical Q0600   Chlorhexidine Gluconate Cloth  6 each Topical Q0600   [START ON 06/23/2021] darbepoetin (ARANESP) injection - DIALYSIS  60 mcg Intravenous Q Wed-HD   diltiazem  180 mg Oral QPM   heparin  2,000 Units Dialysis Once in dialysis   midodrine  10 mg Oral Q M,W,F-HD   mirtazapine  45 mg Oral QPM   mycophenolate  180 mg Oral BID   pantoprazole  40 mg Oral q morning   tacrolimus  6 mg Oral BID    Recommendations: Agree with thoracentesis Check for bacterial, AFB and fungal organisms, norcardia   Assessment: He is febrile with a right pleural effusion and a history of M abscessus bacteremia.  Differential of his fever is broad and pneumonia/infected effusion possible.  Previously had a right pleural effusion but more recently on CXRs much less.    Antibiotics: Vancomycin and meropenem  HPI: Rickey Erickson is a 65 y.o. 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 Abscessus S Amikacin, I Cefoxitin, R Cipro, S Clarithro, R Doxy, I Imipenem, S Linezolid, R Minocycline, R Moxifloxacin, R TMP-SMX, Tigecycline MIC 0.12) who has come in here with fever to 102, shortness of breath and cough and chest CT with moderate right pleural effusion.  He was ultimately treated with  omadacycline, Tedizolid and azithromycin through 01/19/21 and started therapy 09/25/20 with amikacin, linezolid, azithromycin and imipenem, then 12/10 stopped amikacin (concern for hearing loss on audiology test, though was c/w expected aging changes) and added eravacycline, then 10/09/21 changed to azithromycin, imipenem, ceftaroline and eravacycline.  Omadacycline started 11/10/20 and Tedizolid added 11/07/20 with azithromycin for the final regimen.  He developed thrombocytopenia on linezolid.  He is ESRD on hemodialysis and has been compliant with dialysis.  He uses oxygen at night but has had recent oxygen requirement during the day.  His immunosuppression is mycophenolate and Prograf.  He is having some pyuria when he urinates. No diarrhea, no headache, no vision changes, no abdominal pain, no myalgias or arthralgias.     Review of Systems:  Constitutional: positive for fevers and chills or negative for anorexia Ears, nose, mouth, throat, and face: negative for sore mouth and sore throat Respiratory: positive for cough or dyspnea on exertion, negative for sputum Cardiovascular: negative for irregular heart beats Gastrointestinal: positive for loose stools (< 5), negative for nausea and vomiting Integument/breast: negative for rash Hematologic/lymphatic: negative for lymphadenopathy Musculoskeletal: negative for myalgias and arthralgias Neurological: negative for headaches and dizziness All other systems reviewed and are negative    PMH: 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 and s/p heart transplant May 2021 ESRD HTN Chronic respiratory failure   Social History  Tobacco Use   Smoking status: Never   Smokeless tobacco: Never  Substance Use Topics   Alcohol use: Never   Drug use: Never    [place]: + cardiac disease  Allergies  Allergen Reactions   Scopolamine Other (See Comments)     Hallucinations Induces severe delirium    Lorazepam Other  (See Comments)    Hallucinations Severe somnolence/delirium     Physical Exam: Constitutional: in no apparent distress  Vitals:   06/22/21 0817 06/22/21 0900  BP: (!) 147/68 (!) 116/54  Pulse:  (!) 114  Resp:  (!) 25  Temp: 99.2 F (37.3 C) 98.4 F (36.9 C)  SpO2:  98%   EYES: anicteric ENMT: no thrush, no oral lesions Cardiovascular: Cor RRR Respiratory: diminished, no wheezes, normal respiratory effort GI: Bowel sounds are normal, liver is not enlarged, spleen is not enlarged Musculoskeletal: no pedal edema noted Skin: negatives: no rash Hematologic: no cervical lymphadenopathy Neuro: noon-focal  Lab Results  Component Value Date   WBC 7.6 06/22/2021   HGB 7.2 (L) 06/22/2021   HCT 23.7 (L) 06/22/2021   MCV 98.3 06/22/2021   PLT 134 (L) 06/22/2021    Lab Results  Component Value Date   CREATININE 8.34 (H) 06/21/2021   BUN 45 (H) 06/21/2021   NA 136 06/21/2021   K 4.6 06/21/2021   CL 95 (L) 06/21/2021   CO2 25 06/21/2021    Lab Results  Component Value Date   ALT 8 06/21/2021   AST 11 (L) 06/21/2021   ALKPHOS 60 06/21/2021     Microbiology: Recent Results (from the past 240 hour(s))  Resp Panel by RT-PCR (Flu A&B, Covid) Nasopharyngeal Swab     Status: None   Collection Time: 06/19/21  5:34 PM   Specimen: Nasopharyngeal Swab; Nasopharyngeal(NP) swabs in vial transport medium  Result Value Ref Range Status   SARS Coronavirus 2 by RT PCR NEGATIVE NEGATIVE Final    Comment: (NOTE) SARS-CoV-2 target nucleic acids are NOT DETECTED.  The SARS-CoV-2 RNA is generally detectable in upper respiratory specimens during the acute phase of infection. The lowest concentration of SARS-CoV-2 viral copies this assay can detect is 138 copies/mL. A negative result does not preclude SARS-Cov-2 infection and should not be used as the sole basis for treatment or other patient management decisions. A negative result may occur with  improper specimen collection/handling,  submission of specimen other than nasopharyngeal swab, presence of viral mutation(s) within the areas targeted by this assay, and inadequate number of viral copies(<138 copies/mL). A negative result must be combined with clinical observations, patient history, and epidemiological information. The expected result is Negative.  Fact Sheet for Patients:  EntrepreneurPulse.com.au  Fact Sheet for Healthcare Providers:  IncredibleEmployment.be  This test is no t yet approved or cleared by the [place] FDA and  has been authorized for detection and/or diagnosis of SARS-CoV-2 by FDA under an Emergency Use Authorization (EUA). This EUA will remain  in effect (meaning this test can be used) for the duration of the COVID-19 declaration under Section 564(b)(1) of the Act, 21 U.S.C.section 360bbb-3(b)(1), unless the authorization is terminated  or revoked sooner.       Influenza A by PCR NEGATIVE NEGATIVE Final   Influenza B by PCR NEGATIVE NEGATIVE Final    Comment: (NOTE) The Xpert Xpress SARS-CoV-2/FLU/RSV plus assay is intended as an aid in the diagnosis of influenza from Nasopharyngeal swab specimens and should not be used as a sole basis for treatment. Nasal washings and aspirates  are unacceptable for Xpert Xpress SARS-CoV-2/FLU/RSV testing.  Fact Sheet for Patients: EntrepreneurPulse.com.au  Fact Sheet for Healthcare Providers: IncredibleEmployment.be  This test is not yet approved or cleared by the [place] FDA and has been authorized for detection and/or diagnosis of SARS-CoV-2 by FDA under an Emergency Use Authorization (EUA). This EUA will remain in effect (meaning this test can be used) for the duration of the COVID-19 declaration under Section 564(b)(1) of the Act, 21 U.S.C. section 360bbb-3(b)(1), unless the authorization is terminated or revoked.  Performed at [facility], [place] [address] [place], [place] [address]   Blood culture (routine x 2)     Status: None (Preliminary result)   Collection Time: 06/19/21  7:14 PM   Specimen: BLOOD LEFT HAND  Result Value Ref Range Status   Specimen Description BLOOD LEFT HAND  Final   Special Requests   Final    BOTTLES DRAWN AEROBIC AND ANAEROBIC Blood Culture results may not be optimal due to an inadequate volume of blood received in culture bottles   Culture   Final    NO GROWTH 3 DAYS Performed at [facility], [place] [address], [place], [place] [address]    Report Status PENDING  Incomplete    Thayer Headings, [place] [facility] [place] [email or web address] 06/22/2021, 10:18 AM

## 2021-06-22 NOTE — Progress Notes (Signed)
Rickey Erickson  [id number] DOB: 1956/06/20 DOA: 06/19/2021 PCP: Rolene Course, MD    Brief Narrative:  65 year old who underwent heart transplantation at [facility] in May [id number] complicated by prolonged respiratory failure requiring tracheostomy (since decannulated), lung/diaphragm injury, multi microbial PNA, PEG tube complicated by perforation requiring exploratory laparotomy x2 with subsequent GJ placement, and ESRD who presented to the ED 8/27 with generalized malaise, progressive hypoxia, and fever to 102.  In the ED he was found to have a heart rate of 128 with respiratory rate of 23 and saturation 93% on nasal cannula support.  Potassium was 4.3.  CXR demonstrated moderate bilateral lower lobe atelectasis versus infiltrates with an air-fluid level in the right LL possibly representing a cavitary lesion.  The EDP contacted [facility] who in principal agreed to accept the patient but was unable to do so due to no beds being available.  I contacted [facility] on 8/29, discussed with Dr. Janith Lima, who has accepted in transfer to cardiology service (accepting MD Dr. Sherre Lain) when bed becomes available (no beds available at this time).  Should be okay to go to a telemetry bed.  Since unsure as to timing of bed availability, DC paperwork not completed at this time and will have to be completed when bed available.  PCCM consulted for right diagnostic/therapeutic thoracentesis, done on 8/30.  ID consulted.  Significant Events:  8/27 admit via ED 8/29 ongoing fevers up to 101 F, blood cultures x2 negative to date, ID consulted.  Call placed to [facility]-not excepting outside transfers, contacting their transplant team. 8/30 diagnostic and therapeutic right thoracentesis by PCCM, ID consult.  Consultants:  Infectious disease. PCCM  Code Status: FULL CODE  Antimicrobials:  Zosyn 8/27 > 8/29 Meropenem 8/29 > Vancomycin 8/27 >  DVT prophylaxis: SCDs  Subjective: Reports feeling more alert.  No dyspnea at rest but  has dyspnea on exertion.  No cough or chest pain.  Wants diet changed from renal to regular diet.  He and his sister indicate that he was on this diet even during prior hospitalizations at [facility].  Assessment & Plan:  Sepsis POA in immunocompromised patient - possible RLL PNA Fever 102 with tachycardia at presentation with suspected bacterial infection meeting criteria for sepsis. Initially treated empirically with IV vancomycin and Zosyn Ongoing fevers, 101 F on 8/29.  Blood cultures x2: Negative to date.  Patient also has a TDC for HD and right upper arm HeRo which could be a source but blood cultures are negative thus far. ID consultation appreciated.  Checking for bacterial, AFB and fungal organisms and nocardia in pleural fluid.  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 Continue vancomycin and meropenem.  Moderate right pleural effusion - compressive atelectasis of RLL and RML - partial compression of the right upper lobe - suspected RLL Pneumonia  Pulmonology consulted.  Need to rule out empyema. S/p ultrasound-guided right diagnostic and therapeutic thoracentesis-700 mL of orange cloudy appearing fluid drained. Aspirin and Plavix temporarily held for procedure and to be resumed as soon as safely possible postprocedure. Follow pleural fluid results.  Persistent sinus tachycardia  Likey simply due to acute infection and fever - no chest pain - d-dimer elevated, but this may simply be due to infection -bilateral lower extremity venous Dopplers negative for DVT or SVT- low risk as no other sx to suggest PE therefore will not check CTa chest yet or begin full dose anticoag  Status post heart transplant May 2021 Continue immunosuppressive medications -unable to be transferred  to his transplant center at present due to [facility] closed to accepting transfers for intermediate level of care or medical beds.  Not appropriate for ICU at this time.  I discussed  with Cardiology at [facility] on 8/29.  Continue immunosuppressants.  Acute on chronic hypoxic respiratory failure Typically requires O2 support at night only -oxygen saturations stable at present with only minimal support.  Saturating in the 100/high 90s on 2 L/min [place] oxygen.  ESRD - HD M/W/F Nephrology consultation appreciated.  Plan for HD via left TDC.  Right upper arm HeRO.  Next dialysis on 06/23/2021.  HTN Continue usual diltiazem  Acute on chronic anemia Hemoglobin at [facility] on 8/4: 8.7.  Presented with hemoglobin of 8.7 which is drifted down to 7.6 >7.2 in the absence of overt bleeding.?  Acute illness.  Follow CBC in a.m. and transfuse across HD if hemoglobin 7 g or less.  Discussed with Dr. Justin Mend.  Thrombocytopenia Likely due to acute illness/infectious etiology or antibiotics.  Stable.  Follow CBC in a.m.   Family Communication: Spoke with sister at bedside at length today Status is: Inpatient  Remains inpatient appropriate because:Inpatient level of care appropriate due to severity of illness  Dispo: The patient is from: Home              Anticipated d/c is to: Home              Patient currently is not medically stable to d/c.   Difficult to place patient No   Objective:  Vitals:   06/22/21 1430 06/22/21 1440 06/22/21 1445 06/22/21 1511  BP: 108/73  135/65 137/68  Pulse: (!) 113 (!) 109 (!) 107 (!) 110  Resp: (!) 28 (!) 29 (!) 27 (!) 22  Temp:    98.2 F (36.8 C)  TempSrc:    Oral  SpO2: 100% 100% 100%   Weight:      Height:         Examination:  General exam: Middle-age male, moderately built and nourished sitting up comfortably in bed without distress. Respiratory system: Reduced breath sounds right base with bronchial breath sounds.  Rest of lung fields clear to auscultation.  No increased work of breathing.  Midline sternotomy scar.  Left TDC. Cardiovascular system: S1 and S2 heard, regular mild tachycardia.  No JVD, murmurs or pedal edema.   Telemetry personally reviewed: ST in the 110s. Gastrointestinal system: Abdomen is nondistended, soft and nontender. No organomegaly or masses felt. Normal bowel sounds heard. Central nervous system: Alert and oriented. No focal neurological deficits. Extremities: Symmetric 5 x 5 power.  Right upper arm with HeRo and mildly swollen upper arm. Skin: No rashes, lesions or ulcers Psychiatry: Judgement and insight appear normal. Mood & affect appropriate.    CBC: Recent Labs  Lab 06/19/21 1741 06/20/21 0325 06/21/21 0214 06/22/21 0119  WBC 9.3 10.3 9.4 7.6  NEUTROABS 6.9  --   --   --   HGB 8.7* 8.2* 7.6* 7.2*  HCT 30.0* 27.3* 26.5* 23.7*  MCV 101.7* 101.1* 101.1* 98.3  PLT 175 142* 134* [id number]*   Basic Metabolic Panel: Recent Labs  Lab 06/19/21 1741 06/20/21 0325 06/21/21 0214  NA 139 137 136  K 4.3 4.5 4.6  CL 97* 96* 95*  CO2 '30 26 25  '$ GLUCOSE 101* 91 100*  BUN 24* 31* 45*  CREATININE 5.76* 6.40* 8.34*  CALCIUM 8.8* 8.6* 8.6*  MG  --  1.8  --   PHOS  --  4.5  --    GFR: Estimated Creatinine Clearance: 8.6 mL/min (A) (by C-G formula based on SCr of 8.34 mg/dL (H)).  Liver Function Tests: Recent Labs  Lab 06/19/21 1741 06/21/21 0214  AST 16 11*  ALT 10 8  ALKPHOS 75 60  BILITOT 0.8 0.9  PROT 6.6 6.2*  ALBUMIN 3.6 2.9*   Recent Labs  Lab 06/19/21 1741  LIPASE 69*    Recent Results (from the past 240 hour(s))  Resp Panel by RT-PCR (Flu A&B, Covid) Nasopharyngeal Swab     Status: None   Collection Time: 06/19/21  5:34 PM   Specimen: Nasopharyngeal Swab; Nasopharyngeal(NP) swabs in vial transport medium  Result Value Ref Range Status   SARS Coronavirus 2 by RT PCR NEGATIVE NEGATIVE Final    Comment: (NOTE) SARS-CoV-2 target nucleic acids are NOT DETECTED.  The SARS-CoV-2 RNA is generally detectable in upper respiratory specimens during the acute phase of infection. The lowest concentration of SARS-CoV-2 viral copies this assay can detect is 138 copies/mL.  A negative result does not preclude SARS-Cov-2 infection and should not be used as the sole basis for treatment or other patient management decisions. A negative result may occur with  improper specimen collection/handling, submission of specimen other than nasopharyngeal swab, presence of viral mutation(s) within the areas targeted by this assay, and inadequate number of viral copies(<138 copies/mL). A negative result must be combined with clinical observations, patient history, and epidemiological information. The expected result is Negative.  Fact Sheet for Patients:  EntrepreneurPulse.com.au  Fact Sheet for Healthcare Providers:  IncredibleEmployment.be  This test is no t yet approved or cleared by the [place] FDA and  has been authorized for detection and/or diagnosis of SARS-CoV-2 by FDA under an Emergency Use Authorization (EUA). This EUA will remain  in effect (meaning this test can be used) for the duration of the COVID-19 declaration under Section 564(b)(1) of the Act, 21 U.S.C.section 360bbb-3(b)(1), unless the authorization is terminated  or revoked sooner.       Influenza A by PCR NEGATIVE NEGATIVE Final   Influenza B by PCR NEGATIVE NEGATIVE Final    Comment: (NOTE) The Xpert Xpress SARS-CoV-2/FLU/RSV plus assay is intended as an aid in the diagnosis of influenza from Nasopharyngeal swab specimens and should not be used as a sole basis for treatment. Nasal washings and aspirates are unacceptable for Xpert Xpress SARS-CoV-2/FLU/RSV testing.  Fact Sheet for Patients: EntrepreneurPulse.com.au  Fact Sheet for Healthcare Providers: IncredibleEmployment.be  This test is not yet approved or cleared by the [place] FDA and has been authorized for detection and/or diagnosis of SARS-CoV-2 by FDA under an Emergency Use Authorization (EUA). This EUA will remain in effect (meaning this test  can be used) for the duration of the COVID-19 declaration under Section 564(b)(1) of the Act, 21 U.S.C. section 360bbb-3(b)(1), unless the authorization is terminated or revoked.  Performed at [facility], [place] [address], [place], [place] [address]   Blood culture (routine x 2)     Status: None (Preliminary result)   Collection Time: 06/19/21  7:14 PM   Specimen: BLOOD LEFT HAND  Result Value Ref Range Status   Specimen Description BLOOD LEFT HAND  Final   Special Requests   Final    BOTTLES DRAWN AEROBIC AND ANAEROBIC Blood Culture results may not be optimal due to an inadequate volume of blood received in culture bottles   Culture   Final    NO GROWTH 3 DAYS Performed at [facility], [place] [address], [place], [place] [address]    Report Status PENDING  Incomplete      Scheduled Meds:  atorvastatin  20 mg Oral q morning   calcitRIOL  0.75 mcg Oral Q M,W,F-HD   calcium carbonate  1,250 mg Oral QPC breakfast   Chlorhexidine Gluconate Cloth  6 each Topical Q0600   Chlorhexidine Gluconate Cloth  6 each Topical Q0600   [START ON 06/23/2021] darbepoetin (ARANESP) injection - DIALYSIS  60 mcg Intravenous Q Wed-HD   diltiazem  180 mg Oral QPM   heparin  2,000 Units Dialysis Once in dialysis   midodrine  10 mg Oral Q M,W,F-HD   mirtazapine  45 mg Oral QPM   mycophenolate  180 mg Oral BID   pantoprazole  40 mg Oral q morning   tacrolimus  6 mg Oral BID   Continuous Infusions:  meropenem (MERREM) IV 500 mg (06/21/21 2040)   vancomycin 750 mg (06/21/21 1618)     LOS: 3 days   Vernell Leep, MD, [place], [facility]. [facility]  To contact the attending provider between 7A-7P or the covering provider during after hours 7P-7A, please log into the web site [email or web address] and access using universal [place] password for that web site. If you do not have the password, please call the hospital operator.

## 2021-06-23 DIAGNOSIS — R509 Fever, unspecified: Secondary | ICD-10-CM | POA: Diagnosis not present

## 2021-06-23 DIAGNOSIS — E785 Hyperlipidemia, unspecified: Secondary | ICD-10-CM

## 2021-06-23 DIAGNOSIS — J189 Pneumonia, unspecified organism: Secondary | ICD-10-CM

## 2021-06-23 LAB — TACROLIMUS LEVEL
Tacrolimus (FK506) - LabCorp: 6.3 ng/mL (ref 2.0–20.0)
Tacrolimus (FK506) - LabCorp: 5.6 ng/mL (ref 2.0–20.0)

## 2021-06-23 LAB — CBC
HCT: 26.9 % — ABNORMAL LOW (ref 39.0–52.0)
Hemoglobin: 7.8 g/dL — ABNORMAL LOW (ref 13.0–17.0)
MCH: 29.5 pg (ref 26.0–34.0)
MCHC: 29 g/dL — ABNORMAL LOW (ref 30.0–36.0)
MCV: 101.9 fL — ABNORMAL HIGH (ref 80.0–100.0)
Platelets: 144 10*3/uL — ABNORMAL LOW (ref 150–400)
RBC: 2.64 MIL/uL — ABNORMAL LOW (ref 4.22–5.81)
RDW: 14.6 % (ref 11.5–15.5)
WBC: 6.3 10*3/uL (ref 4.0–10.5)
nRBC: 0 % (ref 0.0–0.2)

## 2021-06-23 LAB — ACID FAST SMEAR (AFB, MYCOBACTERIA): Acid Fast Smear: NEGATIVE

## 2021-06-23 LAB — RENAL FUNCTION PANEL
Albumin: 2.6 g/dL — ABNORMAL LOW (ref 3.5–5.0)
Anion gap: 11 (ref 5–15)
BUN: 45 mg/dL — ABNORMAL HIGH (ref 8–23)
CO2: 27 mmol/L (ref 22–32)
Calcium: 8.6 mg/dL — ABNORMAL LOW (ref 8.9–10.3)
Chloride: 99 mmol/L (ref 98–111)
Creatinine, Ser: 8.7 mg/dL — ABNORMAL HIGH (ref 0.61–1.24)
GFR, Estimated: 6 mL/min — ABNORMAL LOW (ref >60)
Glucose, Bld: 114 mg/dL — ABNORMAL HIGH (ref 70–99)
Phosphorus: 6.5 mg/dL — ABNORMAL HIGH (ref 2.5–4.6)
Potassium: 4.2 mmol/L (ref 3.5–5.1)
Sodium: 137 mmol/L (ref 135–145)

## 2021-06-23 LAB — CYTOLOGY - NON PAP

## 2021-06-23 LAB — PROCALCITONIN: Procalcitonin: 4.88 ng/mL

## 2021-06-23 MED ORDER — PENTAFLUOROPROP-TETRAFLUOROETH EX AERO: 1.0000 "application " | INHALATION_SPRAY | CUTANEOUS | Status: DC | PRN

## 2021-06-23 MED ORDER — SODIUM CHLORIDE 0.9 % IV SOLN: 100.0000 mL | INTRAVENOUS | Status: DC | PRN

## 2021-06-23 MED ORDER — ALTEPLASE 2 MG IJ SOLR: 2.0000 mg | Freq: Once | INTRAMUSCULAR | Status: DC | PRN

## 2021-06-23 MED ORDER — VANCOMYCIN HCL 750 MG/150ML IV SOLN
INTRAVENOUS | Status: AC
  Administered 2021-06-23: 750 mg
  Filled 2021-06-23: qty 150

## 2021-06-23 MED ORDER — LIDOCAINE-PRILOCAINE 2.5-2.5 % EX CREA: 1.0000 "application " | TOPICAL_CREAM | CUTANEOUS | Status: DC | PRN

## 2021-06-23 MED ORDER — LIDOCAINE HCL (PF) 1 % IJ SOLN: 5.0000 mL | INTRAMUSCULAR | Status: DC | PRN

## 2021-06-23 MED ORDER — HEPARIN SODIUM (PORCINE) 1000 UNIT/ML DIALYSIS
1000.0000 [IU] | INTRAMUSCULAR | Status: DC | PRN
  Filled 2021-06-23: qty 1

## 2021-06-23 NOTE — Progress Notes (Signed)
[facility] ROUNDING NOTE   Subjective:   Interval History: This is a 65 year old gentleman with a history of heart transplantation at [facility] May [id number] complicated by prolonged respiratory failure requiring tracheostomy PEG placement complicated by perforation and exploratory laparotomy x2.  He is also end-stage renal disease and presented with right pleural effusion and suspected right lower lobe pneumonia.  Successful dialysis 06/21/2021 with 1.2 L removed.  Next dialysis is 06/23/2021  Blood pressure 101/63 pulse 110 temperature 97.5 O2 sats 100% 2 L nasal cannula  Sodium 137 potassium 4.2 chloride 99 CO2 27 calcium 8.6 BUN 45 creatinine 8.7 glucose 114 albumin 2.6 phosphorus 6.5 hemoglobin 7.8   OP HD: [facility] MWF   4h  400/500   67kg  2/2 bath  TDC  /RU HeRO (not sure when ready to use)  Hep 2000  - mircera 75 q2, last 8/17  - calcitriol 0.75 po tiw   Objective:  Vital signs in last 24 hours:  Temp:  [97.5 F (36.4 C)-99 F (37.2 C)] 97.5 F (36.4 C) (08/31 0832) Pulse Rate:  [98-114] 110 (08/31 0930) Resp:  [14-34] 30 (08/31 0930) BP: (103-151)/(47-75) 107/63 (08/31 0930) SpO2:  [97 %-100 %] 100 % (08/31 0930) Weight:  [65.1 kg-70.8 kg] 65.1 kg (08/31 0832)  Weight change: 2.2 kg Filed Weights   06/21/21 1639 06/23/21 0338 06/23/21 0832  Weight: 67.4 kg 70.8 kg 65.1 kg    Intake/Output: I/O last 3 completed shifts: In: 470 [P.O.:120; IV Piggyback:350] Out: 0    Intake/Output this shift:  No intake/output data recorded. Gen alert, no distress, somewhat frail No rash, cyanosis or gangrene Sclera anicteric, throat clear  No jvd or bruits Chest clear bilat to bases, no rales/ wheezing RRR no MRG Abd soft ntnd no mass or ascites +bs GU normal male MS no joint effusions or deformity Ext no LE or UE edema, no wounds or ulcers Neuro is alert, Ox 3 , nf, gen weakness  RIJ TDC/ RUA HeRO AVG+bruit   Basic Metabolic Panel: Recent Labs  Lab 06/19/21 1741  06/20/21 0325 06/21/21 0214 06/23/21 0846  NA 139 137 136 137  K 4.3 4.5 4.6 4.2  CL 97* 96* 95* 99  CO2 '30 26 25 27  '$ GLUCOSE 101* 91 100* 114*  BUN 24* 31* 45* 45*  CREATININE 5.76* 6.40* 8.34* 8.70*  CALCIUM 8.8* 8.6* 8.6* 8.6*  MG  --  1.8  --   --   PHOS  --  4.5  --  6.5*     Liver Function Tests: Recent Labs  Lab 06/19/21 1741 06/21/21 0214 06/22/21 1529 06/23/21 0846  AST 16 11*  --   --   ALT 10 8  --   --   ALKPHOS 75 60  --   --   BILITOT 0.8 0.9  --   --   PROT 6.6 6.2* 6.1*  --   ALBUMIN 3.6 2.9*  --  2.6*    Recent Labs  Lab 06/19/21 1741  LIPASE 69*    No results for input(s): AMMONIA in the last 168 hours.  CBC: Recent Labs  Lab 06/19/21 1741 06/20/21 0325 06/21/21 0214 06/22/21 0119 06/23/21 0142  WBC 9.3 10.3 9.4 7.6 6.3  NEUTROABS 6.9  --   --   --   --   HGB 8.7* 8.2* 7.6* 7.2* 7.8*  HCT 30.0* 27.3* 26.5* 23.7* 26.9*  MCV 101.7* 101.1* 101.1* 98.3 101.9*  PLT 175 142* 134* 134* 144*  Cardiac Enzymes: No results for input(s): CKTOTAL, CKMB, CKMBINDEX, TROPONINI in the last 168 hours.  BNP: Invalid input(s): POCBNP  CBG: No results for input(s): GLUCAP in the last 168 hours.  Microbiology: Results for orders placed or performed during the hospital encounter of 06/19/21  Resp Panel by RT-PCR (Flu A&B, Covid) Nasopharyngeal Swab     Status: None   Collection Time: 06/19/21  5:34 PM   Specimen: Nasopharyngeal Swab; Nasopharyngeal(NP) swabs in vial transport medium  Result Value Ref Range Status   SARS Coronavirus 2 by RT PCR NEGATIVE NEGATIVE Final    Comment: (NOTE) SARS-CoV-2 target nucleic acids are NOT DETECTED.  The SARS-CoV-2 RNA is generally detectable in upper respiratory specimens during the acute phase of infection. The lowest concentration of SARS-CoV-2 viral copies this assay can detect is 138 copies/mL. A negative result does not preclude SARS-Cov-2 infection and should not be used as the sole basis for  treatment or other patient management decisions. A negative result may occur with  improper specimen collection/handling, submission of specimen other than nasopharyngeal swab, presence of viral mutation(s) within the areas targeted by this assay, and inadequate number of viral copies(<138 copies/mL). A negative result must be combined with clinical observations, patient history, and epidemiological information. The expected result is Negative.  Fact Sheet for Patients:  EntrepreneurPulse.com.au  Fact Sheet for Healthcare Providers:  IncredibleEmployment.be  This test is no t yet approved or cleared by the [place] FDA and  has been authorized for detection and/or diagnosis of SARS-CoV-2 by FDA under an Emergency Use Authorization (EUA). This EUA will remain  in effect (meaning this test can be used) for the duration of the COVID-19 declaration under Section 564(b)(1) of the Act, 21 U.S.C.section 360bbb-3(b)(1), unless the authorization is terminated  or revoked sooner.       Influenza A by PCR NEGATIVE NEGATIVE Final   Influenza B by PCR NEGATIVE NEGATIVE Final    Comment: (NOTE) The Xpert Xpress SARS-CoV-2/FLU/RSV plus assay is intended as an aid in the diagnosis of influenza from Nasopharyngeal swab specimens and should not be used as a sole basis for treatment. Nasal washings and aspirates are unacceptable for Xpert Xpress SARS-CoV-2/FLU/RSV testing.  Fact Sheet for Patients: EntrepreneurPulse.com.au  Fact Sheet for Healthcare Providers: IncredibleEmployment.be  This test is not yet approved or cleared by the [place] FDA and has been authorized for detection and/or diagnosis of SARS-CoV-2 by FDA under an Emergency Use Authorization (EUA). This EUA will remain in effect (meaning this test can be used) for the duration of the COVID-19 declaration under Section 564(b)(1) of the Act, 21  U.S.C. section 360bbb-3(b)(1), unless the authorization is terminated or revoked.  Performed at [facility], [place] [address], [place], [place] [address]   Blood culture (routine x 2)     Status: None (Preliminary result)   Collection Time: 06/19/21  7:14 PM   Specimen: BLOOD LEFT HAND  Result Value Ref Range Status   Specimen Description BLOOD LEFT HAND  Final   Special Requests   Final    BOTTLES DRAWN AEROBIC AND ANAEROBIC Blood Culture results may not be optimal due to an inadequate volume of blood received in culture bottles   Culture   Final    NO GROWTH 3 DAYS Performed at [facility], [place] [address] [place], [place] [address]    Report Status PENDING  Incomplete  Body fluid culture w Gram Stain     Status: None (Preliminary result)  Collection Time: 06/22/21  2:13 PM   Specimen: Body Fluid  Result Value Ref Range Status   Specimen Description FLUID PLEURAL RIGHT  Final   Special Requests Immunocompromised  Final   Gram Stain   Final    FEW SQUAMOUS EPITHELIAL CELLS PRESENT FEW WBC SEEN NO ORGANISMS SEEN    Culture   Final    NO GROWTH < 24 HOURS Performed at [facility], [place] [address], [place], [place] [address]    Report Status PENDING  Incomplete    Coagulation Studies: No results for input(s): LABPROT, INR in the last 72 hours.  Urinalysis: No results for input(s): COLORURINE, LABSPEC, PHURINE, GLUCOSEU, HGBUR, BILIRUBINUR, KETONESUR, PROTEINUR, UROBILINOGEN, NITRITE, LEUKOCYTESUR in the last 72 hours.  Invalid input(s): APPERANCEUR     Imaging: DG Chest 2 View  Result Date: 06/21/2021 CLINICAL DATA:  Pneumonia and fever. EXAM: CHEST - 2 VIEW COMPARISON:  Chest radiograph dated 06/19/2021. Chest CT dated 06/19/2021. FINDINGS: Left-sided dialysis catheter as well as a tunnel catheter on the right side in similar position. Interval increase in the size of the right pleural effusion with associated increase in right lung opacity compared  to prior radiograph. Small left pleural effusion and left lung base atelectasis or infiltrate. No pneumothorax. Stable cardiomediastinal silhouette median sternotomy wires. No acute osseous pathology. IMPRESSION: Interval increase in the size of the right pleural effusion with associated increase in right lung opacity since the prior radiograph. Electronically Signed   By: Anner Crete M.D.   On: 06/21/2021 20:17   DG Chest Port 1 View  Result Date: 06/22/2021 CLINICAL DATA:  Status post thoracentesis EXAM: PORTABLE CHEST 1 VIEW COMPARISON:  Chest x-ray 06/22/2021 6:13 a.m., CT chest 06/19/2021 FINDINGS: Stable left chest wall dialysis catheter with tip in the region of the cavoatrial junction. Right-sided hera cath tip with tip terminating overlying the right atrium. The heart and mediastinal contours are unchanged. Patchy airspace opacities within the right lower lung and left lower lobe. Redemonstration of left upper lobe linear atelectasis. No pulmonary edema. Interval decrease in size of a now trace to small volume residual right pleural effusion. Persistent trace, possibly loculated, left pleural effusion. No pneumothorax. No acute osseous abnormality. IMPRESSION: 1. Interval decrease in size of a now trace to small volume residual right pleural effusion. Persistent trace, possibly loculated, left pleural effusion. 2. Patchy bilateral lower lobe airspace opacities may represent atelectasis versus infection/inflammation. Electronically Signed   By: Iven Finn M.D.   On: 06/22/2021 15:19   DG CHEST PORT 1 VIEW  Result Date: 06/22/2021 CLINICAL DATA:  Follow-up right pleural effusion EXAM: PORTABLE CHEST 1 VIEW COMPARISON:  06/21/2021 FINDINGS: Cardiac shadow is stable but enlarged. Postsurgical changes are noted. Left-sided dialysis catheter and right-sided hero graft are again noted and stable. Bilateral pleural effusions are again noted right greater than left with associated atelectatic  changes. The overall appearance is similar to that seen on the previous day. No acute abnormality noted. IMPRESSION: No significant change from the previous day. Bilateral effusions and atelectatic changes are seen. Electronically Signed   By: Inez Catalina M.D.   On: 06/22/2021 08:54   VAS [place] LOWER EXTREMITY VENOUS (DVT)  Result Date: 06/21/2021  Lower Venous DVT Study Patient Name:  MORDECAI OO  Date of Exam:   06/21/2021 Medical Rec #: [id number]      Accession #:    [id number] Date of Birth: [birth year]      Patient Gender: M Patient Age:   75  years Exam Location:  [facility] Procedure:      VAS [place] LOWER EXTREMITY VENOUS (DVT) Referring Phys: JEFFREY MCCLUNG --------------------------------------------------------------------------------  Indications: "Suspected DVT" - per MD order. Other Indications: Elevated D-dimer & tachycardia in patient with sepsis. Comparison Study: Previous exam '@[facility]'$  04/15/2020 - negative for DVT Performing Technologist: Jody Hill RVT, RDMS  Examination Guidelines: A complete evaluation includes B-mode imaging, spectral Doppler, color Doppler, and power Doppler as needed of all accessible portions of each vessel. Bilateral testing is considered an integral part of a complete examination. Limited examinations for reoccurring indications may be performed as noted. The reflux portion of the exam is performed with the patient in reverse Trendelenburg.  +---------+---------------+---------+-----------+----------+--------------+ RIGHT    CompressibilityPhasicitySpontaneityPropertiesThrombus Aging +---------+---------------+---------+-----------+----------+--------------+ CFV      Full           Yes      Yes                                 +---------+---------------+---------+-----------+----------+--------------+ SFJ      Full                                                        +---------+---------------+---------+-----------+----------+--------------+ FV Prox   Full           Yes      Yes                                 +---------+---------------+---------+-----------+----------+--------------+ FV Mid   Full           Yes      Yes                                 +---------+---------------+---------+-----------+----------+--------------+ FV DistalFull           Yes      Yes                                 +---------+---------------+---------+-----------+----------+--------------+ PFV      Full                                                        +---------+---------------+---------+-----------+----------+--------------+ POP      Full           Yes      Yes                                 +---------+---------------+---------+-----------+----------+--------------+ PTV      Full                                                        +---------+---------------+---------+-----------+----------+--------------+ PERO     Full                                                        +---------+---------------+---------+-----------+----------+--------------+   +---------+---------------+---------+-----------+----------+--------------+  LEFT     CompressibilityPhasicitySpontaneityPropertiesThrombus Aging +---------+---------------+---------+-----------+----------+--------------+ CFV      Full           Yes      Yes                                 +---------+---------------+---------+-----------+----------+--------------+ SFJ      Full                                                        +---------+---------------+---------+-----------+----------+--------------+ FV Prox  Full           Yes      Yes                                 +---------+---------------+---------+-----------+----------+--------------+ FV Mid   Full           Yes      Yes                                 +---------+---------------+---------+-----------+----------+--------------+ FV DistalFull           Yes      Yes                                  +---------+---------------+---------+-----------+----------+--------------+ PFV      Full                                                        +---------+---------------+---------+-----------+----------+--------------+ POP      Full           Yes      Yes                                 +---------+---------------+---------+-----------+----------+--------------+ PTV      Full                                                        +---------+---------------+---------+-----------+----------+--------------+ PERO     Full                                                        +---------+---------------+---------+-----------+----------+--------------+     Summary: BILATERAL: - No evidence of deep vein thrombosis seen in the lower extremities, bilaterally. - No evidence of superficial venous thrombosis in the lower extremities, bilaterally. -No evidence of popliteal cyst, bilaterally.   *See table(s) above for measurements and observations. Electronically signed by Servando Snare MD on 06/21/2021 at 5:33:34 PM.    Final  Medications:    sodium chloride     sodium chloride     meropenem (MERREM) IV 500 mg (06/22/21 2059)   vancomycin 750 mg (06/21/21 1618)    atorvastatin  20 mg Oral q morning   calcitRIOL  0.75 mcg Oral Q M,W,F-HD   calcium carbonate  1,250 mg Oral QPC breakfast   Chlorhexidine Gluconate Cloth  6 each Topical Q0600   Chlorhexidine Gluconate Cloth  6 each Topical Q0600   darbepoetin (ARANESP) injection - DIALYSIS  60 mcg Intravenous Q Wed-HD   diltiazem  180 mg Oral QPM   heparin  2,000 Units Dialysis Once in dialysis   midodrine  10 mg Oral Q M,W,F-HD   mirtazapine  45 mg Oral QPM   mycophenolate  180 mg Oral BID   pantoprazole  40 mg Oral q morning   tacrolimus  6 mg Oral BID   sodium chloride, sodium chloride, acetaminophen **OR** acetaminophen, alteplase, heparin, levalbuterol, lidocaine (PF), lidocaine-prilocaine, ondansetron  **OR** ondansetron (ZOFRAN) IV, pentafluoroprop-tetrafluoroeth  Assessment/ Plan:  Fever/ sepsis - in immunocomprised pt, per pmd. Suspected lung source, on IV vanc and zosyn. Sig consolidation and/or effusion R lower/ middle lobe by CT.  Heart transplant - on prograf and cellcept, per pmd.  ESRD - on HD MWF. Has not missed. HeRO placed 05/15/21, ready to use 8/23.  Next dialysis 06/23/2021.  Patient currently receiving dialysis treatment. HTN /vol - BP's are wnl, cont home meds. No edema on exam, CXR / CT w/o edema. Small UF 1-2 L w/ next HD as tol.  A/C resp failure - on prn O2 at home, continuous here.  Anemia ckd - Hb 8's here, next esa due 8/31, have ordered   noted fairly low iron saturations.  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

## 2021-06-23 NOTE — Progress Notes (Signed)
[facility]   Reason for visit: Follow up on fever  Interval History: afebrile now > 24 hours; pleural fluid with no positive cultures to date, +/- exudative but cell count wnl, appearance grossly exudative.  700 mL removed.  Feels well.     Physical Exam: Constitutional:  Vitals:   06/23/21 0930 06/23/21 1000  BP: 107/63 115/64  Pulse: (!) 110 (!) 111  Resp: (!) 30 (!) 26  Temp:    SpO2: 100% 100%   patient appears in NAD Respiratory: Normal respiratory effort; CTA B Cardiovascular: RRR GI: soft, nt, nd  Review of Systems: Constitutional: negative for fevers and chills Gastrointestinal: negative for nausea and diarrhea Integument/breast: negative for rash  Lab Results  Component Value Date   WBC 6.3 06/23/2021   HGB 7.8 (L) 06/23/2021   HCT 26.9 (L) 06/23/2021   MCV 101.9 (H) 06/23/2021   PLT 144 (L) 06/23/2021    Lab Results  Component Value Date   CREATININE 8.70 (H) 06/23/2021   BUN 45 (H) 06/23/2021   NA 137 06/23/2021   K 4.2 06/23/2021   CL 99 06/23/2021   CO2 27 06/23/2021    Lab Results  Component Value Date   ALT 8 06/21/2021   AST 11 (L) 06/21/2021   ALKPHOS 60 06/21/2021     Microbiology: Recent Results (from the past 240 hour(s))  Resp Panel by RT-PCR (Flu A&B, Covid) Nasopharyngeal Swab     Status: None   Collection Time: 06/19/21  5:34 PM   Specimen: Nasopharyngeal Swab; Nasopharyngeal(NP) swabs in vial transport medium  Result Value Ref Range Status   SARS Coronavirus 2 by RT PCR NEGATIVE NEGATIVE Final    Comment: (NOTE) SARS-CoV-2 target nucleic acids are NOT DETECTED.  The SARS-CoV-2 RNA is generally detectable in upper respiratory specimens during the acute phase of infection. The lowest concentration of SARS-CoV-2 viral copies this assay can detect is 138 copies/mL. A negative result does not preclude SARS-Cov-2 infection and should not be used as the sole basis for treatment or other patient  management decisions. A negative result may occur with  improper specimen collection/handling, submission of specimen other than nasopharyngeal swab, presence of viral mutation(s) within the areas targeted by this assay, and inadequate number of viral copies(<138 copies/mL). A negative result must be combined with clinical observations, patient history, and epidemiological information. The expected result is Negative.  Fact Sheet for Patients:  EntrepreneurPulse.com.au  Fact Sheet for Healthcare Providers:  IncredibleEmployment.be  This test is no t yet approved or cleared by the [place] FDA and  has been authorized for detection and/or diagnosis of SARS-CoV-2 by FDA under an Emergency Use Authorization (EUA). This EUA will remain  in effect (meaning this test can be used) for the duration of the COVID-19 declaration under Section 564(b)(1) of the Act, 21 U.S.C.section 360bbb-3(b)(1), unless the authorization is terminated  or revoked sooner.       Influenza A by PCR NEGATIVE NEGATIVE Final   Influenza B by PCR NEGATIVE NEGATIVE Final    Comment: (NOTE) The Xpert Xpress SARS-CoV-2/FLU/RSV plus assay is intended as an aid in the diagnosis of influenza from Nasopharyngeal swab specimens and should not be used as a sole basis for treatment. Nasal washings and aspirates are unacceptable for Xpert Xpress SARS-CoV-2/FLU/RSV testing.  Fact Sheet for Patients: EntrepreneurPulse.com.au  Fact Sheet for Healthcare Providers: IncredibleEmployment.be  This test is not yet approved or cleared by the [place] and has been authorized  for detection and/or diagnosis of SARS-CoV-2 by FDA under an Emergency Use Authorization (EUA). This EUA will remain in effect (meaning this test can be used) for the duration of the COVID-19 declaration under Section 564(b)(1) of the Act, 21 U.S.C. section 360bbb-3(b)(1),  unless the authorization is terminated or revoked.  Performed at [facility], [place] [address] [place], [place] [address]   Blood culture (routine x 2)     Status: None (Preliminary result)   Collection Time: 06/19/21  7:14 PM   Specimen: BLOOD LEFT HAND  Result Value Ref Range Status   Specimen Description BLOOD LEFT HAND  Final   Special Requests   Final    BOTTLES DRAWN AEROBIC AND ANAEROBIC Blood Culture results may not be optimal due to an inadequate volume of blood received in culture bottles   Culture   Final    NO GROWTH 4 DAYS Performed at [facility], [place] [address] [place], [place] [address]    Report Status PENDING  Incomplete  Body fluid culture w Gram Stain     Status: None (Preliminary result)   Collection Time: 06/22/21  2:13 PM   Specimen: Body Fluid  Result Value Ref Range Status   Specimen Description FLUID PLEURAL RIGHT  Final   Special Requests Immunocompromised  Final   Gram Stain   Final    FEW SQUAMOUS EPITHELIAL CELLS PRESENT FEW WBC SEEN NO ORGANISMS SEEN    Culture   Final    NO GROWTH < 24 HOURS Performed at [facility], [place] [address], [place], [place] [address]    Report Status PENDING  Incomplete    Impression/Plan:  1. Pneumonia -he has had increased oxygen requirements, cough and concerns for pneumonia on CT c/w pneumonia.  He is improving on empiric treatment and no changes.    2.  Esrd - patient seen on hemodialysis today.  Tolerating well.   3.  Fever - fever curve trending down and afebrile > 36 hours.    4.  Pleural effusion - no positive growth to date and will continue to monitor.

## 2021-06-23 NOTE — Plan of Care (Signed)
  Problem: Education: Goal: Knowledge of General Education information will improve Description Including pain rating scale, medication(s)/side effects and non-pharmacologic comfort measures Outcome: Progressing   

## 2021-06-23 NOTE — Progress Notes (Signed)
   NAME:  Rickey Erickson, MRN:  [id number], DOB:  1956-04-28, LOS: 4 ADMISSION DATE:  06/19/2021, CONSULTATION DATE:  8/30 REFERRING MD:  Algis Liming , CHIEF COMPLAINT:  Pleural effusion   History of Present Illness:  This is a 65 yom who was admitted 8/27 w/ fever (102), dyspnea (+) SIRS/sepsis w/ working Dx of CAP w/ right effusion. Cultures sent. Vanc/zosyn started.   Pertinent  Medical History  Prior Heart transplant ([facility] 2021), prior Trach & PEG w/ perf req exploration x 2, ESRD on HD (MWF)  Significant Hospital Events: Including procedures, antibiotic start and stop dates in addition to other pertinent events   8/27 admitted. Vanc/zosyn started. CT chest: 1. Moderate right pleural effusion with compressive atelectasis of the majority of the right lower lobe and right middle lobe and partial compression of the right upper lobe. Pneumonia is not excluded.  Small left pleural effusion with partial compressive atelectasis of the left lower lobe. Masslike subpleural consolidation in the lingula with adjacent surgical material may represent an area of postoperative change/scarring although a mass is not excluded. Mildly enlarged subcarinal lymph node. 8/29 [facility] accepted for transfer (prior transplant). Pulm consulted for Pleural effusion.  8/30 Thoracentesis: 700 ml removed: grossly exudate appearing 8/31 (S) protein: 6.1, (S) LDH: 161 Pleural fluid analysis (boarderline exudate by light's criteria) LDH: 101, protein: 3.6, fluid amylase: 36, glucose 10-->cultures pending, cytology pending.  Interim History / Subjective:  Skip feels well.  He is in no acute distress today.  Currently undergoing dialysis tolerating this well work of breathing improved  Objective   Blood pressure 113/60, pulse 98, temperature 98.7 F (37.1 C), temperature source Oral, resp. rate 14, height '5\' 8"'$  (1.727 m), weight 70.8 kg, SpO2 100 %.        Intake/Output Summary (Last 24 hours) at 06/23/2021 0743 Last data filed at  06/23/2021 [id number] Gross per 24 hour  Intake 470 ml  Output 0 ml  Net 470 ml   Filed Weights   06/21/21 1315 06/21/21 1639 06/23/21 0338  Weight: 68.6 kg 67.4 kg 70.8 kg    Examination: General: Pleasant chronically ill 65 year old white male currently resting in bed he is in no acute distress this morning HENT: Normocephalic atraumatic no jugular venous distention appreciated mucous membranes are moist Lungs: Diminished bases no accessory use currently on supplemental oxygen at 2 L/min Cardiovascular: Regular Abdomen: Soft nontender Extremities: Warm and dry Neuro: Awake oriented no focal deficits GU: An uric  Resolved Hospital Problem list     Assessment & Plan:   CAP (NOS)  exudative pleural effusion by light's criteria. Prior Heart transplant ([facility] 2021) prior Trach & PEG w/ perf req exploration x 2, ESRD on HD (MWF) Prior h/o bacteremia  HLD HTN SIRS/Sepsis Anemia of chronic disease.  Thrombocytopenia    Pulmonary Problem List   CAP (NOS in an immunocompromised host) w/ boarderline exudative pleural effusion by light's criteria. At best favor parapneumonic process PCXR improved aeration (on right). On going LLL airspace disease. Left effusion.  Plan/rec Cont broad spec abx (day 5; currently day 3 meropenem and day 5 vanc) Volume removal w/ HD Await cultures and cytology as well as AFB Repeat cxr am  We will see again in the morning to follow-up on chest x-ray   Best Practice (right click and "Reselect all SmartList Selections" daily)   Per primary     ,jh

## 2021-06-23 NOTE — Progress Notes (Signed)
PROGRESS NOTE    [place]  [id number] DOB: Jun 24, 1956 DOA: 06/19/2021 PCP: Rolene Course, MD    Brief Narrative:  Rickey Erickson  Is a 65 year old male with a past medical history of heart transplant at [facility] May [id number] complicated by prolonged respiratory failure requiring tracheostomy (since decannulated), lung/diaphragm injury, multi microbial PNA, PEG tube complicated by perforation requiring exploratory laparotomy x2 with subsequent GJ placement, and ESRD who presented to the ED 8/27 with generalized malaise, progressive hypoxia, and fever to 102.    In the ED he was found to have a heart rate of 128 with respiratory rate of 23 and saturation 93% on nasal cannula support.  Potassium was 4.3.  CXR demonstrated moderate bilateral lower lobe atelectasis versus infiltrates with an air-fluid level in the right LL possibly representing a cavitary lesion.  The EDP contacted [facility] who in principal agreed to accept the patient but was unable to do so due to no beds being available.   Assessment & Plan:   Active Problems:   Sepsis ([place])   ESRD (end stage renal disease) ([place])   Essential hypertension   Status post heart transplantation ([place])   HLD (hyperlipidemia)   Fever   Acute respiratory failure with hypoxia (H[facility])   Immunocompromised patient ([place])   Sinus tachycardia   Pleural effusion   Community acquired pneumonia of right lower lobe of lung   Sepsis, POA Right lower lobe pneumonia Patient presenting to ED with generalized malaise, shortness of breath and fever to 102 F.  Patient with history of heart transplant in immunocompromise state. Chest x-ray with mild/moderate bilateral lower lobe atelectasis with infiltrate, air-fluid level overlying right lung.  CT chest without contrast with moderate right pleural effusion with compressive atelectasis right lower lobe/right middle lobe and partial compression of the right upper lobe. --PCCM and ID following, appreciate  assistance --Blood cultures x2: No growth x4 days --Pleural fluid culture: No growth x24 hours (no organisms seen on gram stain) --Pleural fluid acid-fast smear/culture: Pending --Pleural fluid fungus culture: Pending --Meropenem 500 mg IV q24h --Vancomycin 750 mg IV on MWF with HD  Moderate right pleural effusion  Imaging notable for compressive atelectasis of RLL and RML and partial compression of the right upper lobe. PCCM consulted, and patient underwent ultrasound-guided thoracentesis on 8/30 with 700 mL of [place]/cloudy appearing fluid drained.  Pleural fluid studies consistent with exudative effusion; likely secondary to pneumonia.  Cytology with no malignant cells seen. --Continue antibiotics as above --PCCM repeating chest x-ray tomorrow --Continue to follow cultures as above --Continue supplemental oxygen, maintain SPO2 > 92%, on 2 L [place] with SPO2 100% this morning   Persistent sinus tachycardia  [place]key simply due to acute infection and fever - no chest pain - d-dimer elevated, but this may simply be due to infection -bilateral lower extremity venous Dopplers negative for DVT or SVT- low risk as no other sx to suggest PE therefore will not check CTa chest yet or begin full dose anticoag   Status post heart transplant May 2021 Discussed with Cardiology at [facility] on 8/29; awaiting bed availability for transfer --Mycophenolate 180 mg p.o. twice daily --Tacrolimus 6 mg p.o. twice daily   Acute on chronic hypoxic respiratory failure Typically requires O2 support at night at baseline. --Continue supplemental oxygen, maintain SPO2 greater than 92%, on 2 L nasal cannula this morning.     ESRD - HD M/W/F Nephrology following, appreciate assistance.  Received HD via left TDC.  Right upper arm HeRO.  --  HD today --Continue midodrine 10 mg every MWF with HD   HTN --Continue diltiazem CD1 180 mg p.o. nightly.  HLD: Atorvastatin 20 mg p.o. daily   Acute on chronic  anemia Hemoglobin at [facility] on 8/4: 8.7.   Hemoglobin 8.7>7.6 >7.2>7.8, stable    Thrombocytopenia Likely due to acute illness/infectious etiology or antibiotics.   --Plt 175>142>134>144   DVT prophylaxis: SCDs Start: 06/19/21 2118   Code Status: Full Code Family Communication: No family present at bedside this morning.  Disposition Plan:  Level of care: Progressive Status is: Inpatient  Remains inpatient appropriate because:Ongoing diagnostic testing needed not appropriate for outpatient work up, Unsafe d/c plan, IV treatments appropriate due to intensity of illness or inability to take PO, and Inpatient level of care appropriate due to severity of illness  Dispo: The patient is from: Home              Anticipated d/c is to:  Pending transfer to [facility]              Patient currently is not medically stable to d/c.   Difficult to place patient No   Consultants:  PCCM Nephrology Infectious disease  Procedures:  Thoracentesis 8/30  Antimicrobials:  Vancomycin 8/27>> Meropenem 8/29>> Zosyn 8/27 - 8/29    Subjective: Patient seen examined at bedside, resting comfortably.  Currently in hemodialysis.  States breathing is slightly improved today.  No other complaints or concerns at this time.  Denies headache, no fever/chills/night sweats, no nausea/vomiting/diarrhea, no chest pain, no palpitations, no abdominal pain, no weakness, no fatigue, no paresthesias.  No acute events overnight per nurse staff.  Objective: Vitals:   06/23/21 1314 06/23/21 1400 06/23/21 1500 06/23/21 1600  BP: 99/67 105/65 118/65 118/63  Pulse: (!) 112 (!) 110 (!) 109 (!) 113  Resp: (!) 27 (!) 28 (!) 26 (!) 25  Temp: 98 F (36.7 C) 98.3 F (36.8 C) 98.6 F (37 C) 99.5 F (37.5 C)  TempSrc: Oral Oral Oral Oral  SpO2: 96% 91% 93% 91%  Weight:      Height:        Intake/Output Summary (Last 24 hours) at 06/23/2021 1717 Last data filed at 06/23/2021 1507 Gross per 24 hour  Intake 571.17  ml  Output 0 ml  Net 571.17 ml   Filed Weights   06/21/21 1639 06/23/21 0338 06/23/21 0832  Weight: 67.4 kg 70.8 kg 65.1 kg    Examination:  General exam: Appears calm and comfortable  Respiratory system: Clear to auscultation. Respiratory effort normal.  On 2 L nasal cannula with SPO2 100% Cardiovascular system: S1 & S2 heard, RRR. No JVD, murmurs, rubs, gallops or clicks. No pedal edema. Gastrointestinal system: Abdomen is nondistended, soft and nontender. No organomegaly or masses felt. Normal bowel sounds heard. Central nervous system: Alert and oriented. No focal neurological deficits. Extremities: Symmetric 5 x 5 power. Skin: No rashes, lesions or ulcers Psychiatry: Judgement and insight appear normal. Mood & affect appropriate.     Data Reviewed: I have personally reviewed following labs and imaging studies  CBC: Recent Labs  Lab 06/19/21 1741 06/20/21 0325 06/21/21 0214 06/22/21 0119 06/23/21 0142  WBC 9.3 10.3 9.4 7.6 6.3  NEUTROABS 6.9  --   --   --   --   HGB 8.7* 8.2* 7.6* 7.2* 7.8*  HCT 30.0* 27.3* 26.5* 23.7* 26.9*  MCV 101.7* 101.1* 101.1* 98.3 101.9*  PLT 175 142* 134* 134* [id number]*   Basic Metabolic Panel: Recent Labs  Lab 06/19/21 1741 06/20/21 0325 06/21/21 0214 06/23/21 0846  NA 139 137 136 137  K 4.3 4.5 4.6 4.2  CL 97* 96* 95* 99  CO2 '30 26 25 27  '$ GLUCOSE 101* 91 100* 114*  BUN 24* 31* 45* 45*  CREATININE 5.76* 6.40* 8.34* 8.70*  CALCIUM 8.8* 8.6* 8.6* 8.6*  MG  --  1.8  --   --   PHOS  --  4.5  --  6.5*   GFR: Estimated Creatinine Clearance: 8 mL/min (A) (by C-G formula based on SCr of 8.7 mg/dL (H)). Liver Function Tests: Recent Labs  Lab 06/19/21 1741 06/21/21 0214 06/22/21 1529 06/23/21 0846  AST 16 11*  --   --   ALT 10 8  --   --   ALKPHOS 75 60  --   --   BILITOT 0.8 0.9  --   --   PROT 6.6 6.2* 6.1*  --   ALBUMIN 3.6 2.9*  --  2.6*   Recent Labs  Lab 06/19/21 1741  LIPASE 69*   No results for input(s): AMMONIA in  the last 168 hours. Coagulation Profile: No results for input(s): INR, PROTIME in the last 168 hours. Cardiac Enzymes: No results for input(s): CKTOTAL, CKMB, CKMBINDEX, TROPONINI in the last 168 hours. BNP (last 3 results) No results for input(s): PROBNP in the last 8760 hours. HbA1C: No results for input(s): HGBA1C in the last 72 hours. CBG: No results for input(s): GLUCAP in the last 168 hours. Lipid Profile: No results for input(s): CHOL, HDL, LDLCALC, TRIG, CHOLHDL, LDLDIRECT in the last 72 hours. Thyroid Function Tests: No results for input(s): TSH, T4TOTAL, FREET4, T3FREE, THYROIDAB in the last 72 hours. Anemia Panel: Recent Labs    06/21/21 0214  TIBC 144*  IRON 13*   Sepsis Labs: Recent Labs  Lab 06/19/21 1741 06/19/21 1914 06/21/21 1827 06/22/21 0119 06/23/21 0142  PROCALCITON  --   --  5.37 5.57 4.88  LATICACIDVEN 1.8 1.9  --   --   --     Recent Results (from the past 240 hour(s))  Resp Panel by RT-PCR (Flu A&B, Covid) Nasopharyngeal Swab     Status: None   Collection Time: 06/19/21  5:34 PM   Specimen: Nasopharyngeal Swab; Nasopharyngeal(NP) swabs in vial transport medium  Result Value Ref Range Status   SARS Coronavirus 2 by RT PCR NEGATIVE NEGATIVE Final    Comment: (NOTE) SARS-CoV-2 target nucleic acids are NOT DETECTED.  The SARS-CoV-2 RNA is generally detectable in upper respiratory specimens during the acute phase of infection. The lowest concentration of SARS-CoV-2 viral copies this assay can detect is 138 copies/mL. A negative result does not preclude SARS-Cov-2 infection and should not be used as the sole basis for treatment or other patient management decisions. A negative result may occur with  improper specimen collection/handling, submission of specimen other than nasopharyngeal swab, presence of viral mutation(s) within the areas targeted by this assay, and inadequate number of viral copies(<138 copies/mL). A negative result must be  combined with clinical observations, patient history, and epidemiological information. The expected result is Negative.  Fact Sheet for Patients:  EntrepreneurPulse.com.au  Fact Sheet for Healthcare Providers:  IncredibleEmployment.be  This test is no t yet approved or cleared by the [place] FDA and  has been authorized for detection and/or diagnosis of SARS-CoV-2 by FDA under an Emergency Use Authorization (EUA). This EUA will remain  in effect (meaning this test can be used) for the duration of the COVID-19  declaration under Section 564(b)(1) of the Act, 21 U.S.C.section 360bbb-3(b)(1), unless the authorization is terminated  or revoked sooner.       Influenza A by PCR NEGATIVE NEGATIVE Final   Influenza B by PCR NEGATIVE NEGATIVE Final    Comment: (NOTE) The Xpert Xpress SARS-CoV-2/FLU/RSV plus assay is intended as an aid in the diagnosis of influenza from Nasopharyngeal swab specimens and should not be used as a sole basis for treatment. Nasal washings and aspirates are unacceptable for Xpert Xpress SARS-CoV-2/FLU/RSV testing.  Fact Sheet for Patients: EntrepreneurPulse.com.au  Fact Sheet for Healthcare Providers: IncredibleEmployment.be  This test is not yet approved or cleared by the [place] FDA and has been authorized for detection and/or diagnosis of SARS-CoV-2 by FDA under an Emergency Use Authorization (EUA). This EUA will remain in effect (meaning this test can be used) for the duration of the COVID-19 declaration under Section 564(b)(1) of the Act, 21 U.S.C. section 360bbb-3(b)(1), unless the authorization is terminated or revoked.  Performed at [facility], [place] [address], [place], [place] [address]   Blood culture (routine x 2)     Status: None (Preliminary result)   Collection Time: 06/19/21  7:14 PM   Specimen: BLOOD LEFT HAND  Result Value Ref Range Status    Specimen Description BLOOD LEFT HAND  Final   Special Requests   Final    BOTTLES DRAWN AEROBIC AND ANAEROBIC Blood Culture results may not be optimal due to an inadequate volume of blood received in culture bottles   Culture   Final    NO GROWTH 4 DAYS Performed at [facility], [place] [address], [place], [place] [address]    Report Status PENDING  Incomplete  Body fluid culture w Gram Stain     Status: None (Preliminary result)   Collection Time: 06/22/21  2:13 PM   Specimen: Body Fluid  Result Value Ref Range Status   Specimen Description FLUID PLEURAL RIGHT  Final   Special Requests Immunocompromised  Final   Gram Stain   Final    FEW SQUAMOUS EPITHELIAL CELLS PRESENT FEW WBC SEEN NO ORGANISMS SEEN    Culture   Final    NO GROWTH < 24 HOURS Performed at [facility], [place] [address], [place], [place] [address]    Report Status PENDING  Incomplete         Radiology Studies: DG Chest 2 View  Result Date: 06/21/2021 CLINICAL DATA:  Pneumonia and fever. EXAM: CHEST - 2 VIEW COMPARISON:  Chest radiograph dated 06/19/2021. Chest CT dated 06/19/2021. FINDINGS: Left-sided dialysis catheter as well as a tunnel catheter on the right side in similar position. Interval increase in the size of the right pleural effusion with associated increase in right lung opacity compared to prior radiograph. Small left pleural effusion and left lung base atelectasis or infiltrate. No pneumothorax. Stable cardiomediastinal silhouette median sternotomy wires. No acute osseous pathology. IMPRESSION: Interval increase in the size of the right pleural effusion with associated increase in right lung opacity since the prior radiograph. Electronically Signed   By: Anner Crete M.D.   On: 06/21/2021 20:17   DG Chest Port 1 View  Result Date: 06/22/2021 CLINICAL DATA:  Status post thoracentesis EXAM: PORTABLE CHEST 1 VIEW COMPARISON:  Chest x-ray 06/22/2021 6:13 a.m., CT chest 06/19/2021 FINDINGS:  Stable left chest wall dialysis catheter with tip in the region of the cavoatrial junction. Right-sided hera cath tip with tip terminating overlying the right atrium. The heart  and mediastinal contours are unchanged. Patchy airspace opacities within the right lower lung and left lower lobe. Redemonstration of left upper lobe linear atelectasis. No pulmonary edema. Interval decrease in size of a now trace to small volume residual right pleural effusion. Persistent trace, possibly loculated, left pleural effusion. No pneumothorax. No acute osseous abnormality. IMPRESSION: 1. Interval decrease in size of a now trace to small volume residual right pleural effusion. Persistent trace, possibly loculated, left pleural effusion. 2. Patchy bilateral lower lobe airspace opacities may represent atelectasis versus infection/inflammation. Electronically Signed   By: Iven Finn M.D.   On: 06/22/2021 15:19   DG CHEST PORT 1 VIEW  Result Date: 06/22/2021 CLINICAL DATA:  Follow-up right pleural effusion EXAM: PORTABLE CHEST 1 VIEW COMPARISON:  06/21/2021 FINDINGS: Cardiac shadow is stable but enlarged. Postsurgical changes are noted. Left-sided dialysis catheter and right-sided hero graft are again noted and stable. Bilateral pleural effusions are again noted right greater than left with associated atelectatic changes. The overall appearance is similar to that seen on the previous day. No acute abnormality noted. IMPRESSION: No significant change from the previous day. Bilateral effusions and atelectatic changes are seen. Electronically Signed   By: Inez Catalina M.D.   On: 06/22/2021 08:54        Scheduled Meds:  atorvastatin  20 mg Oral q morning   calcitRIOL  0.75 mcg Oral Q M,W,F-HD   calcium carbonate  1,250 mg Oral QPC breakfast   Chlorhexidine Gluconate Cloth  6 each Topical Q0600   Chlorhexidine Gluconate Cloth  6 each Topical Q0600   darbepoetin (ARANESP) injection - DIALYSIS  60 mcg Intravenous Q Wed-HD    diltiazem  180 mg Oral QPM   midodrine  10 mg Oral Q M,W,F-HD   mirtazapine  45 mg Oral QPM   mycophenolate  180 mg Oral BID   pantoprazole  40 mg Oral q morning   tacrolimus  6 mg Oral BID   Continuous Infusions:  meropenem (MERREM) IV Stopped (06/22/21 2137)   vancomycin 750 mg (06/21/21 1618)     LOS: 4 days    Time spent: 42 minutes spent on chart review, discussion with nursing staff, consultants, updating family and interview/physical exam; more than 50% of that time was spent in counseling and/or coordination of care.    Marnell Mcdaniel J [place] DO [facility] Available via Epic secure chat 7am-7pm After these hours, please refer to coverage provider listed on [email or web address] 06/23/2021, 5:17 PM

## 2021-06-24 ENCOUNTER — Inpatient Hospital Stay (HOSPITAL_COMMUNITY): Payer: 59

## 2021-06-24 ENCOUNTER — Telehealth: Payer: Self-pay | Admitting: Pulmonary Disease

## 2021-06-24 ENCOUNTER — Encounter (HOSPITAL_COMMUNITY): Payer: Self-pay | Admitting: Emergency Medicine

## 2021-06-24 DIAGNOSIS — J9601 Acute respiratory failure with hypoxia: Secondary | ICD-10-CM | POA: Diagnosis not present

## 2021-06-24 LAB — CULTURE, BLOOD (ROUTINE X 2): Culture: NO GROWTH

## 2021-06-24 MED ORDER — CEFPODOXIME PROXETIL 200 MG PO TABS
200.0000 mg | ORAL_TABLET | Freq: Every day | ORAL | Status: DC
  Administered 2021-06-24 – 2021-06-25 (×2): 200 mg via ORAL
  Filled 2021-06-24 (×4): qty 1

## 2021-06-24 MED ORDER — CHLORHEXIDINE GLUCONATE CLOTH 2 % EX PADS
6.0000 | MEDICATED_PAD | Freq: Every day | CUTANEOUS | Status: DC
  Administered 2021-06-24 – 2021-06-25 (×2): 6 via TOPICAL

## 2021-06-24 NOTE — Progress Notes (Signed)
[facility] for Infectious Disease   Reason for visit: Follow up on pneumonia  Interval History: no acute events, no fever, no growth from any cultures.   Day 6 total antibiotics  Physical Exam: Constitutional:  Vitals:   06/24/21 0350 06/24/21 0933  BP: 121/72 130/84  Pulse: (!) 108 (!) 116  Resp: 18 16  Temp: 98.9 F (37.2 C) 98.6 F (37 C)  SpO2: 100% 96%   patient appears in NAD Respiratory: Normal respiratory effort; CTA B Cardiovascular: RRR   Review of Systems: Constitutional: negative for fevers and chills Gastrointestinal: negative for nausea and diarrhea  Lab Results  Component Value Date   WBC 6.3 06/23/2021   HGB 7.8 (L) 06/23/2021   HCT 26.9 (L) 06/23/2021   MCV 101.9 (H) 06/23/2021   PLT 144 (L) 06/23/2021    Lab Results  Component Value Date   CREATININE 8.70 (H) 06/23/2021   BUN 45 (H) 06/23/2021   NA 137 06/23/2021   K 4.2 06/23/2021   CL 99 06/23/2021   CO2 27 06/23/2021    Lab Results  Component Value Date   ALT 8 06/21/2021   AST 11 (L) 06/21/2021   ALKPHOS 60 06/21/2021     Microbiology: Recent Results (from the past 240 hour(s))  Resp Panel by RT-PCR (Flu A&B, Covid) Nasopharyngeal Swab     Status: None   Collection Time: 06/19/21  5:34 PM   Specimen: Nasopharyngeal Swab; Nasopharyngeal(NP) swabs in vial transport medium  Result Value Ref Range Status   SARS Coronavirus 2 by RT PCR NEGATIVE NEGATIVE Final    Comment: (NOTE) SARS-CoV-2 target nucleic acids are NOT DETECTED.  The SARS-CoV-2 RNA is generally detectable in upper respiratory specimens during the acute phase of infection. The lowest concentration of SARS-CoV-2 viral copies this assay can detect is 138 copies/mL. A negative result does not preclude SARS-Cov-2 infection and should not be used as the sole basis for treatment or other patient management decisions. A negative result may occur with  improper specimen collection/handling, submission of specimen  other than nasopharyngeal swab, presence of viral mutation(s) within the areas targeted by this assay, and inadequate number of viral copies(<138 copies/mL). A negative result must be combined with clinical observations, patient history, and epidemiological information. The expected result is Negative.  Fact Sheet for Patients:  EntrepreneurPulse.com.au  Fact Sheet for Healthcare Providers:  IncredibleEmployment.be  This test is no t yet approved or cleared by the [place] FDA and  has been authorized for detection and/or diagnosis of SARS-CoV-2 by FDA under an Emergency Use Authorization (EUA). This EUA will remain  in effect (meaning this test can be used) for the duration of the COVID-19 declaration under Section 564(b)(1) of the Act, 21 U.S.C.section 360bbb-3(b)(1), unless the authorization is terminated  or revoked sooner.       Influenza A by PCR NEGATIVE NEGATIVE Final   Influenza B by PCR NEGATIVE NEGATIVE Final    Comment: (NOTE) The Xpert Xpress SARS-CoV-2/FLU/RSV plus assay is intended as an aid in the diagnosis of influenza from Nasopharyngeal swab specimens and should not be used as a sole basis for treatment. Nasal washings and aspirates are unacceptable for Xpert Xpress SARS-CoV-2/FLU/RSV testing.  Fact Sheet for Patients: EntrepreneurPulse.com.au  Fact Sheet for Healthcare Providers: IncredibleEmployment.be  This test is not yet approved or cleared by the [place] FDA and has been authorized for detection and/or diagnosis of SARS-CoV-2 by FDA under an Emergency Use Authorization (EUA). This EUA will remain in  effect (meaning this test can be used) for the duration of the COVID-19 declaration under Section 564(b)(1) of the Act, 21 U.S.C. section 360bbb-3(b)(1), unless the authorization is terminated or revoked.  Performed at [facility], [place] [address], [place],  [place] [address]   Blood culture (routine x 2)     Status: None   Collection Time: 06/19/21  7:14 PM   Specimen: BLOOD LEFT HAND  Result Value Ref Range Status   Specimen Description BLOOD LEFT HAND  Final   Special Requests   Final    BOTTLES DRAWN AEROBIC AND ANAEROBIC Blood Culture results may not be optimal due to an inadequate volume of blood received in culture bottles   Culture   Final    NO GROWTH 5 DAYS Performed at [facility], [place] [address] [place], [place] [address]    Report Status 06/24/2021 FINAL  Final  Body fluid culture w Gram Stain     Status: None (Preliminary result)   Collection Time: 06/22/21  2:13 PM   Specimen: Body Fluid  Result Value Ref Range Status   Specimen Description FLUID PLEURAL RIGHT  Final   Special Requests Immunocompromised  Final   Gram Stain   Final    FEW SQUAMOUS EPITHELIAL CELLS PRESENT FEW WBC SEEN NO ORGANISMS SEEN    Culture   Final    NO GROWTH 2 DAYS Performed at [facility], [address] [place], [place] [address]    Report Status PENDING  Incomplete  Acid Fast Smear (AFB)     Status: None   Collection Time: 06/22/21  2:13 PM   Specimen: Pleural, Right; Pleural Fluid  Result Value Ref Range Status   AFB Specimen Processing Concentration  Final   Acid Fast Smear Negative  Final    Comment: (NOTE) Performed At: [facility] [place], [place] [id number] Rush Farmer MD [id number]    Source (AFB) FLUID  Final    Comment: PLEURAL RIGHT Performed at [facility], [place] [address] [place], [place] [address]     Impression/Plan:  1. Pneumonia - improving on empiric treatment and improvement may largely be from the removal of 700 mL of fluid.  No further fever and not requiring daytime oxygen at this time. I will transition him to oral cefpodoxime and can continue for 3 more days  2.  ESRD - on intermittent hemodialysis and next session due tomorrow.    3.  Heart transplant -  followed by the transplant team and he should follow up with them next week.    OK for discharge from an ID standpoint.  I will sign off, call with questions

## 2021-06-24 NOTE — Progress Notes (Signed)
   NAME:  Rickey Erickson, MRN:  [id number], DOB:  1956-05-14, LOS: 5 ADMISSION DATE:  06/19/2021, CONSULTATION DATE:  8/30 REFERRING MD:  Algis Liming , CHIEF COMPLAINT:  Pleural effusion   History of Present Illness:  This is a 5 yom who was admitted 8/27 w/ fever (102), dyspnea (+) SIRS/sepsis w/ working Dx of CAP w/ right effusion. Cultures sent. Vanc/zosyn started.   Pertinent  Medical History  Prior Heart transplant ([facility] 2021), prior Trach & PEG w/ perf req exploration x 2, ESRD on HD (MWF)  Significant Hospital Events: Including procedures, antibiotic start and stop dates in addition to other pertinent events   8/27 admitted. Vanc/zosyn started. CT chest: 1. Moderate right pleural effusion with compressive atelectasis of the majority of the right lower lobe and right middle lobe and partial compression of the right upper lobe. Pneumonia is not excluded.  Small left pleural effusion with partial compressive atelectasis of the left lower lobe. Masslike subpleural consolidation in the lingula with adjacent surgical material may represent an area of postoperative change/scarring although a mass is not excluded. Mildly enlarged subcarinal lymph node. 8/29 [facility] accepted for transfer (prior transplant). Pulm consulted for Pleural effusion.  8/30 Thoracentesis: 700 ml removed: grossly exudate appearing 8/31 (S) protein: 6.1, (S) LDH: 161 Pleural fluid analysis (boarderline exudate by light's criteria) LDH: 101, protein: 3.6, fluid amylase: 36, glucose 10-->cultures pending, cytology pending.  Interim History / Subjective:   Patient is feeling better today. Chest radiograph showing bibasilar pleural effusions and atelectasis. Some slight increase in right pleural effusion. Denies fevers or chills.  Objective   Blood pressure 108/61, pulse (!) 107, temperature 98.2 F (36.8 C), temperature source Oral, resp. rate 18, height '5\' 8"'$  (1.727 m), weight 63.2 kg, SpO2 95 %.       No intake or output data in  the 24 hours ending 06/24/21 1540  Filed Weights   06/23/21 0338 06/23/21 0832 06/23/21 1219  Weight: 70.8 kg 65.1 kg 63.2 kg    Examination: General: no acute distress, resting in bed HEENT: [place]/AT, moist mucous membranes, sclera anicteric, tracheostomy scar Neuro: A&O x 3, moving all extremities CV: rrr, s1s2, no murmurs PULM: clear to auscultation bilaterally. No wheezing GI: soft, non-tender, non-distended, BS+ Extremities: warm, no edema Skin: no rashes   Resolved Hospital Problem list     Assessment & Plan:   CAP (NOS)  exudative pleural effusion by light's criteria. Prior Heart transplant ([facility] 2021) prior Trach & PEG w/ perf req exploration x 2, ESRD on HD (MWF) Prior h/o bacteremia  HLD HTN SIRS/Sepsis Anemia of chronic disease.  Thrombocytopenia    Pulmonary Problem List   CAP (NOS in an immunocompromised host) w/ boarderline exudative pleural effusion by light's criteria, likely parapneumonic process Chest radiograph continues to show small bilateral pleural effusions. Somewhat increased on right. Plan/rec Continue antibiotics per ID recs Volume removal w/ HD Await cultures and cytology as well as AFB No further plans for thoracentesis or chest tube at this time Will schedule patient for follow up with chest radiograph in 2 weeks to monitor effusions and plan for repeat CT chest in 4-6 weeks.  PCCM will sign off. Please call if questions arise.   Freda Jackson, MD [facility] Office: [phone]   See Amion for personal pager PCCM on call pager [phone] until 7pm. Please call Elink 7p-7a. [phone]

## 2021-06-24 NOTE — Telephone Encounter (Signed)
Please schedule patient for hospital follow up in 2 weeks with myself or a nurse practitioner with chest radiograph for pleural effusions and pneumonia.   Thanks, Wille Glaser

## 2021-06-24 NOTE — Progress Notes (Signed)
[facility] ROUNDING NOTE   Subjective:   Interval History: This is a 65 year old gentleman with a history of heart transplantation at [facility] May [id number] complicated by prolonged respiratory failure requiring tracheostomy PEG placement complicated by perforation and exploratory laparotomy x2.  He is also end-stage renal disease and presented with right pleural effusion and suspected right lower lobe pneumonia.  Successful dialysis 06/23/2021 Next dialysis will be 06/25/2021  Blood pressure 111/69 pulse 108 temperature 98.9 O2 sats 100%  Sodium 137 potassium 4.2 chloride 99 CO2 27 calcium 8.6 BUN 45 creatinine 8.7 glucose 114 albumin 2.6 phosphorus 6.5 hemoglobin 7.8   OP HD: [facility] MWF   4h  400/500   67kg  2/2 bath  TDC  /RU HeRO (not sure when ready to use)  Hep 2000  - mircera 75 q2, last 8/17  - calcitriol 0.75 po tiw   Objective:  Vital signs in last 24 hours:  Temp:  [98 F (36.7 C)-99.8 F (37.7 C)] 98.9 F (37.2 C) (09/01 0350) Pulse Rate:  [104-116] 108 (09/01 0350) Resp:  [17-34] 18 (09/01 0350) BP: (99-127)/(54-73) 121/72 (09/01 0350) SpO2:  [91 %-100 %] 100 % (09/01 0350) Weight:  [63.2 kg] 63.2 kg (08/31 1219)  Weight change: -5.7 kg Filed Weights   06/23/21 0338 06/23/21 0832 06/23/21 1219  Weight: 70.8 kg 65.1 kg 63.2 kg    Intake/Output: I/O last 3 completed shifts: In: 321.2 [P.O.:120; IV Piggyback:201.2] Out: 1918 [Other:1918]   Intake/Output this shift:  No intake/output data recorded. Gen alert, no distress, somewhat frail No rash, cyanosis or gangrene Sclera anicteric, throat clear  No jvd or bruits Chest clear bilat to bases, no rales/ wheezing RRR no MRG Abd soft ntnd no mass or ascites +bs GU normal male MS no joint effusions or deformity Ext no LE or UE edema, no wounds or ulcers Neuro is alert, Ox 3 , nf, gen weakness  RIJ TDC/ RUA HeRO AVG+bruit   Basic Metabolic Panel: Recent Labs  Lab 06/19/21 1741 06/20/21 0325 06/21/21 0214  06/23/21 0846  NA 139 137 136 137  K 4.3 4.5 4.6 4.2  CL 97* 96* 95* 99  CO2 '30 26 25 27  '$ GLUCOSE 101* 91 100* 114*  BUN 24* 31* 45* 45*  CREATININE 5.76* 6.40* 8.34* 8.70*  CALCIUM 8.8* 8.6* 8.6* 8.6*  MG  --  1.8  --   --   PHOS  --  4.5  --  6.5*     Liver Function Tests: Recent Labs  Lab 06/19/21 1741 06/21/21 0214 06/22/21 1529 06/23/21 0846  AST 16 11*  --   --   ALT 10 8  --   --   ALKPHOS 75 60  --   --   BILITOT 0.8 0.9  --   --   PROT 6.6 6.2* 6.1*  --   ALBUMIN 3.6 2.9*  --  2.6*    Recent Labs  Lab 06/19/21 1741  LIPASE 69*    No results for input(s): AMMONIA in the last 168 hours.  CBC: Recent Labs  Lab 06/19/21 1741 06/20/21 0325 06/21/21 0214 06/22/21 0119 06/23/21 0142  WBC 9.3 10.3 9.4 7.6 6.3  NEUTROABS 6.9  --   --   --   --   HGB 8.7* 8.2* 7.6* 7.2* 7.8*  HCT 30.0* 27.3* 26.5* 23.7* 26.9*  MCV 101.7* 101.1* 101.1* 98.3 101.9*  PLT 175 142* 134* 134* 144*     Cardiac Enzymes: No results for input(s): CKTOTAL, CKMB, CKMBINDEX,  TROPONINI in the last 168 hours.  BNP: Invalid input(s): POCBNP  CBG: No results for input(s): GLUCAP in the last 168 hours.  Microbiology: Results for orders placed or performed during the hospital encounter of 06/19/21  Resp Panel by RT-PCR (Flu A&B, Covid) Nasopharyngeal Swab     Status: None   Collection Time: 06/19/21  5:34 PM   Specimen: Nasopharyngeal Swab; Nasopharyngeal(NP) swabs in vial transport medium  Result Value Ref Range Status   SARS Coronavirus 2 by RT PCR NEGATIVE NEGATIVE Final    Comment: (NOTE) SARS-CoV-2 target nucleic acids are NOT DETECTED.  The SARS-CoV-2 RNA is generally detectable in upper respiratory specimens during the acute phase of infection. The lowest concentration of SARS-CoV-2 viral copies this assay can detect is 138 copies/mL. A negative result does not preclude SARS-Cov-2 infection and should not be used as the sole basis for treatment or other patient  management decisions. A negative result may occur with  improper specimen collection/handling, submission of specimen other than nasopharyngeal swab, presence of viral mutation(s) within the areas targeted by this assay, and inadequate number of viral copies(<138 copies/mL). A negative result must be combined with clinical observations, patient history, and epidemiological information. The expected result is Negative.  Fact Sheet for Patients:  EntrepreneurPulse.com.au  Fact Sheet for Healthcare Providers:  IncredibleEmployment.be  This test is no t yet approved or cleared by the [place] FDA and  has been authorized for detection and/or diagnosis of SARS-CoV-2 by FDA under an Emergency Use Authorization (EUA). This EUA will remain  in effect (meaning this test can be used) for the duration of the COVID-19 declaration under Section 564(b)(1) of the Act, 21 U.S.C.section 360bbb-3(b)(1), unless the authorization is terminated  or revoked sooner.       Influenza A by PCR NEGATIVE NEGATIVE Final   Influenza B by PCR NEGATIVE NEGATIVE Final    Comment: (NOTE) The Xpert Xpress SARS-CoV-2/FLU/RSV plus assay is intended as an aid in the diagnosis of influenza from Nasopharyngeal swab specimens and should not be used as a sole basis for treatment. Nasal washings and aspirates are unacceptable for Xpert Xpress SARS-CoV-2/FLU/RSV testing.  Fact Sheet for Patients: EntrepreneurPulse.com.au  Fact Sheet for Healthcare Providers: IncredibleEmployment.be  This test is not yet approved or cleared by the [place] FDA and has been authorized for detection and/or diagnosis of SARS-CoV-2 by FDA under an Emergency Use Authorization (EUA). This EUA will remain in effect (meaning this test can be used) for the duration of the COVID-19 declaration under Section 564(b)(1) of the Act, 21 U.S.C. section 360bbb-3(b)(1),  unless the authorization is terminated or revoked.  Performed at [facility], [place] [address] [place], [place] [address]   Blood culture (routine x 2)     Status: None   Collection Time: 06/19/21  7:14 PM   Specimen: BLOOD LEFT HAND  Result Value Ref Range Status   Specimen Description BLOOD LEFT HAND  Final   Special Requests   Final    BOTTLES DRAWN AEROBIC AND ANAEROBIC Blood Culture results may not be optimal due to an inadequate volume of blood received in culture bottles   Culture   Final    NO GROWTH 5 DAYS Performed at [facility], [place] [address], [place], [place] [address]    Report Status 06/24/2021 FINAL  Final  Body fluid culture w Gram Stain     Status: None (Preliminary result)   Collection Time: 06/22/21  2:13 PM   Specimen: Body  Fluid  Result Value Ref Range Status   Specimen Description FLUID PLEURAL RIGHT  Final   Special Requests Immunocompromised  Final   Gram Stain   Final    FEW SQUAMOUS EPITHELIAL CELLS PRESENT FEW WBC SEEN NO ORGANISMS SEEN    Culture   Final    NO GROWTH < 24 HOURS Performed at [facility], [place] [address] [place], [place] [address]    Report Status PENDING  Incomplete  Acid Fast Smear (AFB)     Status: None   Collection Time: 06/22/21  2:13 PM   Specimen: Pleural, Right; Pleural Fluid  Result Value Ref Range Status   AFB Specimen Processing Concentration  Final   Acid Fast Smear Negative  Final    Comment: (NOTE) Performed At: [facility] [place], [place] [id number] Rush Farmer MD [id number]    Source (AFB) FLUID  Final    Comment: PLEURAL RIGHT Performed at [facility], [place] [address], [place], [place] [address]     Coagulation Studies: No results for input(s): LABPROT, INR in the last 72 hours.  Urinalysis: No results for input(s): COLORURINE, LABSPEC, PHURINE, GLUCOSEU, HGBUR, BILIRUBINUR, KETONESUR, PROTEINUR, UROBILINOGEN, NITRITE, LEUKOCYTESUR in the last  72 hours.  Invalid input(s): APPERANCEUR     Imaging: DG Chest Port 1 View  Result Date: 06/22/2021 CLINICAL DATA:  Status post thoracentesis EXAM: PORTABLE CHEST 1 VIEW COMPARISON:  Chest x-ray 06/22/2021 6:13 a.m., CT chest 06/19/2021 FINDINGS: Stable left chest wall dialysis catheter with tip in the region of the cavoatrial junction. Right-sided hera cath tip with tip terminating overlying the right atrium. The heart and mediastinal contours are unchanged. Patchy airspace opacities within the right lower lung and left lower lobe. Redemonstration of left upper lobe linear atelectasis. No pulmonary edema. Interval decrease in size of a now trace to small volume residual right pleural effusion. Persistent trace, possibly loculated, left pleural effusion. No pneumothorax. No acute osseous abnormality. IMPRESSION: 1. Interval decrease in size of a now trace to small volume residual right pleural effusion. Persistent trace, possibly loculated, left pleural effusion. 2. Patchy bilateral lower lobe airspace opacities may represent atelectasis versus infection/inflammation. Electronically Signed   By: Iven Finn M.D.   On: 06/22/2021 15:19     Medications:    meropenem (MERREM) IV 500 mg (06/23/21 2151)   vancomycin 750 mg (06/21/21 1618)    atorvastatin  20 mg Oral q morning   calcitRIOL  0.75 mcg Oral Q M,W,F-HD   calcium carbonate  1,250 mg Oral QPC breakfast   Chlorhexidine Gluconate Cloth  6 each Topical Q0600   Chlorhexidine Gluconate Cloth  6 each Topical Q0600   darbepoetin (ARANESP) injection - DIALYSIS  60 mcg Intravenous Q Wed-HD   diltiazem  180 mg Oral QPM   midodrine  10 mg Oral Q M,W,F-HD   mirtazapine  45 mg Oral QPM   mycophenolate  180 mg Oral BID   pantoprazole  40 mg Oral q morning   tacrolimus  6 mg Oral BID   acetaminophen **OR** acetaminophen, levalbuterol, ondansetron **OR** ondansetron (ZOFRAN) IV  Assessment/ Plan:  Fever/ sepsis - in immunocomprised pt, per  pmd. Suspected lung source, on IV vanc and zosyn. Sig consolidation and/or effusion R lower/ middle lobe by CT.  Heart transplant - on prograf and cellcept, per pmd.  ESRD - on HD MWF. Has not missed. HeRO placed 05/15/21, ready to use 8/23.  Next dialysis will be 06/25/2021 HTN /vol - BP's  are wnl, cont home meds. No edema on exam, CXR / CT w/o edema. Small UF 1-2 L w/ next HD as tol.  A/C resp failure - on prn O2 at home, continuous here.  Anemia ckd - Hb 8's here, next esa due 8/31, have ordered   noted fairly low iron saturations.  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

## 2021-06-24 NOTE — Progress Notes (Signed)
Pharmacy Antibiotic Note  Rickey Erickson is a 65 y.o. male admitted on 06/19/2021 with sepsis / possible RLL PNA.  Pharmacy was consulted on 8/29 for vancomycin and meropenem dosing; previously on Zosyn.  Patient has a history of systolic CHF s/p LVAD, heart transplant [id number] complicated by prolonged RF requiring trach (now decannulated), lung/diaphragm injury, multi-microbial PNA, bacteremia, trach and PEG placement, PEG perforation s/p ex-lap x 2 with GJ placement, ESRD on HD (MWF). Patient presented to ED 06/20/21 with dyspnea and fever.  WBC 6.3, afebrile. ID noted pt improving on empiric treatment , Pleural effusion - no positive growth to date . Blood cultures negative/final.   ID has discontinued the Vancomycin today 9/1.  ESRD- HD MWF, last HD done 8/31  Plan: ID discontinued Vancomycin  Continue Meropenem 500 mg IV q24h  Monitor clinical status, renal function and culture results daily.  De-escalate abx when appropriate  Height: '5\' 8"'$  (172.7 cm) Weight: 63.2 kg (139 lb 5.3 oz) IBW/kg (Calculated) : 68.4  Temp (24hrs), Avg:98.8 F (37.1 C), Min:98 F (36.7 C), Max:99.8 F (37.7 C)  Recent Labs  Lab 06/19/21 1741 06/19/21 1914 06/20/21 0325 06/21/21 0214 06/22/21 0119 06/23/21 0142 06/23/21 0846  WBC 9.3  --  10.3 9.4 7.6 6.3  --   CREATININE 5.76*  --  6.40* 8.34*  --   --  8.70*  LATICACIDVEN 1.8 1.9  --   --   --   --   --      Estimated Creatinine Clearance: 7.8 mL/min (A) (by C-G formula based on SCr of 8.7 mg/dL (H)).    Allergies  Allergen Reactions   Scopolamine Other (See Comments)     Hallucinations Induces severe delirium    Lorazepam Other (See Comments)    Hallucinations Severe somnolence/delirium     Antimicrobials this admission: Zosyn 8/27 >> 8/29 Vancomycin 8/27 >> 9/1 Meropenem 8/29 >>   Microbiology results: 8/27 Blood Cx:  negative 8/27 Covid: neg; flu: negative 8/30 R pleural fluid Cx:  ngtd x2d 8/30 R pleual fluid fungus cx:  sent   Thank you for involving pharmacy in this patient's care. [id number] 06/24/2021 9:53 AM **Pharmacist phone directory can be found on [email or web address] listed under [place]**

## 2021-06-24 NOTE — Telephone Encounter (Signed)
Patient has been scheduled for a HFU on 07/08/21 at 11am with Beth.

## 2021-06-24 NOTE — Progress Notes (Signed)
PROGRESS NOTE    Rickey Erickson  [id number] DOB: 16-Dec-1955 DOA: 06/19/2021 PCP: Rolene Course, MD    Brief Narrative:  Rickey Erickson  Is a 65 year old male with a past medical history of heart transplant at [facility] May [id number] complicated by prolonged respiratory failure requiring tracheostomy (since decannulated), lung/diaphragm injury, multi microbial PNA, PEG tube complicated by perforation requiring exploratory laparotomy x2 with subsequent GJ placement, and ESRD who presented to the ED 8/27 with generalized malaise, progressive hypoxia, and fever to 102.    In the ED he was found to have a heart rate of 128 with respiratory rate of 23 and saturation 93% on nasal cannula support.  Potassium was 4.3.  CXR demonstrated moderate bilateral lower lobe atelectasis versus infiltrates with an air-fluid level in the right LL possibly representing a cavitary lesion.  The EDP contacted [facility] who in principal agreed to accept the patient but was unable to do so due to no beds being available.   Assessment & Plan:   Active Problems:   Sepsis ([place])   ESRD (end stage renal disease) ([place])   Essential hypertension   Status post heart transplantation ([place])   HLD (hyperlipidemia)   Fever   Acute respiratory failure with hypoxia ([facility])   Immunocompromised patient ([place])   Sinus tachycardia   Pleural effusion   Community acquired pneumonia of right lower lobe of lung   Sepsis, POA Right lower lobe pneumonia Patient presenting to ED with generalized malaise, shortness of breath and fever to 102 F.  Patient with history of heart transplant in immunocompromise state. Chest x-ray with mild/moderate bilateral lower lobe atelectasis with infiltrate, air-fluid level overlying right lung.  CT chest without contrast with moderate right pleural effusion with compressive atelectasis right lower lobe/right middle lobe and partial compression of the right upper lobe. --PCCM and ID following, appreciate  assistance --Blood cultures x2: No growth x5 days --Pleural fluid culture: No growth x24 hours (no organisms seen on gram stain) --Pleural fluid acid-fast smear/culture: Pending --Pleural fluid fungus culture: Pending --ID transition meropenem/vancomycin to cefpodoxime 200 mg p.o. daily for 4 additional days. --Amatory O2 screen today  Moderate right pleural effusion  Imaging notable for compressive atelectasis of RLL and RML and partial compression of the right upper lobe. PCCM consulted, and patient underwent ultrasound-guided thoracentesis on 8/30 with 700 mL of [place]/cloudy appearing fluid drained.  Pleural fluid studies consistent with exudative effusion; likely secondary to pneumonia.  Cytology with no malignant cells seen.  Repeat chest x-ray today with persistent bibasilar effusions atelectasis, slightly greater on right. --Continue antibiotics as above --Continue to follow cultures as above --Continue supplemental oxygen, maintain SPO2 > 92%, on room air this morning with SPO2 92-93% at rest   Persistent sinus tachycardia  Li[place] simply due to acute infection and fever - no chest pain - d-dimer elevated, but this may simply be due to infection. Bilateral lower extremity venous Dopplers negative for DVT or SVT- low risk as no other sx to suggest PE therefore will not check CTa chest or begin full dose anticoag   Status post heart transplant May 2021 Discussed with Cardiology at [facility] on 8/29; awaiting bed availability for transfer --Mycophenolate 180 mg p.o. twice daily --Tacrolimus 6 mg p.o. twice daily   Acute on chronic hypoxic respiratory failure Typically requires O2 support at night at baseline. --Continue supplemental oxygen, maintain SPO2 greater than 92%, on room air at rest this morning   ESRD - HD M/W/F Nephrology following, appreciate assistance.  Received HD via left TDC.  Right upper arm HeRO.  --HD today --Continue midodrine 10 mg every MWF with HD    HTN --Continue diltiazem CD1 180 mg p.o. nightly.  HLD: Atorvastatin 20 mg p.o. daily   Acute on chronic anemia Hemoglobin at [facility] on 8/4: 8.7.   Hemoglobin 8.7>7.6 >7.2>7.8, stable    Thrombocytopenia Likely due to acute illness/infectious etiology or antibiotics.   --Plt 175>142>134>144   DVT prophylaxis: SCDs Start: 06/19/21 2118   Code Status: Full Code Family Communication: Updated family present at bedside this morning  Disposition Plan:  Level of care: Progressive Status is: Inpatient  Remains inpatient appropriate because:Ongoing diagnostic testing needed not appropriate for outpatient work up, Unsafe d/c plan, IV treatments appropriate due to intensity of illness or inability to take PO, and Inpatient level of care appropriate due to severity of illness  Dispo: The patient is from: Home              Anticipated d/c is to:  Pending transfer to [facility]  vs back home              Patient currently is not medically stable to d/c.   Difficult to place patient No   Consultants:  PCCM Nephrology Infectious disease  Procedures:  Thoracentesis 8/30  Antimicrobials:  Vancomycin 8/27>> Meropenem 8/29>> Zosyn 8/27 - 8/29    Subjective: Patient seen examined at bedside, resting comfortably.  Sitting in bedside chair.  Family present at bedside.  States breathing continues to improve, now off of oxygen at rest this morning with SPO2 92%.  States has been using oxygen during the day but has originally only required at nighttime.  No other complaints or concerns at this time.  Denies headache, no fever/chills/night sweats, no nausea/vomiting/diarrhea, no chest pain, no palpitations, no abdominal pain, no weakness, no fatigue, no paresthesias.  No acute events overnight per nurse staff.  Objective: Vitals:   06/24/21 0200 06/24/21 0350 06/24/21 0933 06/24/21 1131  BP: 117/61 121/72 130/84 110/67  Pulse: (!) 104 (!) 108 (!) 116 (!) 106  Resp: '17 18 16 20  '$ Temp:   98.9 F (37.2 C) 98.6 F (37 C) 97.6 F (36.4 C)  TempSrc:  Oral Oral Oral  SpO2: 100% 100% 96% 97%  Weight:      Height:        Intake/Output Summary (Last 24 hours) at 06/24/2021 1323 Last data filed at 06/23/2021 1507 Gross per 24 hour  Intake 101.17 ml  Output --  Net 101.17 ml   Filed Weights   06/23/21 0338 06/23/21 0832 06/23/21 1219  Weight: 70.8 kg 65.1 kg 63.2 kg    Examination:  General exam: Appears calm and comfortable, chronically ill in appearance Respiratory system: Clear to auscultation. Respiratory effort normal.  On room air at rest with SPO2 92% Cardiovascular system: S1 & S2 heard, RRR. No JVD, murmurs, rubs, gallops or clicks. No pedal edema. Gastrointestinal system: Abdomen is nondistended, soft and nontender. No organomegaly or masses felt. Normal bowel sounds heard. Central nervous system: Alert and oriented. No focal neurological deficits. Extremities: Symmetric 5 x 5 power. Skin: No rashes, lesions or ulcers Psychiatry: Judgement and insight appear normal. Mood & affect appropriate.     Data Reviewed: I have personally reviewed following labs and imaging studies  CBC: Recent Labs  Lab 06/19/21 1741 06/20/21 0325 06/21/21 0214 06/22/21 0119 06/23/21 0142  WBC 9.3 10.3 9.4 7.6 6.3  NEUTROABS 6.9  --   --   --   --  HGB 8.7* 8.2* 7.6* 7.2* 7.8*  HCT 30.0* 27.3* 26.5* 23.7* 26.9*  MCV 101.7* 101.1* 101.1* 98.3 101.9*  PLT 175 142* 134* 134* [id number]*   Basic Metabolic Panel: Recent Labs  Lab 06/19/21 1741 06/20/21 0325 06/21/21 0214 06/23/21 0846  NA 139 137 136 137  K 4.3 4.5 4.6 4.2  CL 97* 96* 95* 99  CO2 '30 26 25 27  '$ GLUCOSE 101* 91 100* 114*  BUN 24* 31* 45* 45*  CREATININE 5.76* 6.40* 8.34* 8.70*  CALCIUM 8.8* 8.6* 8.6* 8.6*  MG  --  1.8  --   --   PHOS  --  4.5  --  6.5*   GFR: Estimated Creatinine Clearance: 7.8 mL/min (A) (by C-G formula based on SCr of 8.7 mg/dL (H)). Liver Function Tests: Recent Labs  Lab  06/19/21 1741 06/21/21 0214 06/22/21 1529 06/23/21 0846  AST 16 11*  --   --   ALT 10 8  --   --   ALKPHOS 75 60  --   --   BILITOT 0.8 0.9  --   --   PROT 6.6 6.2* 6.1*  --   ALBUMIN 3.6 2.9*  --  2.6*   Recent Labs  Lab 06/19/21 1741  LIPASE 69*   No results for input(s): AMMONIA in the last 168 hours. Coagulation Profile: No results for input(s): INR, PROTIME in the last 168 hours. Cardiac Enzymes: No results for input(s): CKTOTAL, CKMB, CKMBINDEX, TROPONINI in the last 168 hours. BNP (last 3 results) No results for input(s): PROBNP in the last 8760 hours. HbA1C: No results for input(s): HGBA1C in the last 72 hours. CBG: No results for input(s): GLUCAP in the last 168 hours. Lipid Profile: No results for input(s): CHOL, HDL, LDLCALC, TRIG, CHOLHDL, LDLDIRECT in the last 72 hours. Thyroid Function Tests: No results for input(s): TSH, T4TOTAL, FREET4, T3FREE, THYROIDAB in the last 72 hours. Anemia Panel: No results for input(s): VITAMINB12, FOLATE, FERRITIN, TIBC, IRON, RETICCTPCT in the last 72 hours.  Sepsis Labs: Recent Labs  Lab 06/19/21 1741 06/19/21 1914 06/21/21 1827 06/22/21 0119 06/23/21 0142  PROCALCITON  --   --  5.37 5.57 4.88  LATICACIDVEN 1.8 1.9  --   --   --     Recent Results (from the past 240 hour(s))  Resp Panel by RT-PCR (Flu A&B, Covid) Nasopharyngeal Swab     Status: None   Collection Time: 06/19/21  5:34 PM   Specimen: Nasopharyngeal Swab; Nasopharyngeal(NP) swabs in vial transport medium  Result Value Ref Range Status   SARS Coronavirus 2 by RT PCR NEGATIVE NEGATIVE Final    Comment: (NOTE) SARS-CoV-2 target nucleic acids are NOT DETECTED.  The SARS-CoV-2 RNA is generally detectable in upper respiratory specimens during the acute phase of infection. The lowest concentration of SARS-CoV-2 viral copies this assay can detect is 138 copies/mL. A negative result does not preclude SARS-Cov-2 infection and should not be used as the sole  basis for treatment or other patient management decisions. A negative result may occur with  improper specimen collection/handling, submission of specimen other than nasopharyngeal swab, presence of viral mutation(s) within the areas targeted by this assay, and inadequate number of viral copies(<138 copies/mL). A negative result must be combined with clinical observations, patient history, and epidemiological information. The expected result is Negative.  Fact Sheet for Patients:  EntrepreneurPulse.com.au  Fact Sheet for Healthcare Providers:  IncredibleEmployment.be  This test is no t yet approved or cleared by the [place] and  has been authorized for detection and/or diagnosis of SARS-CoV-2 by FDA under an Emergency Use Authorization (EUA). This EUA will remain  in effect (meaning this test can be used) for the duration of the COVID-19 declaration under Section 564(b)(1) of the Act, 21 U.S.C.section 360bbb-3(b)(1), unless the authorization is terminated  or revoked sooner.       Influenza A by PCR NEGATIVE NEGATIVE Final   Influenza B by PCR NEGATIVE NEGATIVE Final    Comment: (NOTE) The Xpert Xpress SARS-CoV-2/FLU/RSV plus assay is intended as an aid in the diagnosis of influenza from Nasopharyngeal swab specimens and should not be used as a sole basis for treatment. Nasal washings and aspirates are unacceptable for Xpert Xpress SARS-CoV-2/FLU/RSV testing.  Fact Sheet for Patients: EntrepreneurPulse.com.au  Fact Sheet for Healthcare Providers: IncredibleEmployment.be  This test is not yet approved or cleared by the [place] FDA and has been authorized for detection and/or diagnosis of SARS-CoV-2 by FDA under an Emergency Use Authorization (EUA). This EUA will remain in effect (meaning this test can be used) for the duration of the COVID-19 declaration under Section 564(b)(1) of the Act,  21 U.S.C. section 360bbb-3(b)(1), unless the authorization is terminated or revoked.  Performed at [facility], [place] [address] [place], [place] [address]   Blood culture (routine x 2)     Status: None   Collection Time: 06/19/21  7:14 PM   Specimen: BLOOD LEFT HAND  Result Value Ref Range Status   Specimen Description BLOOD LEFT HAND  Final   Special Requests   Final    BOTTLES DRAWN AEROBIC AND ANAEROBIC Blood Culture results may not be optimal due to an inadequate volume of blood received in culture bottles   Culture   Final    NO GROWTH 5 DAYS Performed at [facility], [place] [address], [place], [place] [address]    Report Status 06/24/2021 FINAL  Final  Body fluid culture w Gram Stain     Status: None (Preliminary result)   Collection Time: 06/22/21  2:13 PM   Specimen: Body Fluid  Result Value Ref Range Status   Specimen Description FLUID PLEURAL RIGHT  Final   Special Requests Immunocompromised  Final   Gram Stain   Final    FEW SQUAMOUS EPITHELIAL CELLS PRESENT FEW WBC SEEN NO ORGANISMS SEEN    Culture   Final    NO GROWTH 2 DAYS Performed at [facility], [address], [place], [place] [address]    Report Status PENDING  Incomplete  Acid Fast Smear (AFB)     Status: None   Collection Time: 06/22/21  2:13 PM   Specimen: Pleural, Right; Pleural Fluid  Result Value Ref Range Status   AFB Specimen Processing Concentration  Final   Acid Fast Smear Negative  Final    Comment: (NOTE) Performed At: [facility] [place], [place] [id number] Rush Farmer MD [id number]    Source (AFB) FLUID  Final    Comment: PLEURAL RIGHT Performed at [facility], [place] [address], [place],  [address]          Radiology Studies: DG Chest Port 1 View  Result Date: 06/24/2021 CLINICAL DATA:  Pneumonia, thoracentesis 2 days ago EXAM: PORTABLE CHEST 1 VIEW COMPARISON:  Portable exam [id number] hours compared to 06/22/2021  FINDINGS: BILATERAL jugular lines, RIGHT terminating over RIGHT atrium and LEFT terminating over SVC near cavoatrial junction. Normal heart size post median sternotomy. Mediastinal contours and  pulmonary vascularity normal. Bibasilar pleural effusions and atelectasis persists. Upper lungs clear. No pneumothorax or acute osseous findings. IMPRESSION: Persistent bibasilar effusions and atelectasis, slightly greater on RIGHT. Electronically Signed   By: Lavonia Dana M.D.   On: 06/24/2021 10:00   DG Chest Port 1 View  Result Date: 06/22/2021 CLINICAL DATA:  Status post thoracentesis EXAM: PORTABLE CHEST 1 VIEW COMPARISON:  Chest x-ray 06/22/2021 6:13 a.m., CT chest 06/19/2021 FINDINGS: Stable left chest wall dialysis catheter with tip in the region of the cavoatrial junction. Right-sided hera cath tip with tip terminating overlying the right atrium. The heart and mediastinal contours are unchanged. Patchy airspace opacities within the right lower lung and left lower lobe. Redemonstration of left upper lobe linear atelectasis. No pulmonary edema. Interval decrease in size of a now trace to small volume residual right pleural effusion. Persistent trace, possibly loculated, left pleural effusion. No pneumothorax. No acute osseous abnormality. IMPRESSION: 1. Interval decrease in size of a now trace to small volume residual right pleural effusion. Persistent trace, possibly loculated, left pleural effusion. 2. Patchy bilateral lower lobe airspace opacities may represent atelectasis versus infection/inflammation. Electronically Signed   By: Iven Finn M.D.   On: 06/22/2021 15:19        Scheduled Meds:  atorvastatin  20 mg Oral q morning   calcitRIOL  0.75 mcg Oral Q M,W,F-HD   calcium carbonate  1,250 mg Oral QPC breakfast   cefpodoxime  200 mg Oral Daily   Chlorhexidine Gluconate Cloth  6 each Topical Q0600   darbepoetin (ARANESP) injection - DIALYSIS  60 mcg Intravenous Q Wed-HD   diltiazem  180 mg Oral  QPM   midodrine  10 mg Oral Q M,W,F-HD   mirtazapine  45 mg Oral QPM   mycophenolate  180 mg Oral BID   pantoprazole  40 mg Oral q morning   tacrolimus  6 mg Oral BID   Continuous Infusions:     LOS: 5 days    Time spent: 38 minutes spent on chart review, discussion with nursing staff, consultants, updating family and interview/physical exam; more than 50% of that time was spent in counseling and/or coordination of care.    Rozelia Catapano J [place] DO [facility] Available via Epic secure chat 7am-7pm After these hours, please refer to coverage provider listed on [email or web address] 06/24/2021, 1:23 PM

## 2021-06-25 LAB — CBC
HCT: 26.5 % — ABNORMAL LOW (ref 39.0–52.0)
Hemoglobin: 8.1 g/dL — ABNORMAL LOW (ref 13.0–17.0)
MCH: 30.6 pg (ref 26.0–34.0)
MCHC: 30.6 g/dL (ref 30.0–36.0)
MCV: 100 fL (ref 80.0–100.0)
Platelets: 175 10*3/uL (ref 150–400)
RBC: 2.65 MIL/uL — ABNORMAL LOW (ref 4.22–5.81)
RDW: 14.3 % (ref 11.5–15.5)
WBC: 6.8 10*3/uL (ref 4.0–10.5)
nRBC: 0 % (ref 0.0–0.2)

## 2021-06-25 LAB — BASIC METABOLIC PANEL
Anion gap: 11 (ref 5–15)
BUN: 44 mg/dL — ABNORMAL HIGH (ref 8–23)
CO2: 29 mmol/L (ref 22–32)
Calcium: 8.6 mg/dL — ABNORMAL LOW (ref 8.9–10.3)
Chloride: 101 mmol/L (ref 98–111)
Creatinine, Ser: 7.84 mg/dL — ABNORMAL HIGH (ref 0.61–1.24)
GFR, Estimated: 7 mL/min — ABNORMAL LOW (ref >60)
Glucose, Bld: 110 mg/dL — ABNORMAL HIGH (ref 70–99)
Potassium: 3.9 mmol/L (ref 3.5–5.1)
Sodium: 141 mmol/L (ref 135–145)

## 2021-06-25 MED ORDER — HEPARIN SODIUM (PORCINE) 1000 UNIT/ML DIALYSIS: 1000.0000 [IU] | INTRAMUSCULAR | Status: DC | PRN

## 2021-06-25 MED ORDER — CEFPODOXIME PROXETIL 200 MG PO TABS: 200.0000 mg | ORAL_TABLET | Freq: Every day | ORAL | 0 refills | Status: AC

## 2021-06-25 MED ORDER — MIDODRINE HCL 10 MG PO TABS: 10.0000 mg | ORAL_TABLET | ORAL | 0 refills | Status: DC

## 2021-06-25 MED ORDER — MIDODRINE HCL 10 MG PO TABS: 10.0000 mg | ORAL_TABLET | ORAL | 0 refills | Status: AC

## 2021-06-25 MED ORDER — LIDOCAINE HCL (PF) 1 % IJ SOLN: 5.0000 mL | INTRAMUSCULAR | Status: DC | PRN

## 2021-06-25 MED ORDER — PENTAFLUOROPROP-TETRAFLUOROETH EX AERO: 1.0000 "application " | INHALATION_SPRAY | CUTANEOUS | Status: DC | PRN

## 2021-06-25 MED ORDER — LIDOCAINE-PRILOCAINE 2.5-2.5 % EX CREA: 1.0000 "application " | TOPICAL_CREAM | CUTANEOUS | Status: DC | PRN

## 2021-06-25 MED ORDER — SODIUM CHLORIDE 0.9 % IV SOLN: 100.0000 mL | INTRAVENOUS | Status: DC | PRN

## 2021-06-25 MED ORDER — CEFPODOXIME PROXETIL 200 MG PO TABS: 200.0000 mg | ORAL_TABLET | Freq: Every day | ORAL | 0 refills | Status: DC

## 2021-06-25 MED ORDER — ALTEPLASE 2 MG IJ SOLR: 2.0000 mg | Freq: Once | INTRAMUSCULAR | Status: DC | PRN

## 2021-06-25 NOTE — Progress Notes (Signed)
Pt returned from dialysis.  Vitals taken and within normal range, with exception of HR: 110. Pt is A&O X4 and comfortable.

## 2021-06-25 NOTE — Discharge Summary (Signed)
Physician Discharge Summary  Trigg Bankowski [id number] DOB: [birth year] DOA: 06/19/2021  PCP: Rolene Course, MD  Admit date: 06/19/2021 Discharge date: 06/25/2021  Admitted From: Home Disposition: Home  Recommendations for Outpatient Follow-up:  Follow up with cardiology [facility] 1-2 weeks Follow-up scheduled with pulmonology on 07/08/2021 at 48 AM for repeat chest x-ray Will need repeat CT chest 4-6 weeks per PCCM Continue antibiotics with Vantin to complete course for pneumonia per infectious disease Increased midodrine to 10 mg daily on dialysis days Follow-up finalized pleural fluid cultures, pleural fluid acid-fast/fungus cultures which were still pending at time of discharge.  Home Health: None Equipment/Devices: Oxygen, 2 L per nasal cannula  Discharge Condition: Stable CODE STATUS: Full code Diet recommendation: Heart healthy diet  History of present illness:  Dumont Charania  Is a 65 year old male with a past medical history of heart transplant at [facility] May [id number] complicated by prolonged respiratory failure requiring tracheostomy (since decannulated), lung/diaphragm injury, multi microbial PNA, PEG tube complicated by perforation requiring exploratory laparotomy x2 with subsequent GJ placement, and ESRD who presented to the ED 8/27 with generalized malaise, progressive hypoxia, and fever to 102.     In the ED he was found to have a heart rate of 128 with respiratory rate of 23 and saturation 93% on nasal cannula support.  Potassium was 4.3.  CXR demonstrated moderate bilateral lower lobe atelectasis versus infiltrates with an air-fluid level in the right LL possibly representing a cavitary lesion.  The EDP contacted [facility] who in principal agreed to accept the patient but was unable to do so due to no beds being available.  Hospital course:  Sepsis, POA Right lower lobe pneumonia Patient presenting to ED with generalized malaise, shortness of breath and fever to 102 F.  Patient with  history of heart transplant in immunocompromise state. Chest x-ray with mild/moderate bilateral lower lobe atelectasis with infiltrate, air-fluid level overlying right lung.  CT chest without contrast with moderate right pleural effusion with compressive atelectasis right lower lobe/right middle lobe and partial compression of the right upper lobe.  Pulmonary critical care and infectious disease was consulted and followed during hospital course.  Patient was initially started on empiric antibiotics with vancomycin and meropenem which was transitioned to Vantin 200 mg p.o. daily to complete antibiotic course.  Blood cultures during hospitalization showed no growth x5 days.  Pleural fluid culture showed no growth but not finalized with no organisms seen on gram stain.  Pleural fluid acid-fast smear/culture and fungus culture were still pending at time of discharge.   Moderate right pleural effusion  Imaging notable for compressive atelectasis of RLL and RML and partial compression of the right upper lobe. PCCM consulted, and patient underwent ultrasound-guided thoracentesis on 8/30 with 700 mL of [place]/cloudy appearing fluid drained.  Pleural fluid studies consistent with exudative effusion; likely secondary to pneumonia.  Cytology with no malignant cells seen.  Repeat chest x-ray today with persistent bibasilar effusions atelectasis, slightly greater on right.  Patient was initially started on broad-spectrum antibiotics with meropenem/vancomycin which was de-escalated to [facility] per infectious disease.  Patient has outpatient follow-up with pulmonology on 07/08/2021 for repeat chest x-ray.  Continue supplemental oxygen, 2 L per nasal cannula.    Status post heart transplant May 2021 Outpatient follow-up with cardiology [facility] 1-2 weeks.  Continue immunosuppression with Mycophenolate 180 mg p.o. twice daily and tacrolimus 6 mg p.o. twice daily   Acute on chronic hypoxic respiratory failure Typically  requires O2 support at night at baseline,  now requiring continuously at 2 L per nasal cannula.   ESRD - HD M/W/F Nephrology following was consulted and followed during hospital course.  Continue midodrine 10 mg every MWF with HD.  Torsemide 100 mg p.o. daily on nondialysis days.   HTN: Continue diltiazem CD1 180 mg p.o. nightly.   HLD: Atorvastatin 20 mg p.o. daily   Acute on chronic anemia Hemoglobin 8.5 at time of discharge; stable   Thrombocytopenia Likely due to acute illness/infectious etiology vs antibiotics.  Platelets stable, 175 at time of discharge.  Discharge Diagnoses:  Active Problems:   ESRD (end stage renal disease) ([place])   Essential hypertension   Status post heart transplantation ([place])   HLD (hyperlipidemia)   Acute respiratory failure with hypoxia ([facility])   Immunocompromised patient ([place])   Sinus tachycardia   Pleural effusion   Community acquired pneumonia of right lower lobe of lung    Discharge Instructions  Discharge Instructions     Call MD for:  difficulty breathing, headache or visual disturbances   Complete by: As directed    Call MD for:  extreme fatigue   Complete by: As directed    Call MD for:  persistant dizziness or light-headedness   Complete by: As directed    Call MD for:  persistant nausea and vomiting   Complete by: As directed    Call MD for:  severe uncontrolled pain   Complete by: As directed    Call MD for:  temperature >100.4   Complete by: As directed    Diet - low sodium heart healthy   Complete by: As directed    Increase activity slowly   Complete by: As directed    No wound care   Complete by: As directed       Allergies as of 06/25/2021       Reactions   Scopolamine Other (See Comments)    Hallucinations Induces severe delirium    Lorazepam Other (See Comments)   Hallucinations Severe somnolence/delirium         Medication List     TAKE these medications    acetaminophen 500 MG tablet Commonly known  as: TYLENOL Take 500 mg by mouth every 6 (six) hours as needed for headache (pain).   aspirin 81 MG EC tablet Take 81 mg by mouth every morning.   atorvastatin 20 MG tablet Commonly known as: LIPITOR Take 20 mg by mouth every morning.   calcium carbonate 1500 (600 Ca) MG Tabs tablet Commonly known as: OSCAL Take 1,500 mg by mouth every morning.   cefpodoxime 200 MG tablet Commonly known as: VANTIN Take 1 tablet (200 mg total) by mouth daily for 2 days. Start taking on: June 26, 2021   clopidogrel 75 MG tablet Commonly known as: PLAVIX Take 75 mg by mouth every morning.   diltiazem 180 MG 24 hr capsule Commonly known as: CARDIZEM CD Take 180 mg by mouth every evening.   midodrine 10 MG tablet Commonly known as: PROAMATINE Take 1 tablet (10 mg total) by mouth every Monday, Wednesday, and Friday with hemodialysis. Start taking on: June 28, 2021 What changed:  medication strength how much to take when to take this additional instructions   mirtazapine 45 MG tablet Commonly known as: REMERON Take 45 mg by mouth every evening.   mycophenolate 180 MG EC tablet Commonly known as: MYFORTIC Take 180 mg by mouth 2 (two) times daily.   pantoprazole 40 MG tablet Commonly known as: PROTONIX Take 40 mg by mouth  every morning.   tacrolimus 1 MG capsule Commonly known as: PROGRAF Take 1 mg by mouth 2 (two) times daily. Take with a 5 mg capsule for a total dose of 6 mg twice daily   tacrolimus 5 MG capsule Commonly known as: PROGRAF Take 5 mg by mouth 2 (two) times daily. Take with a 1 mg capsule for a total dose of 6 mg twice daily   torsemide 100 MG tablet Commonly known as: DEMADEX Take 100 mg by mouth See admin instructions. Take one tablet (100 mg) by mouth on Sunday, Tuesday, Thursday, Saturday mornings (non-dialysis days)        Follow-up Information     Rolene Course, MD. Schedule an appointment as soon as possible for a visit in 1 week(s).    Specialty: Cardiology Contact information: [place] [address] [phone]         Martyn Ehrich, NP. Go on 07/08/2021.   Specialty: Pulmonary Disease Why: 11:00am Contact information: [address] [place] [address] [phone]                Allergies  Allergen Reactions   Scopolamine Other (See Comments)     Hallucinations Induces severe delirium    Lorazepam Other (See Comments)    Hallucinations Severe somnolence/delirium     Consultations: PCCM Nephrology, Dr. Erin Fulling Infectious disease, Dr. Linus Salmons   Procedures/Studies: DG Chest 2 View  Result Date: 06/21/2021 CLINICAL DATA:  Pneumonia and fever. EXAM: CHEST - 2 VIEW COMPARISON:  Chest radiograph dated 06/19/2021. Chest CT dated 06/19/2021. FINDINGS: Left-sided dialysis catheter as well as a tunnel catheter on the right side in similar position. Interval increase in the size of the right pleural effusion with associated increase in right lung opacity compared to prior radiograph. Small left pleural effusion and left lung base atelectasis or infiltrate. No pneumothorax. Stable cardiomediastinal silhouette median sternotomy wires. No acute osseous pathology. IMPRESSION: Interval increase in the size of the right pleural effusion with associated increase in right lung opacity since the prior radiograph. Electronically Signed   By: Anner Crete M.D.   On: 06/21/2021 20:17   CT Chest Wo Contrast  Result Date: 06/19/2021 CLINICAL DATA:  Evaluate for pneumonia. EXAM: CT CHEST WITHOUT CONTRAST TECHNIQUE: Multidetector CT imaging of the chest was performed following the standard protocol without IV contrast. COMPARISON:  Chest radiograph dated 06/19/2021. FINDINGS: Evaluation of this exam is limited in the absence of intravenous contrast. Cardiovascular: There is no cardiomegaly. Small pericardial effusion. Mild atherosclerotic calcification of thoracic aorta. No aneurysmal  dilatation. The central pulmonary arteries are grossly unremarkable. Right IJ central venous line with tip in the region of the cavoatrial junction. Left IJ line with tip in the central SVC close to the cavoatrial junction. Mediastinum/Nodes: Mildly enlarged subcarinal lymph node measures 15 mm short axis. No mediastinal adenopathy. The esophagus is grossly unremarkable. Mediastinal fluid collection. There is diffuse mediastinal stranding and edema, likely postoperative. Lungs/Pleura: Moderate right pleural effusion with compressive atelectasis of the majority of the right lower lobe and right middle lobe and partial compression of the right upper lobe. Pneumonia is not excluded. Small left pleural effusion with partial compressive atelectasis of the left lower lobe. There is a 3.3 x 2.0 cm masslike subpleural consolidation in the lingula with adjacent surgical material. This may represent an area of postoperative change/scarring although a mass is not excluded. There is no pneumothorax. The central airways are patent. Upper Abdomen: Subcentimeter hepatic hypodense lesions  are not characterized. Postsurgical changes of stomach. Musculoskeletal: Osteopenia with degenerative changes of the spine. Age indeterminate mild compression fracture of superior endplates of 624THL and 624THL. Correlation with clinical exam and point tenderness recommended. Median sternotomy wires. IMPRESSION: 1. Moderate right pleural effusion with compressive atelectasis of the majority of the right lower lobe and right middle lobe and partial compression of the right upper lobe. Pneumonia is not excluded. 2. Small left pleural effusion with partial compressive atelectasis of the left lower lobe. 3. Masslike subpleural consolidation in the lingula with adjacent surgical material may represent an area of postoperative change/scarring although a mass is not excluded. 4. Mildly enlarged subcarinal lymph node. 5. Age indeterminate mild compression  fracture of superior endplates of 624THL and 624THL. Correlation with clinical exam and point tenderness recommended. 6. Aortic Atherosclerosis (ICD10-I70.0). Electronically Signed   By: Anner Crete M.D.   On: 06/19/2021 21:47   DG Chest Port 1 View  Result Date: 06/24/2021 CLINICAL DATA:  Pneumonia, thoracentesis 2 days ago EXAM: PORTABLE CHEST 1 VIEW COMPARISON:  Portable exam [id number] hours compared to 06/22/2021 FINDINGS: BILATERAL jugular lines, RIGHT terminating over RIGHT atrium and LEFT terminating over SVC near cavoatrial junction. Normal heart size post median sternotomy. Mediastinal contours and pulmonary vascularity normal. Bibasilar pleural effusions and atelectasis persists. Upper lungs clear. No pneumothorax or acute osseous findings. IMPRESSION: Persistent bibasilar effusions and atelectasis, slightly greater on RIGHT. Electronically Signed   By: Lavonia Dana M.D.   On: 06/24/2021 10:00   DG Chest Port 1 View  Result Date: 06/22/2021 CLINICAL DATA:  Status post thoracentesis EXAM: PORTABLE CHEST 1 VIEW COMPARISON:  Chest x-ray 06/22/2021 6:13 a.m., CT chest 06/19/2021 FINDINGS: Stable left chest wall dialysis catheter with tip in the region of the cavoatrial junction. Right-sided hera cath tip with tip terminating overlying the right atrium. The heart and mediastinal contours are unchanged. Patchy airspace opacities within the right lower lung and left lower lobe. Redemonstration of left upper lobe linear atelectasis. No pulmonary edema. Interval decrease in size of a now trace to small volume residual right pleural effusion. Persistent trace, possibly loculated, left pleural effusion. No pneumothorax. No acute osseous abnormality. IMPRESSION: 1. Interval decrease in size of a now trace to small volume residual right pleural effusion. Persistent trace, possibly loculated, left pleural effusion. 2. Patchy bilateral lower lobe airspace opacities may represent atelectasis versus infection/inflammation.  Electronically Signed   By: Iven Finn M.D.   On: 06/22/2021 15:19   DG CHEST PORT 1 VIEW  Result Date: 06/22/2021 CLINICAL DATA:  Follow-up right pleural effusion EXAM: PORTABLE CHEST 1 VIEW COMPARISON:  06/21/2021 FINDINGS: Cardiac shadow is stable but enlarged. Postsurgical changes are noted. Left-sided dialysis catheter and right-sided hero graft are again noted and stable. Bilateral pleural effusions are again noted right greater than left with associated atelectatic changes. The overall appearance is similar to that seen on the previous day. No acute abnormality noted. IMPRESSION: No significant change from the previous day. Bilateral effusions and atelectatic changes are seen. Electronically Signed   By: Inez Catalina M.D.   On: 06/22/2021 08:54   DG Chest Portable 1 View  Result Date: 06/19/2021 CLINICAL DATA:  Fever and vomiting. EXAM: PORTABLE CHEST 1 VIEW COMPARISON:  May 25, 2021 FINDINGS: There is stable bilateral venous catheter positioning. Multiple sternal wires are seen. Mild to moderate severity areas of atelectasis and/or infiltrate are again seen within the bilateral lower lobes. Small bilateral pleural effusions are noted. An air-fluid level is seen overlying  the lower right lung. No pneumothorax is identified. The heart size and mediastinal contours are within normal limits. The visualized skeletal structures are unremarkable. IMPRESSION: 1. Mild to moderate severity bilateral lower lobe atelectasis and/or infiltrate. 2. Small bilateral pleural effusions. 3. Air-fluid level overlying the lower right lung which may represent the presence of an underlying cavitary lesion. Correlation with chest CT is recommended. Electronically Signed   By: Virgina Norfolk M.D.   On: 06/19/2021 19:48   VAS [place] LOWER EXTREMITY VENOUS (DVT)  Result Date: 06/21/2021  Lower Venous DVT Study Patient Name:  BERNIS NEAVE  Date of Exam:   06/21/2021 Medical Rec #: [id number]      Accession #:     [id number] Date of Birth: September 21, 1956      Patient Gender: M Patient Age:   66 years Exam Location:  [facility] Procedure:      VAS [place] LOWER EXTREMITY VENOUS (DVT) Referring Phys: JEFFREY MCCLUNG --------------------------------------------------------------------------------  Indications: "Suspected DVT" - per MD order. Other Indications: Elevated D-dimer & tachycardia in patient with sepsis. Comparison Study: Previous exam '@[facility]'$  04/15/2020 - negative for DVT Performing Technologist: Jody Hill RVT, RDMS  Examination Guidelines: A complete evaluation includes B-mode imaging, spectral Doppler, color Doppler, and power Doppler as needed of all accessible portions of each vessel. Bilateral testing is considered an integral part of a complete examination. Limited examinations for reoccurring indications may be performed as noted. The reflux portion of the exam is performed with the patient in reverse Trendelenburg.  +---------+---------------+---------+-----------+----------+--------------+ RIGHT    CompressibilityPhasicitySpontaneityPropertiesThrombus Aging +---------+---------------+---------+-----------+----------+--------------+ CFV      Full           Yes      Yes                                 +---------+---------------+---------+-----------+----------+--------------+ SFJ      Full                                                        +---------+---------------+---------+-----------+----------+--------------+ FV Prox  Full           Yes      Yes                                 +---------+---------------+---------+-----------+----------+--------------+ FV Mid   Full           Yes      Yes                                 +---------+---------------+---------+-----------+----------+--------------+ FV DistalFull           Yes      Yes                                 +---------+---------------+---------+-----------+----------+--------------+ PFV      Full                                                         +---------+---------------+---------+-----------+----------+--------------+  POP      Full           Yes      Yes                                 +---------+---------------+---------+-----------+----------+--------------+ PTV      Full                                                        +---------+---------------+---------+-----------+----------+--------------+ PERO     Full                                                        +---------+---------------+---------+-----------+----------+--------------+   +---------+---------------+---------+-----------+----------+--------------+ LEFT     CompressibilityPhasicitySpontaneityPropertiesThrombus Aging +---------+---------------+---------+-----------+----------+--------------+ CFV      Full           Yes      Yes                                 +---------+---------------+---------+-----------+----------+--------------+ SFJ      Full                                                        +---------+---------------+---------+-----------+----------+--------------+ FV Prox  Full           Yes      Yes                                 +---------+---------------+---------+-----------+----------+--------------+ FV Mid   Full           Yes      Yes                                 +---------+---------------+---------+-----------+----------+--------------+ FV DistalFull           Yes      Yes                                 +---------+---------------+---------+-----------+----------+--------------+ PFV      Full                                                        +---------+---------------+---------+-----------+----------+--------------+ POP      Full           Yes      Yes                                 +---------+---------------+---------+-----------+----------+--------------+ PTV  Full                                                         +---------+---------------+---------+-----------+----------+--------------+ PERO     Full                                                        +---------+---------------+---------+-----------+----------+--------------+     Summary: BILATERAL: - No evidence of deep vein thrombosis seen in the lower extremities, bilaterally. - No evidence of superficial venous thrombosis in the lower extremities, bilaterally. -No evidence of popliteal cyst, bilaterally.   *See table(s) above for measurements and observations. Electronically signed by Servando Snare MD on 06/21/2021 at 5:33:34 PM.    Final      Subjective: Patient seen examined at bedside, resting comfortably.  Currently receiving HD.  No complaints this morning.  Feels ready for discharge home.  Discussed need to continue antibiotics for an additional 2 days to complete course per infectious disease recommendations.  Also discussed need for outpatient follow-up with pulmonary critical care medicine in 2 weeks for repeat chest x-ray.  Also recommended follow-up with his primary cardiologist.  No other complaints or concerns at this time.  Denies headache, no fever/chills/night sweats, no nausea/vomiting/diarrhea, no chest pain, no palpitations, no shortness of breath more than his typical baseline, no abdominal pain, no weakness, no fatigue, no paresthesias.  No acute events overnight per nursing staff.  Discharge Exam: Vitals:   06/25/21 1036 06/25/21 1045  BP: 95/65 104/62  Pulse:    Resp: (!) 28 (!) 26  Temp:    SpO2:     Vitals:   06/25/21 1021 06/25/21 1030 06/25/21 1036 06/25/21 1045  BP: 90/64 (!) 85/63 95/65 104/62  Pulse:      Resp: (!) 24 (!) 28 (!) 28 (!) 26  Temp:      TempSrc:      SpO2:      Weight:      Height:        General: Pt is alert, awake, not in acute distress, chronically ill in appearance Cardiovascular: RRR, S1/S2 +, no rubs, no gallops Respiratory: CTA bilaterally, no wheezing, no rhonchi, on 2 L nasal  cannula with SPO2 100% at rest Abdominal: Soft, NT, ND, bowel sounds + Extremities: no edema, no cyanosis    The results of significant diagnostics from this hospitalization (including imaging, microbiology, ancillary and laboratory) are listed below for reference.     Microbiology: Recent Results (from the past 240 hour(s))  Resp Panel by RT-PCR (Flu A&B, Covid) Nasopharyngeal Swab     Status: None   Collection Time: 06/19/21  5:34 PM   Specimen: Nasopharyngeal Swab; Nasopharyngeal(NP) swabs in vial transport medium  Result Value Ref Range Status   SARS Coronavirus 2 by RT PCR NEGATIVE NEGATIVE Final    Comment: (NOTE) SARS-CoV-2 target nucleic acids are NOT DETECTED.  The SARS-CoV-2 RNA is generally detectable in upper respiratory specimens during the acute phase of infection. The lowest concentration of SARS-CoV-2 viral copies this assay can detect is 138 copies/mL. A negative result does not preclude SARS-Cov-2 infection and should not be used as the sole basis  for treatment or other patient management decisions. A negative result may occur with  improper specimen collection/handling, submission of specimen other than nasopharyngeal swab, presence of viral mutation(s) within the areas targeted by this assay, and inadequate number of viral copies(<138 copies/mL). A negative result must be combined with clinical observations, patient history, and epidemiological information. The expected result is Negative.  Fact Sheet for Patients:  EntrepreneurPulse.com.au  Fact Sheet for Healthcare Providers:  IncredibleEmployment.be  This test is no t yet approved or cleared by the [place] FDA and  has been authorized for detection and/or diagnosis of SARS-CoV-2 by FDA under an Emergency Use Authorization (EUA). This EUA will remain  in effect (meaning this test can be used) for the duration of the COVID-19 declaration under Section 564(b)(1) of  the Act, 21 U.S.C.section 360bbb-3(b)(1), unless the authorization is terminated  or revoked sooner.       Influenza A by PCR NEGATIVE NEGATIVE Final   Influenza B by PCR NEGATIVE NEGATIVE Final    Comment: (NOTE) The Xpert Xpress SARS-CoV-2/FLU/RSV plus assay is intended as an aid in the diagnosis of influenza from Nasopharyngeal swab specimens and should not be used as a sole basis for treatment. Nasal washings and aspirates are unacceptable for Xpert Xpress SARS-CoV-2/FLU/RSV testing.  Fact Sheet for Patients: EntrepreneurPulse.com.au  Fact Sheet for Healthcare Providers: IncredibleEmployment.be  This test is not yet approved or cleared by the [place] FDA and has been authorized for detection and/or diagnosis of SARS-CoV-2 by FDA under an Emergency Use Authorization (EUA). This EUA will remain in effect (meaning this test can be used) for the duration of the COVID-19 declaration under Section 564(b)(1) of the Act, 21 U.S.C. section 360bbb-3(b)(1), unless the authorization is terminated or revoked.  Performed at [facility], [place] [address], [place], [place] [address]   Blood culture (routine x 2)     Status: None   Collection Time: 06/19/21  7:14 PM   Specimen: BLOOD LEFT HAND  Result Value Ref Range Status   Specimen Description BLOOD LEFT HAND  Final   Special Requests   Final    BOTTLES DRAWN AEROBIC AND ANAEROBIC Blood Culture results may not be optimal due to an inadequate volume of blood received in culture bottles   Culture   Final    NO GROWTH 5 DAYS Performed at [facility], [place] [address], [place], [place] [address]    Report Status 06/24/2021 FINAL  Final  Body fluid culture w Gram Stain     Status: None (Preliminary result)   Collection Time: 06/22/21  2:13 PM   Specimen: Body Fluid  Result Value Ref Range Status   Specimen Description FLUID PLEURAL RIGHT  Final   Special Requests Immunocompromised   Final   Gram Stain   Final    FEW SQUAMOUS EPITHELIAL CELLS PRESENT FEW WBC SEEN NO ORGANISMS SEEN    Culture   Final    NO GROWTH 3 DAYS Performed at [facility], [address], [place], [place] [address]    Report Status PENDING  Incomplete  Acid Fast Smear (AFB)     Status: None   Collection Time: 06/22/21  2:13 PM   Specimen: Pleural, Right; Pleural Fluid  Result Value Ref Range Status   AFB Specimen Processing Concentration  Final   Acid Fast Smear Negative  Final    Comment: (NOTE) Performed At: [facility] [place], [place] [id number] Rush Farmer MD [id number]  Source (AFB) FLUID  Final    Comment: PLEURAL RIGHT Performed at [facility], [place] [address] [place], [place] [address]      Labs: BNP (last 3 results) Recent Labs    09/19/20 0149 06/19/21 1741  BNP 480.4* [id number]*   Basic Metabolic Panel: Recent Labs  Lab 06/19/21 1741 06/20/21 0325 06/21/21 0214 06/23/21 0846 06/25/21 0127  NA 139 137 136 137 141  K 4.3 4.5 4.6 4.2 3.9  CL 97* 96* 95* 99 101  CO2 '30 26 25 27 29  '$ GLUCOSE 101* 91 100* 114* 110*  BUN 24* 31* 45* 45* 44*  CREATININE 5.76* 6.40* 8.34* 8.70* 7.84*  CALCIUM 8.8* 8.6* 8.6* 8.6* 8.6*  MG  --  1.8  --   --   --   PHOS  --  4.5  --  6.5*  --    Liver Function Tests: Recent Labs  Lab 06/19/21 1741 06/21/21 0214 06/22/21 1529 06/23/21 0846  AST 16 11*  --   --   ALT 10 8  --   --   ALKPHOS 75 60  --   --   BILITOT 0.8 0.9  --   --   PROT 6.6 6.2* 6.1*  --   ALBUMIN 3.6 2.9*  --  2.6*   Recent Labs  Lab 06/19/21 1741  LIPASE 69*   No results for input(s): AMMONIA in the last 168 hours. CBC: Recent Labs  Lab 06/19/21 1741 06/20/21 0325 06/21/21 0214 06/22/21 0119 06/23/21 0142 06/25/21 0127  WBC 9.3 10.3 9.4 7.6 6.3 6.8  NEUTROABS 6.9  --   --   --   --   --   HGB 8.7* 8.2* 7.6* 7.2* 7.8* 8.1*  HCT 30.0* 27.3* 26.5* 23.7* 26.9* 26.5*  MCV 101.7* 101.1* 101.1* 98.3  101.9* 100.0  PLT 175 142* 134* 134* 144* 175   Cardiac Enzymes: No results for input(s): CKTOTAL, CKMB, CKMBINDEX, TROPONINI in the last 168 hours. BNP: Invalid input(s): POCBNP CBG: No results for input(s): GLUCAP in the last 168 hours. D-Dimer No results for input(s): DDIMER in the last 72 hours. Hgb A1c No results for input(s): HGBA1C in the last 72 hours. Lipid Profile No results for input(s): CHOL, HDL, LDLCALC, TRIG, CHOLHDL, LDLDIRECT in the last 72 hours. Thyroid function studies No results for input(s): TSH, T4TOTAL, T3FREE, THYROIDAB in the last 72 hours.  Invalid input(s): FREET3 Anemia work up No results for input(s): VITAMINB12, FOLATE, FERRITIN, TIBC, IRON, RETICCTPCT in the last 72 hours. Urinalysis    Component Value Date/Time   COLORURINE YELLOW 06/19/2021 1721   APPEARANCEUR TURBID (A) 06/19/2021 1721   LABSPEC 1.017 06/19/2021 1721   PHURINE 6.0 06/19/2021 1721   GLUCOSEU 50 (A) 06/19/2021 1721   HGBUR MODERATE (A) 06/19/2021 1721   BILIRUBINUR NEGATIVE 06/19/2021 1721   KETONESUR 5 (A) 06/19/2021 1721   PROTEINUR 100 (A) 06/19/2021 1721   NITRITE NEGATIVE 06/19/2021 1721   LEUKOCYTESUR LARGE (A) 06/19/2021 1721   Sepsis Labs Invalid input(s): PROCALCITONIN,  WBC,  LACTICIDVEN Microbiology Recent Results (from the past 240 hour(s))  Resp Panel by RT-PCR (Flu A&B, Covid) Nasopharyngeal Swab     Status: None   Collection Time: 06/19/21  5:34 PM   Specimen: Nasopharyngeal Swab; Nasopharyngeal(NP) swabs in vial transport medium  Result Value Ref Range Status   SARS Coronavirus 2 by RT PCR NEGATIVE NEGATIVE Final    Comment: (NOTE) SARS-CoV-2 target nucleic acids are NOT DETECTED.  The SARS-CoV-2 RNA is generally  detectable in upper respiratory specimens during the acute phase of infection. The lowest concentration of SARS-CoV-2 viral copies this assay can detect is 138 copies/mL. A negative result does not preclude SARS-Cov-2 infection and should  not be used as the sole basis for treatment or other patient management decisions. A negative result may occur with  improper specimen collection/handling, submission of specimen other than nasopharyngeal swab, presence of viral mutation(s) within the areas targeted by this assay, and inadequate number of viral copies(<138 copies/mL). A negative result must be combined with clinical observations, patient history, and epidemiological information. The expected result is Negative.  Fact Sheet for Patients:  EntrepreneurPulse.com.au  Fact Sheet for Healthcare Providers:  IncredibleEmployment.be  This test is no t yet approved or cleared by the [place] FDA and  has been authorized for detection and/or diagnosis of SARS-CoV-2 by FDA under an Emergency Use Authorization (EUA). This EUA will remain  in effect (meaning this test can be used) for the duration of the COVID-19 declaration under Section 564(b)(1) of the Act, 21 U.S.C.section 360bbb-3(b)(1), unless the authorization is terminated  or revoked sooner.       Influenza A by PCR NEGATIVE NEGATIVE Final   Influenza B by PCR NEGATIVE NEGATIVE Final    Comment: (NOTE) The Xpert Xpress SARS-CoV-2/FLU/RSV plus assay is intended as an aid in the diagnosis of influenza from Nasopharyngeal swab specimens and should not be used as a sole basis for treatment. Nasal washings and aspirates are unacceptable for Xpert Xpress SARS-CoV-2/FLU/RSV testing.  Fact Sheet for Patients: EntrepreneurPulse.com.au  Fact Sheet for Healthcare Providers: IncredibleEmployment.be  This test is not yet approved or cleared by the [place] FDA and has been authorized for detection and/or diagnosis of SARS-CoV-2 by FDA under an Emergency Use Authorization (EUA). This EUA will remain in effect (meaning this test can be used) for the duration of the COVID-19 declaration under  Section 564(b)(1) of the Act, 21 U.S.C. section 360bbb-3(b)(1), unless the authorization is terminated or revoked.  Performed at [facility], [place] [address] [place], [place] [address]   Blood culture (routine x 2)     Status: None   Collection Time: 06/19/21  7:14 PM   Specimen: BLOOD LEFT HAND  Result Value Ref Range Status   Specimen Description BLOOD LEFT HAND  Final   Special Requests   Final    BOTTLES DRAWN AEROBIC AND ANAEROBIC Blood Culture results may not be optimal due to an inadequate volume of blood received in culture bottles   Culture   Final    NO GROWTH 5 DAYS Performed at [facility], [place] [address] [place], [place] [address]    Report Status 06/24/2021 FINAL  Final  Body fluid culture w Gram Stain     Status: None (Preliminary result)   Collection Time: 06/22/21  2:13 PM   Specimen: Body Fluid  Result Value Ref Range Status   Specimen Description FLUID PLEURAL RIGHT  Final   Special Requests Immunocompromised  Final   Gram Stain   Final    FEW SQUAMOUS EPITHELIAL CELLS PRESENT FEW WBC SEEN NO ORGANISMS SEEN    Culture   Final    NO GROWTH 3 DAYS Performed at [facility], [address], [place], [place] [address]    Report Status PENDING  Incomplete  Acid Fast Smear (AFB)     Status: None   Collection Time: 06/22/21  2:13 PM   Specimen: Pleural, Right; Pleural Fluid  Result  Value Ref Range Status   AFB Specimen Processing Concentration  Final   Acid Fast Smear Negative  Final    Comment: (NOTE) Performed At: [facility] [place], [place] [id number] Rush Farmer MD [id number]    Source (AFB) FLUID  Final    Comment: PLEURAL RIGHT Performed at [facility], [place] [address], [place], [place] [address]      Time coordinating discharge: Over 30 minutes  SIGNED:   Caylan Chenard J [place] DO  [facility] 06/25/2021, 11:03 AM

## 2021-06-25 NOTE — Progress Notes (Signed)
Pt discharged to home with family.  Pt's IV removed.  IV team assessed HD cath.  Pt taken off telemetry and CCMD notified. Pt left with all of their personal belongings.  AVS documentation reviewed and sent home with Pt and family and all questions answered.

## 2021-06-25 NOTE — Progress Notes (Signed)
Pt left for dialysis.

## 2021-06-25 NOTE — Progress Notes (Signed)
[facility] ROUNDING NOTE   Subjective:   Interval History: This is a 65 year old gentleman with a history of heart transplantation at [facility] [id number] complicated by prolonged respiratory failure requiring tracheostomy PEG placement complicated by perforation and exploratory laparotomy x2.  He is also end-stage renal disease and presented with right pleural effusion and suspected right lower lobe pneumonia.  Successful dialysis 06/23/2021 Next dialysis will be 06/25/2021.  Appreciate assistance of infectious disease.  Patient stable for discharge from renal standpoint  Blood pressure 117/72 pulse 110 temperature 98.5 O2 sats 96% 2 L nasal cannula  Sodium 141 potassium 3.9 chloride 101 CO2 29 BUN 44 creatinine 7.84 glucose 110 calcium 8.6 hemoglobin 8.1   OP HD: NW MWF   4h  400/500   67kg  2/2 bath  TDC  /RU HeRO (not sure when ready to use)  Hep 2000  - mircera 75 q2, last 8/17  - calcitriol 0.75 po tiw   Objective:  Vital signs in last 24 hours:  Temp:  [97.6 F (36.4 C)-98.6 F (37 C)] 98.5 F (36.9 C) (09/02 0404) Pulse Rate:  [100-116] 100 (09/02 0404) Resp:  [16-20] 17 (09/02 0404) BP: (108-137)/(61-84) 117/72 (09/02 0404) SpO2:  [95 %-100 %] 100 % (09/02 0404) Weight:  [65 kg] 65 kg (09/02 0654)  Weight change: -0.1 kg Filed Weights   06/23/21 0832 06/23/21 1219 06/25/21 0654  Weight: 65.1 kg 63.2 kg 65 kg    Intake/Output: No intake/output data recorded.   Intake/Output this shift:  No intake/output data recorded. Gen alert, no distress, somewhat frail No rash, cyanosis or gangrene Sclera anicteric, throat clear  No jvd or bruits Chest clear bilat to bases, no rales/ wheezing RRR no MRG Abd soft ntnd no mass or ascites +bs GU normal male MS no joint effusions or deformity Ext no LE or UE edema, no wounds or ulcers Neuro is alert, Ox 3 , nf, gen weakness  RIJ TDC/ RUA HeRO AVG+bruit   Basic Metabolic Panel: Recent Labs  Lab 06/19/21 1741  06/20/21 0325 06/21/21 0214 06/23/21 0846 06/25/21 0127  NA 139 137 136 137 141  K 4.3 4.5 4.6 4.2 3.9  CL 97* 96* 95* 99 101  CO2 '30 26 25 27 29  '$ GLUCOSE 101* 91 100* 114* 110*  BUN 24* 31* 45* 45* 44*  CREATININE 5.76* 6.40* 8.34* 8.70* 7.84*  CALCIUM 8.8* 8.6* 8.6* 8.6* 8.6*  MG  --  1.8  --   --   --   PHOS  --  4.5  --  6.5*  --      Liver Function Tests: Recent Labs  Lab 06/19/21 1741 06/21/21 0214 06/22/21 1529 06/23/21 0846  AST 16 11*  --   --   ALT 10 8  --   --   ALKPHOS 75 60  --   --   BILITOT 0.8 0.9  --   --   PROT 6.6 6.2* 6.1*  --   ALBUMIN 3.6 2.9*  --  2.6*    Recent Labs  Lab 06/19/21 1741  LIPASE 69*    No results for input(s): AMMONIA in the last 168 hours.  CBC: Recent Labs  Lab 06/19/21 1741 06/20/21 0325 06/21/21 0214 06/22/21 0119 06/23/21 0142 06/25/21 0127  WBC 9.3 10.3 9.4 7.6 6.3 6.8  NEUTROABS 6.9  --   --   --   --   --   HGB 8.7* 8.2* 7.6* 7.2* 7.8* 8.1*  HCT 30.0* 27.3* 26.5* 23.7*  26.9* 26.5*  MCV 101.7* 101.1* 101.1* 98.3 101.9* 100.0  PLT 175 142* 134* 134* 144* 175     Cardiac Enzymes: No results for input(s): CKTOTAL, CKMB, CKMBINDEX, TROPONINI in the last 168 hours.  BNP: Invalid input(s): POCBNP  CBG: No results for input(s): GLUCAP in the last 168 hours.  Microbiology: Results for orders placed or performed during the hospital encounter of 06/19/21  Resp Panel by RT-PCR (Flu A&B, Covid) Nasopharyngeal Swab     Status: None   Collection Time: 06/19/21  5:34 PM   Specimen: Nasopharyngeal Swab; Nasopharyngeal(NP) swabs in vial transport medium  Result Value Ref Range Status   SARS Coronavirus 2 by RT PCR NEGATIVE NEGATIVE Final    Comment: (NOTE) SARS-CoV-2 target nucleic acids are NOT DETECTED.  The SARS-CoV-2 RNA is generally detectable in upper respiratory specimens during the acute phase of infection. The lowest concentration of SARS-CoV-2 viral copies this assay can detect is 138 copies/mL. A  negative result does not preclude SARS-Cov-2 infection and should not be used as the sole basis for treatment or other patient management decisions. A negative result may occur with  improper specimen collection/handling, submission of specimen other than nasopharyngeal swab, presence of viral mutation(s) within the areas targeted by this assay, and inadequate number of viral copies(<138 copies/mL). A negative result must be combined with clinical observations, patient history, and epidemiological information. The expected result is Negative.  Fact Sheet for Patients:  EntrepreneurPulse.com.au  Fact Sheet for Healthcare Providers:  IncredibleEmployment.be  This test is no t yet approved or cleared by the [place] FDA and  has been authorized for detection and/or diagnosis of SARS-CoV-2 by FDA under an Emergency Use Authorization (EUA). This EUA will remain  in effect (meaning this test can be used) for the duration of the COVID-19 declaration under Section 564(b)(1) of the Act, 21 U.S.C.section 360bbb-3(b)(1), unless the authorization is terminated  or revoked sooner.       Influenza A by PCR NEGATIVE NEGATIVE Final   Influenza B by PCR NEGATIVE NEGATIVE Final    Comment: (NOTE) The Xpert Xpress SARS-CoV-2/FLU/RSV plus assay is intended as an aid in the diagnosis of influenza from Nasopharyngeal swab specimens and should not be used as a sole basis for treatment. Nasal washings and aspirates are unacceptable for Xpert Xpress SARS-CoV-2/FLU/RSV testing.  Fact Sheet for Patients: EntrepreneurPulse.com.au  Fact Sheet for Healthcare Providers: IncredibleEmployment.be  This test is not yet approved or cleared by the [place] FDA and has been authorized for detection and/or diagnosis of SARS-CoV-2 by FDA under an Emergency Use Authorization (EUA). This EUA will remain in effect (meaning this test can  be used) for the duration of the COVID-19 declaration under Section 564(b)(1) of the Act, 21 U.S.C. section 360bbb-3(b)(1), unless the authorization is terminated or revoked.  Performed at [facility], [place] [address], [place], [place] [address]   Blood culture (routine x 2)     Status: None   Collection Time: 06/19/21  7:14 PM   Specimen: BLOOD LEFT HAND  Result Value Ref Range Status   Specimen Description BLOOD LEFT HAND  Final   Special Requests   Final    BOTTLES DRAWN AEROBIC AND ANAEROBIC Blood Culture results may not be optimal due to an inadequate volume of blood received in culture bottles   Culture   Final    NO GROWTH 5 DAYS Performed at [facility], [place] [address] [place], [place] [address]    Report Status  06/24/2021 FINAL  Final  Body fluid culture w Gram Stain     Status: None (Preliminary result)   Collection Time: 06/22/21  2:13 PM   Specimen: Body Fluid  Result Value Ref Range Status   Specimen Description FLUID PLEURAL RIGHT  Final   Special Requests Immunocompromised  Final   Gram Stain   Final    FEW SQUAMOUS EPITHELIAL CELLS PRESENT FEW WBC SEEN NO ORGANISMS SEEN    Culture   Final    NO GROWTH 2 DAYS Performed at [facility], [address], [place], [place] [address]    Report Status PENDING  Incomplete  Acid Fast Smear (AFB)     Status: None   Collection Time: 06/22/21  2:13 PM   Specimen: Pleural, Right; Pleural Fluid  Result Value Ref Range Status   AFB Specimen Processing Concentration  Final   Acid Fast Smear Negative  Final    Comment: (NOTE) Performed At: [facility] [place], [place] [id number] Rush Farmer MD [id number]    Source (AFB) FLUID  Final    Comment: PLEURAL RIGHT Performed at [facility], [place] [address], [place], [place] [address]     Coagulation Studies: No results for input(s): LABPROT, INR in the last 72 hours.  Urinalysis: No results for input(s):  COLORURINE, LABSPEC, PHURINE, GLUCOSEU, HGBUR, BILIRUBINUR, KETONESUR, PROTEINUR, UROBILINOGEN, NITRITE, LEUKOCYTESUR in the last 72 hours.  Invalid input(s): APPERANCEUR     Imaging: DG Chest Port 1 View  Result Date: 06/24/2021 CLINICAL DATA:  Pneumonia, thoracentesis 2 days ago EXAM: PORTABLE CHEST 1 VIEW COMPARISON:  Portable exam [id number] hours compared to 06/22/2021 FINDINGS: BILATERAL jugular lines, RIGHT terminating over RIGHT atrium and LEFT terminating over SVC near cavoatrial junction. Normal heart size post median sternotomy. Mediastinal contours and pulmonary vascularity normal. Bibasilar pleural effusions and atelectasis persists. Upper lungs clear. No pneumothorax or acute osseous findings. IMPRESSION: Persistent bibasilar effusions and atelectasis, slightly greater on RIGHT. Electronically Signed   By: Lavonia Dana M.D.   On: 06/24/2021 10:00     Medications:      atorvastatin  20 mg Oral q morning   calcitRIOL  0.75 mcg Oral Q M,W,F-HD   calcium carbonate  1,250 mg Oral QPC breakfast   cefpodoxime  200 mg Oral Daily   Chlorhexidine Gluconate Cloth  6 each Topical Q0600   darbepoetin (ARANESP) injection - DIALYSIS  60 mcg Intravenous Q Wed-HD   diltiazem  180 mg Oral QPM   midodrine  10 mg Oral Q M,W,F-HD   mirtazapine  45 mg Oral QPM   mycophenolate  180 mg Oral BID   pantoprazole  40 mg Oral q morning   tacrolimus  6 mg Oral BID   acetaminophen **OR** acetaminophen, levalbuterol, ondansetron **OR** ondansetron (ZOFRAN) IV  Assessment/ Plan:  Fever/ sepsis - in immunocomprised pt, per pmd. Suspected lung source, appreciate assistance of infectious disease.  Sig consolidation and/or effusion R lower/ middle lobe by CT. patient transition to oral cefpodoxime. Heart transplant - on prograf and cellcept, per pmd.  ESRD - on HD MWF. Has not missed. HeRO placed 05/15/21, ready to use 8/23.  Next dialysis will be 06/25/2021.  Patient stable for discharge HTN /vol - BP's are wnl,  cont home meds. No edema on exam, CXR / CT w/o edema. Small UF 1-2 L w/ next HD as tol.  A/C resp failure - on prn O2 at home, continuous here.  Anemia ckd - Hb 8's here,  next esa due 8/31, have ordered   noted fairly low iron saturations.  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

## 2021-06-26 ENCOUNTER — Telehealth (HOSPITAL_COMMUNITY): Payer: Self-pay | Admitting: Nephrology

## 2021-06-26 LAB — BODY FLUID CULTURE W GRAM STAIN: Culture: NO GROWTH

## 2021-06-26 LAB — CHOLESTEROL, BODY FLUID: Cholesterol, Fluid: 43 mg/dL

## 2021-06-26 NOTE — Telephone Encounter (Signed)
Transition of care contact from inpatient facility  Date of Discharge: 06/25/21 Date of Contact:06/26/21 - attempted Method of contact: Phone  Attempted to contact patient to discuss transition of care from inpatient admission. Patient did not answer the phone. Message was left on the patient's voicemail with call back number [phone].   Veneta Penton, PA-C [employer] Pager [phone]

## 2021-07-06 ENCOUNTER — Ambulatory Visit: Admit: 2021-07-06 | Discharge: 2021-07-07 | Payer: PRIVATE HEALTH INSURANCE

## 2021-07-06 ENCOUNTER — Ambulatory Visit
Admit: 2021-07-06 | Discharge: 2021-07-07 | Payer: PRIVATE HEALTH INSURANCE | Attending: Infectious Disease | Primary: Infectious Disease

## 2021-07-06 ENCOUNTER — Ambulatory Visit
Admit: 2021-07-06 | Discharge: 2021-07-07 | Payer: PRIVATE HEALTH INSURANCE | Attending: Adult Health | Primary: Adult Health

## 2021-07-06 LAB — MYCOPHENOLIC ACID (CELLCEPT)
MPA Glucuronide: 53 ug/mL (ref 15–125)
MPA: <0.5 ug/mL — ABNORMAL LOW (ref 1.0–3.5)

## 2021-07-06 MED ORDER — MIDODRINE 5 MG TABLET
ORAL_TABLET | 11 refills | 0 days | Status: CP
Start: 2021-07-06 — End: ?

## 2021-07-07 ENCOUNTER — Ambulatory Visit: Admit: 2021-07-07 | Discharge: 2021-07-08 | Payer: PRIVATE HEALTH INSURANCE

## 2021-07-08 ENCOUNTER — Inpatient Hospital Stay: Payer: 59 | Admitting: Primary Care

## 2021-07-08 ENCOUNTER — Other Ambulatory Visit (HOSPITAL_COMMUNITY)
Admission: AD | Admit: 2021-07-08 | Discharge: 2021-07-08 | Disposition: A | Discharge status: Home / Self Care | Payer: 59 | Source: Ambulatory Visit | Attending: Cardiology | Admitting: Cardiology

## 2021-07-08 DIAGNOSIS — R7989 Other specified abnormal findings of blood chemistry: Secondary | ICD-10-CM | POA: Insufficient documentation

## 2021-07-08 DIAGNOSIS — Z48298 Encounter for aftercare following other organ transplant: Secondary | ICD-10-CM | POA: Insufficient documentation

## 2021-07-08 DIAGNOSIS — Z941 Heart transplant status: Secondary | ICD-10-CM | POA: Diagnosis present

## 2021-07-08 DIAGNOSIS — Z79899 Other long term (current) drug therapy: Secondary | ICD-10-CM | POA: Diagnosis not present

## 2021-07-08 LAB — COMPREHENSIVE METABOLIC PANEL
ALT: 13 U/L (ref 0–44)
AST: 17 U/L (ref 15–41)
Albumin: 3.4 g/dL — ABNORMAL LOW (ref 3.5–5.0)
Alkaline Phosphatase: 81 U/L (ref 38–126)
Anion gap: 13 (ref 5–15)
BUN: 21 mg/dL (ref 8–23)
CO2: 33 mmol/L — ABNORMAL HIGH (ref 22–32)
Calcium: 9.1 mg/dL (ref 8.9–10.3)
Chloride: 94 mmol/L — ABNORMAL LOW (ref 98–111)
Creatinine, Ser: 5.33 mg/dL — ABNORMAL HIGH (ref 0.61–1.24)
GFR, Estimated: 11 mL/min — ABNORMAL LOW (ref >60)
Glucose, Bld: 86 mg/dL (ref 70–99)
Potassium: 4 mmol/L (ref 3.5–5.1)
Sodium: 140 mmol/L (ref 135–145)
Total Bilirubin: 0.5 mg/dL (ref 0.3–1.2)
Total Protein: 6.8 g/dL (ref 6.5–8.1)

## 2021-07-08 LAB — CBC WITH DIFFERENTIAL/PLATELET
Abs Immature Granulocytes: 0.62 10*3/uL — ABNORMAL HIGH (ref 0.00–0.07)
Basophils Absolute: 0 10*3/uL (ref 0.0–0.1)
Basophils Relative: 1 %
Eosinophils Absolute: 0.3 10*3/uL (ref 0.0–0.5)
Eosinophils Relative: 4 %
HCT: 30.2 % — ABNORMAL LOW (ref 39.0–52.0)
Hemoglobin: 8.9 g/dL — ABNORMAL LOW (ref 13.0–17.0)
Immature Granulocytes: 8 %
Lymphocytes Relative: 9 %
Lymphs Abs: 0.6 10*3/uL — ABNORMAL LOW (ref 0.7–4.0)
MCH: 29.7 pg (ref 26.0–34.0)
MCHC: 29.5 g/dL — ABNORMAL LOW (ref 30.0–36.0)
MCV: 100.7 fL — ABNORMAL HIGH (ref 80.0–100.0)
Monocytes Absolute: 1 10*3/uL (ref 0.1–1.0)
Monocytes Relative: 14 %
Neutro Abs: 4.8 10*3/uL (ref 1.7–7.7)
Neutrophils Relative %: 64 %
Platelets: 225 10*3/uL (ref 150–400)
RBC: 3 MIL/uL — ABNORMAL LOW (ref 4.22–5.81)
RDW: 14.7 % (ref 11.5–15.5)
WBC: 7.4 10*3/uL (ref 4.0–10.5)
nRBC: 0 % (ref 0.0–0.2)

## 2021-07-09 DIAGNOSIS — Z992 Dependence on renal dialysis: Principal | ICD-10-CM

## 2021-07-09 DIAGNOSIS — N186 End stage renal disease: Principal | ICD-10-CM

## 2021-07-09 DIAGNOSIS — T86298 Other complications of heart transplant: Principal | ICD-10-CM

## 2021-07-10 LAB — TACROLIMUS LEVEL: Tacrolimus (FK506) - LabCorp: 4.8 ng/mL (ref 2.0–20.0)

## 2021-07-13 DIAGNOSIS — T86298 Other complications of heart transplant: Principal | ICD-10-CM

## 2021-07-13 DIAGNOSIS — Z941 Heart transplant status: Principal | ICD-10-CM

## 2021-07-15 ENCOUNTER — Institutional Professional Consult (permissible substitution): Admit: 2021-07-15 | Discharge: 2021-07-15 | Payer: PRIVATE HEALTH INSURANCE

## 2021-07-15 ENCOUNTER — Ambulatory Visit: Admit: 2021-07-15 | Discharge: 2021-07-15 | Payer: PRIVATE HEALTH INSURANCE

## 2021-07-15 DIAGNOSIS — T86298 Other complications of heart transplant: Principal | ICD-10-CM

## 2021-07-15 DIAGNOSIS — N184 Chronic kidney disease, stage 4 (severe): Principal | ICD-10-CM

## 2021-07-15 DIAGNOSIS — N186 End stage renal disease: Principal | ICD-10-CM

## 2021-07-15 DIAGNOSIS — Z125 Encounter for screening for malignant neoplasm of prostate: Principal | ICD-10-CM

## 2021-07-15 DIAGNOSIS — Z992 Dependence on renal dialysis: Principal | ICD-10-CM

## 2021-07-15 DIAGNOSIS — Z01818 Encounter for other preprocedural examination: Principal | ICD-10-CM

## 2021-07-15 DIAGNOSIS — Z114 Encounter for screening for human immunodeficiency virus [HIV]: Principal | ICD-10-CM

## 2021-07-15 DIAGNOSIS — Z941 Heart transplant status: Principal | ICD-10-CM

## 2021-07-15 DIAGNOSIS — Z7682 Awaiting organ transplant status: Principal | ICD-10-CM

## 2021-07-15 DIAGNOSIS — Z01810 Encounter for preprocedural cardiovascular examination: Principal | ICD-10-CM

## 2021-07-15 DIAGNOSIS — Z5181 Encounter for therapeutic drug level monitoring: Principal | ICD-10-CM

## 2021-07-15 DIAGNOSIS — Z7289 Other problems related to lifestyle: Principal | ICD-10-CM

## 2021-07-20 ENCOUNTER — Ambulatory Visit
Admit: 2021-07-20 | Discharge: 2021-07-21 | Payer: PRIVATE HEALTH INSURANCE | Attending: Pulmonary Disease | Primary: Pulmonary Disease

## 2021-07-20 ENCOUNTER — Ambulatory Visit: Admit: 2021-07-20 | Discharge: 2021-07-21 | Payer: PRIVATE HEALTH INSURANCE

## 2021-07-20 DIAGNOSIS — E782 Mixed hyperlipidemia: Principal | ICD-10-CM

## 2021-07-20 DIAGNOSIS — J90 Pleural effusion, not elsewhere classified: Principal | ICD-10-CM

## 2021-07-20 DIAGNOSIS — I10 Essential (primary) hypertension: Principal | ICD-10-CM

## 2021-07-20 DIAGNOSIS — Z941 Heart transplant status: Principal | ICD-10-CM

## 2021-07-20 DIAGNOSIS — Z741 Need for assistance with personal care: Principal | ICD-10-CM

## 2021-07-20 DIAGNOSIS — D849 Immunodeficiency, unspecified: Principal | ICD-10-CM

## 2021-07-22 ENCOUNTER — Ambulatory Visit: Admit: 2021-07-22 | Discharge: 2021-07-22 | Payer: PRIVATE HEALTH INSURANCE

## 2021-07-23 LAB — FUNGAL ORGANISM REFLEX

## 2021-07-23 LAB — FUNGUS CULTURE RESULT

## 2021-07-23 LAB — FUNGUS CULTURE WITH STAIN

## 2021-07-29 DIAGNOSIS — Z941 Heart transplant status: Principal | ICD-10-CM

## 2021-07-29 DIAGNOSIS — T86298 Other complications of heart transplant: Principal | ICD-10-CM

## 2021-08-02 DIAGNOSIS — N186 End stage renal disease: Principal | ICD-10-CM

## 2021-08-02 DIAGNOSIS — Z01818 Encounter for other preprocedural examination: Principal | ICD-10-CM

## 2021-08-02 DIAGNOSIS — N184 Chronic kidney disease, stage 4 (severe): Principal | ICD-10-CM

## 2021-08-05 ENCOUNTER — Ambulatory Visit: Admit: 2021-08-05 | Discharge: 2021-08-06 | Payer: PRIVATE HEALTH INSURANCE

## 2021-08-05 ENCOUNTER — Ambulatory Visit
Admit: 2021-08-05 | Discharge: 2021-08-06 | Payer: PRIVATE HEALTH INSURANCE | Attending: Adult Health | Primary: Adult Health

## 2021-08-05 DIAGNOSIS — Z992 Dependence on renal dialysis: Principal | ICD-10-CM

## 2021-08-05 DIAGNOSIS — Z941 Heart transplant status: Principal | ICD-10-CM

## 2021-08-05 DIAGNOSIS — J90 Pleural effusion, not elsewhere classified: Principal | ICD-10-CM

## 2021-08-05 DIAGNOSIS — T86298 Other complications of heart transplant: Principal | ICD-10-CM

## 2021-08-05 DIAGNOSIS — R0600 Dyspnea, unspecified: Principal | ICD-10-CM

## 2021-08-05 DIAGNOSIS — N186 End stage renal disease: Principal | ICD-10-CM

## 2021-08-05 LAB — ACID FAST CULTURE WITH REFLEXED SENSITIVITIES (MYCOBACTERIA): Acid Fast Culture: NEGATIVE

## 2021-08-05 MED ORDER — MYCOPHENOLATE SODIUM 180 MG TABLET,DELAYED RELEASE
ORAL_TABLET | Freq: Two times a day (BID) | ORAL | 3 refills | 90.00000 days | Status: CP
Start: 2021-08-05 — End: ?

## 2021-08-09 ENCOUNTER — Other Ambulatory Visit (HOSPITAL_COMMUNITY)
Admission: RE | Admit: 2021-08-09 | Discharge: 2021-08-09 | Disposition: A | Discharge status: Home / Self Care | Payer: 59 | Source: Ambulatory Visit | Attending: Cardiology | Admitting: Cardiology

## 2021-08-09 DIAGNOSIS — Z79899 Other long term (current) drug therapy: Secondary | ICD-10-CM | POA: Insufficient documentation

## 2021-08-09 DIAGNOSIS — R7989 Other specified abnormal findings of blood chemistry: Secondary | ICD-10-CM | POA: Diagnosis not present

## 2021-08-09 DIAGNOSIS — Z941 Heart transplant status: Secondary | ICD-10-CM | POA: Diagnosis present

## 2021-08-09 DIAGNOSIS — Z48298 Encounter for aftercare following other organ transplant: Secondary | ICD-10-CM | POA: Diagnosis not present

## 2021-08-09 LAB — CBC WITH DIFFERENTIAL/PLATELET
Abs Immature Granulocytes: 0.61 10*3/uL — ABNORMAL HIGH (ref 0.00–0.07)
Basophils Absolute: 0 10*3/uL (ref 0.0–0.1)
Basophils Relative: 0 %
Eosinophils Absolute: 0.5 10*3/uL (ref 0.0–0.5)
Eosinophils Relative: 6 %
HCT: 36.5 % — ABNORMAL LOW (ref 39.0–52.0)
Hemoglobin: 10.9 g/dL — ABNORMAL LOW (ref 13.0–17.0)
Immature Granulocytes: 7 %
Lymphocytes Relative: 11 %
Lymphs Abs: 1 10*3/uL (ref 0.7–4.0)
MCH: 30.3 pg (ref 26.0–34.0)
MCHC: 29.9 g/dL — ABNORMAL LOW (ref 30.0–36.0)
MCV: 101.4 fL — ABNORMAL HIGH (ref 80.0–100.0)
Monocytes Absolute: 0.9 10*3/uL (ref 0.1–1.0)
Monocytes Relative: 11 %
Neutro Abs: 5.5 10*3/uL (ref 1.7–7.7)
Neutrophils Relative %: 65 %
Platelets: 195 10*3/uL (ref 150–400)
RBC: 3.6 MIL/uL — ABNORMAL LOW (ref 4.22–5.81)
RDW: 16 % — ABNORMAL HIGH (ref 11.5–15.5)
WBC: 8.5 10*3/uL (ref 4.0–10.5)
nRBC: 0 % (ref 0.0–0.2)

## 2021-08-09 LAB — COMPREHENSIVE METABOLIC PANEL
ALT: 17 U/L (ref 0–44)
AST: 16 U/L (ref 15–41)
Albumin: 3.4 g/dL — ABNORMAL LOW (ref 3.5–5.0)
Alkaline Phosphatase: 67 U/L (ref 38–126)
Anion gap: 18 — ABNORMAL HIGH (ref 5–15)
BUN: 58 mg/dL — ABNORMAL HIGH (ref 8–23)
CO2: 25 mmol/L (ref 22–32)
Calcium: 8.9 mg/dL (ref 8.9–10.3)
Chloride: 97 mmol/L — ABNORMAL LOW (ref 98–111)
Creatinine, Ser: 8.95 mg/dL — ABNORMAL HIGH (ref 0.61–1.24)
GFR, Estimated: 6 mL/min — ABNORMAL LOW (ref >60)
Glucose, Bld: 102 mg/dL — ABNORMAL HIGH (ref 70–99)
Potassium: 4.1 mmol/L (ref 3.5–5.1)
Sodium: 140 mmol/L (ref 135–145)
Total Bilirubin: 0.7 mg/dL (ref 0.3–1.2)
Total Protein: 6.8 g/dL (ref 6.5–8.1)

## 2021-08-11 LAB — TACROLIMUS LEVEL: Tacrolimus (FK506) - LabCorp: 7.1 ng/mL (ref 2.0–20.0)

## 2021-08-12 MED ORDER — MIRTAZAPINE 30 MG TABLET
ORAL_TABLET | Freq: Every evening | ORAL | 3 refills | 90 days | Status: CP
Start: 2021-08-12 — End: ?

## 2021-08-20 ENCOUNTER — Ambulatory Visit
Admit: 2021-08-20 | Discharge: 2021-08-21 | Payer: PRIVATE HEALTH INSURANCE | Attending: Cardiovascular Disease | Primary: Cardiovascular Disease

## 2021-08-20 DIAGNOSIS — T86298 Other complications of heart transplant: Principal | ICD-10-CM

## 2021-08-20 DIAGNOSIS — Z941 Heart transplant status: Principal | ICD-10-CM

## 2021-08-20 DIAGNOSIS — Z1211 Encounter for screening for malignant neoplasm of colon: Principal | ICD-10-CM

## 2021-08-20 DIAGNOSIS — G4730 Sleep apnea, unspecified: Principal | ICD-10-CM

## 2021-08-31 ENCOUNTER — Telehealth (HOSPITAL_COMMUNITY): Payer: Self-pay

## 2021-08-31 NOTE — Telephone Encounter (Signed)
Pt is interested in the cardiac rehab. Will pass pt ppw to nurse navigator for review.

## 2021-09-07 DIAGNOSIS — Z941 Heart transplant status: Principal | ICD-10-CM

## 2021-09-09 ENCOUNTER — Ambulatory Visit: Admit: 2021-09-09 | Discharge: 2021-09-10 | Payer: PRIVATE HEALTH INSURANCE

## 2021-09-09 DIAGNOSIS — Z941 Heart transplant status: Principal | ICD-10-CM

## 2021-09-13 ENCOUNTER — Telehealth (HOSPITAL_COMMUNITY): Payer: Self-pay | Admitting: Cardiology

## 2021-09-15 NOTE — Telephone Encounter (Signed)
Called pt insurance [facility] and they stated that they will only cover the cardiac rehab program if its GIVEN by a MD or a license therapist. Which in our cardiac rehab, we have nurses and exercise physiologist given the cardiac rehab. Pt is able to do our cardiac rehab maintenance program for $68 a month and come every Tuesday and Thursday. Called pt to advised of this information, had to leave a message for pt to call back.

## 2021-09-20 ENCOUNTER — Other Ambulatory Visit (HOSPITAL_COMMUNITY)
Admission: RE | Admit: 2021-09-20 | Discharge: 2021-09-20 | Disposition: A | Discharge status: Home / Self Care | Payer: 59 | Source: Ambulatory Visit | Attending: Cardiovascular Disease | Admitting: Cardiovascular Disease

## 2021-09-20 DIAGNOSIS — Z48298 Encounter for aftercare following other organ transplant: Secondary | ICD-10-CM | POA: Insufficient documentation

## 2021-09-20 DIAGNOSIS — Z941 Heart transplant status: Secondary | ICD-10-CM | POA: Insufficient documentation

## 2021-09-20 DIAGNOSIS — Z79899 Other long term (current) drug therapy: Secondary | ICD-10-CM | POA: Insufficient documentation

## 2021-09-20 DIAGNOSIS — R7989 Other specified abnormal findings of blood chemistry: Secondary | ICD-10-CM | POA: Diagnosis present

## 2021-09-20 LAB — COMPREHENSIVE METABOLIC PANEL
ALT: 17 U/L (ref 0–44)
AST: 16 U/L (ref 15–41)
Albumin: 3.5 g/dL (ref 3.5–5.0)
Alkaline Phosphatase: 78 U/L (ref 38–126)
Anion gap: 14 (ref 5–15)
BUN: 51 mg/dL — ABNORMAL HIGH (ref 8–23)
CO2: 29 mmol/L (ref 22–32)
Calcium: 8.5 mg/dL — ABNORMAL LOW (ref 8.9–10.3)
Chloride: 96 mmol/L — ABNORMAL LOW (ref 98–111)
Creatinine, Ser: 8.26 mg/dL — ABNORMAL HIGH (ref 0.61–1.24)
GFR, Estimated: 7 mL/min — ABNORMAL LOW (ref >60)
Glucose, Bld: 104 mg/dL — ABNORMAL HIGH (ref 70–99)
Potassium: 4.1 mmol/L (ref 3.5–5.1)
Sodium: 139 mmol/L (ref 135–145)
Total Bilirubin: 0.6 mg/dL (ref 0.3–1.2)
Total Protein: 6.7 g/dL (ref 6.5–8.1)

## 2021-09-20 LAB — CBC WITH DIFFERENTIAL/PLATELET
Abs Immature Granulocytes: 0.34 10*3/uL — ABNORMAL HIGH (ref 0.00–0.07)
Basophils Absolute: 0.1 10*3/uL (ref 0.0–0.1)
Basophils Relative: 0 %
Eosinophils Absolute: 0.5 10*3/uL (ref 0.0–0.5)
Eosinophils Relative: 4 %
HCT: 37.7 % — ABNORMAL LOW (ref 39.0–52.0)
Hemoglobin: 11.6 g/dL — ABNORMAL LOW (ref 13.0–17.0)
Immature Granulocytes: 3 %
Lymphocytes Relative: 7 %
Lymphs Abs: 0.8 10*3/uL (ref 0.7–4.0)
MCH: 30.2 pg (ref 26.0–34.0)
MCHC: 30.8 g/dL (ref 30.0–36.0)
MCV: 98.2 fL (ref 80.0–100.0)
Monocytes Absolute: 0.8 10*3/uL (ref 0.1–1.0)
Monocytes Relative: 6 %
Neutro Abs: 9.8 10*3/uL — ABNORMAL HIGH (ref 1.7–7.7)
Neutrophils Relative %: 80 %
Platelets: 146 10*3/uL — ABNORMAL LOW (ref 150–400)
RBC: 3.84 MIL/uL — ABNORMAL LOW (ref 4.22–5.81)
RDW: 14.3 % (ref 11.5–15.5)
WBC: 12.3 10*3/uL — ABNORMAL HIGH (ref 4.0–10.5)
nRBC: 0 % (ref 0.0–0.2)

## 2021-09-21 LAB — TACROLIMUS LEVEL: Tacrolimus (FK506) - LabCorp: 7.4 ng/mL (ref 2.0–20.0)

## 2021-09-22 DIAGNOSIS — A0472 Enterocolitis due to Clostridium difficile, not specified as recurrent: Principal | ICD-10-CM

## 2021-09-22 LAB — GASTROINTESTINAL PANEL BY PCR, STOOL (REPLACES STOOL CULTURE)

## 2021-09-22 LAB — C DIFFICILE QUICK SCREEN W PCR REFLEX
C Diff antigen: POSITIVE — AB
C Diff interpretation: DETECTED
C Diff toxin: POSITIVE — AB

## 2021-09-22 MED ORDER — VANCOMYCIN 125 MG CAPSULE
ORAL_CAPSULE | Freq: Four times a day (QID) | ORAL | 0 refills | 10 days | Status: CP
Start: 2021-09-22 — End: 2021-10-02

## 2021-09-24 DIAGNOSIS — Z941 Heart transplant status: Principal | ICD-10-CM

## 2021-09-24 LAB — MISC LABCORP TEST (SEND OUT): Labcorp test code: 818776

## 2021-10-06 ENCOUNTER — Encounter: Admit: 2021-10-06 | Discharge: 2021-10-06 | Payer: PRIVATE HEALTH INSURANCE | Attending: Surgery | Primary: Surgery

## 2021-10-12 ENCOUNTER — Ambulatory Visit: Admit: 2021-10-12 | Discharge: 2021-10-13 | Payer: PRIVATE HEALTH INSURANCE | Attending: Surgical | Primary: Surgical

## 2021-10-12 ENCOUNTER — Ambulatory Visit: Admit: 2021-10-12 | Discharge: 2021-10-13 | Payer: PRIVATE HEALTH INSURANCE

## 2021-10-12 ENCOUNTER — Ambulatory Visit
Admit: 2021-10-12 | Discharge: 2021-10-13 | Payer: PRIVATE HEALTH INSURANCE | Attending: Infectious Disease | Primary: Infectious Disease

## 2021-10-12 DIAGNOSIS — Z941 Heart transplant status: Principal | ICD-10-CM

## 2021-10-12 DIAGNOSIS — I255 Ischemic cardiomyopathy: Principal | ICD-10-CM

## 2021-10-12 DIAGNOSIS — Z01818 Encounter for other preprocedural examination: Principal | ICD-10-CM

## 2021-10-12 DIAGNOSIS — L98499 Non-pressure chronic ulcer of skin of other sites with unspecified severity: Principal | ICD-10-CM

## 2021-10-12 DIAGNOSIS — I10 Essential (primary) hypertension: Principal | ICD-10-CM

## 2021-10-12 DIAGNOSIS — T8189XA Other complications of procedures, not elsewhere classified, initial encounter: Principal | ICD-10-CM

## 2021-10-12 DIAGNOSIS — Z23 Encounter for immunization: Principal | ICD-10-CM

## 2021-10-12 DIAGNOSIS — A0472 Enterocolitis due to Clostridium difficile, not specified as recurrent: Principal | ICD-10-CM

## 2021-10-12 DIAGNOSIS — N186 End stage renal disease: Principal | ICD-10-CM

## 2021-10-12 DIAGNOSIS — Z992 Dependence on renal dialysis: Principal | ICD-10-CM

## 2021-10-12 DIAGNOSIS — T8131XA Disruption of external operation (surgical) wound, not elsewhere classified, initial encounter: Principal | ICD-10-CM

## 2021-10-12 DIAGNOSIS — D849 Immunodeficiency, unspecified: Principal | ICD-10-CM

## 2021-10-12 DIAGNOSIS — G4734 Idiopathic sleep related nonobstructive alveolar hypoventilation: Principal | ICD-10-CM

## 2021-10-12 MED ORDER — LIDOCAINE-PRILOCAINE 2.5 %-2.5 % TOPICAL CREAM
2 refills | 0 days | Status: CP
Start: 2021-10-12 — End: ?

## 2021-10-13 DIAGNOSIS — N186 End stage renal disease: Principal | ICD-10-CM

## 2021-10-13 DIAGNOSIS — Z7682 Awaiting organ transplant status: Principal | ICD-10-CM

## 2021-10-13 DIAGNOSIS — Z01818 Encounter for other preprocedural examination: Principal | ICD-10-CM

## 2021-10-16 ENCOUNTER — Emergency Department (HOSPITAL_COMMUNITY)
Admission: EM | Admit: 2021-10-16 | Discharge: 2021-10-17 | Payer: 59 | Attending: Emergency Medicine | Admitting: Emergency Medicine

## 2021-10-16 ENCOUNTER — Encounter (HOSPITAL_COMMUNITY): Payer: Self-pay | Admitting: Emergency Medicine

## 2021-10-16 ENCOUNTER — Other Ambulatory Visit: Payer: Self-pay

## 2021-10-16 ENCOUNTER — Emergency Department (HOSPITAL_COMMUNITY): Payer: 59

## 2021-10-16 DIAGNOSIS — R0602 Shortness of breath: Secondary | ICD-10-CM | POA: Insufficient documentation

## 2021-10-16 DIAGNOSIS — J690 Pneumonitis due to inhalation of food and vomit: Secondary | ICD-10-CM | POA: Diagnosis not present

## 2021-10-16 DIAGNOSIS — I120 Hypertensive chronic kidney disease with stage 5 chronic kidney disease or end stage renal disease: Secondary | ICD-10-CM | POA: Insufficient documentation

## 2021-10-16 DIAGNOSIS — N186 End stage renal disease: Secondary | ICD-10-CM | POA: Insufficient documentation

## 2021-10-16 DIAGNOSIS — R55 Syncope and collapse: Secondary | ICD-10-CM | POA: Diagnosis present

## 2021-10-16 DIAGNOSIS — Z79899 Other long term (current) drug therapy: Secondary | ICD-10-CM | POA: Insufficient documentation

## 2021-10-16 DIAGNOSIS — Z992 Dependence on renal dialysis: Secondary | ICD-10-CM | POA: Diagnosis not present

## 2021-10-16 DIAGNOSIS — Z20822 Contact with and (suspected) exposure to covid-19: Secondary | ICD-10-CM | POA: Insufficient documentation

## 2021-10-16 LAB — CBC
HCT: 35.3 % — ABNORMAL LOW (ref 39.0–52.0)
Hemoglobin: 10.4 g/dL — ABNORMAL LOW (ref 13.0–17.0)
MCH: 29.5 pg (ref 26.0–34.0)
MCHC: 29.5 g/dL — ABNORMAL LOW (ref 30.0–36.0)
MCV: 100 fL (ref 80.0–100.0)
Platelets: 135 10*3/uL — ABNORMAL LOW (ref 150–400)
RBC: 3.53 MIL/uL — ABNORMAL LOW (ref 4.22–5.81)
RDW: 15.3 % (ref 11.5–15.5)
WBC: 14.8 10*3/uL — ABNORMAL HIGH (ref 4.0–10.5)
nRBC: 0 % (ref 0.0–0.2)

## 2021-10-16 LAB — BASIC METABOLIC PANEL
Anion gap: 13 (ref 5–15)
BUN: 30 mg/dL — ABNORMAL HIGH (ref 8–23)
CO2: 31 mmol/L (ref 22–32)
Calcium: 8.3 mg/dL — ABNORMAL LOW (ref 8.9–10.3)
Chloride: 97 mmol/L — ABNORMAL LOW (ref 98–111)
Creatinine, Ser: 5.49 mg/dL — ABNORMAL HIGH (ref 0.61–1.24)
GFR, Estimated: 11 mL/min — ABNORMAL LOW (ref >60)
Glucose, Bld: 106 mg/dL — ABNORMAL HIGH (ref 70–99)
Potassium: 3.7 mmol/L (ref 3.5–5.1)
Sodium: 141 mmol/L (ref 135–145)

## 2021-10-16 LAB — RESP PANEL BY RT-PCR (FLU A&B, COVID) ARPGX2
Influenza A by PCR: NEGATIVE
Influenza B by PCR: NEGATIVE
SARS Coronavirus 2 by RT PCR: NEGATIVE

## 2021-10-16 LAB — TROPONIN I (HIGH SENSITIVITY)
Troponin I (High Sensitivity): 14 ng/L (ref <18)
Troponin I (High Sensitivity): 15 ng/L (ref <18)

## 2021-10-16 MED ORDER — ACETAMINOPHEN 325 MG PO TABS
650.0000 mg | ORAL_TABLET | Freq: Once | ORAL | Status: AC
  Administered 2021-10-16: 16:00:00 650 mg via ORAL
  Filled 2021-10-16: qty 2

## 2021-10-16 NOTE — ED Notes (Signed)
RT called for bipap, heather RT states she will be down

## 2021-10-16 NOTE — ED Triage Notes (Signed)
Per ems, pt coming from dialysis. Pt almost completed treatment when he became hypotensive, diaphoretic, vomiting and had a syncopal episode for a few seconds. BP 50/30. Pt feels like he aspirated, lungs clear by ems. EMS Spo2 84% RA, pt on 4L [place] 134/67, HR 114. Hx of heart transplant - pt's HR is at baseline 110s. Pt denies pain, dialysis pt MWF.

## 2021-10-16 NOTE — ED Provider Notes (Signed)
°  Physical Exam  BP (!) 151/79    Pulse (!) 140    Temp 98.1 F (36.7 C) (Oral)    Resp (!) 34    Ht 5\' 8"  (1.727 m)    Wt 63 kg    SpO2 99%    BMI 21.13 kg/m   Physical Exam  ED Course/Procedures   Clinical Course as of 10/16/21 1825  Sat Oct 16, 2021  1430 Chest x-ray shows mild cardiomegaly [JK]  1431 Small pleural effusions [JK]  1512 Initial troponin normal [JK]    Clinical Course User Index [JK] Dorie Rank, MD    .Critical Care Performed by: Varney Biles, MD Authorized by: Varney Biles, MD   Critical care provider statement:    Critical care time (minutes):  30   Critical care was necessary to treat or prevent imminent or life-threatening deterioration of the following conditions:  Respiratory failure   Critical care was time spent personally by me on the following activities:  Development of treatment plan with patient or surrogate, discussions with consultants, evaluation of patient's response to treatment, examination of patient, ordering and review of laboratory studies, ordering and review of radiographic studies, ordering and performing treatments and interventions, pulse oximetry, re-evaluation of patient's condition and review of old charts  MDM   Patient with complex cardiac history and cardiac transplant, ESRD on HD comes in after syncope.  He had a syncopal episode while getting his hemodialysis.  Thereafter patient is noted to be tachypneic and in hypoxic respiratory failure.  He is currently on 3 L of oxygen.  He is noted to be breathing about 30 times a minute.  Dr. Tomi Bamberger saw the patient initially and we suspect that he might have aspirated.  Rest of the vital signs are reassuring.  Labs are reassuring.  Patient receives his transplant care at [facility]. I spoke with transplant fellow, Dr. Marnette Burgess and will accept the patient to [facility]  We have placed patient on BiPAP given his work of breathing. The thought is to leave him on BiPAP for about an hour for  trial.            Varney Biles, MD 10/16/21 [place]

## 2021-10-16 NOTE — ED Notes (Signed)
Pt placed on bipap, tolerating well

## 2021-10-16 NOTE — ED Notes (Signed)
Pt mild breathing distress noted. Pt nearly supine in bed with [place] applied. A/ox4, tachypnic, speaking in 4-5 word sentences. Pt sat up fowlers. Pt states he started feeling nauseated and weak at dialysis, and then vomited and passed out. Pt states he remembers this but then blacked out. Pt c/o currently of anxiety and SOB. Pt dim with slight expiratory wheeze throughout. Pt denies CP, nausea, and states he feels better overall and also states that the increase in O2 and repositioning is helping his breathing/.

## 2021-10-16 NOTE — ED Notes (Signed)
Pt demanding BiPap be Dcd. Pt obliged, mask removed, pt placed back on [place] 4LPM as previous.

## 2021-10-16 NOTE — ED Notes (Signed)
[facility] called. 

## 2021-10-16 NOTE — ED Provider Notes (Signed)
[facility] EMERGENCY DEPARTMENT Provider Note   CSN: [id number] Arrival date & time: 10/16/21  1232     History Chief Complaint  Patient presents with   Loss of Consciousness    Rickey Erickson is a 65 y.o. male.   Loss of Consciousness  Patient presents to the emergency room for evaluation of an episode of hypotension and syncope.  Patient has a history of end-stage renal failure on chronic dialysis.  Patient also has history of cardiac transplant.  Patient was at dialysis today and had to go to a different center because of the power.  Usually his dialysis goes over 4 hours but today they were doing it over 3.  Patient has had problems with hypotension in the past related to his dialysis.  He has to take midodrine occasionally during dialysis.  Patient had an episode of hypotension.  He became diaphoretic during that episode and then vomited.  Patient had a syncopal episode.  Patient's blood pressure was noted in the 50s over 30s.  When EMS arrived they noted his oxygen saturation was 84% on room air and he was placed on nasal cannula oxygen.  Patient normally does not require any oxygen.  Right now he still feels like he is having some discomfort with his breathing but he was not having any symptoms preceding this event.  He has not had any fevers or chills.  He is not have any chest pain.  He denies any abdominal pain.  History reviewed. No pertinent past medical history.  Patient Active Problem List   Diagnosis Date Noted   Community acquired pneumonia of right lower lobe of lung    Pleural effusion    Immunocompromised patient ([place]) 06/19/2021   Sinus tachycardia 06/19/2021   Acute respiratory failure with hypoxia ([place]) 12/15/2020   HLD (hyperlipidemia) 09/20/2020   ESRD (end stage renal disease) ([place]) 08/21/2020   Status post heart transplantation ([place]) 02/28/2020   Essential hypertension 04/10/2019    Past Surgical History:  Procedure Laterality Date    HEART TRANSPLANT     THORACENTESIS Right 06/22/2021   Procedure: THORACENTESIS;  Surgeon: Collene Gobble, MD;  Location: [place];  Service: Cardiopulmonary;  Laterality: Right;       No family history on file.  Social History   Tobacco Use   Smoking status: Never   Smokeless tobacco: Never  Substance Use Topics   Alcohol use: Never   Drug use: Never    Home Medications Prior to Admission medications   Medication Sig Start Date End Date Taking? Authorizing Provider  acetaminophen (TYLENOL) 500 MG tablet Take 500 mg by mouth every 6 (six) hours as needed for headache (pain).    [provider]  atorvastatin (LIPITOR) 20 MG tablet Take 20 mg by mouth every morning. 09/10/20   [provider]  calcium carbonate (OSCAL) 1500 (600 Ca) MG TABS tablet Take 1,500 mg by mouth every morning. 08/18/20   [provider]  clopidogrel (PLAVIX) 75 MG tablet Take 75 mg by mouth every morning. 05/20/21   [provider]  diltiazem (CARDIZEM CD) 180 MG 24 hr capsule Take 180 mg by mouth every evening. 05/27/21   [provider]  mirtazapine (REMERON) 45 MG tablet Take 45 mg by mouth every evening. 05/18/21   [provider]  mycophenolate (MYFORTIC) 180 MG EC tablet Take 180 mg by mouth 2 (two) times daily. 06/07/21   [provider]  pantoprazole (PROTONIX) 40 MG tablet Take 40 mg  by mouth every morning. 09/02/20   [provider]  tacrolimus (PROGRAF) 1 MG capsule Take 1 mg by mouth 2 (two) times daily. Take with a 5 mg capsule for a total dose of 6 mg twice daily 09/04/20   [provider]  tacrolimus (PROGRAF) 5 MG capsule Take 5 mg by mouth 2 (two) times daily. Take with a 1 mg capsule for a total dose of 6 mg twice daily 05/10/21   [provider]  torsemide (DEMADEX) 100 MG tablet Take 100 mg by mouth See admin instructions. Take one tablet (100 mg) by mouth on Sunday, Tuesday, Thursday, Saturday mornings  (non-dialysis days) 05/28/21   [provider]    Allergies    Scopolamine and Lorazepam  Review of Systems   Review of Systems  Cardiovascular:  Positive for syncope.  All other systems reviewed and are negative.  Physical Exam Updated Vital Signs BP (!) 151/76    Pulse (!) 110    Temp 98.1 F (36.7 C) (Oral)    Resp (!) 29    Ht 1.727 m (5\' 8" )    Wt 63 kg    SpO2 100%    BMI 21.13 kg/m   Physical Exam Vitals and nursing note reviewed.  Constitutional:      Appearance: He is well-developed. He is not diaphoretic.  HENT:     Head: Normocephalic and atraumatic.     Right Ear: External ear normal.     Left Ear: External ear normal.  Eyes:     General: No scleral icterus.       Right eye: No discharge.        Left eye: No discharge.     Conjunctiva/sclera: Conjunctivae normal.  Neck:     Trachea: No tracheal deviation.  Cardiovascular:     Rate and Rhythm: Normal rate and regular rhythm.  Pulmonary:     Effort: Pulmonary effort is normal. No respiratory distress.     Breath sounds: Normal breath sounds. No stridor. No wheezing or rales.  Abdominal:     General: Bowel sounds are normal. There is no distension.     Palpations: Abdomen is soft.     Tenderness: There is no abdominal tenderness. There is no guarding or rebound.  Musculoskeletal:        General: No tenderness or deformity.     Cervical back: Neck supple.  Skin:    General: Skin is warm and dry.     Findings: No rash.  Neurological:     General: No focal deficit present.     Mental Status: He is alert.     Cranial Nerves: No cranial nerve deficit (no facial droop, extraocular movements intact, no slurred speech).     Sensory: No sensory deficit.     Motor: No abnormal muscle tone or seizure activity.     Coordination: Coordination normal.  Psychiatric:        Mood and Affect: Mood normal.    ED Results / Procedures / Treatments   Labs (all labs ordered are listed, but only abnormal results  are displayed) Labs Reviewed  BASIC METABOLIC PANEL - Abnormal; Notable for the following components:      Result Value   Chloride 97 (*)    Glucose, Bld 106 (*)    BUN 30 (*)    Creatinine, Ser 5.49 (*)    Calcium 8.3 (*)    GFR, Estimated 11 (*)    All other components within normal limits  CBC - Abnormal; Notable for the following components:   WBC 14.8 (*)    RBC 3.53 (*)    Hemoglobin 10.4 (*)    HCT 35.3 (*)    MCHC 29.5 (*)    Platelets 135 (*)    All other components within normal limits  RESP PANEL BY RT-PCR (FLU A&B, COVID) [id number]  TROPONIN I (HIGH SENSITIVITY)  TROPONIN I (HIGH SENSITIVITY)    EKG EKG Interpretation  Date/Time:  Saturday October 16 2021 12:51:04 EST Ventricular Rate:  112 PR Interval:  145 QRS Duration: 112 QT Interval:  371 QTC Calculation: 507 R Axis:   80 Text Interpretation: Sinus tachycardia Incomplete right bundle branch block Prolonged QT interval Since last tracing rate faster Confirmed by Dorie Rank [phone]) on 10/16/2021 12:59:16 PM  Radiology DG Chest 2 View  Result Date: 10/16/2021 CLINICAL DATA:  Dialysis patient, hypotensive diaphoretic episode while on dialysis earlier today. EXAM: CHEST - 2 VIEW COMPARISON:  06/24/2021 FINDINGS: Right IJ HERO dialysis access tip in the right atrium as before. Previous median sternotomy noted. Mild cardiomegaly without CHF. Small pleural effusions with bibasilar atelectasis. No pneumothorax. Trachea midline. Aorta atherosclerotic. IMPRESSION: Mild cardiomegaly. Similar small bilateral pleural effusions and bibasilar atelectasis. Electronically Signed   By: Jerilynn Mages.  Shick M.D.   On: 10/16/2021 13:17    Procedures Procedures   Medications Ordered in ED Medications - No data to display  ED Course  I have reviewed the triage vital signs and the nursing notes.  Pertinent labs & imaging results that were available during my care of the patient were reviewed by me and considered in my medical decision  making (see chart for details).  Clinical Course as of 10/16/21 1601  Sat Oct 16, 2021  1430 Chest x-ray shows mild cardiomegaly [JK]  1431 Small pleural effusions [JK]  1512 Initial troponin normal [JK]    Clinical Course User Index [JK] Dorie Rank, MD   MDM Rules/Calculators/A&P                         Patient presented to the ED for evaluation after syncopal episode.  Suspect this may have been related to his dialysis.  Patient states he has had issues with hypotension in the past.  He normally has to take midodrine.  Patient's dialysis had to be shortened from 4 hours to 3 hours because of the weather and need to go to an alternative dialysis center.  I suspect this may have been the main contributing factor as patient otherwise was feeling well prior to dialysis.  No signs of acute anemia.  No electrolyte abnormalities.  Patient's had a normal heart rhythm here in the ED.  Patient however does have an oxygen requirement.  [facility] was contacted by Dr Eliberto Ivory.  They request transfer.  No beds available at this time.  Pt will be on the wait list.    Final Clinical Impression(s) / ED Diagnoses Final diagnoses:  Syncope, unspecified syncope type  Aspiration pneumonia, unspecified aspiration pneumonia type, unspecified laterality, unspecified part of lung ([place])     Dorie Rank, MD 10/16/21 1601

## 2021-10-16 NOTE — ED Notes (Signed)
Pt transported to Xray. 

## 2021-10-17 ENCOUNTER — Emergency Department (HOSPITAL_COMMUNITY): Payer: 59

## 2021-10-17 ENCOUNTER — Ambulatory Visit: Admit: 2021-10-17 | Discharge: 2021-10-21 | Payer: PRIVATE HEALTH INSURANCE

## 2021-10-17 DIAGNOSIS — I120 Hypertensive chronic kidney disease with stage 5 chronic kidney disease or end stage renal disease: Secondary | ICD-10-CM | POA: Diagnosis not present

## 2021-10-17 DIAGNOSIS — J690 Pneumonitis due to inhalation of food and vomit: Secondary | ICD-10-CM | POA: Diagnosis not present

## 2021-10-17 DIAGNOSIS — R55 Syncope and collapse: Secondary | ICD-10-CM | POA: Diagnosis present

## 2021-10-17 DIAGNOSIS — R0602 Shortness of breath: Secondary | ICD-10-CM | POA: Diagnosis not present

## 2021-10-17 DIAGNOSIS — N186 End stage renal disease: Secondary | ICD-10-CM | POA: Diagnosis not present

## 2021-10-17 DIAGNOSIS — Z992 Dependence on renal dialysis: Secondary | ICD-10-CM | POA: Diagnosis not present

## 2021-10-17 DIAGNOSIS — Z79899 Other long term (current) drug therapy: Secondary | ICD-10-CM | POA: Diagnosis not present

## 2021-10-17 DIAGNOSIS — Z20822 Contact with and (suspected) exposure to covid-19: Secondary | ICD-10-CM | POA: Diagnosis not present

## 2021-10-17 LAB — D-DIMER, QUANTITATIVE: D-Dimer, Quant: 3.83 ug/mL-FEU — ABNORMAL HIGH (ref 0.00–0.50)

## 2021-10-17 MED ORDER — IOPAMIDOL (ISOVUE-370) INJECTION 76%
50.0000 mL | Freq: Once | INTRAVENOUS | Status: AC | PRN
  Administered 2021-10-17: 04:00:00 50 mL via INTRAVENOUS

## 2021-10-17 MED ORDER — PIPERACILLIN-TAZOBACTAM IN DEX 2-0.25 GM/50ML IV SOLN
2.2500 g | Freq: Three times a day (TID) | INTRAVENOUS | Status: DC
  Administered 2021-10-17 (×2): 2.25 g via INTRAVENOUS
  Filled 2021-10-17 (×3): qty 50

## 2021-10-17 NOTE — ED Notes (Signed)
Pt at the bedside on laptop and spouse at the bedside.

## 2021-10-17 NOTE — ED Notes (Signed)
Pt seemed more labored while sitting in bed. RN adjusted O2 from 3L to 4L

## 2021-10-17 NOTE — ED Notes (Signed)
[facility] was called for transport to [facility]

## 2021-10-17 NOTE — ED Notes (Signed)
Pt requested to eat breakfast. Notified Varney Biles MD for diet order.

## 2021-10-17 NOTE — Progress Notes (Signed)
Pharmacy Antibiotic Note  Rickey Erickson is a 65 y.o. male admitted on 10/16/2021 with aspiration pneumonia.  Pharmacy has been consulted for Zosyn dosing.  Plan: Zosyn 2.25g IV Q8H.  Height: 5\' 8"  (172.7 cm) Weight: 63 kg (139 lb) IBW/kg (Calculated) : 68.4  Temp (24hrs), Avg:98.1 F (36.7 C), Min:98.1 F (36.7 C), Max:98.1 F (36.7 C)  Recent Labs  Lab 10/16/21 1341  WBC 14.8*  CREATININE 5.49*    Estimated Creatinine Clearance: 12.1 mL/min (A) (by C-G formula based on SCr of 5.49 mg/dL (H)).    Allergies  Allergen Reactions   Scopolamine Other (See Comments)     Hallucinations Induces severe delirium    Lorazepam Other (See Comments)    Hallucinations Severe somnolence/delirium      Thank you for allowing pharmacy to be a part of this patients care.  Wynona Neat, PharmD, BCPS  10/17/2021 3:04 AM

## 2021-10-17 NOTE — ED Provider Notes (Signed)
Patient holding in the ED for transport to [facility].  History of ESRD on dialysis greater than 1 year, cardiac transplant May 2021 here with syncopal episode.  Has been persistently tachycardic in the 120s and hypoxic in the 80s.  Was hypotensive at dialysis.  Also tachypneic in the 30s.  Initial concern for possible aspiration.  Given his persistent tachycardia, tachypnea, hypoxia and syncope there is concern for pulmonary embolism.  Patient and wife hesitant to receive IV contrast due to his ESRD status.  Discussed with them not contrast would not harm his kidneys at this point as he has been on dialysis for greater than 1 year.  They discussed with [facility] transplant coordinator and eventually agreed.  I discussed with Dr. Dannial Monarch cardiology ICU fellow who agrees no contraindication to contrast as patient has received contrast at their facility.  CT scan shows: IMPRESSION: 1. No evidence of pulmonary embolism. 2. Patchy airspace disease in the lungs bilaterally with bilateral lower lobe consolidation, suspicious for developing pneumonia. 3. Small bilateral pleural effusions.  Patient initiated on IV antibiotics with concern for possible aspiration.  He remains tachycardic and tachypneic but comfortable.  .Critical Care Performed by: Ezequiel Essex, MD Authorized by: Ezequiel Essex, MD   Critical care provider statement:    Critical care time (minutes):  35   Critical care time was exclusive of:  Separately billable procedures and treating other patients   Critical care was necessary to treat or prevent imminent or life-threatening deterioration of the following conditions:  Respiratory failure   Critical care was time spent personally by me on the following activities:  Development of treatment plan with patient or surrogate, discussions with consultants, evaluation of patient's response to treatment, examination of patient, ordering and review of laboratory studies, ordering and review of  radiographic studies, ordering and performing treatments and interventions, pulse oximetry, re-evaluation of patient's condition and review of old charts   I assumed direction of critical care for this patient from another provider in my specialty: yes     Care discussed with: accepting provider at another facility      Ezequiel Essex, MD 10/17/21 [phone]

## 2021-10-17 NOTE — ED Notes (Signed)
Pt states he would like to call his doctors at [facility] about receiving contrast because he has been told by all of his doctors to not have contrast and he has not received it because of ESRD. Pt states he would like to hold off on CT until this is done. MD Rancour informed

## 2021-10-19 MED ORDER — ASPIRIN 81 MG TABLET,DELAYED RELEASE
ORAL_TABLET | Freq: Every day | ORAL | 11 refills | 30 days
Start: 2021-10-19 — End: ?

## 2021-10-20 MED ORDER — DOXYCYCLINE HYCLATE 100 MG CAPSULE
ORAL_CAPSULE | Freq: Two times a day (BID) | ORAL | 0 refills | 5.00000 days | Status: CP
Start: 2021-10-20 — End: 2021-10-25

## 2021-10-20 MED ORDER — ASPIRIN 81 MG TABLET,DELAYED RELEASE
ORAL_TABLET | Freq: Every day | ORAL | 11 refills | 30 days | Status: CP
Start: 2021-10-20 — End: ?

## 2021-10-26 ENCOUNTER — Ambulatory Visit: Admit: 2021-10-26 | Discharge: 2021-10-27 | Payer: PRIVATE HEALTH INSURANCE

## 2021-10-26 DIAGNOSIS — Z941 Heart transplant status: Principal | ICD-10-CM

## 2021-11-02 ENCOUNTER — Ambulatory Visit
Admit: 2021-11-02 | Discharge: 2021-11-03 | Payer: PRIVATE HEALTH INSURANCE | Attending: Adult Health | Primary: Adult Health

## 2021-11-02 ENCOUNTER — Ambulatory Visit: Admit: 2021-11-02 | Discharge: 2021-11-03 | Payer: PRIVATE HEALTH INSURANCE

## 2021-11-02 ENCOUNTER — Encounter
Admit: 2021-11-02 | Discharge: 2021-11-03 | Payer: PRIVATE HEALTH INSURANCE | Attending: Adult Health | Primary: Adult Health

## 2021-11-02 ENCOUNTER — Ambulatory Visit: Admit: 2021-11-02 | Discharge: 2021-11-02 | Payer: PRIVATE HEALTH INSURANCE

## 2021-11-02 MED ORDER — TACROLIMUS 5 MG CAPSULE, IMMEDIATE-RELEASE
ORAL_CAPSULE | 3 refills | 0 days | Status: CP
Start: 2021-11-02 — End: ?

## 2021-11-02 MED ORDER — TACROLIMUS 1 MG CAPSULE, IMMEDIATE-RELEASE
ORAL_CAPSULE | 3 refills | 0 days | Status: CP
Start: 2021-11-02 — End: ?

## 2021-11-04 MED ORDER — FIDAXOMICIN 200 MG TABLET
ORAL_TABLET | Freq: Two times a day (BID) | ORAL | 0 refills | 10 days | Status: CP
Start: 2021-11-04 — End: 2021-11-14

## 2021-11-10 ENCOUNTER — Encounter: Admit: 2021-11-10 | Discharge: 2021-11-10 | Payer: PRIVATE HEALTH INSURANCE | Attending: Surgery | Primary: Surgery

## 2021-11-15 DIAGNOSIS — Z01818 Encounter for other preprocedural examination: Principal | ICD-10-CM

## 2021-11-15 DIAGNOSIS — Z7682 Awaiting organ transplant status: Principal | ICD-10-CM

## 2021-11-15 DIAGNOSIS — N186 End stage renal disease: Principal | ICD-10-CM

## 2021-11-18 DIAGNOSIS — Z941 Heart transplant status: Principal | ICD-10-CM

## 2021-11-18 MED ORDER — AMOXICILLIN 500 MG CAPSULE
ORAL_CAPSULE | 3 refills | 0 days | Status: CP
Start: 2021-11-18 — End: ?

## 2021-11-30 DIAGNOSIS — A0472 Enterocolitis due to Clostridium difficile, not specified as recurrent: Principal | ICD-10-CM

## 2021-11-30 MED ORDER — FIDAXOMICIN 200 MG TABLET
ORAL_TABLET | Freq: Two times a day (BID) | ORAL | 0 refills | 10 days | Status: CP
Start: 2021-11-30 — End: 2021-12-10

## 2021-12-01 DIAGNOSIS — Z941 Heart transplant status: Principal | ICD-10-CM

## 2021-12-02 ENCOUNTER — Ambulatory Visit: Admit: 2021-12-02 | Discharge: 2021-12-02 | Payer: PRIVATE HEALTH INSURANCE

## 2021-12-02 DIAGNOSIS — Z8619 Personal history of other infectious and parasitic diseases: Principal | ICD-10-CM

## 2021-12-02 DIAGNOSIS — Z992 Dependence on renal dialysis: Principal | ICD-10-CM

## 2021-12-02 DIAGNOSIS — R197 Diarrhea, unspecified: Principal | ICD-10-CM

## 2021-12-02 DIAGNOSIS — D849 Immunodeficiency, unspecified: Principal | ICD-10-CM

## 2021-12-02 DIAGNOSIS — N186 End stage renal disease: Principal | ICD-10-CM

## 2021-12-02 DIAGNOSIS — Z941 Heart transplant status: Principal | ICD-10-CM

## 2021-12-03 ENCOUNTER — Other Ambulatory Visit (HOSPITAL_COMMUNITY)
Admit: 2021-12-03 | Discharge: 2021-12-03 | Disposition: A | Discharge status: Home / Self Care | Payer: 59 | Source: Ambulatory Visit | Attending: Cardiovascular Disease | Admitting: Cardiovascular Disease

## 2021-12-03 DIAGNOSIS — R197 Diarrhea, unspecified: Secondary | ICD-10-CM | POA: Insufficient documentation

## 2021-12-03 DIAGNOSIS — Z941 Heart transplant status: Secondary | ICD-10-CM | POA: Diagnosis present

## 2021-12-03 DIAGNOSIS — Z79899 Other long term (current) drug therapy: Secondary | ICD-10-CM | POA: Diagnosis not present

## 2021-12-03 LAB — C DIFFICILE QUICK SCREEN W PCR REFLEX
C Diff antigen: POSITIVE — AB
C Diff toxin: NEGATIVE

## 2021-12-03 LAB — CLOSTRIDIUM DIFFICILE BY PCR, REFLEXED: Toxigenic C. Difficile by PCR: NEGATIVE

## 2021-12-04 LAB — GASTROINTESTINAL PANEL BY PCR, STOOL (REPLACES STOOL CULTURE)

## 2021-12-09 DIAGNOSIS — A0472 Enterocolitis due to Clostridium difficile, not specified as recurrent: Principal | ICD-10-CM

## 2021-12-11 MED ORDER — VANCOMYCIN 125 MG CAPSULE
ORAL_CAPSULE | Freq: Every day | ORAL | 3 refills | 30 days | Status: CP
Start: 2021-12-11 — End: ?

## 2021-12-22 ENCOUNTER — Encounter: Admit: 2021-12-22 | Discharge: 2021-12-22 | Payer: PRIVATE HEALTH INSURANCE | Attending: Surgery | Primary: Surgery

## 2021-12-23 DIAGNOSIS — E559 Vitamin D deficiency, unspecified: Principal | ICD-10-CM

## 2021-12-23 DIAGNOSIS — Z79899 Other long term (current) drug therapy: Principal | ICD-10-CM

## 2021-12-23 DIAGNOSIS — Z941 Heart transplant status: Principal | ICD-10-CM

## 2021-12-23 DIAGNOSIS — E785 Hyperlipidemia, unspecified: Principal | ICD-10-CM

## 2021-12-24 ENCOUNTER — Ambulatory Visit: Admit: 2021-12-24 | Discharge: 2021-12-24 | Payer: PRIVATE HEALTH INSURANCE

## 2021-12-24 DIAGNOSIS — A0471 Enterocolitis due to Clostridium difficile, recurrent: Principal | ICD-10-CM

## 2021-12-24 MED ORDER — CLENPIQ 10 MG-3.5 GRAM-12 GRAM/160 ML ORAL SOLUTION
Freq: Every day | ORAL | 0 refills | 2 days | Status: CP
Start: 2021-12-24 — End: 2021-12-26

## 2021-12-27 ENCOUNTER — Ambulatory Visit: Admit: 2021-12-27 | Discharge: 2021-12-28 | Payer: PRIVATE HEALTH INSURANCE

## 2021-12-27 DIAGNOSIS — Z01818 Encounter for other preprocedural examination: Principal | ICD-10-CM

## 2021-12-27 DIAGNOSIS — Z7682 Awaiting organ transplant status: Principal | ICD-10-CM

## 2021-12-27 DIAGNOSIS — N186 End stage renal disease: Principal | ICD-10-CM

## 2021-12-29 ENCOUNTER — Other Ambulatory Visit (HOSPITAL_COMMUNITY)
Admission: RE | Admit: 2021-12-29 | Discharge: 2021-12-29 | Disposition: A | Discharge status: Home / Self Care | Payer: 59 | Source: Ambulatory Visit | Attending: Cardiovascular Disease | Admitting: Cardiovascular Disease

## 2021-12-29 DIAGNOSIS — Z941 Heart transplant status: Secondary | ICD-10-CM | POA: Insufficient documentation

## 2021-12-29 DIAGNOSIS — Z48298 Encounter for aftercare following other organ transplant: Secondary | ICD-10-CM | POA: Diagnosis not present

## 2021-12-29 DIAGNOSIS — Z79899 Other long term (current) drug therapy: Secondary | ICD-10-CM | POA: Diagnosis not present

## 2021-12-29 DIAGNOSIS — R7989 Other specified abnormal findings of blood chemistry: Secondary | ICD-10-CM | POA: Diagnosis present

## 2021-12-29 LAB — BASIC METABOLIC PANEL
Anion gap: 16 — ABNORMAL HIGH (ref 5–15)
BUN: 63 mg/dL — ABNORMAL HIGH (ref 8–23)
CO2: 29 mmol/L (ref 22–32)
Calcium: 8.4 mg/dL — ABNORMAL LOW (ref 8.9–10.3)
Chloride: 95 mmol/L — ABNORMAL LOW (ref 98–111)
Creatinine, Ser: 7.9 mg/dL — ABNORMAL HIGH (ref 0.61–1.24)
GFR, Estimated: 7 mL/min — ABNORMAL LOW (ref >60)
Glucose, Bld: 113 mg/dL — ABNORMAL HIGH (ref 70–99)
Potassium: 4.7 mmol/L (ref 3.5–5.1)
Sodium: 140 mmol/L (ref 135–145)

## 2021-12-29 LAB — CBC WITH DIFFERENTIAL/PLATELET
Abs Immature Granulocytes: 0.13 10*3/uL — ABNORMAL HIGH (ref 0.00–0.07)
Basophils Absolute: 0.1 10*3/uL (ref 0.0–0.1)
Basophils Relative: 1 %
Eosinophils Absolute: 1 10*3/uL — ABNORMAL HIGH (ref 0.0–0.5)
Eosinophils Relative: 9 %
HCT: 43.3 % (ref 39.0–52.0)
Hemoglobin: 13.4 g/dL (ref 13.0–17.0)
Immature Granulocytes: 1 %
Lymphocytes Relative: 10 %
Lymphs Abs: 1.1 10*3/uL (ref 0.7–4.0)
MCH: 30 pg (ref 26.0–34.0)
MCHC: 30.9 g/dL (ref 30.0–36.0)
MCV: 96.9 fL (ref 80.0–100.0)
Monocytes Absolute: 0.7 10*3/uL (ref 0.1–1.0)
Monocytes Relative: 6 %
Neutro Abs: 7.9 10*3/uL — ABNORMAL HIGH (ref 1.7–7.7)
Neutrophils Relative %: 73 %
Platelets: 149 10*3/uL — ABNORMAL LOW (ref 150–400)
RBC: 4.47 MIL/uL (ref 4.22–5.81)
RDW: 13.9 % (ref 11.5–15.5)
WBC: 10.8 10*3/uL — ABNORMAL HIGH (ref 4.0–10.5)
nRBC: 0 % (ref 0.0–0.2)

## 2021-12-29 LAB — MAGNESIUM: Magnesium: 2.1 mg/dL (ref 1.7–2.4)

## 2021-12-31 LAB — TACROLIMUS LEVEL: Tacrolimus (FK506) - LabCorp: 10.1 ng/mL (ref 2.0–20.0)

## 2022-01-06 ENCOUNTER — Other Ambulatory Visit (HOSPITAL_COMMUNITY)
Admission: AD | Admit: 2022-01-06 | Discharge: 2022-01-06 | Disposition: A | Discharge status: Home / Self Care | Payer: 59 | Source: Ambulatory Visit | Attending: Cardiovascular Disease | Admitting: Cardiovascular Disease

## 2022-01-06 DIAGNOSIS — Z941 Heart transplant status: Secondary | ICD-10-CM | POA: Insufficient documentation

## 2022-01-06 DIAGNOSIS — Z48298 Encounter for aftercare following other organ transplant: Secondary | ICD-10-CM | POA: Diagnosis not present

## 2022-01-06 DIAGNOSIS — R7989 Other specified abnormal findings of blood chemistry: Secondary | ICD-10-CM | POA: Insufficient documentation

## 2022-01-06 DIAGNOSIS — Z79899 Other long term (current) drug therapy: Secondary | ICD-10-CM | POA: Diagnosis not present

## 2022-01-06 LAB — COMPREHENSIVE METABOLIC PANEL
ALT: 25 U/L (ref 0–44)
AST: 21 U/L (ref 15–41)
Albumin: 3.5 g/dL (ref 3.5–5.0)
Alkaline Phosphatase: 68 U/L (ref 38–126)
Anion gap: 17 — ABNORMAL HIGH (ref 5–15)
BUN: 40 mg/dL — ABNORMAL HIGH (ref 8–23)
CO2: 29 mmol/L (ref 22–32)
Calcium: 8.1 mg/dL — ABNORMAL LOW (ref 8.9–10.3)
Chloride: 92 mmol/L — ABNORMAL LOW (ref 98–111)
Creatinine, Ser: 5.55 mg/dL — ABNORMAL HIGH (ref 0.61–1.24)
GFR, Estimated: 11 mL/min — ABNORMAL LOW (ref >60)
Glucose, Bld: 89 mg/dL (ref 70–99)
Potassium: 4.2 mmol/L (ref 3.5–5.1)
Sodium: 138 mmol/L (ref 135–145)
Total Bilirubin: 0.4 mg/dL (ref 0.3–1.2)
Total Protein: 7.1 g/dL (ref 6.5–8.1)

## 2022-01-06 LAB — CBC WITH DIFFERENTIAL/PLATELET
Abs Immature Granulocytes: 0.48 10*3/uL — ABNORMAL HIGH (ref 0.00–0.07)
Basophils Absolute: 0 10*3/uL (ref 0.0–0.1)
Basophils Relative: 0 %
Eosinophils Absolute: 0.9 10*3/uL — ABNORMAL HIGH (ref 0.0–0.5)
Eosinophils Relative: 10 %
HCT: 40.8 % (ref 39.0–52.0)
Hemoglobin: 12.5 g/dL — ABNORMAL LOW (ref 13.0–17.0)
Immature Granulocytes: 5 %
Lymphocytes Relative: 7 %
Lymphs Abs: 0.7 10*3/uL (ref 0.7–4.0)
MCH: 29.9 pg (ref 26.0–34.0)
MCHC: 30.6 g/dL (ref 30.0–36.0)
MCV: 97.6 fL (ref 80.0–100.0)
Monocytes Absolute: 0.7 10*3/uL (ref 0.1–1.0)
Monocytes Relative: 7 %
Neutro Abs: 6.9 10*3/uL (ref 1.7–7.7)
Neutrophils Relative %: 71 %
Platelets: 133 10*3/uL — ABNORMAL LOW (ref 150–400)
RBC: 4.18 MIL/uL — ABNORMAL LOW (ref 4.22–5.81)
RDW: 13.8 % (ref 11.5–15.5)
WBC: 9.7 10*3/uL (ref 4.0–10.5)
nRBC: 0 % (ref 0.0–0.2)

## 2022-01-06 LAB — LIPID PANEL
Cholesterol: 138 mg/dL (ref 0–200)
HDL: 30 mg/dL — ABNORMAL LOW (ref >40)
LDL Cholesterol: 90 mg/dL (ref 0–99)
Total CHOL/HDL Ratio: 4.6 RATIO
Triglycerides: 90 mg/dL (ref <150)
VLDL: 18 mg/dL (ref 0–40)

## 2022-01-06 LAB — VITAMIN D 25 HYDROXY (VIT D DEFICIENCY, FRACTURES): Vit D, 25-Hydroxy: 53.37 ng/mL (ref 30–100)

## 2022-01-06 LAB — TSH: TSH: 2.757 u[IU]/mL (ref 0.350–4.500)

## 2022-01-06 LAB — MAGNESIUM: Magnesium: 1.9 mg/dL (ref 1.7–2.4)

## 2022-01-07 LAB — HEMOGLOBIN A1C
Hgb A1c MFr Bld: 4.9 % (ref 4.8–5.6)
Mean Plasma Glucose: 94 mg/dL

## 2022-01-08 LAB — TACROLIMUS LEVEL: Tacrolimus (FK506) - LabCorp: 8.3 ng/mL (ref 2.0–20.0)

## 2022-01-13 ENCOUNTER — Ambulatory Visit: Admit: 2022-01-13 | Discharge: 2022-01-14 | Payer: PRIVATE HEALTH INSURANCE

## 2022-01-13 DIAGNOSIS — Z941 Heart transplant status: Principal | ICD-10-CM

## 2022-01-13 DIAGNOSIS — E782 Mixed hyperlipidemia: Principal | ICD-10-CM

## 2022-01-13 DIAGNOSIS — Z9181 History of falling: Principal | ICD-10-CM

## 2022-01-13 DIAGNOSIS — G4734 Idiopathic sleep related nonobstructive alveolar hypoventilation: Principal | ICD-10-CM

## 2022-01-18 DIAGNOSIS — Z941 Heart transplant status: Principal | ICD-10-CM

## 2022-01-18 MED ORDER — TACROLIMUS 5 MG CAPSULE, IMMEDIATE-RELEASE
ORAL_CAPSULE | 3 refills | 0 days | Status: CP
Start: 2022-01-18 — End: ?

## 2022-01-18 MED ORDER — TACROLIMUS 1 MG CAPSULE, IMMEDIATE-RELEASE
ORAL_CAPSULE | 3 refills | 0 days | Status: CP
Start: 2022-01-18 — End: ?

## 2022-01-25 DIAGNOSIS — U071 COVID-19: Principal | ICD-10-CM

## 2022-01-25 MED ORDER — CODEINE 10 MG-GUAIFENESIN 100 MG/5 ML ORAL LIQUID
Freq: Three times a day (TID) | ORAL | 0 refills | 5 days | Status: CP | PRN
Start: 2022-01-25 — End: ?

## 2022-01-25 MED ORDER — MOLNUPIRAVIR 200 MG CAPSULE (EUA)
ORAL_CAPSULE | Freq: Two times a day (BID) | ORAL | 0 refills | 5 days | Status: CP
Start: 2022-01-25 — End: 2022-01-30

## 2022-01-26 ENCOUNTER — Encounter: Admit: 2022-01-26 | Discharge: 2022-01-26 | Payer: PRIVATE HEALTH INSURANCE | Attending: Surgery | Primary: Surgery

## 2022-01-27 DIAGNOSIS — J9611 Chronic respiratory failure with hypoxia: Principal | ICD-10-CM

## 2022-01-31 DIAGNOSIS — Z7682 Awaiting organ transplant status: Principal | ICD-10-CM

## 2022-01-31 DIAGNOSIS — N186 End stage renal disease: Principal | ICD-10-CM

## 2022-01-31 DIAGNOSIS — Z01818 Encounter for other preprocedural examination: Principal | ICD-10-CM

## 2022-02-10 ENCOUNTER — Ambulatory Visit (HOSPITAL_COMMUNITY)
Admission: RE | Admit: 2022-02-10 | Discharge: 2022-02-10 | Disposition: A | Discharge status: Home / Self Care | Payer: 59 | Source: Ambulatory Visit | Attending: Internal Medicine | Admitting: Internal Medicine

## 2022-02-10 ENCOUNTER — Other Ambulatory Visit (HOSPITAL_COMMUNITY): Payer: Self-pay | Admitting: Internal Medicine

## 2022-02-10 DIAGNOSIS — R059 Cough, unspecified: Secondary | ICD-10-CM | POA: Diagnosis present

## 2022-02-10 DIAGNOSIS — R052 Subacute cough: Principal | ICD-10-CM

## 2022-02-10 LAB — CBC WITH DIFFERENTIAL/PLATELET
Abs Immature Granulocytes: 0.12 10*3/uL — ABNORMAL HIGH (ref 0.00–0.07)
Basophils Absolute: 0 10*3/uL (ref 0.0–0.1)
Basophils Relative: 1 %
Eosinophils Absolute: 0.7 10*3/uL — ABNORMAL HIGH (ref 0.0–0.5)
Eosinophils Relative: 8 %
HCT: 38.3 % — ABNORMAL LOW (ref 39.0–52.0)
Hemoglobin: 11.5 g/dL — ABNORMAL LOW (ref 13.0–17.0)
Immature Granulocytes: 2 %
Lymphocytes Relative: 12 %
Lymphs Abs: 0.9 10*3/uL (ref 0.7–4.0)
MCH: 29.8 pg (ref 26.0–34.0)
MCHC: 30 g/dL (ref 30.0–36.0)
MCV: 99.2 fL (ref 80.0–100.0)
Monocytes Absolute: 0.6 10*3/uL (ref 0.1–1.0)
Monocytes Relative: 8 %
Neutro Abs: 5.6 10*3/uL (ref 1.7–7.7)
Neutrophils Relative %: 69 %
Platelets: 187 10*3/uL (ref 150–400)
RBC: 3.86 MIL/uL — ABNORMAL LOW (ref 4.22–5.81)
RDW: 14.6 % (ref 11.5–15.5)
WBC: 7.9 10*3/uL (ref 4.0–10.5)
nRBC: 0 % (ref 0.0–0.2)

## 2022-02-10 LAB — COMPREHENSIVE METABOLIC PANEL
ALT: 42 U/L (ref 0–44)
AST: 25 U/L (ref 15–41)
Albumin: 3.9 g/dL (ref 3.5–5.0)
Alkaline Phosphatase: 91 U/L (ref 38–126)
Anion gap: 12 (ref 5–15)
BUN: 35 mg/dL — ABNORMAL HIGH (ref 8–23)
CO2: 27 mmol/L (ref 22–32)
Calcium: 9.2 mg/dL (ref 8.9–10.3)
Chloride: 100 mmol/L (ref 98–111)
Creatinine, Ser: 6.01 mg/dL — ABNORMAL HIGH (ref 0.61–1.24)
GFR, Estimated: 10 mL/min — ABNORMAL LOW (ref >60)
Glucose, Bld: 90 mg/dL (ref 70–99)
Potassium: 4.5 mmol/L (ref 3.5–5.1)
Sodium: 139 mmol/L (ref 135–145)
Total Bilirubin: 0.9 mg/dL (ref 0.3–1.2)
Total Protein: 7.7 g/dL (ref 6.5–8.1)

## 2022-02-12 LAB — TACROLIMUS LEVEL: Tacrolimus (FK506) - LabCorp: 8 ng/mL (ref 2.0–20.0)

## 2022-02-18 MED ORDER — PEG 3350-ELECTROLYTES 236 GRAM-22.74 GRAM-6.74 GRAM-5.86 GRAM SOLUTION
Freq: Once | ORAL | 0 refills | 1 days | Status: CP
Start: 2022-02-18 — End: 2022-02-19

## 2022-02-23 MED FILL — PEG 3350-ELECTROLYTES 236 GRAM-22.74 GRAM-6.74 GRAM-5.86 GRAM SOLUTION: ORAL | 1 days supply | Qty: 4000 | Fill #0

## 2022-03-11 ENCOUNTER — Encounter: Admit: 2022-03-11 | Discharge: 2022-03-11 | Payer: PRIVATE HEALTH INSURANCE

## 2022-03-11 ENCOUNTER — Ambulatory Visit: Admit: 2022-03-11 | Discharge: 2022-03-11 | Payer: PRIVATE HEALTH INSURANCE

## 2022-03-15 DIAGNOSIS — K21 Gastroesophageal reflux disease with esophagitis without hemorrhage: Principal | ICD-10-CM

## 2022-03-15 MED ORDER — PANTOPRAZOLE 40 MG TABLET,DELAYED RELEASE
ORAL_TABLET | 3 refills | 0 days | Status: CP
Start: 2022-03-15 — End: ?

## 2022-03-22 ENCOUNTER — Encounter (HOSPITAL_BASED_OUTPATIENT_CLINIC_OR_DEPARTMENT_OTHER): Payer: Self-pay

## 2022-03-22 DIAGNOSIS — R5383 Other fatigue: Secondary | ICD-10-CM

## 2022-03-22 DIAGNOSIS — R0683 Snoring: Secondary | ICD-10-CM

## 2022-03-29 ENCOUNTER — Ambulatory Visit (HOSPITAL_BASED_OUTPATIENT_CLINIC_OR_DEPARTMENT_OTHER): Payer: 59 | Attending: Internal Medicine | Admitting: Internal Medicine

## 2022-03-29 VITALS — Ht 68.0 in | Wt 133.0 lb

## 2022-03-29 DIAGNOSIS — R0683 Snoring: Secondary | ICD-10-CM | POA: Diagnosis not present

## 2022-03-29 DIAGNOSIS — R5383 Other fatigue: Secondary | ICD-10-CM | POA: Diagnosis present

## 2022-03-29 DIAGNOSIS — G4736 Sleep related hypoventilation in conditions classified elsewhere: Secondary | ICD-10-CM | POA: Diagnosis not present

## 2022-03-29 DIAGNOSIS — I493 Ventricular premature depolarization: Secondary | ICD-10-CM | POA: Insufficient documentation

## 2022-03-29 DIAGNOSIS — G4733 Obstructive sleep apnea (adult) (pediatric): Secondary | ICD-10-CM | POA: Insufficient documentation

## 2022-03-29 DIAGNOSIS — Z6820 Body mass index (BMI) 20.0-20.9, adult: Secondary | ICD-10-CM | POA: Diagnosis not present

## 2022-04-02 NOTE — Procedures (Signed)
     Patient Name: Rickey Erickson, Rickey Erickson Date: 03/29/2022 Gender: Male D.O.B: 20-Jul-1956 Age (years): 30 Referring Provider: Jordan Likes MD Height (inches): 68 Interpreting Physician: Baird Lyons MD, ABSM Weight (lbs): 133 RPSGT: Laren Everts BMI: 20 MRN: [id number] Neck Size: 16.75 <br> <br> CLINICAL INFORMATION Sleep Study Type: NPSG Indication for sleep study: Fatigue, Hypertension Epworth Sleepiness Score: 0  SLEEP STUDY TECHNIQUE As per the AASM Manual for the Scoring of Sleep and Associated Events v2.3 (April 2016) with a hypopnea requiring 4% desaturations.  The channels recorded and monitored were frontal, central and occipital EEG, electrooculogram (EOG), submentalis EMG (chin), nasal and oral airflow, thoracic and abdominal wall motion, anterior tibialis EMG, snore microphone, electrocardiogram, and pulse oximetry.  MEDICATIONS Medications self-administered by patient taken the night of the study : none reported  SLEEP ARCHITECTURE The study was initiated at 9:53:13 PM and ended at 5:06:54 AM.  Sleep onset time was 45.3 minutes and the sleep efficiency was 82.5%%. The total sleep time was 358 minutes.  Stage REM latency was 106.0 minutes.  The patient spent 6.4%% of the night in stage N1 sleep, 69.4%% in stage N2 sleep, 0.0%% in stage N3 and 24.2% in REM.  Alpha intrusion was absent.  Supine sleep was 72.95%.  RESPIRATORY PARAMETERS The overall apnea/hypopnea index (AHI) was 8.5 per hour. There were 0 total apneas, including 0 obstructive, 0 central and 0 mixed apneas. There were 51 hypopneas and 29 RERAs.  The AHI during Stage REM sleep was 33.3 per hour.  AHI while supine was 10.6 per hour.The mean oxygen saturation was 92.3%. The minimum SpO2 during sleep was 74.0%.  snoring was noted during this study.  CARDIAC DATA The 2 lead EKG demonstrated sinus rhythm. The mean heart rate was 95.5 beats per minute. Other EKG findings include: PVCs.  LEG  MOVEMENT DATA The total PLMS were 0 with a resulting PLMS index of 0.0. Associated arousal with leg movement index was 0.0 .  IMPRESSIONS - Mild obstructive sleep apnea occurred during this study (AHI = 8.5/h). - Insufficient early events to meet protocol requirements for split CPAP titration. - Moderate oxygen desaturation was noted during this study (Min O2 = 74.0%). Mean 92.3%. Time with O2 saturation 88% or less was 38 minutes. - No snoring was audible during this study. - EKG findings include PVCs. - Clinically significant periodic limb movements did not occur during sleep. No significant associated arousals.  DIAGNOSIS - Obstructive Sleep Apnea (G47.33) - Nocturnal Hypoxemia (G47.36)  RECOMMENDATIONS - Treatment for mild OSA might include conservative options including observation, weight loss or sleep position off back. Considering the oxygen desaturation, it might be appropriate to return for in-center CPAP titration, which would document if supplemental O2 would be indicated. - Be careful with alcohol, sedatives and other CNS depressants that may worsen sleep apnea and disrupt normal sleep architecture. - Sleep hygiene should be reviewed to assess factors that may improve sleep quality. - Weight management and regular exercise should be initiated or continued if appropriate.  [Electronically signed] 04/02/2022 01:03 PM  Baird Lyons MD, [place], American Board of Sleep Medicine NPI: [id number]                        [place], [place] of Sleep Medicine  ELECTRONICALLY SIGNED ON:  04/02/2022, 12:54 PM [place] PH: [phone]   FX: [phone] [place]

## 2022-04-05 DIAGNOSIS — E559 Vitamin D deficiency, unspecified: Principal | ICD-10-CM

## 2022-04-05 DIAGNOSIS — Z79899 Other long term (current) drug therapy: Principal | ICD-10-CM

## 2022-04-05 DIAGNOSIS — Z941 Heart transplant status: Principal | ICD-10-CM

## 2022-04-05 DIAGNOSIS — E785 Hyperlipidemia, unspecified: Principal | ICD-10-CM

## 2022-04-07 DIAGNOSIS — G4736 Sleep related hypoventilation in conditions classified elsewhere: Principal | ICD-10-CM

## 2022-04-07 DIAGNOSIS — G4733 Obstructive sleep apnea (adult) (pediatric): Principal | ICD-10-CM

## 2022-04-12 ENCOUNTER — Encounter (HOSPITAL_BASED_OUTPATIENT_CLINIC_OR_DEPARTMENT_OTHER): Payer: Self-pay

## 2022-04-12 DIAGNOSIS — G4734 Idiopathic sleep related nonobstructive alveolar hypoventilation: Secondary | ICD-10-CM

## 2022-04-12 DIAGNOSIS — G4733 Obstructive sleep apnea (adult) (pediatric): Secondary | ICD-10-CM

## 2022-04-14 ENCOUNTER — Ambulatory Visit: Admit: 2022-04-14 | Discharge: 2022-04-15 | Payer: PRIVATE HEALTH INSURANCE

## 2022-04-14 DIAGNOSIS — Z941 Heart transplant status: Principal | ICD-10-CM

## 2022-04-14 DIAGNOSIS — D849 Immunodeficiency, unspecified: Principal | ICD-10-CM

## 2022-04-14 DIAGNOSIS — G4734 Idiopathic sleep related nonobstructive alveolar hypoventilation: Principal | ICD-10-CM

## 2022-04-14 DIAGNOSIS — I10 Essential (primary) hypertension: Principal | ICD-10-CM

## 2022-05-07 ENCOUNTER — Encounter: Admit: 2022-05-07 | Payer: PRIVATE HEALTH INSURANCE

## 2022-05-07 ENCOUNTER — Ambulatory Visit: Admit: 2022-05-07 | Discharge: 2022-05-10 | Payer: PRIVATE HEALTH INSURANCE

## 2022-05-07 ENCOUNTER — Ambulatory Visit: Admit: 2022-05-07 | Payer: PRIVATE HEALTH INSURANCE

## 2022-05-09 MED ORDER — LINEZOLID 600 MG TABLET
ORAL_TABLET | Freq: Two times a day (BID) | ORAL | 0 refills | 5 days | Status: CN
Start: 2022-05-09 — End: ?

## 2022-05-11 MED ORDER — VANCOMYCIN 1 GRAM/250 ML IN 0.9 % SODIUM CHLORIDE INTRAVENOUS
INTRAVENOUS | 0 refills | 5 days | Status: CP
Start: 2022-05-11 — End: ?

## 2022-05-16 DIAGNOSIS — Z992 Dependence on renal dialysis: Principal | ICD-10-CM

## 2022-05-16 DIAGNOSIS — Z941 Heart transplant status: Principal | ICD-10-CM

## 2022-05-17 ENCOUNTER — Other Ambulatory Visit: Admit: 2022-05-17 | Discharge: 2022-05-17 | Payer: PRIVATE HEALTH INSURANCE

## 2022-05-17 ENCOUNTER — Ambulatory Visit
Admit: 2022-05-17 | Discharge: 2022-05-17 | Payer: PRIVATE HEALTH INSURANCE | Attending: Vascular Surgery | Primary: Vascular Surgery

## 2022-05-19 DIAGNOSIS — Z941 Heart transplant status: Principal | ICD-10-CM

## 2022-05-27 MED ORDER — ATORVASTATIN 20 MG TABLET
ORAL_TABLET | Freq: Every day | ORAL | 3 refills | 0 days
Start: 2022-05-27 — End: ?

## 2022-05-30 MED ORDER — ATORVASTATIN 20 MG TABLET
ORAL_TABLET | Freq: Every day | ORAL | 3 refills | 90 days | Status: CP
Start: 2022-05-30 — End: ?

## 2022-06-24 ENCOUNTER — Encounter
Admit: 2022-06-24 | Discharge: 2022-06-30 | Disposition: A | Discharge status: Home / Self Care | Payer: MEDICARE | Attending: Certified Registered"

## 2022-06-24 ENCOUNTER — Ambulatory Visit: Admit: 2022-06-24 | Discharge: 2022-06-30 | Disposition: A | Discharge status: Home / Self Care | Payer: MEDICARE

## 2022-06-24 ENCOUNTER — Ambulatory Visit: Admit: 2022-06-24 | Discharge: 2022-06-30 | Payer: MEDICARE

## 2022-06-28 ENCOUNTER — Ambulatory Visit: Admit: 2022-06-28 | Payer: MEDICARE | Attending: Vascular Surgery | Primary: Vascular Surgery

## 2022-06-30 MED ORDER — APIXABAN 2.5 MG TABLET
ORAL_TABLET | Freq: Two times a day (BID) | ORAL | 0 refills | 60 days | Status: CP
Start: 2022-06-30 — End: 2022-08-29

## 2022-06-30 MED ORDER — ELIQUIS 2.5 MG TABLET
ORAL_TABLET | 0 refills | 0 days
Start: 2022-06-30 — End: 2022-06-30

## 2022-06-30 MED ORDER — CLOPIDOGREL 75 MG TABLET
ORAL_TABLET | Freq: Every day | ORAL | 0 refills | 30 days | Status: CP
Start: 2022-06-30 — End: 2022-07-30

## 2022-07-14 ENCOUNTER — Ambulatory Visit: Admit: 2022-07-14 | Discharge: 2022-07-15 | Payer: MEDICARE

## 2022-07-14 ENCOUNTER — Ambulatory Visit: Admit: 2022-07-14 | Discharge: 2022-07-15 | Payer: MEDICARE | Attending: Specialist | Primary: Specialist

## 2022-07-14 ENCOUNTER — Ambulatory Visit: Admit: 2022-07-14 | Discharge: 2022-07-15 | Payer: MEDICARE | Attending: Nephrology | Primary: Nephrology

## 2022-07-14 DIAGNOSIS — D849 Immunodeficiency, unspecified: Principal | ICD-10-CM

## 2022-07-14 DIAGNOSIS — E782 Mixed hyperlipidemia: Principal | ICD-10-CM

## 2022-07-14 DIAGNOSIS — G4734 Idiopathic sleep related nonobstructive alveolar hypoventilation: Principal | ICD-10-CM

## 2022-07-14 DIAGNOSIS — L603 Nail dystrophy: Principal | ICD-10-CM

## 2022-07-14 DIAGNOSIS — Z941 Heart transplant status: Principal | ICD-10-CM

## 2022-07-14 DIAGNOSIS — I10 Essential (primary) hypertension: Principal | ICD-10-CM

## 2022-07-14 DIAGNOSIS — N186 End stage renal disease: Principal | ICD-10-CM

## 2022-07-14 DIAGNOSIS — N185 Chronic kidney disease, stage 5: Principal | ICD-10-CM

## 2022-07-20 DIAGNOSIS — T86298 Other complications of heart transplant: Principal | ICD-10-CM

## 2022-07-20 MED ORDER — MYCOPHENOLATE SODIUM 180 MG TABLET,DELAYED RELEASE
ORAL_TABLET | Freq: Two times a day (BID) | ORAL | 3 refills | 90 days | Status: CP
Start: 2022-07-20 — End: ?

## 2022-07-26 ENCOUNTER — Ambulatory Visit: Admit: 2022-07-26 | Discharge: 2022-07-27 | Payer: MEDICARE | Attending: Vascular Surgery | Primary: Vascular Surgery

## 2022-07-26 ENCOUNTER — Ambulatory Visit: Admit: 2022-07-26 | Discharge: 2022-07-27 | Payer: MEDICARE

## 2022-07-26 ENCOUNTER — Ambulatory Visit
Admit: 2022-07-26 | Discharge: 2022-07-27 | Payer: MEDICARE | Attending: Cardiovascular Disease | Primary: Cardiovascular Disease

## 2022-07-26 DIAGNOSIS — Z7952 Long term (current) use of systemic steroids: Principal | ICD-10-CM

## 2022-07-26 DIAGNOSIS — K21 Gastroesophageal reflux disease with esophagitis without hemorrhage: Principal | ICD-10-CM

## 2022-07-26 DIAGNOSIS — Z941 Heart transplant status: Principal | ICD-10-CM

## 2022-07-26 DIAGNOSIS — T86298 Other complications of heart transplant: Principal | ICD-10-CM

## 2022-07-26 MED ORDER — ATORVASTATIN 20 MG TABLET
ORAL_TABLET | Freq: Every day | ORAL | 3 refills | 90 days | Status: CP
Start: 2022-07-26 — End: ?

## 2022-07-26 MED ORDER — AMLODIPINE 5 MG TABLET
ORAL_TABLET | Freq: Every day | ORAL | 3 refills | 90 days | Status: CP
Start: 2022-07-26 — End: 2023-07-26

## 2022-07-26 MED ORDER — TACROLIMUS 5 MG CAPSULE, IMMEDIATE-RELEASE
ORAL_CAPSULE | 3 refills | 0 days | Status: CP
Start: 2022-07-26 — End: ?

## 2022-07-26 MED ORDER — CLOPIDOGREL 75 MG TABLET
ORAL_TABLET | Freq: Every day | ORAL | 3 refills | 90 days | Status: CP
Start: 2022-07-26 — End: 2022-08-25

## 2022-07-26 MED ORDER — MIRTAZAPINE 30 MG TABLET
ORAL_TABLET | Freq: Every evening | ORAL | 3 refills | 90 days | Status: CP
Start: 2022-07-26 — End: ?

## 2022-07-26 MED ORDER — APIXABAN 2.5 MG TABLET
ORAL_TABLET | Freq: Two times a day (BID) | ORAL | 3 refills | 90 days | Status: CP
Start: 2022-07-26 — End: 2022-09-24

## 2022-07-26 MED ORDER — PANTOPRAZOLE 40 MG TABLET,DELAYED RELEASE
ORAL_TABLET | Freq: Every day | ORAL | 3 refills | 90 days | Status: CP
Start: 2022-07-26 — End: ?

## 2022-07-26 MED ORDER — MYCOPHENOLATE SODIUM 180 MG TABLET,DELAYED RELEASE
ORAL_TABLET | Freq: Two times a day (BID) | ORAL | 3 refills | 90 days | Status: CP
Start: 2022-07-26 — End: ?

## 2022-07-26 MED ORDER — TACROLIMUS 1 MG CAPSULE, IMMEDIATE-RELEASE
ORAL_CAPSULE | 3 refills | 0 days | Status: CP
Start: 2022-07-26 — End: ?

## 2022-07-28 MED ORDER — APIXABAN 2.5 MG TABLET
ORAL_TABLET | Freq: Two times a day (BID) | ORAL | 3 refills | 90 days | Status: CP
Start: 2022-07-28 — End: ?

## 2022-07-29 MED ORDER — APIXABAN 2.5 MG TABLET
ORAL_TABLET | Freq: Two times a day (BID) | ORAL | 11 refills | 30 days | Status: CP
Start: 2022-07-29 — End: ?

## 2022-08-01 DIAGNOSIS — Z941 Heart transplant status: Principal | ICD-10-CM

## 2022-08-01 DIAGNOSIS — Z741 Need for assistance with personal care: Principal | ICD-10-CM

## 2022-08-04 ENCOUNTER — Ambulatory Visit: Admit: 2022-08-04 | Discharge: 2022-08-05 | Payer: MEDICARE

## 2022-08-05 DIAGNOSIS — M810 Age-related osteoporosis without current pathological fracture: Principal | ICD-10-CM

## 2022-08-26 ENCOUNTER — Ambulatory Visit: Admit: 2022-08-26 | Discharge: 2022-08-26 | Payer: MEDICARE

## 2022-08-26 DIAGNOSIS — M810 Age-related osteoporosis without current pathological fracture: Principal | ICD-10-CM

## 2022-09-06 MED ORDER — MIDODRINE 5 MG TABLET
ORAL_TABLET | 3 refills | 0 days
Start: 2022-09-06 — End: ?

## 2022-09-07 MED ORDER — MIDODRINE 5 MG TABLET
ORAL_TABLET | 3 refills | 0 days
Start: 2022-09-07 — End: ?

## 2022-09-08 ENCOUNTER — Other Ambulatory Visit (HOSPITAL_COMMUNITY)
Admission: RE | Admit: 2022-09-08 | Discharge: 2022-09-08 | Disposition: A | Discharge status: Home / Self Care | Payer: Medicare Other | Source: Other Acute Inpatient Hospital | Attending: Cardiovascular Disease | Admitting: Cardiovascular Disease

## 2022-09-08 DIAGNOSIS — E785 Hyperlipidemia, unspecified: Secondary | ICD-10-CM | POA: Diagnosis not present

## 2022-09-08 DIAGNOSIS — Z48298 Encounter for aftercare following other organ transplant: Secondary | ICD-10-CM | POA: Diagnosis not present

## 2022-09-08 DIAGNOSIS — Z125 Encounter for screening for malignant neoplasm of prostate: Secondary | ICD-10-CM | POA: Diagnosis not present

## 2022-09-08 DIAGNOSIS — E559 Vitamin D deficiency, unspecified: Secondary | ICD-10-CM | POA: Diagnosis not present

## 2022-09-08 DIAGNOSIS — D709 Neutropenia, unspecified: Secondary | ICD-10-CM | POA: Diagnosis not present

## 2022-09-08 DIAGNOSIS — B259 Cytomegaloviral disease, unspecified: Secondary | ICD-10-CM | POA: Diagnosis not present

## 2022-09-08 DIAGNOSIS — R7989 Other specified abnormal findings of blood chemistry: Secondary | ICD-10-CM | POA: Insufficient documentation

## 2022-09-08 DIAGNOSIS — Z79899 Other long term (current) drug therapy: Secondary | ICD-10-CM | POA: Diagnosis not present

## 2022-09-08 DIAGNOSIS — E119 Type 2 diabetes mellitus without complications: Secondary | ICD-10-CM | POA: Insufficient documentation

## 2022-09-08 DIAGNOSIS — Z941 Heart transplant status: Secondary | ICD-10-CM | POA: Diagnosis present

## 2022-09-08 LAB — PSA: Prostatic Specific Antigen: 2.22 ng/mL (ref 0.00–4.00)

## 2022-09-08 LAB — CBC WITH DIFFERENTIAL/PLATELET
Abs Immature Granulocytes: 0.1 10*3/uL — ABNORMAL HIGH (ref 0.00–0.07)
Basophils Absolute: 0 10*3/uL (ref 0.0–0.1)
Basophils Relative: 1 %
Eosinophils Absolute: 0.4 10*3/uL (ref 0.0–0.5)
Eosinophils Relative: 6 %
HCT: 36.9 % — ABNORMAL LOW (ref 39.0–52.0)
Hemoglobin: 11.3 g/dL — ABNORMAL LOW (ref 13.0–17.0)
Immature Granulocytes: 2 %
Lymphocytes Relative: 11 %
Lymphs Abs: 0.7 10*3/uL (ref 0.7–4.0)
MCH: 29.4 pg (ref 26.0–34.0)
MCHC: 30.6 g/dL (ref 30.0–36.0)
MCV: 96.1 fL (ref 80.0–100.0)
Monocytes Absolute: 0.6 10*3/uL (ref 0.1–1.0)
Monocytes Relative: 9 %
Neutro Abs: 4.6 10*3/uL (ref 1.7–7.7)
Neutrophils Relative %: 71 %
Platelets: 129 10*3/uL — ABNORMAL LOW (ref 150–400)
RBC: 3.84 MIL/uL — ABNORMAL LOW (ref 4.22–5.81)
RDW: 13.7 % (ref 11.5–15.5)
WBC: 6.3 10*3/uL (ref 4.0–10.5)
nRBC: 0 % (ref 0.0–0.2)

## 2022-09-08 LAB — COMPREHENSIVE METABOLIC PANEL
ALT: 12 U/L (ref 0–44)
AST: 17 U/L (ref 15–41)
Albumin: 4.1 g/dL (ref 3.5–5.0)
Alkaline Phosphatase: 85 U/L (ref 38–126)
Anion gap: 15 (ref 5–15)
BUN: 45 mg/dL — ABNORMAL HIGH (ref 8–23)
CO2: 25 mmol/L (ref 22–32)
Calcium: 9.3 mg/dL (ref 8.9–10.3)
Chloride: 98 mmol/L (ref 98–111)
Creatinine, Ser: 6.14 mg/dL — ABNORMAL HIGH (ref 0.61–1.24)
GFR, Estimated: 9 mL/min — ABNORMAL LOW (ref >60)
Glucose, Bld: 121 mg/dL — ABNORMAL HIGH (ref 70–99)
Potassium: 4.8 mmol/L (ref 3.5–5.1)
Sodium: 138 mmol/L (ref 135–145)
Total Bilirubin: 0.5 mg/dL (ref 0.3–1.2)
Total Protein: 7.3 g/dL (ref 6.5–8.1)

## 2022-09-08 LAB — TSH: TSH: 2.469 u[IU]/mL (ref 0.350–4.500)

## 2022-09-08 LAB — LIPID PANEL
Cholesterol: 136 mg/dL (ref 0–200)
HDL: 39 mg/dL — ABNORMAL LOW (ref >40)
LDL Cholesterol: 80 mg/dL (ref 0–99)
Total CHOL/HDL Ratio: 3.5 RATIO
Triglycerides: 86 mg/dL (ref <150)
VLDL: 17 mg/dL (ref 0–40)

## 2022-09-08 LAB — MAGNESIUM: Magnesium: 1.9 mg/dL (ref 1.7–2.4)

## 2022-09-08 MED ORDER — AMLODIPINE 10 MG TABLET
ORAL_TABLET | Freq: Every day | ORAL | 3 refills | 90 days | Status: CP
Start: 2022-09-08 — End: 2023-09-08

## 2022-09-10 LAB — TACROLIMUS LEVEL: Tacrolimus (FK506) - LabCorp: 6.6 ng/mL (ref 2.0–20.0)

## 2022-09-29 DIAGNOSIS — Z79899 Other long term (current) drug therapy: Principal | ICD-10-CM

## 2022-09-29 DIAGNOSIS — Z941 Heart transplant status: Principal | ICD-10-CM

## 2022-10-03 DIAGNOSIS — Z941 Heart transplant status: Principal | ICD-10-CM

## 2022-10-03 DIAGNOSIS — Z79899 Other long term (current) drug therapy: Principal | ICD-10-CM

## 2022-10-04 ENCOUNTER — Ambulatory Visit: Admit: 2022-10-04 | Discharge: 2022-10-05 | Payer: MEDICARE

## 2022-10-04 DIAGNOSIS — I739 Peripheral vascular disease, unspecified: Secondary | ICD-10-CM | POA: Insufficient documentation

## 2022-10-04 DIAGNOSIS — G4734 Idiopathic sleep related nonobstructive alveolar hypoventilation: Principal | ICD-10-CM

## 2022-10-10 DIAGNOSIS — Z941 Heart transplant status: Principal | ICD-10-CM

## 2022-10-10 DIAGNOSIS — Z79899 Other long term (current) drug therapy: Principal | ICD-10-CM

## 2022-10-11 DIAGNOSIS — Z01818 Encounter for other preprocedural examination: Principal | ICD-10-CM

## 2022-10-11 DIAGNOSIS — N186 End stage renal disease: Principal | ICD-10-CM

## 2022-10-17 DIAGNOSIS — Z79899 Other long term (current) drug therapy: Principal | ICD-10-CM

## 2022-10-17 DIAGNOSIS — Z941 Heart transplant status: Principal | ICD-10-CM

## 2022-10-24 DIAGNOSIS — Z79899 Other long term (current) drug therapy: Principal | ICD-10-CM

## 2022-10-24 DIAGNOSIS — Z941 Heart transplant status: Principal | ICD-10-CM

## 2022-10-31 DIAGNOSIS — Z79899 Other long term (current) drug therapy: Principal | ICD-10-CM

## 2022-10-31 DIAGNOSIS — Z941 Heart transplant status: Principal | ICD-10-CM

## 2022-11-03 DIAGNOSIS — J9612 Chronic respiratory failure with hypercapnia: Secondary | ICD-10-CM

## 2022-11-07 DIAGNOSIS — Z941 Heart transplant status: Principal | ICD-10-CM

## 2022-11-07 DIAGNOSIS — Z79899 Other long term (current) drug therapy: Principal | ICD-10-CM

## 2022-11-14 DIAGNOSIS — Z79899 Other long term (current) drug therapy: Principal | ICD-10-CM

## 2022-11-14 DIAGNOSIS — Z941 Heart transplant status: Principal | ICD-10-CM

## 2022-11-21 DIAGNOSIS — Z941 Heart transplant status: Principal | ICD-10-CM

## 2022-11-21 DIAGNOSIS — Z79899 Other long term (current) drug therapy: Principal | ICD-10-CM

## 2022-11-22 ENCOUNTER — Institutional Professional Consult (permissible substitution): Admit: 2022-11-22 | Discharge: 2022-11-23 | Payer: MEDICARE

## 2022-11-28 DIAGNOSIS — Z941 Heart transplant status: Principal | ICD-10-CM

## 2022-11-28 DIAGNOSIS — Z79899 Other long term (current) drug therapy: Principal | ICD-10-CM

## 2022-12-05 DIAGNOSIS — Z941 Heart transplant status: Principal | ICD-10-CM

## 2022-12-05 DIAGNOSIS — Z79899 Other long term (current) drug therapy: Principal | ICD-10-CM

## 2022-12-12 DIAGNOSIS — Z941 Heart transplant status: Principal | ICD-10-CM

## 2022-12-12 DIAGNOSIS — Z79899 Other long term (current) drug therapy: Principal | ICD-10-CM

## 2022-12-12 MED ORDER — TACROLIMUS 5 MG CAPSULE, IMMEDIATE-RELEASE
ORAL_CAPSULE | 3 refills | 0 days | Status: CP
Start: 2022-12-12 — End: ?

## 2022-12-12 MED ORDER — TACROLIMUS 1 MG CAPSULE, IMMEDIATE-RELEASE
ORAL_CAPSULE | 3 refills | 0 days | Status: CP
Start: 2022-12-12 — End: ?

## 2022-12-14 DIAGNOSIS — T86298 Other complications of heart transplant: Principal | ICD-10-CM

## 2022-12-14 MED ORDER — MYCOPHENOLATE SODIUM 180 MG TABLET,DELAYED RELEASE
ORAL_TABLET | Freq: Two times a day (BID) | ORAL | 3 refills | 90.00000 days | Status: CP
Start: 2022-12-14 — End: 2022-12-14

## 2022-12-16 MED ORDER — ENTRESTO 97 MG-103 MG TABLET
ORAL_TABLET | Freq: Two times a day (BID) | ORAL | 4 refills | 90 days
Start: 2022-12-16 — End: 2023-01-15

## 2022-12-19 DIAGNOSIS — Z79899 Other long term (current) drug therapy: Principal | ICD-10-CM

## 2022-12-19 DIAGNOSIS — Z941 Heart transplant status: Principal | ICD-10-CM

## 2022-12-20 DIAGNOSIS — T86298 Other complications of heart transplant: Principal | ICD-10-CM

## 2022-12-23 IMAGING — DX DG CHEST 2V
2 series · 2 of 2 positions shown · non-contrast
Comparison: Chest radiograph dated 06/19/2021. Chest CT dated
06/19/2021.

CLINICAL DATA: Pneumonia and fever.

EXAM:
CHEST - 2 VIEW

[x chest ap]
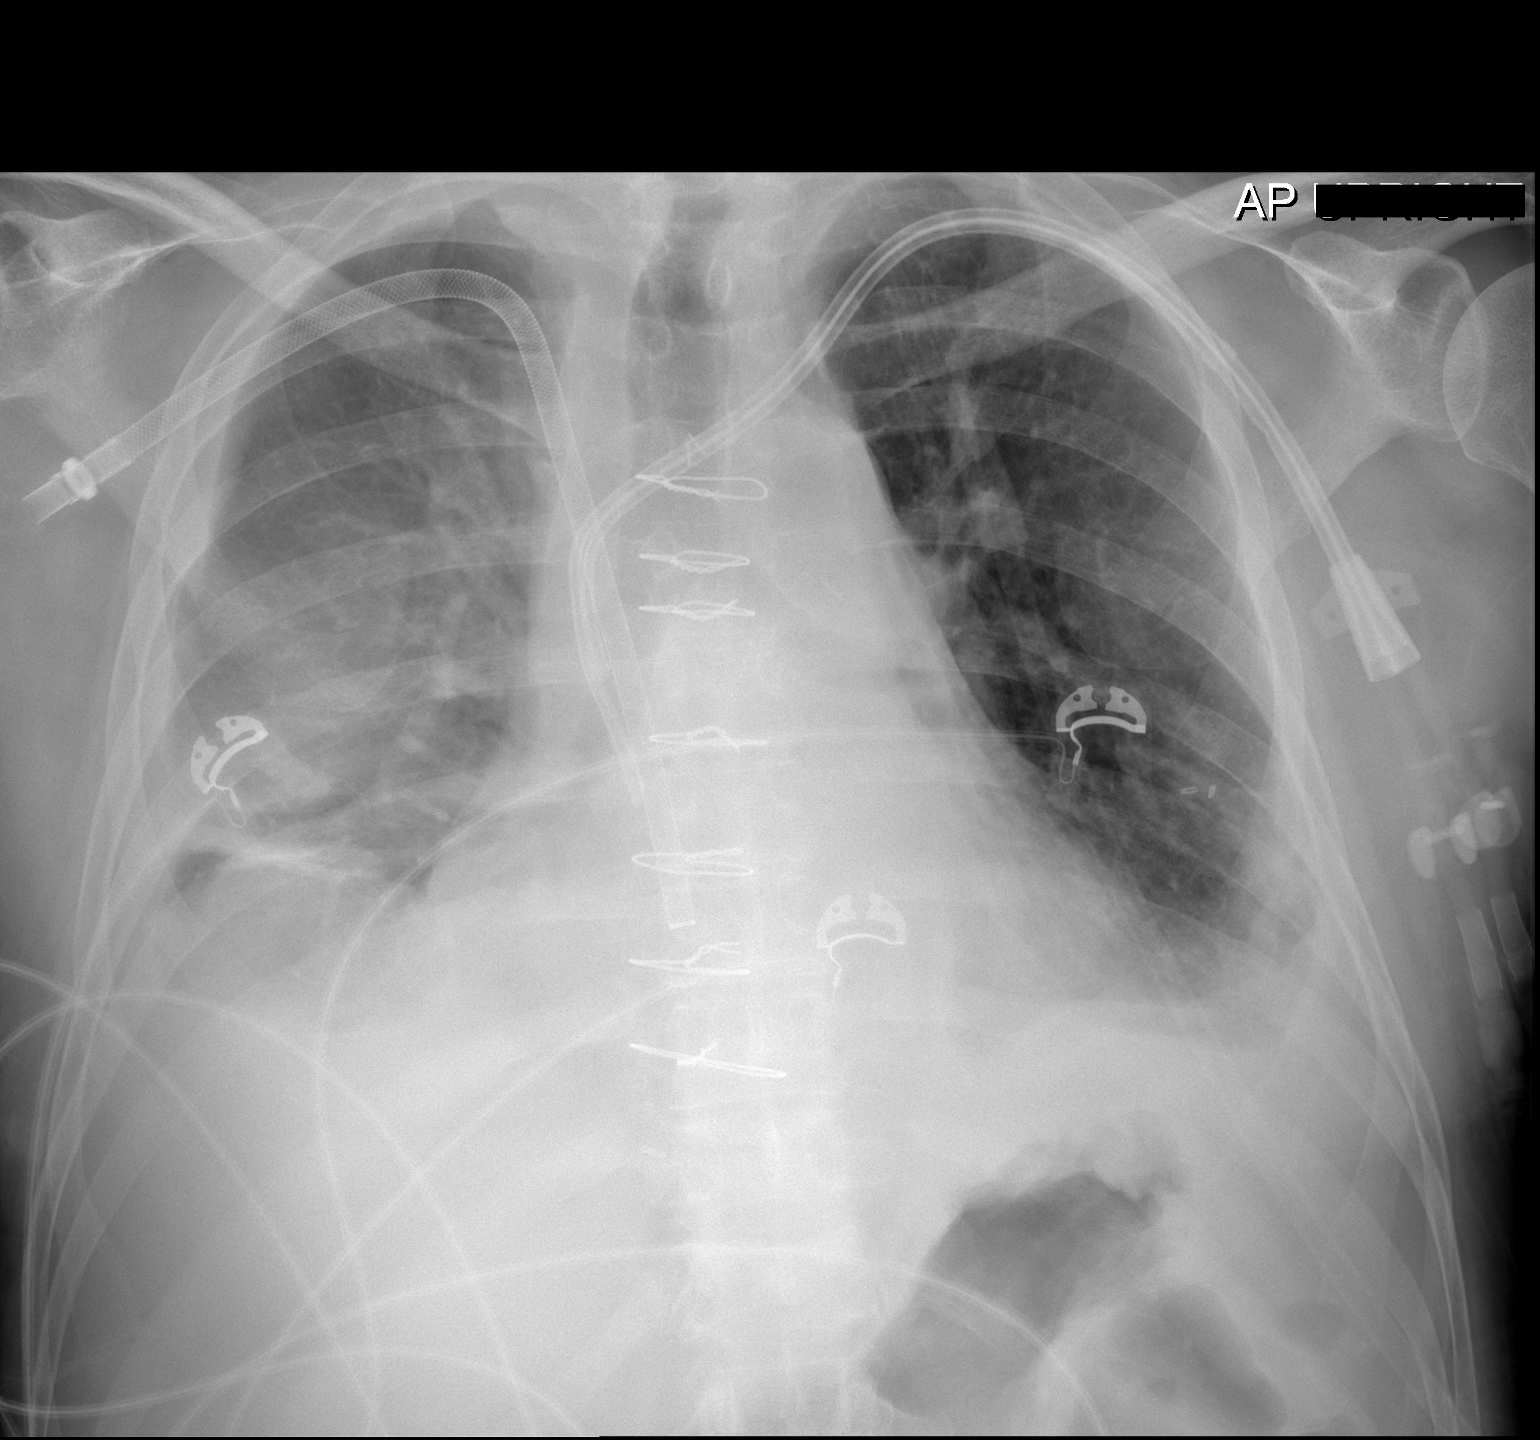

[w chest lat]
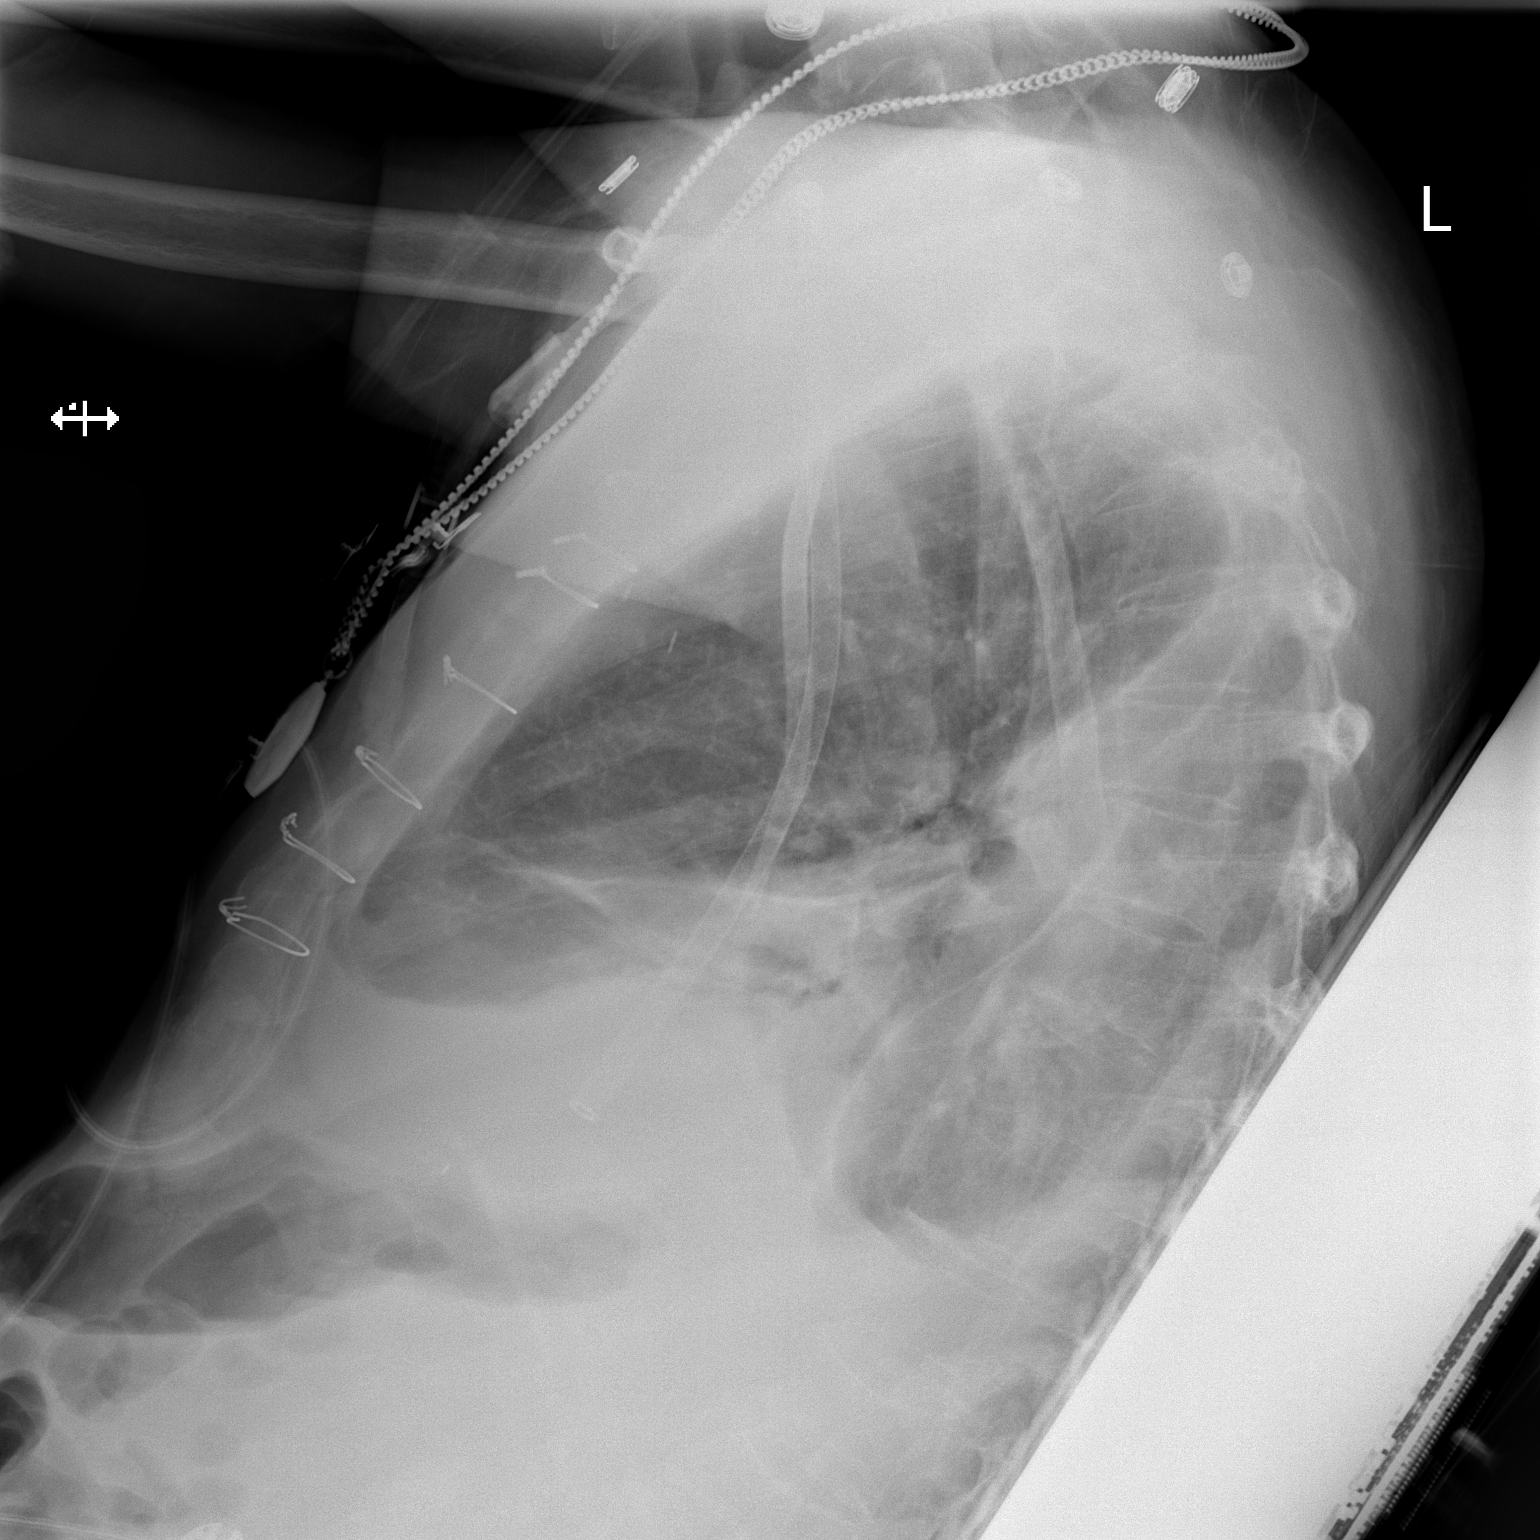

[2 of 2 positions shown; findings below may reference images not displayed]

FINDINGS: Left-sided dialysis catheter as well as a tunnel catheter on the
right side in similar position.

Interval increase in the size of the right pleural effusion with
associated increase in right lung opacity compared to prior
radiograph. Small left pleural effusion and left lung base
atelectasis or infiltrate. No pneumothorax. Stable cardiomediastinal
silhouette median sternotomy wires. No acute osseous pathology.
IMPRESSION: Interval increase in the size of the right pleural effusion with
associated increase in right lung opacity since the prior
radiograph.

## 2022-12-24 IMAGING — DX DG CHEST 1V PORT
1 series · 1 of 1 positions shown · non-contrast
Comparison: 06/21/2021

CLINICAL DATA: Follow-up right pleural effusion

EXAM:
PORTABLE CHEST 1 VIEW

[chest]
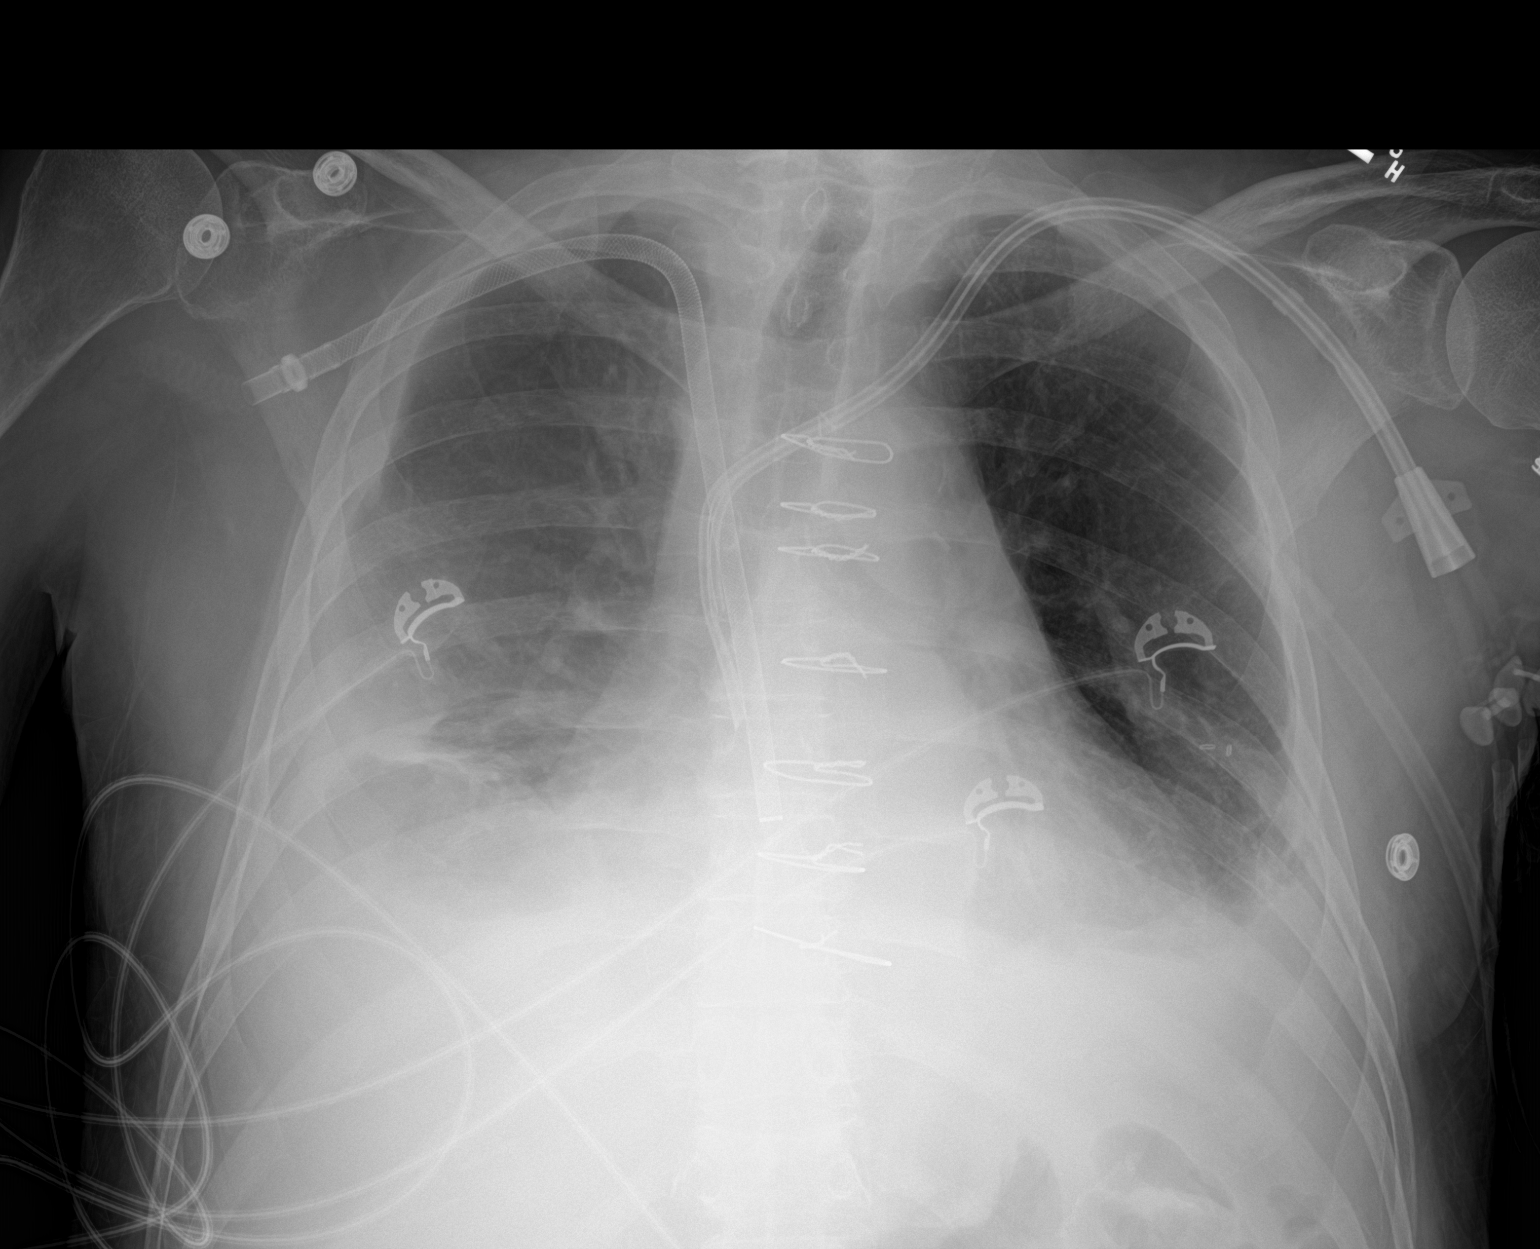

[1 of 1 positions shown; findings below may reference images not displayed]

FINDINGS: Cardiac shadow is stable but enlarged. Postsurgical changes are
noted. Left-sided dialysis catheter and right-sided hero graft are
again noted and stable. Bilateral pleural effusions are again noted
right greater than left with associated atelectatic changes. The
overall appearance is similar to that seen on the previous day. No
acute abnormality noted.
IMPRESSION: No significant change from the previous day. Bilateral effusions and
atelectatic changes are seen.

## 2022-12-24 IMAGING — DX DG CHEST 1V PORT
1 series · 1 of 1 positions shown · non-contrast
Comparison: Chest x-ray 06/22/2021 [DATE] a.m., CT chest 06/19/2021

CLINICAL DATA: Status post thoracentesis

EXAM:
PORTABLE CHEST 1 VIEW

[chest ap]
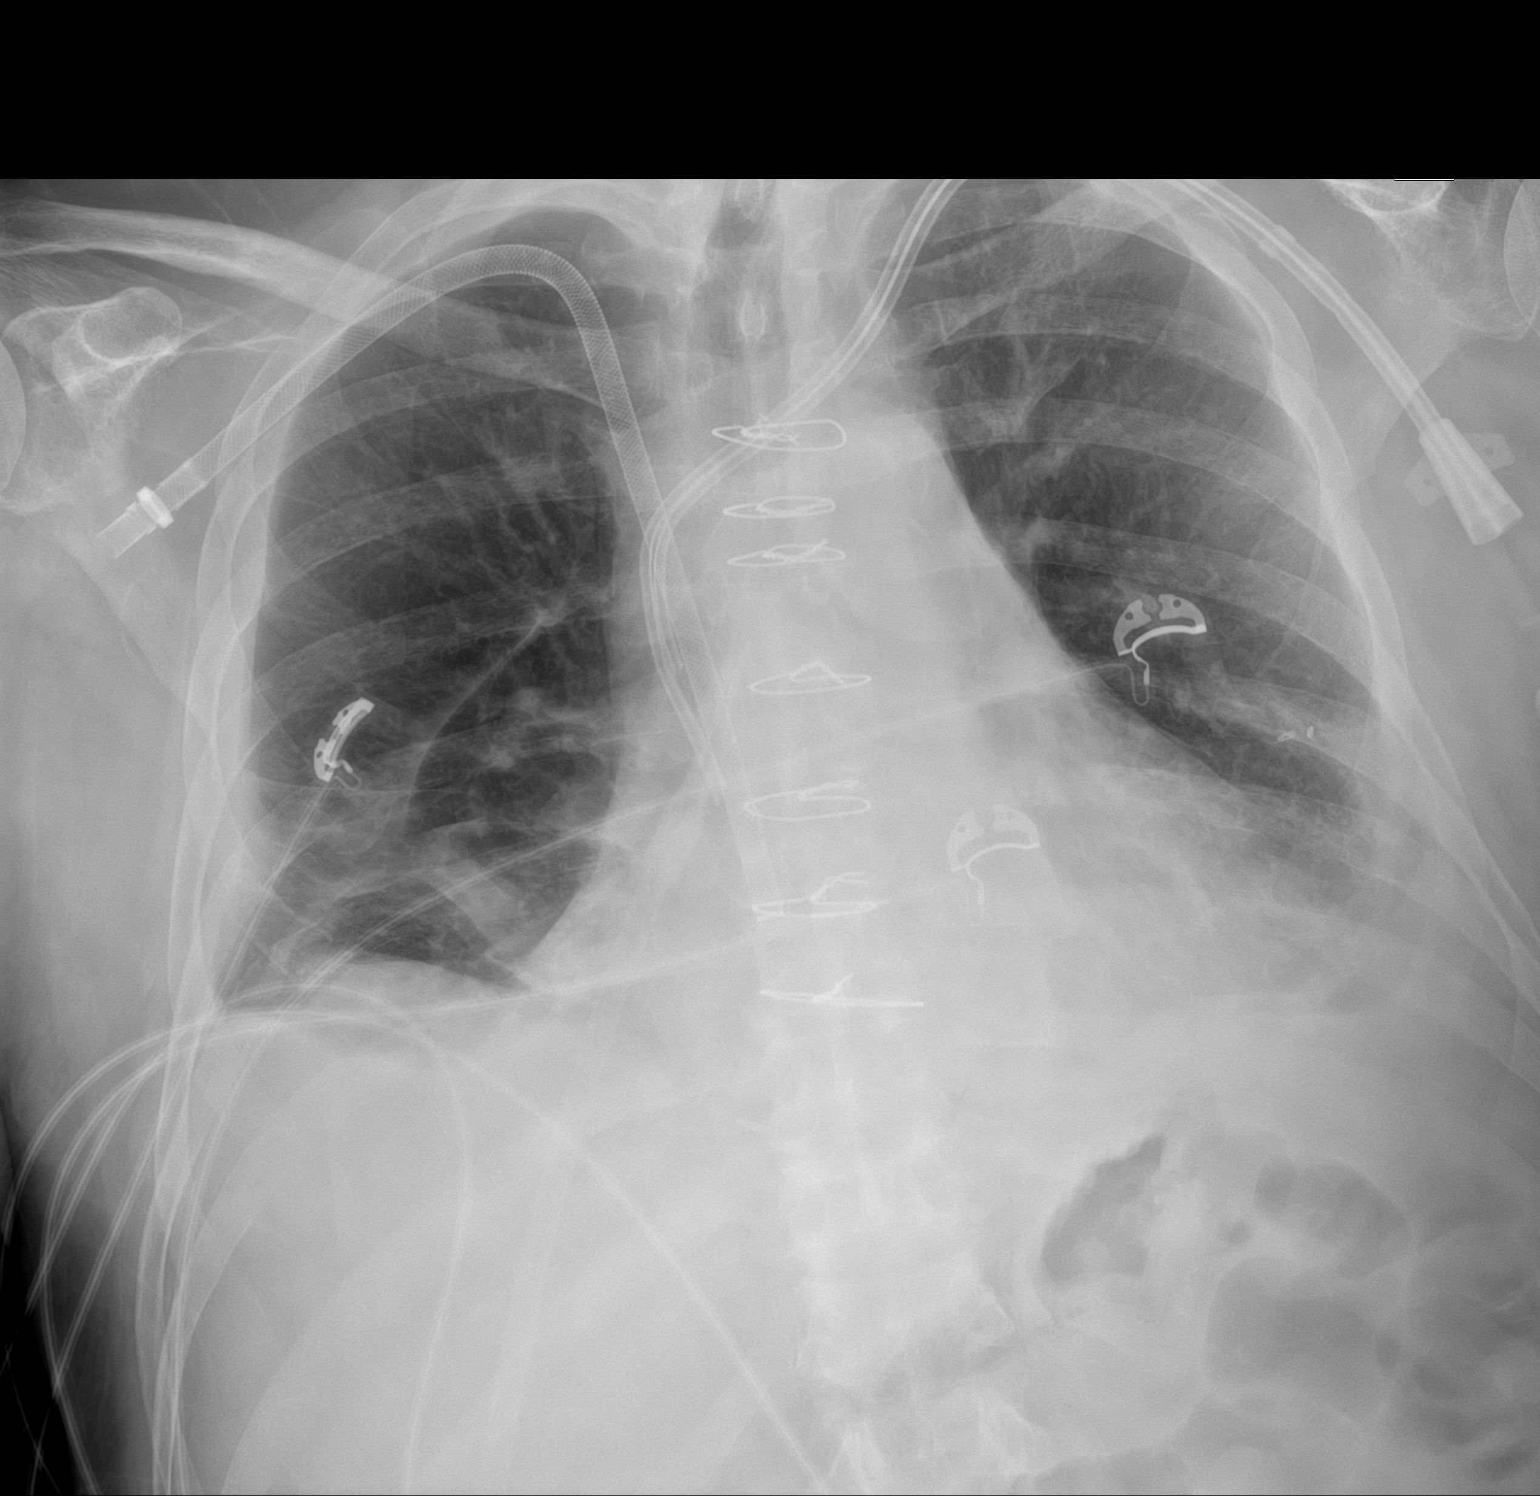

[1 of 1 positions shown; findings below may reference images not displayed]

FINDINGS: Stable left chest wall dialysis catheter with tip in the region of
the cavoatrial junction. Right-sided hera cath tip with tip
terminating overlying the right atrium.

The heart and mediastinal contours are unchanged.

Patchy airspace opacities within the right lower lung and left lower
lobe. Redemonstration of left upper lobe linear atelectasis. No
pulmonary edema. Interval decrease in size of a now trace to small
volume residual right pleural effusion. Persistent trace, possibly
loculated, left pleural effusion. No pneumothorax.

No acute osseous abnormality.
IMPRESSION: 1. Interval decrease in size of a now trace to small volume residual
right pleural effusion. Persistent trace, possibly loculated, left
pleural effusion.
2. Patchy bilateral lower lobe airspace opacities may represent
atelectasis versus infection/inflammation.

## 2022-12-26 DIAGNOSIS — Z941 Heart transplant status: Principal | ICD-10-CM

## 2022-12-26 DIAGNOSIS — Z79899 Other long term (current) drug therapy: Principal | ICD-10-CM

## 2022-12-26 IMAGING — DX DG CHEST 1V PORT
1 series · 1 of 1 positions shown · non-contrast
Comparison: Portable exam 4040 hours compared to 06/22/2021

CLINICAL DATA: Pneumonia, thoracentesis 2 days ago

EXAM:
PORTABLE CHEST 1 VIEW

[chest ap]
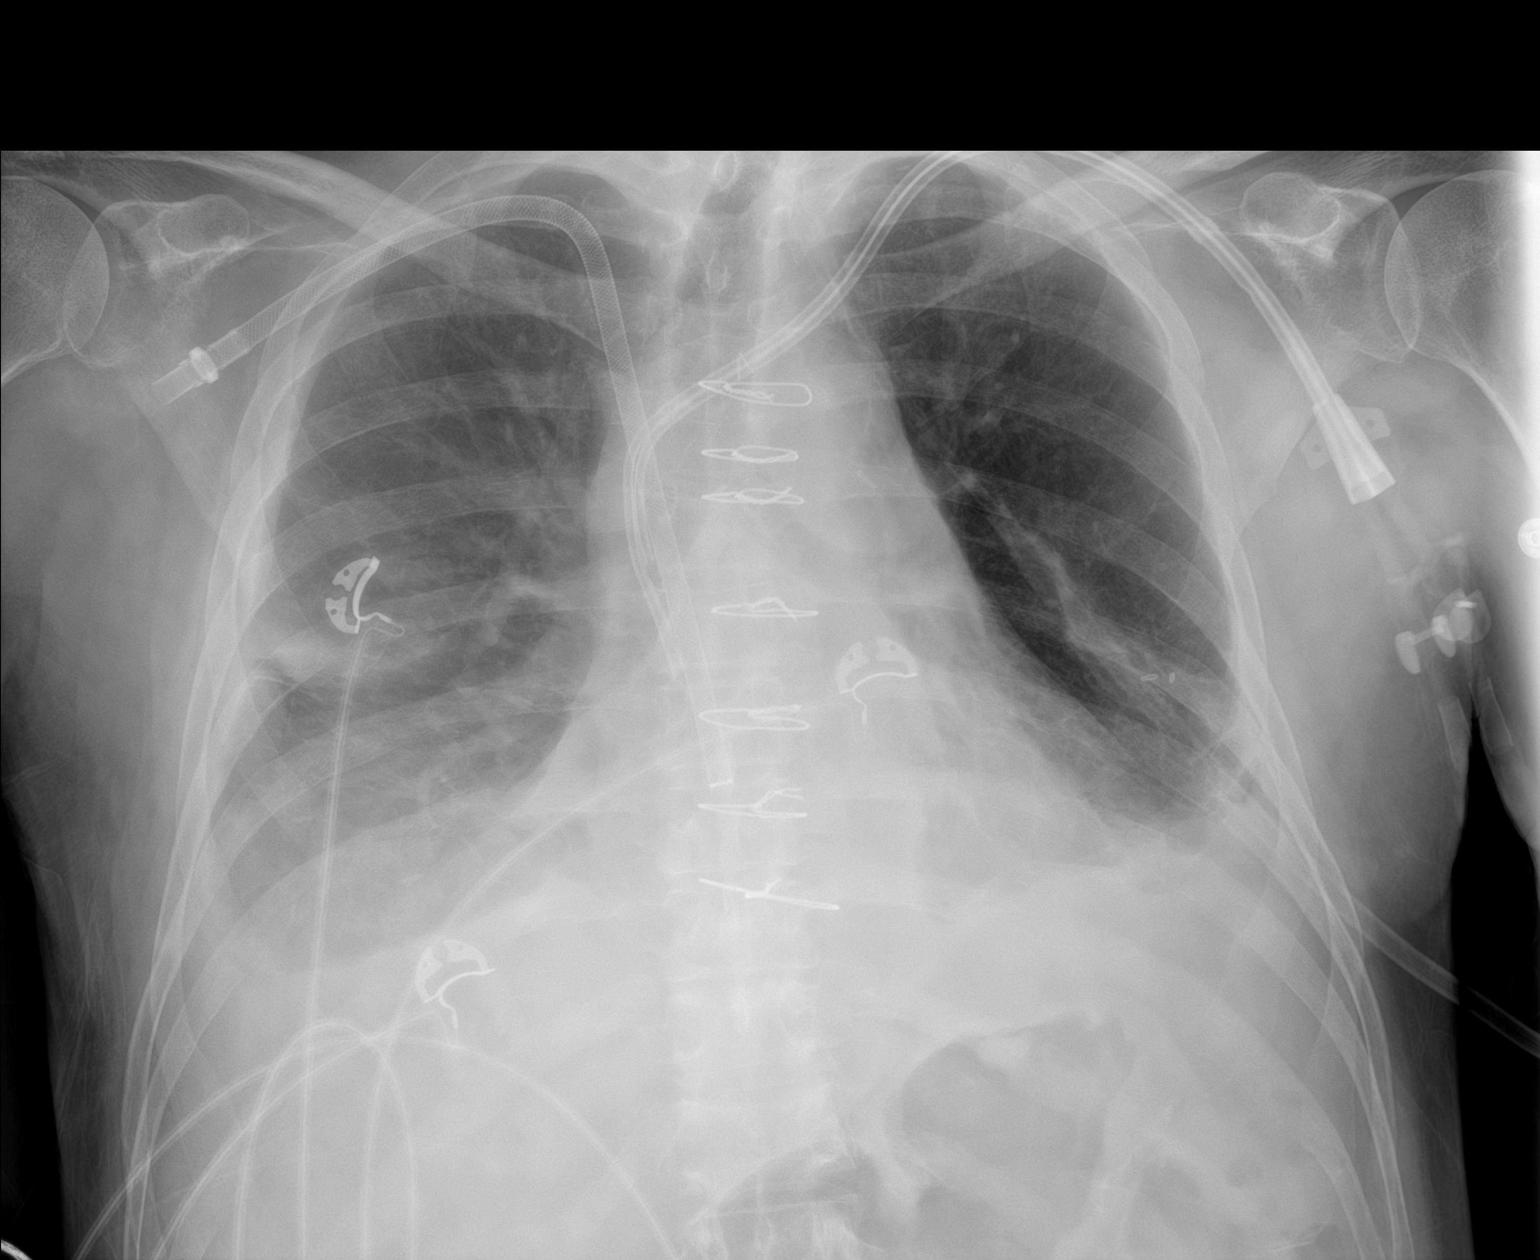

[1 of 1 positions shown; findings below may reference images not displayed]

FINDINGS: BILATERAL jugular lines, RIGHT terminating over RIGHT atrium and
LEFT terminating over SVC near cavoatrial junction.

Normal heart size post median sternotomy.

Mediastinal contours and pulmonary vascularity normal.

Bibasilar pleural effusions and atelectasis persists.

Upper lungs clear.

No pneumothorax or acute osseous findings.
IMPRESSION: Persistent bibasilar effusions and atelectasis, slightly greater on
RIGHT.

## 2023-01-02 DIAGNOSIS — Z79899 Other long term (current) drug therapy: Principal | ICD-10-CM

## 2023-01-02 DIAGNOSIS — Z941 Heart transplant status: Principal | ICD-10-CM

## 2023-01-09 DIAGNOSIS — Z79899 Other long term (current) drug therapy: Principal | ICD-10-CM

## 2023-01-09 DIAGNOSIS — Z941 Heart transplant status: Principal | ICD-10-CM

## 2023-01-16 DIAGNOSIS — Z941 Heart transplant status: Principal | ICD-10-CM

## 2023-01-16 DIAGNOSIS — Z79899 Other long term (current) drug therapy: Principal | ICD-10-CM

## 2023-01-19 ENCOUNTER — Ambulatory Visit: Admit: 2023-01-19 | Discharge: 2023-01-19 | Payer: MEDICARE

## 2023-01-19 ENCOUNTER — Ambulatory Visit: Admit: 2023-01-19 | Discharge: 2023-01-19 | Payer: MEDICARE | Attending: Surgery | Primary: Surgery

## 2023-01-19 ENCOUNTER — Ambulatory Visit: Admit: 2023-01-19 | Discharge: 2023-01-20 | Payer: MEDICARE

## 2023-01-19 DIAGNOSIS — Z01818 Encounter for other preprocedural examination: Principal | ICD-10-CM

## 2023-01-19 DIAGNOSIS — J9690 Respiratory failure, unspecified, unspecified whether with hypoxia or hypercapnia: Principal | ICD-10-CM

## 2023-01-19 DIAGNOSIS — Z941 Heart transplant status: Principal | ICD-10-CM

## 2023-01-23 DIAGNOSIS — E785 Hyperlipidemia, unspecified: Principal | ICD-10-CM

## 2023-01-23 DIAGNOSIS — Z79899 Other long term (current) drug therapy: Principal | ICD-10-CM

## 2023-01-23 DIAGNOSIS — Z941 Heart transplant status: Principal | ICD-10-CM

## 2023-01-23 DIAGNOSIS — E559 Vitamin D deficiency, unspecified: Principal | ICD-10-CM

## 2023-01-30 DIAGNOSIS — Z941 Heart transplant status: Principal | ICD-10-CM

## 2023-01-30 DIAGNOSIS — Z79899 Other long term (current) drug therapy: Principal | ICD-10-CM

## 2023-02-06 DIAGNOSIS — Z79899 Other long term (current) drug therapy: Principal | ICD-10-CM

## 2023-02-06 DIAGNOSIS — Z941 Heart transplant status: Principal | ICD-10-CM

## 2023-02-13 DIAGNOSIS — Z79899 Other long term (current) drug therapy: Principal | ICD-10-CM

## 2023-02-13 DIAGNOSIS — Z941 Heart transplant status: Principal | ICD-10-CM

## 2023-02-20 DIAGNOSIS — Z79899 Other long term (current) drug therapy: Principal | ICD-10-CM

## 2023-02-20 DIAGNOSIS — Z941 Heart transplant status: Principal | ICD-10-CM

## 2023-02-21 DIAGNOSIS — Z01810 Encounter for preprocedural cardiovascular examination: Principal | ICD-10-CM

## 2023-02-21 DIAGNOSIS — Z01818 Encounter for other preprocedural examination: Principal | ICD-10-CM

## 2023-02-21 DIAGNOSIS — N186 End stage renal disease: Principal | ICD-10-CM

## 2023-02-27 DIAGNOSIS — Z79899 Other long term (current) drug therapy: Principal | ICD-10-CM

## 2023-02-27 DIAGNOSIS — Z941 Heart transplant status: Principal | ICD-10-CM

## 2023-03-06 DIAGNOSIS — Z941 Heart transplant status: Principal | ICD-10-CM

## 2023-03-06 DIAGNOSIS — Z79899 Other long term (current) drug therapy: Principal | ICD-10-CM

## 2023-03-07 ENCOUNTER — Emergency Department (HOSPITAL_COMMUNITY): Payer: Medicare Other

## 2023-03-07 ENCOUNTER — Emergency Department (HOSPITAL_COMMUNITY)
Admission: EM | Admit: 2023-03-07 | Discharge: 2023-03-08 | Disposition: A | Discharge status: Other Acute Inpatient Hospital | Payer: Medicare Other | Attending: Emergency Medicine | Admitting: Emergency Medicine

## 2023-03-07 ENCOUNTER — Encounter (HOSPITAL_COMMUNITY): Payer: Self-pay

## 2023-03-07 ENCOUNTER — Institutional Professional Consult (permissible substitution): Payer: Medicare Other | Admitting: Pulmonary Disease

## 2023-03-07 ENCOUNTER — Other Ambulatory Visit: Payer: Self-pay

## 2023-03-07 DIAGNOSIS — Z992 Dependence on renal dialysis: Secondary | ICD-10-CM | POA: Insufficient documentation

## 2023-03-07 DIAGNOSIS — B348 Other viral infections of unspecified site: Secondary | ICD-10-CM | POA: Diagnosis not present

## 2023-03-07 DIAGNOSIS — Z941 Heart transplant status: Secondary | ICD-10-CM

## 2023-03-07 DIAGNOSIS — R000 Tachycardia, unspecified: Secondary | ICD-10-CM | POA: Insufficient documentation

## 2023-03-07 DIAGNOSIS — I132 Hypertensive heart and chronic kidney disease with heart failure and with stage 5 chronic kidney disease, or end stage renal disease: Secondary | ICD-10-CM | POA: Diagnosis not present

## 2023-03-07 DIAGNOSIS — I509 Heart failure, unspecified: Secondary | ICD-10-CM | POA: Diagnosis not present

## 2023-03-07 DIAGNOSIS — Z7902 Long term (current) use of antithrombotics/antiplatelets: Secondary | ICD-10-CM | POA: Diagnosis not present

## 2023-03-07 DIAGNOSIS — N186 End stage renal disease: Secondary | ICD-10-CM

## 2023-03-07 DIAGNOSIS — I10 Essential (primary) hypertension: Secondary | ICD-10-CM | POA: Diagnosis present

## 2023-03-07 DIAGNOSIS — E785 Hyperlipidemia, unspecified: Secondary | ICD-10-CM | POA: Diagnosis present

## 2023-03-07 DIAGNOSIS — J9601 Acute respiratory failure with hypoxia: Secondary | ICD-10-CM

## 2023-03-07 DIAGNOSIS — R0602 Shortness of breath: Secondary | ICD-10-CM | POA: Diagnosis present

## 2023-03-07 DIAGNOSIS — J90 Pleural effusion, not elsewhere classified: Secondary | ICD-10-CM

## 2023-03-07 DIAGNOSIS — D849 Immunodeficiency, unspecified: Secondary | ICD-10-CM | POA: Diagnosis present

## 2023-03-07 DIAGNOSIS — Z20822 Contact with and (suspected) exposure to covid-19: Secondary | ICD-10-CM | POA: Diagnosis not present

## 2023-03-07 DIAGNOSIS — J189 Pneumonia, unspecified organism: Secondary | ICD-10-CM

## 2023-03-07 HISTORY — DX: Essential (primary) hypertension: I10

## 2023-03-07 HISTORY — DX: Disorder of kidney and ureter, unspecified: N28.9

## 2023-03-07 HISTORY — DX: Anemia, unspecified: D64.9

## 2023-03-07 HISTORY — DX: Heart failure, unspecified: I50.9

## 2023-03-07 HISTORY — DX: Age-related osteoporosis without current pathological fracture: M81.0

## 2023-03-07 LAB — I-STAT ARTERIAL BLOOD GAS, ED
Acid-Base Excess: 3 mmol/L — ABNORMAL HIGH (ref 0.0–2.0)
Bicarbonate: 26 mmol/L (ref 20.0–28.0)
Calcium, Ion: 0.92 mmol/L — ABNORMAL LOW (ref 1.15–1.40)
HCT: 34 % — ABNORMAL LOW (ref 39.0–52.0)
Hemoglobin: 11.6 g/dL — ABNORMAL LOW (ref 13.0–17.0)
O2 Saturation: 96 %
Patient temperature: 98.6
Potassium: 4.6 mmol/L (ref 3.5–5.1)
Sodium: 134 mmol/L — ABNORMAL LOW (ref 135–145)
TCO2: 27 mmol/L (ref 22–32)
pCO2 arterial: 33.6 mmHg (ref 32–48)
pH, Arterial: 7.497 — ABNORMAL HIGH (ref 7.35–7.45)
pO2, Arterial: 76 mmHg — ABNORMAL LOW (ref 83–108)

## 2023-03-07 LAB — CBC WITH DIFFERENTIAL/PLATELET
Abs Immature Granulocytes: 0.21 10*3/uL — ABNORMAL HIGH (ref 0.00–0.07)
Basophils Absolute: 0 10*3/uL (ref 0.0–0.1)
Basophils Relative: 0 %
Eosinophils Absolute: 0.1 10*3/uL (ref 0.0–0.5)
Eosinophils Relative: 2 %
HCT: 40.3 % (ref 39.0–52.0)
Hemoglobin: 12 g/dL — ABNORMAL LOW (ref 13.0–17.0)
Immature Granulocytes: 4 %
Lymphocytes Relative: 3 %
Lymphs Abs: 0.2 10*3/uL — ABNORMAL LOW (ref 0.7–4.0)
MCH: 29.5 pg (ref 26.0–34.0)
MCHC: 29.8 g/dL — ABNORMAL LOW (ref 30.0–36.0)
MCV: 99 fL (ref 80.0–100.0)
Monocytes Absolute: 0.6 10*3/uL (ref 0.1–1.0)
Monocytes Relative: 9 %
Neutro Abs: 5 10*3/uL (ref 1.7–7.7)
Neutrophils Relative %: 82 %
Platelets: 140 10*3/uL — ABNORMAL LOW (ref 150–400)
RBC: 4.07 MIL/uL — ABNORMAL LOW (ref 4.22–5.81)
RDW: 14.6 % (ref 11.5–15.5)
WBC: 6.1 10*3/uL (ref 4.0–10.5)
nRBC: 0 % (ref 0.0–0.2)

## 2023-03-07 LAB — RESPIRATORY PANEL BY PCR
Adenovirus: NOT DETECTED
Bordetella Parapertussis: NOT DETECTED
Bordetella pertussis: NOT DETECTED
Chlamydophila pneumoniae: NOT DETECTED
Coronavirus 229E: NOT DETECTED
Coronavirus HKU1: NOT DETECTED
Coronavirus NL63: NOT DETECTED
Coronavirus OC43: NOT DETECTED
Influenza A: NOT DETECTED
Influenza B: NOT DETECTED
Metapneumovirus: NOT DETECTED
Mycoplasma pneumoniae: NOT DETECTED
Parainfluenza Virus 1: NOT DETECTED
Parainfluenza Virus 2: NOT DETECTED
Parainfluenza Virus 3: DETECTED — AB
Parainfluenza Virus 4: NOT DETECTED
Respiratory Syncytial Virus: NOT DETECTED
Rhinovirus / Enterovirus: DETECTED — AB

## 2023-03-07 LAB — RESP PANEL BY RT-PCR (RSV, FLU A&B, COVID)  RVPGX2
Influenza A by PCR: NEGATIVE
Influenza B by PCR: NEGATIVE
Resp Syncytial Virus by PCR: NEGATIVE
SARS Coronavirus 2 by RT PCR: NEGATIVE

## 2023-03-07 LAB — COMPREHENSIVE METABOLIC PANEL
ALT: 10 U/L (ref 0–44)
AST: 14 U/L — ABNORMAL LOW (ref 15–41)
Albumin: 3.6 g/dL (ref 3.5–5.0)
Alkaline Phosphatase: 62 U/L (ref 38–126)
Anion gap: 15 (ref 5–15)
BUN: 45 mg/dL — ABNORMAL HIGH (ref 8–23)
CO2: 27 mmol/L (ref 22–32)
Calcium: 8.4 mg/dL — ABNORMAL LOW (ref 8.9–10.3)
Chloride: 95 mmol/L — ABNORMAL LOW (ref 98–111)
Creatinine, Ser: 6.66 mg/dL — ABNORMAL HIGH (ref 0.61–1.24)
GFR, Estimated: 9 mL/min — ABNORMAL LOW (ref >60)
Glucose, Bld: 159 mg/dL — ABNORMAL HIGH (ref 70–99)
Potassium: 4.8 mmol/L (ref 3.5–5.1)
Sodium: 137 mmol/L (ref 135–145)
Total Bilirubin: 0.8 mg/dL (ref 0.3–1.2)
Total Protein: 6.6 g/dL (ref 6.5–8.1)

## 2023-03-07 LAB — HEPATITIS B SURFACE ANTIGEN: Hepatitis B Surface Ag: NONREACTIVE

## 2023-03-07 LAB — BRAIN NATRIURETIC PEPTIDE: B Natriuretic Peptide: 496.6 pg/mL — ABNORMAL HIGH (ref 0.0–100.0)

## 2023-03-07 LAB — TROPONIN I (HIGH SENSITIVITY)
Troponin I (High Sensitivity): 18 ng/L — ABNORMAL HIGH (ref <18)
Troponin I (High Sensitivity): 20 ng/L — ABNORMAL HIGH (ref <18)

## 2023-03-07 LAB — LACTIC ACID, PLASMA: Lactic Acid, Venous: 1.7 mmol/L (ref 0.5–1.9)

## 2023-03-07 MED ORDER — LIDOCAINE-PRILOCAINE 2.5-2.5 % EX CREA: 1.0000 | TOPICAL_CREAM | CUTANEOUS | Status: DC | PRN

## 2023-03-07 MED ORDER — PENTAFLUOROPROP-TETRAFLUOROETH EX AERO: 1.0000 | INHALATION_SPRAY | CUTANEOUS | Status: DC | PRN

## 2023-03-07 MED ORDER — ALTEPLASE 2 MG IJ SOLR: 2.0000 mg | Freq: Once | INTRAMUSCULAR | Status: DC | PRN

## 2023-03-07 MED ORDER — SODIUM CHLORIDE 0.9 % IV SOLN
1.0000 g | Freq: Once | INTRAVENOUS | Status: AC
  Administered 2023-03-07: 1 g via INTRAVENOUS
  Filled 2023-03-07: qty 10

## 2023-03-07 MED ORDER — CHLORHEXIDINE GLUCONATE CLOTH 2 % EX PADS: 6.0000 | MEDICATED_PAD | Freq: Every day | CUTANEOUS | Status: DC

## 2023-03-07 MED ORDER — IOHEXOL 350 MG/ML SOLN
75.0000 mL | Freq: Once | INTRAVENOUS | Status: AC | PRN
  Administered 2023-03-07: 75 mL via INTRAVENOUS

## 2023-03-07 MED ORDER — SODIUM CHLORIDE 0.9 % IV SOLN
500.0000 mg | Freq: Once | INTRAVENOUS | Status: AC
  Administered 2023-03-07: 500 mg via INTRAVENOUS
  Filled 2023-03-07: qty 5

## 2023-03-07 MED ORDER — LACTATED RINGERS IV BOLUS: 250.0000 mL | Freq: Once | INTRAVENOUS | Status: DC

## 2023-03-07 MED ORDER — LACTATED RINGERS IV BOLUS
500.0000 mL | Freq: Once | INTRAVENOUS | Status: AC
  Administered 2023-03-07: 500 mL via INTRAVENOUS

## 2023-03-07 MED ORDER — LIDOCAINE HCL (PF) 1 % IJ SOLN: 5.0000 mL | INTRAMUSCULAR | Status: DC | PRN

## 2023-03-07 MED ORDER — ONDANSETRON HCL 4 MG/2ML IJ SOLN
4.0000 mg | Freq: Once | INTRAMUSCULAR | Status: AC
  Administered 2023-03-07: 4 mg via INTRAVENOUS
  Filled 2023-03-07: qty 2

## 2023-03-07 MED ORDER — HEPARIN SODIUM (PORCINE) 1000 UNIT/ML DIALYSIS: 1000.0000 [IU] | INTRAMUSCULAR | Status: DC | PRN

## 2023-03-07 NOTE — ED Notes (Signed)
RT called for ABG.

## 2023-03-07 NOTE — ED Provider Notes (Signed)
Signout from Dr. Karene Fry.  67 year old male history of end-stage renal disease on dialysis Monday Wednesday Friday, history of cardiac transplant follows at [facility].  Here with increased shortness of breath.  Workup so far has found signs of pneumonia on CT angio chest.  He is receiving antibiotics.  Has a new increased oxygen requirement.  Will need admission.  Hospitalist is deferring to [facility] to see if they want him on their campus. Physical Exam  BP (!) 141/72   Pulse (!) 120   Temp 98.6 F (37 C) (Oral)   Resp (!) 23   Ht 5\' 8"  (1.727 m)   Wt 60.3 kg   SpO2 100%   BMI 20.21 kg/m   Physical Exam  Procedures  Procedures  ED Course / MDM   Clinical Course as of 03/07/23 2242  Tue Mar 07, 2023  0921 Pulse Rate(!): 124 [JL]  0921 Resp(!): 24 [JL]  1817 Rhinovirus / Enterovirus(!): DETECTED [JL]  1817 Parainfluenza Virus 3(!): DETECTED [JL]    Clinical Course User Index [JL] Ernie Avena, MD   Medical Decision Making Amount and/or Complexity of Data Reviewed Labs: ordered. Decision-making details documented in ED Course. Radiology: ordered.  Risk Prescription drug management. Decision regarding hospitalization.   10 PM.  Patient has been accepted by [facility] and there is a bed available.  Apparently [facility] is very busy and will be able to take him after midnight.  He has been persistently tachycardic although otherwise resting comfortably in no distress.  His workup shows that he tested positive for rhinovirus and parainfluenza virus.       Terrilee Files, MD 03/08/23 [phone]

## 2023-03-07 NOTE — ED Notes (Signed)
Called [facility] for an update they stated it will be after midnight for transporting the patient.

## 2023-03-07 NOTE — ED Notes (Signed)
Pt is on the list to tx to [facility] for [facility] unable to give pickup time. [facility] will not be transporting patient   rm # 3 [place]  [phone] admitting fluid overload. Receiving doctor lisa ray-jones . [facility] has a facesheet and all images have been pushed over

## 2023-03-07 NOTE — ED Notes (Signed)
Pt states he does not make urine.  

## 2023-03-07 NOTE — ED Notes (Signed)
[facility] to notify us know when a bed is available, and confirm transport services at that time, per Diplomatic Services operational officer.

## 2023-03-07 NOTE — ED Provider Notes (Signed)
[place] EMERGENCY DEPARTMENT AT [facility] Provider Note   CSN: [id number] Arrival date & time: 03/07/23  0845     History  Chief Complaint  Patient presents with  . Shortness of Breath  . Cough    Rickey Erickson is a 67 y.o. male.   Shortness of Breath Associated symptoms: cough   Cough Associated symptoms: shortness of breath      67 year old male with medical history significant for HTN, CHF, anemia, ESRD on HD MWF who presents to the emergency department with a productive cough with yellow sputum, increasing dyspnea.  The patient states that he went to urgent care yesterday and tested negative for COVID.  He endorses increasing shortness of breath.  He normally only wears 1 L O2 at night occasionally.  He has escalated to a need for 3 L O2 tonight due to persistent hypoxia.  He endorses increasing respiratory rate.  He denies any chest pain.  No fever or chills. No LE swelling although when he gets volume overloaded he does not get LE swelling.  Home Medications Prior to Admission medications   Medication Sig Start Date End Date Taking? Authorizing Provider  acetaminophen (TYLENOL) 500 MG tablet Take 500 mg by mouth every 6 (six) hours as needed for headache (pain).    [provider]  atorvastatin (LIPITOR) 20 MG tablet Take 20 mg by mouth every morning. 09/10/20   [provider]  calcium carbonate (OSCAL) 1500 (600 Ca) MG TABS tablet Take 1,500 mg by mouth every morning. 08/18/20   [provider]  clopidogrel (PLAVIX) 75 MG tablet Take 75 mg by mouth every morning. 05/20/21   [provider]  diltiazem (CARDIZEM CD) 180 MG 24 hr capsule Take 180 mg by mouth every evening. 05/27/21   [provider]  mirtazapine (REMERON) 45 MG tablet Take 45 mg by mouth every evening. 05/18/21   [provider]  mycophenolate (MYFORTIC) 180 MG EC tablet Take 180 mg by mouth 2 (two) times daily. 06/07/21   [provider]  pantoprazole (PROTONIX) 40 MG tablet Take 40 mg by mouth every morning. 09/02/20   [provider]  tacrolimus (PROGRAF) 1 MG capsule Take 1 mg by mouth 2 (two) times daily. Take with a 5 mg capsule for a total dose of 6 mg twice daily 09/04/20   [provider]  tacrolimus (PROGRAF) 5 MG capsule Take 5 mg by mouth 2 (two) times daily. Take with a 1 mg capsule for a total dose of 6 mg twice daily 05/10/21   [provider]  torsemide (DEMADEX) 100 MG tablet Take 100 mg by mouth See admin instructions. Take one tablet (100 mg) by mouth on Sunday, Tuesday, Thursday, Saturday mornings (non-dialysis days) 05/28/21   [provider]      Allergies    Scopolamine and Lorazepam    Review of Systems   Review of Systems  Respiratory:  Positive for cough and shortness of breath.   All other systems reviewed and are negative.   Physical Exam Updated Vital Signs BP (!) 146/75   Pulse (!) 120   Temp 98.6 F (37 C) (Oral)   Resp (!) 25   Ht 5\' 8"  (1.727 m)   Wt 60.3 kg   SpO2 99%   BMI 20.21 kg/m  Physical Exam Vitals and nursing note reviewed.  Constitutional:      General: He is not in acute distress.    Appearance: He is well-developed.  HENT:  Head: Normocephalic and atraumatic.  Eyes:     Conjunctiva/sclera: Conjunctivae normal.  Cardiovascular:     Rate and Rhythm: Regular rhythm. Tachycardia present.  Pulmonary:     Effort: Pulmonary effort is normal. Tachypnea present. No respiratory distress.     Breath sounds: No stridor. Examination of the right-lower field reveals rales. Examination of the left-lower field reveals rales. Rales present.  Abdominal:     Palpations: Abdomen is soft.     Tenderness: There is no abdominal tenderness.  Musculoskeletal:        General: No swelling.     Cervical back: Neck supple.  Skin:    General: Skin is warm and dry.     Capillary Refill: Capillary refill takes less than 2 seconds.  Neurological:      General: No focal deficit present.     Mental Status: He is alert and oriented to person, place, and time. Mental status is at baseline.  Psychiatric:        Mood and Affect: Mood normal.     ED Results / Procedures / Treatments   Labs (all labs ordered are listed, but only abnormal results are displayed) Labs Reviewed  CBC WITH DIFFERENTIAL/PLATELET - Abnormal; Notable for the following components:      Result Value   RBC 4.07 (*)    Hemoglobin 12.0 (*)    MCHC 29.8 (*)    Platelets 140 (*)    Lymphs Abs 0.2 (*)    Abs Immature Granulocytes 0.21 (*)    All other components within normal limits  CULTURE, BLOOD (ROUTINE X 2)  CULTURE, BLOOD (ROUTINE X 2)  LACTIC ACID, PLASMA  PROTIME-INR  COMPREHENSIVE METABOLIC PANEL    EKG EKG Interpretation  Date/Time:  Tuesday Mar 07 2023 09:00:59 EDT Ventricular Rate:  123 PR Interval:  153 QRS Duration: 105 QT Interval:  320 QTC Calculation: 458 R Axis:   143 Text Interpretation: Sinus tachycardia Probable right ventricular hypertrophy Confirmed by Ernie Avena ([id number]) on 03/07/2023 9:18:35 AM  Radiology CT Angio Chest PE W and/or Wo Contrast  Result Date: 03/07/2023 CLINICAL DATA:  Productive cough, shortness of breath, high clinical suspicion for PE EXAM: CT ANGIOGRAPHY CHEST WITH CONTRAST TECHNIQUE: Multidetector CT imaging of the chest was performed using the standard protocol during bolus administration of intravenous contrast. Multiplanar CT image reconstructions and MIPs were obtained to evaluate the vascular anatomy. RADIATION DOSE REDUCTION: This exam was performed according to the departmental dose-optimization program which includes automated exposure control, adjustment of the mA and/or kV according to patient size and/or use of iterative reconstruction technique. CONTRAST:  75mL OMNIPAQUE IOHEXOL 350 MG/ML SOLN COMPARISON:  Previous studies including the chest radiograph done earlier today and CT done on 10/17/2021  FINDINGS: Cardiovascular: There are no intraluminal filling defects in central pulmonary artery branches. Evaluation of small peripheral branches is limited by infiltrates and motion artifacts. There is homogeneous enhancement in thoracic aorta. Scattered calcifications are seen in thoracic aorta and its major branches. Coronary artery calcifications are seen. There are collateral vessels in subcutaneous plane in left chest wall extending to the abdomen. Distal course of the collateral vessels appears to be joining the left renal vein. There is a large caliber central venous catheter entering the right internal jugular. Mediastinum/Nodes: There are enlarged lymph nodes in mediastinum measuring up to 1.3 cm in short axis. Mediastinal lymph nodes appear slightly more prominent. Lungs/Pleura: Small to moderate bilateral pleural effusions are seen, more so on the right side. There are patchy  infiltrates in mid and lower lung fields, more so on the right side. There is no pneumothorax. Upper Abdomen: There are few calcific densities in both kidneys each measuring less than 9 mm in size. Musculoskeletal: There is decrease in height of bodies of T5, T11 and T12 vertebrae, more so in T11 and T12 vertebrae. Review of the MIP images confirms the above findings. IMPRESSION: There is no evidence of central pulmonary artery embolism. There is no evidence of thoracic aortic dissection. There are patchy infiltrates in mid and lower lung fields on both sides, more so on the right side suggesting atelectasis/pneumonia. Moderate right pleural effusion. Small left pleural effusion. Bilateral renal stones. Aortic arteriosclerosis. Coronary artery disease. There is decrease in height of the bodies of T5, T11 and T12 vertebrae suggesting recent or old compression fractures. Electronically Signed   By: Ernie Avena M.D.   On: 03/07/2023 14:36   DG Chest 2 View  Result Date: 03/07/2023 CLINICAL DATA:  Suspected Sepsis EXAM:  CHEST - 2 VIEW COMPARISON:  02/10/2022, 10/16/2021 FINDINGS: Right IJ approach central venous catheter terminates at the level of the right atrium. Stable heart size status post sternotomy. Moderate right and small left pleural effusions with bibasilar scarring and/or atelectasis. No pneumothorax. IMPRESSION: Moderate right and small left pleural effusions with bibasilar scarring and/or atelectasis. Electronically Signed   By: Duanne Guess D.O.   On: 03/07/2023 09:53    Procedures Procedures    Medications Ordered in ED Medications  cefTRIAXone (ROCEPHIN) 1 g in sodium chloride 0.9 % 100 mL IVPB (0 g Intravenous Stopped 03/07/23 1143)  azithromycin (ZITHROMAX) 500 mg in sodium chloride 0.9 % 250 mL IVPB (0 mg Intravenous Stopped 03/07/23 1253)  ondansetron (ZOFRAN) injection 4 mg (4 mg Intravenous Given 03/07/23 1301)  iohexol (OMNIPAQUE) 350 MG/ML injection 75 mL (75 mLs Intravenous Contrast Given 03/07/23 1339)    ED Course/ Medical Decision Making/ A&P Clinical Course as of 03/07/23 1818  Tue Mar 07, 2023  0921 Pulse Rate(!): 124 [JL]  0921 Resp(!): 24 [JL]  1817 Rhinovirus / Enterovirus(!): DETECTED [JL]  1817 Parainfluenza Virus 3(!): DETECTED [JL]    Clinical Course User Index [JL] Ernie Avena, MD                             Medical Decision Making Amount and/or Complexity of Data Reviewed Labs: ordered. Radiology: ordered.  Risk Prescription drug management. Decision regarding hospitalization.   67 year old male with medical history significant for HTN, CHF, anemia, ESRD on HD MWF who presents to the emergency department with a productive cough with yellow sputum, increasing dyspnea.  The patient states that he went to urgent care yesterday and tested negative for COVID.  He endorses increasing shortness of breath.  He normally only wears 1 L O2 at night occasionally.  He has escalated to a need for 3 L O2 tonight due to persistent hypoxia.  He endorses increasing  respiratory rate.  He denies any chest pain.  No fever or chills. No LE swelling although when he gets volume overloaded he does not get LE swelling.  On arrival, the patient was afebrile, temperature 99.2, tachycardic heart rate 124, tachypneic RR 24, BP 121/80, saturating 93% on room air, placed on 3 L O2 via nasal cannula.  Physical exam significant for bibasilar rales, tachypnea present, tachycardia present.  Differential diagnosis includes community-acquired pneumonia, viral infection, hypovolemia from ESRD and CHF, PE considered but less likely.  Patient  presenting with acute hypoxic respiratory failure, meeting SIRS criteria without clear source of infection, suspect respiratory.  Initial EKG revealed sinus tachycardia, ventricular rate 123, probable RVH present, no ischemic EKG changes with no ST segment elevations or T wave inversions.  A chest x-ray revealed moderate right and small left pleural effusions with bibasilar scarring and/or atelectasis, no focal consolidation, no pneumothorax.  CTA PE study was ordered to further evaluate for possible consolidation/pneumonia.  Laboratory evaluation significant for lactic acid 1.7, CBC without leukocytosis, mild anemia to 12.0, CMP collected and needed to be re-drawn.  Blood cultures x 2 collected and pending.  The patient was covered for community-acquired pneumonia with IV Rocephin and azithromycin.  Given the concern for mild hypervolemia, fluids were not administered. The patient does not make urine and would need dialysis for further volume removal.   CTA PE:  IMPRESSION:  There is no evidence of central pulmonary artery embolism. There is  no evidence of thoracic aortic dissection.    There are patchy infiltrates in mid and lower lung fields on both  sides, more so on the right side suggesting atelectasis/pneumonia.  Moderate right pleural effusion. Small left pleural effusion.    Bilateral renal stones. Aortic arteriosclerosis.  Coronary artery  disease.    There is decrease in height of the bodies of T5, T11 and T12  vertebrae suggesting recent or old compression fractures.    Given the patient's acute hypoxic respiratory failure, concern for pneumonia, pleural effusions in the setting of ESRD and likely need for dialysis, medicine was consulted for admission.  Nephrology also consulted, spoke with Dr. Arlean Hopping who will come evaluate the patient.Discussed with Dr. Alinda Money, who recommended that we engage with [facility] given his hx of cardiac transplant and his cardiology care being at [facility].   Spoke with the [facility] transfer line, spoke with on call cardiology. Dr. Freeman Caldron accepted the patient in transfer. Requested RVP and ABG to be performed. Pt CMP unfortunately needed to be re-drawn. Signout given to Dr. Charm Barges to follow-up remainder of pt labs. Part 1 of EMTALA completed.   Final Clinical Impression(s) / ED Diagnoses Final diagnoses:  Community acquired pneumonia, unspecified laterality  Acute respiratory failure with hypoxia (HCC)  ESRD (end stage renal disease) (HCC)  Pleural effusion    Rx / DC Orders ED Discharge Orders     None         Ernie Avena, MD 03/07/23 1653

## 2023-03-07 NOTE — ED Notes (Signed)
[facility] WILL CALL WHEN THEY HAVE A BED

## 2023-03-07 NOTE — ED Notes (Signed)
Pt and wife updated that [facility] will arrived sometime after midnight to transfer pt to [facility], both verbalized understanding. Pt resting and watching TV, NAD noted, observed even RR and unlabored, side rails up x2 for safety, plan of care ongoing, pt and wife expresses no needs or concerns at this time, call light within reach, no further concerns as of present.

## 2023-03-07 NOTE — ED Triage Notes (Signed)
Pt c/o SOB and productive cough w/yellow mucousx2d. Pt went to UC yesterday and tested neg for COVID. Pt was supposed to get CT of chest today, but was having increased SOB today. Pt dialysis pt Mon, Wed, Fri. Pt usually wears 1L 02 per [place] at night, but arrived wearing 3L 02 per [place] due to Sa02 at 90%. Pt is tachypneic.

## 2023-03-07 NOTE — Consult Note (Signed)
[facility] Renal Consultation Note    Indication for Consultation:  Management of ESRD/hemodialysis; anemia, hypertension/volume and secondary hyperparathyroidism  ZOX:WRUE, Lindi Adie, MD (Inactive)  HPI: Rickey Erickson is a 67 y.o. male with ESRD on HD MWF at [facility]. His past medical history is significant for HTN, CHF (s/p LVAD w/explant and heart transplant), and anemia who presents to the ED c/o a productive cough and dyspnea. He reports recently going to an UC and COVID test was negative. He endorsed worsening shortness of breath which prompted him to come into the ED for further evaluation. Seen and examined patient at bedside. Patient's sister also at bedside. He reports SOB with exertion but denies CP, palpitations, and N/V. CXR showed moderate right and small left pleural effusions with bibasilar scarring and/or atelectasis. Awaiting current lab results. Current BP is stable and he is euvolemic on exam. He is compliant at outpatient dialysis and doesn't miss a treatment. His last HD was yesterday and appears around 2L was removed. No emergent need for HD tonight. Plan for HD tomorrow 1st shift per his usual schedule.  Past Medical History:  Diagnosis Date   Anemia    Heart failure (HCC)    Hypertension    Osteoporosis    Renal disorder    Past Surgical History:  Procedure Laterality Date   AV FISTULA PLACEMENT Right    HEART TRANSPLANT     THORACENTESIS Right 06/22/2021   Procedure: THORACENTESIS;  Surgeon: Leslye Peer, MD;  Location: [facility] ENDOSCOPY;  Service: Cardiopulmonary;  Laterality: Right;   History reviewed. No pertinent family history. Social History:  reports that he has never smoked. He has never used smokeless tobacco. He reports that he does not drink alcohol and does not use drugs. Allergies  Allergen Reactions   Scopolamine Other (See Comments)     Hallucinations Induces severe delirium    Lorazepam Other (See Comments)     Hallucinations Severe somnolence/delirium    Prior to Admission medications   Medication Sig Start Date End Date Taking? Authorizing Provider  acetaminophen (TYLENOL) 500 MG tablet Take 500 mg by mouth every 6 (six) hours as needed for headache (pain).    [provider]  atorvastatin (LIPITOR) 20 MG tablet Take 20 mg by mouth every morning. 09/10/20   [provider]  calcium carbonate (OSCAL) 1500 (600 Ca) MG TABS tablet Take 1,500 mg by mouth every morning. 08/18/20   [provider]  clopidogrel (PLAVIX) 75 MG tablet Take 75 mg by mouth every morning. 05/20/21   [provider]  diltiazem (CARDIZEM CD) 180 MG 24 hr capsule Take 180 mg by mouth every evening. 05/27/21   [provider]  mirtazapine (REMERON) 45 MG tablet Take 45 mg by mouth every evening. 05/18/21   [provider]  mycophenolate (MYFORTIC) 180 MG EC tablet Take 180 mg by mouth 2 (two) times daily. 06/07/21   [provider]  pantoprazole (PROTONIX) 40 MG tablet Take 40 mg by mouth every morning. 09/02/20   [provider]  tacrolimus (PROGRAF) 1 MG capsule Take 1 mg by mouth 2 (two) times daily. Take with a 5 mg capsule for a total dose of 6 mg twice daily 09/04/20   [provider]  tacrolimus (PROGRAF) 5 MG capsule Take 5 mg by mouth 2 (two) times daily. Take with a 1 mg capsule for a total dose of 6 mg twice daily 05/10/21   [provider]  torsemide (DEMADEX) 100 MG tablet Take  100 mg by mouth See admin instructions. Take one tablet (100 mg) by mouth on Sunday, Tuesday, Thursday, Saturday mornings (non-dialysis days) 05/28/21   [provider]   No current facility-administered medications for this encounter.   Current Outpatient Medications  Medication Sig Dispense Refill   acetaminophen (TYLENOL) 500 MG tablet Take 500 mg by mouth every 6 (six) hours as needed for headache (pain).     atorvastatin (LIPITOR) 20 MG tablet Take 20  mg by mouth every morning.     calcium carbonate (OSCAL) 1500 (600 Ca) MG TABS tablet Take 1,500 mg by mouth every morning.     clopidogrel (PLAVIX) 75 MG tablet Take 75 mg by mouth every morning.     diltiazem (CARDIZEM CD) 180 MG 24 hr capsule Take 180 mg by mouth every evening.     mirtazapine (REMERON) 45 MG tablet Take 45 mg by mouth every evening.     mycophenolate (MYFORTIC) 180 MG EC tablet Take 180 mg by mouth 2 (two) times daily.     pantoprazole (PROTONIX) 40 MG tablet Take 40 mg by mouth every morning.     tacrolimus (PROGRAF) 1 MG capsule Take 1 mg by mouth 2 (two) times daily. Take with a 5 mg capsule for a total dose of 6 mg twice daily     tacrolimus (PROGRAF) 5 MG capsule Take 5 mg by mouth 2 (two) times daily. Take with a 1 mg capsule for a total dose of 6 mg twice daily     torsemide (DEMADEX) 100 MG tablet Take 100 mg by mouth See admin instructions. Take one tablet (100 mg) by mouth on Sunday, Tuesday, Thursday, Saturday mornings (non-dialysis days)     Labs: Basic Metabolic Panel: No results for input(s): "NA", "K", "CL", "CO2", "GLUCOSE", "BUN", "CREATININE", "CALCIUM", "PHOS" in the last 168 hours.  Invalid input(s): "ALB" Liver Function Tests: No results for input(s): "AST", "ALT", "ALKPHOS", "BILITOT", "PROT", "ALBUMIN" in the last 168 hours. No results for input(s): "LIPASE", "AMYLASE" in the last 168 hours. No results for input(s): "AMMONIA" in the last 168 hours. CBC: Recent Labs  Lab 03/07/23 0906  WBC 6.1  NEUTROABS 5.0  HGB 12.0*  HCT 40.3  MCV 99.0  PLT 140*   Cardiac Enzymes: No results for input(s): "CKTOTAL", "CKMB", "CKMBINDEX", "TROPONINI" in the last 168 hours. CBG: No results for input(s): "GLUCAP" in the last 168 hours. Iron Studies: No results for input(s): "IRON", "TIBC", "TRANSFERRIN", "FERRITIN" in the last 72 hours. Studies/Results: CT Angio Chest PE W and/or Wo Contrast  Result Date: 03/07/2023 CLINICAL DATA:  Productive cough,  shortness of breath, high clinical suspicion for PE EXAM: CT ANGIOGRAPHY CHEST WITH CONTRAST TECHNIQUE: Multidetector CT imaging of the chest was performed using the standard protocol during bolus administration of intravenous contrast. Multiplanar CT image reconstructions and MIPs were obtained to evaluate the vascular anatomy. RADIATION DOSE REDUCTION: This exam was performed according to the departmental dose-optimization program which includes automated exposure control, adjustment of the mA and/or kV according to patient size and/or use of iterative reconstruction technique. CONTRAST:  75mL OMNIPAQUE IOHEXOL 350 MG/ML SOLN COMPARISON:  Previous studies including the chest radiograph done earlier today and CT done on 10/17/2021 FINDINGS: Cardiovascular: There are no intraluminal filling defects in central pulmonary artery branches. Evaluation of small peripheral branches is limited by infiltrates and motion artifacts. There is homogeneous enhancement in thoracic aorta. Scattered calcifications are seen in thoracic aorta and its major branches. Coronary artery calcifications are seen. There are collateral  vessels in subcutaneous plane in left chest wall extending to the abdomen. Distal course of the collateral vessels appears to be joining the left renal vein. There is a large caliber central venous catheter entering the right internal jugular. Mediastinum/Nodes: There are enlarged lymph nodes in mediastinum measuring up to 1.3 cm in short axis. Mediastinal lymph nodes appear slightly more prominent. Lungs/Pleura: Small to moderate bilateral pleural effusions are seen, more so on the right side. There are patchy infiltrates in mid and lower lung fields, more so on the right side. There is no pneumothorax. Upper Abdomen: There are few calcific densities in both kidneys each measuring less than 9 mm in size. Musculoskeletal: There is decrease in height of bodies of T5, T11 and T12 vertebrae, more so in T11 and T12  vertebrae. Review of the MIP images confirms the above findings. IMPRESSION: There is no evidence of central pulmonary artery embolism. There is no evidence of thoracic aortic dissection. There are patchy infiltrates in mid and lower lung fields on both sides, more so on the right side suggesting atelectasis/pneumonia. Moderate right pleural effusion. Small left pleural effusion. Bilateral renal stones. Aortic arteriosclerosis. Coronary artery disease. There is decrease in height of the bodies of T5, T11 and T12 vertebrae suggesting recent or old compression fractures. Electronically Signed   By: Ernie Avena M.D.   On: 03/07/2023 14:36   DG Chest 2 View  Result Date: 03/07/2023 CLINICAL DATA:  Suspected Sepsis EXAM: CHEST - 2 VIEW COMPARISON:  02/10/2022, 10/16/2021 FINDINGS: Right IJ approach central venous catheter terminates at the level of the right atrium. Stable heart size status post sternotomy. Moderate right and small left pleural effusions with bibasilar scarring and/or atelectasis. No pneumothorax. IMPRESSION: Moderate right and small left pleural effusions with bibasilar scarring and/or atelectasis. Electronically Signed   By: Duanne Guess D.O.   On: 03/07/2023 09:53     Physical Exam: Vitals:   03/07/23 1408 03/07/23 1430 03/07/23 1630 03/07/23 1700  BP:  (!) 141/72 134/73 138/76  Pulse:  (!) 120 (!) 119 (!) 117  Resp:  (!) 23 (!) 26 (!) 27  Temp: 98.6 F (37 C)     TempSrc: Oral     SpO2:  100% 99% 99%  Weight:      Height:         General: Awake, alert, NAD Head: Sclera not icteric  Neck: No JVD Lungs: Coarse bilaterally. No wheeze or rales. Breathing is unlabored. Heart: RRR. No murmur, rubs or gallops.  Abdomen: soft, non-tender Lower extremities: no LE edema Neuro: AAOx3. Moves all extremities spontaneously. Psych:  Responds to questions appropriately with a normal affect. Dialysis Access: R AVG (+) B/T  Dialysis Orders:  MWF - [facility] 4hrs, BFR 400, DFR 1.5  Auto,  EDW 60kg, 2K/ 3Ca Heparin 2500 units with HD Venofer 100mg  IV (short load) ordered  Assessment/Plan: PNA - ABXs given, blood cultures drawn, managed by primary ESRD - on HD MWF. No emergent need for HD tonight. Plan for HD tomorrow morning Hypertension/volume  - BP stable and euvolemic on exam. Anemia of CKD - Hgb 11.6, no indication for ESA. Holding Fe given concern for infection Secondary Hyperparathyroidism -  Will check Phos in AM, awaiting other lab results Nutrition - Renal diet with fluid restriction  Salome Holmes, NP [facility] 03/07/2023, 5:32 PM

## 2023-03-08 ENCOUNTER — Ambulatory Visit: Admit: 2023-03-08 | Discharge: 2023-03-14 | Payer: MEDICARE

## 2023-03-08 ENCOUNTER — Ambulatory Visit
Admit: 2023-03-08 | Discharge: 2023-03-14 | Disposition: A | Discharge status: Home Health | Payer: MEDICARE | Source: Other Acute Inpatient Hospital | Admitting: Cardiovascular Disease

## 2023-03-08 DIAGNOSIS — Z992 Dependence on renal dialysis: Secondary | ICD-10-CM | POA: Diagnosis not present

## 2023-03-08 DIAGNOSIS — J90 Pleural effusion, not elsewhere classified: Secondary | ICD-10-CM | POA: Diagnosis not present

## 2023-03-08 DIAGNOSIS — Z20822 Contact with and (suspected) exposure to covid-19: Secondary | ICD-10-CM | POA: Diagnosis not present

## 2023-03-08 DIAGNOSIS — Z7902 Long term (current) use of antithrombotics/antiplatelets: Secondary | ICD-10-CM | POA: Diagnosis not present

## 2023-03-08 DIAGNOSIS — I132 Hypertensive heart and chronic kidney disease with heart failure and with stage 5 chronic kidney disease, or end stage renal disease: Secondary | ICD-10-CM | POA: Diagnosis not present

## 2023-03-08 DIAGNOSIS — R000 Tachycardia, unspecified: Secondary | ICD-10-CM | POA: Diagnosis not present

## 2023-03-08 DIAGNOSIS — B348 Other viral infections of unspecified site: Secondary | ICD-10-CM | POA: Diagnosis not present

## 2023-03-08 DIAGNOSIS — I509 Heart failure, unspecified: Secondary | ICD-10-CM | POA: Diagnosis not present

## 2023-03-08 DIAGNOSIS — N186 End stage renal disease: Secondary | ICD-10-CM | POA: Diagnosis not present

## 2023-03-08 DIAGNOSIS — J9601 Acute respiratory failure with hypoxia: Secondary | ICD-10-CM | POA: Diagnosis not present

## 2023-03-08 DIAGNOSIS — R0602 Shortness of breath: Secondary | ICD-10-CM | POA: Diagnosis present

## 2023-03-08 LAB — HEPATITIS B SURFACE ANTIBODY, QUANTITATIVE: Hep B S AB Quant (Post): 606 m[IU]/mL (ref >9.9)

## 2023-03-08 NOTE — ED Provider Notes (Signed)
[facility] here for transfer to [facility].  Patient denies shortness of breath.  No hypoxia on 3 L of oxygen.  Mild tachycardia at 100.  Denies chest pain.  Stable for transfer to [facility] as planned.  BP 137/77   Pulse (!) 104   Temp 97.8 F (36.6 C) (Oral)   Resp (!) 23   Ht 5\' 8"  (1.727 m)   Wt 60.3 kg   SpO2 99%   BMI 20.21 kg/m     Glynn Octave, MD 03/08/23 0202

## 2023-03-08 NOTE — ED Notes (Signed)
[facility] has arrived to transport pt to [facility]

## 2023-03-09 MED ORDER — ALBUTEROL SULFATE HFA 90 MCG/ACTUATION AEROSOL INHALER
Freq: Four times a day (QID) | RESPIRATORY_TRACT | 1 refills | 0 days | PRN
Start: 2023-03-09 — End: 2024-03-08

## 2023-03-12 LAB — CULTURE, BLOOD (ROUTINE X 2)
Culture: NO GROWTH
Culture: NO GROWTH
Special Requests: ADEQUATE

## 2023-03-13 DIAGNOSIS — Z79899 Other long term (current) drug therapy: Principal | ICD-10-CM

## 2023-03-13 DIAGNOSIS — Z941 Heart transplant status: Principal | ICD-10-CM

## 2023-03-13 MED ORDER — ALBUTEROL SULFATE HFA 90 MCG/ACTUATION AEROSOL INHALER
Freq: Four times a day (QID) | RESPIRATORY_TRACT | 0 refills | 0 days | PRN
Start: 2023-03-13 — End: 2024-03-12

## 2023-03-13 MED ORDER — AMOXICILLIN 500 MG-POTASSIUM CLAVULANATE 125 MG TABLET
ORAL_TABLET | ORAL | 0 refills | 4 days
Start: 2023-03-13 — End: 2023-03-17

## 2023-03-14 MED ORDER — ALBUTEROL SULFATE HFA 90 MCG/ACTUATION AEROSOL INHALER
Freq: Four times a day (QID) | RESPIRATORY_TRACT | 2 refills | 0 days | Status: CP | PRN
Start: 2023-03-14 — End: ?
  Filled 2023-03-14: qty 18, 25d supply, fill #0

## 2023-03-14 MED FILL — LEVOFLOXACIN 250 MG TABLET: ORAL | 1 days supply | Qty: 1 | Fill #0

## 2023-03-16 MED ORDER — LEVOFLOXACIN 250 MG TABLET
ORAL_TABLET | Freq: Once | ORAL | 0 refills | 1 days | Status: CP
Start: 2023-03-16 — End: 2023-03-16

## 2023-03-21 ENCOUNTER — Ambulatory Visit: Admit: 2023-03-21 | Discharge: 2023-03-21 | Payer: MEDICARE

## 2023-03-21 ENCOUNTER — Encounter
Admit: 2023-03-21 | Discharge: 2023-03-21 | Payer: MEDICARE | Attending: Cardiovascular Disease | Primary: Cardiovascular Disease

## 2023-03-21 MED ORDER — MIDODRINE 10 MG TABLET
ORAL_TABLET | Freq: Every day | ORAL | 11 refills | 30 days | PRN
Start: 2023-03-21 — End: ?

## 2023-03-24 ENCOUNTER — Ambulatory Visit: Admit: 2023-03-24 | Discharge: 2023-03-24 | Payer: MEDICARE

## 2023-03-30 MED ORDER — MIDODRINE 10 MG TABLET
ORAL_TABLET | Freq: Every day | ORAL | 11 refills | 30 days | Status: CP | PRN
Start: 2023-03-30 — End: ?

## 2023-04-04 ENCOUNTER — Ambulatory Visit: Admit: 2023-04-04 | Discharge: 2023-04-05 | Payer: MEDICARE

## 2023-04-10 DIAGNOSIS — R5381 Other malaise: Principal | ICD-10-CM

## 2023-04-10 DIAGNOSIS — J9690 Respiratory failure, unspecified, unspecified whether with hypoxia or hypercapnia: Principal | ICD-10-CM

## 2023-04-18 ENCOUNTER — Telehealth (HOSPITAL_COMMUNITY): Payer: Self-pay

## 2023-04-18 NOTE — Telephone Encounter (Signed)
Called patient to see if He was interested in participating in the pulmonary Rehab Program. Patient stated yes. Patient will come in for orientation on 7/1@9am  and will attend the 8:15 exercise class.   Sent package.

## 2023-04-20 ENCOUNTER — Ambulatory Visit: Payer: Medicare Other | Admitting: Physical Therapy

## 2023-04-24 ENCOUNTER — Encounter (HOSPITAL_COMMUNITY)
Admission: RE | Admit: 2023-04-24 | Discharge: 2023-04-24 | Disposition: A | Discharge status: Home / Self Care | Payer: Medicare Other | Source: Ambulatory Visit | Attending: Pulmonary Disease | Admitting: Pulmonary Disease

## 2023-04-24 ENCOUNTER — Encounter (HOSPITAL_COMMUNITY): Payer: Self-pay

## 2023-04-24 VITALS — BP 120/62 | HR 110 | Ht 68.0 in | Wt 134.7 lb

## 2023-04-24 DIAGNOSIS — J9600 Acute respiratory failure, unspecified whether with hypoxia or hypercapnia: Secondary | ICD-10-CM | POA: Insufficient documentation

## 2023-04-24 NOTE — Progress Notes (Signed)
Rickey Erickson 67 y.o. male  Pulmonary Rehab Orientation Note  This patient who was referred to Pulmonary Rehab by Dr. [name] from [facility] with Dr. Everardo All covering with the diagnosis of respiratory failure arrived today in Cardiac and Pulmonary Rehab. He arrived ambulatory with assistive device with normal gait. He does carry portable oxygen. [employer] is the provider for their DME. Per patient, [facility] uses oxygen continuously. Color good, skin warm and dry. Patient's medical history, psychosocial health, and medications reviewed. Psychosocial assessment reveals patient lives with sibling(s). Paycen is currently retired. Patient hobbies include watching tv. Patient reports his stress level is low. Areas of stress/anxiety include health. Patient does not exhibit signs of depression. PHQ2/9 score 0/0. Blaize shows good  coping skills with positive outlook on life. Offered emotional support and reassurance. Will continue to monitor and evaluate progress toward psychosocial goal(s) of decreased stress.   Physical assessment reveals patient is alert and oriented x 4. Heart rate is normal, breath sounds diminished to auscultation, no wheezes, rales, or rhonchi. Skip denies chronic cough. Bowel sounds present x4 quads.  Pt denies abdominal discomfort, nausea, vomiting, diarrhea or constipation. Grip strength equal, strong. Distal pulses +2; no swelling to lower extremities. Lukah reports he  does take medications as prescribed. Patient states he  follows a regular  diet. The patient has been trying to gain weight by eating more frequently. Pt's weight will be monitored closely.   Demonstration and practice of PLB using pulse oximeter. Gil able to return demonstration satisfactorily. Safety and hand hygiene in the exercise area reviewed with patient. Kyvan voices understanding of the information reviewed. Department expectations discussed with patient and achievable goals were set. The patient shows enthusiasm about  attending the program and we look forward to working with Jake Shark. Nandan completed a 6 min walk test today and is scheduled to begin exercise on July 9th at 08:15am.   [id number] Essie Hart, RN, BSN

## 2023-04-24 NOTE — Progress Notes (Signed)
Pulmonary Individual Treatment Plan  Patient Details  Name: Rickey Erickson MRN: [id number] Date of Birth: 1956-07-25 Referring Provider:   [name] Pulmonary Rehab Walk Test from 04/24/2023 in [facility]  Referring Provider [name]       Initial Encounter Date:  Flowsheet Row Pulmonary Rehab Walk Test from 04/24/2023 in [facility]  Date 04/24/23       Visit Diagnosis: Acute respiratory failure, unspecified whether with hypoxia or hypercapnia (HCC)  Patient's Home Medications on Admission:   Current Outpatient Medications:    acetaminophen (TYLENOL) 500 MG tablet, Take 500 mg by mouth every 6 (six) hours as needed for headache (pain)., Disp: , Rfl:    amLODipine (NORVASC) 10 MG tablet, Take by mouth., Disp: , Rfl:    atorvastatin (LIPITOR) 20 MG tablet, Take 20 mg by mouth every morning., Disp: , Rfl:    calcium carbonate (OSCAL) 1500 (600 Ca) MG TABS tablet, Take 1,500 mg by mouth every morning., Disp: , Rfl:    clopidogrel (PLAVIX) 75 MG tablet, Take 75 mg by mouth every morning., Disp: , Rfl:    mirtazapine (REMERON) 45 MG tablet, Take 45 mg by mouth every evening., Disp: , Rfl:    mycophenolate (MYFORTIC) 180 MG EC tablet, Take 180 mg by mouth 2 (two) times daily., Disp: , Rfl:    pantoprazole (PROTONIX) 40 MG tablet, Take 40 mg by mouth every morning., Disp: , Rfl:    tacrolimus (PROGRAF) 1 MG capsule, Take 1 mg by mouth 2 (two) times daily. Take with a 5 mg capsule for a total dose of 6 mg twice daily, Disp: , Rfl:    tacrolimus (PROGRAF) 5 MG capsule, Take 5 mg by mouth 2 (two) times daily. Take with a 1 mg capsule for a total dose of 6 mg twice daily, Disp: , Rfl:    diltiazem (CARDIZEM CD) 180 MG 24 hr capsule, Take 180 mg by mouth every evening. (Patient not taking: Reported on 04/24/2023), Disp: , Rfl:    torsemide (DEMADEX) 100 MG tablet, Take 100 mg by  mouth See admin instructions. Take one tablet (100 mg) by mouth on Sunday, Tuesday, Thursday, Saturday mornings (non-dialysis days) (Patient not taking: Reported on 04/24/2023), Disp: , Rfl:   Past Medical History: Past Medical History:  Diagnosis Date   Anemia    Heart failure (HCC)    Hypertension    Osteoporosis    Renal disorder     Tobacco Use: Social History   Tobacco Use  Smoking Status Never  Smokeless Tobacco Never    Labs: Review Flowsheet       Latest Ref Rng & Units 01/06/2022 09/08/2022 03/07/2023  Labs for ITP Cardiac and Pulmonary Rehab  Cholestrol 0 - 200 mg/dL 109  323  -  LDL (calc) 0 - 99 mg/dL 90  80  -  HDL-C >55 mg/dL 30  39  -  Trlycerides <150 mg/dL 90  86  -  Hemoglobin D3U 4.8 - 5.6 % 4.9  - -  PH, Arterial 7.35 - 7.45 - - 7.497   PCO2 arterial 32 - 48 mmHg - - 33.6   Bicarbonate 20.0 - 28.0 mmol/L - - 26.0   TCO2 22 - 32 mmol/L - - 27   O2 Saturation % - - 96     Capillary Blood Glucose: No results found for: "GLUCAP"   Pulmonary Assessment Scores:  Pulmonary Assessment  Scores     Row Name 04/24/23 0935         ADL UCSD   ADL Phase Entry     SOB Score total 50       CAT Score   CAT Score 16       mMRC Score   mMRC Score 3             UCSD: Self-administered rating of dyspnea associated with activities of daily living (ADLs) 6-point scale (0 = "not at all" to 5 = "maximal or unable to do because of breathlessness")  Scoring Scores range from 0 to 120.  Minimally important difference is 5 units  CAT: CAT can identify the health impairment of COPD patients and is better correlated with disease progression.  CAT has a scoring range of zero to 40. The CAT score is classified into four groups of low (less than 10), medium (10 - 20), high (21-30) and very high (31-40) based on the impact level of disease on health status. A CAT score over 10 suggests significant symptoms.  A worsening CAT score could be explained by an  exacerbation, poor medication adherence, poor inhaler technique, or progression of COPD or comorbid conditions.  CAT MCID is 2 points  mMRC: mMRC (Modified Medical Research Council) Dyspnea Scale is used to assess the degree of baseline functional disability in patients of respiratory disease due to dyspnea. No minimal important difference is established. A decrease in score of 1 point or greater is considered a positive change.   Pulmonary Function Assessment:  Pulmonary Function Assessment - 04/24/23 0933       Breath   Bilateral Breath Sounds Decreased    Shortness of Breath Yes;Limiting activity             Exercise Target Goals: Exercise Program Goal: Individual exercise prescription set using results from initial 6 min walk test and THRR while considering  patient's activity barriers and safety.   Exercise Prescription Goal: Initial exercise prescription builds to 30-45 minutes a day of aerobic activity, 2-3 days per week.  Home exercise guidelines will be given to patient during program as part of exercise prescription that the participant will acknowledge.  Activity Barriers & Risk Stratification:  Activity Barriers & Cardiac Risk Stratification - 04/24/23 0936       Activity Barriers & Cardiac Risk Stratification   Activity Barriers Balance Concerns;Shortness of Breath;Muscular Weakness;Deconditioning   Rt arm herograft            6 Minute Walk:  6 Minute Walk     Row Name 04/24/23 1112         6 Minute Walk   Phase Initial     Distance 1015 feet     Walk Time 6 minutes     # of Rest Breaks 0     MPH 1.92     METS 3.36     RPE 11     Perceived Dyspnea  3     VO2 Peak 11.75     Symptoms No     Resting HR 110 bpm     Resting BP 120/62     Resting Oxygen Saturation  98 %     Exercise Oxygen Saturation  during 6 min walk 95 %     Max Ex. HR 124 bpm     Max Ex. BP 132/64     2 Minute Post BP 122/64       Interval HR   1 Minute  HR 114     2  Minute HR 118     3 Minute HR 121     4 Minute HR 123     5 Minute HR 123     6 Minute HR 124     Interval Heart Rate? Yes       Interval Oxygen   Interval Oxygen? Yes     Baseline Oxygen Saturation % 98 %     1 Minute Oxygen Saturation % 99 %     1 Minute Liters of Oxygen 2 L     2 Minute Oxygen Saturation % 97 %     2 Minute Liters of Oxygen 2 L     3 Minute Oxygen Saturation % 95 %     3 Minute Liters of Oxygen 2 L     4 Minute Oxygen Saturation % 97 %     4 Minute Liters of Oxygen 2 L     5 Minute Oxygen Saturation % 95 %     5 Minute Liters of Oxygen 2 L     6 Minute Oxygen Saturation % 96 %     6 Minute Liters of Oxygen 2 L     2 Minute Post Oxygen Saturation % 100 %     2 Minute Post Liters of Oxygen 2 L              Oxygen Initial Assessment:  Oxygen Initial Assessment - 04/24/23 0932       Home Oxygen   Home Oxygen Device E-Tanks;Home Concentrator    Sleep Oxygen Prescription Continuous    Liters per minute 1    Home Exercise Oxygen Prescription Continuous    Liters per minute 4    Home Resting Oxygen Prescription Continuous    Compliance with Home Oxygen Use Yes      Initial 6 min Walk   Oxygen Used Continuous    Liters per minute 4      Program Oxygen Prescription   Program Oxygen Prescription Continuous    Liters per minute 4      Intervention   Short Term Goals To learn and exhibit compliance with exercise, home and travel O2 prescription;To learn and understand importance of maintaining oxygen saturations>88%;To learn and demonstrate proper use of respiratory medications;To learn and understand importance of monitoring SPO2 with pulse oximeter and demonstrate accurate use of the pulse oximeter.;To learn and demonstrate proper pursed lip breathing techniques or other breathing techniques.     Long  Term Goals Exhibits compliance with exercise, home  and travel O2 prescription;Verbalizes importance of monitoring SPO2 with pulse oximeter and return  demonstration;Exhibits proper breathing techniques, such as pursed lip breathing or other method taught during program session;Maintenance of O2 saturations>88%;Compliance with respiratory medication             Oxygen Re-Evaluation:   Oxygen Discharge (Final Oxygen Re-Evaluation):   Initial Exercise Prescription:  Initial Exercise Prescription - 04/24/23 0900       Date of Initial Exercise RX and Referring Provider   Date 04/24/23    Referring Provider [name]    Expected Discharge Date 07/20/23      Oxygen   Oxygen Continuous    Liters 2    Maintain Oxygen Saturation 88% or higher      NuStep   Level 1    SPM 50    Minutes 15      Track   Minutes 15    METs 1.5  Prescription Details   Frequency (times per week) 2    Duration Progress to 30 minutes of continuous aerobic without signs/symptoms of physical distress      Intensity   THRR 40-80% of Max Heartrate 62-124    Ratings of Perceived Exertion 11-13    Perceived Dyspnea 0-4      Progression   Progression Continue to progress workloads to maintain intensity without signs/symptoms of physical distress.      Resistance Training   Training Prescription Yes    Weight red bands    Reps 10-15             Perform Capillary Blood Glucose checks as needed.  Exercise Prescription Changes:   Exercise Comments:   Exercise Goals and Review:   Exercise Goals     Row Name 04/24/23 0936             Exercise Goals   Increase Physical Activity Yes       Intervention Provide advice, education, support and counseling about physical activity/exercise needs.;Develop an individualized exercise prescription for aerobic and resistive training based on initial evaluation findings, risk stratification, comorbidities and participant's personal goals.       Expected Outcomes Short Term: Attend rehab on a regular basis to increase amount of physical activity.;Long Term: Exercising regularly at least 3-5 days  a week.;Long Term: Add in home exercise to make exercise part of routine and to increase amount of physical activity.       Increase Strength and Stamina Yes       Intervention Provide advice, education, support and counseling about physical activity/exercise needs.;Develop an individualized exercise prescription for aerobic and resistive training based on initial evaluation findings, risk stratification, comorbidities and participant's personal goals.       Expected Outcomes Short Term: Increase workloads from initial exercise prescription for resistance, speed, and METs.;Short Term: Perform resistance training exercises routinely during rehab and add in resistance training at home;Long Term: Improve cardiorespiratory fitness, muscular endurance and strength as measured by increased METs and functional capacity ( )       Able to understand and use rate of perceived exertion (RPE) scale Yes       Intervention Provide education and explanation on how to use RPE scale       Expected Outcomes Short Term: Able to use RPE daily in rehab to express subjective intensity level;Long Term:  Able to use RPE to guide intensity level when exercising independently       Able to understand and use Dyspnea scale Yes       Intervention Provide education and explanation on how to use Dyspnea scale       Expected Outcomes Short Term: Able to use Dyspnea scale daily in rehab to express subjective sense of shortness of breath during exertion;Long Term: Able to use Dyspnea scale to guide intensity level when exercising independently       Knowledge and understanding of Target Heart Rate Range (THRR) Yes       Intervention Provide education and explanation of THRR including how the numbers were predicted and where they are located for reference       Expected Outcomes Short Term: Able to state/look up THRR;Long Term: Able to use THRR to govern intensity when exercising independently;Short Term: Able to use daily as  guideline for intensity in rehab       Understanding of Exercise Prescription Yes       Intervention Provide education, explanation, and written materials on patient's  individual exercise prescription       Expected Outcomes Short Term: Able to explain program exercise prescription;Long Term: Able to explain home exercise prescription to exercise independently                Exercise Goals Re-Evaluation :   Discharge Exercise Prescription (Final Exercise Prescription Changes):   Nutrition:  Target Goals: Understanding of nutrition guidelines, daily intake of sodium 1500mg , cholesterol 200mg , calories 30% from fat and 7% or less from saturated fats, daily to have 5 or more servings of fruits and vegetables.  Biometrics:  Pre Biometrics - 04/24/23 0914       Pre Biometrics   Grip Strength 29 kg              Nutrition Therapy Plan and Nutrition Goals:   Nutrition Assessments:  MEDIFICTS Score Key: ?70 Need to make dietary changes  40-70 Heart Healthy Diet ? 40 Therapeutic Level Cholesterol Diet   Picture Your Plate Scores: <16 Unhealthy dietary pattern with much room for improvement. 41-50 Dietary pattern unlikely to meet recommendations for good health and room for improvement. 51-60 More healthful dietary pattern, with some room for improvement.  >60 Healthy dietary pattern, although there may be some specific behaviors that could be improved.    Nutrition Goals Re-Evaluation:   Nutrition Goals Discharge (Final Nutrition Goals Re-Evaluation):   Psychosocial: Target Goals: Acknowledge presence or absence of significant depression and/or stress, maximize coping skills, provide positive support system. Participant is able to verbalize types and ability to use techniques and skills needed for reducing stress and depression.  Initial Review & Psychosocial Screening:  Initial Psych Review & Screening - 04/24/23 0923       Initial Review   Current issues  with None Identified      Family Dynamics   Good Support System? Yes    Comments sister and other family members      Barriers   Psychosocial barriers to participate in program There are no identifiable barriers or psychosocial needs.      Screening Interventions   Interventions Encouraged to exercise             Quality of Life Scores:  Scores of 19 and below usually indicate a poorer quality of life in these areas.  A difference of  2-3 points is a clinically meaningful difference.  A difference of 2-3 points in the total score of the Quality of Life Index has been associated with significant improvement in overall quality of life, self-image, physical symptoms, and general health in studies assessing change in quality of life.  PHQ-9: Review Flowsheet       04/24/2023  Depression screen PHQ 2/9  Decreased Interest 0  Down, Depressed, Hopeless 0  PHQ - 2 Score 0  Altered sleeping 0  Tired, decreased energy 0  Change in appetite 0  Feeling bad or failure about yourself  0  Trouble concentrating 0  Moving slowly or fidgety/restless 0  Suicidal thoughts 0  PHQ-9 Score 0  Difficult doing work/chores Not difficult at all   Interpretation of Total Score  Total Score Depression Severity:  1-4 = Minimal depression, 5-9 = Mild depression, 10-14 = Moderate depression, 15-19 = Moderately severe depression, 20-27 = Severe depression   Psychosocial Evaluation and Intervention:  Psychosocial Evaluation - 04/24/23 0923       Psychosocial Evaluation & Interventions   Interventions Encouraged to exercise with the program and follow exercise prescription    Comments Skip denies any  psychosocial barriers to exercise.    Expected Outcomes For Skip to participate in PR free of psychosocial barriers.    Continue Psychosocial Services  No Follow up required             Psychosocial Re-Evaluation:   Psychosocial Discharge (Final Psychosocial  Re-Evaluation):   Education: Education Goals: Education classes will be provided on a weekly basis, covering required topics. Participant will state understanding/return demonstration of topics presented.  Learning Barriers/Preferences:  Learning Barriers/Preferences - 04/24/23 0924       Learning Barriers/Preferences   Learning Barriers Sight    Learning Preferences Skilled Demonstration             Education Topics: Introduction to Pulmonary Rehab Group instruction provided by PowerPoint, verbal discussion, and written material to support subject matter. Instructor reviews what Pulmonary Rehab is, the purpose of the program, and how patients are referred.     Know Your Numbers Group instruction that is supported by a PowerPoint presentation. Instructor discusses importance of knowing and understanding resting, exercise, and post-exercise oxygen saturation, heart rate, and blood pressure. Oxygen saturation, heart rate, blood pressure, rating of perceived exertion, and dyspnea are reviewed along with a normal range for these values.    Exercise for the Pulmonary Patient Group instruction that is supported by a PowerPoint presentation. Instructor discusses benefits of exercise, core components of exercise, frequency, duration, and intensity of an exercise routine, importance of utilizing pulse oximetry during exercise, safety while exercising, and options of places to exercise outside of rehab.    MET Level  Group instruction provided by PowerPoint, verbal discussion, and written material to support subject matter. Instructor reviews what METs are and how to increase METs.    Pulmonary Medications Verbally interactive group education provided by instructor with focus on inhaled medications and proper administration.   Anatomy and Physiology of the Respiratory System Group instruction provided by PowerPoint, verbal discussion, and written material to support subject matter.  Instructor reviews respiratory cycle and anatomical components of the respiratory system and their functions. Instructor also reviews differences in obstructive and restrictive respiratory diseases with examples of each.    Oxygen Safety Group instruction provided by PowerPoint, verbal discussion, and written material to support subject matter. There is an overview of "What is Oxygen" and "Why do we need it".  Instructor also reviews how to create a safe environment for oxygen use, the importance of using oxygen as prescribed, and the risks of noncompliance. There is a brief discussion on traveling with oxygen and resources the patient may utilize.   Oxygen Use Group instruction provided by PowerPoint, verbal discussion, and written material to discuss how supplemental oxygen is prescribed and different types of oxygen supply systems. Resources for more information are provided.    Breathing Techniques Group instruction that is supported by demonstration and informational handouts. Instructor discusses the benefits of pursed lip and diaphragmatic breathing and detailed demonstration on how to perform both.     Risk Factor Reduction Group instruction that is supported by a PowerPoint presentation. Instructor discusses the definition of a risk factor, different risk factors for pulmonary disease, and how the heart and lungs work together.   MD Day A group question and answer session with a medical doctor that allows participants to ask questions that relate to their pulmonary disease state.   Nutrition for the Pulmonary Patient Group instruction provided by PowerPoint slides, verbal discussion, and written materials to support subject matter. The instructor gives an explanation and review of  healthy diet recommendations, which includes a discussion on weight management, recommendations for fruit and vegetable consumption, as well as protein, fluid, caffeine, fiber, sodium, sugar, and alcohol.  Tips for eating when patients are short of breath are discussed.    Other Education Group or individual verbal, written, or video instructions that support the educational goals of the pulmonary rehab program.    Knowledge Questionnaire Score:  Knowledge Questionnaire Score - 04/24/23 0924       Knowledge Questionnaire Score   Pre Score 18/18             Core Components/Risk Factors/Patient Goals at Admission:  Personal Goals and Risk Factors at Admission - 04/24/23 0928       Core Components/Risk Factors/Patient Goals on Admission    Weight Management Yes;Weight Gain    Intervention Weight Management: Develop a combined nutrition and exercise program designed to reach desired caloric intake, while maintaining appropriate intake of nutrient and fiber, sodium and fats, and appropriate energy expenditure required for the weight goal.;Weight Management: Provide education and appropriate resources to help participant work on and attain dietary goals.    Expected Outcomes Weight Gain: Understanding of general recommendations for a high calorie, high protein meal plan that promotes weight gain by distributing calorie intake throughout the day with the consumption for 4-5 meals, snacks, and/or supplements;Understanding of distribution of calorie intake throughout the day with the consumption of 4-5 meals/snacks;Understanding recommendations for meals to include 15-35% energy as protein, 25-35% energy from fat, 35-60% energy from carbohydrates, less than 200mg  of dietary cholesterol, 20-35 gm of total fiber daily;Short Term: Continue to assess and modify interventions until short term weight is achieved;Long Term: Adherence to nutrition and physical activity/exercise program aimed toward attainment of established weight goal    Improve shortness of breath with ADL's Yes    Intervention Provide education, individualized exercise plan and daily activity instruction to help decrease symptoms of SOB  with activities of daily living.    Expected Outcomes Short Term: Improve cardiorespiratory fitness to achieve a reduction of symptoms when performing ADLs;Long Term: Be able to perform more ADLs without symptoms or delay the onset of symptoms             Core Components/Risk Factors/Patient Goals Review:    Core Components/Risk Factors/Patient Goals at Discharge (Final Review):    ITP Comments:   Comments: Dr. Mechele Collin is Medical Director for Pulmonary Rehab at [facility].

## 2023-05-02 ENCOUNTER — Encounter (HOSPITAL_COMMUNITY): Admission: RE | Admit: 2023-05-02 | Payer: Medicare Other | Source: Ambulatory Visit

## 2023-05-02 VITALS — Wt 133.2 lb

## 2023-05-02 DIAGNOSIS — J9600 Acute respiratory failure, unspecified whether with hypoxia or hypercapnia: Secondary | ICD-10-CM

## 2023-05-02 NOTE — Progress Notes (Signed)
Daily Session Note  Patient Details  Name: Rickey Erickson MRN: [id number] Date of Birth: October 31, 1955 Referring Provider:   [name] Pulmonary Rehab Walk Test from 04/24/2023 in [facility]  Referring Provider [name]       Encounter Date: 05/02/2023  Check In:  Session Check In - 05/02/23 7829       Check-In   Supervising physician immediately available to respond to emergencies [facility] MD immediately available    Physician(s) Carlyon Shadow, NP    Location [facility]    Staff Present Essie Hart, RN, Doris Cheadle, MS, ACSM-CEP, Exercise Physiologist;Shameria Trimarco Katrinka Blazing, RT    Virtual Visit No    Medication changes reported     No    Fall or balance concerns reported    No    Tobacco Cessation No Change    Warm-up and Cool-down Performed as group-led instruction    Resistance Training Performed Yes    VAD Patient? No    PAD/SET Patient? No      Pain Assessment   Currently in Pain? No/denies    Multiple Pain Sites No             Capillary Blood Glucose: No results found for this or any previous visit (from the past 24 hour(s)).   Exercise Prescription Changes - 05/02/23 0900       Response to Exercise   Blood Pressure (Admit) 118/70    Blood Pressure (Exercise) 140/70    Blood Pressure (Exit) 106/64    Heart Rate (Admit) 109 bpm    Heart Rate (Exercise) 119 bpm    Heart Rate (Exit) 111 bpm    Oxygen Saturation (Admit) 100 %    Oxygen Saturation (Exercise) 100 %    Oxygen Saturation (Exit) 100 %    Rating of Perceived Exertion (Exercise) 13    Perceived Dyspnea (Exercise) 1    Duration Continue with 30 min of aerobic exercise without signs/symptoms of physical distress.    Intensity THRR unchanged      Progression   Progression Continue to progress workloads to maintain intensity without signs/symptoms of physical distress.      Resistance Training   Training Prescription Yes     Weight red bands    Reps 10-15    Time 10 Minutes      Oxygen   Oxygen Continuous    Liters 2      NuStep   Level 1    Minutes 15    METs 2.4      Track   Laps 7.5    Minutes 15    METs 2.23      Oxygen   Maintain Oxygen Saturation 88% or higher             Social History   Tobacco Use  Smoking Status Never  Smokeless Tobacco Never    Goals Met:  Proper associated with RPD/PD & O2 Sat Independence with exercise equipment Exercise tolerated well No report of concerns or symptoms today Strength training completed today  Goals Unmet:  Not Applicable  Comments: Service time is from 0806 to 0940.    Dr. Mechele Collin is Medical Director for Pulmonary Rehab at [facility].

## 2023-05-03 NOTE — Progress Notes (Signed)
Pulmonary Individual Treatment Plan  Patient Details  Name: Rickey Erickson MRN: [id number] Date of Birth: 12-Mar-1956 Referring Provider:   [name] Pulmonary Rehab Walk Test from 04/24/2023 in [facility]  Referring Provider [name]       Initial Encounter Date:  Flowsheet Row Pulmonary Rehab Walk Test from 04/24/2023 in [facility]  Date 04/24/23       Visit Diagnosis: Acute respiratory failure, unspecified whether with hypoxia or hypercapnia (HCC)  Patient's Home Medications on Admission:   Current Outpatient Medications:    acetaminophen (TYLENOL) 500 MG tablet, Take 500 mg by mouth every 6 (six) hours as needed for headache (pain)., Disp: , Rfl:    amLODipine (NORVASC) 10 MG tablet, Take by mouth., Disp: , Rfl:    atorvastatin (LIPITOR) 20 MG tablet, Take 20 mg by mouth every morning., Disp: , Rfl:    calcium carbonate (OSCAL) 1500 (600 Ca) MG TABS tablet, Take 1,500 mg by mouth every morning., Disp: , Rfl:    clopidogrel (PLAVIX) 75 MG tablet, Take 75 mg by mouth every morning., Disp: , Rfl:    diltiazem (CARDIZEM CD) 180 MG 24 hr capsule, Take 180 mg by mouth every evening. (Patient not taking: Reported on 04/24/2023), Disp: , Rfl:    mirtazapine (REMERON) 45 MG tablet, Take 45 mg by mouth every evening., Disp: , Rfl:    mycophenolate (MYFORTIC) 180 MG EC tablet, Take 180 mg by mouth 2 (two) times daily., Disp: , Rfl:    pantoprazole (PROTONIX) 40 MG tablet, Take 40 mg by mouth every morning., Disp: , Rfl:    tacrolimus (PROGRAF) 1 MG capsule, Take 1 mg by mouth 2 (two) times daily. Take with a 5 mg capsule for a total dose of 6 mg twice daily, Disp: , Rfl:    tacrolimus (PROGRAF) 5 MG capsule, Take 5 mg by mouth 2 (two) times daily. Take with a 1 mg capsule for a total dose of 6 mg twice daily, Disp: , Rfl:    torsemide (DEMADEX) 100 MG tablet, Take 100 mg by  mouth See admin instructions. Take one tablet (100 mg) by mouth on Sunday, Tuesday, Thursday, Saturday mornings (non-dialysis days) (Patient not taking: Reported on 04/24/2023), Disp: , Rfl:   Past Medical History: Past Medical History:  Diagnosis Date   Anemia    Heart failure (HCC)    Hypertension    Osteoporosis    Renal disorder     Tobacco Use: Social History   Tobacco Use  Smoking Status Never  Smokeless Tobacco Never    Labs: Review Flowsheet       Latest Ref Rng & Units 01/06/2022 09/08/2022 03/07/2023  Labs for ITP Cardiac and Pulmonary Rehab  Cholestrol 0 - 200 mg/dL 409  811  -  LDL (calc) 0 - 99 mg/dL 90  80  -  HDL-C >91 mg/dL 30  39  -  Trlycerides <150 mg/dL 90  86  -  Hemoglobin Y7W 4.8 - 5.6 % 4.9  - -  PH, Arterial 7.35 - 7.45 - - 7.497   PCO2 arterial 32 - 48 mmHg - - 33.6   Bicarbonate 20.0 - 28.0 mmol/L - - 26.0   TCO2 22 - 32 mmol/L - - 27   O2 Saturation % - - 96     Capillary Blood Glucose: No results found for: "GLUCAP"   Pulmonary Assessment Scores:  Pulmonary Assessment  Scores     Row Name 04/24/23 0935         ADL UCSD   ADL Phase Entry     SOB Score total 50       CAT Score   CAT Score 16       mMRC Score   mMRC Score 3             UCSD: Self-administered rating of dyspnea associated with activities of daily living (ADLs) 6-point scale (0 = "not at all" to 5 = "maximal or unable to do because of breathlessness")  Scoring Scores range from 0 to 120.  Minimally important difference is 5 units  CAT: CAT can identify the health impairment of COPD patients and is better correlated with disease progression.  CAT has a scoring range of zero to 40. The CAT score is classified into four groups of low (less than 10), medium (10 - 20), high (21-30) and very high (31-40) based on the impact level of disease on health status. A CAT score over 10 suggests significant symptoms.  A worsening CAT score could be explained by an  exacerbation, poor medication adherence, poor inhaler technique, or progression of COPD or comorbid conditions.  CAT MCID is 2 points  mMRC: mMRC (Modified Medical Research Council) Dyspnea Scale is used to assess the degree of baseline functional disability in patients of respiratory disease due to dyspnea. No minimal important difference is established. A decrease in score of 1 point or greater is considered a positive change.   Pulmonary Function Assessment:  Pulmonary Function Assessment - 04/24/23 0933       Breath   Bilateral Breath Sounds Decreased    Shortness of Breath Yes;Limiting activity             Exercise Target Goals: Exercise Program Goal: Individual exercise prescription set using results from initial 6 min walk test and THRR while considering  patient's activity barriers and safety.   Exercise Prescription Goal: Initial exercise prescription builds to 30-45 minutes a day of aerobic activity, 2-3 days per week.  Home exercise guidelines will be given to patient during program as part of exercise prescription that the participant will acknowledge.  Activity Barriers & Risk Stratification:  Activity Barriers & Cardiac Risk Stratification - 04/24/23 0936       Activity Barriers & Cardiac Risk Stratification   Activity Barriers Balance Concerns;Shortness of Breath;Muscular Weakness;Deconditioning   Rt arm herograft            6 Minute Walk:  6 Minute Walk     Row Name 04/24/23 1112         6 Minute Walk   Phase Initial     Distance 1015 feet     Walk Time 6 minutes     # of Rest Breaks 0     MPH 1.92     METS 3.36     RPE 11     Perceived Dyspnea  3     VO2 Peak 11.75     Symptoms No     Resting HR 110 bpm     Resting BP 120/62     Resting Oxygen Saturation  98 %     Exercise Oxygen Saturation  during 6 min walk 95 %     Max Ex. HR 124 bpm     Max Ex. BP 132/64     2 Minute Post BP 122/64       Interval HR   1 Minute  HR 114     2  Minute HR 118     3 Minute HR 121     4 Minute HR 123     5 Minute HR 123     6 Minute HR 124     Interval Heart Rate? Yes       Interval Oxygen   Interval Oxygen? Yes     Baseline Oxygen Saturation % 98 %     1 Minute Oxygen Saturation % 99 %     1 Minute Liters of Oxygen 2 L     2 Minute Oxygen Saturation % 97 %     2 Minute Liters of Oxygen 2 L     3 Minute Oxygen Saturation % 95 %     3 Minute Liters of Oxygen 2 L     4 Minute Oxygen Saturation % 97 %     4 Minute Liters of Oxygen 2 L     5 Minute Oxygen Saturation % 95 %     5 Minute Liters of Oxygen 2 L     6 Minute Oxygen Saturation % 96 %     6 Minute Liters of Oxygen 2 L     2 Minute Post Oxygen Saturation % 100 %     2 Minute Post Liters of Oxygen 2 L              Oxygen Initial Assessment:  Oxygen Initial Assessment - 04/24/23 0932       Home Oxygen   Home Oxygen Device E-Tanks;Home Concentrator    Sleep Oxygen Prescription Continuous    Liters per minute 1    Home Exercise Oxygen Prescription Continuous    Liters per minute 2    Home Resting Oxygen Prescription Continuous    Liters per minute 2    Compliance with Home Oxygen Use Yes      Initial 6 min Walk   Oxygen Used Continuous    Liters per minute 2      Program Oxygen Prescription   Program Oxygen Prescription Continuous    Liters per minute 2      Intervention   Short Term Goals To learn and exhibit compliance with exercise, home and travel O2 prescription;To learn and understand importance of maintaining oxygen saturations>88%;To learn and demonstrate proper use of respiratory medications;To learn and understand importance of monitoring SPO2 with pulse oximeter and demonstrate accurate use of the pulse oximeter.;To learn and demonstrate proper pursed lip breathing techniques or other breathing techniques.     Long  Term Goals Exhibits compliance with exercise, home  and travel O2 prescription;Verbalizes importance of monitoring SPO2 with pulse  oximeter and return demonstration;Exhibits proper breathing techniques, such as pursed lip breathing or other method taught during program session;Maintenance of O2 saturations>88%;Compliance with respiratory medication             Oxygen Re-Evaluation:  Oxygen Re-Evaluation     Row Name 04/28/23 1053             Program Oxygen Prescription   Program Oxygen Prescription Continuous       Liters per minute 2         Home Oxygen   Home Oxygen Device E-Tanks;Home Concentrator       Sleep Oxygen Prescription Continuous       Liters per minute 1       Home Exercise Oxygen Prescription Continuous       Liters per minute 2  Home Resting Oxygen Prescription Continuous       Liters per minute 2       Compliance with Home Oxygen Use Yes         Goals/Expected Outcomes   Short Term Goals To learn and exhibit compliance with exercise, home and travel O2 prescription;To learn and understand importance of maintaining oxygen saturations>88%;To learn and demonstrate proper use of respiratory medications;To learn and understand importance of monitoring SPO2 with pulse oximeter and demonstrate accurate use of the pulse oximeter.;To learn and demonstrate proper pursed lip breathing techniques or other breathing techniques.        Long  Term Goals Exhibits compliance with exercise, home  and travel O2 prescription;Verbalizes importance of monitoring SPO2 with pulse oximeter and return demonstration;Exhibits proper breathing techniques, such as pursed lip breathing or other method taught during program session;Maintenance of O2 saturations>88%;Compliance with respiratory medication       Goals/Expected Outcomes Compliance and understanding of oxygen saturation monitoring and breathing techniques to decrease shortness of breath.                Oxygen Discharge (Final Oxygen Re-Evaluation):  Oxygen Re-Evaluation - 04/28/23 1053       Program Oxygen Prescription   Program Oxygen  Prescription Continuous    Liters per minute 2      Home Oxygen   Home Oxygen Device E-Tanks;Home Concentrator    Sleep Oxygen Prescription Continuous    Liters per minute 1    Home Exercise Oxygen Prescription Continuous    Liters per minute 2    Home Resting Oxygen Prescription Continuous    Liters per minute 2    Compliance with Home Oxygen Use Yes      Goals/Expected Outcomes   Short Term Goals To learn and exhibit compliance with exercise, home and travel O2 prescription;To learn and understand importance of maintaining oxygen saturations>88%;To learn and demonstrate proper use of respiratory medications;To learn and understand importance of monitoring SPO2 with pulse oximeter and demonstrate accurate use of the pulse oximeter.;To learn and demonstrate proper pursed lip breathing techniques or other breathing techniques.     Long  Term Goals Exhibits compliance with exercise, home  and travel O2 prescription;Verbalizes importance of monitoring SPO2 with pulse oximeter and return demonstration;Exhibits proper breathing techniques, such as pursed lip breathing or other method taught during program session;Maintenance of O2 saturations>88%;Compliance with respiratory medication    Goals/Expected Outcomes Compliance and understanding of oxygen saturation monitoring and breathing techniques to decrease shortness of breath.             Initial Exercise Prescription:  Initial Exercise Prescription - 04/24/23 0900       Date of Initial Exercise RX and Referring Provider   Date 04/24/23    Referring Provider [name]    Expected Discharge Date 07/20/23      Oxygen   Oxygen Continuous    Liters 2    Maintain Oxygen Saturation 88% or higher      NuStep   Level 1    SPM 50    Minutes 15      Track   Minutes 15    METs 1.5      Prescription Details   Frequency (times per week) 2    Duration Progress to 30 minutes of continuous aerobic without signs/symptoms of physical distress       Intensity   THRR 40-80% of Max Heartrate 62-124    Ratings of Perceived Exertion 11-13    Perceived Dyspnea  0-4      Progression   Progression Continue to progress workloads to maintain intensity without signs/symptoms of physical distress.      Resistance Training   Training Prescription Yes    Weight red bands    Reps 10-15             Perform Capillary Blood Glucose checks as needed.  Exercise Prescription Changes:   Exercise Prescription Changes     Row Name 05/02/23 0900             Response to Exercise   Blood Pressure (Admit) 118/70       Blood Pressure (Exercise) 140/70       Blood Pressure (Exit) 106/64       Heart Rate (Admit) 109 bpm       Heart Rate (Exercise) 119 bpm       Heart Rate (Exit) 111 bpm       Oxygen Saturation (Admit) 100 %       Oxygen Saturation (Exercise) 100 %       Oxygen Saturation (Exit) 100 %       Rating of Perceived Exertion (Exercise) 13       Perceived Dyspnea (Exercise) 1       Duration Continue with 30 min of aerobic exercise without signs/symptoms of physical distress.       Intensity THRR unchanged         Progression   Progression Continue to progress workloads to maintain intensity without signs/symptoms of physical distress.         Resistance Training   Training Prescription Yes       Weight red bands       Reps 10-15       Time 10 Minutes         Oxygen   Oxygen Continuous       Liters 2         NuStep   Level 1       Minutes 15       METs 2.4         Track   Laps 7.5       Minutes 15       METs 2.23         Oxygen   Maintain Oxygen Saturation 88% or higher                Exercise Comments:   Exercise Goals and Review:   Exercise Goals     Row Name 04/24/23 0936 04/28/23 1052           Exercise Goals   Increase Physical Activity Yes Yes      Intervention Provide advice, education, support and counseling about physical activity/exercise needs.;Develop an individualized  exercise prescription for aerobic and resistive training based on initial evaluation findings, risk stratification, comorbidities and participant's personal goals. Provide advice, education, support and counseling about physical activity/exercise needs.;Develop an individualized exercise prescription for aerobic and resistive training based on initial evaluation findings, risk stratification, comorbidities and participant's personal goals.      Expected Outcomes Short Term: Attend rehab on a regular basis to increase amount of physical activity.;Long Term: Exercising regularly at least 3-5 days a week.;Long Term: Add in home exercise to make exercise part of routine and to increase amount of physical activity. Short Term: Attend rehab on a regular basis to increase amount of physical activity.;Long Term: Exercising regularly at least 3-5 days a week.;Long Term: Add in home exercise  to make exercise part of routine and to increase amount of physical activity.      Increase Strength and Stamina Yes Yes      Intervention Provide advice, education, support and counseling about physical activity/exercise needs.;Develop an individualized exercise prescription for aerobic and resistive training based on initial evaluation findings, risk stratification, comorbidities and participant's personal goals. Provide advice, education, support and counseling about physical activity/exercise needs.;Develop an individualized exercise prescription for aerobic and resistive training based on initial evaluation findings, risk stratification, comorbidities and participant's personal goals.      Expected Outcomes Short Term: Increase workloads from initial exercise prescription for resistance, speed, and METs.;Short Term: Perform resistance training exercises routinely during rehab and add in resistance training at home;Long Term: Improve cardiorespiratory fitness, muscular endurance and strength as measured by increased METs and  functional capacity ( ) Short Term: Increase workloads from initial exercise prescription for resistance, speed, and METs.;Short Term: Perform resistance training exercises routinely during rehab and add in resistance training at home;Long Term: Improve cardiorespiratory fitness, muscular endurance and strength as measured by increased METs and functional capacity ( )      Able to understand and use rate of perceived exertion (RPE) scale Yes Yes      Intervention Provide education and explanation on how to use RPE scale Provide education and explanation on how to use RPE scale      Expected Outcomes Short Term: Able to use RPE daily in rehab to express subjective intensity level;Long Term:  Able to use RPE to guide intensity level when exercising independently Short Term: Able to use RPE daily in rehab to express subjective intensity level;Long Term:  Able to use RPE to guide intensity level when exercising independently      Able to understand and use Dyspnea scale Yes Yes      Intervention Provide education and explanation on how to use Dyspnea scale Provide education and explanation on how to use Dyspnea scale      Expected Outcomes Short Term: Able to use Dyspnea scale daily in rehab to express subjective sense of shortness of breath during exertion;Long Term: Able to use Dyspnea scale to guide intensity level when exercising independently Short Term: Able to use Dyspnea scale daily in rehab to express subjective sense of shortness of breath during exertion;Long Term: Able to use Dyspnea scale to guide intensity level when exercising independently      Knowledge and understanding of Target Heart Rate Range (THRR) Yes Yes      Intervention Provide education and explanation of THRR including how the numbers were predicted and where they are located for reference Provide education and explanation of THRR including how the numbers were predicted and where they are located for reference      Expected  Outcomes Short Term: Able to state/look up THRR;Long Term: Able to use THRR to govern intensity when exercising independently;Short Term: Able to use daily as guideline for intensity in rehab Short Term: Able to state/look up THRR;Long Term: Able to use THRR to govern intensity when exercising independently;Short Term: Able to use daily as guideline for intensity in rehab      Understanding of Exercise Prescription Yes Yes      Intervention Provide education, explanation, and written materials on patient's individual exercise prescription Provide education, explanation, and written materials on patient's individual exercise prescription      Expected Outcomes Short Term: Able to explain program exercise prescription;Long Term: Able to explain home exercise prescription to exercise independently Short Term:  Able to explain program exercise prescription;Long Term: Able to explain home exercise prescription to exercise independently               Exercise Goals Re-Evaluation :  Exercise Goals Re-Evaluation     Row Name 04/28/23 1052             Exercise Goal Re-Evaluation   Exercise Goals Review Increase Physical Activity;Able to understand and use Dyspnea scale;Understanding of Exercise Prescription;Increase Strength and Stamina;Knowledge and understanding of Target Heart Rate Range (THRR);Able to understand and use rate of perceived exertion (RPE) scale       Comments Skip is scheduled to begin exercise next week. Will continue to monitor and progress as able.       Expected Outcomes Through exercise at rehab and home, the patient will decrease shortness of breath with daily activities and feel confident in carrying out an exercise regimen at home.                Discharge Exercise Prescription (Final Exercise Prescription Changes):  Exercise Prescription Changes - 05/02/23 0900       Response to Exercise   Blood Pressure (Admit) 118/70    Blood Pressure (Exercise) 140/70     Blood Pressure (Exit) 106/64    Heart Rate (Admit) 109 bpm    Heart Rate (Exercise) 119 bpm    Heart Rate (Exit) 111 bpm    Oxygen Saturation (Admit) 100 %    Oxygen Saturation (Exercise) 100 %    Oxygen Saturation (Exit) 100 %    Rating of Perceived Exertion (Exercise) 13    Perceived Dyspnea (Exercise) 1    Duration Continue with 30 min of aerobic exercise without signs/symptoms of physical distress.    Intensity THRR unchanged      Progression   Progression Continue to progress workloads to maintain intensity without signs/symptoms of physical distress.      Resistance Training   Training Prescription Yes    Weight red bands    Reps 10-15    Time 10 Minutes      Oxygen   Oxygen Continuous    Liters 2      NuStep   Level 1    Minutes 15    METs 2.4      Track   Laps 7.5    Minutes 15    METs 2.23      Oxygen   Maintain Oxygen Saturation 88% or higher             Nutrition:  Target Goals: Understanding of nutrition guidelines, daily intake of sodium 1500mg , cholesterol 200mg , calories 30% from fat and 7% or less from saturated fats, daily to have 5 or more servings of fruits and vegetables.  Biometrics:  Pre Biometrics - 04/24/23 0914       Pre Biometrics   Grip Strength 29 kg              Nutrition Therapy Plan and Nutrition Goals:  Nutrition Therapy & Goals - 05/02/23 1010       Nutrition Therapy   Diet Heart Healthy Diet    Drug/Food Interactions Statins/Certain Fruits      Personal Nutrition Goals   Nutrition Goal Patient to identify strategies for weight maintenance/ weight gain of 0.5-2.0# per week.    Comments Skip continues regular follow-up for kidney transplant; he has required dialysis since heart transplant in 2021. He currently receives hemodialysis M/W/F. His weight has been stable since hospitalization related to pneumonia  in May 2024, and per diaylsis notes, his typical weight has been ~60kg.  Patient will benefit from  participation in pulmonary rehab for nutrition, exercise, and lifestyle modification.      Intervention Plan   Intervention Prescribe, educate and counsel regarding individualized specific dietary modifications aiming towards targeted core components such as weight, hypertension, lipid management, diabetes, heart failure and other comorbidities.;Nutrition handout(s) given to patient.    Expected Outcomes Long Term Goal: Adherence to prescribed nutrition plan.;Short Term Goal: Understand basic principles of dietary content, such as calories, fat, sodium, cholesterol and nutrients.             Nutrition Assessments:  Nutrition Assessments - 05/02/23 1113       Rate Your Plate Scores   Pre Score 62            MEDIFICTS Score Key: ?70 Need to make dietary changes  40-70 Heart Healthy Diet ? 40 Therapeutic Level Cholesterol Diet  Flowsheet Row PULMONARY REHAB OTHER RESPIRATORY from 05/02/2023 in [facility]  Picture Your Plate Total Score on Admission 62      Picture Your Plate Scores: <16 Unhealthy dietary pattern with much room for improvement. 41-50 Dietary pattern unlikely to meet recommendations for good health and room for improvement. 51-60 More healthful dietary pattern, with some room for improvement.  >60 Healthy dietary pattern, although there may be some specific behaviors that could be improved.    Nutrition Goals Re-Evaluation:  Nutrition Goals Re-Evaluation     Row Name 05/02/23 1010             Goals   Current Weight 134 lb 11.2 oz (61.1 kg)       Comment GFR 11, Cr 5.5, PTH 194, triglycerides 161, HDL 34, Albumin 2.3       Expected Outcome Skip continues regular follow-up for kidney transplant; he has required dialysis since heart transplant in 2021. He currently receives hemodialysis M/W/F. His weight has been stable since hospitalization related to pneumonia in May 2024, and per diaylsis notes,  his typical weight has been ~60kg. Patient will benefit from participation in pulmonary rehab for nutrition, exercise, and lifestyle modification.                Nutrition Goals Discharge (Final Nutrition Goals Re-Evaluation):  Nutrition Goals Re-Evaluation - 05/02/23 1010       Goals   Current Weight 134 lb 11.2 oz (61.1 kg)    Comment GFR 11, Cr 5.5, PTH 194, triglycerides 161, HDL 34, Albumin 2.3    Expected Outcome Skip continues regular follow-up for kidney transplant; he has required dialysis since heart transplant in 2021. He currently receives hemodialysis M/W/F. His weight has been stable since hospitalization related to pneumonia in May 2024, and per diaylsis notes, his typical weight has been ~60kg. Patient will benefit from participation in pulmonary rehab for nutrition, exercise, and lifestyle modification.             Psychosocial: Target Goals: Acknowledge presence or absence of significant depression and/or stress, maximize coping skills, provide positive support system. Participant is able to verbalize types and ability to use techniques and skills needed for reducing stress and depression.  Initial Review & Psychosocial Screening:  Initial Psych Review & Screening - 04/24/23 0923       Initial Review   Current issues with None Identified      Family Dynamics   Good Support System? Yes  Comments sister and other family members      Barriers   Psychosocial barriers to participate in program There are no identifiable barriers or psychosocial needs.      Screening Interventions   Interventions Encouraged to exercise             Quality of Life Scores:  Scores of 19 and below usually indicate a poorer quality of life in these areas.  A difference of  2-3 points is a clinically meaningful difference.  A difference of 2-3 points in the total score of the Quality of Life Index has been associated with significant improvement in overall quality of life,  self-image, physical symptoms, and general health in studies assessing change in quality of life.  PHQ-9: Review Flowsheet       04/24/2023  Depression screen PHQ 2/9  Decreased Interest 0  Down, Depressed, Hopeless 0  PHQ - 2 Score 0  Altered sleeping 0  Tired, decreased energy 0  Change in appetite 0  Feeling bad or failure about yourself  0  Trouble concentrating 0  Moving slowly or fidgety/restless 0  Suicidal thoughts 0  PHQ-9 Score 0  Difficult doing work/chores Not difficult at all   Interpretation of Total Score  Total Score Depression Severity:  1-4 = Minimal depression, 5-9 = Mild depression, 10-14 = Moderate depression, 15-19 = Moderately severe depression, 20-27 = Severe depression   Psychosocial Evaluation and Intervention:  Psychosocial Evaluation - 04/24/23 0923       Psychosocial Evaluation & Interventions   Interventions Encouraged to exercise with the program and follow exercise prescription    Comments Skip denies any psychosocial barriers to exercise.    Expected Outcomes For Skip to participate in PR free of psychosocial barriers.    Continue Psychosocial Services  No Follow up required             Psychosocial Re-Evaluation:  Psychosocial Re-Evaluation     Row Name 04/26/23 1550             Psychosocial Re-Evaluation   Current issues with None Identified       Comments No change in psychosocial assessment since orientation 04/24/2023. No concerns identified       Expected Outcomes For Skip to attend PR without any barriers or concerns       Interventions Encouraged to attend Pulmonary Rehabilitation for the exercise       Continue Psychosocial Services  No Follow up required                Psychosocial Discharge (Final Psychosocial Re-Evaluation):  Psychosocial Re-Evaluation - 04/26/23 1550       Psychosocial Re-Evaluation   Current issues with None Identified    Comments No change in psychosocial assessment since orientation  04/24/2023. No concerns identified    Expected Outcomes For Skip to attend PR without any barriers or concerns    Interventions Encouraged to attend Pulmonary Rehabilitation for the exercise    Continue Psychosocial Services  No Follow up required             Education: Education Goals: Education classes will be provided on a weekly basis, covering required topics. Participant will state understanding/return demonstration of topics presented.  Learning Barriers/Preferences:  Learning Barriers/Preferences - 04/24/23 1914       Learning Barriers/Preferences   Learning Barriers Sight    Learning Preferences Skilled Demonstration             Education Topics: Introduction to Pulmonary Rehab  Group instruction provided by PowerPoint, verbal discussion, and written material to support subject matter. Instructor reviews what Pulmonary Rehab is, the purpose of the program, and how patients are referred.     Know Your Numbers Group instruction that is supported by a PowerPoint presentation. Instructor discusses importance of knowing and understanding resting, exercise, and post-exercise oxygen saturation, heart rate, and blood pressure. Oxygen saturation, heart rate, blood pressure, rating of perceived exertion, and dyspnea are reviewed along with a normal range for these values.    Exercise for the Pulmonary Patient Group instruction that is supported by a PowerPoint presentation. Instructor discusses benefits of exercise, core components of exercise, frequency, duration, and intensity of an exercise routine, importance of utilizing pulse oximetry during exercise, safety while exercising, and options of places to exercise outside of rehab.    MET Level  Group instruction provided by PowerPoint, verbal discussion, and written material to support subject matter. Instructor reviews what METs are and how to increase METs.    Pulmonary Medications Verbally interactive group education  provided by instructor with focus on inhaled medications and proper administration.   Anatomy and Physiology of the Respiratory System Group instruction provided by PowerPoint, verbal discussion, and written material to support subject matter. Instructor reviews respiratory cycle and anatomical components of the respiratory system and their functions. Instructor also reviews differences in obstructive and restrictive respiratory diseases with examples of each.    Oxygen Safety Group instruction provided by PowerPoint, verbal discussion, and written material to support subject matter. There is an overview of "What is Oxygen" and "Why do we need it".  Instructor also reviews how to create a safe environment for oxygen use, the importance of using oxygen as prescribed, and the risks of noncompliance. There is a brief discussion on traveling with oxygen and resources the patient may utilize.   Oxygen Use Group instruction provided by PowerPoint, verbal discussion, and written material to discuss how supplemental oxygen is prescribed and different types of oxygen supply systems. Resources for more information are provided.    Breathing Techniques Group instruction that is supported by demonstration and informational handouts. Instructor discusses the benefits of pursed lip and diaphragmatic breathing and detailed demonstration on how to perform both.     Risk Factor Reduction Group instruction that is supported by a PowerPoint presentation. Instructor discusses the definition of a risk factor, different risk factors for pulmonary disease, and how the heart and lungs work together.   MD Day A group question and answer session with a medical doctor that allows participants to ask questions that relate to their pulmonary disease state.   Nutrition for the Pulmonary Patient Group instruction provided by PowerPoint slides, verbal discussion, and written materials to support subject matter. The  instructor gives an explanation and review of healthy diet recommendations, which includes a discussion on weight management, recommendations for fruit and vegetable consumption, as well as protein, fluid, caffeine, fiber, sodium, sugar, and alcohol. Tips for eating when patients are short of breath are discussed.    Other Education Group or individual verbal, written, or video instructions that support the educational goals of the pulmonary rehab program.    Knowledge Questionnaire Score:  Knowledge Questionnaire Score - 04/24/23 0924       Knowledge Questionnaire Score   Pre Score 18/18             Core Components/Risk Factors/Patient Goals at Admission:  Personal Goals and Risk Factors at Admission - 04/24/23 1610  Core Components/Risk Factors/Patient Goals on Admission    Weight Management Yes;Weight Gain    Intervention Weight Management: Develop a combined nutrition and exercise program designed to reach desired caloric intake, while maintaining appropriate intake of nutrient and fiber, sodium and fats, and appropriate energy expenditure required for the weight goal.;Weight Management: Provide education and appropriate resources to help participant work on and attain dietary goals.    Expected Outcomes Weight Gain: Understanding of general recommendations for a high calorie, high protein meal plan that promotes weight gain by distributing calorie intake throughout the day with the consumption for 4-5 meals, snacks, and/or supplements;Understanding of distribution of calorie intake throughout the day with the consumption of 4-5 meals/snacks;Understanding recommendations for meals to include 15-35% energy as protein, 25-35% energy from fat, 35-60% energy from carbohydrates, less than 200mg  of dietary cholesterol, 20-35 gm of total fiber daily;Short Term: Continue to assess and modify interventions until short term weight is achieved;Long Term: Adherence to nutrition and physical  activity/exercise program aimed toward attainment of established weight goal    Improve shortness of breath with ADL's Yes    Intervention Provide education, individualized exercise plan and daily activity instruction to help decrease symptoms of SOB with activities of daily living.    Expected Outcomes Short Term: Improve cardiorespiratory fitness to achieve a reduction of symptoms when performing ADLs;Long Term: Be able to perform more ADLs without symptoms or delay the onset of symptoms             Core Components/Risk Factors/Patient Goals Review:   Goals and Risk Factor Review     Row Name 04/26/23 1552             Core Components/Risk Factors/Patient Goals Review   Personal Goals Review Weight Management/Obesity;Develop more efficient breathing techniques such as purse lipped breathing and diaphragmatic breathing and practicing self-pacing with activity.;Improve shortness of breath with ADL's       Review Unable to evaluate any progress towards goals yet. Skip is scheduled to begin on 7/9.       Expected Outcomes For Skip to gain weight, improve his shortness of breath with ADLs, and develop more efficient breathing techniques.                Core Components/Risk Factors/Patient Goals at Discharge (Final Review):   Goals and Risk Factor Review - 04/26/23 1552       Core Components/Risk Factors/Patient Goals Review   Personal Goals Review Weight Management/Obesity;Develop more efficient breathing techniques such as purse lipped breathing and diaphragmatic breathing and practicing self-pacing with activity.;Improve shortness of breath with ADL's    Review Unable to evaluate any progress towards goals yet. Skip is scheduled to begin on 7/9.    Expected Outcomes For Skip to gain weight, improve his shortness of breath with ADLs, and develop more efficient breathing techniques.             ITP Comments: Pt is making expected progress toward Pulmonary Rehab goals after  completing 1 session. Recommend continued exercise, life style modification, education, and utilization of breathing techniques to increase stamina and strength, while also decreasing shortness of breath with exertion.     Comments: Dr. Mechele Collin is Medical Director for Pulmonary Rehab at [facility].

## 2023-05-04 ENCOUNTER — Encounter (HOSPITAL_COMMUNITY)
Admission: RE | Admit: 2023-05-04 | Discharge: 2023-05-04 | Disposition: A | Discharge status: Home / Self Care | Payer: Medicare Other | Source: Ambulatory Visit | Attending: Pulmonary Disease | Admitting: Pulmonary Disease

## 2023-05-04 ENCOUNTER — Telehealth: Admit: 2023-05-04 | Discharge: 2023-05-05 | Payer: MEDICARE

## 2023-05-04 DIAGNOSIS — J9600 Acute respiratory failure, unspecified whether with hypoxia or hypercapnia: Secondary | ICD-10-CM

## 2023-05-04 DIAGNOSIS — J984 Other disorders of lung: Principal | ICD-10-CM

## 2023-05-04 DIAGNOSIS — R0902 Hypoxemia: Principal | ICD-10-CM

## 2023-05-04 DIAGNOSIS — Z8709 Personal history of other diseases of the respiratory system: Principal | ICD-10-CM

## 2023-05-04 DIAGNOSIS — Z941 Heart transplant status: Principal | ICD-10-CM

## 2023-05-04 NOTE — Progress Notes (Signed)
Daily Session Note  Patient Details  Name: Rickey Erickson MRN: [id number] Date of Birth: [birth year] Referring Provider:   [name] Pulmonary Rehab Walk Test from 04/24/2023 in [facility] for Heart, Vascular, & Lung Health  Referring Provider [name]       Encounter Date: 05/04/2023  Check In:  Session Check In - 05/04/23 0851       Check-In   Supervising physician immediately available to respond to emergencies [facility] MD immediately available    Physician(s) Micah Flesher, NP    Location [facility]    Staff Present Essie Hart, RN, BSN;Randi Idelle Crouch BS, ACSM-CEP, Exercise Physiologist;Kaylee Earlene Plater, MS, ACSM-CEP, Exercise Physiologist;Jebidiah Baggerly Katrinka Blazing, RT    Virtual Visit No    Medication changes reported     No    Fall or balance concerns reported    No    Tobacco Cessation No Change    Warm-up and Cool-down Performed as group-led instruction    Resistance Training Performed Yes    VAD Patient? No    PAD/SET Patient? No      Pain Assessment   Currently in Pain? No/denies    Multiple Pain Sites No             Capillary Blood Glucose: No results found for this or any previous visit (from the past 24 hour(s)).    Social History   Tobacco Use  Smoking Status Never  Smokeless Tobacco Never    Goals Met:  Proper associated with RPD/PD & O2 Sat Independence with exercise equipment Exercise tolerated well No report of concerns or symptoms today Strength training completed today  Goals Unmet:  Not Applicable  Comments: Service time is from 0805 to 0945.    Dr. Mechele Collin is Medical Director for Pulmonary Rehab at [facility].

## 2023-05-09 ENCOUNTER — Encounter (HOSPITAL_COMMUNITY)
Admission: RE | Admit: 2023-05-09 | Discharge: 2023-05-09 | Disposition: A | Discharge status: Home / Self Care | Payer: Medicare Other | Source: Ambulatory Visit | Attending: Pulmonary Disease | Admitting: Pulmonary Disease

## 2023-05-09 DIAGNOSIS — J9600 Acute respiratory failure, unspecified whether with hypoxia or hypercapnia: Secondary | ICD-10-CM

## 2023-05-09 NOTE — Progress Notes (Signed)
Daily Session Note  Patient Details  Name: Rickey Erickson MRN: [id number] Date of Birth: 25-Sep-1956 Referring Provider:   [name] Pulmonary Rehab Walk Test from 04/24/2023 in [facility] for Heart, Vascular, & Lung Health  Referring Provider [name]       Encounter Date: 05/09/2023  Check In:  Session Check In - 05/09/23 0847       Check-In   Supervising physician immediately available to respond to emergencies [facility] MD immediately available    Physician(s) Edd Fabian NP    [facility]    Staff Present Essie Hart, RN, BSN;Randi Idelle Crouch BS, ACSM-CEP, Exercise Physiologist;Silvestre Mines Earlene Plater, MS, ACSM-CEP, Exercise Physiologist;Casey Katrinka Blazing, RT    Virtual Visit No    Medication changes reported     No    Fall or balance concerns reported    No    Tobacco Cessation No Change    Warm-up and Cool-down Performed as group-led instruction    Resistance Training Performed Yes    VAD Patient? No    PAD/SET Patient? No      Pain Assessment   Currently in Pain? No/denies             Capillary Blood Glucose: No results found for this or any previous visit (from the past 24 hour(s)).    Social History   Tobacco Use  Smoking Status Never  Smokeless Tobacco Never    Goals Met:  Proper associated with RPD/PD & O2 Sat Independence with exercise equipment Exercise tolerated well No report of concerns or symptoms today Strength training completed today  Goals Unmet:  Not Applicable  Comments: Service time is from 0807 to 0926.    Dr. Mechele Collin is Medical Director for Pulmonary Rehab at [facility]

## 2023-05-11 ENCOUNTER — Encounter (HOSPITAL_COMMUNITY)
Admission: RE | Admit: 2023-05-11 | Discharge: 2023-05-11 | Disposition: A | Discharge status: Home / Self Care | Payer: Medicare Other | Source: Ambulatory Visit | Attending: Pulmonary Disease | Admitting: Pulmonary Disease

## 2023-05-11 DIAGNOSIS — J9600 Acute respiratory failure, unspecified whether with hypoxia or hypercapnia: Secondary | ICD-10-CM | POA: Diagnosis not present

## 2023-05-11 NOTE — Progress Notes (Signed)
Daily Session Note  Patient Details  Name: Rickey Erickson MRN: [id number] Date of Birth: 1955-11-27 Referring Provider:   [name] Pulmonary Rehab Walk Test from 04/24/2023 in [facility] for Heart, Vascular, & Lung Health  Referring Provider [name]       Encounter Date: 05/11/2023  Check In:  Session Check In - 05/11/23 0857       Check-In   Supervising physician immediately available to respond to emergencies [facility] MD immediately available    Physician(s) Bernadene Person, NP    [facility]    Staff Present Samantha Belarus, RD, Dutch Gray, RN, BSN;Randi Reeve BS, ACSM-CEP, Exercise Physiologist;Kaylee Earlene Plater, MS, ACSM-CEP, Exercise Physiologist;Murna Backer Katrinka Blazing, RT    Virtual Visit No    Medication changes reported     No    Fall or balance concerns reported    No    Tobacco Cessation No Change    Warm-up and Cool-down Performed as group-led instruction    Resistance Training Performed Yes    VAD Patient? No    PAD/SET Patient? No      Pain Assessment   Currently in Pain? No/denies    Multiple Pain Sites No             Capillary Blood Glucose: No results found for this or any previous visit (from the past 24 hour(s)).    Social History   Tobacco Use  Smoking Status Never  Smokeless Tobacco Never    Goals Met:  Proper associated with RPD/PD & O2 Sat Independence with exercise equipment Exercise tolerated well No report of concerns or symptoms today Strength training completed today  Goals Unmet:  Not Applicable  Comments: Service time is from 0803 to 0940.    Dr. Mechele Collin is Medical Director for Pulmonary Rehab at [facility].

## 2023-05-16 ENCOUNTER — Encounter (HOSPITAL_COMMUNITY)
Admission: RE | Admit: 2023-05-16 | Discharge: 2023-05-16 | Disposition: A | Discharge status: Home / Self Care | Payer: Medicare Other | Source: Ambulatory Visit | Attending: Pulmonary Disease | Admitting: Pulmonary Disease

## 2023-05-16 VITALS — Wt 131.6 lb

## 2023-05-16 DIAGNOSIS — J9600 Acute respiratory failure, unspecified whether with hypoxia or hypercapnia: Secondary | ICD-10-CM

## 2023-05-16 NOTE — Progress Notes (Signed)
Daily Session Note  Patient Details  Name: Rickey Erickson MRN: [id number] Date of Birth: 07-04-1956 Referring Provider:   [name] Pulmonary Rehab Walk Test from 04/24/2023 in [facility] for Heart, Vascular, & Lung Health  Referring Provider [name]       Encounter Date: 05/16/2023  Check In:  Session Check In - 05/16/23 3016       Check-In   Supervising physician immediately available to respond to emergencies [facility] MD immediately available    Physician(s) Bernadene Person, NP    [facility]    Staff Present Essie Hart, RN, Doris Cheadle, MS, ACSM-CEP, Exercise Physiologist    Virtual Visit No    Medication changes reported     No    Fall or balance concerns reported    No    Tobacco Cessation No Change    Warm-up and Cool-down Performed as group-led instruction    Resistance Training Performed Yes    VAD Patient? No    PAD/SET Patient? No      Pain Assessment   Currently in Pain? No/denies    Multiple Pain Sites No             Capillary Blood Glucose: No results found for this or any previous visit (from the past 24 hour(s)).   Exercise Prescription Changes - 05/16/23 0900       Response to Exercise   Blood Pressure (Admit) 124/64    Blood Pressure (Exercise) 142/62    Blood Pressure (Exit) 102/62    Heart Rate (Admit) 118 bpm    Heart Rate (Exercise) 126 bpm    Heart Rate (Exit) 116 bpm    Oxygen Saturation (Admit) 100 %    Oxygen Saturation (Exercise) 100 %    Oxygen Saturation (Exit) 100 %    Rating of Perceived Exertion (Exercise) 13    Perceived Dyspnea (Exercise) 3    Duration Continue with 30 min of aerobic exercise without signs/symptoms of physical distress.    Intensity THRR unchanged      Progression   Progression Continue to progress workloads to maintain intensity without signs/symptoms of physical distress.      Resistance Training   Training Prescription Yes    Weight red bands     Reps 10-15    Time 10 Minutes      Oxygen   Oxygen Continuous    Liters 2      NuStep   Level 3    Minutes 15    METs 2.7      Track   Laps 8    Minutes 15    METs 2.23      Oxygen   Maintain Oxygen Saturation 88% or higher             Social History   Tobacco Use  Smoking Status Never  Smokeless Tobacco Never    Goals Met:  Proper associated with RPD/PD & O2 Sat Independence with exercise equipment Exercise tolerated well No report of concerns or symptoms today Strength training completed today  Goals Unmet:  Not Applicable  Comments: Service time is from 0809 to [phone].    Dr. Mechele Collin is Medical Director for Pulmonary Rehab at [facility].

## 2023-05-18 ENCOUNTER — Encounter (HOSPITAL_COMMUNITY): Payer: Medicare Other

## 2023-05-23 ENCOUNTER — Encounter (HOSPITAL_COMMUNITY)
Admission: RE | Admit: 2023-05-23 | Discharge: 2023-05-23 | Disposition: A | Discharge status: Home / Self Care | Payer: Medicare Other | Source: Ambulatory Visit | Attending: Pulmonary Disease | Admitting: Pulmonary Disease

## 2023-05-23 DIAGNOSIS — J9600 Acute respiratory failure, unspecified whether with hypoxia or hypercapnia: Secondary | ICD-10-CM

## 2023-05-23 NOTE — Progress Notes (Signed)
Daily Session Note  Patient Details  Name: Rickey Erickson MRN: [id number] Date of Birth: 02/27/1956 Referring Provider:   [name] Pulmonary Rehab Walk Test from 04/24/2023 in [facility] for Heart, Vascular, & Lung Health  Referring Provider [name]       Encounter Date: 05/23/2023  Check In:  Session Check In - 05/23/23 9563       Check-In   Supervising physician immediately available to respond to emergencies [facility] MD immediately available    Physician(s) Robin Searing, NP    [facility]    Staff Present Essie Hart, RN, BSN;Randi Idelle Crouch BS, ACSM-CEP, Exercise Physiologist;Sheniya Garciaperez Katrinka Blazing, RT    Virtual Visit No    Medication changes reported     No    Fall or balance concerns reported    No    Tobacco Cessation No Change    Warm-up and Cool-down Performed as group-led instruction    Resistance Training Performed Yes    VAD Patient? No    PAD/SET Patient? No      Pain Assessment   Currently in Pain? No/denies    Multiple Pain Sites No             Capillary Blood Glucose: No results found for this or any previous visit (from the past 24 hour(s)).    Social History   Tobacco Use  Smoking Status Never  Smokeless Tobacco Never    Goals Met:  Proper associated with RPD/PD & O2 Sat Independence with exercise equipment Exercise tolerated well No report of concerns or symptoms today Strength training completed today  Goals Unmet:  Not Applicable  Comments: Service time is from 0806 to 0930.    Dr. Mechele Collin is Medical Director for Pulmonary Rehab at [facility].

## 2023-05-25 ENCOUNTER — Encounter (HOSPITAL_COMMUNITY)
Admission: RE | Admit: 2023-05-25 | Discharge: 2023-05-25 | Disposition: A | Discharge status: Home / Self Care | Payer: Medicare Other | Source: Ambulatory Visit | Attending: Pulmonary Disease | Admitting: Pulmonary Disease

## 2023-05-25 DIAGNOSIS — J9600 Acute respiratory failure, unspecified whether with hypoxia or hypercapnia: Secondary | ICD-10-CM | POA: Insufficient documentation

## 2023-05-25 NOTE — Progress Notes (Signed)
Daily Session Note  Patient Details  Name: Rickey Erickson MRN: [id number] Date of Birth: 1956/05/22 Referring Provider:   [name] Pulmonary Rehab Walk Test from 04/24/2023 in [facility] for Heart, Vascular, & Lung Health  Referring Provider [name]       Encounter Date: 05/25/2023  Check In:  Session Check In - 05/25/23 0865       Check-In   Supervising physician immediately available to respond to emergencies [facility] MD immediately available    Physician(s) Carlyon Shadow, NP    [facility]    Staff Present Essie Hart, RN, BSN;Randi Idelle Crouch BS, ACSM-CEP, Exercise Physiologist;Rmani Kapusta Hermine Messick Belarus, RD, LDN    Virtual Visit No    Medication changes reported     No    Fall or balance concerns reported    No    Tobacco Cessation No Change    Warm-up and Cool-down Performed as group-led instruction    Resistance Training Performed Yes    VAD Patient? No    PAD/SET Patient? No      Pain Assessment   Currently in Pain? No/denies             Capillary Blood Glucose: No results found for this or any previous visit (from the past 24 hour(s)).    Social History   Tobacco Use  Smoking Status Never  Smokeless Tobacco Never    Goals Met:  Proper associated with RPD/PD & O2 Sat Independence with exercise equipment Exercise tolerated well No report of concerns or symptoms today Strength training completed today  Goals Unmet:  Not Applicable  Comments: Service time is from 0803 to 0939.    Dr. Mechele Collin is Medical Director for Pulmonary Rehab at [facility].

## 2023-05-27 MED ORDER — CLOPIDOGREL 75 MG TABLET
ORAL_TABLET | Freq: Every day | ORAL | 3 refills | 0 days
Start: 2023-05-27 — End: ?

## 2023-05-29 MED ORDER — CLOPIDOGREL 75 MG TABLET
ORAL_TABLET | Freq: Every day | ORAL | 3 refills | 90 days | Status: CP
Start: 2023-05-29 — End: ?

## 2023-05-30 ENCOUNTER — Encounter (HOSPITAL_COMMUNITY)
Admission: RE | Admit: 2023-05-30 | Discharge: 2023-05-30 | Disposition: A | Discharge status: Home / Self Care | Payer: Medicare Other | Source: Ambulatory Visit | Attending: Pulmonary Disease | Admitting: Pulmonary Disease

## 2023-05-30 VITALS — Wt 132.1 lb

## 2023-05-30 DIAGNOSIS — J9600 Acute respiratory failure, unspecified whether with hypoxia or hypercapnia: Secondary | ICD-10-CM | POA: Diagnosis not present

## 2023-05-31 NOTE — Progress Notes (Signed)
Pulmonary Individual Treatment Plan  Patient Details  Name: Rickey Erickson MRN: [id number] Date of Birth: 12-15-1955 Referring Provider:   [name] Pulmonary Rehab Walk Test from 04/24/2023 in [facility]  Referring Provider [name]       Initial Encounter Date:  Flowsheet Row Pulmonary Rehab Walk Test from 04/24/2023 in [facility]  Date 04/24/23       Visit Diagnosis: Acute respiratory failure, unspecified whether with hypoxia or hypercapnia (HCC)  Patient's Home Medications on Admission:   Current Outpatient Medications:    acetaminophen (TYLENOL) 500 MG tablet, Take 500 mg by mouth every 6 (six) hours as needed for headache (pain)., Disp: , Rfl:    amLODipine (NORVASC) 10 MG tablet, Take by mouth., Disp: , Rfl:    atorvastatin (LIPITOR) 20 MG tablet, Take 20 mg by mouth every morning., Disp: , Rfl:    calcium carbonate (OSCAL) 1500 (600 Ca) MG TABS tablet, Take 1,500 mg by mouth every morning., Disp: , Rfl:    clopidogrel (PLAVIX) 75 MG tablet, Take 75 mg by mouth every morning., Disp: , Rfl:    diltiazem (CARDIZEM CD) 180 MG 24 hr capsule, Take 180 mg by mouth every evening. (Patient not taking: Reported on 04/24/2023), Disp: , Rfl:    mirtazapine (REMERON) 45 MG tablet, Take 45 mg by mouth every evening., Disp: , Rfl:    mycophenolate (MYFORTIC) 180 MG EC tablet, Take 180 mg by mouth 2 (two) times daily., Disp: , Rfl:    pantoprazole (PROTONIX) 40 MG tablet, Take 40 mg by mouth every morning., Disp: , Rfl:    tacrolimus (PROGRAF) 1 MG capsule, Take 1 mg by mouth 2 (two) times daily. Take with a 5 mg capsule for a total dose of 6 mg twice daily, Disp: , Rfl:    tacrolimus (PROGRAF) 5 MG capsule, Take 5 mg by mouth 2 (two) times daily. Take with a 1 mg capsule for a total dose of 6 mg twice daily, Disp: , Rfl:    torsemide (DEMADEX) 100 MG tablet, Take 100 mg by  mouth See admin instructions. Take one tablet (100 mg) by mouth on Sunday, Tuesday, Thursday, Saturday mornings (non-dialysis days) (Patient not taking: Reported on 04/24/2023), Disp: , Rfl:   Past Medical History: Past Medical History:  Diagnosis Date   Anemia    Heart failure (HCC)    Hypertension    Osteoporosis    Renal disorder     Tobacco Use: Social History   Tobacco Use  Smoking Status Never  Smokeless Tobacco Never    Labs: Review Flowsheet       Latest Ref Rng & Units 01/06/2022 09/08/2022 03/07/2023  Labs for ITP Cardiac and Pulmonary Rehab  Cholestrol 0 - 200 mg/dL 518  841  -  LDL (calc) 0 - 99 mg/dL 90  80  -  HDL-C >66 mg/dL 30  39  -  Trlycerides <150 mg/dL 90  86  -  Hemoglobin A6T 4.8 - 5.6 % 4.9  - -  PH, Arterial 7.35 - 7.45 - - 7.497   PCO2 arterial 32 - 48 mmHg - - 33.6   Bicarbonate 20.0 - 28.0 mmol/L - - 26.0   TCO2 22 - 32 mmol/L - - 27   O2 Saturation % - - 96     Details            Capillary Blood Glucose: No  results found for: "GLUCAP"   Pulmonary Assessment Scores:  Pulmonary Assessment Scores     Row Name 04/24/23 0935         ADL UCSD   ADL Phase Entry     SOB Score total 50       CAT Score   CAT Score 16       mMRC Score   mMRC Score 3             UCSD: Self-administered rating of dyspnea associated with activities of daily living (ADLs) 6-point scale (0 = "not at all" to 5 = "maximal or unable to do because of breathlessness")  Scoring Scores range from 0 to 120.  Minimally important difference is 5 units  CAT: CAT can identify the health impairment of COPD patients and is better correlated with disease progression.  CAT has a scoring range of zero to 40. The CAT score is classified into four groups of low (less than 10), medium (10 - 20), high (21-30) and very high (31-40) based on the impact level of disease on health status. A CAT score over 10 suggests significant symptoms.  A worsening CAT score could be  explained by an exacerbation, poor medication adherence, poor inhaler technique, or progression of COPD or comorbid conditions.  CAT MCID is 2 points  mMRC: mMRC (Modified Medical Research Council) Dyspnea Scale is used to assess the degree of baseline functional disability in patients of respiratory disease due to dyspnea. No minimal important difference is established. A decrease in score of 1 point or greater is considered a positive change.   Pulmonary Function Assessment:  Pulmonary Function Assessment - 04/24/23 0933       Breath   Bilateral Breath Sounds Decreased    Shortness of Breath Yes;Limiting activity             Exercise Target Goals: Exercise Program Goal: Individual exercise prescription set using results from initial 6 min walk test and THRR while considering  patient's activity barriers and safety.   Exercise Prescription Goal: Initial exercise prescription builds to 30-45 minutes a day of aerobic activity, 2-3 days per week.  Home exercise guidelines will be given to patient during program as part of exercise prescription that the participant will acknowledge.  Activity Barriers & Risk Stratification:  Activity Barriers & Cardiac Risk Stratification - 04/24/23 0936       Activity Barriers & Cardiac Risk Stratification   Activity Barriers Balance Concerns;Shortness of Breath;Muscular Weakness;Deconditioning   Rt arm herograft            6 Minute Walk:  6 Minute Walk     Row Name 04/24/23 1112         6 Minute Walk   Phase Initial     Distance 1015 feet     Walk Time 6 minutes     # of Rest Breaks 0     MPH 1.92     METS 3.36     RPE 11     Perceived Dyspnea  3     VO2 Peak 11.75     Symptoms No     Resting HR 110 bpm     Resting BP 120/62     Resting Oxygen Saturation  98 %     Exercise Oxygen Saturation  during 6 min walk 95 %     Max Ex. HR 124 bpm     Max Ex. BP 132/64     2 Minute Post BP 122/64  Interval HR   1 Minute HR  114     2 Minute HR 118     3 Minute HR 121     4 Minute HR 123     5 Minute HR 123     6 Minute HR 124     Interval Heart Rate? Yes       Interval Oxygen   Interval Oxygen? Yes     Baseline Oxygen Saturation % 98 %     1 Minute Oxygen Saturation % 99 %     1 Minute Liters of Oxygen 2 L     2 Minute Oxygen Saturation % 97 %     2 Minute Liters of Oxygen 2 L     3 Minute Oxygen Saturation % 95 %     3 Minute Liters of Oxygen 2 L     4 Minute Oxygen Saturation % 97 %     4 Minute Liters of Oxygen 2 L     5 Minute Oxygen Saturation % 95 %     5 Minute Liters of Oxygen 2 L     6 Minute Oxygen Saturation % 96 %     6 Minute Liters of Oxygen 2 L     2 Minute Post Oxygen Saturation % 100 %     2 Minute Post Liters of Oxygen 2 L              Oxygen Initial Assessment:  Oxygen Initial Assessment - 04/24/23 0932       Home Oxygen   Home Oxygen Device E-Tanks;Home Concentrator    Sleep Oxygen Prescription Continuous    Liters per minute 1    Home Exercise Oxygen Prescription Continuous    Liters per minute 2    Home Resting Oxygen Prescription Continuous    Liters per minute 2    Compliance with Home Oxygen Use Yes      Initial 6 min Walk   Oxygen Used Continuous    Liters per minute 2      Program Oxygen Prescription   Program Oxygen Prescription Continuous    Liters per minute 2      Intervention   Short Term Goals To learn and exhibit compliance with exercise, home and travel O2 prescription;To learn and understand importance of maintaining oxygen saturations>88%;To learn and demonstrate proper use of respiratory medications;To learn and understand importance of monitoring SPO2 with pulse oximeter and demonstrate accurate use of the pulse oximeter.;To learn and demonstrate proper pursed lip breathing techniques or other breathing techniques.     Long  Term Goals Exhibits compliance with exercise, home  and travel O2 prescription;Verbalizes importance of monitoring SPO2  with pulse oximeter and return demonstration;Exhibits proper breathing techniques, such as pursed lip breathing or other method taught during program session;Maintenance of O2 saturations>88%;Compliance with respiratory medication             Oxygen Re-Evaluation:  Oxygen Re-Evaluation     Row Name 04/28/23 1053 05/19/23 1043           Program Oxygen Prescription   Program Oxygen Prescription Continuous Continuous      Liters per minute 2 2        Home Oxygen   Home Oxygen Device E-Tanks;Home Concentrator E-Tanks;Home Concentrator      Sleep Oxygen Prescription Continuous Continuous      Liters per minute 1 1      Home Exercise Oxygen Prescription Continuous Continuous      Liters  per minute 2 2      Home Resting Oxygen Prescription Continuous Continuous      Liters per minute 2 2      Compliance with Home Oxygen Use Yes Yes        Goals/Expected Outcomes   Short Term Goals To learn and exhibit compliance with exercise, home and travel O2 prescription;To learn and understand importance of maintaining oxygen saturations>88%;To learn and demonstrate proper use of respiratory medications;To learn and understand importance of monitoring SPO2 with pulse oximeter and demonstrate accurate use of the pulse oximeter.;To learn and demonstrate proper pursed lip breathing techniques or other breathing techniques.  To learn and exhibit compliance with exercise, home and travel O2 prescription;To learn and understand importance of maintaining oxygen saturations>88%;To learn and demonstrate proper use of respiratory medications;To learn and understand importance of monitoring SPO2 with pulse oximeter and demonstrate accurate use of the pulse oximeter.;To learn and demonstrate proper pursed lip breathing techniques or other breathing techniques.       Long  Term Goals Exhibits compliance with exercise, home  and travel O2 prescription;Verbalizes importance of monitoring SPO2 with pulse oximeter and  return demonstration;Exhibits proper breathing techniques, such as pursed lip breathing or other method taught during program session;Maintenance of O2 saturations>88%;Compliance with respiratory medication Exhibits compliance with exercise, home  and travel O2 prescription;Verbalizes importance of monitoring SPO2 with pulse oximeter and return demonstration;Exhibits proper breathing techniques, such as pursed lip breathing or other method taught during program session;Maintenance of O2 saturations>88%;Compliance with respiratory medication      Goals/Expected Outcomes Compliance and understanding of oxygen saturation monitoring and breathing techniques to decrease shortness of breath. Compliance and understanding of oxygen saturation monitoring and breathing techniques to decrease shortness of breath.               Oxygen Discharge (Final Oxygen Re-Evaluation):  Oxygen Re-Evaluation - 05/19/23 1043       Program Oxygen Prescription   Program Oxygen Prescription Continuous    Liters per minute 2      Home Oxygen   Home Oxygen Device E-Tanks;Home Concentrator    Sleep Oxygen Prescription Continuous    Liters per minute 1    Home Exercise Oxygen Prescription Continuous    Liters per minute 2    Home Resting Oxygen Prescription Continuous    Liters per minute 2    Compliance with Home Oxygen Use Yes      Goals/Expected Outcomes   Short Term Goals To learn and exhibit compliance with exercise, home and travel O2 prescription;To learn and understand importance of maintaining oxygen saturations>88%;To learn and demonstrate proper use of respiratory medications;To learn and understand importance of monitoring SPO2 with pulse oximeter and demonstrate accurate use of the pulse oximeter.;To learn and demonstrate proper pursed lip breathing techniques or other breathing techniques.     Long  Term Goals Exhibits compliance with exercise, home  and travel O2 prescription;Verbalizes importance of  monitoring SPO2 with pulse oximeter and return demonstration;Exhibits proper breathing techniques, such as pursed lip breathing or other method taught during program session;Maintenance of O2 saturations>88%;Compliance with respiratory medication    Goals/Expected Outcomes Compliance and understanding of oxygen saturation monitoring and breathing techniques to decrease shortness of breath.             Initial Exercise Prescription:  Initial Exercise Prescription - 04/24/23 0900       Date of Initial Exercise RX and Referring Provider   Date 04/24/23    Referring Provider [name]  Expected Discharge Date 07/20/23      Oxygen   Oxygen Continuous    Liters 2    Maintain Oxygen Saturation 88% or higher      NuStep   Level 1    SPM 50    Minutes 15      Track   Minutes 15    METs 1.5      Prescription Details   Frequency (times per week) 2    Duration Progress to 30 minutes of continuous aerobic without signs/symptoms of physical distress      Intensity   THRR 40-80% of Max Heartrate 62-124    Ratings of Perceived Exertion 11-13    Perceived Dyspnea 0-4      Progression   Progression Continue to progress workloads to maintain intensity without signs/symptoms of physical distress.      Resistance Training   Training Prescription Yes    Weight red bands    Reps 10-15             Perform Capillary Blood Glucose checks as needed.  Exercise Prescription Changes:   Exercise Prescription Changes     Row Name 05/02/23 0900 05/16/23 0900 05/30/23 0900         Response to Exercise   Blood Pressure (Admit) 118/70 124/64 120/64     Blood Pressure (Exercise) 140/70 142/62 160/72     Blood Pressure (Exit) 106/64 102/62 118/62     Heart Rate (Admit) 109 bpm 118 bpm 111 bpm     Heart Rate (Exercise) 119 bpm 126 bpm 124 bpm     Heart Rate (Exit) 111 bpm 116 bpm 117 bpm     Oxygen Saturation (Admit) 100 % 100 % 100 %     Oxygen Saturation (Exercise) 100 % 100 % 100  %     Oxygen Saturation (Exit) 100 % 100 % 100 %     Rating of Perceived Exertion (Exercise) 13 13 15      Perceived Dyspnea (Exercise) 1 3 3      Duration Continue with 30 min of aerobic exercise without signs/symptoms of physical distress. Continue with 30 min of aerobic exercise without signs/symptoms of physical distress. Continue with 30 min of aerobic exercise without signs/symptoms of physical distress.     Intensity THRR unchanged THRR unchanged THRR unchanged       Progression   Progression Continue to progress workloads to maintain intensity without signs/symptoms of physical distress. Continue to progress workloads to maintain intensity without signs/symptoms of physical distress. Continue to progress workloads to maintain intensity without signs/symptoms of physical distress.       Resistance Training   Training Prescription Yes Yes Yes     Weight red bands red bands red bands     Reps 10-15 10-15 10-15     Time 10 Minutes 10 Minutes 10 Minutes       Oxygen   Oxygen Continuous Continuous --  Weaned to room air     Liters 2 2 --       NuStep   Level 1 3 3      SPM -- -- 80     Minutes 15 15 15      METs 2.4 2.7 3.1       Track   Laps 7.5 8 5.5     Minutes 15 15 15      METs 2.23 2.23 1.85       Oxygen   Maintain Oxygen Saturation 88% or higher 88% or higher 88% or  higher              Exercise Comments:   Exercise Goals and Review:   Exercise Goals     Row Name 04/24/23 1610 04/28/23 1052 05/19/23 1039         Exercise Goals   Increase Physical Activity Yes Yes Yes     Intervention Provide advice, education, support and counseling about physical activity/exercise needs.;Develop an individualized exercise prescription for aerobic and resistive training based on initial evaluation findings, risk stratification, comorbidities and participant's personal goals. Provide advice, education, support and counseling about physical activity/exercise needs.;Develop an  individualized exercise prescription for aerobic and resistive training based on initial evaluation findings, risk stratification, comorbidities and participant's personal goals. Provide advice, education, support and counseling about physical activity/exercise needs.;Develop an individualized exercise prescription for aerobic and resistive training based on initial evaluation findings, risk stratification, comorbidities and participant's personal goals.     Expected Outcomes Short Term: Attend rehab on a regular basis to increase amount of physical activity.;Long Term: Exercising regularly at least 3-5 days a week.;Long Term: Add in home exercise to make exercise part of routine and to increase amount of physical activity. Short Term: Attend rehab on a regular basis to increase amount of physical activity.;Long Term: Exercising regularly at least 3-5 days a week.;Long Term: Add in home exercise to make exercise part of routine and to increase amount of physical activity. Short Term: Attend rehab on a regular basis to increase amount of physical activity.;Long Term: Exercising regularly at least 3-5 days a week.;Long Term: Add in home exercise to make exercise part of routine and to increase amount of physical activity.     Increase Strength and Stamina Yes Yes Yes     Intervention Provide advice, education, support and counseling about physical activity/exercise needs.;Develop an individualized exercise prescription for aerobic and resistive training based on initial evaluation findings, risk stratification, comorbidities and participant's personal goals. Provide advice, education, support and counseling about physical activity/exercise needs.;Develop an individualized exercise prescription for aerobic and resistive training based on initial evaluation findings, risk stratification, comorbidities and participant's personal goals. Provide advice, education, support and counseling about physical activity/exercise  needs.;Develop an individualized exercise prescription for aerobic and resistive training based on initial evaluation findings, risk stratification, comorbidities and participant's personal goals.     Expected Outcomes Short Term: Increase workloads from initial exercise prescription for resistance, speed, and METs.;Short Term: Perform resistance training exercises routinely during rehab and add in resistance training at home;Long Term: Improve cardiorespiratory fitness, muscular endurance and strength as measured by increased METs and functional capacity ( ) Short Term: Increase workloads from initial exercise prescription for resistance, speed, and METs.;Short Term: Perform resistance training exercises routinely during rehab and add in resistance training at home;Long Term: Improve cardiorespiratory fitness, muscular endurance and strength as measured by increased METs and functional capacity ( ) Short Term: Increase workloads from initial exercise prescription for resistance, speed, and METs.;Short Term: Perform resistance training exercises routinely during rehab and add in resistance training at home;Long Term: Improve cardiorespiratory fitness, muscular endurance and strength as measured by increased METs and functional capacity ( )     Able to understand and use rate of perceived exertion (RPE) scale Yes Yes Yes     Intervention Provide education and explanation on how to use RPE scale Provide education and explanation on how to use RPE scale Provide education and explanation on how to use RPE scale     Expected Outcomes Short Term: Able to use RPE  daily in rehab to express subjective intensity level;Long Term:  Able to use RPE to guide intensity level when exercising independently Short Term: Able to use RPE daily in rehab to express subjective intensity level;Long Term:  Able to use RPE to guide intensity level when exercising independently Short Term: Able to use RPE daily in rehab to express  subjective intensity level;Long Term:  Able to use RPE to guide intensity level when exercising independently     Able to understand and use Dyspnea scale Yes Yes Yes     Intervention Provide education and explanation on how to use Dyspnea scale Provide education and explanation on how to use Dyspnea scale Provide education and explanation on how to use Dyspnea scale     Expected Outcomes Short Term: Able to use Dyspnea scale daily in rehab to express subjective sense of shortness of breath during exertion;Long Term: Able to use Dyspnea scale to guide intensity level when exercising independently Short Term: Able to use Dyspnea scale daily in rehab to express subjective sense of shortness of breath during exertion;Long Term: Able to use Dyspnea scale to guide intensity level when exercising independently Short Term: Able to use Dyspnea scale daily in rehab to express subjective sense of shortness of breath during exertion;Long Term: Able to use Dyspnea scale to guide intensity level when exercising independently     Knowledge and understanding of Target Heart Rate Range (THRR) Yes Yes Yes     Intervention Provide education and explanation of THRR including how the numbers were predicted and where they are located for reference Provide education and explanation of THRR including how the numbers were predicted and where they are located for reference Provide education and explanation of THRR including how the numbers were predicted and where they are located for reference     Expected Outcomes Short Term: Able to state/look up THRR;Long Term: Able to use THRR to govern intensity when exercising independently;Short Term: Able to use daily as guideline for intensity in rehab Short Term: Able to state/look up THRR;Long Term: Able to use THRR to govern intensity when exercising independently;Short Term: Able to use daily as guideline for intensity in rehab Short Term: Able to state/look up THRR;Long Term: Able to  use THRR to govern intensity when exercising independently;Short Term: Able to use daily as guideline for intensity in rehab     Understanding of Exercise Prescription Yes Yes Yes     Intervention Provide education, explanation, and written materials on patient's individual exercise prescription Provide education, explanation, and written materials on patient's individual exercise prescription Provide education, explanation, and written materials on patient's individual exercise prescription     Expected Outcomes Short Term: Able to explain program exercise prescription;Long Term: Able to explain home exercise prescription to exercise independently Short Term: Able to explain program exercise prescription;Long Term: Able to explain home exercise prescription to exercise independently Short Term: Able to explain program exercise prescription;Long Term: Able to explain home exercise prescription to exercise independently              Exercise Goals Re-Evaluation :  Exercise Goals Re-Evaluation     Row Name 04/28/23 1052 05/19/23 1039           Exercise Goal Re-Evaluation   Exercise Goals Review Increase Physical Activity;Able to understand and use Dyspnea scale;Understanding of Exercise Prescription;Increase Strength and Stamina;Knowledge and understanding of Target Heart Rate Range (THRR);Able to understand and use rate of perceived exertion (RPE) scale Increase Physical Activity;Able to understand and use  Dyspnea scale;Understanding of Exercise Prescription;Increase Strength and Stamina;Knowledge and understanding of Target Heart Rate Range (THRR);Able to understand and use rate of perceived exertion (RPE) scale      Comments Skip is scheduled to begin exercise next week. Will continue to monitor and progress as able. Skip has completed 5 exercise sessions. He exercises for 15 min on the Nustep and track. Pt averages 2.7 METs at level 3 on the Nustep and 2.23 METs on the track. Skip performs the  warmup and cooldown standing/ seated dependent on his shortness of breath. He has increased his workload for the Nustep as METs have slightly increased. It is too soon to notate any major progressions. Will continue to monitor and progress as able.      Expected Outcomes Through exercise at rehab and home, the patient will decrease shortness of breath with daily activities and feel confident in carrying out an exercise regimen at home. Through exercise at rehab and home, the patient will decrease shortness of breath with daily activities and feel confident in carrying out an exercise regimen at home.               Discharge Exercise Prescription (Final Exercise Prescription Changes):  Exercise Prescription Changes - 05/30/23 0900       Response to Exercise   Blood Pressure (Admit) 120/64    Blood Pressure (Exercise) 160/72    Blood Pressure (Exit) 118/62    Heart Rate (Admit) 111 bpm    Heart Rate (Exercise) 124 bpm    Heart Rate (Exit) 117 bpm    Oxygen Saturation (Admit) 100 %    Oxygen Saturation (Exercise) 100 %    Oxygen Saturation (Exit) 100 %    Rating of Perceived Exertion (Exercise) 15    Perceived Dyspnea (Exercise) 3    Duration Continue with 30 min of aerobic exercise without signs/symptoms of physical distress.    Intensity THRR unchanged      Progression   Progression Continue to progress workloads to maintain intensity without signs/symptoms of physical distress.      Resistance Training   Training Prescription Yes    Weight red bands    Reps 10-15    Time 10 Minutes      Oxygen   Oxygen --   Weaned to room air     NuStep   Level 3    SPM 80    Minutes 15    METs 3.1      Track   Laps 5.5    Minutes 15    METs 1.85      Oxygen   Maintain Oxygen Saturation 88% or higher             Nutrition:  Target Goals: Understanding of nutrition guidelines, daily intake of sodium 1500mg , cholesterol 200mg , calories 30% from fat and 7% or less from  saturated fats, daily to have 5 or more servings of fruits and vegetables.  Biometrics:  Pre Biometrics - 04/24/23 0914       Pre Biometrics   Grip Strength 29 kg              Nutrition Therapy Plan and Nutrition Goals:  Nutrition Therapy & Goals - 05/30/23 1106       Nutrition Therapy   Diet Heart Healthy Diet    Drug/Food Interactions Statins/Certain Fruits      Personal Nutrition Goals   Nutrition Goal Patient to identify strategies for weight maintenance/ weight gain of 0.5-2.0# per week.  goal in progress.   Comments Goals in progress. Skip continues regular follow-up for kidney transplant; he has required dialysis since heart transplant in 2021. He currently receives hemodialysis M/W/F. His weight has been stable since hospitalization related to pneumonia in May 2024, and per diaylsis notes, his typical weight has been ~60kg. He is down about 4.6# since orientation to our program (start weight was 61.1kg). He lives with his sister who is a great support and does all of the grocery shopping and cooking. Skip reports drinking 1 Boost Breeze daily (250kcals, 9g protein) and 1 Chick[employer]fil[employer] Milkshake (580kcals, 13g protein) daily to aid with weight gain/weight maintenance. We have discussed multiple strategies for weight gain including increasing eating frequency, protein supplements, increasing calories from fat/protein/carbohydrates, etc. Patient will benefit from participation in pulmonary rehab for nutrition, exercise, and lifestyle modification.      Intervention Plan   Intervention Prescribe, educate and counsel regarding individualized specific dietary modifications aiming towards targeted core components such as weight, hypertension, lipid management, diabetes, heart failure and other comorbidities.;Nutrition handout(s) given to patient.    Expected Outcomes Long Term Goal: Adherence to prescribed nutrition plan.;Short Term Goal: Understand basic principles of dietary content,  such as calories, fat, sodium, cholesterol and nutrients.             Nutrition Assessments:  Nutrition Assessments - 05/02/23 1113       Rate Your Plate Scores   Pre Score 62            MEDIFICTS Score Key: ?70 Need to make dietary changes  40-70 Heart Healthy Diet ? 40 Therapeutic Level Cholesterol Diet  Flowsheet Row PULMONARY REHAB OTHER RESPIRATORY from 05/02/2023 in [facility] for Heart, Vascular, & Lung Health  Picture Your Plate Total Score on Admission 62      Picture Your Plate Scores: <53 Unhealthy dietary pattern with much room for improvement. 41-50 Dietary pattern unlikely to meet recommendations for good health and room for improvement. 51-60 More healthful dietary pattern, with some room for improvement.  >60 Healthy dietary pattern, although there may be some specific behaviors that could be improved.    Nutrition Goals Re-Evaluation:  Nutrition Goals Re-Evaluation     Row Name 05/02/23 1010 05/30/23 1106           Goals   Current Weight 134 lb 11.2 oz (61.1 kg) 132 lb 0.9 oz (59.9 kg)      Comment GFR 11, Cr 5.5, PTH 194, triglycerides 161, HDL 34, Albumin 2.3 he continues regular follow-up with [facility] healthcare s/p heart transplant. Other most recent labs  GFR 11, Cr 5.5, PTH 194, triglycerides 161, HDL 34, Albumin 2.3      Expected Outcome Skip continues regular follow-up for kidney transplant; he has required dialysis since heart transplant in 2021. He currently receives hemodialysis M/W/F. His weight has been stable since hospitalization related to pneumonia in May 2024, and per diaylsis notes, his typical weight has been ~60kg. He lives with his sister who is a great support and does all of the grocery shopping and cooking. Skip reports drinking 1 Boost Breeze daily (250kcals, 9g protein) and 1 Chick-fil-[employer] Milkshake (580kcals, 13g protein) daily to aid with weight gain/weight maintenance. Patient will benefit from  participation in pulmonary rehab for nutrition, exercise, and lifestyle modification. Goals in progress. Skip continues regular follow-up for kidney transplant; he has required dialysis since heart transplant in 2021. He currently receives hemodialysis M/W/F. His weight has been stable since hospitalization related  to pneumonia in May 2024, and per diaylsis notes, his typical weight has been ~60kg. He is down about 4.6# since orientation to our program (start weight was 61.1kg). He lives with his sister who is a great support and does all of the grocery shopping and cooking. Skip reports drinking 1 Boost Breeze daily (250kcals, 9g protein) and 1 Chick-fil-a Milkshake (580kcals, 13g protein) daily to aid with weight gain/weight maintenance. We have discussed multiple strategies for weight gain including increasing eating frequency, protein supplements, increasing calories from fat/protein/carbohydrates, etc. Patient will benefit from participation in pulmonary rehab for nutrition, exercise, and lifestyle modification.               Nutrition Goals Discharge (Final Nutrition Goals Re-Evaluation):  Nutrition Goals Re-Evaluation - 05/30/23 1106       Goals   Current Weight 132 lb 0.9 oz (59.9 kg)    Comment he continues regular follow-up with [facility] healthcare s/p heart transplant. Other most recent labs  GFR 11, Cr 5.5, PTH 194, triglycerides 161, HDL 34, Albumin 2.3    Expected Outcome Goals in progress. Skip continues regular follow-up for kidney transplant; he has required dialysis since heart transplant in 2021. He currently receives hemodialysis M/W/F. His weight has been stable since hospitalization related to pneumonia in May 2024, and per diaylsis notes, his typical weight has been ~60kg. He is down about 4.6# since orientation to our program (start weight was 61.1kg). He lives with his sister who is a great support and does all of the grocery shopping and cooking. Skip reports drinking 1 Boost  Breeze daily (250kcals, 9g protein) and 1 Chick-fil-a Milkshake (580kcals, 13g protein) daily to aid with weight gain/weight maintenance. We have discussed multiple strategies for weight gain including increasing eating frequency, protein supplements, increasing calories from fat/protein/carbohydrates, etc. Patient will benefit from participation in pulmonary rehab for nutrition, exercise, and lifestyle modification.             Psychosocial: Target Goals: Acknowledge presence or absence of significant depression and/or stress, maximize coping skills, provide positive support system. Participant is able to verbalize types and ability to use techniques and skills needed for reducing stress and depression.  Initial Review & Psychosocial Screening:  Initial Psych Review & Screening - 04/24/23 0923       Initial Review   Current issues with None Identified      Family Dynamics   Good Support System? Yes    Comments sister and other family members      Barriers   Psychosocial barriers to participate in program There are no identifiable barriers or psychosocial needs.      Screening Interventions   Interventions Encouraged to exercise             Quality of Life Scores:  Scores of 19 and below usually indicate a poorer quality of life in these areas.  A difference of  2-3 points is a clinically meaningful difference.  A difference of 2-3 points in the total score of the Quality of Life Index has been associated with significant improvement in overall quality of life, self-image, physical symptoms, and general health in studies assessing change in quality of life.  PHQ-9: Review Flowsheet       04/24/2023  Depression screen PHQ 2/9  Decreased Interest 0  Down, Depressed, Hopeless 0  PHQ - 2 Score 0  Altered sleeping 0  Tired, decreased energy 0  Change in appetite 0  Feeling bad or failure about yourself  0  Trouble concentrating 0  Moving slowly or fidgety/restless 0   Suicidal thoughts 0  PHQ-9 Score 0  Difficult doing work/chores Not difficult at all    Details           Interpretation of Total Score  Total Score Depression Severity:  1-4 = Minimal depression, 5-9 = Mild depression, 10-14 = Moderate depression, 15-19 = Moderately severe depression, 20-27 = Severe depression   Psychosocial Evaluation and Intervention:  Psychosocial Evaluation - 04/24/23 0923       Psychosocial Evaluation & Interventions   Interventions Encouraged to exercise with the program and follow exercise prescription    Comments Skip denies any psychosocial barriers to exercise.    Expected Outcomes For Skip to participate in PR free of psychosocial barriers.    Continue Psychosocial Services  No Follow up required             Psychosocial Re-Evaluation:  Psychosocial Re-Evaluation     Row Name 04/26/23 1550 05/24/23 1223           Psychosocial Re-Evaluation   Current issues with None Identified None Identified      Comments No change in psychosocial assessment since orientation 04/24/2023. No concerns identified No new psychosocial barriers or concerns at this time.      Expected Outcomes For Skip to attend PR without any barriers or concerns For Skip to attend PR without any barriers or concerns      Interventions Encouraged to attend Pulmonary Rehabilitation for the exercise Encouraged to attend Pulmonary Rehabilitation for the exercise      Continue Psychosocial Services  No Follow up required No Follow up required               Psychosocial Discharge (Final Psychosocial Re-Evaluation):  Psychosocial Re-Evaluation - 05/24/23 1223       Psychosocial Re-Evaluation   Current issues with None Identified    Comments No new psychosocial barriers or concerns at this time.    Expected Outcomes For Skip to attend PR without any barriers or concerns    Interventions Encouraged to attend Pulmonary Rehabilitation for the exercise    Continue Psychosocial  Services  No Follow up required             Education: Education Goals: Education classes will be provided on a weekly basis, covering required topics. Participant will state understanding/return demonstration of topics presented.  Learning Barriers/Preferences:  Learning Barriers/Preferences - 04/24/23 0924       Learning Barriers/Preferences   Learning Barriers Sight    Learning Preferences Skilled Demonstration             Education Topics: Introduction to Pulmonary Rehab Group instruction provided by PowerPoint, verbal discussion, and written material to support subject matter. Instructor reviews what Pulmonary Rehab is, the purpose of the program, and how patients are referred.     Know Your Numbers Group instruction that is supported by a PowerPoint presentation. Instructor discusses importance of knowing and understanding resting, exercise, and post-exercise oxygen saturation, heart rate, and blood pressure. Oxygen saturation, heart rate, blood pressure, rating of perceived exertion, and dyspnea are reviewed along with a normal range for these values.  Flowsheet Row PULMONARY REHAB OTHER RESPIRATORY from 05/04/2023 in [facility] for Heart, Vascular, & Lung Health  Date 05/04/23  Educator EP  Instruction Review Code 1- Verbalizes Understanding       Exercise for the Pulmonary Patient Group instruction that is supported by a PowerPoint presentation. Instructor discusses benefits  of exercise, core components of exercise, frequency, duration, and intensity of an exercise routine, importance of utilizing pulse oximetry during exercise, safety while exercising, and options of places to exercise outside of rehab.       MET Level  Group instruction provided by PowerPoint, verbal discussion, and written material to support subject matter. Instructor reviews what METs are and how to increase METs.    Pulmonary Medications Verbally interactive  group education provided by instructor with focus on inhaled medications and proper administration.   Anatomy and Physiology of the Respiratory System Group instruction provided by PowerPoint, verbal discussion, and written material to support subject matter. Instructor reviews respiratory cycle and anatomical components of the respiratory system and their functions. Instructor also reviews differences in obstructive and restrictive respiratory diseases with examples of each.    Oxygen Safety Group instruction provided by PowerPoint, verbal discussion, and written material to support subject matter. There is an overview of "What is Oxygen" and "Why do we need it".  Instructor also reviews how to create a safe environment for oxygen use, the importance of using oxygen as prescribed, and the risks of noncompliance. There is a brief discussion on traveling with oxygen and resources the patient may utilize. Flowsheet Row PULMONARY REHAB OTHER RESPIRATORY from 05/11/2023 in [facility] for Heart, Vascular, & Lung Health  Date 05/11/23  Educator RN  Instruction Review Code 1- Verbalizes Understanding       Oxygen Use Group instruction provided by PowerPoint, verbal discussion, and written material to discuss how supplemental oxygen is prescribed and different types of oxygen supply systems. Resources for more information are provided.    Breathing Techniques Group instruction that is supported by demonstration and informational handouts. Instructor discusses the benefits of pursed lip and diaphragmatic breathing and detailed demonstration on how to perform both.  Flowsheet Row PULMONARY REHAB OTHER RESPIRATORY from 05/25/2023 in [facility] for Heart, Vascular, & Lung Health  Date 05/25/23  Educator RN  Instruction Review Code 1- Verbalizes Understanding        Risk Factor Reduction Group instruction that is supported by a PowerPoint  presentation. Instructor discusses the definition of a risk factor, different risk factors for pulmonary disease, and how the heart and lungs work together.   MD Day A group question and answer session with a medical doctor that allows participants to ask questions that relate to their pulmonary disease state.   Nutrition for the Pulmonary Patient Group instruction provided by PowerPoint slides, verbal discussion, and written materials to support subject matter. The instructor gives an explanation and review of healthy diet recommendations, which includes a discussion on weight management, recommendations for fruit and vegetable consumption, as well as protein, fluid, caffeine, fiber, sodium, sugar, and alcohol. Tips for eating when patients are short of breath are discussed.    Other Education Group or individual verbal, written, or video instructions that support the educational goals of the pulmonary rehab program.    Knowledge Questionnaire Score:  Knowledge Questionnaire Score - 04/24/23 0924       Knowledge Questionnaire Score   Pre Score 18/18             Core Components/Risk Factors/Patient Goals at Admission:  Personal Goals and Risk Factors at Admission - 04/24/23 0928       Core Components/Risk Factors/Patient Goals on Admission    Weight Management Yes;Weight Gain    Intervention Weight Management: Develop a combined nutrition and exercise program designed to reach  desired caloric intake, while maintaining appropriate intake of nutrient and fiber, sodium and fats, and appropriate energy expenditure required for the weight goal.;Weight Management: Provide education and appropriate resources to help participant work on and attain dietary goals.    Expected Outcomes Weight Gain: Understanding of general recommendations for a high calorie, high protein meal plan that promotes weight gain by distributing calorie intake throughout the day with the consumption for 4-5 meals,  snacks, and/or supplements;Understanding of distribution of calorie intake throughout the day with the consumption of 4-5 meals/snacks;Understanding recommendations for meals to include 15-35% energy as protein, 25-35% energy from fat, 35-60% energy from carbohydrates, less than 200mg  of dietary cholesterol, 20-35 gm of total fiber daily;Short Term: Continue to assess and modify interventions until short term weight is achieved;Long Term: Adherence to nutrition and physical activity/exercise program aimed toward attainment of established weight goal    Improve shortness of breath with ADL's Yes    Intervention Provide education, individualized exercise plan and daily activity instruction to help decrease symptoms of SOB with activities of daily living.    Expected Outcomes Short Term: Improve cardiorespiratory fitness to achieve a reduction of symptoms when performing ADLs;Long Term: Be able to perform more ADLs without symptoms or delay the onset of symptoms             Core Components/Risk Factors/Patient Goals Review:   Goals and Risk Factor Review     Row Name 04/26/23 1552 05/24/23 1226           Core Components/Risk Factors/Patient Goals Review   Personal Goals Review Weight Management/Obesity;Develop more efficient breathing techniques such as purse lipped breathing and diaphragmatic breathing and practicing self-pacing with activity.;Improve shortness of breath with ADL's Weight Management/Obesity;Develop more efficient breathing techniques such as purse lipped breathing and diaphragmatic breathing and practicing self-pacing with activity.;Improve shortness of breath with ADL's      Review Unable to evaluate any progress towards goals yet. Skip is scheduled to begin on 7/9. Goal progressing for weight gain. Skip is working with staff dietician to obtain weight gain goal. Goal progressing for improve shortness of breath with ADL's. Goal progressing on developing more efficient breathing  techniques such as purse lipped breathing and diaphragmatic breathing.      Expected Outcomes For Skip to gain weight, improve his shortness of breath with ADLs, and develop more efficient breathing techniques. See admission goals               Core Components/Risk Factors/Patient Goals at Discharge (Final Review):   Goals and Risk Factor Review - 05/24/23 1226       Core Components/Risk Factors/Patient Goals Review   Personal Goals Review Weight Management/Obesity;Develop more efficient breathing techniques such as purse lipped breathing and diaphragmatic breathing and practicing self-pacing with activity.;Improve shortness of breath with ADL's    Review Goal progressing for weight gain. Skip is working with staff dietician to obtain weight gain goal. Goal progressing for improve shortness of breath with ADL's. Goal progressing on developing more efficient breathing techniques such as purse lipped breathing and diaphragmatic breathing.    Expected Outcomes See admission goals             ITP Comments: Pt is making expected progress toward Pulmonary Rehab goals after completing 8 sessions. Recommend continued exercise, life style modification, education, and utilization of breathing techniques to increase stamina and strength, while also decreasing shortness of breath with exertion.     Comments: Dr. Mechele Collin is Medical Director for Pulmonary  Rehab at [facility].

## 2023-06-01 ENCOUNTER — Encounter (HOSPITAL_COMMUNITY)
Admission: RE | Admit: 2023-06-01 | Discharge: 2023-06-01 | Disposition: A | Discharge status: Home / Self Care | Payer: Medicare Other | Source: Ambulatory Visit | Attending: Pulmonary Disease | Admitting: Pulmonary Disease

## 2023-06-01 DIAGNOSIS — J9600 Acute respiratory failure, unspecified whether with hypoxia or hypercapnia: Secondary | ICD-10-CM | POA: Diagnosis not present

## 2023-06-01 NOTE — Progress Notes (Signed)
Daily Session Note  Patient Details  Name: Darryle Mahfouz MRN: [id number] Date of Birth: 05-23-1956 Referring Provider:   [name] Pulmonary Rehab Walk Test from 04/24/2023 in [facility] for Heart, Vascular, & Lung Health  Referring Provider [name]       Encounter Date: 06/01/2023  Check In:  Session Check In - 06/01/23 0901       Check-In   Supervising physician immediately available to respond to emergencies [facility] MD immediately available    Physician(s) Joni Reining, NP    Location [facility]    Staff Present Raford Pitcher, MS, ACSM-CEP, Exercise Physiologist;Mary Gerre Scull, RN, BSN;Casey Katrinka Blazing, RT;Randi Reeve BS, ACSM-CEP, Exercise Physiologist    Virtual Visit No    Medication changes reported     No    Fall or balance concerns reported    No    Tobacco Cessation No Change    Warm-up and Cool-down Performed as group-led instruction    Resistance Training Performed Yes    VAD Patient? No    PAD/SET Patient? No      Pain Assessment   Currently in Pain? No/denies    Multiple Pain Sites No             Capillary Blood Glucose: No results found for this or any previous visit (from the past 24 hour(s)).    Social History   Tobacco Use  Smoking Status Never  Smokeless Tobacco Never    Goals Met:  Proper associated with RPD/PD & O2 Sat Independence with exercise equipment Exercise tolerated well No report of concerns or symptoms today Strength training completed today  Goals Unmet:  Not Applicable  Comments: Service time is from 0812 to 0945.    Dr. Mechele Collin is Medical Director for Pulmonary Rehab at [facility]

## 2023-06-06 ENCOUNTER — Encounter (HOSPITAL_COMMUNITY): Admission: RE | Admit: 2023-06-06 | Payer: Medicare Other | Source: Ambulatory Visit

## 2023-06-06 DIAGNOSIS — J9600 Acute respiratory failure, unspecified whether with hypoxia or hypercapnia: Secondary | ICD-10-CM | POA: Diagnosis not present

## 2023-06-06 MED ORDER — ELIQUIS 2.5 MG TABLET
ORAL_TABLET | Freq: Two times a day (BID) | ORAL | 0 refills | 90 days | Status: CP
Start: 2023-06-06 — End: ?

## 2023-06-06 NOTE — Progress Notes (Signed)
Home Exercise Prescription I have reviewed a Home Exercise Prescription with Rickey Erickson. Skip is currently exercising at home. He uses the seated cycle machine and is walking 2-7 days/wk for 15-20 min/day. I encouraged him to increase his time to 30 min/day. Skip tries walking outside when the weather is good. I recommended he keep walking outside weather permitted. Skip agreed with my recommendations. I am confident in Skip completing an exercise regimen at home. He seems motivated to exercise and improve his functional capacity. The patient stated that their goals were to increase his conditioning level. We reviewed exercise guidelines, target heart rate during exercise, RPE Scale, weather conditions, endpoints for exercise, warmup and cool down. The patient is encouraged to come to me with any questions. I will continue to follow up with the patient to assist them with progression and safety. Spent 15 min with patient discussing home exercise plan and goals  Joya San, MS, ACSM-CEP 06/06/2023 3:59 PM

## 2023-06-06 NOTE — Progress Notes (Signed)
Daily Session Note  Patient Details  Name: Rickey Erickson MRN: [id number] Date of Birth: [birth year] Referring Provider:   [name] Pulmonary Rehab Walk Test from 04/24/2023 in [facility] for Heart, Vascular, & Lung Health  Referring Provider [name]       Encounter Date: 06/06/2023  Check In:  Session Check In - 06/06/23 0837       Check-In   Supervising physician immediately available to respond to emergencies [facility] MD immediately available    Physician(s) Jari Favre, PA    Location [facility]    Staff Present Raford Pitcher, MS, ACSM-CEP, Exercise Physiologist;Mary Gerre Scull, RN, BSN;Randi Idelle Crouch BS, ACSM-CEP, Exercise Physiologist;Casey Katrinka Blazing, RT    Virtual Visit No    Medication changes reported     No    Fall or balance concerns reported    No    Tobacco Cessation No Change    Warm-up and Cool-down Performed as group-led instruction    Resistance Training Performed Yes    VAD Patient? No    PAD/SET Patient? No      Pain Assessment   Currently in Pain? No/denies    Multiple Pain Sites No             Capillary Blood Glucose: No results found for this or any previous visit (from the past 24 hour(s)).    Social History   Tobacco Use  Smoking Status Never  Smokeless Tobacco Never    Goals Met:  Proper associated with RPD/PD & O2 Sat Independence with exercise equipment Exercise tolerated well No report of concerns or symptoms today Strength training completed today  Goals Unmet:  Not Applicable  Comments: Service time is from 0812 to 0924.    Dr. Mechele Collin is Medical Director for Pulmonary Rehab at [facility].

## 2023-06-08 ENCOUNTER — Encounter (HOSPITAL_COMMUNITY)
Admission: RE | Admit: 2023-06-08 | Discharge: 2023-06-08 | Disposition: A | Discharge status: Home / Self Care | Payer: Medicare Other | Source: Ambulatory Visit | Attending: Pulmonary Disease | Admitting: Pulmonary Disease

## 2023-06-08 DIAGNOSIS — J9600 Acute respiratory failure, unspecified whether with hypoxia or hypercapnia: Secondary | ICD-10-CM | POA: Diagnosis not present

## 2023-06-08 NOTE — Progress Notes (Signed)
Daily Session Note  Patient Details  Name: Rickey Erickson MRN: [id number] Date of Birth: 10-28-1955 Referring Provider:   [name] Pulmonary Rehab Walk Test from 04/24/2023 in [facility] for [facility], Vascular, & Lung Health  Referring Provider [name]       Encounter Date: 06/08/2023  Check In:  Session Check In - 06/08/23 0835       Check-In   Supervising physician immediately available to respond to emergencies [facility] MD immediately available    Physician(s) Edd Fabian, NP    Location [facility]    Staff Present Raford Pitcher, MS, ACSM-CEP, Exercise Physiologist;Mary Gerre Scull, RN, BSN; Idelle Crouch BS, ACSM-CEP, Exercise Physiologist;Casey Katrinka Blazing, RT    Virtual Visit No    Medication changes reported     No    Fall or balance concerns reported    No    Tobacco Cessation No Change    Warm-up and Cool-down Performed as group-led instruction    Resistance Training Performed Yes    VAD Patient? No    PAD/SET Patient? No      Pain Assessment   Currently in Pain? No/denies    Multiple Pain Sites No             Capillary Blood Glucose: No results found for this or any previous visit (from the past 24 hour(s)).    Social History   Tobacco Use  Smoking Status Never  Smokeless Tobacco Never    Goals Met:  Independence with exercise equipment Improved SOB with ADL's Exercise tolerated well No report of concerns or symptoms today Strength training completed today  Goals Unmet:  Not Applicable  Comments: Service time is from 0812 to 0927.    Dr. Mechele Collin is Medical Director for Pulmonary Rehab at [facility].

## 2023-06-13 ENCOUNTER — Encounter (HOSPITAL_COMMUNITY)
Admission: RE | Admit: 2023-06-13 | Discharge: 2023-06-13 | Disposition: A | Discharge status: Home / Self Care | Payer: Medicare Other | Source: Ambulatory Visit | Attending: Pulmonary Disease | Admitting: Pulmonary Disease

## 2023-06-13 VITALS — Wt 131.6 lb

## 2023-06-13 DIAGNOSIS — J9600 Acute respiratory failure, unspecified whether with hypoxia or hypercapnia: Secondary | ICD-10-CM | POA: Diagnosis not present

## 2023-06-13 NOTE — Progress Notes (Signed)
Daily Session Note  Patient Details  Name: Rickey Erickson MRN: [id number] Date of Birth: 1956-02-21 Referring Provider:   [name] Pulmonary Rehab Walk Test from 04/24/2023 in [facility] for [facility], Vascular, & Lung [facility]  Referring Provider [name]       Encounter Date: 06/13/2023  Check In:  Session Check In - 06/13/23 0902       Check-In   Supervising physician immediately available to respond to emergencies [facility] MD immediately available    Physician(s) Edd Fabian, NP    [facility]    Staff Present Raford Pitcher, MS, ACSM-CEP, Exercise Physiologist;Mary Gerre Scull, RN, BSN;Casey Hermine Messick Belarus, RD, LDN    Virtual Visit No    Medication changes reported     No    Fall or balance concerns reported    No    Tobacco Cessation No Change    Warm-up and Cool-down Performed as group-led instruction    Resistance Training Performed Yes    VAD Patient? No    PAD/SET Patient? No      Pain Assessment   Currently in Pain? No/denies    Multiple Pain Sites No             Capillary Blood Glucose: No results found for this or any previous visit (from the past 24 hour(s)).   Exercise Prescription Changes - 06/13/23 0900       Response to Exercise   Blood Pressure (Admit) 130/70    Blood Pressure (Exercise) 172/66    Blood Pressure (Exit) 112/66    Heart Rate (Admit) 107 bpm    Heart Rate (Exercise) 126 bpm    Heart Rate (Exit) 107 bpm    Oxygen Saturation (Admit) 98 %    Oxygen Saturation (Exercise) 91 %    Oxygen Saturation (Exit) 96 %    Rating of Perceived Exertion (Exercise) 15    Perceived Dyspnea (Exercise) 4    Duration Continue with 30 min of aerobic exercise without signs/symptoms of physical distress.    Intensity THRR unchanged      Progression   Progression Continue to progress workloads to maintain intensity without signs/symptoms of physical distress.      Resistance Training   Training  Prescription Yes    Weight red bands    Reps 10-15    Time 10 Minutes      NuStep   Level 3    Minutes 15    METs 3      Track   Laps 5    Minutes 15    METs 1.77      Oxygen   Maintain Oxygen Saturation 88% or higher             Social History   Tobacco Use  Smoking Status Never  Smokeless Tobacco Never    Goals Met:  Exercise tolerated well No report of concerns or symptoms today Strength training completed today  Goals Unmet:  Not Applicable  Comments: Service time is from 0824 to [phone].    Dr. Mechele Collin is Medical Director for Pulmonary Rehab at [facility].

## 2023-06-15 ENCOUNTER — Encounter (HOSPITAL_COMMUNITY)
Admission: RE | Admit: 2023-06-15 | Discharge: 2023-06-15 | Disposition: A | Discharge status: Home / Self Care | Payer: Medicare Other | Source: Ambulatory Visit | Attending: Pulmonary Disease | Admitting: Pulmonary Disease

## 2023-06-15 DIAGNOSIS — J9600 Acute respiratory failure, unspecified whether with hypoxia or hypercapnia: Secondary | ICD-10-CM

## 2023-06-15 NOTE — Progress Notes (Signed)
Daily Session Note  Patient Details  Name: Carole Granillo MRN: [id number] Date of Birth: 14-Aug-1956 Referring Provider:   [name] Pulmonary Rehab Walk Test from 04/24/2023 in [facility] for [facility], Vascular, & Lung [facility]  Referring Provider [name]       Encounter Date: 06/15/2023  Check In:  Session Check In - 06/15/23 0836       Check-In   Supervising physician immediately available to respond to emergencies [facility] MD immediately available    Physician(s) Robin Searing, NP    Location [facility]    Staff Present Raford Pitcher, MS, ACSM-CEP, Exercise Physiologist;Randi Dionisio Paschal, ACSM-CEP, Exercise Physiologist;Airam Runions Gerre Scull, RN, Fuller Plan, RT    Virtual Visit No    Medication changes reported     No    Fall or balance concerns reported    No    Tobacco Cessation No Change    Warm-up and Cool-down Performed as group-led instruction    Resistance Training Performed Yes    VAD Patient? No    PAD/SET Patient? No      Pain Assessment   Currently in Pain? No/denies    Multiple Pain Sites No             Capillary Blood Glucose: No results found for this or any previous visit (from the past 24 hour(s)).    Social History   Tobacco Use  Smoking Status Never  Smokeless Tobacco Never    Goals Met:  Independence with exercise equipment Exercise tolerated well No report of concerns or symptoms today Strength training completed today  Goals Unmet:  Not Applicable  Comments: Service time is from 0815 to 0921    Dr. Mechele Collin is Medical Director for Pulmonary Rehab at [facility].

## 2023-06-20 ENCOUNTER — Encounter (HOSPITAL_COMMUNITY)
Admission: RE | Admit: 2023-06-20 | Discharge: 2023-06-20 | Disposition: A | Discharge status: Home / Self Care | Payer: Medicare Other | Source: Ambulatory Visit | Attending: Pulmonary Disease | Admitting: Pulmonary Disease

## 2023-06-20 DIAGNOSIS — J9600 Acute respiratory failure, unspecified whether with hypoxia or hypercapnia: Secondary | ICD-10-CM | POA: Diagnosis not present

## 2023-06-20 NOTE — Progress Notes (Signed)
Daily Session Note  Patient Details  Name: Rickey Erickson MRN: [id number] Date of Birth: 1956-03-16 Referring Provider:   [name] Pulmonary Rehab Walk Test from 04/24/2023 in [facility] for Heart, Vascular, & Lung Health  Referring Provider [name]       Encounter Date: 06/20/2023  Check In:  Session Check In - 06/20/23 8657       Check-In   Supervising physician immediately available to respond to emergencies [facility] immediately available    Physician(s) Carlyon Shadow, NP    [facility]    Staff Present Samantha Belarus, RD, Dutch Gray, RN, BSN;Randi Reeve BS, ACSM-CEP, Exercise Physiologist;Kaylee Earlene Plater, MS, ACSM-CEP, Exercise Physiologist;Kaycen Whitworth Katrinka Blazing, RT    Virtual Visit No    Medication changes reported     No    Fall or balance concerns reported    No    Tobacco Cessation No Change    Warm-up and Cool-down Performed as group-led instruction    Resistance Training Performed Yes    VAD Patient? No    PAD/SET Patient? No      Pain Assessment   Currently in Pain? No/denies    Multiple Pain Sites No             Capillary Blood Glucose: No results found for this or any previous visit (from the past 24 hour(s)).    Social History   Tobacco Use  Smoking Status Never  Smokeless Tobacco Never    Goals Met:  Proper associated with RPD/PD & O2 Sat Independence with exercise equipment Exercise tolerated well No report of concerns or symptoms today Strength training completed today  Goals Unmet:  Not Applicable  Comments: Service time is from 0809 to [phone].    Dr. Mechele Collin is Medical Director for Pulmonary Rehab at [facility].

## 2023-06-22 ENCOUNTER — Encounter (HOSPITAL_COMMUNITY)
Admission: RE | Admit: 2023-06-22 | Discharge: 2023-06-22 | Disposition: A | Discharge status: Home / Self Care | Payer: Medicare Other | Source: Ambulatory Visit | Attending: Pulmonary Disease | Admitting: Pulmonary Disease

## 2023-06-22 DIAGNOSIS — J9600 Acute respiratory failure, unspecified whether with hypoxia or hypercapnia: Secondary | ICD-10-CM | POA: Diagnosis not present

## 2023-06-22 NOTE — Progress Notes (Signed)
Daily Session Note  Patient Details  Name: Sellers Beld MRN: [id number] Date of Birth: [birth year] Referring Provider:   [name] Pulmonary Rehab Walk Test from 04/24/2023 in [facility] for Heart, Vascular, & Lung Health  Referring Provider [name]       Encounter Date: 06/22/2023  Check In:  Session Check In - 06/22/23 0820       Check-In   Supervising physician immediately available to respond to emergencies [facility] MD immediately available    Physician(s) Bernadene Person, NP    Location [facility]    Staff Present Raford Pitcher, MS, ACSM-CEP, Exercise Physiologist;Casey Katrinka Blazing, RT    Virtual Visit No    Medication changes reported     No    Fall or balance concerns reported    No    Tobacco Cessation No Change    Warm-up and Cool-down Performed as group-led instruction    Resistance Training Performed Yes    VAD Patient? No    PAD/SET Patient? No      Pain Assessment   Currently in Pain? No/denies    Multiple Pain Sites No             Capillary Blood Glucose: No results found for this or any previous visit (from the past 24 hour(s)).    Social History   Tobacco Use  Smoking Status Never  Smokeless Tobacco Never    Goals Met:  Proper associated with RPD/PD & O2 Sat Independence with exercise equipment Exercise tolerated well No report of concerns or symptoms today Strength training completed today  Goals Unmet:  Not Applicable  Comments: Service time is from 0803 to 0920.    Dr. Mechele Collin is Medical Director for Pulmonary Rehab at [facility].

## 2023-06-27 ENCOUNTER — Encounter (HOSPITAL_COMMUNITY)
Admission: RE | Admit: 2023-06-27 | Discharge: 2023-06-27 | Disposition: A | Discharge status: Home / Self Care | Payer: Medicare Other | Source: Ambulatory Visit | Attending: Pulmonary Disease | Admitting: Pulmonary Disease

## 2023-06-27 VITALS — Wt 132.7 lb

## 2023-06-27 DIAGNOSIS — J9600 Acute respiratory failure, unspecified whether with hypoxia or hypercapnia: Secondary | ICD-10-CM | POA: Insufficient documentation

## 2023-06-27 NOTE — Progress Notes (Signed)
Daily Session Note  Patient Details  Name: Rickey Erickson MRN: [id number] Date of Birth: 1956/10/04 Referring Provider:   [name] Pulmonary Rehab Walk Test from 04/24/2023 in [facility] for Heart, Vascular, & Lung Health  Referring Provider [name]       Encounter Date: 06/27/2023  Check In:  Session Check In - 06/27/23 7829       Check-In   Supervising physician immediately available to respond to emergencies [facility] MD immediately available    Physician(s) Bernadene Person, NP    Location [facility]    Staff Present Raford Pitcher, MS, ACSM-CEP, Exercise Physiologist;Baden Betsch Synthia Innocent, RN, BSN;Randi Reeve BS, ACSM-CEP, Exercise Physiologist    Virtual Visit No    Medication changes reported     No    Fall or balance concerns reported    No    Tobacco Cessation No Change    Warm-up and Cool-down Performed as group-led instruction    Resistance Training Performed Yes    VAD Patient? No    PAD/SET Patient? No      Pain Assessment   Currently in Pain? No/denies    Multiple Pain Sites No             Capillary Blood Glucose: No results found for this or any previous visit (from the past 24 hour(s)).   Exercise Prescription Changes - 06/27/23 0900       Response to Exercise   Blood Pressure (Admit) 114/68    Blood Pressure (Exercise) 124/58    Blood Pressure (Exit) 120/68    Heart Rate (Admit) 111 bpm    Heart Rate (Exercise) 124 bpm    Heart Rate (Exit) 95 bpm    Oxygen Saturation (Admit) 95 %    Oxygen Saturation (Exercise) 92 %    Oxygen Saturation (Exit) 97 %    Rating of Perceived Exertion (Exercise) 12    Perceived Dyspnea (Exercise) 1    Duration Continue with 30 min of aerobic exercise without signs/symptoms of physical distress.    Intensity THRR unchanged      Progression   Progression Continue to progress workloads to maintain intensity without signs/symptoms of physical distress.      Resistance  Training   Training Prescription Yes    Weight red bands    Reps 10-15    Time 10 Minutes      NuStep   Level 3    Minutes 15    METs 3      Track   Laps 5    Minutes 15    METs 1.77             Social History   Tobacco Use  Smoking Status Never  Smokeless Tobacco Never    Goals Met:  Proper associated with RPD/PD & O2 Sat Independence with exercise equipment Exercise tolerated well No report of concerns or symptoms today Strength training completed today  Goals Unmet:  Not Applicable  Comments: Service time is from 0817 to 0933.    Dr. Mechele Collin is Medical Director for Pulmonary Rehab at [facility].

## 2023-06-28 DIAGNOSIS — J9610 Chronic respiratory failure, unspecified whether with hypoxia or hypercapnia: Principal | ICD-10-CM

## 2023-06-28 NOTE — Progress Notes (Signed)
Pulmonary Individual Treatment Plan  Patient Details  Name: Rickey Erickson MRN: [id number] Date of Birth: Jul 30, 1956 Referring Provider:   [name] Pulmonary Rehab Walk Test from 04/24/2023 in [facility]  Referring Provider [name]       Initial Encounter Date:  Flowsheet Row Pulmonary Rehab Walk Test from 04/24/2023 in [facility]  Date 04/24/23       Visit Diagnosis: Acute respiratory failure, unspecified whether with hypoxia or hypercapnia (HCC)  Patient's Home Medications on Admission:   Current Outpatient Medications:    acetaminophen (TYLENOL) 500 MG tablet, Take 500 mg by mouth every 6 (six) hours as needed for headache (pain)., Disp: , Rfl:    amLODipine (NORVASC) 10 MG tablet, Take by mouth., Disp: , Rfl:    atorvastatin (LIPITOR) 20 MG tablet, Take 20 mg by mouth every morning., Disp: , Rfl:    calcium carbonate (OSCAL) 1500 (600 Ca) MG TABS tablet, Take 1,500 mg by mouth every morning., Disp: , Rfl:    clopidogrel (PLAVIX) 75 MG tablet, Take 75 mg by mouth every morning., Disp: , Rfl:    diltiazem (CARDIZEM CD) 180 MG 24 hr capsule, Take 180 mg by mouth every evening. (Patient not taking: Reported on 04/24/2023), Disp: , Rfl:    mirtazapine (REMERON) 45 MG tablet, Take 45 mg by mouth every evening., Disp: , Rfl:    mycophenolate (MYFORTIC) 180 MG EC tablet, Take 180 mg by mouth 2 (two) times daily., Disp: , Rfl:    pantoprazole (PROTONIX) 40 MG tablet, Take 40 mg by mouth every morning., Disp: , Rfl:    tacrolimus (PROGRAF) 1 MG capsule, Take 1 mg by mouth 2 (two) times daily. Take with a 5 mg capsule for a total dose of 6 mg twice daily, Disp: , Rfl:    tacrolimus (PROGRAF) 5 MG capsule, Take 5 mg by mouth 2 (two) times daily. Take with a 1 mg capsule for a total dose of 6 mg twice daily, Disp: , Rfl:    torsemide (DEMADEX) 100 MG tablet, Take 100 mg by  mouth See admin instructions. Take one tablet (100 mg) by mouth on Sunday, Tuesday, Thursday, Saturday mornings (non-dialysis days) (Patient not taking: Reported on 04/24/2023), Disp: , Rfl:   Past Medical History: Past Medical History:  Diagnosis Date   Anemia    Heart failure (HCC)    Hypertension    Osteoporosis    Renal disorder     Tobacco Use: Social History   Tobacco Use  Smoking Status Never  Smokeless Tobacco Never    Labs: Review Flowsheet       Latest Ref Rng & Units 01/06/2022 09/08/2022 03/07/2023  Labs for ITP Cardiac and Pulmonary Rehab  Cholestrol 0 - 200 mg/dL 829  562  -  LDL (calc) 0 - 99 mg/dL 90  80  -  HDL-C >13 mg/dL 30  39  -  Trlycerides <150 mg/dL 90  86  -  Hemoglobin Y8M 4.8 - 5.6 % 4.9  - -  PH, Arterial 7.35 - 7.45 - - 7.497   PCO2 arterial 32 - 48 mmHg - - 33.6   Bicarbonate 20.0 - 28.0 mmol/L - - 26.0   TCO2 22 - 32 mmol/L - - 27   O2 Saturation % - - 96     Details            Capillary Blood Glucose: No  results found for: "GLUCAP"   Pulmonary Assessment Scores:  Pulmonary Assessment Scores     Row Name 04/24/23 0935         ADL UCSD   ADL Phase Entry     SOB Score total 50       CAT Score   CAT Score 16       mMRC Score   mMRC Score 3             UCSD: Self-administered rating of dyspnea associated with activities of daily living (ADLs) 6-point scale (0 = "not at all" to 5 = "maximal or unable to do because of breathlessness")  Scoring Scores range from 0 to 120.  Minimally important difference is 5 units  CAT: CAT can identify the health impairment of COPD patients and is better correlated with disease progression.  CAT has a scoring range of zero to 40. The CAT score is classified into four groups of low (less than 10), medium (10 - 20), high (21-30) and very high (31-40) based on the impact level of disease on health status. A CAT score over 10 suggests significant symptoms.  A worsening CAT score could be  explained by an exacerbation, poor medication adherence, poor inhaler technique, or progression of COPD or comorbid conditions.  CAT MCID is 2 points  mMRC: mMRC (Modified Medical Research Council) Dyspnea Scale is used to assess the degree of baseline functional disability in patients of respiratory disease due to dyspnea. No minimal important difference is established. A decrease in score of 1 point or greater is considered a positive change.   Pulmonary Function Assessment:  Pulmonary Function Assessment - 04/24/23 0933       Breath   Bilateral Breath Sounds Decreased    Shortness of Breath Yes;Limiting activity             Exercise Target Goals: Exercise Program Goal: Individual exercise prescription set using results from initial 6 min walk test and THRR while considering  patient's activity barriers and safety.   Exercise Prescription Goal: Initial exercise prescription builds to 30-45 minutes a day of aerobic activity, 2-3 days per week.  Home exercise guidelines will be given to patient during program as part of exercise prescription that the participant will acknowledge.  Activity Barriers & Risk Stratification:  Activity Barriers & Cardiac Risk Stratification - 04/24/23 0936       Activity Barriers & Cardiac Risk Stratification   Activity Barriers Balance Concerns;Shortness of Breath;Muscular Weakness;Deconditioning   Rt arm herograft            6 Minute Walk:  6 Minute Walk     Row Name 04/24/23 1112         6 Minute Walk   Phase Initial     Distance 1015 feet     Walk Time 6 minutes     # of Rest Breaks 0     MPH 1.92     METS 3.36     RPE 11     Perceived Dyspnea  3     VO2 Peak 11.75     Symptoms No     Resting HR 110 bpm     Resting BP 120/62     Resting Oxygen Saturation  98 %     Exercise Oxygen Saturation  during 6 min walk 95 %     Max Ex. HR 124 bpm     Max Ex. BP 132/64     2 Minute Post BP 122/64  Interval HR   1 Minute HR  114     2 Minute HR 118     3 Minute HR 121     4 Minute HR 123     5 Minute HR 123     6 Minute HR 124     Interval Heart Rate? Yes       Interval Oxygen   Interval Oxygen? Yes     Baseline Oxygen Saturation % 98 %     1 Minute Oxygen Saturation % 99 %     1 Minute Liters of Oxygen 2 L     2 Minute Oxygen Saturation % 97 %     2 Minute Liters of Oxygen 2 L     3 Minute Oxygen Saturation % 95 %     3 Minute Liters of Oxygen 2 L     4 Minute Oxygen Saturation % 97 %     4 Minute Liters of Oxygen 2 L     5 Minute Oxygen Saturation % 95 %     5 Minute Liters of Oxygen 2 L     6 Minute Oxygen Saturation % 96 %     6 Minute Liters of Oxygen 2 L     2 Minute Post Oxygen Saturation % 100 %     2 Minute Post Liters of Oxygen 2 L              Oxygen Initial Assessment:  Oxygen Initial Assessment - 04/24/23 0932       Home Oxygen   Home Oxygen Device E-Tanks;Home Concentrator    Sleep Oxygen Prescription Continuous    Liters per minute 1    Home Exercise Oxygen Prescription Continuous    Liters per minute 2    Home Resting Oxygen Prescription Continuous    Liters per minute 2    Compliance with Home Oxygen Use Yes      Initial 6 min Walk   Oxygen Used Continuous    Liters per minute 2      Program Oxygen Prescription   Program Oxygen Prescription Continuous    Liters per minute 2      Intervention   Short Term Goals To learn and exhibit compliance with exercise, home and travel O2 prescription;To learn and understand importance of maintaining oxygen saturations>88%;To learn and demonstrate proper use of respiratory medications;To learn and understand importance of monitoring SPO2 with pulse oximeter and demonstrate accurate use of the pulse oximeter.;To learn and demonstrate proper pursed lip breathing techniques or other breathing techniques.     Long  Term Goals Exhibits compliance with exercise, home  and travel O2 prescription;Verbalizes importance of monitoring SPO2  with pulse oximeter and return demonstration;Exhibits proper breathing techniques, such as pursed lip breathing or other method taught during program session;Maintenance of O2 saturations>88%;Compliance with respiratory medication             Oxygen Re-Evaluation:  Oxygen Re-Evaluation     Row Name 04/28/23 1053 05/19/23 1043 06/19/23 1235         Program Oxygen Prescription   Program Oxygen Prescription Continuous Continuous Continuous     Liters per minute 2 2 2        Home Oxygen   Home Oxygen Device E-Tanks;Home Concentrator E-Tanks;Home Concentrator E-Tanks;Home Concentrator     Sleep Oxygen Prescription Continuous Continuous Continuous     Liters per minute 1 1 1      Home Exercise Oxygen Prescription Continuous Continuous Continuous  Liters per minute 2 2 2      Home Resting Oxygen Prescription Continuous Continuous Continuous     Liters per minute 2 2 2      Compliance with Home Oxygen Use Yes Yes Yes       Goals/Expected Outcomes   Short Term Goals To learn and exhibit compliance with exercise, home and travel O2 prescription;To learn and understand importance of maintaining oxygen saturations>88%;To learn and demonstrate proper use of respiratory medications;To learn and understand importance of monitoring SPO2 with pulse oximeter and demonstrate accurate use of the pulse oximeter.;To learn and demonstrate proper pursed lip breathing techniques or other breathing techniques.  To learn and exhibit compliance with exercise, home and travel O2 prescription;To learn and understand importance of maintaining oxygen saturations>88%;To learn and demonstrate proper use of respiratory medications;To learn and understand importance of monitoring SPO2 with pulse oximeter and demonstrate accurate use of the pulse oximeter.;To learn and demonstrate proper pursed lip breathing techniques or other breathing techniques.  To learn and exhibit compliance with exercise, home and travel O2  prescription;To learn and understand importance of maintaining oxygen saturations>88%;To learn and demonstrate proper use of respiratory medications;To learn and understand importance of monitoring SPO2 with pulse oximeter and demonstrate accurate use of the pulse oximeter.;To learn and demonstrate proper pursed lip breathing techniques or other breathing techniques.      Long  Term Goals Exhibits compliance with exercise, home  and travel O2 prescription;Verbalizes importance of monitoring SPO2 with pulse oximeter and return demonstration;Exhibits proper breathing techniques, such as pursed lip breathing or other method taught during program session;Maintenance of O2 saturations>88%;Compliance with respiratory medication Exhibits compliance with exercise, home  and travel O2 prescription;Verbalizes importance of monitoring SPO2 with pulse oximeter and return demonstration;Exhibits proper breathing techniques, such as pursed lip breathing or other method taught during program session;Maintenance of O2 saturations>88%;Compliance with respiratory medication Exhibits compliance with exercise, home  and travel O2 prescription;Verbalizes importance of monitoring SPO2 with pulse oximeter and return demonstration;Exhibits proper breathing techniques, such as pursed lip breathing or other method taught during program session;Maintenance of O2 saturations>88%;Compliance with respiratory medication     Goals/Expected Outcomes Compliance and understanding of oxygen saturation monitoring and breathing techniques to decrease shortness of breath. Compliance and understanding of oxygen saturation monitoring and breathing techniques to decrease shortness of breath. Compliance and understanding of oxygen saturation monitoring and breathing techniques to decrease shortness of breath.              Oxygen Discharge (Final Oxygen Re-Evaluation):  Oxygen Re-Evaluation - 06/19/23 1235       Program Oxygen Prescription    Program Oxygen Prescription Continuous    Liters per minute 2      Home Oxygen   Home Oxygen Device E-Tanks;Home Concentrator    Sleep Oxygen Prescription Continuous    Liters per minute 1    Home Exercise Oxygen Prescription Continuous    Liters per minute 2    Home Resting Oxygen Prescription Continuous    Liters per minute 2    Compliance with Home Oxygen Use Yes      Goals/Expected Outcomes   Short Term Goals To learn and exhibit compliance with exercise, home and travel O2 prescription;To learn and understand importance of maintaining oxygen saturations>88%;To learn and demonstrate proper use of respiratory medications;To learn and understand importance of monitoring SPO2 with pulse oximeter and demonstrate accurate use of the pulse oximeter.;To learn and demonstrate proper pursed lip breathing techniques or other breathing techniques.  Long  Term Goals Exhibits compliance with exercise, home  and travel O2 prescription;Verbalizes importance of monitoring SPO2 with pulse oximeter and return demonstration;Exhibits proper breathing techniques, such as pursed lip breathing or other method taught during program session;Maintenance of O2 saturations>88%;Compliance with respiratory medication    Goals/Expected Outcomes Compliance and understanding of oxygen saturation monitoring and breathing techniques to decrease shortness of breath.             Initial Exercise Prescription:  Initial Exercise Prescription - 04/24/23 0900       Date of Initial Exercise RX and Referring Provider   Date 04/24/23    Referring Provider [name]    Expected Discharge Date 07/20/23      Oxygen   Oxygen Continuous    Liters 2    Maintain Oxygen Saturation 88% or higher      NuStep   Level 1    SPM 50    Minutes 15      Track   Minutes 15    METs 1.5      Prescription Details   Frequency (times per week) 2    Duration Progress to 30 minutes of continuous aerobic without signs/symptoms of  physical distress      Intensity   THRR 40-80% of Max Heartrate 62-124    Ratings of Perceived Exertion 11-13    Perceived Dyspnea 0-4      Progression   Progression Continue to progress workloads to maintain intensity without signs/symptoms of physical distress.      Resistance Training   Training Prescription Yes    Weight red bands    Reps 10-15             Perform Capillary Blood Glucose checks as needed.  Exercise Prescription Changes:   Exercise Prescription Changes     Row Name 05/02/23 0900 05/16/23 0900 05/30/23 0900 06/13/23 0900 06/27/23 0900     Response to Exercise   Blood Pressure (Admit) 118/70 124/64 120/64 130/70 114/68   Blood Pressure (Exercise) 140/70 142/62 160/72 172/66 124/58   Blood Pressure (Exit) 106/64 102/62 118/62 112/66 120/68   Heart Rate (Admit) 109 bpm 118 bpm 111 bpm 107 bpm 111 bpm   Heart Rate (Exercise) 119 bpm 126 bpm 124 bpm 126 bpm 124 bpm   Heart Rate (Exit) 111 bpm 116 bpm 117 bpm 107 bpm 95 bpm   Oxygen Saturation (Admit) 100 % 100 % 100 % 98 % 95 %   Oxygen Saturation (Exercise) 100 % 100 % 100 % 91 % 92 %   Oxygen Saturation (Exit) 100 % 100 % 100 % 96 % 97 %   Rating of Perceived Exertion (Exercise) 13 13 15 15 12    Perceived Dyspnea (Exercise) 1 3 3 4 1    Duration Continue with 30 min of aerobic exercise without signs/symptoms of physical distress. Continue with 30 min of aerobic exercise without signs/symptoms of physical distress. Continue with 30 min of aerobic exercise without signs/symptoms of physical distress. Continue with 30 min of aerobic exercise without signs/symptoms of physical distress. Continue with 30 min of aerobic exercise without signs/symptoms of physical distress.   Intensity THRR unchanged THRR unchanged THRR unchanged THRR unchanged THRR unchanged     Progression   Progression Continue to progress workloads to maintain intensity without signs/symptoms of physical distress. Continue to progress  workloads to maintain intensity without signs/symptoms of physical distress. Continue to progress workloads to maintain intensity without signs/symptoms of physical distress. Continue to progress workloads to maintain  intensity without signs/symptoms of physical distress. Continue to progress workloads to maintain intensity without signs/symptoms of physical distress.     Resistance Training   Training Prescription Yes Yes Yes Yes Yes   Weight red bands red bands red bands red bands red bands   Reps 10-15 10-15 10-15 10-15 10-15   Time 10 Minutes 10 Minutes 10 Minutes 10 Minutes 10 Minutes     Oxygen   Oxygen Continuous Continuous --  Weaned to room air -- --   Liters 2 2 -- -- --     NuStep   Level 1 3 3 3 3    SPM -- -- 80 -- --   Minutes 15 15 15 15 15    METs 2.4 2.7 3.1 3 3      Track   Laps 7.5 8 5.5 5 5    Minutes 15 15 15 15 15    METs 2.23 2.23 1.85 1.77 1.77     Oxygen   Maintain Oxygen Saturation 88% or higher 88% or higher 88% or higher 88% or higher --            Exercise Comments:   Exercise Comments     Row Name 06/06/23 1539           Exercise Comments Completed home [facility]. Skip is currently exercising at home. He uses the seated cycle machine and is walking 2-7 days/wk for 15-20 min/day. I encouraged him to increase his time to 30 min/day. Skip tries walking outside when the weather is good. I recommended he keep walking outside weather permitted. Skip agreed with my recommendations. I am confident in Skip completing an exercise regimen at home. He seems motivated to exercise and improve his functional capacity.                Exercise Goals and Review:   Exercise Goals     Row Name 04/24/23 1610 04/28/23 1052 05/19/23 1039 06/19/23 1228       Exercise Goals   Increase Physical Activity Yes Yes Yes Yes    Intervention Provide advice, education, support and counseling about physical activity/exercise needs.;Develop an individualized exercise  prescription for aerobic and resistive training based on initial evaluation findings, risk stratification, comorbidities and participant's personal goals. Provide advice, education, support and counseling about physical activity/exercise needs.;Develop an individualized exercise prescription for aerobic and resistive training based on initial evaluation findings, risk stratification, comorbidities and participant's personal goals. Provide advice, education, support and counseling about physical activity/exercise needs.;Develop an individualized exercise prescription for aerobic and resistive training based on initial evaluation findings, risk stratification, comorbidities and participant's personal goals. Provide advice, education, support and counseling about physical activity/exercise needs.;Develop an individualized exercise prescription for aerobic and resistive training based on initial evaluation findings, risk stratification, comorbidities and participant's personal goals.    Expected Outcomes Short Term: Attend rehab on a regular basis to increase amount of physical activity.;Long Term: Exercising regularly at least 3-5 days a week.;Long Term: Add in home exercise to make exercise part of routine and to increase amount of physical activity. Short Term: Attend rehab on a regular basis to increase amount of physical activity.;Long Term: Exercising regularly at least 3-5 days a week.;Long Term: Add in home exercise to make exercise part of routine and to increase amount of physical activity. Short Term: Attend rehab on a regular basis to increase amount of physical activity.;Long Term: Exercising regularly at least 3-5 days a week.;Long Term: Add in home exercise to make exercise part of routine and  to increase amount of physical activity. Short Term: Attend rehab on a regular basis to increase amount of physical activity.;Long Term: Exercising regularly at least 3-5 days a week.;Long Term: Add in home  exercise to make exercise part of routine and to increase amount of physical activity.    Increase Strength and Stamina Yes Yes Yes Yes    Intervention Provide advice, education, support and counseling about physical activity/exercise needs.;Develop an individualized exercise prescription for aerobic and resistive training based on initial evaluation findings, risk stratification, comorbidities and participant's personal goals. Provide advice, education, support and counseling about physical activity/exercise needs.;Develop an individualized exercise prescription for aerobic and resistive training based on initial evaluation findings, risk stratification, comorbidities and participant's personal goals. Provide advice, education, support and counseling about physical activity/exercise needs.;Develop an individualized exercise prescription for aerobic and resistive training based on initial evaluation findings, risk stratification, comorbidities and participant's personal goals. Provide advice, education, support and counseling about physical activity/exercise needs.;Develop an individualized exercise prescription for aerobic and resistive training based on initial evaluation findings, risk stratification, comorbidities and participant's personal goals.    Expected Outcomes Short Term: Increase workloads from initial exercise prescription for resistance, speed, and METs.;Short Term: Perform resistance training exercises routinely during rehab and add in resistance training at home;Long Term: Improve cardiorespiratory fitness, muscular endurance and strength as measured by increased METs and functional capacity ( ) Short Term: Increase workloads from initial exercise prescription for resistance, speed, and METs.;Short Term: Perform resistance training exercises routinely during rehab and add in resistance training at home;Long Term: Improve cardiorespiratory fitness, muscular endurance and strength as measured by  increased METs and functional capacity ( ) Short Term: Increase workloads from initial exercise prescription for resistance, speed, and METs.;Short Term: Perform resistance training exercises routinely during rehab and add in resistance training at home;Long Term: Improve cardiorespiratory fitness, muscular endurance and strength as measured by increased METs and functional capacity ( ) Short Term: Increase workloads from initial exercise prescription for resistance, speed, and METs.;Short Term: Perform resistance training exercises routinely during rehab and add in resistance training at home;Long Term: Improve cardiorespiratory fitness, muscular endurance and strength as measured by increased METs and functional capacity ( )    Able to understand and use rate of perceived exertion (RPE) scale Yes Yes Yes Yes    Intervention Provide education and explanation on how to use RPE scale Provide education and explanation on how to use RPE scale Provide education and explanation on how to use RPE scale Provide education and explanation on how to use RPE scale    Expected Outcomes Short Term: Able to use RPE daily in rehab to express subjective intensity level;Long Term:  Able to use RPE to guide intensity level when exercising independently Short Term: Able to use RPE daily in rehab to express subjective intensity level;Long Term:  Able to use RPE to guide intensity level when exercising independently Short Term: Able to use RPE daily in rehab to express subjective intensity level;Long Term:  Able to use RPE to guide intensity level when exercising independently Short Term: Able to use RPE daily in rehab to express subjective intensity level;Long Term:  Able to use RPE to guide intensity level when exercising independently    Able to understand and use Dyspnea scale Yes Yes Yes Yes    Intervention Provide education and explanation on how to use Dyspnea scale Provide education and explanation on how to use  Dyspnea scale Provide education and explanation on how to use Dyspnea scale Provide education and  explanation on how to use Dyspnea scale    Expected Outcomes Short Term: Able to use Dyspnea scale daily in rehab to express subjective sense of shortness of breath during exertion;Long Term: Able to use Dyspnea scale to guide intensity level when exercising independently Short Term: Able to use Dyspnea scale daily in rehab to express subjective sense of shortness of breath during exertion;Long Term: Able to use Dyspnea scale to guide intensity level when exercising independently Short Term: Able to use Dyspnea scale daily in rehab to express subjective sense of shortness of breath during exertion;Long Term: Able to use Dyspnea scale to guide intensity level when exercising independently Short Term: Able to use Dyspnea scale daily in rehab to express subjective sense of shortness of breath during exertion;Long Term: Able to use Dyspnea scale to guide intensity level when exercising independently    Knowledge and understanding of Target Heart Rate Range (THRR) Yes Yes Yes Yes    Intervention Provide education and explanation of THRR including how the numbers were predicted and where they are located for reference Provide education and explanation of THRR including how the numbers were predicted and where they are located for reference Provide education and explanation of THRR including how the numbers were predicted and where they are located for reference Provide education and explanation of THRR including how the numbers were predicted and where they are located for reference    Expected Outcomes Short Term: Able to state/look up THRR;Long Term: Able to use THRR to govern intensity when exercising independently;Short Term: Able to use daily as guideline for intensity in rehab Short Term: Able to state/look up THRR;Long Term: Able to use THRR to govern intensity when exercising independently;Short Term: Able to use  daily as guideline for intensity in rehab Short Term: Able to state/look up THRR;Long Term: Able to use THRR to govern intensity when exercising independently;Short Term: Able to use daily as guideline for intensity in rehab Short Term: Able to state/look up THRR;Long Term: Able to use THRR to govern intensity when exercising independently;Short Term: Able to use daily as guideline for intensity in rehab    Understanding of Exercise Prescription Yes Yes Yes Yes    Intervention Provide education, explanation, and written materials on patient's individual exercise prescription Provide education, explanation, and written materials on patient's individual exercise prescription Provide education, explanation, and written materials on patient's individual exercise prescription Provide education, explanation, and written materials on patient's individual exercise prescription    Expected Outcomes Short Term: Able to explain program exercise prescription;Long Term: Able to explain home exercise prescription to exercise independently Short Term: Able to explain program exercise prescription;Long Term: Able to explain home exercise prescription to exercise independently Short Term: Able to explain program exercise prescription;Long Term: Able to explain home exercise prescription to exercise independently Short Term: Able to explain program exercise prescription;Long Term: Able to explain home exercise prescription to exercise independently             Exercise Goals Re-Evaluation :  Exercise Goals Re-Evaluation     Row Name 04/28/23 1052 05/19/23 1039 06/19/23 1228         Exercise Goal Re-Evaluation   Exercise Goals Review Increase Physical Activity;Able to understand and use Dyspnea scale;Understanding of Exercise Prescription;Increase Strength and Stamina;Knowledge and understanding of Target Heart Rate Range (THRR);Able to understand and use rate of perceived exertion (RPE) scale Increase Physical  Activity;Able to understand and use Dyspnea scale;Understanding of Exercise Prescription;Increase Strength and Stamina;Knowledge and understanding of Target Heart  Rate Range (THRR);Able to understand and use rate of perceived exertion (RPE) scale Increase Physical Activity;Able to understand and use Dyspnea scale;Understanding of Exercise Prescription;Increase Strength and Stamina;Knowledge and understanding of Target Heart Rate Range (THRR);Able to understand and use rate of perceived exertion (RPE) scale     Comments Skip is scheduled to begin exercise next week. Will continue to monitor and progress as able. Skip has completed 5 exercise sessions. He exercises for 15 min on the Nustep and track. Pt averages 2.7 METs at level 3 on the Nustep and 2.23 METs on the track. Skip performs the warmup and cooldown standing/ seated dependent on his shortness of breath. He has increased his workload for the Nustep as METs have slightly increased. It is too soon to notate any major progressions. Will continue to monitor and progress as able. Skip has completed 13 exercise sessions. He exercises for 15 min on the Nustep and track. Pt averages 3.4 METs at level 3 on the Nustep and 2.08 METs on the track. Skip performs the warmup and cooldown standing/ seated dependent on his shortness of breath. His METs have increased for the Nustep and have remained the same for the track. Skip has most likely reached his functional capacity. Skip sits less during the warmup and cooldown. He feels that he have improved since starting PR.     Expected Outcomes Through exercise at rehab and home, the patient will decrease shortness of breath with daily activities and feel confident in carrying out an exercise regimen at home. Through exercise at rehab and home, the patient will decrease shortness of breath with daily activities and feel confident in carrying out an exercise regimen at home. Through exercise at rehab and home, the patient  will decrease shortness of breath with daily activities and feel confident in carrying out an exercise regimen at home.              Discharge Exercise Prescription (Final Exercise Prescription Changes):  Exercise Prescription Changes - 06/27/23 0900       Response to Exercise   Blood Pressure (Admit) 114/68    Blood Pressure (Exercise) 124/58    Blood Pressure (Exit) 120/68    Heart Rate (Admit) 111 bpm    Heart Rate (Exercise) 124 bpm    Heart Rate (Exit) 95 bpm    Oxygen Saturation (Admit) 95 %    Oxygen Saturation (Exercise) 92 %    Oxygen Saturation (Exit) 97 %    Rating of Perceived Exertion (Exercise) 12    Perceived Dyspnea (Exercise) 1    Duration Continue with 30 min of aerobic exercise without signs/symptoms of physical distress.    Intensity THRR unchanged      Progression   Progression Continue to progress workloads to maintain intensity without signs/symptoms of physical distress.      Resistance Training   Training Prescription Yes    Weight red bands    Reps 10-15    Time 10 Minutes      NuStep   Level 3    Minutes 15    METs 3      Track   Laps 5    Minutes 15    METs 1.77             Nutrition:  Target Goals: Understanding of nutrition guidelines, daily intake of sodium 1500mg , cholesterol 200mg , calories 30% from fat and 7% or less from saturated fats, daily to have 5 or more servings of fruits and vegetables.  Biometrics:  Pre Biometrics - 04/24/23 0914       Pre Biometrics   Grip Strength 29 kg              Nutrition Therapy Plan and Nutrition Goals:  Nutrition Therapy & Goals - 05/30/23 1106       Nutrition Therapy   Diet Heart Healthy Diet    Drug/Food Interactions Statins/Certain Fruits      Personal Nutrition Goals   Nutrition Goal Patient to identify strategies for weight maintenance/ weight gain of 0.5-2.0# per week.   goal in progress.   Comments Goals in progress. Skip continues regular follow-up for kidney  transplant; he has required dialysis since heart transplant in 2021. He currently receives hemodialysis M/W/F. His weight has been stable since hospitalization related to pneumonia in May 2024, and per diaylsis notes, his typical weight has been ~60kg. He is down about 4.6# since orientation to our program (start weight was 61.1kg). He lives with his sister who is a great support and does all of the grocery shopping and cooking. Skip reports drinking 1 Boost Breeze daily (250kcals, 9g protein) and 1 [employer]fil[employer] Milkshake (580kcals, 13g protein) daily to aid with weight gain/weight maintenance. We have discussed multiple strategies for weight gain including increasing eating frequency, protein supplements, increasing calories from fat/protein/carbohydrates, etc. Patient will benefit from participation in pulmonary rehab for nutrition, exercise, and lifestyle modification.      Intervention Plan   Intervention Prescribe, educate and counsel regarding individualized specific dietary modifications aiming towards targeted core components such as weight, hypertension, lipid management, diabetes, heart failure and other comorbidities.;Nutrition handout(s) given to patient.    Expected Outcomes Long Term Goal: Adherence to prescribed nutrition plan.;Short Term Goal: Understand basic principles of dietary content, such as calories, fat, sodium, cholesterol and nutrients.             Nutrition Assessments:  Nutrition Assessments - 05/02/23 1113       Rate Your Plate Scores   Pre Score 62            MEDIFICTS Score Key: ?70 Need to make dietary changes  40-70 Heart Healthy Diet ? 40 Therapeutic Level Cholesterol Diet  Flowsheet Row PULMONARY REHAB OTHER RESPIRATORY from 05/02/2023 in [facility] for Heart, Vascular, & Lung Health  Picture Your Plate Total Score on Admission 62      Picture Your Plate Scores: <19 Unhealthy dietary pattern with much room for  improvement. 41-50 Dietary pattern unlikely to meet recommendations for good health and room for improvement. 51-60 More healthful dietary pattern, with some room for improvement.  >60 Healthy dietary pattern, although there may be some specific behaviors that could be improved.    Nutrition Goals Re-Evaluation:  Nutrition Goals Re-Evaluation     Row Name 05/02/23 1010 05/30/23 1106           Goals   Current Weight 134 lb 11.2 oz (61.1 kg) 132 lb 0.9 oz (59.9 kg)      Comment GFR 11, Cr 5.5, PTH 194, triglycerides 161, HDL 34, Albumin 2.3 he continues regular follow-up with [facility] healthcare s/p heart transplant. Other most recent labs  GFR 11, Cr 5.5, PTH 194, triglycerides 161, HDL 34, Albumin 2.3      Expected Outcome Skip continues regular follow-up for kidney transplant; he has required dialysis since heart transplant in 2021. He currently receives hemodialysis M/W/F. His weight has been stable since hospitalization related to pneumonia in May 2024, and per diaylsis notes, his  typical weight has been ~60kg. He lives with his sister who is a great support and does all of the grocery shopping and cooking. Skip reports drinking 1 Boost Breeze daily (250kcals, 9g protein) and 1 Chick-fil-a Milkshake (580kcals, 13g protein) daily to aid with weight gain/weight maintenance. Patient will benefit from participation in pulmonary rehab for nutrition, exercise, and lifestyle modification. Goals in progress. Skip continues regular follow-up for kidney transplant; he has required dialysis since heart transplant in 2021. He currently receives hemodialysis M/W/F. His weight has been stable since hospitalization related to pneumonia in May 2024, and per diaylsis notes, his typical weight has been ~60kg. He is down about 4.6# since orientation to our program (start weight was 61.1kg). He lives with his sister who is a great support and does all of the grocery shopping and cooking. Skip reports drinking 1 Boost  Breeze daily (250kcals, 9g protein) and 1 Chick-fil-a Milkshake (580kcals, 13g protein) daily to aid with weight gain/weight maintenance. We have discussed multiple strategies for weight gain including increasing eating frequency, protein supplements, increasing calories from fat/protein/carbohydrates, etc. Patient will benefit from participation in pulmonary rehab for nutrition, exercise, and lifestyle modification.               Nutrition Goals Discharge (Final Nutrition Goals Re-Evaluation):  Nutrition Goals Re-Evaluation - 05/30/23 1106       Goals   Current Weight 132 lb 0.9 oz (59.9 kg)    Comment he continues regular follow-up with [facility] healthcare s/p heart transplant. Other most recent labs  GFR 11, Cr 5.5, PTH 194, triglycerides 161, HDL 34, Albumin 2.3    Expected Outcome Goals in progress. Skip continues regular follow-up for kidney transplant; he has required dialysis since heart transplant in 2021. He currently receives hemodialysis M/W/F. His weight has been stable since hospitalization related to pneumonia in May 2024, and per diaylsis notes, his typical weight has been ~60kg. He is down about 4.6# since orientation to our program (start weight was 61.1kg). He lives with his sister who is a great support and does all of the grocery shopping and cooking. Skip reports drinking 1 Boost Breeze daily (250kcals, 9g protein) and 1 Chick-fil-a Milkshake (580kcals, 13g protein) daily to aid with weight gain/weight maintenance. We have discussed multiple strategies for weight gain including increasing eating frequency, protein supplements, increasing calories from fat/protein/carbohydrates, etc. Patient will benefit from participation in pulmonary rehab for nutrition, exercise, and lifestyle modification.             Psychosocial: Target Goals: Acknowledge presence or absence of significant depression and/or stress, maximize coping skills, provide positive support system. Participant is  able to verbalize types and ability to use techniques and skills needed for reducing stress and depression.  Initial Review & Psychosocial Screening:  Initial Psych Review & Screening - 04/24/23 0923       Initial Review   Current issues with None Identified      Family Dynamics   Good Support System? Yes    Comments sister and other family members      Barriers   Psychosocial barriers to participate in program There are no identifiable barriers or psychosocial needs.      Screening Interventions   Interventions Encouraged to exercise             Quality of Life Scores:  Scores of 19 and below usually indicate a poorer quality of life in these areas.  A difference of  2-3 points is a clinically meaningful difference.  A difference of 2-3 points in the total score of the Quality of Life Index has been associated with significant improvement in overall quality of life, self-image, physical symptoms, and general health in studies assessing change in quality of life.  PHQ-9: Review Flowsheet       04/24/2023  Depression screen PHQ 2/9  Decreased Interest 0  Down, Depressed, Hopeless 0  PHQ - 2 Score 0  Altered sleeping 0  Tired, decreased energy 0  Change in appetite 0  Feeling bad or failure about yourself  0  Trouble concentrating 0  Moving slowly or fidgety/restless 0  Suicidal thoughts 0  PHQ-9 Score 0  Difficult doing work/chores Not difficult at all    Details           Interpretation of Total Score  Total Score Depression Severity:  1-4 = Minimal depression, 5-9 = Mild depression, 10-14 = Moderate depression, 15-19 = Moderately severe depression, 20-27 = Severe depression   Psychosocial Evaluation and Intervention:  Psychosocial Evaluation - 04/24/23 0923       Psychosocial Evaluation & Interventions   Interventions Encouraged to exercise with the program and follow exercise prescription    Comments Skip denies any psychosocial barriers to exercise.     Expected Outcomes For Skip to participate in PR free of psychosocial barriers.    Continue Psychosocial Services  No Follow up required             Psychosocial Re-Evaluation:  Psychosocial Re-Evaluation     Row Name 04/26/23 1550 05/24/23 1223 06/19/23 1210         Psychosocial Re-Evaluation   Current issues with None Identified None Identified None Identified     Comments No change in psychosocial assessment since orientation 04/24/2023. No concerns identified No new psychosocial barriers or concerns at this time. Skip is enjoying PR and denies any stressors. He states that his mental health is doing "fine". Skip states that his sister does a good job taking care of him and monitoring his overnight oxygen saturations. He is tolerating his HD sessions on T, Th, Sat well. He stated he is getting worked up for a kidney transplant. Skip stated he is looking forward to traveling and going to baseball games with his sister. Skip denies any needs at this time.     Expected Outcomes For Skip to attend PR without any barriers or concerns For Skip to attend PR without any barriers or concerns For Skip to attend PR without any barriers or concerns     Interventions Encouraged to attend Pulmonary Rehabilitation for the exercise Encouraged to attend Pulmonary Rehabilitation for the exercise Encouraged to attend Pulmonary Rehabilitation for the exercise     Continue Psychosocial Services  No Follow up required No Follow up required No Follow up required              Psychosocial Discharge (Final Psychosocial Re-Evaluation):  Psychosocial Re-Evaluation - 06/19/23 1210       Psychosocial Re-Evaluation   Current issues with None Identified    Comments Skip is enjoying PR and denies any stressors. He states that his mental health is doing "fine". Skip states that his sister does a good job taking care of him and monitoring his overnight oxygen saturations. He is tolerating his HD sessions on T,  Th, Sat well. He stated he is getting worked up for a kidney transplant. Skip stated he is looking forward to traveling and going to baseball games with his sister. Skip denies any  needs at this time.    Expected Outcomes For Skip to attend PR without any barriers or concerns    Interventions Encouraged to attend Pulmonary Rehabilitation for the exercise    Continue Psychosocial Services  No Follow up required             Education: Education Goals: Education classes will be provided on a weekly basis, covering required topics. Participant will state understanding/return demonstration of topics presented.  Learning Barriers/Preferences:  Learning Barriers/Preferences - 04/24/23 0924       Learning Barriers/Preferences   Learning Barriers Sight    Learning Preferences Skilled Demonstration             Education Topics: Introduction to Pulmonary Rehab Group instruction provided by PowerPoint, verbal discussion, and written material to support subject matter. Instructor reviews what Pulmonary Rehab is, the purpose of the program, and how patients are referred.     Know Your Numbers Group instruction that is supported by a PowerPoint presentation. Instructor discusses importance of knowing and understanding resting, exercise, and post-exercise oxygen saturation, heart rate, and blood pressure. Oxygen saturation, heart rate, blood pressure, rating of perceived exertion, and dyspnea are reviewed along with a normal range for these values.  Flowsheet Row PULMONARY REHAB OTHER RESPIRATORY from 05/04/2023 in [facility] for Heart, Vascular, & Lung Health  Date 05/04/23  Educator EP  Instruction Review Code 1- Verbalizes Understanding       Exercise for the Pulmonary Patient Group instruction that is supported by a PowerPoint presentation. Instructor discusses benefits of exercise, core components of exercise, frequency, duration, and intensity of an exercise  routine, importance of utilizing pulse oximetry during exercise, safety while exercising, and options of places to exercise outside of rehab.       MET Level  Group instruction provided by PowerPoint, verbal discussion, and written material to support subject matter. Instructor reviews what METs are and how to increase METs.  Flowsheet Row PULMONARY REHAB OTHER RESPIRATORY from 06/22/2023 in [facility] for Heart, Vascular, & Lung Health  Date 06/22/23  Educator EP  Instruction Review Code 1- Verbalizes Understanding       Pulmonary Medications Verbally interactive group education provided by instructor with focus on inhaled medications and proper administration.   Anatomy and Physiology of the Respiratory System Group instruction provided by PowerPoint, verbal discussion, and written material to support subject matter. Instructor reviews respiratory cycle and anatomical components of the respiratory system and their functions. Instructor also reviews differences in obstructive and restrictive respiratory diseases with examples of each.    Oxygen Safety Group instruction provided by PowerPoint, verbal discussion, and written material to support subject matter. There is an overview of "What is Oxygen" and "Why do we need it".  Instructor also reviews how to create a safe environment for oxygen use, the importance of using oxygen as prescribed, and the risks of noncompliance. There is a brief discussion on traveling with oxygen and resources the patient may utilize. Flowsheet Row PULMONARY REHAB OTHER RESPIRATORY from 05/11/2023 in [facility] for Heart, Vascular, & Lung Health  Date 05/11/23  Educator RN  Instruction Review Code 1- Verbalizes Understanding       Oxygen Use Group instruction provided by PowerPoint, verbal discussion, and written material to discuss how supplemental oxygen is prescribed and different types of oxygen supply  systems. Resources for more information are provided.    Breathing Techniques Group instruction that is supported by demonstration and  informational handouts. Instructor discusses the benefits of pursed lip and diaphragmatic breathing and detailed demonstration on how to perform both.  Flowsheet Row PULMONARY REHAB OTHER RESPIRATORY from 05/25/2023 in [facility] for Heart, Vascular, & Lung Health  Date 05/25/23  Educator RN  Instruction Review Code 1- Verbalizes Understanding        Risk Factor Reduction Group instruction that is supported by a PowerPoint presentation. Instructor discusses the definition of a risk factor, different risk factors for pulmonary disease, and how the heart and lungs work together. Flowsheet Row PULMONARY REHAB OTHER RESPIRATORY from 06/15/2023 in [facility] for Heart, Vascular, & Lung Health  Date 06/15/23  Educator EP  Instruction Review Code 1- Verbalizes Understanding       MD Day A group question and answer session with a medical doctor that allows participants to ask questions that relate to their pulmonary disease state.   Nutrition for the Pulmonary Patient Group instruction provided by PowerPoint slides, verbal discussion, and written materials to support subject matter. The instructor gives an explanation and review of healthy diet recommendations, which includes a discussion on weight management, recommendations for fruit and vegetable consumption, as well as protein, fluid, caffeine, fiber, sodium, sugar, and alcohol. Tips for eating when patients are short of breath are discussed.    Other Education Group or individual verbal, written, or video instructions that support the educational goals of the pulmonary rehab program. Flowsheet Row PULMONARY REHAB OTHER RESPIRATORY from 06/08/2023 in [facility] for Heart, Vascular, & Lung Health  Date 06/08/23  Educator EP   Instruction Review Code 1- Verbalizes Understanding        Knowledge Questionnaire Score:  Knowledge Questionnaire Score - 04/24/23 6578       Knowledge Questionnaire Score   Pre Score 18/18             Core Components/Risk Factors/Patient Goals at Admission:  Personal Goals and Risk Factors at Admission - 04/24/23 0928       Core Components/Risk Factors/Patient Goals on Admission    Weight Management Yes;Weight Gain    Intervention Weight Management: Develop a combined nutrition and exercise program designed to reach desired caloric intake, while maintaining appropriate intake of nutrient and fiber, sodium and fats, and appropriate energy expenditure required for the weight goal.;Weight Management: Provide education and appropriate resources to help participant work on and attain dietary goals.    Expected Outcomes Weight Gain: Understanding of general recommendations for a high calorie, high protein meal plan that promotes weight gain by distributing calorie intake throughout the day with the consumption for 4-5 meals, snacks, and/or supplements;Understanding of distribution of calorie intake throughout the day with the consumption of 4-5 meals/snacks;Understanding recommendations for meals to include 15-35% energy as protein, 25-35% energy from fat, 35-60% energy from carbohydrates, less than 200mg  of dietary cholesterol, 20-35 gm of total fiber daily;Short Term: Continue to assess and modify interventions until short term weight is achieved;Long Term: Adherence to nutrition and physical activity/exercise program aimed toward attainment of established weight goal    Improve shortness of breath with ADL's Yes    Intervention Provide education, individualized exercise plan and daily activity instruction to help decrease symptoms of SOB with activities of daily living.    Expected Outcomes Short Term: Improve cardiorespiratory fitness to achieve a reduction of symptoms when performing  ADLs;Long Term: Be able to perform more ADLs without symptoms or delay the onset of symptoms  Core Components/Risk Factors/Patient Goals Review:   Goals and Risk Factor Review     Row Name 04/26/23 1552 05/24/23 1226 06/19/23 1244         Core Components/Risk Factors/Patient Goals Review   Personal Goals Review Weight Management/Obesity;Develop more efficient breathing techniques such as purse lipped breathing and diaphragmatic breathing and practicing self-pacing with activity.;Improve shortness of breath with ADL's Weight Management/Obesity;Develop more efficient breathing techniques such as purse lipped breathing and diaphragmatic breathing and practicing self-pacing with activity.;Improve shortness of breath with ADL's Weight Management/Obesity;Develop more efficient breathing techniques such as purse lipped breathing and diaphragmatic breathing and practicing self-pacing with activity.;Improve shortness of breath with ADL's     Review Unable to evaluate any progress towards goals yet. Skip is scheduled to begin on 7/9. Goal progressing for weight gain. Skip is working with staff dietician to obtain weight gain goal. Goal progressing for improve shortness of breath with ADL's. Goal progressing on developing more efficient breathing techniques such as purse lipped breathing and diaphragmatic breathing. Goal not progressing for weight gain. Skip has not gained any weight since starting the program. Skip and his sister are working with our staff dietician to gain weight. He is eating a renal diet. Goal progressing for improving shortness of breath with ADL's. We have weaned Skip off his oxygen since starting the program. He still uses oxygen at night. O2 saturations have been > 88% while exercising. Goal met on developing more efficient breathing techniques such as purse lipped breathing and diaphragmatic breathing. Skip can initiate PLB on when he is short of breath while exercising. He  states he has been practicing diaphragmatic breathing at home. Skip is enjoying the class and stated he can see the progress.     Expected Outcomes For Skip to gain weight, improve his shortness of breath with ADLs, and develop more efficient breathing techniques. See admission goals For Skip to gain weight, improve his shortness of breath with his ADLs & develop more efficient breathing techniques              Core Components/Risk Factors/Patient Goals at Discharge (Final Review):   Goals and Risk Factor Review - 06/19/23 1244       Core Components/Risk Factors/Patient Goals Review   Personal Goals Review Weight Management/Obesity;Develop more efficient breathing techniques such as purse lipped breathing and diaphragmatic breathing and practicing self-pacing with activity.;Improve shortness of breath with ADL's    Review Goal not progressing for weight gain. Skip has not gained any weight since starting the program. Skip and his sister are working with our staff dietician to gain weight. He is eating a renal diet. Goal progressing for improving shortness of breath with ADL's. We have weaned Skip off his oxygen since starting the program. He still uses oxygen at night. O2 saturations have been > 88% while exercising. Goal met on developing more efficient breathing techniques such as purse lipped breathing and diaphragmatic breathing. Skip can initiate PLB on when he is short of breath while exercising. He states he has been practicing diaphragmatic breathing at home. Skip is enjoying the class and stated he can see the progress.    Expected Outcomes For Skip to gain weight, improve his shortness of breath with his ADLs & develop more efficient breathing techniques             ITP Comments:Pt is making expected progress toward Pulmonary Rehab goals after completing 16 sessions. Recommend continued exercise, life style modification, education, and utilization of breathing techniques to  increase  stamina and strength, while also decreasing shortness of breath with exertion.  Dr. Mechele Collin is Medical Director for Pulmonary Rehab at [facility].     Comments: Dr. Mechele Collin is Medical Director for Pulmonary Rehab at [facility].

## 2023-06-29 ENCOUNTER — Encounter (HOSPITAL_COMMUNITY)
Admission: RE | Admit: 2023-06-29 | Discharge: 2023-06-29 | Disposition: A | Discharge status: Home / Self Care | Payer: Medicare Other | Source: Ambulatory Visit | Attending: Pulmonary Disease | Admitting: Pulmonary Disease

## 2023-06-29 DIAGNOSIS — J9600 Acute respiratory failure, unspecified whether with hypoxia or hypercapnia: Secondary | ICD-10-CM | POA: Diagnosis not present

## 2023-06-29 NOTE — Progress Notes (Addendum)
Daily Session Note  Patient Details  Name: Rickey Erickson MRN: [id number] Date of Birth: 11-Dec-1955 Referring Provider:   [name] Pulmonary Rehab Walk Test from 04/24/2023 in [facility]  Referring Provider [name]       Encounter Date: 06/29/2023  Check In:  Session Check In - 06/29/23 0859       Check-In   Supervising physician immediately available to respond to emergencies [facility] MD immediately available    Physician(s) Metta Clines, NP    Location [facility]    Staff Present Raford Pitcher, MS, ACSM-CEP, Exercise Physiologist;Casey Synthia Innocent, RN, BSN;Randi Reeve BS, ACSM-CEP, Exercise Physiologist    Virtual Visit No    Medication changes reported     No    Fall or balance concerns reported    No    Tobacco Cessation No Change    Warm-up and Cool-down Performed as group-led instruction    Resistance Training Performed Yes    VAD Patient? No    PAD/SET Patient? No      Pain Assessment   Currently in Pain? No/denies    Multiple Pain Sites No             Capillary Blood Glucose: No results found for this or any previous visit (from the past 24 hour(s)).    Social History   Tobacco Use  Smoking Status Never  Smokeless Tobacco Never    Goals Met:  Independence with exercise equipment Exercise tolerated well No report of concerns or symptoms today Strength training completed today  Goals Unmet:  Increased SOB, pt stated they didn't take off a lot of fluid in HD yesterday  Comments: Service time is from 0824 to 0946    Dr. Mechele Collin is Medical Director for Pulmonary Rehab at [facility]

## 2023-07-04 ENCOUNTER — Encounter (HOSPITAL_COMMUNITY): Payer: Medicare Other

## 2023-07-04 ENCOUNTER — Ambulatory Visit: Admit: 2023-07-04 | Discharge: 2023-07-05 | Payer: MEDICARE

## 2023-07-04 ENCOUNTER — Ambulatory Visit: Admit: 2023-07-04 | Discharge: 2023-07-06 | Payer: MEDICARE

## 2023-07-04 ENCOUNTER — Ambulatory Visit: Admit: 2023-07-04 | Discharge: 2023-07-04 | Payer: MEDICARE

## 2023-07-05 DIAGNOSIS — J90 Pleural effusion, not elsewhere classified: Principal | ICD-10-CM

## 2023-07-06 ENCOUNTER — Encounter (HOSPITAL_COMMUNITY)
Admission: RE | Admit: 2023-07-06 | Discharge: 2023-07-06 | Disposition: A | Discharge status: Home / Self Care | Payer: Medicare Other | Source: Ambulatory Visit | Attending: Pulmonary Disease | Admitting: Pulmonary Disease

## 2023-07-06 VITALS — Wt 131.8 lb

## 2023-07-06 DIAGNOSIS — J9600 Acute respiratory failure, unspecified whether with hypoxia or hypercapnia: Secondary | ICD-10-CM

## 2023-07-06 NOTE — Progress Notes (Signed)
Daily Session Note  Patient Details  Name: Rickey Erickson MRN: [id number] Date of Birth: 1956-04-18 Referring Provider:   [name] Pulmonary Rehab Walk Test from 04/24/2023 in [facility] for [facility], V[facility], [facility]  Referring Provider [name]       Encounter Date: 07/06/2023  Check In:  Session Check In - 07/06/23 0858       Check-In   Supervising physician immediately available to respond to emergencies [facility] MD immediately available    Physician(s) Edd Fabian, NP    Location [facility]    Staff Present Essie Hart, RN, BSN;Randi Idelle Crouch BS, ACSM-CEP, Exercise Physiologist;Yuma Blucher Earlene Plater, MS, ACSM-CEP, Exercise Physiologist;Casey Katrinka Blazing, RT    Virtual Visit No    Medication changes reported     No    Fall or balance concerns reported    No    Tobacco Cessation No Change    Warm-up and Cool-down Performed as group-led instruction    Resistance Training Performed Yes    VAD Patient? No    PAD/SET Patient? No      Pain Assessment   Currently in Pain? No/denies    Multiple Pain Sites No             Capillary Blood Glucose: No results found for this or any previous visit (from the past 24 hour(s)).    Social History   Tobacco Use  Smoking Status Never  Smokeless Tobacco Never    Goals Met:  Proper associated with RPD/PD & O2 Sat Independence with exercise equipment Exercise tolerated well No report of concerns or symptoms today Strength training completed today  Goals Unmet:  Not Applicable  Comments: Service time is from 0805 to [phone].    Dr. Mechele Collin is Medical Director for Pulmonary Rehab at [facility].

## 2023-07-07 ENCOUNTER — Ambulatory Visit: Admit: 2023-07-07 | Discharge: 2023-07-08 | Payer: MEDICARE

## 2023-07-11 ENCOUNTER — Telehealth (HOSPITAL_COMMUNITY): Payer: Self-pay | Admitting: *Deleted

## 2023-07-11 ENCOUNTER — Encounter (HOSPITAL_COMMUNITY): Payer: Medicare Other

## 2023-07-11 NOTE — Telephone Encounter (Signed)
Mr. Rickey Erickson left a message on the department voicemail this morning. His breathing hasn't improved to the point that he feels he can participate in pulmonary rehab this morning, so he will be out today. He is scheduled for a thoracentesis on Thursday morning, therefore will be absent then as well. Will forward message to pulmonary rehab staff.

## 2023-07-13 ENCOUNTER — Encounter (HOSPITAL_COMMUNITY): Payer: Medicare Other

## 2023-07-13 ENCOUNTER — Ambulatory Visit: Admit: 2023-07-13 | Discharge: 2023-07-13 | Payer: MEDICARE

## 2023-07-18 ENCOUNTER — Encounter (HOSPITAL_COMMUNITY)
Admission: RE | Admit: 2023-07-18 | Discharge: 2023-07-18 | Disposition: A | Discharge status: Home / Self Care | Payer: Medicare Other | Source: Ambulatory Visit | Attending: Pulmonary Disease | Admitting: Pulmonary Disease

## 2023-07-18 DIAGNOSIS — J9600 Acute respiratory failure, unspecified whether with hypoxia or hypercapnia: Secondary | ICD-10-CM

## 2023-07-18 DIAGNOSIS — R0602 Shortness of breath: Secondary | ICD-10-CM | POA: Diagnosis not present

## 2023-07-18 DIAGNOSIS — A419 Sepsis, unspecified organism: Secondary | ICD-10-CM | POA: Diagnosis not present

## 2023-07-18 NOTE — Progress Notes (Signed)
Daily Session Note  Patient Details  Name: Rickey Erickson MRN: [id number] Date of Birth: July 08, 1956 Referring Provider:   [name] Pulmonary Rehab Walk Test from 04/24/2023 in [facility] for [facility], Vascular, [facility] Lung [facility]  Referring Provider [name]       Encounter Date: 07/18/2023  Check In:  Session Check In - 07/18/23 0843       Check-In   Supervising physician immediately available to respond to emergencies [facility] MD immediately available    Physician(s) Carlyon Shadow, NP    Location [facility]    Staff Present Essie Hart, RN, BSN;Randi Idelle Crouch BS, ACSM-CEP, Exercise Physiologist;Shay Jhaveri Earlene Plater, MS, ACSM-CEP, Exercise Physiologist;Casey Katrinka Blazing, RT    Virtual Visit No    Medication changes reported     No    Fall or balance concerns reported    No    Tobacco Cessation No Change    Warm-up and Cool-down Performed as group-led instruction    Resistance Training Performed Yes    VAD Patient? No    PAD/SET Patient? No      Pain Assessment   Currently in Pain? No/denies    Multiple Pain Sites No             Capillary Blood Glucose: No results found for this or any previous visit (from the past 24 hour(s)).    Social History   Tobacco Use  Smoking Status Never  Smokeless Tobacco Never    Goals Met:  Proper associated with RPD/PD & O2 Sat Independence with exercise equipment Exercise tolerated well No report of concerns or symptoms today Strength training completed today  Goals Unmet:  Not Applicable  Comments: Service time is from 0812 to 0933.    Dr. Mechele Collin is Medical Director for Pulmonary Rehab at [facility].

## 2023-07-20 ENCOUNTER — Encounter (HOSPITAL_COMMUNITY): Payer: Medicare Other

## 2023-07-20 ENCOUNTER — Telehealth (HOSPITAL_COMMUNITY): Payer: Self-pay | Admitting: *Deleted

## 2023-07-20 DIAGNOSIS — R0602 Shortness of breath: Principal | ICD-10-CM

## 2023-07-20 NOTE — Telephone Encounter (Signed)
Message left on departmental voice mail for  pulmonary rehab by the patients sister, Clydie Braun.  Skip will be out today for exercise, felt weak after dialysis and did not rest well last night.  Felt he should hold off on exercise and hopes to return on Tuesday.Pulmonary rehab staff made aware. Alanson Aly, BSN Cardiac and Emergency planning/management officer

## 2023-07-21 ENCOUNTER — Other Ambulatory Visit: Payer: Self-pay

## 2023-07-21 ENCOUNTER — Inpatient Hospital Stay (HOSPITAL_COMMUNITY)
Admission: EM | Admit: 2023-07-21 | Discharge: 2023-07-27 | DRG: 871 | Disposition: A | Discharge status: Other Acute Inpatient Hospital | Payer: Medicare Other | Attending: Internal Medicine | Admitting: Internal Medicine

## 2023-07-21 ENCOUNTER — Emergency Department (HOSPITAL_COMMUNITY): Payer: Medicare Other

## 2023-07-21 DIAGNOSIS — J9601 Acute respiratory failure with hypoxia: Secondary | ICD-10-CM | POA: Diagnosis present

## 2023-07-21 DIAGNOSIS — G4700 Insomnia, unspecified: Secondary | ICD-10-CM | POA: Diagnosis present

## 2023-07-21 DIAGNOSIS — Z992 Dependence on renal dialysis: Secondary | ICD-10-CM | POA: Diagnosis not present

## 2023-07-21 DIAGNOSIS — M810 Age-related osteoporosis without current pathological fracture: Secondary | ICD-10-CM | POA: Diagnosis present

## 2023-07-21 DIAGNOSIS — J90 Pleural effusion, not elsewhere classified: Secondary | ICD-10-CM | POA: Diagnosis present

## 2023-07-21 DIAGNOSIS — R250 Abnormal head movements: Secondary | ICD-10-CM | POA: Diagnosis not present

## 2023-07-21 DIAGNOSIS — G2571 Drug induced akathisia: Secondary | ICD-10-CM | POA: Diagnosis not present

## 2023-07-21 DIAGNOSIS — Z941 Heart transplant status: Secondary | ICD-10-CM | POA: Diagnosis not present

## 2023-07-21 DIAGNOSIS — Z1152 Encounter for screening for COVID-19: Secondary | ICD-10-CM

## 2023-07-21 DIAGNOSIS — I120 Hypertensive chronic kidney disease with stage 5 chronic kidney disease or end stage renal disease: Secondary | ICD-10-CM | POA: Diagnosis present

## 2023-07-21 DIAGNOSIS — R0602 Shortness of breath: Secondary | ICD-10-CM | POA: Diagnosis present

## 2023-07-21 DIAGNOSIS — J918 Pleural effusion in other conditions classified elsewhere: Secondary | ICD-10-CM | POA: Diagnosis present

## 2023-07-21 DIAGNOSIS — R6521 Severe sepsis with septic shock: Secondary | ICD-10-CM | POA: Diagnosis present

## 2023-07-21 DIAGNOSIS — J9611 Chronic respiratory failure with hypoxia: Secondary | ICD-10-CM | POA: Diagnosis present

## 2023-07-21 DIAGNOSIS — D84821 Immunodeficiency due to drugs: Secondary | ICD-10-CM | POA: Diagnosis present

## 2023-07-21 DIAGNOSIS — N2581 Secondary hyperparathyroidism of renal origin: Secondary | ICD-10-CM | POA: Diagnosis present

## 2023-07-21 DIAGNOSIS — E8729 Other acidosis: Secondary | ICD-10-CM | POA: Diagnosis present

## 2023-07-21 DIAGNOSIS — A419 Sepsis, unspecified organism: Secondary | ICD-10-CM | POA: Diagnosis present

## 2023-07-21 DIAGNOSIS — D631 Anemia in chronic kidney disease: Secondary | ICD-10-CM | POA: Diagnosis present

## 2023-07-21 DIAGNOSIS — I252 Old myocardial infarction: Secondary | ICD-10-CM | POA: Diagnosis not present

## 2023-07-21 DIAGNOSIS — J9690 Respiratory failure, unspecified, unspecified whether with hypoxia or hypercapnia: Secondary | ICD-10-CM | POA: Diagnosis present

## 2023-07-21 DIAGNOSIS — F05 Delirium due to known physiological condition: Secondary | ICD-10-CM | POA: Diagnosis present

## 2023-07-21 DIAGNOSIS — I953 Hypotension of hemodialysis: Secondary | ICD-10-CM | POA: Diagnosis present

## 2023-07-21 DIAGNOSIS — N186 End stage renal disease: Secondary | ICD-10-CM | POA: Diagnosis present

## 2023-07-21 DIAGNOSIS — Z888 Allergy status to other drugs, medicaments and biological substances status: Secondary | ICD-10-CM

## 2023-07-21 DIAGNOSIS — Z79899 Other long term (current) drug therapy: Secondary | ICD-10-CM

## 2023-07-21 DIAGNOSIS — K5900 Constipation, unspecified: Secondary | ICD-10-CM | POA: Diagnosis not present

## 2023-07-21 DIAGNOSIS — E162 Hypoglycemia, unspecified: Secondary | ICD-10-CM | POA: Diagnosis present

## 2023-07-21 DIAGNOSIS — J189 Pneumonia, unspecified organism: Secondary | ICD-10-CM | POA: Diagnosis present

## 2023-07-21 DIAGNOSIS — J9602 Acute respiratory failure with hypercapnia: Secondary | ICD-10-CM | POA: Diagnosis present

## 2023-07-21 DIAGNOSIS — Z949 Transplanted organ and tissue status, unspecified: Secondary | ICD-10-CM | POA: Diagnosis not present

## 2023-07-21 DIAGNOSIS — G9341 Metabolic encephalopathy: Secondary | ICD-10-CM | POA: Diagnosis present

## 2023-07-21 DIAGNOSIS — Z79621 Long term (current) use of calcineurin inhibitor: Secondary | ICD-10-CM

## 2023-07-21 DIAGNOSIS — Z8701 Personal history of pneumonia (recurrent): Secondary | ICD-10-CM | POA: Diagnosis not present

## 2023-07-21 DIAGNOSIS — Z8679 Personal history of other diseases of the circulatory system: Secondary | ICD-10-CM

## 2023-07-21 LAB — HIV ANTIBODY (ROUTINE TESTING W REFLEX): HIV Screen 4th Generation wRfx: NONREACTIVE

## 2023-07-21 LAB — I-STAT VENOUS BLOOD GAS, ED
Acid-base deficit: 4 mmol/L — ABNORMAL HIGH (ref 0.0–2.0)
Acid-base deficit: 6 mmol/L — ABNORMAL HIGH (ref 0.0–2.0)
Bicarbonate: 27.4 mmol/L (ref 20.0–28.0)
Bicarbonate: 25 mmol/L (ref 20.0–28.0)
Calcium, Ion: 1.1 mmol/L — ABNORMAL LOW (ref 1.15–1.40)
Calcium, Ion: 1.07 mmol/L — ABNORMAL LOW (ref 1.15–1.40)
HCT: 46 % (ref 39.0–52.0)
HCT: 40 % (ref 39.0–52.0)
Hemoglobin: 15.6 g/dL (ref 13.0–17.0)
Hemoglobin: 13.6 g/dL (ref 13.0–17.0)
O2 Saturation: 86 %
O2 Saturation: 75 %
Potassium: 5.6 mmol/L — ABNORMAL HIGH (ref 3.5–5.1)
Potassium: 4.9 mmol/L (ref 3.5–5.1)
Sodium: 133 mmol/L — ABNORMAL LOW (ref 135–145)
Sodium: 137 mmol/L (ref 135–145)
TCO2: 30 mmol/L (ref 22–32)
TCO2: 27 mmol/L (ref 22–32)
pCO2, Ven: 78.1 mm[Hg] (ref 44–60)
pCO2, Ven: 79.8 mm[Hg] (ref 44–60)
pH, Ven: 7.154 — CL (ref 7.25–7.43)
pH, Ven: 7.104 — CL (ref 7.25–7.43)
pO2, Ven: 68 mm[Hg] — ABNORMAL HIGH (ref 32–45)
pO2, Ven: 56 mm[Hg] — ABNORMAL HIGH (ref 32–45)

## 2023-07-21 LAB — POCT I-STAT 7, (LYTES, BLD GAS, ICA,H+H)
Acid-base deficit: 5 mmol/L — ABNORMAL HIGH (ref 0.0–2.0)
Bicarbonate: 23.3 mmol/L (ref 20.0–28.0)
Calcium, Ion: 1.11 mmol/L — ABNORMAL LOW (ref 1.15–1.40)
HCT: 37 % — ABNORMAL LOW (ref 39.0–52.0)
Hemoglobin: 12.6 g/dL — ABNORMAL LOW (ref 13.0–17.0)
O2 Saturation: 93 %
Potassium: 4.3 mmol/L (ref 3.5–5.1)
Sodium: 137 mmol/L (ref 135–145)
TCO2: 25 mmol/L (ref 22–32)
pCO2 arterial: 56 mm[Hg] — ABNORMAL HIGH (ref 32–48)
pH, Arterial: 7.227 — ABNORMAL LOW (ref 7.35–7.45)
pO2, Arterial: 83 mm[Hg] (ref 83–108)

## 2023-07-21 LAB — RENAL FUNCTION PANEL
Albumin: 2.8 g/dL — ABNORMAL LOW (ref 3.5–5.0)
Anion gap: 11 (ref 5–15)
BUN: 44 mg/dL — ABNORMAL HIGH (ref 8–23)
CO2: 21 mmol/L — ABNORMAL LOW (ref 22–32)
Calcium: 7.7 mg/dL — ABNORMAL LOW (ref 8.9–10.3)
Chloride: 105 mmol/L (ref 98–111)
Creatinine, Ser: 6.83 mg/dL — ABNORMAL HIGH (ref 0.61–1.24)
GFR, Estimated: 8 mL/min — ABNORMAL LOW (ref >60)
Glucose, Bld: 68 mg/dL — ABNORMAL LOW (ref 70–99)
Phosphorus: 5.2 mg/dL — ABNORMAL HIGH (ref 2.5–4.6)
Potassium: 5.1 mmol/L (ref 3.5–5.1)
Sodium: 137 mmol/L (ref 135–145)

## 2023-07-21 LAB — CBC WITH DIFFERENTIAL/PLATELET
Abs Immature Granulocytes: 0.58 10*3/uL — ABNORMAL HIGH (ref 0.00–0.07)
Basophils Absolute: 0 10*3/uL (ref 0.0–0.1)
Basophils Relative: 0 %
Eosinophils Absolute: 0.1 10*3/uL (ref 0.0–0.5)
Eosinophils Relative: 1 %
HCT: 45.3 % (ref 39.0–52.0)
Hemoglobin: 12.7 g/dL — ABNORMAL LOW (ref 13.0–17.0)
Immature Granulocytes: 8 %
Lymphocytes Relative: 5 %
Lymphs Abs: 0.4 10*3/uL — ABNORMAL LOW (ref 0.7–4.0)
MCH: 28.1 pg (ref 26.0–34.0)
MCHC: 28 g/dL — ABNORMAL LOW (ref 30.0–36.0)
MCV: 100.2 fL — ABNORMAL HIGH (ref 80.0–100.0)
Monocytes Absolute: 0.9 10*3/uL (ref 0.1–1.0)
Monocytes Relative: 13 %
Neutro Abs: 5.3 10*3/uL (ref 1.7–7.7)
Neutrophils Relative %: 73 %
Platelets: 194 10*3/uL (ref 150–400)
RBC: 4.52 MIL/uL (ref 4.22–5.81)
RDW: 14.6 % (ref 11.5–15.5)
WBC: 7.2 10*3/uL (ref 4.0–10.5)
nRBC: 0 % (ref 0.0–0.2)

## 2023-07-21 LAB — I-STAT ARTERIAL BLOOD GAS, ED
Acid-base deficit: 3 mmol/L — ABNORMAL HIGH (ref 0.0–2.0)
Acid-base deficit: 4 mmol/L — ABNORMAL HIGH (ref 0.0–2.0)
Bicarbonate: 30.5 mmol/L — ABNORMAL HIGH (ref 20.0–28.0)
Bicarbonate: 27.6 mmol/L (ref 20.0–28.0)
Calcium, Ion: 1.21 mmol/L (ref 1.15–1.40)
Calcium, Ion: 1.15 mmol/L (ref 1.15–1.40)
HCT: 42 % (ref 39.0–52.0)
HCT: 38 % — ABNORMAL LOW (ref 39.0–52.0)
Hemoglobin: 14.3 g/dL (ref 13.0–17.0)
Hemoglobin: 12.9 g/dL — ABNORMAL LOW (ref 13.0–17.0)
O2 Saturation: 92 %
O2 Saturation: 30 %
Patient temperature: 98
Patient temperature: 96.6
Potassium: 5.4 mmol/L — ABNORMAL HIGH (ref 3.5–5.1)
Potassium: 5.1 mmol/L (ref 3.5–5.1)
Sodium: 132 mmol/L — ABNORMAL LOW (ref 135–145)
Sodium: 131 mmol/L — ABNORMAL LOW (ref 135–145)
TCO2: 34 mmol/L — ABNORMAL HIGH (ref 22–32)
TCO2: 30 mmol/L (ref 22–32)
pCO2 arterial: 107.3 mm[Hg] (ref 32–48)
pCO2 arterial: 85.7 mm[Hg] (ref 32–48)
pH, Arterial: 7.06 — CL (ref 7.35–7.45)
pH, Arterial: 7.109 — CL (ref 7.35–7.45)
pO2, Arterial: 94 mm[Hg] (ref 83–108)
pO2, Arterial: 25 mm[Hg] — CL (ref 83–108)

## 2023-07-21 LAB — COMPREHENSIVE METABOLIC PANEL
ALT: 11 U/L (ref 0–44)
AST: 15 U/L (ref 15–41)
Albumin: 3.7 g/dL (ref 3.5–5.0)
Alkaline Phosphatase: 63 U/L (ref 38–126)
Anion gap: 15 (ref 5–15)
BUN: 44 mg/dL — ABNORMAL HIGH (ref 8–23)
CO2: 22 mmol/L (ref 22–32)
Calcium: 8.3 mg/dL — ABNORMAL LOW (ref 8.9–10.3)
Chloride: 98 mmol/L (ref 98–111)
Creatinine, Ser: 6.73 mg/dL — ABNORMAL HIGH (ref 0.61–1.24)
GFR, Estimated: 8 mL/min — ABNORMAL LOW (ref >60)
Glucose, Bld: 142 mg/dL — ABNORMAL HIGH (ref 70–99)
Potassium: 5.6 mmol/L — ABNORMAL HIGH (ref 3.5–5.1)
Sodium: 135 mmol/L (ref 135–145)
Total Bilirubin: 0.2 mg/dL — ABNORMAL LOW (ref 0.3–1.2)
Total Protein: 6.8 g/dL (ref 6.5–8.1)

## 2023-07-21 LAB — CBC
HCT: 36.7 % — ABNORMAL LOW (ref 39.0–52.0)
Hemoglobin: 10.6 g/dL — ABNORMAL LOW (ref 13.0–17.0)
MCH: 29.3 pg (ref 26.0–34.0)
MCHC: 28.9 g/dL — ABNORMAL LOW (ref 30.0–36.0)
MCV: 101.4 fL — ABNORMAL HIGH (ref 80.0–100.0)
Platelets: 153 10*3/uL (ref 150–400)
RBC: 3.62 MIL/uL — ABNORMAL LOW (ref 4.22–5.81)
RDW: 14.6 % (ref 11.5–15.5)
WBC: 5.2 10*3/uL (ref 4.0–10.5)
nRBC: 0 % (ref 0.0–0.2)

## 2023-07-21 LAB — TROPONIN I (HIGH SENSITIVITY)
Troponin I (High Sensitivity): 8 ng/L (ref <18)
Troponin I (High Sensitivity): 8 ng/L (ref <18)

## 2023-07-21 LAB — PROCALCITONIN: Procalcitonin: 0.31 ng/mL

## 2023-07-21 LAB — CORTISOL: Cortisol, Plasma: 13.5 ug/dL

## 2023-07-21 LAB — I-STAT CG4 LACTIC ACID, ED
Lactic Acid, Venous: 1 mmol/L (ref 0.5–1.9)
Lactic Acid, Venous: 1 mmol/L (ref 0.5–1.9)

## 2023-07-21 LAB — APTT: aPTT: 32 s (ref 24–36)

## 2023-07-21 LAB — RESP PANEL BY RT-PCR (RSV, FLU A&B, COVID)  RVPGX2
Influenza A by PCR: NEGATIVE
Influenza B by PCR: NEGATIVE
Resp Syncytial Virus by PCR: NEGATIVE
SARS Coronavirus 2 by RT PCR: NEGATIVE

## 2023-07-21 LAB — PROTIME-INR
INR: 1 (ref 0.8–1.2)
Prothrombin Time: 13.5 s (ref 11.4–15.2)

## 2023-07-21 LAB — MRSA NEXT GEN BY PCR, NASAL: MRSA by PCR Next Gen: NOT DETECTED

## 2023-07-21 LAB — GLUCOSE, CAPILLARY
Glucose-Capillary: 86 mg/dL (ref 70–99)
Glucose-Capillary: 92 mg/dL (ref 70–99)

## 2023-07-21 LAB — MAGNESIUM: Magnesium: 2 mg/dL (ref 1.7–2.4)

## 2023-07-21 LAB — HEPATITIS B SURFACE ANTIGEN: Hepatitis B Surface Ag: NONREACTIVE

## 2023-07-21 LAB — LIPASE, BLOOD: Lipase: 43 U/L (ref 11–51)

## 2023-07-21 LAB — PHOSPHORUS: Phosphorus: 5.6 mg/dL — ABNORMAL HIGH (ref 2.5–4.6)

## 2023-07-21 LAB — AMMONIA: Ammonia: 57 umol/L — ABNORMAL HIGH (ref 9–35)

## 2023-07-21 LAB — AMYLASE: Amylase: 43 U/L (ref 28–100)

## 2023-07-21 MED ORDER — IPRATROPIUM-ALBUTEROL 0.5-2.5 (3) MG/3ML IN SOLN
3.0000 mL | Freq: Once | RESPIRATORY_TRACT | Status: AC
  Administered 2023-07-21: 3 mL via RESPIRATORY_TRACT
  Filled 2023-07-21: qty 3

## 2023-07-21 MED ORDER — ALBUMIN HUMAN 25 % IV SOLN: 25.0000 g | INTRAVENOUS | Status: DC | PRN

## 2023-07-21 MED ORDER — SODIUM CHLORIDE 0.9 % IV SOLN
250.0000 mL | INTRAVENOUS | Status: DC
  Administered 2023-07-21: 250 mL via INTRAVENOUS

## 2023-07-21 MED ORDER — SODIUM CHLORIDE 0.9 % IV BOLUS
1000.0000 mL | Freq: Once | INTRAVENOUS | Status: AC
  Administered 2023-07-21: 1000 mL via INTRAVENOUS

## 2023-07-21 MED ORDER — ONDANSETRON HCL 4 MG/2ML IJ SOLN
4.0000 mg | Freq: Four times a day (QID) | INTRAMUSCULAR | Status: DC | PRN
  Filled 2023-07-21: qty 2

## 2023-07-21 MED ORDER — LORAZEPAM 2 MG/ML IJ SOLN
1.0000 mg | Freq: Once | INTRAMUSCULAR | Status: DC
  Filled 2023-07-21: qty 1

## 2023-07-21 MED ORDER — REVEFENACIN 175 MCG/3ML IN SOLN
175.0000 ug | Freq: Every day | RESPIRATORY_TRACT | Status: DC
  Administered 2023-07-21 – 2023-07-27 (×7): 175 ug via RESPIRATORY_TRACT
  Filled 2023-07-21 (×7): qty 3

## 2023-07-21 MED ORDER — CHLORHEXIDINE GLUCONATE CLOTH 2 % EX PADS
6.0000 | MEDICATED_PAD | Freq: Every day | CUTANEOUS | Status: DC
  Administered 2023-07-21 – 2023-07-23 (×3): 6 via TOPICAL

## 2023-07-21 MED ORDER — MIDODRINE HCL 5 MG PO TABS
10.0000 mg | ORAL_TABLET | ORAL | Status: DC | PRN
  Administered 2023-07-21 – 2023-07-27 (×3): 10 mg via ORAL
  Filled 2023-07-21 (×4): qty 2

## 2023-07-21 MED ORDER — SODIUM CHLORIDE 0.9 % IV SOLN
2.0000 g | Freq: Once | INTRAVENOUS | Status: AC
  Administered 2023-07-21: 2 g via INTRAVENOUS
  Filled 2023-07-21: qty 20

## 2023-07-21 MED ORDER — SODIUM CHLORIDE 0.9% FLUSH
3.0000 mL | Freq: Two times a day (BID) | INTRAVENOUS | Status: DC
  Administered 2023-07-21 – 2023-07-27 (×11): 3 mL via INTRAVENOUS

## 2023-07-21 MED ORDER — DOCUSATE SODIUM 100 MG PO CAPS
100.0000 mg | ORAL_CAPSULE | Freq: Two times a day (BID) | ORAL | Status: DC | PRN
  Administered 2023-07-23: 100 mg via ORAL
  Filled 2023-07-21: qty 1

## 2023-07-21 MED ORDER — POLYETHYLENE GLYCOL 3350 17 G PO PACK
17.0000 g | PACK | Freq: Every day | ORAL | Status: DC | PRN
  Administered 2023-07-23: 17 g via ORAL
  Filled 2023-07-21: qty 1

## 2023-07-21 MED ORDER — LIDOCAINE-PRILOCAINE 2.5-2.5 % EX CREA: 1.0000 | TOPICAL_CREAM | CUTANEOUS | Status: DC | PRN

## 2023-07-21 MED ORDER — SODIUM CHLORIDE 0.9% FLUSH: 3.0000 mL | INTRAVENOUS | Status: DC | PRN

## 2023-07-21 MED ORDER — NOREPINEPHRINE 4 MG/250ML-% IV SOLN
INTRAVENOUS | Status: AC
  Administered 2023-07-21: 2 ug/min via INTRAVENOUS
  Filled 2023-07-21: qty 250

## 2023-07-21 MED ORDER — LIDOCAINE HCL (PF) 1 % IJ SOLN: 5.0000 mL | INTRAMUSCULAR | Status: DC | PRN

## 2023-07-21 MED ORDER — VANCOMYCIN HCL 1250 MG/250ML IV SOLN
1250.0000 mg | Freq: Once | INTRAVENOUS | Status: AC
  Administered 2023-07-21: 1250 mg via INTRAVENOUS
  Filled 2023-07-21: qty 250

## 2023-07-21 MED ORDER — MYCOPHENOLATE SODIUM 180 MG PO TBEC
180.0000 mg | DELAYED_RELEASE_TABLET | Freq: Two times a day (BID) | ORAL | Status: DC
  Administered 2023-07-21 – 2023-07-27 (×13): 180 mg via ORAL
  Filled 2023-07-21 (×16): qty 1

## 2023-07-21 MED ORDER — PANTOPRAZOLE SODIUM 40 MG PO TBEC
40.0000 mg | DELAYED_RELEASE_TABLET | Freq: Every day | ORAL | Status: DC
  Administered 2023-07-22 – 2023-07-27 (×6): 40 mg via ORAL
  Filled 2023-07-21 (×6): qty 1

## 2023-07-21 MED ORDER — SODIUM CHLORIDE 0.9 % IV SOLN
500.0000 mg | INTRAVENOUS | Status: AC
  Administered 2023-07-22 – 2023-07-27 (×6): 500 mg via INTRAVENOUS
  Filled 2023-07-21 (×6): qty 5

## 2023-07-21 MED ORDER — ALTEPLASE 2 MG IJ SOLR: 2.0000 mg | Freq: Once | INTRAMUSCULAR | Status: DC | PRN

## 2023-07-21 MED ORDER — SODIUM CHLORIDE 0.9 % IV SOLN: 250.0000 mL | INTRAVENOUS | Status: DC | PRN

## 2023-07-21 MED ORDER — SODIUM CHLORIDE 0.9 % IV SOLN
500.0000 mg | Freq: Once | INTRAVENOUS | Status: AC
  Administered 2023-07-21: 500 mg via INTRAVENOUS
  Filled 2023-07-21: qty 5

## 2023-07-21 MED ORDER — HEPARIN SODIUM (PORCINE) 1000 UNIT/ML DIALYSIS
1000.0000 [IU] | INTRAMUSCULAR | Status: DC | PRN
  Administered 2023-07-21 (×2): 1000 [IU]
  Filled 2023-07-21: qty 1

## 2023-07-21 MED ORDER — SODIUM CHLORIDE 0.9 % IV SOLN
2.0000 g | Freq: Once | INTRAVENOUS | Status: AC
  Administered 2023-07-21: 2 g via INTRAVENOUS
  Filled 2023-07-21: qty 12.5

## 2023-07-21 MED ORDER — ORAL CARE MOUTH RINSE: 15.0000 mL | OROMUCOSAL | Status: DC | PRN

## 2023-07-21 MED ORDER — SODIUM CHLORIDE 0.9 % IV BOLUS: 500.0000 mL | Freq: Once | INTRAVENOUS | Status: DC

## 2023-07-21 MED ORDER — VANCOMYCIN HCL 500 MG/100ML IV SOLN
500.0000 mg | Freq: Once | INTRAVENOUS | Status: AC
  Administered 2023-07-21: 500 mg via INTRAVENOUS
  Filled 2023-07-21: qty 100

## 2023-07-21 MED ORDER — ANTICOAGULANT SODIUM CITRATE 4% (200MG/5ML) IV SOLN: 5.0000 mL | Status: DC | PRN

## 2023-07-21 MED ORDER — ARFORMOTEROL TARTRATE 15 MCG/2ML IN NEBU
15.0000 ug | INHALATION_SOLUTION | Freq: Two times a day (BID) | RESPIRATORY_TRACT | Status: DC
  Administered 2023-07-21 – 2023-07-27 (×12): 15 ug via RESPIRATORY_TRACT
  Filled 2023-07-21 (×13): qty 2

## 2023-07-21 MED ORDER — PENTAFLUOROPROP-TETRAFLUOROETH EX AERO: 1.0000 | INHALATION_SPRAY | CUTANEOUS | Status: DC | PRN

## 2023-07-21 MED ORDER — NOREPINEPHRINE 4 MG/250ML-% IV SOLN: 2.0000 ug/min | INTRAVENOUS | Status: DC

## 2023-07-21 MED ORDER — LACTATED RINGERS IV SOLN: INTRAVENOUS | Status: DC

## 2023-07-21 MED ORDER — TACROLIMUS 1 MG PO CAPS
6.0000 mg | ORAL_CAPSULE | Freq: Two times a day (BID) | ORAL | Status: DC
  Administered 2023-07-21 – 2023-07-27 (×13): 6 mg via ORAL
  Filled 2023-07-21 (×15): qty 6

## 2023-07-21 MED ORDER — SODIUM CHLORIDE 0.9 % IV SOLN
2.0000 g | INTRAVENOUS | Status: AC
  Administered 2023-07-22 – 2023-07-27 (×6): 2 g via INTRAVENOUS
  Filled 2023-07-21 (×7): qty 20

## 2023-07-21 MED ORDER — VANCOMYCIN HCL 750 MG/150ML IV SOLN
750.0000 mg | INTRAVENOUS | Status: DC
  Administered 2023-07-24: 750 mg via INTRAVENOUS
  Filled 2023-07-21 (×3): qty 150

## 2023-07-21 NOTE — Procedures (Signed)
Received patient in bed to unit.  Alert and oriented.  Informed consent signed and in chart.   TX duration:3 hrs  Patient was hypotensive and tachycardic during HD.  Stable post HD.  Hand-off given to patient's nurse.   Access used: Left upper arm AVG. Access issues: High venous pressure.   Total UF removed: 2.9L Medication(s) given: Heparin 1000 units x 2.    [facility]

## 2023-07-21 NOTE — H&P (Signed)
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  Patient had multiple thoracentesis over the last 2 years and presents with 10 days of somnolence noted to be hypercarbic in the ER placed on BiPAP with improvement.  He gets dialysis Monday Wednesdays and Fridays he is due for treatment today.  He usually receives his care at [facility] but we have been notified that they are not accepting patients due to lack of beds.  He will be admitted to COVID intensive care unit we will stop his Plavix and attempt thoracentesis at a later date.  Pertinent  Medical History   Past Medical History:  Diagnosis Date   Anemia    Heart failure (HCC)    Hypertension    Osteoporosis    Renal disorder      Significant Hospital Events: Including procedures, antibiotic start and stop dates in addition to other pertinent events     Interim History / Subjective:  10 days of Sominex  Objective   Blood pressure (!) 99/58, pulse (!) 102, temperature (!) 96.6 F (35.9 C), temperature source Rectal, resp. rate 17, SpO2 100%.    FiO2 (%):  [50 %] 50 % PEEP:  [6 cmH20] 6 cmH20   Intake/Output Summary (Last 24 hours) at 07/21/2023 0824 Last data filed at 07/21/2023 0600 Gross per 24 hour  Intake 96.4 ml  Output --  Net 96.4 ml   There were no vitals filed for this visit.  Examination: General: Appears older than stated age currently on BiPAP HENT: No JVD is appreciated tunneled HD catheter noted on the right bicep and to subclavian Lungs: Decreased throughout currently on 50% noninvasive Cardiovascular: Heart sounds are regular Abdomen: Abdomen soft nontender Extremities: Extremities warm without edema Neuro:  Neurologically follows commands moves all extremities GU: No urine at this time  Resolved Hospital Problem list     Assessment & Plan:  Acute hypercarbic respiratory failure in the setting of heart transplant, pleural effusion have required thoracentesis approximately 5 times in the past.  He usually receives his care at [facility] but presents with 10 days of increasing somnolence elevated pCO2 and decreased PH.  Currently stabilized on noninvasive mechanical ventilatory support due to for dialysis today. Will attempt to contact [facility] for ER to ER transfer since she is a heart transplant and is team is located at [facility]. Noninvasive mechanical ventilatory support as needed Attempt hemodialysis prior to transfer Empirical antimicrobial therapy If unable to transfer to [facility] admit to intensive care at [facility]  History of heart transplant currently on Prograf We will stop Plavix and Eliquis at this time.  Will plan thoracentesis in the near future  Hypotension Hold antihypertensives Correct PH  End-stage renal disease with dialysis Monday Wednesdays and Fridays Will arrange for dialysis as soon as possible/carotid Thursday for dialysis We have spoken with the nephrology team and they are aware in [phone]  Best Practice (right click and "Reselect all SmartList Selections" daily)   Diet/type: NPO DVT prophylaxis: DOAC GI prophylaxis: PPI Lines: Dialysis Catheter Foley:  Yes, and it is still needed Code Status:  full code Last date of multidisciplinary goals of care discussid Wife up Labs   CBC: Recent Labs  Lab 07/21/23 0431  07/21/23 0445 07/21/23 0503 07/21/23 0631 07/21/23 0705  WBC  --  7.2  --   --   --   NEUTROABS  --  5.3  --   --   --   HGB 14.3 12.7* 15.6 13.6 12.9*  HCT 42.0 45.3 46.0 40.0 38.0*  MCV  --  100.2*  --   --   --   PLT  --  194  --   --   --     Basic Metabolic Panel: Recent Labs  Lab 07/21/23 0431  07/21/23 0445 07/21/23 0503 07/21/23 0631 07/21/23 0705  NA 132* 135 133* 137 131*  K 5.4* 5.6* 5.6* 4.9 5.1  CL  --  98  --   --   --   CO2  --  22  --   --   --   GLUCOSE  --  142*  --   --   --   BUN  --  44*  --   --   --   CREATININE  --  6.73*  --   --   --   CALCIUM  --  8.3*  --   --   --    GFR: Estimated Creatinine Clearance: 9.3 mL/min (A) (by C-G formula based on SCr of 6.73 mg/dL (H)). Recent Labs  Lab 07/21/23 0445 07/21/23 0504 07/21/23 0631  WBC 7.2  --   --   LATICACIDVEN  --  1.0 1.0    Liver Function Tests: Recent Labs  Lab 07/21/23 0445  AST 15  ALT 11  ALKPHOS 63  BILITOT 0.2*  PROT 6.8  ALBUMIN 3.7   No results for input(s): "LIPASE", "AMYLASE" in the last 168 hours. Recent Labs  Lab 07/21/23 0622  AMMONIA 57*    ABG    Component Value Date/Time   PHART 7.109 (LL) 07/21/2023 0705   PCO2ART 85.7 (HH) 07/21/2023 0705   PO2ART 25 (LL) 07/21/2023 0705   HCO3 27.6 07/21/2023 0705   TCO2 30 07/21/2023 0705   ACIDBASEDEF 4.0 (H) 07/21/2023 0705   O2SAT 30 07/21/2023 0705     Coagulation Profile: Recent Labs  Lab 07/21/23 0445  INR 1.0    Cardiac Enzymes: No results for input(s): "CKTOTAL", "CKMB", "CKMBINDEX", "TROPONINI" in the last 168 hours.  HbA1C: Hgb A1c MFr Bld  Date/Time Value Ref Range Status  01/06/2022 08:09 AM 4.9 4.8 - 5.6 % Final    Comment:    (NOTE)         Prediabetes: 5.7 - 6.4         Diabetes: >6.4         Glycemic control for adults with diabetes: <7.0     CBG: No results for input(s): "GLUCAP" in the last 168 hours.  Review of Systems:   10 point review of system taken, please see HPI for positives and negatives.   Past Medical History:  He,  has a past medical history of Anemia, Heart failure (HCC), Hypertension, Osteoporosis, and Renal disorder.   Surgical History:   Past Surgical History:  Procedure Laterality Date   AV FISTULA PLACEMENT Right    HEART TRANSPLANT     THORACENTESIS  Right 06/22/2021   Procedure: THORACENTESIS;  Surgeon: Leslye Peer, MD;  Location: [facility] ENDOSCOPY;  Service: Cardiopulmonary;  Laterality: Right;     Social History:   reports that he has never smoked. He has never used smokeless tobacco. He reports that he does not drink alcohol and does not  use drugs.   Family History:  His family history is not on file.   Allergies Allergies  Allergen Reactions   Scopolamine Other (See Comments)     Hallucinations Induces severe delirium    Lorazepam Other (See Comments)    Hallucinations Severe somnolence/delirium      Home Medications  Prior to Admission medications   Medication Sig Start Date End Date Taking? Authorizing Provider  acetaminophen (TYLENOL) 500 MG tablet Take 500 mg by mouth every 6 (six) hours as needed for headache (pain).    [provider]  amLODipine (NORVASC) 10 MG tablet Take by mouth. 09/08/22   [provider]  atorvastatin (LIPITOR) 20 MG tablet Take 20 mg by mouth every morning. 09/10/20   [provider]  calcium carbonate (OSCAL) 1500 (600 Ca) MG TABS tablet Take 1,500 mg by mouth every morning. 08/18/20   [provider]  clopidogrel (PLAVIX) 75 MG tablet Take 75 mg by mouth every morning. 05/20/21   [provider]  diltiazem (CARDIZEM CD) 180 MG 24 hr capsule Take 180 mg by mouth every evening. Patient not taking: Reported on 04/24/2023 05/27/21   [provider]  mirtazapine (REMERON) 45 MG tablet Take 45 mg by mouth every evening. 05/18/21   [provider]  mycophenolate (MYFORTIC) 180 MG EC tablet Take 180 mg by mouth 2 (two) times daily. 06/07/21   [provider]  pantoprazole (PROTONIX) 40 MG tablet Take 40 mg by mouth every morning. 09/02/20   [provider]  tacrolimus (PROGRAF) 1 MG capsule Take 1 mg by mouth 2 (two) times daily. Take with a 5 mg capsule for a total dose of 6 mg twice daily 09/04/20   [provider]   tacrolimus (PROGRAF) 5 MG capsule Take 5 mg by mouth 2 (two) times daily. Take with a 1 mg capsule for a total dose of 6 mg twice daily 05/10/21   [provider]  torsemide (DEMADEX) 100 MG tablet Take 100 mg by mouth See admin instructions. Take one tablet (100 mg) by mouth on Sunday, Tuesday, Thursday, Saturday mornings (non-dialysis days) Patient not taking: Reported on 04/24/2023 05/28/21   [provider]     Critical care time: 35 min     Brett Canales Taseen Marasigan ACNP Acute Care Nurse Practitioner [facility] Pulmonary/Critical Care Please consult Amion 07/21/2023, 8:25 AM

## 2023-07-21 NOTE — Progress Notes (Signed)
ABG results given to Dr. Francine Graven. No new orders received. RT will continue to monitor and be available as needed.

## 2023-07-21 NOTE — ED Triage Notes (Signed)
Patient arrived with EMS from home reports shortness of breath this evening with lethargy and generalized weakness , hemodialysis q Mon/Wed/Fri , history of heart transplant . Diaphoretic at arrival . CBG= 143. O2 sat= 88% 2 lpm/[place].

## 2023-07-21 NOTE — Progress Notes (Signed)
Spoke with Elita Quick, RN regarding PICC.  Made aware that PICC will not be placed tonight.  Patient is also being followed by nephrology and will need to give approval prior to PICC being placed.  Jose, RN aware.

## 2023-07-21 NOTE — ED Provider Notes (Signed)
[facility]ENCY DEPT [facility] Emergency Department Provider Note MRN:  [id number]  Arrival date & time: 07/21/23     Chief Complaint   Shortness of Breath   History of Present Illness   Rickey Erickson is a 67 y.o. year-old male with a history of heart transplant, ESRD presenting to the ED with chief complaint of shortness of breath.  EMS called for shortness of breath and somnolence this evening.  Patient is very sweaty and sleepy with EMS.  Review of Systems  A thorough review of systems was obtained and all systems are negative except as noted in the HPI and PMH.   Patient's Health History    Past Medical History:  Diagnosis Date   Anemia    Heart failure (HCC)    Hypertension    Osteoporosis    Renal disorder     Past Surgical History:  Procedure Laterality Date   AV FISTULA PLACEMENT Right    HEART TRANSPLANT     THORACENTESIS Right 06/22/2021   Procedure: THORACENTESIS;  Surgeon: Leslye Peer, MD;  Location: [facility] ENDOSCOPY;  Service: Cardiopulmonary;  Laterality: Right;    No family history on file.  Social History   Socioeconomic History   Marital status: Single    Spouse name: Not on file   Number of children: Not on file   Years of education: Not on file   Highest education level: Not on file  Occupational History   Occupation: Retired  Tobacco Use   Smoking status: Never   Smokeless tobacco: Never  Substance and Sexual Activity   Alcohol use: Never   Drug use: Never   Sexual activity: Not on file  Other Topics Concern   Not on file  Social History Narrative   Not on file   Social Determinants of Health   Financial Resource Strain: Low Risk  (03/09/2023)   Received from [facility], [facility]   Overall Financial Resource Strain (CARDIA)    Difficulty of Paying Living Expenses: Not very hard  Food Insecurity: No Food Insecurity (03/09/2023)   Received from [facility], [facility]   Hunger Vital Sign    Worried About  Running Out of Food in the Last Year: Never true    Ran Out of Food in the Last Year: Never true  Transportation Needs: No Transportation Needs (05/09/2022)   Received from [facility], [facility]   [facility] - Transportation    Lack of Transportation (Medical): No    Lack of Transportation (Non-Medical): No  Physical Activity: Not on file  Stress: No Stress Concern Present (05/08/2018)   Received from [facility], [facility]   [facility] - Occupational Stress Questionnaire    Feeling of Stress : Not at all  Social Connections: Unknown (05/08/2018)   Received from [facility], [facility]   Social Connection and Isolation Panel [NHANES]    Frequency of Communication with Friends and Family: Patient declined    Frequency of Social Gatherings with Friends and Family: Patient declined    Attends Religious Services: Patient declined    Active Member of Clubs or Organizations: Patient declined    Attends Banker Meetings: Patient declined    Marital Status: Patient declined  Intimate Partner Violence: Not on file     Physical Exam   Vitals:   07/21/23 0645 07/21/23 0700  BP: 127/67 (!) 123/56  Pulse: 100 (!) 101  Resp: (!) 5 (!)  26  Temp:    SpO2: 95% 93%    CONSTITUTIONAL: Ill-appearing, diaphoretic NEURO/PSYCH: Appears somnolent with eyes closed but will wake to voice and answer questions, follow commands EYES:  eyes equal and reactive ENT/NECK:  no LAD, no JVD CARDIO: Regular rate, well-perfused, normal S1 and S2 PULM:  CTAB no wheezing or rhonchi GI/GU:  non-distended, non-tender MSK/SPINE:  No gross deformities, no edema SKIN:  no rash, atraumatic   *Additional and/or pertinent findings included in MDM below  Diagnostic and Interventional Summary    EKG Interpretation Date/Time:  Friday July 21 2023 04:22:46 EDT Ventricular Rate:  99 PR Interval:  145 QRS Duration:  111 QT Interval:  351 QTC  Calculation: 451 R Axis:   118  Text Interpretation: Sinus rhythm Right ventricular hypertrophy Minimal ST elevation, inferior leads Confirmed by Kennis Carina [phone]) on 07/21/2023 7:13:25 AM       Labs Reviewed  COMPREHENSIVE METABOLIC PANEL - Abnormal; Notable for the following components:      Result Value   Potassium 5.6 (*)    Glucose, Bld 142 (*)    BUN 44 (*)    Creatinine, Ser 6.73 (*)    Calcium 8.3 (*)    Total Bilirubin 0.2 (*)    GFR, Estimated 8 (*)    All other components within normal limits  CBC WITH DIFFERENTIAL/PLATELET - Abnormal; Notable for the following components:   Hemoglobin 12.7 (*)    MCV 100.2 (*)    MCHC 28.0 (*)    Lymphs Abs 0.4 (*)    Abs Immature Granulocytes 0.58 (*)    All other components within normal limits  AMMONIA - Abnormal; Notable for the following components:   Ammonia 57 (*)    All other components within normal limits  I-STAT ARTERIAL BLOOD GAS, ED - Abnormal; Notable for the following components:   pH, Arterial 7.060 (*)    pCO2 arterial 107.3 (*)    Bicarbonate 30.5 (*)    TCO2 34 (*)    Acid-base deficit 3.0 (*)    Sodium 132 (*)    Potassium 5.4 (*)    All other components within normal limits  I-STAT VENOUS BLOOD GAS, ED - Abnormal; Notable for the following components:   pH, Ven 7.154 (*)    pCO2, Ven 78.1 (*)    pO2, Ven 68 (*)    Acid-base deficit 4.0 (*)    Sodium 133 (*)    Potassium 5.6 (*)    Calcium, Ion 1.10 (*)    All other components within normal limits  I-STAT VENOUS BLOOD GAS, ED - Abnormal; Notable for the following components:   pH, Ven 7.104 (*)    pCO2, Ven 79.8 (*)    pO2, Ven 56 (*)    Acid-base deficit 6.0 (*)    Calcium, Ion 1.07 (*)    All other components within normal limits  I-STAT ARTERIAL BLOOD GAS, ED - Abnormal; Notable for the following components:   pH, Arterial 7.109 (*)    pCO2 arterial 85.7 (*)    pO2, Arterial 25 (*)    Acid-base deficit 4.0 (*)    Sodium 131 (*)    HCT  38.0 (*)    Hemoglobin 12.9 (*)    All other components within normal limits  RESP PANEL BY RT-PCR (RSV, FLU A&B, COVID)  RVPGX2  CULTURE, BLOOD (ROUTINE X 2)  CULTURE, BLOOD (ROUTINE X 2)  PROTIME-INR  APTT  URINALYSIS, W/ REFLEX TO CULTURE (INFECTION SUSPECTED)  BLOOD GAS,  ARTERIAL  I-STAT CG4 LACTIC ACID, ED  I-STAT CG4 LACTIC ACID, ED  I-STAT VENOUS BLOOD GAS, ED  TROPONIN I (HIGH SENSITIVITY)  TROPONIN I (HIGH SENSITIVITY)    DG Chest Port 1 View  Final Result      Medications  LORazepam (ATIVAN) injection 1 mg (0 mg Intravenous Hold 07/21/23 0550)  azithromycin (ZITHROMAX) 500 mg in sodium chloride 0.9 % 250 mL IVPB (500 mg Intravenous New Bag/Given 07/21/23 0736)  vancomycin (VANCOREADY) IVPB 1250 mg/250 mL (has no administration in time range)  revefenacin (YUPELRI) nebulizer solution 175 mcg (has no administration in time range)  arformoterol (BROVANA) nebulizer solution 15 mcg (has no administration in time range)  ipratropium-albuterol (DUONEB) 0.5-2.5 (3) MG/3ML nebulizer solution 3 mL (has no administration in time range)  cefTRIAXone (ROCEPHIN) 2 g in sodium chloride 0.9 % 100 mL IVPB (0 g Intravenous Stopped 07/21/23 0600)  sodium chloride 0.9 % bolus 1,000 mL (0 mLs Intravenous Stopped 07/21/23 0721)  ceFEPIme (MAXIPIME) 2 g in sodium chloride 0.9 % 100 mL IVPB (0 g Intravenous Stopped 07/21/23 0736)  sodium chloride 0.9 % bolus 1,000 mL (1,000 mLs Intravenous New Bag/Given 07/21/23 0726)     Procedures  /  Critical Care .Critical Care  Performed by: Sabas Sous, MD Authorized by: Sabas Sous, MD   Critical care provider statement:    Critical care time (minutes):  80   Critical care was necessary to treat or prevent imminent or life-threatening deterioration of the following conditions:  Respiratory failure   Critical care was time spent personally by me on the following activities:  Development of treatment plan with patient or surrogate, discussions with  consultants, evaluation of patient's response to treatment, examination of patient, ordering and review of laboratory studies, ordering and review of radiographic studies, ordering and performing treatments and interventions, pulse oximetry, re-evaluation of patient's condition and review of old charts   ED Course and Medical Decision Making  Initial Impression and Ddx Initial concern for possible sepsis, hypercarbia, electrolyte disarray, hypoglycemia.  pCO2 found to be greater than 100, starting BiPAP.  Past medical/surgical history that increases complexity of ED encounter: Heart transplant, ESRD  Interpretation of Diagnostics I personally reviewed the EKG and my interpretation is as follows: Sinus rhythm without significant change from prior  Labs reveal respiratory acidosis  Patient Reassessment and Ultimate Disposition/Management     Chest x-ray with suspicion for pneumonia.  Called [facility], no beds, no ability to transfer.  Given patient's continued hypercarbia despite BiPAP ICU consulted for admission, coming to evaluate.  Clinically patient seems a lot better, more alert, intubation not needed at this time but will need close monitoring.  Patient management required discussion with the following services or consulting groups:  Intensivist Service  Complexity of Problems Addressed Acute illness or injury that poses threat of life of bodily function  Additional Data Reviewed and Analyzed Further history obtained from: Further history from spouse/family member  Additional Factors Impacting ED Encounter Risk Consideration of hospitalization  Elmer Sow. Pilar Plate, MD [facility] Emergency Medicine [facility] [email or web address]  Final Clinical Impressions(s) / ED Diagnoses     ICD-10-CM   1. Acute respiratory failure with hypercapnia (HCC)  J96.02       ED Discharge Orders     None        Discharge Instructions Discussed with and Provided to Patient:    Discharge Instructions   None      Sabas Sous, MD 07/21/23 [phone]

## 2023-07-21 NOTE — Progress Notes (Signed)
ED Pharmacy Antibiotic Sign Off An antibiotic consult was received from an ED provider for vancomycin and cefepime per pharmacy dosing for pneumonia in immunocompromised pt. A chart review was completed to assess appropriateness.   The following one time order(s) were placed:  Vancomycin 1250mg  IV x1 Cefepime 2g IV x1  Further antibiotic and/or antibiotic pharmacy consults should be ordered by the admitting provider if indicated.   Thank you for allowing pharmacy to be a part of this patient's care.   Vernard Gambles, PharmD, BCPS   Clinical Pharmacist 07/21/23 6:35 AM

## 2023-07-21 NOTE — Progress Notes (Signed)
Pharmacy Antibiotic Note  Rickey Erickson is a 67 y.o. male admitted on 07/21/2023 with pneumonia in the setting of heart transplant on immunosuppressant therapy and ESRD on HD.  Pharmacy has been consulted for Vancomycin dosing.  WBC 5.2, afebrile Patient received Ceftriaxone 2g IV x1, Azithromycin 500mg  IV x1, Cefepime 2g IV x1, and Vancomycin 1250mg  IV x1 in ED.  Plan: Continue Ceftriaxone 2g IV q24h and Azithromycin 500mg  IV q24h per MD Give vancomycin 500mg  IV x1 post-HD on 9/27, then Schedule vancomycin 750mg  IV qMWF HD starting 9/30 F/u HD schedule and adjust vancomycin regimen as needed Monitor daily CBC, temp, and for clinical signs of improvement  F/u cultures and de-escalate antibiotics as able   Weight: 62.2 kg (137 lb 2 oz)  Temp (24hrs), Avg:97.4 F (36.3 C), Min:96.6 F (35.9 C), Max:97.8 F (36.6 C)  Recent Labs  Lab 07/21/23 0445 07/21/23 0504 07/21/23 0631 07/21/23 1246  WBC 7.2  --   --  5.2  CREATININE 6.73*  --   --  6.83*  LATICACIDVEN  --  1.0 1.0  --     Estimated Creatinine Clearance: 9.5 mL/min (A) (by C-G formula based on SCr of 6.83 mg/dL (H)).    Allergies  Allergen Reactions   Transderm-Scop [Scopolamine] Other (See Comments)    Hallucinations Induces severe delirium    Nsaids Other (See Comments)    Kidney impact   Ativan [Lorazepam] Other (See Comments)    Hallucinations Severe somnolence/delirium     Antimicrobials this admission: Azithro 9/27 >>  CTX 9/27 >>  Cefepime 9/27 x1 Vancomycin 9/27 >>   Dose adjustments this admission: N/A  Microbiology results: 9/27 Bld Cx x2: ordered 9/27 MRSA PCR: not dectected  Thank you for allowing pharmacy to be a part of this patient's care.  Wilburn Cornelia, PharmD, BCPS Clinical Pharmacist 07/21/2023 3:23 PM   Please refer to AMION for pharmacy phone number

## 2023-07-21 NOTE — Sepsis Progress Note (Signed)
Elink monitoring for the code sepsis protocol.  

## 2023-07-21 NOTE — ED Notes (Signed)
Pt was still able to follow commands and respond back to questions with a nod or saying yes through his bipap mask. Pt is still Aox4

## 2023-07-21 NOTE — Consult Note (Signed)
[facility] Nephrology Consult Note: Reason for Consult: To manage dialysis and dialysis related needs Referring Physician: Dr, Francine Graven, Christiane Ha  HPI:  Rickey Erickson is an 67 y.o. male with past medical history significant for hypertension, heart transplant at [facility], ESRD on HD who was presented with shortness of breath and lethargy seen as a consultation for the management of ESRD. Apparently patient was not feeling well for last few days.  Yesterday his breathing got worse associated with generalized weakness and lethargy.  He required higher amount of oxygen at home. In the ER he was afebrile, blood pressure was soft but this is his baseline.  He was initially placed on 3 L of oxygen and later to BiPAP.  The labs showed pH of 7.10, pCO2 85.7, hemoglobin 12.9, sodium 131, potassium level 5.1 which was improved from 5.6.  The chest x-ray shows increased density in the right lung consistent with fluid or lung consolidation.  Cardiomegaly. He received empiric antibiotics treatment in ER including azithromycin, cefepime, vancomycin.  Apparently also received 2 L of IV fluid bolus.  He is now admitted to ICU for further management. When I examined the patient he was on BiPAP.  Able to communicate.  His sister was present with him.  Patient is well-known to me from outpatient. OP HD orders:  Dialyzes at  [facility], MWF, 4 hours,  EDW 60 kg 2K 3ca,RUE HeRo Graft, Mircera 120 mcg on 9/18   Past Medical History:  Diagnosis Date   Anemia    Heart failure (HCC)    Hypertension    Osteoporosis    Renal disorder     Past Surgical History:  Procedure Laterality Date   AV FISTULA PLACEMENT Right    HEART TRANSPLANT     THORACENTESIS Right 06/22/2021   Procedure: THORACENTESIS;  Surgeon: Leslye Peer, MD;  Location: [facility] ENDOSCOPY;  Service: Cardiopulmonary;  Laterality: Right;    No family history on file.  Social History:  reports that he has never smoked. He has never used smokeless  tobacco. He reports that he does not drink alcohol and does not use drugs.  Allergies:  Allergies  Allergen Reactions   Transderm-Scop [Scopolamine] Other (See Comments)    Hallucinations Induces severe delirium    Ativan [Lorazepam] Other (See Comments)    Hallucinations Severe somnolence/delirium     Medications: I have reviewed the patient's current medications.   Results for orders placed or performed during the hospital encounter of 07/21/23 (from the past 48 hour(s))  Resp panel by RT-PCR (RSV, Flu A&B, Covid) Anterior Nasal Swab     Status: None   Collection Time: 07/21/23  4:21 AM   Specimen: Anterior Nasal Swab  Result Value Ref Range   SARS Coronavirus 2 by RT PCR NEGATIVE NEGATIVE   Influenza A by PCR NEGATIVE NEGATIVE   Influenza B by PCR NEGATIVE NEGATIVE    Comment: (NOTE) The Xpert Xpress SARS-CoV-2/FLU/RSV plus assay is intended as an aid in the diagnosis of influenza from Nasopharyngeal swab specimens and should not be used as a sole basis for treatment. Nasal washings and aspirates are unacceptable for Xpert Xpress SARS-CoV-2/FLU/RSV testing.  Fact Sheet for Patients: BloggerCourse.com  Fact Sheet for Healthcare Providers: SeriousBroker.it  This test is not yet approved or cleared by the [place] FDA and has been authorized for detection and/or diagnosis of SARS-CoV-2 by FDA under an Emergency Use Authorization (EUA). This EUA will remain in effect (meaning this test can be used) for the duration of  the COVID-19 declaration under Section 564(b)(1) of the Act, 21 U.S.C. section 360bbb-3(b)(1), unless the authorization is terminated or revoked.     Resp Syncytial Virus by PCR NEGATIVE NEGATIVE    Comment: (NOTE) Fact Sheet for Patients: BloggerCourse.com  Fact Sheet for Healthcare Providers: SeriousBroker.it  This test is not yet approved or  cleared by the [place] FDA and has been authorized for detection and/or diagnosis of SARS-CoV-2 by FDA under an Emergency Use Authorization (EUA). This EUA will remain in effect (meaning this test can be used) for the duration of the COVID-19 declaration under Section 564(b)(1) of the Act, 21 U.S.C. section 360bbb-3(b)(1), unless the authorization is terminated or revoked.  Performed at [facility], [address], [place], [place] [address]   I-Stat arterial blood gas, ED     Status: Abnormal   Collection Time: 07/21/23  4:31 AM  Result Value Ref Range   pH, Arterial 7.060 (LL) 7.35 - 7.45   pCO2 arterial 107.3 (HH) 32 - 48 mmHg   pO2, Arterial 94 83 - 108 mmHg   Bicarbonate 30.5 (H) 20.0 - 28.0 mmol/L   TCO2 34 (H) 22 - 32 mmol/L   O2 Saturation 92 %   Acid-base deficit 3.0 (H) 0.0 - 2.0 mmol/L   Sodium 132 (L) 135 - 145 mmol/L   Potassium 5.4 (H) 3.5 - 5.1 mmol/L   Calcium, Ion 1.21 1.15 - 1.40 mmol/L   HCT 42.0 39.0 - 52.0 %   Hemoglobin 14.3 13.0 - 17.0 g/dL   Patient temperature 21.3 F    Sample type ARTERIAL    Comment NOTIFIED PHYSICIAN   Comprehensive metabolic panel     Status: Abnormal   Collection Time: 07/21/23  4:45 AM  Result Value Ref Range   Sodium 135 135 - 145 mmol/L   Potassium 5.6 (H) 3.5 - 5.1 mmol/L   Chloride 98 98 - 111 mmol/L   CO2 22 22 - 32 mmol/L   Glucose, Bld 142 (H) 70 - 99 mg/dL    Comment: Glucose reference range applies only to samples taken after fasting for at least 8 hours.   BUN 44 (H) 8 - 23 mg/dL   Creatinine, Ser 0.86 (H) 0.61 - 1.24 mg/dL   Calcium 8.3 (L) 8.9 - 10.3 mg/dL   Total Protein 6.8 6.5 - 8.1 g/dL   Albumin 3.7 3.5 - 5.0 g/dL   AST 15 15 - 41 U/L   ALT 11 0 - 44 U/L   Alkaline Phosphatase 63 38 - 126 U/L   Total Bilirubin 0.2 (L) 0.3 - 1.2 mg/dL   GFR, Estimated 8 (L) >60 mL/min    Comment: (NOTE) Calculated using the CKD-EPI Creatinine Equation (2021)    Anion gap 15 5 - 15    Comment: Performed at  [facility], [address] [place], [place] [address]  CBC with Differential     Status: Abnormal   Collection Time: 07/21/23  4:45 AM  Result Value Ref Range   WBC 7.2 4.0 - 10.5 K/uL   RBC 4.52 4.22 - 5.81 MIL/uL   Hemoglobin 12.7 (L) 13.0 - 17.0 g/dL   HCT 96.2 95.2 - 84.1 %   MCV 100.2 (H) 80.0 - 100.0 fL   MCH 28.1 26.0 - 34.0 pg   MCHC 28.0 (L) 30.0 - 36.0 g/dL   RDW 32.4 40.1 - 02.7 %   Platelets 194 150 - 400 K/uL   nRBC 0.0 0.0 - 0.2 %  Neutrophils Relative % 73 %   Neutro Abs 5.3 1.7 - 7.7 K/uL   Lymphocytes Relative 5 %   Lymphs Abs 0.4 (L) 0.7 - 4.0 K/uL   Monocytes Relative 13 %   Monocytes Absolute 0.9 0.1 - 1.0 K/uL   Eosinophils Relative 1 %   Eosinophils Absolute 0.1 0.0 - 0.5 K/uL   Basophils Relative 0 %   Basophils Absolute 0.0 0.0 - 0.1 K/uL   Immature Granulocytes 8 %   Abs Immature Granulocytes 0.58 (H) 0.00 - 0.07 K/uL    Comment: Performed at [facility], [address], [place], [place] [address]  Protime-INR     Status: None   Collection Time: 07/21/23  4:45 AM  Result Value Ref Range   Prothrombin Time 13.5 11.4 - 15.2 seconds   INR 1.0 0.8 - 1.2    Comment: (NOTE) INR goal varies based on device and disease states. Performed at [facility], [address], [place], [place] [address]   APTT     Status: None   Collection Time: 07/21/23  4:45 AM  Result Value Ref Range   aPTT 32 24 - 36 seconds    Comment: Performed at [facility], [address], [place], [place] [address]  Troponin I (High Sensitivity)     Status: None   Collection Time: 07/21/23  4:45 AM  Result Value Ref Range   Troponin I (High Sensitivity) 8 <18 ng/L    Comment: (NOTE) Elevated high sensitivity troponin I (hsTnI) values and significant  changes across serial measurements may suggest ACS but many other  chronic and acute conditions are known to elevate hsTnI results.  Refer to the "Links" section for chest pain algorithms and additional   guidance. Performed at [facility], [address], [place], [place] [address]   I-Stat venous blood gas, [facility] ED, [facility], [facility])     Status: Abnormal   Collection Time: 07/21/23  5:03 AM  Result Value Ref Range   pH, Ven 7.154 (LL) 7.25 - 7.43   pCO2, Ven 78.1 (HH) 44 - 60 mmHg   pO2, Ven 68 (H) 32 - 45 mmHg   Bicarbonate 27.4 20.0 - 28.0 mmol/L   TCO2 30 22 - 32 mmol/L   O2 Saturation 86 %   Acid-base deficit 4.0 (H) 0.0 - 2.0 mmol/L   Sodium 133 (L) 135 - 145 mmol/L   Potassium 5.6 (H) 3.5 - 5.1 mmol/L   Calcium, Ion 1.10 (L) 1.15 - 1.40 mmol/L   HCT 46.0 39.0 - 52.0 %   Hemoglobin 15.6 13.0 - 17.0 g/dL   Sample type VENOUS    Comment NOTIFIED PHYSICIAN   I-Stat Lactic Acid, ED     Status: None   Collection Time: 07/21/23  5:04 AM  Result Value Ref Range   Lactic Acid, Venous 1.0 0.5 - 1.9 mmol/L  Troponin I (High Sensitivity)     Status: None   Collection Time: 07/21/23  6:21 AM  Result Value Ref Range   Troponin I (High Sensitivity) 8 <18 ng/L    Comment: (NOTE) Elevated high sensitivity troponin I (hsTnI) values and significant  changes across serial measurements may suggest ACS but many other  chronic and acute conditions are known to elevate hsTnI results.  Refer to the "Links" section for chest pain algorithms and additional  guidance. Performed at [facility], [address], [place], [place] [address]   Ammonia  Status: Abnormal   Collection Time: 07/21/23  6:22 AM  Result Value Ref Range   Ammonia 57 (H) 9 - 35 umol/L    Comment: Performed at [facility], [address], [place], [place] [address]  I-Stat venous blood gas, [facility] ED, [facility], [facility])     Status: Abnormal   Collection Time: 07/21/23  6:31 AM  Result Value Ref Range   pH, Ven 7.104 (LL) 7.25 - 7.43   pCO2, Ven 79.8 (HH) 44 - 60 mmHg   pO2, Ven 56 (H) 32 - 45 mmHg   Bicarbonate 25.0 20.0 - 28.0 mmol/L   TCO2 27 22 - 32 mmol/L   O2 Saturation 75 %   Acid-base deficit 6.0 (H) 0.0 -  2.0 mmol/L   Sodium 137 135 - 145 mmol/L   Potassium 4.9 3.5 - 5.1 mmol/L   Calcium, Ion 1.07 (L) 1.15 - 1.40 mmol/L   HCT 40.0 39.0 - 52.0 %   Hemoglobin 13.6 13.0 - 17.0 g/dL   Sample type VENOUS    Comment NOTIFIED PHYSICIAN   I-Stat Lactic Acid, ED     Status: None   Collection Time: 07/21/23  6:31 AM  Result Value Ref Range   Lactic Acid, Venous 1.0 0.5 - 1.9 mmol/L  I-Stat arterial blood gas, ED     Status: Abnormal   Collection Time: 07/21/23  7:05 AM  Result Value Ref Range   pH, Arterial 7.109 (LL) 7.35 - 7.45   pCO2 arterial 85.7 (HH) 32 - 48 mmHg   pO2, Arterial 25 (LL) 83 - 108 mmHg   Bicarbonate 27.6 20.0 - 28.0 mmol/L   TCO2 30 22 - 32 mmol/L   O2 Saturation 30 %   Acid-base deficit 4.0 (H) 0.0 - 2.0 mmol/L   Sodium 131 (L) 135 - 145 mmol/L   Potassium 5.1 3.5 - 5.1 mmol/L   Calcium, Ion 1.15 1.15 - 1.40 mmol/L   HCT 38.0 (L) 39.0 - 52.0 %   Hemoglobin 12.9 (L) 13.0 - 17.0 g/dL   Patient temperature 60.4 F    Sample type ARTERIAL    Comment NOTIFIED PHYSICIAN     DG Chest Port 1 View  Result Date: 07/21/2023 CLINICAL DATA:  Questionable sepsis.  Evaluate for abnormality. EXAM: PORTABLE CHEST 1 VIEW COMPARISON:  03/07/2023 FINDINGS: Portable view of the chest demonstrates a dialysis catheter that terminates in the right atrium this is most compatible with a HeRO graft. Increased densities in the mid and lower right lung is suggestive for increased pleural fluid. There also appears to be increased pleural-based densities at the left lung base and along the lateral left chest. Cardiac silhouette appears to be enlarged. Patient has median sternotomy wires. Cannot exclude enlarged central vascular structures. IMPRESSION: 1. Increased densities in the mid and lower right chest are concerning for pleural fluid and possibly lung consolidation. There is also concern for increased pleural based disease in the left lung. 2. Cardiomegaly with postsurgical changes. Electronically  Signed   By: Richarda Overlie M.D.   On: 07/21/2023 06:46    ROS: As per H&P, rest of systems reviewed and negative. Blood pressure (!) 103/58, pulse (!) 103, temperature (!) 96.6 F (35.9 C), temperature source Rectal, resp. rate (!) 25, SpO2 100%. Gen: NAD, comfortable Respiratory: Clear bilateral, no wheezing or crackle Cardiovascular: Regular rate rhythm S1-S2 normal, no rubs GI: Abdomen soft, nontender, nondistended Extremities, no cyanosis or clubbing, no edema Skin: No rash or ulcer Neurology: Alert, awake, following commands,  oriented Dialysis Access:  Assessment/Plan:  # Acute hypoxic and hypercapnic respiratory failure with respiratory acidosis/pleural effusion: Currently on BiPAP for respiratory support.  Try to UF with HD today in ICU.  The team is trying to contact [facility] because of history of heart transplant.  # ESRD: MWF.  Blood pressure is soft but this is his baseline and he takes midodrine for IDH.  We will attempt to do intermittent hemodialysis in ICU, UF as tolerated.  AV graft for the access.  Discussed with ICU team.  If he is not able to tolerate conventional HD then he will need CRRT for fluid removal.  # Hypertension/volume: UF with HD, not on antihypertensive, midodrine for IDH.  # Anemia of ESRD: Hemoglobin at goal.  Received Mircera on 9/18.  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  Monitor calcium, phosphorus.    Discussed with ICU team, dialysis nurse, patient and his sister today. Thank you for the consult, we will continue to follow. Resean Brander Jaynie Collins 07/21/2023, 9:59 AM

## 2023-07-21 NOTE — Progress Notes (Signed)
Rt obtained abg x3 but when it was ran it was a Venous sample Will notify provider and nurse

## 2023-07-21 NOTE — Progress Notes (Signed)
Seaver Machia [id number] Admission Data: 07/21/2023 2:28 PM Attending Provider: Martina Sinner, MD  NWG:NFAO, Lindi Adie, MD (Inactive) Consults/ Treatment Team: Treatment Team:  Maxie Barb, MD  Nabeel Gladson is a 67 y.o. male patient admitted from ED awake, alert  & orientated  X 4  Full Code, VSS - Blood pressure 98/60, pulse (!) 113, temperature 97.7 F (36.5 C), temperature source Axillary, resp. rate (!) 30, weight 62.2 kg, SpO2 100%., O2   BiPAP  25M-10 placed and pt is currently running:normal sinus rhythm.   Skin, clean-dry- intact without evidence of bruising, or skin tears.   No evidence of skin break down noted on exam.  Pt admit to 3 [place] room 10.     Will cont to monitor and assist as needed.  [address] [place], RN 07/21/2023 2:28 PM

## 2023-07-22 ENCOUNTER — Inpatient Hospital Stay (HOSPITAL_COMMUNITY): Payer: Medicare Other

## 2023-07-22 ENCOUNTER — Encounter (HOSPITAL_COMMUNITY): Payer: Self-pay | Admitting: Pulmonary Disease

## 2023-07-22 DIAGNOSIS — R6521 Severe sepsis with septic shock: Secondary | ICD-10-CM

## 2023-07-22 DIAGNOSIS — J9601 Acute respiratory failure with hypoxia: Secondary | ICD-10-CM | POA: Diagnosis not present

## 2023-07-22 DIAGNOSIS — A419 Sepsis, unspecified organism: Secondary | ICD-10-CM | POA: Diagnosis not present

## 2023-07-22 DIAGNOSIS — J90 Pleural effusion, not elsewhere classified: Secondary | ICD-10-CM | POA: Diagnosis not present

## 2023-07-22 DIAGNOSIS — J9602 Acute respiratory failure with hypercapnia: Secondary | ICD-10-CM | POA: Diagnosis not present

## 2023-07-22 LAB — PROTEIN, PLEURAL OR PERITONEAL FLUID: Total protein, fluid: 3.6 g/dL

## 2023-07-22 LAB — BODY FLUID CELL COUNT WITH DIFFERENTIAL
Eos, Fluid: 0 %
Lymphs, Fluid: 93 %
Monocyte-Macrophage-Serous Fluid: 4 % — ABNORMAL LOW (ref 50–90)
Neutrophil Count, Fluid: 3 % (ref 0–25)
Total Nucleated Cell Count, Fluid: 374 mm3 (ref 0–1000)

## 2023-07-22 LAB — LACTATE DEHYDROGENASE: LDH: 98 U/L (ref 98–192)

## 2023-07-22 LAB — PROTEIN, TOTAL: Total Protein: 5.9 g/dL — ABNORMAL LOW (ref 6.5–8.1)

## 2023-07-22 LAB — HEPATITIS B SURFACE ANTIBODY, QUANTITATIVE: Hep B S AB Quant (Post): 4347 m[IU]/mL

## 2023-07-22 LAB — LACTATE DEHYDROGENASE, PLEURAL OR PERITONEAL FLUID: LD, Fluid: 65 U/L — ABNORMAL HIGH (ref 3–23)

## 2023-07-22 MED ORDER — ACETAMINOPHEN 500 MG PO TABS
1000.0000 mg | ORAL_TABLET | Freq: Four times a day (QID) | ORAL | Status: DC | PRN
  Administered 2023-07-22 – 2023-07-23 (×2): 1000 mg via ORAL
  Filled 2023-07-22 (×2): qty 2

## 2023-07-22 MED ORDER — ENSURE ENLIVE PO LIQD
237.0000 mL | Freq: Two times a day (BID) | ORAL | Status: DC
  Administered 2023-07-25: 237 mL via ORAL

## 2023-07-22 MED ORDER — FENTANYL CITRATE PF 50 MCG/ML IJ SOSY: 50.0000 ug | PREFILLED_SYRINGE | Freq: Four times a day (QID) | INTRAMUSCULAR | Status: DC | PRN

## 2023-07-22 MED ORDER — INFLUENZA VAC A&B SURF ANT ADJ 0.5 ML IM SUSY
0.5000 mL | PREFILLED_SYRINGE | INTRAMUSCULAR | Status: DC
  Filled 2023-07-22: qty 0.5

## 2023-07-22 MED ORDER — PNEUMOCOCCAL 20-VAL CONJ VACC 0.5 ML IM SUSY
0.5000 mL | PREFILLED_SYRINGE | INTRAMUSCULAR | Status: DC
  Filled 2023-07-22: qty 0.5

## 2023-07-22 MED ORDER — ATORVASTATIN CALCIUM 10 MG PO TABS
20.0000 mg | ORAL_TABLET | Freq: Every day | ORAL | Status: DC
  Administered 2023-07-22 – 2023-07-26 (×5): 20 mg via ORAL
  Filled 2023-07-22 (×5): qty 2

## 2023-07-22 MED ORDER — MIRTAZAPINE 30 MG PO TABS
30.0000 mg | ORAL_TABLET | Freq: Every day | ORAL | Status: DC
  Administered 2023-07-22 – 2023-07-25 (×4): 30 mg via ORAL
  Filled 2023-07-22 (×7): qty 1

## 2023-07-22 NOTE — Progress Notes (Signed)
[facility] NEPHROLOGY PROGRESS NOTE  Assessment/ Plan: Pt is a 67 y.o. yo male   OP HD orders:  Dialyzes at  [facility], MWF, 4 hours,  EDW 60 kg 2K 3ca,RUE HeRo Graft, Mircera 120 mcg on 9/18  # Acute hypoxic and hypercapnic respiratory failure with respiratory acidosis/pleural effusion:  Received dialysis yesterday with 2.9 L UF.  Off of BiPAP and on 2 L of oxygen.  Clinically feeling better.  Per pulmonary team.   # ESRD: MWF.  Receives midodrine for intradialytic hypotension.  Status post HD yesterday and tolerated well except hypotensive episode.  UF around 2.9 L.  Clinically much better.  Plan for next HD on Monday.  Vascular access is AV graft.   # BP/volume: UF with HD, not on antihypertensive, midodrine for IDH.   # Anemia of ESRD: Hemoglobin at goal.  Received Mircera on 9/18.   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  Monitor calcium, phosphorus.   Discussed with ICU team, patient's sister.  Subjective: Seen and examined in ICU.  He is on chair and feels much better.  On 2 L of oxygen.  Denies, nausea or vomiting.  No new event. Objective Vital signs in last 24 hours: Vitals:   07/22/23 0600 07/22/23 0615 07/22/23 0700 07/22/23 0752  BP: (!) 136/55 (!) 124/55 (!) 126/58   Pulse: (!) 110 (!) 115 (!) 112 (!) 112  Resp: (!) 21 20 (!) 21 (!) 22  Temp:    98.4 F (36.9 C)  TempSrc:    Oral  SpO2: 100% 100% 99% 97%  Weight:       Weight change:   Intake/Output Summary (Last 24 hours) at 07/22/2023 1140 Last data filed at 07/22/2023 0600 Gross per 24 hour  Intake 442.96 ml  Output 2900 ml  Net -2457.04 ml       Labs: RENAL PANEL Recent Labs  Lab 07/21/23 0445 07/21/23 0503 07/21/23 0631 07/21/23 0705 07/21/23 0919 07/21/23 1246 07/21/23 1302  NA 135 133* 137 131*  --  137 137  K 5.6* 5.6* 4.9 5.1  --  5.1 4.3  CL 98  --   --   --   --  105  --   CO2 22  --   --   --   --  21*  --   GLUCOSE 142*  --   --   --   --  68*  --   BUN 44*  --   --    --   --  44*  --   CREATININE 6.73*  --   --   --   --  6.83*  --   CALCIUM 8.3*  --   --   --   --  7.7*  --   MG  --   --   --   --  2.0  --   --   PHOS  --   --   --   --  5.6* 5.2*  --   ALBUMIN 3.7  --   --   --   --  2.8*  --     Liver Function Tests: Recent Labs  Lab 07/21/23 0445 07/21/23 1246  AST 15  --   ALT 11  --   ALKPHOS 63  --   BILITOT 0.2*  --   PROT 6.8  --   ALBUMIN 3.7 2.8*   Recent Labs  Lab 07/21/23 0919  LIPASE 43  AMYLASE 43   Recent  Labs  Lab 07/21/23 0622  AMMONIA 57*   CBC: Recent Labs    07/21/23 0445 07/21/23 0503 07/21/23 0631 07/21/23 0705 07/21/23 1246 07/21/23 1302  HGB 12.7* 15.6 13.6 12.9* 10.6* 12.6*  MCV 100.2*  --   --   --  101.4*  --     Cardiac Enzymes: No results for input(s): "CKTOTAL", "CKMB", "CKMBINDEX", "TROPONINI" in the last 168 hours. CBG: Recent Labs  Lab 07/21/23 1025 07/21/23 1926  GLUCAP 86 92    Iron Studies: No results for input(s): "IRON", "TIBC", "TRANSFERRIN", "FERRITIN" in the last 72 hours. Studies/Results: [place] EKG SITE RITE  Result Date: 07/21/2023 If Site Rite image not attached, placement could not be confirmed due to current cardiac rhythm.  DG Chest Port 1 View  Result Date: 07/21/2023 CLINICAL DATA:  Questionable sepsis.  Evaluate for abnormality. EXAM: PORTABLE CHEST 1 VIEW COMPARISON:  03/07/2023 FINDINGS: Portable view of the chest demonstrates a dialysis catheter that terminates in the right atrium this is most compatible with a HeRO graft. Increased densities in the mid and lower right lung is suggestive for increased pleural fluid. There also appears to be increased pleural-based densities at the left lung base and along the lateral left chest. Cardiac silhouette appears to be enlarged. Patient has median sternotomy wires. Cannot exclude enlarged central vascular structures. IMPRESSION: 1. Increased densities in the mid and lower right chest are concerning for pleural fluid and  possibly lung consolidation. There is also concern for increased pleural based disease in the left lung. 2. Cardiomegaly with postsurgical changes. Electronically Signed   By: Richarda Overlie M.D.   On: 07/21/2023 06:46    Medications: Infusions:  sodium chloride     sodium chloride Stopped (07/21/23 1627)   albumin human     anticoagulant sodium citrate     azithromycin 250 mL/hr at 07/22/23 0600   cefTRIAXone (ROCEPHIN)  IV Stopped (07/22/23 0542)   norepinephrine (LEVOPHED) Adult infusion 5 mcg/min (07/22/23 0600)   [START ON 07/24/2023] vancomycin      Scheduled Medications:  arformoterol  15 mcg Nebulization BID   Chlorhexidine Gluconate Cloth  6 each Topical Q0600   mycophenolate  180 mg Oral BID   pantoprazole  40 mg Oral Daily   revefenacin  175 mcg Nebulization Daily   sodium chloride flush  3 mL Intravenous Q12H   tacrolimus  6 mg Oral BID    have reviewed scheduled and prn medications.  Physical Exam: General:NAD, comfortable Heart:RRR, s1s2 nl Lungs: Basal decreased breath sound, no crackle, no increased work of breathing Abdomen:soft, Non-tender, non-distended Extremities:No edema Dialysis Access: Right upper extremity hero graft.  Swain Acree Prasad Khadijah Mastrianni 07/22/2023,11:40 AM  LOS: 1 day

## 2023-07-22 NOTE — Progress Notes (Signed)
NAME:  Rickey Erickson, MRN:  [id number], DOB:  [birth year], LOS: 1 ADMISSION DATE:  07/21/2023, CONSULTATION DATE: 07/21/2023 REFERRING MD: Emergency department, CHIEF COMPLAINT: Somnolence cardiac respiratory failure  History of Present Illness:  67 year old male status post heart transplant 2021 following a massive myocardial infarction.  Patient had multiple thoracentesis over the last 2 years and presents with 10 days of somnolence noted to be hypercarbic in the ER placed on BiPAP with improvement.  He gets dialysis Monday Wednesdays and Fridays he is due for treatment today.  He usually receives his care at [facility] but we have been notified that they are not accepting patients due to lack of beds.  He will be admitted to COVID intensive care unit we will stop his Plavix and attempt thoracentesis at a later date.  Pertinent  Medical History   Past Medical History:  Diagnosis Date   Anemia    Heart failure (HCC)    Hypertension    Osteoporosis    Renal disorder      Significant Hospital Events: Including procedures, antibiotic start and stop dates in addition to other pertinent events     Interim History / Subjective:  Remain afebrile Still requiring vasopressor support with Levophed BiPAP was transitioned off, currently on nasal cannula oxygen Stated feeling better  Objective   Blood pressure (!) 126/58, pulse (!) 112, temperature 98.4 F (36.9 C), temperature source Oral, resp. rate (!) 22, weight 61.4 kg, SpO2 97%.    FiO2 (%):  [50 %] 50 % PEEP:  [5 cmH20] 5 cmH20   Intake/Output Summary (Last 24 hours) at 07/22/2023 9528 Last data filed at 07/22/2023 0600 Gross per 24 hour  Intake 2548.44 ml  Output 2900 ml  Net -351.56 ml   Filed Weights   07/21/23 1153 07/21/23 1639 07/22/23 0500  Weight: 62.2 kg 59.3 kg 61.4 kg    Examination: General: Acute on chronically ill-appearing male, lying on the bed, on nasal cannula oxygen HEENT: [place]/AT, eyes anicteric.   moist mucus membranes Neuro: Alert, awake following commands Chest: Reduced air entry at the bases bilaterally right more than left, no wheezes or rhonchi Heart: Regular rate and rhythm, no murmurs or gallops Abdomen: Soft, nontender, nondistended, bowel sounds present Skin: No rash  Labs and images reviewed  Resolved Hospital Problem list     Assessment & Plan:  Acute hypoxic/hypercarbic respiratory failure  Sepsis with septic shock, due to bilateral lower lobe pneumonia, POA Acute metabolic encephalopathy in the setting of hypercapnia Complex right-sided pleural effusion with septations status post multiple thoracentesis Patient came off of BiPAP, currently on nasal cannula oxygen Hypercapnia has improved Continue to require vasopressor support with MAP of 65, currently on Levophed Mental status has improved Bedside ultrasonography showed complex right-sided pleural effusion with septations Will proceed with small bore chest tube and possible he will require tPA DNase once fluid is drained Continue broad-spectrum antibiotics, currently on vancomycin, ceftriaxone and azithromycin Follow-up cultures  S/p heart transplant currently on Prograf Continue immunosuppressive therapy Holding Plavix and Eliquis at this time for chest tube placement Will resume after  End-stage renal disease with dialysis Monday Wednesdays and Fridays Received hemodialysis yesterday, nephrology is following   Best Practice (right click and "Reselect all SmartList Selections" daily)   Diet/type: Regular consistency DVT prophylaxis: DOAC GI prophylaxis: PPI Lines: Dialysis Catheter Foley:  Yes, and it is still needed Code Status:  full code Last date of multidisciplinary goals of care: 9/28 patient and his wife were updated at  bedside, decision was to continue full scope of care Wife up Labs   CBC: Recent Labs  Lab 07/21/23 0445 07/21/23 0503 07/21/23 0631 07/21/23 0705 07/21/23 1246  07/21/23 1302  WBC 7.2  --   --   --  5.2  --   NEUTROABS 5.3  --   --   --   --   --   HGB 12.7* 15.6 13.6 12.9* 10.6* 12.6*  HCT 45.3 46.0 40.0 38.0* 36.7* 37.0*  MCV 100.2*  --   --   --  101.4*  --   PLT 194  --   --   --  153  --     Basic Metabolic Panel: Recent Labs  Lab 07/21/23 0445 07/21/23 0503 07/21/23 0631 07/21/23 0705 07/21/23 0919 07/21/23 1246 07/21/23 1302  NA 135 133* 137 131*  --  137 137  K 5.6* 5.6* 4.9 5.1  --  5.1 4.3  CL 98  --   --   --   --  105  --   CO2 22  --   --   --   --  21*  --   GLUCOSE 142*  --   --   --   --  68*  --   BUN 44*  --   --   --   --  44*  --   CREATININE 6.73*  --   --   --   --  6.83*  --   CALCIUM 8.3*  --   --   --   --  7.7*  --   MG  --   --   --   --  2.0  --   --   PHOS  --   --   --   --  5.6* 5.2*  --    GFR: Estimated Creatinine Clearance: 9.4 mL/min (A) (by C-G formula based on SCr of 6.83 mg/dL (H)). Recent Labs  Lab 07/21/23 0445 07/21/23 0504 07/21/23 0631 07/21/23 0919 07/21/23 1246  PROCALCITON  --   --   --  0.31  --   WBC 7.2  --   --   --  5.2  LATICACIDVEN  --  1.0 1.0  --   --     Liver Function Tests: Recent Labs  Lab 07/21/23 0445 07/21/23 1246  AST 15  --   ALT 11  --   ALKPHOS 63  --   BILITOT 0.2*  --   PROT 6.8  --   ALBUMIN 3.7 2.8*   Recent Labs  Lab 07/21/23 0919  LIPASE 43  AMYLASE 43   Recent Labs  Lab 07/21/23 0622  AMMONIA 57*    ABG    Component Value Date/Time   PHART 7.227 (L) 07/21/2023 1302   PCO2ART 56.0 (H) 07/21/2023 1302   PO2ART 83 07/21/2023 1302   HCO3 23.3 07/21/2023 1302   TCO2 25 07/21/2023 1302   ACIDBASEDEF 5.0 (H) 07/21/2023 1302   O2SAT 93 07/21/2023 1302     Coagulation Profile: Recent Labs  Lab 07/21/23 0445  INR 1.0    Cardiac Enzymes: No results for input(s): "CKTOTAL", "CKMB", "CKMBINDEX", "TROPONINI" in the last 168 hours.  HbA1C: Hgb A1c MFr Bld  Date/Time Value Ref Range Status  01/06/2022 08:09 AM 4.9 4.8 - 5.6 %  Final    Comment:    (NOTE)         Prediabetes: 5.7 - 6.4  Diabetes: >6.4         Glycemic control for adults with diabetes: <7.0     CBG: Recent Labs  Lab 07/21/23 1025 07/21/23 1926  GLUCAP 86 92    The patient is critically ill due to shock due to pneumonia and complex pleural effusion, s/p renal transplant on immunosuppression.  Critical care was necessary to treat or prevent imminent or life-threatening deterioration.  Critical care was time spent personally by me on the following activities: development of treatment plan with patient and/or surrogate as well as nursing, discussions with consultants, evaluation of patient's response to treatment, examination of patient, obtaining history from patient or surrogate, ordering and performing treatments and interventions, ordering and review of laboratory studies, ordering and review of radiographic studies, pulse oximetry, re-evaluation of patient's condition and participation in multidisciplinary rounds.   During this encounter critical care time was devoted to patient care services described in this note for 39 minutes.     Cheri Fowler, MD [facility] See Amion for pager If no response to pager, please call [phone] until 7pm After 7pm, Please call E-link [phone]

## 2023-07-22 NOTE — Progress Notes (Signed)
Patient arrived to the floor from ED. Chest tube site clean, dry, and intact. Current output 1250. Patient a/ox4 not currently complaining of pain and no needs at this time.

## 2023-07-22 NOTE — Procedures (Signed)
Insertion of Chest Tube Procedure Note  Dennys Guin  [id number]  1956-06-28  Date:07/22/23  Time:3:22 PM    Provider Performing: Cheri Fowler   Procedure: Pleural Catheter Insertion w/ Imaging Guidance (40981)  Indication(s) Effusion  Consent Risks of the procedure as well as the alternatives and risks of each were explained to the patient and/or caregiver.  Consent for the procedure was obtained and is signed in the bedside chart  Anesthesia Topical only with 1% lidocaine    Time Out Verified patient identification, verified procedure, site/side was marked, verified correct patient position, special equipment/implants available, medications/allergies/relevant history reviewed, required imaging and test results available.   Sterile Technique Maximal sterile technique including full sterile barrier drape, hand hygiene, sterile gown, sterile gloves, mask, hair covering, sterile ultrasound probe cover (if used).   Procedure Description Ultrasound used to identify appropriate pleural anatomy for placement and overlying skin marked. Area of placement cleaned and draped in sterile fashion.  A 14 French pigtail pleural catheter was placed into the right pleural space using Seldinger technique. Appropriate return of fluid was obtained.  The tube was connected to atrium and placed on -20 cm H2O wall suction.   Complications/Tolerance None; patient tolerated the procedure well. Chest X-ray is ordered to verify placement.   EBL Minimal  Specimen(s) fluid

## 2023-07-22 NOTE — Progress Notes (Signed)
Discussed PICC order with Dr. Ronalee Belts and Clovis Riley, RN. No PICC line per nephrologist. RN reports pt has adequate PIV access. Order canceled.

## 2023-07-23 DIAGNOSIS — J9602 Acute respiratory failure with hypercapnia: Secondary | ICD-10-CM | POA: Diagnosis not present

## 2023-07-23 DIAGNOSIS — J90 Pleural effusion, not elsewhere classified: Secondary | ICD-10-CM | POA: Diagnosis not present

## 2023-07-23 MED ORDER — SODIUM CHLORIDE (PF) 0.9 % IJ SOLN
10.0000 mg | Freq: Once | INTRAMUSCULAR | Status: AC
  Administered 2023-07-23: 10 mg via INTRAPLEURAL
  Filled 2023-07-23: qty 10

## 2023-07-23 MED ORDER — SENNA 8.6 MG PO TABS
2.0000 | ORAL_TABLET | Freq: Two times a day (BID) | ORAL | Status: DC
  Administered 2023-07-23 – 2023-07-27 (×8): 17.2 mg via ORAL
  Filled 2023-07-23 (×8): qty 2

## 2023-07-23 MED ORDER — SODIUM CHLORIDE 0.9% FLUSH
10.0000 mL | Freq: Three times a day (TID) | INTRAVENOUS | Status: DC
  Administered 2023-07-23 – 2023-07-27 (×12): 10 mL via INTRAPLEURAL

## 2023-07-23 MED ORDER — CHLORHEXIDINE GLUCONATE CLOTH 2 % EX PADS
6.0000 | MEDICATED_PAD | Freq: Every day | CUTANEOUS | Status: DC
  Administered 2023-07-23: 6 via TOPICAL

## 2023-07-23 MED ORDER — CLOPIDOGREL BISULFATE 75 MG PO TABS
75.0000 mg | ORAL_TABLET | Freq: Every morning | ORAL | Status: DC
  Administered 2023-07-23 – 2023-07-27 (×5): 75 mg via ORAL
  Filled 2023-07-23 (×5): qty 1

## 2023-07-23 MED ORDER — STERILE WATER FOR INJECTION IJ SOLN
5.0000 mg | Freq: Once | RESPIRATORY_TRACT | Status: AC
  Administered 2023-07-23: 5 mg via INTRAPLEURAL
  Filled 2023-07-23: qty 5

## 2023-07-23 MED ORDER — BISACODYL 5 MG PO TBEC
5.0000 mg | DELAYED_RELEASE_TABLET | Freq: Every day | ORAL | Status: DC | PRN
  Administered 2023-07-23: 5 mg via ORAL
  Filled 2023-07-23: qty 1

## 2023-07-23 MED ORDER — APIXABAN 2.5 MG PO TABS
2.5000 mg | ORAL_TABLET | Freq: Two times a day (BID) | ORAL | Status: DC
  Administered 2023-07-23 – 2023-07-27 (×9): 2.5 mg via ORAL
  Filled 2023-07-23 (×9): qty 1

## 2023-07-23 MED ORDER — ACETAMINOPHEN 325 MG PO TABS: 650.0000 mg | ORAL_TABLET | Freq: Four times a day (QID) | ORAL | Status: DC | PRN

## 2023-07-23 MED ORDER — POLYETHYLENE GLYCOL 3350 17 G PO PACK
17.0000 g | PACK | Freq: Every day | ORAL | Status: DC | PRN
  Administered 2023-07-23: 17 g via ORAL
  Filled 2023-07-23 (×2): qty 1

## 2023-07-23 MED ORDER — BISACODYL 10 MG RE SUPP
10.0000 mg | Freq: Once | RECTAL | Status: AC
  Administered 2023-07-23: 10 mg via RECTAL
  Filled 2023-07-23: qty 1

## 2023-07-23 NOTE — Progress Notes (Signed)
Rickey Erickson  MWN:[id number] DOB: February 15, 1956 DOA: 07/21/2023 PCP: Carin Hock, MD (Inactive)    Brief Narrative:  67 year old with a history of heart transplant in 2021 following a devastating MI, ESRD on HD, HTN, and recurrent pleural effusions requiring drainage who presented to the hospital 9/27 with a 10-day history of progressive somnolence and shortness of breath.  In the ER he was found to be in acute hypercarbic respiratory failure and required initiation of BiPAP.  CXR noted a significant right pleural effusion.  He had most recently undergone a thoracentesis at [facility] on 9/19 with 700 cc of fluid produced.  Significant Events:  9/27 admit via PCCM  9/28 pigtail chest tube placed by PCCM  9/29 care assumed by [facility]  Goals of Care:   Code Status: Full Code   DVT prophylaxis: SCDs Start: 07/21/23 4034  Interim Hx: Afebrile.  Vital signs stable.  Oxygen saturation 99% on 1 L nasal cannula.  Reports some constipation today.  Otherwise the patient states he is doing quite well.  He feels much better.  His family states that he looks dramatically improved.  He reports that his symptoms improved most significantly and very rapidly after his pigtail chest tube was placed allowing drainage of the fluid from his chest cavity.  Assessment & Plan:  Acute hypoxic and hypercarbic respiratory failure - resolved Required BiPAP at initial presentation with symptoms of severe dyspnea -now successfully weaned to room air  Bilateral lower lobe pneumonia with septic shock Required vasopressor support with Levophed during initial hospital stay-blood pressure now stable without support  Complex right pleural effusion with septations Has a history of requiring multiple prior thoracentesis procedures -status post placement of pigtail chest tube 9/28 by critical care -ongoing tube and effusion management per critical care -pleural fluid studies consistent with exudate per Light's criteria -culture  negative thus far  Acute metabolic encephalopathy due to hypercarbia Resolved with clinical improvement and utilization of BiPAP  Status post heart transplant Continue usual immunosuppressive therapy with Prograf - PCCM has been in touch with the transplant team at [facility]  ESRD on HD MWF Nephrology attending to ongoing dialysis  Anemia of chronic kidney disease Erythropoietin per nephrology team  Family Communication: Spoke with sister at bedside Disposition: Anticipate eventual discharge home   Objective: Blood pressure (!) 110/58, pulse 99, temperature (!) 96.4 F (35.8 C), temperature source Axillary, resp. rate 20, weight 61.4 kg, SpO2 99%.  Intake/Output Summary (Last 24 hours) at 07/23/2023 0833 Last data filed at 07/23/2023 0600 Gross per 24 hour  Intake --  Output 1390 ml  Net -1390 ml   Filed Weights   07/21/23 1153 07/21/23 1639 07/22/23 0500  Weight: 62.2 kg 59.3 kg 61.4 kg    Examination: General: No acute respiratory distress Lungs: Clear to auscultation bilaterally -mild blunting of breath sounds in bilateral bases without wheezing Cardiovascular: Regular rate and rhythm  Abdomen: Nontender, nondistended, soft, bowel sounds positive, no rebound, no ascites, no appreciable mass Extremities: No significant cyanosis, clubbing, or edema bilateral lower extremities  CBC: Recent Labs  Lab 07/21/23 0445 07/21/23 0503 07/21/23 0705 07/21/23 1246 07/21/23 1302  WBC 7.2  --   --  5.2  --   NEUTROABS 5.3  --   --   --   --   HGB 12.7*   < > 12.9* 10.6* 12.6*  HCT 45.3   < > 38.0* 36.7* 37.0*  MCV 100.2*  --   --  101.4*  --   PLT  194  --   --  153  --    < > = values in this interval not displayed.   Basic Metabolic Panel: Recent Labs  Lab 07/21/23 0445 07/21/23 0503 07/21/23 0705 07/21/23 0919 07/21/23 1246 07/21/23 1302  NA 135   < > 131*  --  137 137  K 5.6*   < > 5.1  --  5.1 4.3  CL 98  --   --   --  105  --   CO2 22  --   --   --  21*  --    GLUCOSE 142*  --   --   --  68*  --   BUN 44*  --   --   --  44*  --   CREATININE 6.73*  --   --   --  6.83*  --   CALCIUM 8.3*  --   --   --  7.7*  --   MG  --   --   --  2.0  --   --   PHOS  --   --   --  5.6* 5.2*  --    < > = values in this interval not displayed.   GFR: Estimated Creatinine Clearance: 9.2 mL/min (A) (by C-G formula based on SCr of 6.83 mg/dL (H)).   Scheduled Meds:  arformoterol  15 mcg Nebulization BID   atorvastatin  20 mg Oral q1800   Chlorhexidine Gluconate Cloth  6 each Topical Q0600   feeding supplement  237 mL Oral BID BM   influenza vaccine adjuvanted  0.5 mL Intramuscular Tomorrow-1000   mirtazapine  30 mg Oral QHS   mycophenolate  180 mg Oral BID   pantoprazole  40 mg Oral Daily   pneumococcal 20-valent conjugate vaccine  0.5 mL Intramuscular Tomorrow-1000   revefenacin  175 mcg Nebulization Daily   sodium chloride flush  3 mL Intravenous Q12H   tacrolimus  6 mg Oral BID   Continuous Infusions:  sodium chloride     sodium chloride Stopped (07/21/23 1627)   albumin human     anticoagulant sodium citrate     azithromycin 500 mg (07/23/23 0619)   cefTRIAXone (ROCEPHIN)  IV 2 g (07/23/23 0519)   norepinephrine (LEVOPHED) Adult infusion 5 mcg/min (07/22/23 0600)   [START ON 07/24/2023] vancomycin       LOS: 2 days   Lonia Blood, MD [facility] Office  [phone] Pager - Text Page per Loretha Stapler  If 7PM-7AM, please contact night-coverage per Amion 07/23/2023, 8:33 AM

## 2023-07-23 NOTE — Procedures (Signed)
Pleural Fibrinolytic Administration Procedure Note  Rickey Erickson  [id number]  January 11, 1956  Date:07/23/23  Time:6:24 PM   Provider Performing:Lashan Gluth   Procedure: Pleural Fibrinolysis Initial day [phone])  Indication(s) Fibrinolysis of complicated pleural effusion  Consent Risks of the procedure as well as the alternatives and risks of each were explained to the patient and/or caregiver.  Consent for the procedure was obtained.   Anesthesia None   Time Out Verified patient identification, verified procedure, site/side was marked, verified correct patient position, special equipment/implants available, medications/allergies/relevant history reviewed, required imaging and test results available.   Sterile Technique Hand hygiene, gloves   Procedure Description Existing pleural catheter was cleaned and accessed in sterile manner.  10mg  of tPA in 30cc of saline and 5mg  of dornase in 30cc of sterile water were injected into pleural space using existing pleural catheter.  Catheter will be clamped for 1 hour and then placed back to suction.   Complications/Tolerance None; patient tolerated the procedure well.  EBL None   Specimen(s) None

## 2023-07-23 NOTE — Progress Notes (Addendum)
[facility] NEPHROLOGY PROGRESS NOTE  Assessment/ Plan: Pt is a 67 y.o. yo male   OP HD orders:  Dialyzes at  [facility], MWF, 4 hours,  EDW 60 kg 2K 3ca,RUE HeRo Graft, Mircera 120 mcg on 9/18  # Acute hypoxic and hypercapnic respiratory failure with respiratory acidosis/pleural effusion:  Received dialysis on 9/27 with 2.9 L UF. Status post chest tube placement on 9/28 with almost 1.4 L fluid off. Currently on 1 to 2 L of oxygen and clinically feeling much better. Also on broad-spectrum antibiotics per pulmonary team.   # ESRD: MWF.  Receives midodrine for intradialytic hypotension.  Plan for next HD on Monday.  Vascular access is AV graft.   # BP/volume: UF with HD, not on antihypertensive, midodrine for IDH.   # Anemia of ESRD: Hemoglobin at goal.  Received Mircera on 9/18.   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  Monitor calcium, phosphorus.   Discussed  patient's sister.  Subjective: Seen and examined in ICU.  He was moved from ICU to the floor.  Reports his breathing is much better.  Using around 1 L of oxygen.  No chest pain, nausea or vomiting.  His sister was presented with him.  Objective Vital signs in last 24 hours: Vitals:   07/23/23 0009 07/23/23 0315 07/23/23 0822 07/23/23 0848  BP: (!) 114/57 (!) 110/58  108/63  Pulse: (!) 103 100 99 99  Resp: 18 16 20 20   Temp:  (!) 96.4 F (35.8 C)  97.6 F (36.4 C)  TempSrc:  Axillary  Oral  SpO2: 100% 100% 99% 99%  Weight:       Weight change:   Intake/Output Summary (Last 24 hours) at 07/23/2023 1030 Last data filed at 07/23/2023 0600 Gross per 24 hour  Intake --  Output 1390 ml  Net -1390 ml       Labs: RENAL PANEL Recent Labs  Lab 07/21/23 0445 07/21/23 0503 07/21/23 0631 07/21/23 0705 07/21/23 0919 07/21/23 1246 07/21/23 1302  NA 135 133* 137 131*  --  137 137  K 5.6* 5.6* 4.9 5.1  --  5.1 4.3  CL 98  --   --   --   --  105  --   CO2 22  --   --   --   --  21*  --   GLUCOSE 142*   --   --   --   --  68*  --   BUN 44*  --   --   --   --  44*  --   CREATININE 6.73*  --   --   --   --  6.83*  --   CALCIUM 8.3*  --   --   --   --  7.7*  --   MG  --   --   --   --  2.0  --   --   PHOS  --   --   --   --  5.6* 5.2*  --   ALBUMIN 3.7  --   --   --   --  2.8*  --     Liver Function Tests: Recent Labs  Lab 07/21/23 0445 07/21/23 1246 07/22/23 1559  AST 15  --   --   ALT 11  --   --   ALKPHOS 63  --   --   BILITOT 0.2*  --   --   PROT 6.8  --  5.9*  ALBUMIN  3.7 2.8*  --    Recent Labs  Lab 07/21/23 0919  LIPASE 43  AMYLASE 43   Recent Labs  Lab 07/21/23 0622  AMMONIA 57*   CBC: Recent Labs    07/21/23 0445 07/21/23 0503 07/21/23 0631 07/21/23 0705 07/21/23 1246 07/21/23 1302  HGB 12.7* 15.6 13.6 12.9* 10.6* 12.6*  MCV 100.2*  --   --   --  101.4*  --     Cardiac Enzymes: No results for input(s): "CKTOTAL", "CKMB", "CKMBINDEX", "TROPONINI" in the last 168 hours. CBG: Recent Labs  Lab 07/21/23 1025 07/21/23 1926  GLUCAP 86 92    Iron Studies: No results for input(s): "IRON", "TIBC", "TRANSFERRIN", "FERRITIN" in the last 72 hours. Studies/Results: DG CHEST PORT 1 VIEW  Result Date: 07/22/2023 CLINICAL DATA:  Status post chest tube placement. EXAM: PORTABLE CHEST 1 VIEW COMPARISON:  Chest radiograph dated 07/21/2023. FINDINGS: The heart is enlarged. Vascular calcifications are seen in the aortic arch. There has been interval placement of a right-sided pleural pigtail catheter with interval decreased size of a right pleural effusion with associated atelectasis. There remains trace right pleural effusion and mild right atelectasis. There are two areas of apparent kinking of the pleural catheter. A left pleural effusion with associated atelectasis/airspace disease appears unchanged. A vascular graft terminating in the right atrium is redemonstrated. IMPRESSION: 1. Interval placement of a right-sided pleural pigtail catheter with interval decreased  size of a right pleural effusion. There are two areas of apparent kinking of the pleural catheter. 2. Unchanged left pleural effusion with associated atelectasis/airspace disease. Electronically Signed   By: Romona Curls M.D.   On: 07/22/2023 16:06   [place] EKG SITE RITE  Result Date: 07/21/2023 If Site Rite image not attached, placement could not be confirmed due to current cardiac rhythm.   Medications: Infusions:  sodium chloride     albumin human     anticoagulant sodium citrate     azithromycin 500 mg (07/23/23 9604)   cefTRIAXone (ROCEPHIN)  IV 2 g (07/23/23 0519)   [START ON 07/24/2023] vancomycin      Scheduled Medications:  apixaban  2.5 mg Oral BID   arformoterol  15 mcg Nebulization BID   atorvastatin  20 mg Oral q1800   Chlorhexidine Gluconate Cloth  6 each Topical Q0600   clopidogrel  75 mg Oral q morning   feeding supplement  237 mL Oral BID BM   influenza vaccine adjuvanted  0.5 mL Intramuscular Tomorrow-1000   mirtazapine  30 mg Oral QHS   mycophenolate  180 mg Oral BID   pantoprazole  40 mg Oral Daily   pneumococcal 20-valent conjugate vaccine  0.5 mL Intramuscular Tomorrow-1000   revefenacin  175 mcg Nebulization Daily   sodium chloride flush  3 mL Intravenous Q12H   tacrolimus  6 mg Oral BID    have reviewed scheduled and prn medications.  Physical Exam: General:NAD, comfortable Heart:RRR, s1s2 nl Lungs: No increased work of breathing, chest tube in place. Abdomen:soft, Non-tender, non-distended Extremities:No edema Dialysis Access: Right upper extremity hero graft.  Lillyth Spong Prasad Ladina Shutters 07/23/2023,10:30 AM  LOS: 2 days

## 2023-07-23 NOTE — Plan of Care (Signed)

## 2023-07-23 NOTE — Progress Notes (Signed)
NAME:  Rickey Erickson, MRN:  [id number], DOB:  07-28-1956, LOS: 2 ADMISSION DATE:  07/21/2023, CONSULTATION DATE: 07/21/2023 REFERRING MD: Emergency department, CHIEF COMPLAINT: Somnolence cardiac respiratory failure  History of Present Illness:  67 year old male status post heart transplant 2021 following a massive myocardial infarction.  Patient had multiple thoracentesis over the last 2 years and presents with 10 days of somnolence noted to be hypercarbic in the ER placed on BiPAP with improvement.  He gets dialysis Monday Wednesdays and Fridays he is due for treatment today.  He usually receives his care at [facility] but we have been notified that they are not accepting patients due to lack of beds.  He will be admitted to COVID intensive care unit we will stop his Plavix and attempt thoracentesis at a later date.  Pertinent  Medical History   Past Medical History:  Diagnosis Date   Anemia    Heart failure (HCC)    Hypertension    Osteoporosis    Renal disorder      Significant Hospital Events: Including procedures, antibiotic start and stop dates in addition to other pertinent events     Interim History / Subjective:  Remain afebrile Complaining of chest tube site pain when he moves around Stated he is having hard time passing stool due to severe constipation Oxygen was titrated off to room air  Objective   Blood pressure 132/66, pulse (!) 108, temperature 97.8 F (36.6 C), temperature source Oral, resp. rate 20, weight 61.4 kg, SpO2 92%.        Intake/Output Summary (Last 24 hours) at 07/23/2023 1742 Last data filed at 07/23/2023 0600 Gross per 24 hour  Intake --  Output 1390 ml  Net -1390 ml   Filed Weights   07/21/23 1153 07/21/23 1639 07/22/23 0500  Weight: 62.2 kg 59.3 kg 61.4 kg    Examination: Physical exam: General: Elderly male, lying on the bed HEENT: [place]/AT, eyes anicteric.  moist mucus membranes Neuro: Alert, awake following commands Chest:  Coarse breath sounds, no wheezes or rhonchi.  Right-sided chest tube in place, draining yellowish fluid Heart: Regular rate and rhythm, no murmurs or gallops Abdomen: Soft, nontender, nondistended, bowel sounds present Skin: No rash  Labs and images reviewed  Resolved Hospital Problem list     Assessment & Plan:  Acute hypoxic/hypercarbic respiratory failure  Bilateral lower lobe pneumonia Complex right-sided pleural effusion with septations status post multiple thoracentesis Patient is on room air now Continue IV antibiotics Chest tube output was 1390 cc in last 24 hours Now chest tube output has decreased Pleural fluid studies are consistent with exudative effusion Will place tPA/dornase and chest tube now, we may need to repeat tomorrow Repeat x-ray chest in the morning  Rest of the management per primary team, PCCM will follow along for chest tube management   Best Practice (right click and "Reselect all SmartList Selections" daily)  Per primary team  Labs   CBC: Recent Labs  Lab 07/21/23 0445 07/21/23 0503 07/21/23 0631 07/21/23 0705 07/21/23 1246 07/21/23 1302  WBC 7.2  --   --   --  5.2  --   NEUTROABS 5.3  --   --   --   --   --   HGB 12.7* 15.6 13.6 12.9* 10.6* 12.6*  HCT 45.3 46.0 40.0 38.0* 36.7* 37.0*  MCV 100.2*  --   --   --  101.4*  --   PLT 194  --   --   --  153  --     Basic Metabolic Panel: Recent Labs  Lab 07/21/23 0445 07/21/23 0503 07/21/23 0631 07/21/23 0705 07/21/23 0919 07/21/23 1246 07/21/23 1302  NA 135 133* 137 131*  --  137 137  K 5.6* 5.6* 4.9 5.1  --  5.1 4.3  CL 98  --   --   --   --  105  --   CO2 22  --   --   --   --  21*  --   GLUCOSE 142*  --   --   --   --  68*  --   BUN 44*  --   --   --   --  44*  --   CREATININE 6.73*  --   --   --   --  6.83*  --   CALCIUM 8.3*  --   --   --   --  7.7*  --   MG  --   --   --   --  2.0  --   --   PHOS  --   --   --   --  5.6* 5.2*  --    GFR: Estimated Creatinine Clearance:  9.2 mL/min (A) (by C-G formula based on SCr of 6.83 mg/dL (H)). Recent Labs  Lab 07/21/23 0445 07/21/23 0504 07/21/23 0631 07/21/23 0919 07/21/23 1246  PROCALCITON  --   --   --  0.31  --   WBC 7.2  --   --   --  5.2  LATICACIDVEN  --  1.0 1.0  --   --     Liver Function Tests: Recent Labs  Lab 07/21/23 0445 07/21/23 1246 07/22/23 1559  AST 15  --   --   ALT 11  --   --   ALKPHOS 63  --   --   BILITOT 0.2*  --   --   PROT 6.8  --  5.9*  ALBUMIN 3.7 2.8*  --    Recent Labs  Lab 07/21/23 0919  LIPASE 43  AMYLASE 43   Recent Labs  Lab 07/21/23 0622  AMMONIA 57*    ABG    Component Value Date/Time   PHART 7.227 (L) 07/21/2023 1302   PCO2ART 56.0 (H) 07/21/2023 1302   PO2ART 83 07/21/2023 1302   HCO3 23.3 07/21/2023 1302   TCO2 25 07/21/2023 1302   ACIDBASEDEF 5.0 (H) 07/21/2023 1302   O2SAT 93 07/21/2023 1302     Coagulation Profile: Recent Labs  Lab 07/21/23 0445  INR 1.0    Cardiac Enzymes: No results for input(s): "CKTOTAL", "CKMB", "CKMBINDEX", "TROPONINI" in the last 168 hours.  HbA1C: Hgb A1c MFr Bld  Date/Time Value Ref Range Status  01/06/2022 08:09 AM 4.9 4.8 - 5.6 % Final    Comment:    (NOTE)         Prediabetes: 5.7 - 6.4         Diabetes: >6.4         Glycemic control for adults with diabetes: <7.0     CBG: Recent Labs  Lab 07/21/23 1025 07/21/23 1926  GLUCAP 86 92      Cheri Fowler, MD [facility] See Amion for pager If no response to pager, please call [phone] until 7pm After 7pm, Please call E-link [phone]

## 2023-07-24 ENCOUNTER — Inpatient Hospital Stay (HOSPITAL_COMMUNITY): Payer: Medicare Other

## 2023-07-24 DIAGNOSIS — J9602 Acute respiratory failure with hypercapnia: Secondary | ICD-10-CM | POA: Diagnosis not present

## 2023-07-24 LAB — COMPREHENSIVE METABOLIC PANEL
ALT: 12 U/L (ref 0–44)
AST: 13 U/L — ABNORMAL LOW (ref 15–41)
Albumin: 3.1 g/dL — ABNORMAL LOW (ref 3.5–5.0)
Alkaline Phosphatase: 50 U/L (ref 38–126)
Anion gap: 17 — ABNORMAL HIGH (ref 5–15)
BUN: 75 mg/dL — ABNORMAL HIGH (ref 8–23)
CO2: 21 mmol/L — ABNORMAL LOW (ref 22–32)
Calcium: 7.6 mg/dL — ABNORMAL LOW (ref 8.9–10.3)
Chloride: 95 mmol/L — ABNORMAL LOW (ref 98–111)
Creatinine, Ser: 8.27 mg/dL — ABNORMAL HIGH (ref 0.61–1.24)
GFR, Estimated: 7 mL/min — ABNORMAL LOW (ref >60)
Glucose, Bld: 100 mg/dL — ABNORMAL HIGH (ref 70–99)
Potassium: 4.4 mmol/L (ref 3.5–5.1)
Sodium: 133 mmol/L — ABNORMAL LOW (ref 135–145)
Total Bilirubin: 0.5 mg/dL (ref 0.3–1.2)
Total Protein: 5.7 g/dL — ABNORMAL LOW (ref 6.5–8.1)

## 2023-07-24 LAB — CBC
HCT: 34.1 % — ABNORMAL LOW (ref 39.0–52.0)
Hemoglobin: 10 g/dL — ABNORMAL LOW (ref 13.0–17.0)
MCH: 27.9 pg (ref 26.0–34.0)
MCHC: 29.3 g/dL — ABNORMAL LOW (ref 30.0–36.0)
MCV: 95.3 fL (ref 80.0–100.0)
Platelets: 117 10*3/uL — ABNORMAL LOW (ref 150–400)
RBC: 3.58 MIL/uL — ABNORMAL LOW (ref 4.22–5.81)
RDW: 14.6 % (ref 11.5–15.5)
WBC: 5.3 10*3/uL (ref 4.0–10.5)
nRBC: 0 % (ref 0.0–0.2)

## 2023-07-24 LAB — MAGNESIUM: Magnesium: 2.1 mg/dL (ref 1.7–2.4)

## 2023-07-24 LAB — PATHOLOGIST SMEAR REVIEW

## 2023-07-24 NOTE — Evaluation (Signed)
Physical Therapy Evaluation Patient Details Name: Rickey Erickson MRN: [id number] DOB: November 15, 1955 Today's Date: 07/24/2023  History of Present Illness  67 y/o male presents [facility] 07/21/23 with SOB and not feeling well, admitted with acute hypercarbic respiratory failure, B pneumonia, R pleural effusion. Chest tube placed 9/28, left ICU 9/29.  PMHx: ESRD on HD, heart transplant 2021 following MI, multiple thoracentesis, anemia, HTN, osteoporosis   Clinical Impression  Pt in bed upon arrival with sister present. Agreeable to PT eval. Pt presents below functional mobility baseline with decreased strength, decreased balance, decreased functional mobility, and general deconditioning. At baseline, pt ambulates with no AD and no assistance for mobility. Pt currently requires minimal assistance for bed mobility and to stand. Pt leans posteriorly upon standing upright and requires ModA to maintain balance. Pt was able to ambulate 10 feet before reporting fatigue and requiring a rest break. Pt also needed an increase from 1L [place] to 3L [place] to maintain O2 sats above 90%. Pt would benefit from <3 hours post acute rehab to return to functional mobility baseline. Acute PT to follow.         If plan is discharge home, recommend the following: A little help with walking and/or transfers;Help with stairs or ramp for entrance;Assistance with cooking/housework   Can travel by private vehicle   Yes    Equipment Recommendations None recommended by PT  Recommendations for Other Services       Functional Status Assessment Patient has had a recent decline in their functional status and demonstrates the ability to make significant improvements in function in a reasonable and predictable amount of time.     Precautions / Restrictions Precautions Precautions: Fall Restrictions Weight Bearing Restrictions: No Other Position/Activity Restrictions: chest tube      Mobility  Bed Mobility Overal bed mobility: Needs  Assistance Bed Mobility: Supine to Sit     Supine to sit: Min assist     General bed mobility comments: increased time, MinA to support trunk. Pt leaned posteriorly upon sitting upright    Transfers Overall transfer level: Needs assistance Equipment used: Rolling walker (2 wheels) Transfers: Sit to/from Stand Sit to Stand: Min assist           General transfer comment: increased time, MinA for initial rise and steady.    Ambulation/Gait Ambulation/Gait assistance: Min assist Gait Distance (Feet): 10 Feet Assistive device: Rolling walker (2 wheels) Gait Pattern/deviations: Step-through pattern, Shuffle, Trunk flexed Gait velocity: dec     General Gait Details: slow and deliberate steps, pt had difficulty managing RW and required MinA for turns. Cues to push RW forwards to take steps        Balance Overall balance assessment: Needs assistance, Mild deficits observed, not formally tested Sitting-balance support: Feet supported, Bilateral upper extremity supported Sitting balance-Leahy Scale: Poor   Postural control: Posterior lean Standing balance support: Bilateral upper extremity supported, During functional activity, Reliant on assistive device for balance Standing balance-Leahy Scale: Poor Standing balance comment: Pt with heavy posterior lean upon standing requiring ModA to correct           Pertinent Vitals/Pain Pain Assessment Pain Assessment: No/denies pain    Home Living Family/patient expects to be discharged to:: Private residence Living Arrangements: Other relatives (sister) Available Help at Discharge: Family;Available 24 hours/day Type of Home: House Home Access: Stairs to enter Entrance Stairs-Rails: Right Entrance Stairs-Number of Steps: 4   Home Layout: Two level;Able to live on main level with bedroom/bathroom Home Equipment: Tub bench;BSC/3in1;Rolling Walker (2  wheels);Rollator (4 wheels);Cane - single point;Grab bars -  tub/shower;Wheelchair - manual      Prior Function Prior Level of Function : Independent/Modified Independent             Mobility Comments: uses cane outside, no AD in the house ADLs Comments: assistance with cooking     Extremity/Trunk Assessment   Upper Extremity Assessment Upper Extremity Assessment: Overall WFL for tasks assessed    Lower Extremity Assessment Lower Extremity Assessment: Generalized weakness    Cervical / Trunk Assessment Cervical / Trunk Assessment: Kyphotic  Communication   Communication Communication: Hearing impairment  Cognition Arousal: Alert Behavior During Therapy: WFL for tasks assessed/performed Overall Cognitive Status: Within Functional Limits for tasks assessed           General Comments General comments (skin integrity, edema, etc.): on 1L [place] pt desatted to 82%, required bump to 3L with 92%        Assessment/Plan    PT Assessment Patient needs continued PT services  PT Problem List Decreased strength;Decreased activity tolerance;Decreased balance;Decreased mobility;Decreased safety awareness;Decreased knowledge of use of DME       PT Treatment Interventions DME instruction;Neuromuscular re-education;Gait training;Stair training;Functional mobility training;Therapeutic activities;Therapeutic exercise;Balance training    PT Goals (Current goals can be found in the Care Plan section)  Acute Rehab PT Goals Patient Stated Goal: to get stronger PT Goal Formulation: With patient/family Time For Goal Achievement: 08/07/23 Potential to Achieve Goals: Good    Frequency Min 1X/week        AM-PAC PT "6 Clicks" Mobility  Outcome Measure Help needed turning from your back to your side while in a flat bed without using bedrails?: A Little Help needed moving from lying on your back to sitting on the side of a flat bed without using bedrails?: A Little Help needed moving to and from a bed to a chair (including a wheelchair)?: A  Lot Help needed standing up from a chair using your arms (e.g., wheelchair or bedside chair)?: A Lot Help needed to walk in hospital room?: A Little Help needed climbing 3-5 steps with a railing? : A Lot 6 Click Score: 15    End of Session Equipment Utilized During Treatment: Oxygen Activity Tolerance: Patient limited by fatigue Patient left: in bed;with call bell/phone within reach (transport present to take pt to HD) Nurse Communication: Mobility status (02 requirements) PT Visit Diagnosis: Other abnormalities of gait and mobility (R26.89);Muscle weakness (generalized) (M62.81)    Time: 1610-9604 PT Time Calculation (min) (ACUTE ONLY): 24 min   Charges:   PT Evaluation $PT Eval Low Complexity: 1 Low   PT General Charges $$ ACUTE PT VISIT: 1 Visit         Hilton Cork, PT, DPT Secure Chat Preferred  Rehab Office [phone]  Arturo Morton Brion Aliment 07/24/2023, 2:53 PM

## 2023-07-24 NOTE — Progress Notes (Signed)
Received patient in bed to unit.  Alert and oriented.  Informed consent signed and in chart.   TX duration: 3 hours and 18 minutes  Patient tolerated well.  Transported back to the room  Alert, without acute distress.  Hand-off given to patient's nurse.   Access used: Right Upper arm graft Access issues: Cartridge clotted 12 minutes early.  Total UF removed: 1.8L Medication(s) given: none   07/24/23 1854  Vitals  Temp 97.6 F (36.4 C)  Temp Source Oral  BP 113/76  BP Location Left Arm  BP Method Manual  Patient Position (if appropriate) Lying  Pulse Rate (!) 112  Pulse Rate Source Monitor  ECG Heart Rate (!) 112  Resp (!) 21  MEWS COLOR  MEWS Score Color Yellow  Oxygen Therapy  SpO2 100 %  O2 Device Nasal Cannula  O2 Flow Rate (L/min) 2 L/min  MEWS Score  MEWS Temp 0  MEWS Systolic 0  MEWS Pulse 2  MEWS RR 1  MEWS LOC 0  MEWS Score 3     Stacie Glaze LPN [facility]

## 2023-07-24 NOTE — Progress Notes (Signed)
Rickey Erickson  KKX:[id number] DOB: November 09, 1955 DOA: 07/21/2023 PCP: Carin Hock, MD (Inactive)    Brief Narrative:  67 year old with a history of heart transplant in 2021 following a devastating MI, ESRD on HD, HTN, and recurrent pleural effusions requiring drainage who presented to the hospital 9/27 with a 10-day history of progressive somnolence and shortness of breath.  In the ER he was found to be in acute hypercarbic respiratory failure and required initiation of BiPAP.  CXR noted a significant right pleural effusion.  He had most recently undergone a thoracentesis at [facility] on 9/19 with 700 cc of fluid produced.  Significant Events:  9/27 admit via PCCM  9/28 pigtail chest tube placed by PCCM  9/29 care assumed by [facility]  Goals of Care:   Code Status: Full Code   DVT prophylaxis: apixaban (ELIQUIS) tablet 2.5 mg Start: 07/23/23 1000 apixaban (ELIQUIS) tablet 2.5 mg   Interim Hx: tPA was instilled in the patient's chest tube 9/29 by PCCM.  No acute events were reported overnight.  Afebrile.  Some mild persistent sinus tachycardia with heart rate 10 6-1 10.  Blood pressure stable.  Oxygen saturation 96% on room air.  The patient is alert and pleasant but mildly confused today.  There are no focal neurologic deficits.  He denies shortness of breath at present.  He denies chest pain.  Assessment & Plan:  Acute hypoxic and hypercarbic respiratory failure - resolved Required BiPAP at initial presentation with symptoms of severe dyspnea -now successfully weaned to minimal O2 support at 1-2 L nasal cannula  Bilateral lower lobe pneumonia with septic shock Required vasopressor support with Levophed during initial hospital stay-blood pressure now stable without support  Complex right pleural effusion with septations Has a history of requiring multiple prior thoracentesis procedures -status post placement of pigtail chest tube 9/28 by critical care -ongoing tube and effusion management  per critical care -pleural fluid studies consistent with exudate per Light's criteria -culture negative thus far -tPA instilled in tube 9/29  Transient confusion Likely simply low-grade sundowning -no focal neurologic deficits -follow -check B12 and folic acid levels  Persisting sinus tachycardia Has been consistent pretty much since admission -is not on chronic beta-blocker therapy as patient has difficulty with hypotension during dialysis requiring midodrine -cortisol normal during this hospital stay -will check TSH  Acute metabolic encephalopathy due to hypercarbia Resolved with clinical improvement and utilization of BiPAP  Status post heart transplant Continue usual immunosuppressive therapy with Prograf - PCCM has been in touch with the transplant team at [facility]  ESRD on HD MWF Nephrology attending to ongoing dialysis  Anemia of chronic kidney disease Erythropoietin per Nephrology team  Family Communication: Spoke with sister at bedside Disposition: Anticipate eventual discharge home   Objective: Blood pressure 113/68, pulse (!) 110, temperature 97.9 F (36.6 C), temperature source Oral, resp. rate (!) 24, weight 66.5 kg, SpO2 96%.  Intake/Output Summary (Last 24 hours) at 07/24/2023 0833 Last data filed at 07/24/2023 0602 Gross per 24 hour  Intake --  Output 950 ml  Net -950 ml   Filed Weights   07/21/23 1639 07/22/23 0500 07/24/23 0448  Weight: 59.3 kg 61.4 kg 66.5 kg    Examination: General: No acute respiratory distress -alert but mildly confused Lungs: Clear to auscultation bilaterally -mild blunting of breath sounds in bilateral bases without wheezing Cardiovascular: Regular rate and rhythm -no murmur Abdomen: Nontender, nondistended, soft, bowel sounds positive, no rebound, no ascites, no appreciable mass Extremities: No significant edema B LE  CBC: Recent Labs  Lab 07/21/23 0445 07/21/23 0503 07/21/23 1246 07/21/23 1302 07/24/23 0503  WBC 7.2  --  5.2   --  5.3  NEUTROABS 5.3  --   --   --   --   HGB 12.7*   < > 10.6* 12.6* 10.0*  HCT 45.3   < > 36.7* 37.0* 34.1*  MCV 100.2*  --  101.4*  --  95.3  PLT 194  --  153  --  117*   < > = values in this interval not displayed.   Basic Metabolic Panel: Recent Labs  Lab 07/21/23 0445 07/21/23 0503 07/21/23 0919 07/21/23 1246 07/21/23 1302 07/24/23 0503  NA 135   < >  --  137 137 133*  K 5.6*   < >  --  5.1 4.3 4.4  CL 98  --   --  105  --  95*  CO2 22  --   --  21*  --  21*  GLUCOSE 142*  --   --  68*  --  100*  BUN 44*  --   --  44*  --  75*  CREATININE 6.73*  --   --  6.83*  --  8.27*  CALCIUM 8.3*  --   --  7.7*  --  7.6*  MG  --   --  2.0  --   --  2.1  PHOS  --   --  5.6* 5.2*  --   --    < > = values in this interval not displayed.   GFR: Estimated Creatinine Clearance: 8.3 mL/min (A) (by C-G formula based on SCr of 8.27 mg/dL (H)).   Scheduled Meds:  apixaban  2.5 mg Oral BID   arformoterol  15 mcg Nebulization BID   atorvastatin  20 mg Oral q1800   clopidogrel  75 mg Oral q morning   feeding supplement  237 mL Oral BID BM   influenza vaccine adjuvanted  0.5 mL Intramuscular Tomorrow-1000   mirtazapine  30 mg Oral QHS   mycophenolate  180 mg Oral BID   pantoprazole  40 mg Oral Daily   pneumococcal 20-valent conjugate vaccine  0.5 mL Intramuscular Tomorrow-1000   revefenacin  175 mcg Nebulization Daily   senna  2 tablet Oral BID   sodium chloride flush  10 mL Intrapleural Q8H   sodium chloride flush  3 mL Intravenous Q12H   tacrolimus  6 mg Oral BID   Continuous Infusions:  sodium chloride     albumin human     anticoagulant sodium citrate     azithromycin 500 mg (07/24/23 0654)   cefTRIAXone (ROCEPHIN)  IV 2 g (07/24/23 0602)   vancomycin       LOS: 3 days   Lonia Blood, MD [facility] Office  [phone] Pager - Text Page per Loretha Stapler  If 7PM-7AM, please contact night-coverage per Amion 07/24/2023, 8:33 AM

## 2023-07-24 NOTE — Progress Notes (Signed)
[facility] Progress Note   Subjective:   Patient seen and examined at bedside. Feeling better today. Three BM over night.  Breathing improved, on 1L O2 at night, which is baseline per family.  Denies abdominal pain, CP and n/v/d.    Objective Vitals:   07/24/23 0002 07/24/23 0448 07/24/23 0814 07/24/23 1149  BP: 135/62 131/65 113/68 119/70  Pulse: (!) 110 (!) 109 (!) 110 (!) 105  Resp:  18 (!) 24 16  Temp:  (!) 97.5 F (36.4 C) 97.9 F (36.6 C) 97.7 F (36.5 C)  TempSrc:  Oral Oral Axillary  SpO2:  98% 96% 100%  Weight:  66.5 kg     Physical Exam General:chronically ill appearing, alert male in NAD Heart:RRR, no mrg Lungs:CTAB, nml WOB on 1L O2, R chest tube in place with serosanguinous drainage Abdomen:soft, NTND Extremities:no LE edema Dialysis Access: RU AVF +b/t   Filed Weights   07/21/23 1639 07/22/23 0500 07/24/23 0448  Weight: 59.3 kg 61.4 kg 66.5 kg    Intake/Output Summary (Last 24 hours) at 07/24/2023 1250 Last data filed at 07/24/2023 0800 Gross per 24 hour  Intake 240 ml  Output 950 ml  Net -710 ml    Additional Objective Labs: Basic Metabolic Panel: Recent Labs  Lab 07/21/23 0445 07/21/23 0503 07/21/23 0919 07/21/23 1246 07/21/23 1302 07/24/23 0503  NA 135   < >  --  137 137 133*  K 5.6*   < >  --  5.1 4.3 4.4  CL 98  --   --  105  --  95*  CO2 22  --   --  21*  --  21*  GLUCOSE 142*  --   --  68*  --  100*  BUN 44*  --   --  44*  --  75*  CREATININE 6.73*  --   --  6.83*  --  8.27*  CALCIUM 8.3*  --   --  7.7*  --  7.6*  PHOS  --   --  5.6* 5.2*  --   --    < > = values in this interval not displayed.   Liver Function Tests: Recent Labs  Lab 07/21/23 0445 07/21/23 1246 07/22/23 1559 07/24/23 0503  AST 15  --   --  13*  ALT 11  --   --  12  ALKPHOS 63  --   --  50  BILITOT 0.2*  --   --  0.5  PROT 6.8  --  5.9* 5.7*  ALBUMIN 3.7 2.8*  --  3.1*   Recent Labs  Lab 07/21/23 0919  LIPASE 43  AMYLASE 43    CBC: Recent Labs  Lab 07/21/23 0445 07/21/23 0503 07/21/23 1246 07/21/23 1302 07/24/23 0503  WBC 7.2  --  5.2  --  5.3  NEUTROABS 5.3  --   --   --   --   HGB 12.7*   < > 10.6* 12.6* 10.0*  HCT 45.3   < > 36.7* 37.0* 34.1*  MCV 100.2*  --  101.4*  --  95.3  PLT 194  --  153  --  117*   < > = values in this interval not displayed.   Blood Culture    Component Value Date/Time   SDES PLEURAL 07/22/2023 1612   SPECREQUEST NONE 07/22/2023 1612   CULT  07/22/2023 1612    NO GROWTH 2 DAYS Performed at [facility], [address], [place],  [place] [address]    REPTSTATUS PENDING 07/22/2023 1612   CBG: Recent Labs  Lab 07/21/23 1025 07/21/23 1926  GLUCAP 86 92    Studies/Results: DG CHEST PORT 1 VIEW  Result Date: 07/24/2023 CLINICAL DATA:  Status post chest tube placement. EXAM: PORTABLE CHEST 1 VIEW COMPARISON:  Radiograph 07/22/2023. FINDINGS: Right-sided dialysis catheter in place. Stable positioning of right basilar pigtail catheter. 1 of the areas of kinking is unchanged, 1 of the areas of kinking has slightly straightened from prior exam. No convincing pneumothorax. Small amount of pleural fluid may persist at the right lung base with adjacent streaky basilar opacity. Left basilar pleural effusion and opacity is similar. No pulmonary edema. Stable heart size and mediastinal contours. IMPRESSION: 1. Stable positioning of right basilar pigtail catheter. 1 of the areas of kinking is unchanged, 1 of the areas of kinking has slightly straightened from prior exam. No convincing pneumothorax. 2. Small amount of pleural fluid may persist at the right lung base with adjacent streaky basilar opacity. Left basilar pleural effusion and opacity is similar. Electronically Signed   By: Narda Rutherford M.D.   On: 07/24/2023 10:50   DG CHEST PORT 1 VIEW  Result Date: 07/22/2023 CLINICAL DATA:  Status post chest tube placement. EXAM: PORTABLE CHEST 1 VIEW COMPARISON:  Chest  radiograph dated 07/21/2023. FINDINGS: The heart is enlarged. Vascular calcifications are seen in the aortic arch. There has been interval placement of a right-sided pleural pigtail catheter with interval decreased size of a right pleural effusion with associated atelectasis. There remains trace right pleural effusion and mild right atelectasis. There are two areas of apparent kinking of the pleural catheter. A left pleural effusion with associated atelectasis/airspace disease appears unchanged. A vascular graft terminating in the right atrium is redemonstrated. IMPRESSION: 1. Interval placement of a right-sided pleural pigtail catheter with interval decreased size of a right pleural effusion. There are two areas of apparent kinking of the pleural catheter. 2. Unchanged left pleural effusion with associated atelectasis/airspace disease. Electronically Signed   By: Romona Curls M.D.   On: 07/22/2023 16:06    Medications:  sodium chloride     albumin human     anticoagulant sodium citrate     azithromycin 500 mg (07/24/23 0654)   cefTRIAXone (ROCEPHIN)  IV 2 g (07/24/23 0602)   vancomycin      apixaban  2.5 mg Oral BID   arformoterol  15 mcg Nebulization BID   atorvastatin  20 mg Oral q1800   clopidogrel  75 mg Oral q morning   feeding supplement  237 mL Oral BID BM   influenza vaccine adjuvanted  0.5 mL Intramuscular Tomorrow-1000   mirtazapine  30 mg Oral QHS   mycophenolate  180 mg Oral BID   pantoprazole  40 mg Oral Daily   pneumococcal 20-valent conjugate vaccine  0.5 mL Intramuscular Tomorrow-1000   revefenacin  175 mcg Nebulization Daily   senna  2 tablet Oral BID   sodium chloride flush  10 mL Intrapleural Q8H   sodium chloride flush  3 mL Intravenous Q12H   tacrolimus  6 mg Oral BID    Dialysis Orders:  [facility], MWF, 4 hours,  EDW 60 kg 2K 3ca,RUE HeRo Graft, Mircera 120 mcg on 9/18  Assessment/Plan: 1. Acute hypoxic/hypercapnic respiratory failure w/respiratory acidosis/pleural  effusion - chest tube placed on 9/28. tPA instilled by PCCm on 9/29. S/p HD on 9/27 w/2.9L removed. Breathing better, on RA yesterday and 1L O2 overnight. 2. B/l PNA - on  Vanc 3. ESRD - on HD MWF.  HD today per regular schedule.   4. Anemia of CKD- Hgb 10. ESA due Wednesday.  5. Secondary hyperparathyroidism - CCa ok. Phos in goal. Not on binders or VDRA. Monitor labs.  6. HTN/volume - Blood pressure in goal. On midodrine pre HD for hypotension.  Not close to EDW if weight correct - doubt accuracy of bed weights.  Does not appear volume overloaded. UF as tolerated.  7. Nutrition - On regular diet. Monitor labs.  8. Persistent Tachycardia - per PMD 9. Hx heart transplant at [facility] discussed w/TXP team  Virgina Norfolk, PA-C [facility] 07/24/2023,12:50 PM  LOS: 3 days

## 2023-07-24 NOTE — Progress Notes (Signed)
Mobility Specialist Progress Note:   07/24/23 1100  Oxygen Therapy  O2 Device Nasal Cannula  Mobility  Activity Stood at bedside;Transferred to/from [facility]  Level of Assistance Moderate assist, patient does 50-74%  Assistive Device Front wheel walker  Distance Ambulated (ft) 10 ft  Activity Response Tolerated fair  Mobility Referral Yes  $Mobility charge 1 Mobility  Mobility Specialist Start Time (ACUTE ONLY) 1123  Mobility Specialist Stop Time (ACUTE ONLY) 1137  Mobility Specialist Time Calculation (min) (ACUTE ONLY) 14 min    Pt received in bed, agreeable to mobility. Performed x3 STS, marching, and lateral steps. Pt expressed need to have BM. Transferred to [facility]. Pt left on [facility] with call bell near and family present. NT aware.  Rickey Erickson Mobility Specialist Please contact via Special educational needs teacher or Rehab office at [phone]

## 2023-07-24 NOTE — Plan of Care (Signed)

## 2023-07-24 NOTE — Progress Notes (Signed)
Mobility Specialist Progress Note:   07/24/23 1158  Mobility  Activity Transferred to/from [facility]  Level of Assistance Moderate assist, patient does 50-74%  Assistive Device Front wheel walker  Distance Ambulated (ft) 4 ft  Activity Response Tolerated fair  Mobility Referral Yes  $Mobility charge 1 Mobility  Mobility Specialist Start Time (ACUTE ONLY) 1141  Mobility Specialist Stop Time (ACUTE ONLY) 1151  Mobility Specialist Time Calculation (min) (ACUTE ONLY) 10 min    Pt received on [facility], ready to return to bed. Stated it feels like he's falling backwards when he stands. Assisted in peri care. Pt left in bed with call bell and bed alarm on. NT and family present.  Rickey Erickson Mobility Specialist Please contact via Special educational needs teacher or Rehab office at [phone]

## 2023-07-24 NOTE — Progress Notes (Signed)
   07/24/23 0814  Assess: MEWS Score  Temp 97.9 F (36.6 C)  BP 113/68  MAP (mmHg) 81  Pulse Rate (!) 110  ECG Heart Rate (!) 109  Resp (!) 24  SpO2 96 %  O2 Device Nasal Cannula  Assess: MEWS Score  MEWS Temp 0  MEWS Systolic 0  MEWS Pulse 1  MEWS RR 1  MEWS LOC 0  MEWS Score 2  MEWS Score Color Yellow  Assess: if the MEWS score is Yellow or Red  Were vital signs accurate and taken at a resting state? Yes  Does the patient meet 2 or more of the SIRS criteria? No  MEWS guidelines implemented  Yes, yellow  Treat  MEWS Interventions Considered administering scheduled or prn medications/treatments as ordered  Take Vital Signs  Increase Vital Sign Frequency  Yellow: Q2hr x1, continue Q4hrs until patient remains green for 12hrs  Escalate  MEWS: Escalate Yellow: Discuss with charge nurse and consider notifying provider and/or RRT  Notify: Charge Nurse/RN  Name of Charge Nurse/RN Notified Sarah, RN  Assess: SIRS CRITERIA  SIRS Temperature  0  SIRS Pulse 1  SIRS Respirations  1  SIRS WBC 0  SIRS Score Sum  2

## 2023-07-24 NOTE — Care Management Important Message (Signed)
Important Message  Patient Details  Name: Rickey Erickson MRN: [id number] Date of Birth: 1956-10-16   Important Message Given:  Yes - Medicare IM     [name] 07/24/2023, 12:36 PM

## 2023-07-24 NOTE — Progress Notes (Signed)
Pharmacy Antibiotic Note  Rickey Erickson is a 67 y.o. male admitted on 07/21/2023 with pneumonia in the setting of heart transplant on immunosuppressant therapy and ESRD on HD.  Pharmacy has been consulted for Vancomycin dosing.  WBC 5.3, afebrile Patient received Ceftriaxone 2g IV x1, Azithromycin 500mg  IV x1, Cefepime 2g IV x1, and Vancomycin 1250mg  IV x1 in ED.  Plan: Continue Ceftriaxone 2g IV q24h and Azithromycin 500mg  IV q24h per MD Continue vancomycin 750mg  IV qMWF HD starting 9/30 F/u HD schedule and adjust vancomycin regimen as needed Monitor daily CBC, temp, and for clinical signs of improvement  F/u cultures and de-escalate antibiotics as able   Weight: 66.5 kg (146 lb 9.7 oz)  Temp (24hrs), Avg:97.8 F (36.6 C), Min:97.5 F (36.4 C), Max:97.9 F (36.6 C)  Recent Labs  Lab 07/21/23 0445 07/21/23 0504 07/21/23 0631 07/21/23 1246 07/24/23 0503  WBC 7.2  --   --  5.2 5.3  CREATININE 6.73*  --   --  6.83* 8.27*  LATICACIDVEN  --  1.0 1.0  --   --     Estimated Creatinine Clearance: 8.3 mL/min (A) (by C-G formula based on SCr of 8.27 mg/dL (H)).    Allergies  Allergen Reactions   Transderm-Scop [Scopolamine] Other (See Comments)    Hallucinations Induces severe delirium    Nsaids Other (See Comments)    Kidney impact   Ativan [Lorazepam] Other (See Comments)    Hallucinations Severe somnolence/delirium     Antimicrobials this admission: Azithro 9/27 >>  CTX 9/27 >>  Cefepime 9/27 x1 Vancomycin 9/27 >>   Dose adjustments this admission: N/A  Microbiology results: 9/27 Bld Cx x2: ngtd 9/27 MRSA PCR: not dectected  Thank you for allowing pharmacy to be a part of this patient's care.  Reece Leader, Colon Flattery, BCCP Clinical Pharmacist  07/24/2023 1:56 PM   [facility] pharmacy phone numbers are listed on [email or web address]

## 2023-07-25 ENCOUNTER — Ambulatory Visit (HOSPITAL_COMMUNITY): Payer: Medicare Other

## 2023-07-25 ENCOUNTER — Inpatient Hospital Stay (HOSPITAL_COMMUNITY): Payer: Medicare Other

## 2023-07-25 DIAGNOSIS — J90 Pleural effusion, not elsewhere classified: Secondary | ICD-10-CM | POA: Diagnosis not present

## 2023-07-25 DIAGNOSIS — J9602 Acute respiratory failure with hypercapnia: Secondary | ICD-10-CM | POA: Diagnosis not present

## 2023-07-25 DIAGNOSIS — K21 Gastroesophageal reflux disease with esophagitis without hemorrhage: Principal | ICD-10-CM

## 2023-07-25 LAB — RENAL FUNCTION PANEL
Albumin: 3 g/dL — ABNORMAL LOW (ref 3.5–5.0)
Anion gap: 13 (ref 5–15)
BUN: 29 mg/dL — ABNORMAL HIGH (ref 8–23)
CO2: 26 mmol/L (ref 22–32)
Calcium: 7.4 mg/dL — ABNORMAL LOW (ref 8.9–10.3)
Chloride: 95 mmol/L — ABNORMAL LOW (ref 98–111)
Creatinine, Ser: 4.99 mg/dL — ABNORMAL HIGH (ref 0.61–1.24)
GFR, Estimated: 12 mL/min — ABNORMAL LOW (ref >60)
Glucose, Bld: 87 mg/dL (ref 70–99)
Phosphorus: 5.6 mg/dL — ABNORMAL HIGH (ref 2.5–4.6)
Potassium: 4.2 mmol/L (ref 3.5–5.1)
Sodium: 134 mmol/L — ABNORMAL LOW (ref 135–145)

## 2023-07-25 LAB — CBC
HCT: 33.7 % — ABNORMAL LOW (ref 39.0–52.0)
Hemoglobin: 9.9 g/dL — ABNORMAL LOW (ref 13.0–17.0)
MCH: 29 pg (ref 26.0–34.0)
MCHC: 29.4 g/dL — ABNORMAL LOW (ref 30.0–36.0)
MCV: 98.8 fL (ref 80.0–100.0)
Platelets: 117 10*3/uL — ABNORMAL LOW (ref 150–400)
RBC: 3.41 MIL/uL — ABNORMAL LOW (ref 4.22–5.81)
RDW: 15 % (ref 11.5–15.5)
WBC: 4.1 10*3/uL (ref 4.0–10.5)
nRBC: 0 % (ref 0.0–0.2)

## 2023-07-25 LAB — VITAMIN B12: Vitamin B-12: 823 pg/mL (ref 180–914)

## 2023-07-25 LAB — FOLATE: Folate: 6.8 ng/mL (ref >5.9)

## 2023-07-25 LAB — TSH: TSH: 1.829 u[IU]/mL (ref 0.350–4.500)

## 2023-07-25 MED ORDER — STERILE WATER FOR INJECTION IJ SOLN
5.0000 mg | Freq: Once | RESPIRATORY_TRACT | Status: AC
  Administered 2023-07-25: 5 mg via INTRAPLEURAL
  Filled 2023-07-25: qty 5

## 2023-07-25 MED ORDER — SODIUM CHLORIDE (PF) 0.9 % IJ SOLN
10.0000 mg | Freq: Once | INTRAMUSCULAR | Status: AC
  Administered 2023-07-25: 10 mg via INTRAPLEURAL
  Filled 2023-07-25: qty 10

## 2023-07-25 MED ORDER — QUETIAPINE 12.5 MG HALF TABLET
12.2500 mg | ORAL_TABLET | Freq: Every day | ORAL | Status: DC
  Administered 2023-07-25: 12.5 mg via ORAL
  Filled 2023-07-25: qty 1

## 2023-07-25 MED ORDER — CHLORHEXIDINE GLUCONATE CLOTH 2 % EX PADS
6.0000 | MEDICATED_PAD | Freq: Every day | CUTANEOUS | Status: DC
  Administered 2023-07-26 – 2023-07-27 (×2): 6 via TOPICAL

## 2023-07-25 MED ORDER — MIRTAZAPINE 30 MG TABLET
ORAL_TABLET | Freq: Every evening | ORAL | 3 refills | 0 days
Start: 2023-07-25 — End: ?

## 2023-07-25 MED ORDER — ATORVASTATIN 20 MG TABLET
ORAL_TABLET | Freq: Every day | ORAL | 3 refills | 0 days
Start: 2023-07-25 — End: ?

## 2023-07-25 MED ORDER — PANTOPRAZOLE 40 MG TABLET,DELAYED RELEASE
ORAL_TABLET | Freq: Every day | ORAL | 3 refills | 0 days
Start: 2023-07-25 — End: ?

## 2023-07-25 NOTE — Progress Notes (Signed)
PHARMACY - PHYSICIAN COMMUNICATION CRITICAL VALUE ALERT - BLOOD CULTURE IDENTIFICATION (BCID)  Rickey Erickson is an 67 y.o. male who presented to [facility] on 07/21/2023 with a chief complaint of pneumonia, respiratory failure.  Assessment:  1 set blood cultures drawn - anaerobic bottle growing GPR, no BCID done   Name of physician (or Provider) Contacted: Dr. Loney Loh  Current antibiotics: ceftriaxone, azithromycin, vancomycin  Changes to prescribed antibiotics recommended:  Patient is on recommended antibiotics - No changes needed  No results found for this or any previous visit.  Loralee Pacas, PharmD, BCPS 07/25/2023  8:57 PM

## 2023-07-25 NOTE — Progress Notes (Signed)
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  In the ER he was found to be in acute hypercarbic respiratory failure and required initiation of BiPAP.  CXR noted a significant right pleural effusion.  He had most recently undergone a thoracentesis at [facility] on 9/19 with 700 cc of fluid produced.  Significant Events:  9/27 admit via PCCM  9/28 pigtail chest tube placed by PCCM  9/29 care assumed by [facility]  Goals of Care:   Code Status: Full Code   DVT prophylaxis: apixaban (ELIQUIS) tablet 2.5 mg Start: 07/23/23 1000 apixaban (ELIQUIS) tablet 2.5 mg   Interim Hx: No acute events recorded overnight.  Afebrile.  Sinus tachycardia persists.  Other vital stable.  The patient remains mildly confused, an issue that recurred starting yesterday.  He denies focal weakness and moves all 4 extremities spontaneously.  There are no obvious cranial nerve deficits.  He is able to answer most questions appropriately.  He is not agitated.  Assessment & Plan:  Acute hypoxic and hypercarbic respiratory failure - resolved Required BiPAP at initial presentation with symptoms of severe dyspnea -now successfully weaned to minimal O2 support at 1-2 L nasal cannula  Bilateral lower lobe pneumonia with septic shock Required vasopressor support with Levophed during initial hospital stay-blood pressure now stable without support -afebrile -WBC normalized -tolerating empiric antibiotic without difficulty  Complex right pleural effusion with septations Has a history of requiring multiple prior thoracentesis procedures -status post placement of pigtail chest tube 9/28 by critical care  -ongoing tube and effusion management per critical care -pleural fluid studies consistent with exudate per Light's criteria -culture negative thus far -tPA instilled in tube 9/29 and again 10/1  Acute metabolic encephalopathy due to hypercarbia -recurrent confusion Initial encephalopathy felt to be due to respiratory failure with hypercarbia and resolved with clinical improvement and utilization of BiPAP -recurrent confusion likely simply low-grade sundowning -no focal neurologic deficits to suggest CVA - B12 and folic acid levels not low - will check CT head given use of DOAC to assure there is no ICH   Persisting sinus tachycardia Has been consistent pretty much since admission -is not on chronic beta-blocker therapy as patient has difficulty with hypotension during dialysis requiring midodrine -cortisol normal during this hospital stay - TSH normal -is on chronic Eliquis therapy making PE much less likely - monitor   Status post heart transplant Continue usual immunosuppressive therapy with Prograf - PCCM has been in touch with the transplant team at [facility]  ESRD on HD MWF Nephrology attending to ongoing dialysis  Anemia of chronic kidney disease Erythropoietin per Nephrology team  Family Communication: No family present at time of exam today Disposition: Anticipate eventual discharge home   Objective: Blood pressure (!) 110/58, pulse (!) 107, temperature 97.7 F (36.5 C), temperature source Axillary, resp. rate 16, weight 65.6 kg, SpO2 100%.  Intake/Output Summary (Last 24 hours) at 07/25/2023 0957 Last data filed at 07/25/2023 6295 Gross per 24 hour  Intake 855.52 ml  Output 1840 ml  Net -984.48 ml   Filed Weights   07/24/23 1451 07/24/23 1902 07/25/23 0409  Weight: 68.2 kg 66 kg 65.6 kg    Examination: General: No acute respiratory  distress -alert but mildly confused Lungs: Clear to auscultation bilaterally without wheezing Cardiovascular: Cardiac but regular without  murmur Abdomen: NT/ND, soft, BS positive Extremities: No significant edema B LE  CBC: Recent Labs  Lab 07/21/23 0445 07/21/23 0503 07/21/23 1246 07/21/23 1302 07/24/23 0503 07/25/23 0454  WBC 7.2  --  5.2  --  5.3 4.1  NEUTROABS 5.3  --   --   --   --   --   HGB 12.7*   < > 10.6* 12.6* 10.0* 9.9*  HCT 45.3   < > 36.7* 37.0* 34.1* 33.7*  MCV 100.2*  --  101.4*  --  95.3 98.8  PLT 194  --  153  --  117* 117*   < > = values in this interval not displayed.   Basic Metabolic Panel: Recent Labs  Lab 07/21/23 0919 07/21/23 1246 07/21/23 1302 07/24/23 0503 07/25/23 0454  NA  --  137 137 133* 134*  K  --  5.1 4.3 4.4 4.2  CL  --  105  --  95* 95*  CO2  --  21*  --  21* 26  GLUCOSE  --  68*  --  100* 87  BUN  --  44*  --  75* 29*  CREATININE  --  6.83*  --  8.27* 4.99*  CALCIUM  --  7.7*  --  7.6* 7.4*  MG 2.0  --   --  2.1  --   PHOS 5.6* 5.2*  --   --  5.6*   GFR: Estimated Creatinine Clearance: 13.5 mL/min (A) (by C-G formula based on SCr of 4.99 mg/dL (H)).   Scheduled Meds:  apixaban  2.5 mg Oral BID   arformoterol  15 mcg Nebulization BID   atorvastatin  20 mg Oral q1800   clopidogrel  75 mg Oral q morning   feeding supplement  237 mL Oral BID BM   influenza vaccine adjuvanted  0.5 mL Intramuscular Tomorrow-1000   mirtazapine  30 mg Oral QHS   mycophenolate  180 mg Oral BID   pantoprazole  40 mg Oral Daily   pneumococcal 20-valent conjugate vaccine  0.5 mL Intramuscular Tomorrow-1000   revefenacin  175 mcg Nebulization Daily   senna  2 tablet Oral BID   sodium chloride flush  10 mL Intrapleural Q8H   sodium chloride flush  3 mL Intravenous Q12H   tacrolimus  6 mg Oral BID   Continuous Infusions:  sodium chloride     albumin human     azithromycin 500 mg (07/25/23 0549)   cefTRIAXone (ROCEPHIN)  IV 2 g (07/25/23 0516)   vancomycin Stopped (07/24/23 2106)     LOS: 4 days   Lonia Blood, MD [facility] Office  [phone] Pager - Text  Page per Loretha Stapler  If 7PM-7AM, please contact night-coverage per Amion 07/25/2023, 9:57 AM

## 2023-07-25 NOTE — Procedures (Signed)
Pleural Fibrinolytic Administration Procedure Note  Rickey Erickson  [id number]  12/04/1955  Date:07/25/23  Time:1:01 PM   Provider Performing:Takashi Korol E  Rickey Erickson   Procedure: Pleural Fibrinolysis Subsequent day (40981)  Indication(s) Fibrinolysis of complicated pleural effusion  Consent Risks of the procedure as well as the alternatives and risks of each were explained to the patient and/or caregiver.  Consent for the procedure was obtained.   Anesthesia None   Time Out Verified patient identification, verified procedure, site/side was marked, verified correct patient position, special equipment/implants available, medications/allergies/relevant history reviewed, required imaging and test results available.   Sterile Technique Hand hygiene, gloves   Procedure Description Existing pleural catheter was cleaned and accessed in sterile manner.  10mg  of tPA in 30cc of saline and 5mg  of dornase in 30cc of sterile water were injected into pleural space using existing pleural catheter.  Catheter will be clamped for 1 hour and then placed back to suction.   Complications/Tolerance None; patient tolerated the procedure well.  EBL None   Specimen(s) None   Mertha Baars, PA-C [facility] 07/25/2023, 1:01 PM  Please see [email or web address] for pager details.  From 7A-7P if no response, please call [phone]. After hours, please call ELink [phone].

## 2023-07-25 NOTE — Progress Notes (Signed)
[facility] Progress Note   Subjective:   Patient seen and examined at bedside.  Feeling better today. Slept well yesterday.  Reports feeling like he skipped Monday. Chest pain and breathing improved.  Denies abdominal pain  and n/v/d. Output from chest tube appears to have decreased. May be going to CIR.   Objective Vitals:   07/25/23 0400 07/25/23 0409 07/25/23 0744 07/25/23 1241  BP:  103/60 (!) 110/58 121/61  Pulse:  (!) 108 (!) 107 (!) 104  Resp:  15 16 16   Temp:  (!) 97.4 F (36.3 C) 97.7 F (36.5 C) 98.6 F (37 C)  TempSrc:  Oral Axillary Oral  SpO2: 100% 100% 100% 100%  Weight:  65.6 kg     Physical Exam General:chronically ill appearing male in NAD Heart:+tachycardia Lungs:clear anteriorly, chest tube in place, nml WOB on 2L O2 Abdomen:soft, NTND Extremities:no LE edema Dialysis Access: RU AVF +b/t   Filed Weights   07/24/23 1451 07/24/23 1902 07/25/23 0409  Weight: 68.2 kg 66 kg 65.6 kg    Intake/Output Summary (Last 24 hours) at 07/25/2023 1452 Last data filed at 07/25/2023 1030 Gross per 24 hour  Intake 1115.52 ml  Output 1840 ml  Net -724.48 ml    Additional Objective Labs: Basic Metabolic Panel: Recent Labs  Lab 07/21/23 0919 07/21/23 1246 07/21/23 1302 07/24/23 0503 07/25/23 0454  NA  --  137 137 133* 134*  K  --  5.1 4.3 4.4 4.2  CL  --  105  --  95* 95*  CO2  --  21*  --  21* 26  GLUCOSE  --  68*  --  100* 87  BUN  --  44*  --  75* 29*  CREATININE  --  6.83*  --  8.27* 4.99*  CALCIUM  --  7.7*  --  7.6* 7.4*  PHOS 5.6* 5.2*  --   --  5.6*   Liver Function Tests: Recent Labs  Lab 07/21/23 0445 07/21/23 1246 07/22/23 1559 07/24/23 0503 07/25/23 0454  AST 15  --   --  13*  --   ALT 11  --   --  12  --   ALKPHOS 63  --   --  50  --   BILITOT 0.2*  --   --  0.5  --   PROT 6.8  --  5.9* 5.7*  --   ALBUMIN 3.7 2.8*  --  3.1* 3.0*   Recent Labs  Lab 07/21/23 0919  LIPASE 43  AMYLASE 43   CBC: Recent Labs  Lab  07/21/23 0445 07/21/23 0503 07/21/23 1246 07/21/23 1302 07/24/23 0503 07/25/23 0454  WBC 7.2  --  5.2  --  5.3 4.1  NEUTROABS 5.3  --   --   --   --   --   HGB 12.7*   < > 10.6* 12.6* 10.0* 9.9*  HCT 45.3   < > 36.7* 37.0* 34.1* 33.7*  MCV 100.2*  --  101.4*  --  95.3 98.8  PLT 194  --  153  --  117* 117*   < > = values in this interval not displayed.   Blood Culture    Component Value Date/Time   SDES PLEURAL 07/22/2023 1612   SPECREQUEST NONE 07/22/2023 1612   CULT  07/22/2023 1612    NO GROWTH 3 DAYS Performed at [facility], [address], [place], [place] [address]    REPTSTATUS PENDING 07/22/2023 1612    CBG:  Recent Labs  Lab 07/21/23 1025 07/21/23 1926  GLUCAP 86 92    Studies/Results: DG Chest Port 1 View  Result Date: 07/25/2023 CLINICAL DATA:  Chest tube in place. EXAM: PORTABLE CHEST 1 VIEW COMPARISON:  July 24, 2023. FINDINGS: Stable cardiomediastinal silhouette. Right-sided chest tube is unchanged in position. No pneumothorax is noted. Small left pleural effusion is noted with associated atelectasis. Mildly increased right basilar atelectasis is noted. Sternotomy wires are noted. IMPRESSION: Stable right-sided chest tube without pneumothorax. Mildly increased right basilar atelectasis. Stable small left pleural effusion with associated atelectasis. Electronically Signed   By: Lupita Raider M.D.   On: 07/25/2023 13:22   DG CHEST PORT 1 VIEW  Result Date: 07/24/2023 CLINICAL DATA:  Status post chest tube placement. EXAM: PORTABLE CHEST 1 VIEW COMPARISON:  Radiograph 07/22/2023. FINDINGS: Right-sided dialysis catheter in place. Stable positioning of right basilar pigtail catheter. 1 of the areas of kinking is unchanged, 1 of the areas of kinking has slightly straightened from prior exam. No convincing pneumothorax. Small amount of pleural fluid may persist at the right lung base with adjacent streaky basilar opacity. Left basilar pleural effusion and  opacity is similar. No pulmonary edema. Stable heart size and mediastinal contours. IMPRESSION: 1. Stable positioning of right basilar pigtail catheter. 1 of the areas of kinking is unchanged, 1 of the areas of kinking has slightly straightened from prior exam. No convincing pneumothorax. 2. Small amount of pleural fluid may persist at the right lung base with adjacent streaky basilar opacity. Left basilar pleural effusion and opacity is similar. Electronically Signed   By: Narda Rutherford M.D.   On: 07/24/2023 10:50    Medications:  sodium chloride     albumin human     azithromycin 500 mg (07/25/23 0549)   cefTRIAXone (ROCEPHIN)  IV 2 g (07/25/23 0516)   vancomycin Stopped (07/24/23 2106)    apixaban  2.5 mg Oral BID   arformoterol  15 mcg Nebulization BID   atorvastatin  20 mg Oral q1800   clopidogrel  75 mg Oral q morning   feeding supplement  237 mL Oral BID BM   influenza vaccine adjuvanted  0.5 mL Intramuscular Tomorrow-1000   mirtazapine  30 mg Oral QHS   mycophenolate  180 mg Oral BID   pantoprazole  40 mg Oral Daily   pneumococcal 20-valent conjugate vaccine  0.5 mL Intramuscular Tomorrow-1000   revefenacin  175 mcg Nebulization Daily   senna  2 tablet Oral BID   sodium chloride flush  10 mL Intrapleural Q8H   sodium chloride flush  3 mL Intravenous Q12H   tacrolimus  6 mg Oral BID    Dialysis Orders: [facility], MWF, 4 hours,  EDW 60 kg 2K 3ca,RUE HeRo Graft, Mircera 120 mcg on 9/18   Assessment/Plan: 1. Acute hypoxic/hypercapnic respiratory failure w/respiratory acidosis/pleural effusion - chest tube placed on 9/28. tPA instilled by PCCM on 9/29, and again today. S/p HD on 9/27 w/2.9L removed, and yesterday with 1.84L removed. Breathing better, on 2L O2. May go to CIR.  2. B/l PNA - on Vanc 3. ESRD - on HD MWF.  HD tomorrow per regular schedule.   4. Anemia of CKD- Hgb 10. ESA due Wednesday.  5. Secondary hyperparathyroidism - CCa ok. Phos in goal. Not on binders or VDRA.  Monitor labs.  6. HTN/volume - Blood pressure in goal. On midodrine pre HD for hypotension.  Not close to EDW if weight correct - doubt accuracy of bed weights.  Does  not appear volume overloaded. UF as tolerated.  7. Nutrition - On regular diet. Monitor labs.  8. Persistent Tachycardia - per PMD 9. Hx heart transplant at [facility] discussed w/TXP team  Virgina Norfolk, PA-C [facility] 07/25/2023,2:52 PM  LOS: 4 days

## 2023-07-25 NOTE — Progress Notes (Signed)
Inpatient Rehab Admissions Coordinator:  ? ?Per therapy recommendations,  patient was screened for CIR candidacy by Devaney Segers, MS, CCC-SLP. At this time, Pt. Appears to be a a potential candidate for CIR. I will place   order for rehab consult per protocol for full assessment. Please contact me any with questions. ? ?Trine Fread, MS, CCC-SLP ?Rehab Admissions Coordinator  ?[phone] (celll) ?[phone] (office) ? ?

## 2023-07-25 NOTE — Progress Notes (Signed)
Physical Therapy Treatment Patient Details Name: Rickey Erickson MRN: [id number] DOB: [birth year] Today's Date: 07/25/2023   History of Present Illness 67 y/o male presents [facility] 07/21/23 with SOB and not feeling well, admitted with acute hypercarbic respiratory failure, B pneumonia, R pleural effusion. Chest tube placed 9/28, left ICU 9/29.  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  Fatigues quickly needing seated rest also had to increased O2 from 2L to at rest to 4L with activity.  He remains somewhat limited with activity tolerance and strength thus may benefit from inpatient rehab prior to d/c home with sister to assist.  Could potentially benefit from intensive therapies and shorter length of stay for rehab as he has 24 hour assist available.  PT will continue to follow.      If plan is discharge home, recommend the following: A little help with walking and/or transfers;Help with stairs or ramp for entrance;Assistance with cooking/housework;A little help with bathing/dressing/bathroom   Can travel by private vehicle     Yes  Equipment Recommendations  None recommended by PT    Recommendations for Other Services Rehab consult     Precautions / Restrictions Precautions Precautions: Fall Precaution Comments: O2 dependent, chest tube     Mobility  Bed Mobility Overal bed mobility: Needs Assistance Bed Mobility: Supine to Sit, Sit to Supine     Supine to sit: Min assist Sit to supine: Supervision   General bed mobility comments: hand hold assist to lift trunk, increased time to scoot hips, assist with cues for positioning to supine    Transfers Overall transfer level: Needs assistance Equipment used: Rolling walker (2 wheels) Transfers: Sit to/from Stand Sit to Stand: Contact guard assist           General transfer comment: heavy UE  reliance sit to stand CGA for balance    Ambulation/Gait Ambulation/Gait assistance: Min assist Gait Distance (Feet): 22 Feet (x 2) Assistive device: Rolling walker (2 wheels) Gait Pattern/deviations: Step-through pattern, Shuffle, Trunk flexed       General Gait Details: initial difficulty progressing RW but able to with occasional assist after initial help, in hallway pt c/o unable to return to room so provided desk chair to rest and replaced SpO2 probe noted still in 80's so increased O2 to 4LPM for return to room   Stairs             Wheelchair Mobility     Tilt Bed    Modified Rankin (Stroke Patients Only)       Balance Overall balance assessment: Needs assistance Sitting-balance support: Feet supported Sitting balance-Leahy Scale: Fair Sitting balance - Comments: flexed posture in sitting with poor postural support; though able to sit with S   Standing balance support: Bilateral upper extremity supported, Reliant on assistive device for balance Standing balance-Leahy Scale: Poor                              Cognition Arousal: Alert Behavior During Therapy: WFL for tasks assessed/performed Overall Cognitive Status: Within Functional Limits for tasks assessed                                          Exercises Other Exercises Other Exercises: seated with  back support provided working on tall posture with chest expansion and deep inspiration x several reps with pt on EOB    General Comments General comments (skin integrity, edema, etc.): 2L O2 at rest SpO2 98% with ambulation noted poor pleth and reading into 70's, increased to 3LPM then placed new SpO2 probe and still in 80's so placed on 4LPM with remainder of ambulation; sister present and supportive      Pertinent Vitals/Pain Pain Assessment Pain Assessment: Faces Faces Pain Scale: Hurts little more Pain Location: back, chest tube site Pain Descriptors / Indicators:  Sore Pain Intervention(s): Monitored during session, Repositioned    Home Living                          Prior Function            PT Goals (current goals can now be found in the care plan section) Progress towards PT goals: Progressing toward goals    Frequency    Min 1X/week      PT Plan      Co-evaluation              AM-PAC PT "6 Clicks" Mobility   Outcome Measure  Help needed turning from your back to your side while in a flat bed without using bedrails?: A Little Help needed moving from lying on your back to sitting on the side of a flat bed without using bedrails?: A Little Help needed moving to and from a bed to a chair (including a wheelchair)?: A Little Help needed standing up from a chair using your arms (e.g., wheelchair or bedside chair)?: A Little Help needed to walk in hospital room?: A Little Help needed climbing 3-5 steps with a railing? : Total 6 Click Score: 16    End of Session Equipment Utilized During Treatment: Gait belt;Oxygen Activity Tolerance: Patient tolerated treatment well Patient left: in bed;with call bell/phone within reach;with family/visitor present   PT Visit Diagnosis: Other abnormalities of gait and mobility (R26.89);Muscle weakness (generalized) (M62.81)     Time: 1610-9604 PT Time Calculation (min) (ACUTE ONLY): 31 min  Charges:    $Gait Training: 8-22 mins $Therapeutic Activity: 8-22 mins PT General Charges $$ ACUTE PT VISIT: 1 Visit                     Rickey Erickson, PT Acute Rehabilitation Services Office:[phone] 07/25/2023    Rickey Erickson 07/25/2023, 4:50 PM

## 2023-07-25 NOTE — Evaluation (Signed)
Occupational Therapy Evaluation Patient Details Name: Rickey Erickson MRN: [id number] DOB: 04-Oct-1956 Today's Date: 07/25/2023   History of Present Illness 67 y/o male presents [facility] 07/21/23 with SOB and not feeling well, admitted with acute hypercarbic respiratory failure, B pneumonia, R pleural effusion. Chest tube placed 9/28, left ICU 9/29.  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 Pt currently requires contact guard to minimal assistance for bed mobility, LB dressing and functional mobility at RW level. He is currently functioning below baseline secondary to decreased activity tolerance, poor endurance, instability and decreased cardiopulmonary function. Pt on 2lnc upon arrival, requiring 4lnc with exertion to maintain SpO2 >88%. Patient will benefit from continued inpatient follow up therapy, <3 hours/day. Will continue to follow acutely.        If plan is discharge home, recommend the following: A little help with walking and/or transfers;A little help with bathing/dressing/bathroom    Functional Status Assessment  Patient has had a recent decline in their functional status and demonstrates the ability to make significant improvements in function in a reasonable and predictable amount of time.  Equipment Recommendations  None recommended by OT    Recommendations for Other Services       Precautions / Restrictions Precautions Precautions: Fall Restrictions Weight Bearing Restrictions: No Other Position/Activity Restrictions: chest tube      Mobility Bed Mobility Overal bed mobility: Needs Assistance Bed Mobility: Supine to Sit     Supine to sit: Min assist     General bed mobility comments: increased time and effort, assist for trunk progression    Transfers Overall transfer level: Needs assistance Equipment  used: Rolling walker (2 wheels) Transfers: Sit to/from Stand Sit to Stand: Min assist           General transfer comment: inreased time, minimal assist for powerup      Balance Overall balance assessment: Needs assistance, Mild deficits observed, not formally tested Sitting-balance support: Feet supported, Bilateral upper extremity supported Sitting balance-Leahy Scale: Poor   Postural control: Posterior lean Standing balance support: Bilateral upper extremity supported, During functional activity, Reliant on assistive device for balance Standing balance-Leahy Scale: Poor Standing balance comment: posterior lean with initial stand, requiring minA to correct                           ADL either performed or assessed with clinical judgement   ADL Overall ADL's : Needs assistance/impaired Eating/Feeding: Independent   Grooming: Set up;Sitting   Upper Body Bathing: Set up;Sitting   Lower Body Bathing: Contact guard assist;Sit to/from stand   Upper Body Dressing : Set up;Sitting   Lower Body Dressing: Minimal assistance;Sit to/from stand Lower Body Dressing Details (indicate cue type and reason): pt reports he has AE but does not use it Toilet Transfer: Minimal assistance   Toileting- Clothing Manipulation and Hygiene: Minimal assistance       Functional mobility during ADLs: Minimal assistance;Rolling walker (2 wheels) General ADL Comments: pt limited by decreased activity tolerance, generalized weakness, genderalized deconditioning     Vision         Perception         Praxis         Pertinent Vitals/Pain Pain Assessment Pain Assessment: No/denies pain     Extremity/Trunk Assessment Upper Extremity Assessment Upper Extremity Assessment: Generalized weakness   Lower  Extremity Assessment Lower Extremity Assessment: Generalized weakness   Cervical / Trunk Assessment Cervical / Trunk Assessment: Kyphotic   Communication  Communication Communication: Hearing impairment   Cognition Arousal: Alert Behavior During Therapy: WFL for tasks assessed/performed Overall Cognitive Status: Within Functional Limits for tasks assessed                                       General Comments  2lnc at rest, SpO2 84% 2lnc with sitting EOB, requiring 4lnc with exertion to maintain >88% with cues for pursed lip breathing    Exercises     Shoulder Instructions      Home Living Family/patient expects to be discharged to:: Private residence Living Arrangements: Other relatives (sister) Available Help at Discharge: Family;Available 24 hours/day Type of Home: House Home Access: Stairs to enter [employer] of Steps: 4 Entrance Stairs-Rails: Right Home Layout: Two level;Able to live on main level with bedroom/bathroom     Bathroom Shower/Tub: Tub/shower unit   Bathroom Toilet: Handicapped height Bathroom Accessibility: Yes   Home Equipment: Tub bench;BSC/3in1;Rolling Walker (2 wheels);Rollator (4 wheels);Cane - single point;Grab bars - tub/shower;Wheelchair - manual          Prior Functioning/Environment Prior Level of Function : Independent/Modified Independent             Mobility Comments: uses cane outside, no AD in the house ADLs Comments: assistance with cooking        OT Problem List: Decreased activity tolerance;Impaired balance (sitting and/or standing);Decreased safety awareness;Cardiopulmonary status limiting activity      OT Treatment/Interventions: Self-care/ADL training;Therapeutic exercise;Energy conservation;DME and/or AE instruction;Therapeutic activities;Balance training;Patient/family education    OT Goals(Current goals can be found in the care plan section) Acute Rehab OT Goals Patient Stated Goal: to get stronger and return home OT Goal Formulation: With patient Time For Goal Achievement: 08/08/23 Potential to Achieve Goals: Good ADL Goals Pt Will  Perform Lower Body Dressing: with modified independence;sit to/from stand Pt Will Transfer to Toilet: with modified independence;ambulating Additional ADL Goal #1: Pt will demonstrate use of 3 energy conservation strategies during ADL/IADL.  OT Frequency: Min 2X/week    Co-evaluation              AM-PAC OT "6 Clicks" Daily Activity     Outcome Measure Help from another person eating meals?: None Help from another person taking care of personal grooming?: A Little Help from another person toileting, which includes using toliet, bedpan, or urinal?: A Little Help from another person bathing (including washing, rinsing, drying)?: A Little Help from another person to put on and taking off regular upper body clothing?: A Little Help from another person to put on and taking off regular lower body clothing?: A Little 6 Click Score: 19   End of Session Equipment Utilized During Treatment: Rolling walker (2 wheels);Oxygen Nurse Communication: Mobility status  Activity Tolerance: Patient tolerated treatment well Patient left: in bed;with call bell/phone within reach;with bed alarm set;with family/visitor present  OT Visit Diagnosis: Other abnormalities of gait and mobility (R26.89);Unsteadiness on feet (R26.81);Muscle weakness (generalized) (M62.81)                Time: 1610-9604 OT Time Calculation (min): 25 min Charges:  OT General Charges $OT Visit: 1 Visit OT Evaluation $OT Eval Moderate Complexity: 1 Mod OT Treatments $Self Care/Home Management : 8-22 mins  Rosey Bath OTR/L Acute Rehabilitation Services Office: [phone]   Rosey Bath  Tiajuana Amass 07/25/2023, 12:43 PM

## 2023-07-25 NOTE — Progress Notes (Signed)
Pt receives out-pt HD at [facility] on MWF 10:20 am chair time. Info provided to CSW for d/c planning purposes. Will assist as needed.   Olivia Canter Renal Navigator [phone]

## 2023-07-25 NOTE — TOC Initial Note (Signed)
Transition of Care [facility]) - Initial/Assessment Note    Patient Details  Name: Rickey Erickson MRN: [id number] Date of Birth: 09-29-1956  Transition of Care [facility]) CM/SW Contact:    Delilah Shan, LCSWA Phone Number: 07/25/2023, 10:57 AM  Clinical Narrative:                  CSW received consult for possible SNF placement at time of discharge. CSW spoke with patient and patients sister Clydie Braun regarding PT recommendation of SNF placement at time of discharge. Patient reports he comes from home with sister Clydie Braun.Patient expressed understanding of PT recommendation and is agreeable for CSW to fax out for SNF placement at time of discharge. Patient wants to see what SNF bed offers he receives before deciding if he wants to go to SNF or return home with home health services. Patients sister confirms she can provide 24/7 supervision at home if patient decides to return home when medically ready. Patient also request to see if CIR can evaluate too for possible CIR placement. CSW reached out to Vernona Rieger with CIR to see if patient would be candidate for CIR. CSW will fax patient out for SNF and when bed offers are in will provide patient with medicare compare ratings list of accepted SNF bed offers to review. Patient request for CM to also speak with him about [facility] services . Patient wants to review all possible dc options before making final decision. CSW informed CM.CSW discussed insurance authorization process.Patient reports he receives HD on Mon,Wed,Friday. All questions answered. No further questions reported at this time. CSW to continue to follow and assist with discharge planning needs.   Expected Discharge Plan:  (TBD SNF Vrs. going home with [facility] services) Barriers to Discharge: Continued Medical Work up   Patient Goals and CMS Choice Patient states their goals for this hospitalization and ongoing recovery are:: TBD   Choice offered to / list presented to : Patient, Sibling (patient and sister)       Expected Discharge Plan and Services In-house Referral: Clinical Social Work     Living arrangements for the past 2 months: Single Family Home                                      Prior Living Arrangements/Services Living arrangements for the past 2 months: Single Family Home Lives with:: Siblings (Lives with Sister) Patient language and need for interpreter reviewed:: Yes Do you feel safe going back to the place where you live?:  (TBD)      Need for Family Participation in Patient Care: Yes (Comment) Care giver support system in place?: Yes (comment)   Criminal Activity/Legal Involvement Pertinent to Current Situation/Hospitalization: No - Comment as needed  Activities of Daily Living   ADL Screening (condition at time of admission) Does the patient have a NEW difficulty with bathing/dressing/toileting/self-feeding that is expected to last >3 days?: No Does the patient have a NEW difficulty with getting in/out of bed, walking, or climbing stairs that is expected to last >3 days?: No Does the patient have a NEW difficulty with communication that is expected to last >3 days?: No Is the patient deaf or have difficulty hearing?: No Does the patient have difficulty seeing, even when wearing glasses/contacts?: No Does the patient have difficulty concentrating, remembering, or making decisions?: No  Permission Sought/Granted Permission sought to share information with : Case Manager, Family Supports, Oceanographer  granted to share information with : Yes, Verbal Permission Granted  Share Information with NAME: Clydie Braun  Permission granted to share info w AGENCY: SNF  Permission granted to share info w Relationship: sister  Permission granted to share info w Contact Information: Clydie Braun [phone]  Emotional Assessment Appearance:: Appears stated age Attitude/Demeanor/Rapport: Gracious Affect (typically observed): Calm Orientation: : Oriented  to Self, Oriented to Place, Oriented to  Time, Oriented to Situation Alcohol / Substance Use: Not Applicable Psych Involvement: No (comment)  Admission diagnosis:  Respiratory failure (HCC) [J96.90] Acute respiratory failure with hypercapnia (HCC) [J96.02] Patient Active Problem List   Diagnosis Date Noted   Respiratory failure (HCC) 07/21/2023   Peripheral vascular disease (HCC) 10/04/2022   Snoring 03/29/2022   Fatigue 03/29/2022   Community acquired pneumonia of right lower lobe of lung    Pleural effusion    Immunocompromised patient (HCC) 06/19/2021   Sinus tachycardia 06/19/2021   Acute respiratory failure with hypoxia (HCC) 12/15/2020   MDD (major depressive disorder) 09/21/2020   HLD (hyperlipidemia) 09/20/2020   Anemia in chronic kidney disease 09/04/2020   ESRD (end stage renal disease) (HCC) 08/21/2020   Status post heart transplantation (HCC) 02/28/2020   Essential hypertension 04/10/2019   PCP:  Carin Hock, MD (Inactive) Pharmacy:   [id number] [place], [place] - [address] [place] [address] Phone: [phone] Fax: [phone]  [id number] - [place], [place] - [address] AT [facility] [address] [place] [address] Phone: [phone] Fax: [phone]  [facility] [id number] [place], [place] - [address] [place] [address] Phone: [phone] Fax: [phone]     Social Determinants of Health (SDOH) Social History: SDOH Screenings   Food Insecurity: No Food Insecurity (07/22/2023)  Housing: Low Risk  (07/22/2023)  Transportation Needs: No Transportation Needs (07/22/2023)  Utilities: Not At Risk (07/22/2023)  Depression (PHQ2-9): Low Risk  (04/24/2023)  Financial Resource Strain: Low Risk  (03/09/2023)   Received from [facility], [facility]  Social Connections: Unknown (05/08/2018)   Received from [facility],  [facility]  Stress: No Stress Concern Present (05/08/2018)   Received from [facility], [facility]  Tobacco Use: Low Risk  (07/22/2023)  Health Literacy: Medium Risk (01/28/2021)   Received from [facility], [facility]   SDOH Interventions:     Readmission Risk Interventions     No data to display

## 2023-07-25 NOTE — Plan of Care (Signed)

## 2023-07-25 NOTE — [place] FL2 (Addendum)
[place] MEDICAID FL2 LEVEL OF CARE FORM     IDENTIFICATION  Patient Name: Rickey Erickson Birthdate: Dec 15, 1955 Sex: male Admission Date (Current Location): 07/21/2023  [facility] and [place] Number:  Producer, television/film/video and Address:  [facility], [address] [place], [place] [address]      Provider Number: [id number]  Attending Physician Name and Address:  Lonia Blood, MD  Relative Name and Phone Number:  Clydie Braun (sister) [phone]    Current Level of Care: Hospital Recommended Level of Care: Skilled Nursing Facility Prior Approval Number:    Date Approved/Denied:   PASRR Number: [id number]  Discharge Plan: SNF    Current Diagnoses: Patient Active Problem List   Diagnosis Date Noted   Respiratory failure (HCC) 07/21/2023   Peripheral vascular disease (HCC) 10/04/2022   Snoring 03/29/2022   Fatigue 03/29/2022   Community acquired pneumonia of right lower lobe of lung    Pleural effusion    Immunocompromised patient (HCC) 06/19/2021   Sinus tachycardia 06/19/2021   Acute respiratory failure with hypoxia (HCC) 12/15/2020   MDD (major depressive disorder) 09/21/2020   HLD (hyperlipidemia) 09/20/2020   Anemia in chronic kidney disease 09/04/2020   ESRD (end stage renal disease) (HCC) 08/21/2020   Status post heart transplantation (HCC) 02/28/2020   Essential hypertension 04/10/2019    Orientation RESPIRATION BLADDER Height & Weight     Self, Time, Situation, Place  O2 (Nasal Cannula 2 liters) Continent Weight: 144 lb 10 oz (65.6 kg) Height:     BEHAVIORAL SYMPTOMS/MOOD NEUROLOGICAL BOWEL NUTRITION STATUS      Continent Diet (Please see discharge summary)  AMBULATORY STATUS COMMUNICATION OF NEEDS Skin   Limited Assist Verbally Other (Comment) (WDL,Wound/Incision LDAs)                       Personal Care Assistance Level of Assistance  Bathing, Feeding, Dressing           Functional Limitations Info  Sight,  Hearing, Speech Sight Info: Impaired Financial trader) Hearing Info: Adequate Speech Info: Adequate    SPECIAL CARE FACTORS FREQUENCY  PT (By licensed PT), OT (By licensed OT)     PT Frequency: 5x min weekly OT Frequency: 5x min weekly            Contractures Contractures Info: Not present    Additional Factors Info  Code Status, Allergies, Psychotropic Code Status Info: FULL Allergies Info: Transderm-scop (scopolamine),Nsaids,Ativan (lorazepam) Psychotropic Info: mirtazapine (REMERON) tablet 30 mg daily at bedtime         Current Medications (07/25/2023):  This is the current hospital active medication list Current Facility-Administered Medications  Medication Dose Route Frequency Provider Last Rate Last Admin   0.9 %  sodium chloride infusion  250 mL Intravenous PRN Minor, Vilinda Blanks, NP       acetaminophen (TYLENOL) tablet 650 mg  650 mg Oral Q6H PRN Lonia Blood, MD       albumin human 25 % solution 25 g  25 g Intravenous PRN Maxie Barb, MD       alteplase (CATHFLO ACTIVASE) 10 mg in sodium chloride (PF) 0.9 % 30 mL  10 mg Intrapleural Once Cristopher Peru, PA-C       And   dornase alfa (PULMOZYME) 5 mg in sterile water (preservative free) 30 mL  5 mg Intrapleural Once Cristopher Peru, PA-C       apixaban (ELIQUIS) tablet 2.5 mg  2.5 mg Oral BID  Lonia Blood, MD   2.5 mg at 07/25/23 6962   arformoterol (BROVANA) nebulizer solution 15 mcg  15 mcg Nebulization BID Martina Sinner, MD   15 mcg at 07/25/23 0836   atorvastatin (LIPITOR) tablet 20 mg  20 mg Oral q1800 Cheri Fowler, MD   20 mg at 07/24/23 1956   azithromycin (ZITHROMAX) 500 mg in sodium chloride 0.9 % 250 mL IVPB  500 mg Intravenous Q24H Martina Sinner, MD 250 mL/hr at 07/25/23 0549 500 mg at 07/25/23 0549   bisacodyl (DULCOLAX) EC tablet 5 mg  5 mg Oral Daily PRN Lonia Blood, MD   5 mg at 07/23/23 1408   cefTRIAXone (ROCEPHIN) 2 g in sodium chloride 0.9 % 100 mL IVPB  2 g  Intravenous Q24H Melody Comas B, MD 200 mL/hr at 07/25/23 0516 2 g at 07/25/23 0516   clopidogrel (PLAVIX) tablet 75 mg  75 mg Oral q morning Lonia Blood, MD   75 mg at 07/25/23 9528   docusate sodium (COLACE) capsule 100 mg  100 mg Oral BID PRN Minor, Vilinda Blanks, NP   100 mg at 07/23/23 2027   feeding supplement (ENSURE ENLIVE / ENSURE PLUS) liquid 237 mL  237 mL Oral BID BM Cheri Fowler, MD       fentaNYL (SUBLIMAZE) injection 50 mcg  50 mcg Intravenous Q6H PRN Cheri Fowler, MD       influenza vaccine adjuvanted (FLUAD) injection 0.5 mL  0.5 mL Intramuscular Tomorrow-1000 Cheri Fowler, MD       midodrine (PROAMATINE) tablet 10 mg  10 mg Oral PRN Maxie Barb, MD   10 mg at 07/24/23 1402   mirtazapine (REMERON) tablet 30 mg  30 mg Oral QHS Cheri Fowler, MD   30 mg at 07/24/23 2230   mycophenolate (MYFORTIC) EC tablet 180 mg  180 mg Oral BID Melody Comas B, MD   180 mg at 07/25/23 0954   ondansetron (ZOFRAN) injection 4 mg  4 mg Intravenous Q6H PRN Martina Sinner, MD       Oral care mouth rinse  15 mL Mouth Rinse PRN Martina Sinner, MD       pantoprazole (PROTONIX) EC tablet 40 mg  40 mg Oral Daily Minor, Vilinda Blanks, NP   40 mg at 07/25/23 4132   pneumococcal 20-valent conjugate vaccine (PREVNAR 20) injection 0.5 mL  0.5 mL Intramuscular Tomorrow-1000 Cheri Fowler, MD       polyethylene glycol (MIRALAX / GLYCOLAX) packet 17 g  17 g Oral Daily PRN Lonia Blood, MD   17 g at 07/23/23 2029   revefenacin (YUPELRI) nebulizer solution 175 mcg  175 mcg Nebulization Daily Martina Sinner, MD   175 mcg at 07/25/23 0836   senna (SENOKOT) tablet 17.2 mg  2 tablet Oral BID Cheri Fowler, MD   17.2 mg at 07/25/23 0952   sodium chloride flush (NS) 0.9 % injection 10 mL  10 mL Intrapleural Q8H Chand, Garnet Sierras, MD   10 mL at 07/25/23 1016   sodium chloride flush (NS) 0.9 % injection 3 mL  3 mL Intravenous Q12H Minor, Vilinda Blanks, NP   3 mL at 07/25/23 1007   sodium chloride  flush (NS) 0.9 % injection 3 mL  3 mL Intravenous PRN Minor, Vilinda Blanks, NP       tacrolimus (PROGRAF) capsule 6 mg  6 mg Oral BID Martina Sinner, MD   6 mg at 07/25/23 0954   vancomycin (VANCOREADY) IVPB  750 mg/150 mL  750 mg Intravenous Q M,W,F-HD Doristine Counter, [place]   Stopped at 07/24/23 2106     Discharge Medications: Please see discharge summary for a list of discharge medications.  Relevant Imaging Results:  Relevant Lab Results:   Additional Information [id number]  Delilah Shan, LCSWA  HD at [facility] on MWF 10:20 chair time.

## 2023-07-25 NOTE — Progress Notes (Addendum)
NAME:  Rickey Erickson, MRN:  [id number], DOB:  Feb 06, 1956, LOS: 4 ADMISSION DATE:  07/21/2023, CONSULTATION DATE: 07/21/2023 REFERRING MD: Emergency department, CHIEF COMPLAINT: Somnolence cardiac respiratory failure  History of Present Illness:  67 year old male status post heart transplant 2021 following a massive myocardial infarction.  Patient had multiple thoracentesis over the last 2 years and presents with 10 days of somnolence noted to be hypercarbic in the ER placed on BiPAP with improvement.  He gets dialysis Monday Wednesdays and Fridays he is due for treatment today.  He [facility] but we have been notified that they are not accepting patients due to lack of beds.  He was admitted and ultimately required chest tube insertion with output. Fluid studies consistent with exudative effusion likely from his current bilateral pneumonia. He was given pleural lytic therapy with good response.  Today, he has no complaints. On 1LNC. Does not feel short of breath. Denies fevers.   Pertinent  Medical History   Past Medical History:  Diagnosis Date   Anemia    Heart failure (HCC)    Hypertension    Osteoporosis    Renal disorder      Significant Hospital Events: Including procedures, antibiotic start and stop dates in addition to other pertinent events   9/28: pigtail chest tube placed  9/29: lytics given  10/1: repeated lytics given   Interim History / Subjective:  No real complaints today. Intermittent pain at the chest tube site which has not worsened. On 1LNC which he wears nightly at baseline  Objective   Blood pressure (!) 110/58, pulse (!) 107, temperature 97.7 F (36.5 C), temperature source Axillary, resp. rate 16, weight 65.6 kg, SpO2 100%.        Intake/Output Summary (Last 24 hours) at 07/25/2023 1107 Last data filed at 07/25/2023 1030 Gross per 24 hour  Intake 1115.52 ml  Output 1840 ml  Net -724.48 ml   Filed Weights    07/24/23 1451 07/24/23 1902 07/25/23 0409  Weight: 68.2 kg 66 kg 65.6 kg    Examination: Physical exam General: male sitting in bed, no acute distress  HEENT: [place]/AT, eyes anicteric.  moist mucus membranes Neuro: Alert, awake following commands Chest: decreased lung sound to RLL, without rhonchi or rales  Right-sided chest tube in place, draining yellowish fluid Heart: Regular rate and rhythm, no murmurs or gallops Abdomen: Soft, nontender, nondistended, bowel sounds present Skin: No rash Resolved Hospital Problem list     Assessment & Plan:  Acute hypoxic/hypercarbic respiratory failure  Bilateral lower lobe pneumonia Complex right-sided pleural effusion with septations status post multiple thoracentesis Pleural fluid studies are consistent with exudative effusion. S/p lytics on 9/29.  Oxygen requirement stable  Output 1300 pre lytics, put out 750 post-lytics. Yesterday with only 40mL out  - lytics this AM - CXR in AM. If minimal output consider removal tomorrow  - monitor CT output, continue to -20cmH20 - con't rocephin, azithro for CAP treatment; MRSA PCR negative, consider dc vancomycin  - pulmonary toileting   Rest of the management per primary team, PCCM will follow along for chest tube management  Best Practice (right click and "Reselect all SmartList Selections" daily)  Per primary team  Labs   CBC: Recent Labs  Lab 07/21/23 0445 07/21/23 0503 07/21/23 0705 07/21/23 1246 07/21/23 1302 07/24/23 0503 07/25/23 0454  WBC 7.2  --   --  5.2  --  5.3 4.1  NEUTROABS 5.3  --   --   --   --   --   --  HGB 12.7*   < > 12.9* 10.6* 12.6* 10.0* 9.9*  HCT 45.3   < > 38.0* 36.7* 37.0* 34.1* 33.7*  MCV 100.2*  --   --  101.4*  --  95.3 98.8  PLT 194  --   --  153  --  117* 117*   < > = values in this interval not displayed.    Basic Metabolic Panel: Recent Labs  Lab 07/21/23 0445 07/21/23 0503 07/21/23 0705 07/21/23 0919 07/21/23 1246 07/21/23 1302 07/24/23 0503  07/25/23 0454  NA 135   < > 131*  --  137 137 133* 134*  K 5.6*   < > 5.1  --  5.1 4.3 4.4 4.2  CL 98  --   --   --  105  --  95* 95*  CO2 22  --   --   --  21*  --  21* 26  GLUCOSE 142*  --   --   --  68*  --  100* 87  BUN 44*  --   --   --  44*  --  75* 29*  CREATININE 6.73*  --   --   --  6.83*  --  8.27* 4.99*  CALCIUM 8.3*  --   --   --  7.7*  --  7.6* 7.4*  MG  --   --   --  2.0  --   --  2.1  --   PHOS  --   --   --  5.6* 5.2*  --   --  5.6*   < > = values in this interval not displayed.   GFR: Estimated Creatinine Clearance: 13.5 mL/min (A) (by C-G formula based on SCr of 4.99 mg/dL (H)). Recent Labs  Lab 07/21/23 0445 07/21/23 0504 07/21/23 0631 07/21/23 0919 07/21/23 1246 07/24/23 0503 07/25/23 0454  PROCALCITON  --   --   --  0.31  --   --   --   WBC 7.2  --   --   --  5.2 5.3 4.1  LATICACIDVEN  --  1.0 1.0  --   --   --   --     Liver Function Tests: Recent Labs  Lab 07/21/23 0445 07/21/23 1246 07/22/23 1559 07/24/23 0503 07/25/23 0454  AST 15  --   --  13*  --   ALT 11  --   --  12  --   ALKPHOS 63  --   --  50  --   BILITOT 0.2*  --   --  0.5  --   PROT 6.8  --  5.9* 5.7*  --   ALBUMIN 3.7 2.8*  --  3.1* 3.0*   Recent Labs  Lab 07/21/23 0919  LIPASE 43  AMYLASE 43   Recent Labs  Lab 07/21/23 0622  AMMONIA 57*    ABG    Component Value Date/Time   PHART 7.227 (L) 07/21/2023 1302   PCO2ART 56.0 (H) 07/21/2023 1302   PO2ART 83 07/21/2023 1302   HCO3 23.3 07/21/2023 1302   TCO2 25 07/21/2023 1302   ACIDBASEDEF 5.0 (H) 07/21/2023 1302   O2SAT 93 07/21/2023 1302     Coagulation Profile: Recent Labs  Lab 07/21/23 0445  INR 1.0    Cardiac Enzymes: No results for input(s): "CKTOTAL", "CKMB", "CKMBINDEX", "TROPONINI" in the last 168 hours.  HbA1C: Hgb A1c MFr Bld  Date/Time Value Ref Range Status  01/06/2022 08:09 AM 4.9 4.8 - 5.6 %  Final    Comment:    (NOTE)         Prediabetes: 5.7 - 6.4         Diabetes: >6.4          Glycemic control for adults with diabetes: <7.0     CBG: Recent Labs  Lab 07/21/23 1025 07/21/23 1926  GLUCAP 86 92     Mertha Baars, PA-C

## 2023-07-26 ENCOUNTER — Inpatient Hospital Stay (HOSPITAL_COMMUNITY): Payer: Medicare Other

## 2023-07-26 DIAGNOSIS — J9601 Acute respiratory failure with hypoxia: Secondary | ICD-10-CM | POA: Diagnosis not present

## 2023-07-26 DIAGNOSIS — J90 Pleural effusion, not elsewhere classified: Secondary | ICD-10-CM | POA: Diagnosis not present

## 2023-07-26 DIAGNOSIS — G2571 Drug induced akathisia: Secondary | ICD-10-CM | POA: Diagnosis not present

## 2023-07-26 DIAGNOSIS — J9602 Acute respiratory failure with hypercapnia: Secondary | ICD-10-CM | POA: Diagnosis not present

## 2023-07-26 LAB — BLOOD GAS, VENOUS
Acid-base deficit: 4.7 mmol/L — ABNORMAL HIGH (ref 0.0–2.0)
Acid-base deficit: 6 mmol/L — ABNORMAL HIGH (ref 0.0–2.0)
Bicarbonate: 25 mmol/L (ref 20.0–28.0)
Bicarbonate: 23.9 mmol/L (ref 20.0–28.0)
Drawn by: 62344
Drawn by: 1470
O2 Saturation: 77.3 %
O2 Saturation: 98.2 %
Patient temperature: 36.9
Patient temperature: 36.6
pCO2, Ven: 67 mm[Hg] — ABNORMAL HIGH (ref 44–60)
pCO2, Ven: 66 mm[Hg] — ABNORMAL HIGH (ref 44–60)
pH, Ven: 7.18 — CL (ref 7.25–7.43)
pH, Ven: 7.17 — CL (ref 7.25–7.43)
pO2, Ven: 45 mm[Hg] (ref 32–45)
pO2, Ven: 105 mm[Hg] — ABNORMAL HIGH (ref 32–45)

## 2023-07-26 LAB — MRSA NEXT GEN BY PCR, NASAL: MRSA by PCR Next Gen: NOT DETECTED

## 2023-07-26 LAB — CBC
HCT: 32.3 % — ABNORMAL LOW (ref 39.0–52.0)
HCT: 28.9 % — ABNORMAL LOW (ref 39.0–52.0)
Hemoglobin: 9.3 g/dL — ABNORMAL LOW (ref 13.0–17.0)
Hemoglobin: 8.4 g/dL — ABNORMAL LOW (ref 13.0–17.0)
MCH: 28.1 pg (ref 26.0–34.0)
MCH: 29 pg (ref 26.0–34.0)
MCHC: 28.8 g/dL — ABNORMAL LOW (ref 30.0–36.0)
MCHC: 29.1 g/dL — ABNORMAL LOW (ref 30.0–36.0)
MCV: 97.6 fL (ref 80.0–100.0)
MCV: 99.7 fL (ref 80.0–100.0)
Platelets: 105 10*3/uL — ABNORMAL LOW (ref 150–400)
Platelets: 106 10*3/uL — ABNORMAL LOW (ref 150–400)
RBC: 3.31 MIL/uL — ABNORMAL LOW (ref 4.22–5.81)
RBC: 2.9 MIL/uL — ABNORMAL LOW (ref 4.22–5.81)
RDW: 14.6 % (ref 11.5–15.5)
RDW: 14.6 % (ref 11.5–15.5)
WBC: 3.9 10*3/uL — ABNORMAL LOW (ref 4.0–10.5)
WBC: 4.3 10*3/uL (ref 4.0–10.5)
nRBC: 0 % (ref 0.0–0.2)
nRBC: 0 % (ref 0.0–0.2)

## 2023-07-26 LAB — RENAL FUNCTION PANEL
Albumin: 2.8 g/dL — ABNORMAL LOW (ref 3.5–5.0)
Anion gap: 17 — ABNORMAL HIGH (ref 5–15)
BUN: 57 mg/dL — ABNORMAL HIGH (ref 8–23)
CO2: 21 mmol/L — ABNORMAL LOW (ref 22–32)
Calcium: 7 mg/dL — ABNORMAL LOW (ref 8.9–10.3)
Chloride: 94 mmol/L — ABNORMAL LOW (ref 98–111)
Creatinine, Ser: 8.16 mg/dL — ABNORMAL HIGH (ref 0.61–1.24)
GFR, Estimated: 7 mL/min — ABNORMAL LOW (ref >60)
Glucose, Bld: 80 mg/dL (ref 70–99)
Phosphorus: 8.7 mg/dL — ABNORMAL HIGH (ref 2.5–4.6)
Potassium: 4.5 mmol/L (ref 3.5–5.1)
Sodium: 132 mmol/L — ABNORMAL LOW (ref 135–145)

## 2023-07-26 LAB — BODY FLUID CULTURE W GRAM STAIN
Culture: NO GROWTH
Gram Stain: NONE SEEN

## 2023-07-26 LAB — HEMOGLOBIN AND HEMATOCRIT, BLOOD
HCT: 30.1 % — ABNORMAL LOW (ref 39.0–52.0)
Hemoglobin: 8.7 g/dL — ABNORMAL LOW (ref 13.0–17.0)

## 2023-07-26 MED ORDER — HEPARIN SODIUM (PORCINE) 1000 UNIT/ML DIALYSIS: 1000.0000 [IU] | INTRAMUSCULAR | Status: DC | PRN

## 2023-07-26 MED ORDER — LIDOCAINE HCL (PF) 1 % IJ SOLN: 5.0000 mL | INTRAMUSCULAR | Status: DC | PRN

## 2023-07-26 MED ORDER — ANTICOAGULANT SODIUM CITRATE 4% (200MG/5ML) IV SOLN: 5.0000 mL | Status: DC | PRN

## 2023-07-26 MED ORDER — FENTANYL CITRATE PF 50 MCG/ML IJ SOSY: 12.5000 ug | PREFILLED_SYRINGE | Freq: Four times a day (QID) | INTRAMUSCULAR | Status: DC | PRN

## 2023-07-26 MED ORDER — ALTEPLASE 2 MG IJ SOLR: 2.0000 mg | Freq: Once | INTRAMUSCULAR | Status: DC | PRN

## 2023-07-26 MED ORDER — SODIUM BICARBONATE 8.4 % IV SOLN
100.0000 meq | Freq: Once | INTRAVENOUS | Status: AC
  Administered 2023-07-26: 100 meq via INTRAVENOUS
  Filled 2023-07-26: qty 50

## 2023-07-26 MED ORDER — DARBEPOETIN ALFA 150 MCG/0.3ML IJ SOSY
150.0000 ug | PREFILLED_SYRINGE | INTRAMUSCULAR | Status: DC
  Administered 2023-07-26: 150 ug via SUBCUTANEOUS
  Filled 2023-07-26: qty 0.3

## 2023-07-26 MED ORDER — LIDOCAINE-PRILOCAINE 2.5-2.5 % EX CREA: 1.0000 | TOPICAL_CREAM | CUTANEOUS | Status: DC | PRN

## 2023-07-26 MED ORDER — DIPHENHYDRAMINE HCL 50 MG/ML IJ SOLN
50.0000 mg | Freq: Once | INTRAMUSCULAR | Status: AC
  Administered 2023-07-26: 50 mg via INTRAVENOUS
  Filled 2023-07-26: qty 1

## 2023-07-26 MED ORDER — PENTAFLUOROPROP-TETRAFLUOROETH EX AERO: 1.0000 | INHALATION_SPRAY | CUTANEOUS | Status: DC | PRN

## 2023-07-26 MED ORDER — PANTOPRAZOLE 40 MG TABLET,DELAYED RELEASE
ORAL_TABLET | Freq: Every day | ORAL | 3 refills | 90 days | Status: CP
Start: 2023-07-26 — End: ?

## 2023-07-26 MED ORDER — MIRTAZAPINE 30 MG TABLET
ORAL_TABLET | Freq: Every evening | ORAL | 3 refills | 90 days | Status: CP
Start: 2023-07-26 — End: ?

## 2023-07-26 MED ORDER — ATORVASTATIN 20 MG TABLET
ORAL | 3 refills | 90 days | Status: CP
Start: 2023-07-26 — End: ?

## 2023-07-26 NOTE — Progress Notes (Signed)
Patient removed from Bipap and placed on 2L via nasal cannula per Dr. Chestine Spore. Pt tol well at this time. NAD. Will cont to monitor

## 2023-07-26 NOTE — Plan of Care (Signed)

## 2023-07-26 NOTE — Progress Notes (Incomplete)
Informed by RN that patient's family has requested Seroquel to be discontinued.

## 2023-07-26 NOTE — Progress Notes (Signed)
Pulmonary Individual Treatment Plan  Patient Details  Name: Rickey Erickson MRN: [id number] Date of Birth: 01/14/1956 Referring Provider:   [name] Pulmonary Rehab Walk Test from 04/24/2023 in [facility]  Referring Provider [name]       Initial Encounter Date:  Flowsheet Row Pulmonary Rehab Walk Test from 04/24/2023 in [facility]  Date 04/24/23       Visit Diagnosis: Acute respiratory failure, unspecified whether with hypoxia or hypercapnia (HCC)  Patient's Home Medications on Admission:  No current facility-administered medications for this encounter. No current outpatient medications on file.  Facility-Administered Medications Ordered in Other Encounters:    0.9 %  sodium chloride infusion, 250 mL, Intravenous, PRN, Minor, Vilinda Blanks, NP   acetaminophen (TYLENOL) tablet 650 mg, 650 mg, Oral, Q6H PRN, Lonia Blood, MD   albumin human 25 % solution 25 g, 25 g, Intravenous, PRN, Maxie Barb, MD   apixaban Everlene Balls) tablet 2.5 mg, 2.5 mg, Oral, BID, Jetty Duhamel T, MD, 2.5 mg at 07/26/23 0814   arformoterol (BROVANA) nebulizer solution 15 mcg, 15 mcg, Nebulization, BID, Melody Comas B, MD, 15 mcg at 07/26/23 0737   atorvastatin (LIPITOR) tablet 20 mg, 20 mg, Oral, q1800, Cheri Fowler, MD, 20 mg at 07/25/23 1740   azithromycin (ZITHROMAX) 500 mg in sodium chloride 0.9 % 250 mL IVPB, 500 mg, Intravenous, Q24H, Melody Comas B, MD, Last Rate: 250 mL/hr at 07/26/23 0540, 500 mg at 07/26/23 0540   bisacodyl (DULCOLAX) EC tablet 5 mg, 5 mg, Oral, Daily PRN, Jetty Duhamel T, MD, 5 mg at 07/23/23 1408   cefTRIAXone (ROCEPHIN) 2 g in sodium chloride 0.9 % 100 mL IVPB, 2 g, Intravenous, Q24H, Melody Comas B, MD, Last Rate: 200 mL/hr at 07/26/23 0504, 2 g at 07/26/23 0504   Chlorhexidine Gluconate Cloth 2 % PADS 6 each, 6 each, Topical, Q0600,  Penninger, Lillia Abed, PA, 6 each at 07/26/23 0542   clopidogrel (PLAVIX) tablet 75 mg, 75 mg, Oral, q morning, Lonia Blood, MD, 75 mg at 07/26/23 4098   diphenhydrAMINE (BENADRYL) injection 50 mg, 50 mg, Intravenous, Once, Vann, Jessica U, DO   feeding supplement (ENSURE ENLIVE / ENSURE PLUS) liquid 237 mL, 237 mL, Oral, BID BM, Chand, Sudham, MD, 237 mL at 07/25/23 1740   fentaNYL (SUBLIMAZE) injection 50 mcg, 50 mcg, Intravenous, Q6H PRN, Merrily Pew, Sudham, MD   midodrine (PROAMATINE) tablet 10 mg, 10 mg, Oral, PRN, Maxie Barb, MD, 10 mg at 07/24/23 1402   mirtazapine (REMERON) tablet 30 mg, 30 mg, Oral, QHS, Chand, Sudham, MD, 30 mg at 07/25/23 2136   mycophenolate (MYFORTIC) EC tablet 180 mg, 180 mg, Oral, BID, Melody Comas B, MD, 180 mg at 07/26/23 0815   ondansetron (ZOFRAN) injection 4 mg, 4 mg, Intravenous, Q6H PRN, Martina Sinner, MD   Oral care mouth rinse, 15 mL, Mouth Rinse, PRN, Martina Sinner, MD   pantoprazole (PROTONIX) EC tablet 40 mg, 40 mg, Oral, Daily, Minor, Vilinda Blanks, NP, 40 mg at 07/26/23 0815   polyethylene glycol (MIRALAX / GLYCOLAX) packet 17 g, 17 g, Oral, Daily PRN, Lonia Blood, MD, 17 g at 07/23/23 2029   revefenacin (YUPELRI) nebulizer solution 175 mcg, 175 mcg, Nebulization, Daily, Martina Sinner, MD, 175 mcg at 07/26/23 0737   senna (SENOKOT) tablet 17.2 mg, 2 tablet, Oral, BID, Cheri Fowler, MD, 17.2 mg at 07/26/23 [id number]  sodium chloride flush (NS) 0.9 % injection 10 mL, 10 mL, Intrapleural, Q8H, Chand, Sudham, MD, 10 mL at 07/26/23 0816   sodium chloride flush (NS) 0.9 % injection 3 mL, 3 mL, Intravenous, Q12H, Minor, Vilinda Blanks, NP, 3 mL at 07/26/23 0816   sodium chloride flush (NS) 0.9 % injection 3 mL, 3 mL, Intravenous, PRN, Minor, Vilinda Blanks, NP   tacrolimus (PROGRAF) capsule 6 mg, 6 mg, Oral, BID, Melody Comas B, MD, 6 mg at 07/26/23 0815   vancomycin (VANCOREADY) IVPB 750 mg/150 mL, 750 mg, Intravenous, Q M,W,F-HD,  Doristine Counter, [place], Stopped at 07/24/23 2106  Past Medical History: Past Medical History:  Diagnosis Date   Anemia    Heart failure (HCC)    Hypertension    Osteoporosis    Renal disorder     Tobacco Use: Social History   Tobacco Use  Smoking Status Never  Smokeless Tobacco Never    Labs: Review Flowsheet       Latest Ref Rng & Units 01/06/2022 09/08/2022 03/07/2023 07/21/2023  Labs for ITP Cardiac and Pulmonary Rehab  Cholestrol 0 - 200 mg/dL 629  528  - -  LDL (calc) 0 - 99 mg/dL 90  80  - -  HDL-C >41 mg/dL 30  39  - -  Trlycerides <150 mg/dL 90  86  - -  Hemoglobin A1c 4.8 - 5.6 % 4.9  - - -  PH, Arterial 7.35 - 7.45 - - 7.497  7.227  7.109  7.060   PCO2 arterial 32 - 48 mmHg - - 33.6  56.0  85.7  107.3   Bicarbonate 20.0 - 28.0 mmol/L - - 26.0  23.3  27.6  25.0  27.4  30.5   TCO2 22 - 32 mmol/L - - 27  25  30  27  30   34   Acid-base deficit 0.0 - 2.0 mmol/L - - - 5.0  4.0  6.0  4.0  3.0   O2 Saturation % - - 96  93  30  75  86  92     Details       Multiple values from one day are sorted in reverse-chronological order         Capillary Blood Glucose: Lab Results  Component Value Date   GLUCAP 92 07/21/2023   GLUCAP 86 07/21/2023     Pulmonary Assessment Scores:  Pulmonary Assessment Scores     Row Name 04/24/23 0935         ADL UCSD   ADL Phase Entry     SOB Score total 50       CAT Score   CAT Score 16       mMRC Score   mMRC Score 3             UCSD: Self-administered rating of dyspnea associated with activities of daily living (ADLs) 6-point scale (0 = "not at all" to 5 = "maximal or unable to do because of breathlessness")  Scoring Scores range from 0 to 120.  Minimally important difference is 5 units  CAT: CAT can identify the health impairment of COPD patients and is better correlated with disease progression.  CAT has a scoring range of zero to 40. The CAT score is classified into four groups of low (less than 10), medium  (10 - 20), high (21-30) and very high (31-40) based on the impact level of disease on health status. A CAT score over 10 suggests significant symptoms.  A worsening  CAT score could be explained by an exacerbation, poor medication adherence, poor inhaler technique, or progression of COPD or comorbid conditions.  CAT MCID is 2 points  mMRC: mMRC (Modified Medical Research Council) Dyspnea Scale is used to assess the degree of baseline functional disability in patients of respiratory disease due to dyspnea. No minimal important difference is established. A decrease in score of 1 point or greater is considered a positive change.   Pulmonary Function Assessment:  Pulmonary Function Assessment - 04/24/23 0933       Breath   Bilateral Breath Sounds Decreased    Shortness of Breath Yes;Limiting activity             Exercise Target Goals: Exercise Program Goal: Individual exercise prescription set using results from initial 6 min walk test and THRR while considering  patient's activity barriers and safety.   Exercise Prescription Goal: Initial exercise prescription builds to 30-45 minutes a day of aerobic activity, 2-3 days per week.  Home exercise guidelines will be given to patient during program as part of exercise prescription that the participant will acknowledge.  Activity Barriers & Risk Stratification:  Activity Barriers & Cardiac Risk Stratification - 04/24/23 0936       Activity Barriers & Cardiac Risk Stratification   Activity Barriers Balance Concerns;Shortness of Breath;Muscular Weakness;Deconditioning   Rt arm herograft            6 Minute Walk:  6 Minute Walk     Row Name 04/24/23 1112         6 Minute Walk   Phase Initial     Distance 1015 feet     Walk Time 6 minutes     # of Rest Breaks 0     MPH 1.92     METS 3.36     RPE 11     Perceived Dyspnea  3     VO2 Peak 11.75     Symptoms No     Resting HR 110 bpm     Resting BP 120/62     Resting Oxygen  Saturation  98 %     Exercise Oxygen Saturation  during 6 min walk 95 %     Max Ex. HR 124 bpm     Max Ex. BP 132/64     2 Minute Post BP 122/64       Interval HR   1 Minute HR 114     2 Minute HR 118     3 Minute HR 121     4 Minute HR 123     5 Minute HR 123     6 Minute HR 124     Interval Heart Rate? Yes       Interval Oxygen   Interval Oxygen? Yes     Baseline Oxygen Saturation % 98 %     1 Minute Oxygen Saturation % 99 %     1 Minute Liters of Oxygen 2 L     2 Minute Oxygen Saturation % 97 %     2 Minute Liters of Oxygen 2 L     3 Minute Oxygen Saturation % 95 %     3 Minute Liters of Oxygen 2 L     4 Minute Oxygen Saturation % 97 %     4 Minute Liters of Oxygen 2 L     5 Minute Oxygen Saturation % 95 %     5 Minute Liters of Oxygen 2 L  6 Minute Oxygen Saturation % 96 %     6 Minute Liters of Oxygen 2 L     2 Minute Post Oxygen Saturation % 100 %     2 Minute Post Liters of Oxygen 2 L              Oxygen Initial Assessment:  Oxygen Initial Assessment - 04/24/23 0932       Home Oxygen   Home Oxygen Device E-Tanks;Home Concentrator    Sleep Oxygen Prescription Continuous    Liters per minute 1    Home Exercise Oxygen Prescription Continuous    Liters per minute 2    Home Resting Oxygen Prescription Continuous    Liters per minute 2    Compliance with Home Oxygen Use Yes      Initial 6 min Walk   Oxygen Used Continuous    Liters per minute 2      Program Oxygen Prescription   Program Oxygen Prescription Continuous    Liters per minute 2      Intervention   Short Term Goals To learn and exhibit compliance with exercise, home and travel O2 prescription;To learn and understand importance of maintaining oxygen saturations>88%;To learn and demonstrate proper use of respiratory medications;To learn and understand importance of monitoring SPO2 with pulse oximeter and demonstrate accurate use of the pulse oximeter.;To learn and demonstrate proper pursed  lip breathing techniques or other breathing techniques.     Long  Term Goals Exhibits compliance with exercise, home  and travel O2 prescription;Verbalizes importance of monitoring SPO2 with pulse oximeter and return demonstration;Exhibits proper breathing techniques, such as pursed lip breathing or other method taught during program session;Maintenance of O2 saturations>88%;Compliance with respiratory medication             Oxygen Re-Evaluation:  Oxygen Re-Evaluation     Row Name 04/28/23 1053 05/19/23 1043 06/19/23 1235 07/18/23 1601       Program Oxygen Prescription   Program Oxygen Prescription Continuous Continuous Continuous Continuous    Liters per minute 2 2 2 2       Home Oxygen   Home Oxygen Device E-Tanks;Home Concentrator E-Tanks;Home Concentrator E-Tanks;Home Concentrator E-Tanks;Home Concentrator    Sleep Oxygen Prescription Continuous Continuous Continuous Continuous    Liters per minute 1 1 1 1     Home Exercise Oxygen Prescription Continuous Continuous Continuous Continuous    Liters per minute 2 2 2 2     Home Resting Oxygen Prescription Continuous Continuous Continuous Continuous    Liters per minute 2 2 2 2     Compliance with Home Oxygen Use Yes Yes Yes Yes      Goals/Expected Outcomes   Short Term Goals To learn and exhibit compliance with exercise, home and travel O2 prescription;To learn and understand importance of maintaining oxygen saturations>88%;To learn and demonstrate proper use of respiratory medications;To learn and understand importance of monitoring SPO2 with pulse oximeter and demonstrate accurate use of the pulse oximeter.;To learn and demonstrate proper pursed lip breathing techniques or other breathing techniques.  To learn and exhibit compliance with exercise, home and travel O2 prescription;To learn and understand importance of maintaining oxygen saturations>88%;To learn and demonstrate proper use of respiratory medications;To learn and understand  importance of monitoring SPO2 with pulse oximeter and demonstrate accurate use of the pulse oximeter.;To learn and demonstrate proper pursed lip breathing techniques or other breathing techniques.  To learn and exhibit compliance with exercise, home and travel O2 prescription;To learn and understand importance of maintaining oxygen saturations>88%;To learn and  demonstrate proper use of respiratory medications;To learn and understand importance of monitoring SPO2 with pulse oximeter and demonstrate accurate use of the pulse oximeter.;To learn and demonstrate proper pursed lip breathing techniques or other breathing techniques.  To learn and exhibit compliance with exercise, home and travel O2 prescription;To learn and understand importance of maintaining oxygen saturations>88%;To learn and demonstrate proper use of respiratory medications;To learn and understand importance of monitoring SPO2 with pulse oximeter and demonstrate accurate use of the pulse oximeter.;To learn and demonstrate proper pursed lip breathing techniques or other breathing techniques.     Long  Term Goals Exhibits compliance with exercise, home  and travel O2 prescription;Verbalizes importance of monitoring SPO2 with pulse oximeter and return demonstration;Exhibits proper breathing techniques, such as pursed lip breathing or other method taught during program session;Maintenance of O2 saturations>88%;Compliance with respiratory medication Exhibits compliance with exercise, home  and travel O2 prescription;Verbalizes importance of monitoring SPO2 with pulse oximeter and return demonstration;Exhibits proper breathing techniques, such as pursed lip breathing or other method taught during program session;Maintenance of O2 saturations>88%;Compliance with respiratory medication Exhibits compliance with exercise, home  and travel O2 prescription;Verbalizes importance of monitoring SPO2 with pulse oximeter and return demonstration;Exhibits proper  breathing techniques, such as pursed lip breathing or other method taught during program session;Maintenance of O2 saturations>88%;Compliance with respiratory medication Exhibits compliance with exercise, home  and travel O2 prescription;Verbalizes importance of monitoring SPO2 with pulse oximeter and return demonstration;Exhibits proper breathing techniques, such as pursed lip breathing or other method taught during program session;Maintenance of O2 saturations>88%;Compliance with respiratory medication    Goals/Expected Outcomes Compliance and understanding of oxygen saturation monitoring and breathing techniques to decrease shortness of breath. Compliance and understanding of oxygen saturation monitoring and breathing techniques to decrease shortness of breath. Compliance and understanding of oxygen saturation monitoring and breathing techniques to decrease shortness of breath. Compliance and understanding of oxygen saturation monitoring and breathing techniques to decrease shortness of breath.             Oxygen Discharge (Final Oxygen Re-Evaluation):  Oxygen Re-Evaluation - 07/18/23 1601       Program Oxygen Prescription   Program Oxygen Prescription Continuous    Liters per minute 2      Home Oxygen   Home Oxygen Device E-Tanks;Home Concentrator    Sleep Oxygen Prescription Continuous    Liters per minute 1    Home Exercise Oxygen Prescription Continuous    Liters per minute 2    Home Resting Oxygen Prescription Continuous    Liters per minute 2    Compliance with Home Oxygen Use Yes      Goals/Expected Outcomes   Short Term Goals To learn and exhibit compliance with exercise, home and travel O2 prescription;To learn and understand importance of maintaining oxygen saturations>88%;To learn and demonstrate proper use of respiratory medications;To learn and understand importance of monitoring SPO2 with pulse oximeter and demonstrate accurate use of the pulse oximeter.;To learn and  demonstrate proper pursed lip breathing techniques or other breathing techniques.     Long  Term Goals Exhibits compliance with exercise, home  and travel O2 prescription;Verbalizes importance of monitoring SPO2 with pulse oximeter and return demonstration;Exhibits proper breathing techniques, such as pursed lip breathing or other method taught during program session;Maintenance of O2 saturations>88%;Compliance with respiratory medication    Goals/Expected Outcomes Compliance and understanding of oxygen saturation monitoring and breathing techniques to decrease shortness of breath.             Initial Exercise Prescription:  Initial Exercise Prescription - 04/24/23 0900       Date of Initial Exercise RX and Referring Provider   Date 04/24/23    Referring Provider [name]    Expected Discharge Date 07/20/23      Oxygen   Oxygen Continuous    Liters 2    Maintain Oxygen Saturation 88% or higher      NuStep   Level 1    SPM 50    Minutes 15      Track   Minutes 15    METs 1.5      Prescription Details   Frequency (times per week) 2    Duration Progress to 30 minutes of continuous aerobic without signs/symptoms of physical distress      Intensity   THRR 40-80% of Max Heartrate 62-124    Ratings of Perceived Exertion 11-13    Perceived Dyspnea 0-4      Progression   Progression Continue to progress workloads to maintain intensity without signs/symptoms of physical distress.      Resistance Training   Training Prescription Yes    Weight red bands    Reps 10-15             Perform Capillary Blood Glucose checks as needed.  Exercise Prescription Changes:   Exercise Prescription Changes     Row Name 05/02/23 0900 05/16/23 0900 05/30/23 0900 06/13/23 0900 06/27/23 0900     Response to Exercise   Blood Pressure (Admit) 118/70 124/64 120/64 130/70 114/68   Blood Pressure (Exercise) 140/70 142/62 160/72 172/66 124/58   Blood Pressure (Exit) 106/64 102/62 118/62  112/66 120/68   Heart Rate (Admit) 109 bpm 118 bpm 111 bpm 107 bpm 111 bpm   Heart Rate (Exercise) 119 bpm 126 bpm 124 bpm 126 bpm 124 bpm   Heart Rate (Exit) 111 bpm 116 bpm 117 bpm 107 bpm 95 bpm   Oxygen Saturation (Admit) 100 % 100 % 100 % 98 % 95 %   Oxygen Saturation (Exercise) 100 % 100 % 100 % 91 % 92 %   Oxygen Saturation (Exit) 100 % 100 % 100 % 96 % 97 %   Rating of Perceived Exertion (Exercise) 13 13 15 15 12    Perceived Dyspnea (Exercise) 1 3 3 4 1    Duration Continue with 30 min of aerobic exercise without signs/symptoms of physical distress. Continue with 30 min of aerobic exercise without signs/symptoms of physical distress. Continue with 30 min of aerobic exercise without signs/symptoms of physical distress. Continue with 30 min of aerobic exercise without signs/symptoms of physical distress. Continue with 30 min of aerobic exercise without signs/symptoms of physical distress.   Intensity THRR unchanged THRR unchanged THRR unchanged THRR unchanged THRR unchanged     Progression   Progression Continue to progress workloads to maintain intensity without signs/symptoms of physical distress. Continue to progress workloads to maintain intensity without signs/symptoms of physical distress. Continue to progress workloads to maintain intensity without signs/symptoms of physical distress. Continue to progress workloads to maintain intensity without signs/symptoms of physical distress. Continue to progress workloads to maintain intensity without signs/symptoms of physical distress.     Resistance Training   Training Prescription Yes Yes Yes Yes Yes   Weight red bands red bands red bands red bands red bands   Reps 10-15 10-15 10-15 10-15 10-15   Time 10 Minutes 10 Minutes 10 Minutes 10 Minutes 10 Minutes     Oxygen   Oxygen Continuous Continuous --  Weaned to room  air -- --   Liters 2 2 -- -- --     NuStep   Level 1 3 3 3 3    SPM -- -- 80 -- --   Minutes 15 15 15 15 15    METs 2.4  2.7 3.1 3 3      Track   Laps 7.5 8 5.5 5 5    Minutes 15 15 15 15 15    METs 2.23 2.23 1.85 1.77 1.77     Oxygen   Maintain Oxygen Saturation 88% or higher 88% or higher 88% or higher 88% or higher --    Row Name 07/06/23 0947             Response to Exercise   Blood Pressure (Admit) 116/64       Blood Pressure (Exit) 104/60       Heart Rate (Admit) 115 bpm       Heart Rate (Exercise) 122 bpm       Heart Rate (Exit) 109 bpm       Oxygen Saturation (Admit) 100 %       Oxygen Saturation (Exercise) 96 %       Oxygen Saturation (Exit) 100 %       Rating of Perceived Exertion (Exercise) 15       Perceived Dyspnea (Exercise) 3       Duration Continue with 30 min of aerobic exercise without signs/symptoms of physical distress.       Intensity THRR unchanged         Progression   Progression Continue to progress workloads to maintain intensity without signs/symptoms of physical distress.       Average METs 2.7         Resistance Training   Training Prescription Yes       Weight red bands       Reps 10-15       Time 10 Minutes         Oxygen   Oxygen Continuous       Liters 2         NuStep   Level 3       SPM 71       Minutes 15       METs 2.7         Track   Laps 5.5       Minutes 15       METs 1.82         Oxygen   Maintain Oxygen Saturation 88% or higher                Exercise Comments:   Exercise Comments     Row Name 06/06/23 1539           Exercise Comments Completed home [facility]. Skip is currently exercising at home. He uses the seated cycle machine and is walking 2-7 days/wk for 15-20 min/day. I encouraged him to increase his time to 30 min/day. Skip tries walking outside when the weather is good. I recommended he keep walking outside weather permitted. Skip agreed with my recommendations. I am confident in Skip completing an exercise regimen at home. He seems motivated to exercise and improve his functional capacity.                 Exercise Goals and Review:   Exercise Goals     Row Name 04/24/23 1610 04/28/23 1052 05/19/23 1039 06/19/23 1228       Exercise Goals   Increase Physical  Activity Yes Yes Yes Yes    Intervention Provide advice, education, support and counseling about physical activity/exercise needs.;Develop an individualized exercise prescription for aerobic and resistive training based on initial evaluation findings, risk stratification, comorbidities and participant's personal goals. Provide advice, education, support and counseling about physical activity/exercise needs.;Develop an individualized exercise prescription for aerobic and resistive training based on initial evaluation findings, risk stratification, comorbidities and participant's personal goals. Provide advice, education, support and counseling about physical activity/exercise needs.;Develop an individualized exercise prescription for aerobic and resistive training based on initial evaluation findings, risk stratification, comorbidities and participant's personal goals. Provide advice, education, support and counseling about physical activity/exercise needs.;Develop an individualized exercise prescription for aerobic and resistive training based on initial evaluation findings, risk stratification, comorbidities and participant's personal goals.    Expected Outcomes Short Term: Attend rehab on a regular basis to increase amount of physical activity.;Long Term: Exercising regularly at least 3-5 days a week.;Long Term: Add in home exercise to make exercise part of routine and to increase amount of physical activity. Short Term: Attend rehab on a regular basis to increase amount of physical activity.;Long Term: Exercising regularly at least 3-5 days a week.;Long Term: Add in home exercise to make exercise part of routine and to increase amount of physical activity. Short Term: Attend rehab on a regular basis to increase amount of physical activity.;Long  Term: Exercising regularly at least 3-5 days a week.;Long Term: Add in home exercise to make exercise part of routine and to increase amount of physical activity. Short Term: Attend rehab on a regular basis to increase amount of physical activity.;Long Term: Exercising regularly at least 3-5 days a week.;Long Term: Add in home exercise to make exercise part of routine and to increase amount of physical activity.    Increase Strength and Stamina Yes Yes Yes Yes    Intervention Provide advice, education, support and counseling about physical activity/exercise needs.;Develop an individualized exercise prescription for aerobic and resistive training based on initial evaluation findings, risk stratification, comorbidities and participant's personal goals. Provide advice, education, support and counseling about physical activity/exercise needs.;Develop an individualized exercise prescription for aerobic and resistive training based on initial evaluation findings, risk stratification, comorbidities and participant's personal goals. Provide advice, education, support and counseling about physical activity/exercise needs.;Develop an individualized exercise prescription for aerobic and resistive training based on initial evaluation findings, risk stratification, comorbidities and participant's personal goals. Provide advice, education, support and counseling about physical activity/exercise needs.;Develop an individualized exercise prescription for aerobic and resistive training based on initial evaluation findings, risk stratification, comorbidities and participant's personal goals.    Expected Outcomes Short Term: Increase workloads from initial exercise prescription for resistance, speed, and METs.;Short Term: Perform resistance training exercises routinely during rehab and add in resistance training at home;Long Term: Improve cardiorespiratory fitness, muscular endurance and strength as measured by increased METs and  functional capacity ( ) Short Term: Increase workloads from initial exercise prescription for resistance, speed, and METs.;Short Term: Perform resistance training exercises routinely during rehab and add in resistance training at home;Long Term: Improve cardiorespiratory fitness, muscular endurance and strength as measured by increased METs and functional capacity ( ) Short Term: Increase workloads from initial exercise prescription for resistance, speed, and METs.;Short Term: Perform resistance training exercises routinely during rehab and add in resistance training at home;Long Term: Improve cardiorespiratory fitness, muscular endurance and strength as measured by increased METs and functional capacity ( ) Short Term: Increase workloads from initial exercise prescription for resistance, speed, and METs.;Short Term: Perform resistance training  exercises routinely during rehab and add in resistance training at home;Long Term: Improve cardiorespiratory fitness, muscular endurance and strength as measured by increased METs and functional capacity ( )    Able to understand and use rate of perceived exertion (RPE) scale Yes Yes Yes Yes    Intervention Provide education and explanation on how to use RPE scale Provide education and explanation on how to use RPE scale Provide education and explanation on how to use RPE scale Provide education and explanation on how to use RPE scale    Expected Outcomes Short Term: Able to use RPE daily in rehab to express subjective intensity level;Long Term:  Able to use RPE to guide intensity level when exercising independently Short Term: Able to use RPE daily in rehab to express subjective intensity level;Long Term:  Able to use RPE to guide intensity level when exercising independently Short Term: Able to use RPE daily in rehab to express subjective intensity level;Long Term:  Able to use RPE to guide intensity level when exercising independently Short Term: Able to use  RPE daily in rehab to express subjective intensity level;Long Term:  Able to use RPE to guide intensity level when exercising independently    Able to understand and use Dyspnea scale Yes Yes Yes Yes    Intervention Provide education and explanation on how to use Dyspnea scale Provide education and explanation on how to use Dyspnea scale Provide education and explanation on how to use Dyspnea scale Provide education and explanation on how to use Dyspnea scale    Expected Outcomes Short Term: Able to use Dyspnea scale daily in rehab to express subjective sense of shortness of breath during exertion;Long Term: Able to use Dyspnea scale to guide intensity level when exercising independently Short Term: Able to use Dyspnea scale daily in rehab to express subjective sense of shortness of breath during exertion;Long Term: Able to use Dyspnea scale to guide intensity level when exercising independently Short Term: Able to use Dyspnea scale daily in rehab to express subjective sense of shortness of breath during exertion;Long Term: Able to use Dyspnea scale to guide intensity level when exercising independently Short Term: Able to use Dyspnea scale daily in rehab to express subjective sense of shortness of breath during exertion;Long Term: Able to use Dyspnea scale to guide intensity level when exercising independently    Knowledge and understanding of Target Heart Rate Range (THRR) Yes Yes Yes Yes    Intervention Provide education and explanation of THRR including how the numbers were predicted and where they are located for reference Provide education and explanation of THRR including how the numbers were predicted and where they are located for reference Provide education and explanation of THRR including how the numbers were predicted and where they are located for reference Provide education and explanation of THRR including how the numbers were predicted and where they are located for reference    Expected  Outcomes Short Term: Able to state/look up THRR;Long Term: Able to use THRR to govern intensity when exercising independently;Short Term: Able to use daily as guideline for intensity in rehab Short Term: Able to state/look up THRR;Long Term: Able to use THRR to govern intensity when exercising independently;Short Term: Able to use daily as guideline for intensity in rehab Short Term: Able to state/look up THRR;Long Term: Able to use THRR to govern intensity when exercising independently;Short Term: Able to use daily as guideline for intensity in rehab Short Term: Able to state/look up THRR;Long Term: Able to use THRR  to govern intensity when exercising independently;Short Term: Able to use daily as guideline for intensity in rehab    Understanding of Exercise Prescription Yes Yes Yes Yes    Intervention Provide education, explanation, and written materials on patient's individual exercise prescription Provide education, explanation, and written materials on patient's individual exercise prescription Provide education, explanation, and written materials on patient's individual exercise prescription Provide education, explanation, and written materials on patient's individual exercise prescription    Expected Outcomes Short Term: Able to explain program exercise prescription;Long Term: Able to explain home exercise prescription to exercise independently Short Term: Able to explain program exercise prescription;Long Term: Able to explain home exercise prescription to exercise independently Short Term: Able to explain program exercise prescription;Long Term: Able to explain home exercise prescription to exercise independently Short Term: Able to explain program exercise prescription;Long Term: Able to explain home exercise prescription to exercise independently             Exercise Goals Re-Evaluation :  Exercise Goals Re-Evaluation     Row Name 04/28/23 1052 05/19/23 1039 06/19/23 1228 07/18/23 1557        Exercise Goal Re-Evaluation   Exercise Goals Review Increase Physical Activity;Able to understand and use Dyspnea scale;Understanding of Exercise Prescription;Increase Strength and Stamina;Knowledge and understanding of Target Heart Rate Range (THRR);Able to understand and use rate of perceived exertion (RPE) scale Increase Physical Activity;Able to understand and use Dyspnea scale;Understanding of Exercise Prescription;Increase Strength and Stamina;Knowledge and understanding of Target Heart Rate Range (THRR);Able to understand and use rate of perceived exertion (RPE) scale Increase Physical Activity;Able to understand and use Dyspnea scale;Understanding of Exercise Prescription;Increase Strength and Stamina;Knowledge and understanding of Target Heart Rate Range (THRR);Able to understand and use rate of perceived exertion (RPE) scale Increase Physical Activity;Able to understand and use Dyspnea scale;Understanding of Exercise Prescription;Increase Strength and Stamina;Knowledge and understanding of Target Heart Rate Range (THRR);Able to understand and use rate of perceived exertion (RPE) scale    Comments Skip is scheduled to begin exercise next week. Will continue to monitor and progress as able. Skip has completed 5 exercise sessions. He exercises for 15 min on the Nustep and track. Pt averages 2.7 METs at level 3 on the Nustep and 2.23 METs on the track. Skip performs the warmup and cooldown standing/ seated dependent on his shortness of breath. He has increased his workload for the Nustep as METs have slightly increased. It is too soon to notate any major progressions. Will continue to monitor and progress as able. Skip has completed 13 exercise sessions. He exercises for 15 min on the Nustep and track. Pt averages 3.4 METs at level 3 on the Nustep and 2.08 METs on the track. Skip performs the warmup and cooldown standing/ seated dependent on his shortness of breath. His METs have increased for the Nustep  and have remained the same for the track. Skip has most likely reached his functional capacity. Skip sits less during the warmup and cooldown. He feels that he have improved since starting PR. Skip has completed 19 exercise sessions. He exercises for 15 min on the Nustep and track. Pt averages 2.7 METs at level 3 on the Nustep and 1.77 METs on the track. Skip performs the warmup and cooldown standing/ seated dependent on his shortness of breath. Skip's METs have remained relatively the same because he has had other health issues and had to miss class. He is motivated to exercise as he tried to exercise at his best ability today. We have discussed  home exercise. Skip is trying exercise at home as often as he can. Will continue to monitor and progress as able.    Expected Outcomes Through exercise at rehab and home, the patient will decrease shortness of breath with daily activities and feel confident in carrying out an exercise regimen at home. Through exercise at rehab and home, the patient will decrease shortness of breath with daily activities and feel confident in carrying out an exercise regimen at home. Through exercise at rehab and home, the patient will decrease shortness of breath with daily activities and feel confident in carrying out an exercise regimen at home. Through exercise at rehab and home, the patient will decrease shortness of breath with daily activities and feel confident in carrying out an exercise regimen at home.             Discharge Exercise Prescription (Final Exercise Prescription Changes):  Exercise Prescription Changes - 07/06/23 0947       Response to Exercise   Blood Pressure (Admit) 116/64    Blood Pressure (Exit) 104/60    Heart Rate (Admit) 115 bpm    Heart Rate (Exercise) 122 bpm    Heart Rate (Exit) 109 bpm    Oxygen Saturation (Admit) 100 %    Oxygen Saturation (Exercise) 96 %    Oxygen Saturation (Exit) 100 %    Rating of Perceived Exertion (Exercise) 15     Perceived Dyspnea (Exercise) 3    Duration Continue with 30 min of aerobic exercise without signs/symptoms of physical distress.    Intensity THRR unchanged      Progression   Progression Continue to progress workloads to maintain intensity without signs/symptoms of physical distress.    Average METs 2.7      Resistance Training   Training Prescription Yes    Weight red bands    Reps 10-15    Time 10 Minutes      Oxygen   Oxygen Continuous    Liters 2      NuStep   Level 3    SPM 71    Minutes 15    METs 2.7      Track   Laps 5.5    Minutes 15    METs 1.82      Oxygen   Maintain Oxygen Saturation 88% or higher             Nutrition:  Target Goals: Understanding of nutrition guidelines, daily intake of sodium 1500mg , cholesterol 200mg , calories 30% from fat and 7% or less from saturated fats, daily to have 5 or more servings of fruits and vegetables.  Biometrics:  Pre Biometrics - 04/24/23 0914       Pre Biometrics   Grip Strength 29 kg              Nutrition Therapy Plan and Nutrition Goals:  Nutrition Therapy & Goals - 07/25/23 1140       Nutrition Therapy   Diet Heart Healthy Diet    Drug/Food Interactions Statins/Certain Fruits      Personal Nutrition Goals   Nutrition Goal Patient to identify strategies for weight maintenance/ weight gain of 0.5-2.0# per week.   goal in progress.   Comments Goals in progress. Skip continues regular follow-up for kidney transplant; he has required dialysis since heart transplant in 2021. He currently receives hemodialysis M/W/F. His weight has been stable since hospitalization related to pneumonia in May 2024, and per diaylsis notes, his typical weight has been ~60kg. He is  down about 3.5# since orientation to our program (start weight was 61.1kg). He lives with his sister who is a great support and does all of the grocery shopping and cooking. Skip reports drinking 1 Boost Breeze daily (250kcals, 9g protein)  and 1 [employer] Milkshake (580kcals, 13g protein) daily to aid with weight gain/weight maintenance. We have discussed multiple strategies for weight gain including increasing eating frequency, protein supplements, increasing calories from fat/protein/carbohydrates, etc. He is currently hospitalized for acute respiratory failure; will continue to monitor goals upon patient's return. Patient will benefit from participation in pulmonary rehab for nutrition, exercise, and lifestyle modification.      Intervention Plan   Intervention Prescribe, educate and counsel regarding individualized specific dietary modifications aiming towards targeted core components such as weight, hypertension, lipid management, diabetes, heart failure and other comorbidities.;Nutrition handout(s) given to patient.    Expected Outcomes Long Term Goal: Adherence to prescribed nutrition plan.;Short Term Goal: Understand basic principles of dietary content, such as calories, fat, sodium, cholesterol and nutrients.             Nutrition Assessments:  Nutrition Assessments - 05/02/23 1113       Rate Your Plate Scores   Pre Score 62            MEDIFICTS Score Key: >=70 Need to make dietary changes  40-70 Heart Healthy Diet <= 40 Therapeutic Level Cholesterol Diet  Flowsheet Row PULMONARY REHAB OTHER RESPIRATORY from 05/02/2023 in [facility]  Picture Your Plate Total Score on Admission 62      Picture Your Plate Scores: <16 Unhealthy dietary pattern with much room for improvement. 41-50 Dietary pattern unlikely to meet recommendations for good health and room for improvement. 51-60 More healthful dietary pattern, with some room for improvement.  >60 Healthy dietary pattern, although there may be some specific behaviors that could be improved.    Nutrition Goals Re-Evaluation:  Nutrition Goals Re-Evaluation     Row Name 05/02/23 1010 05/30/23 1106  06/29/23 0934 07/25/23 1140       Goals   Current Weight 134 lb 11.2 oz (61.1 kg) 132 lb 0.9 oz (59.9 kg) 133 lb 9.6 oz (60.6 kg) 131 lb 2.8 oz (59.5 kg)    Comment GFR 11, Cr 5.5, PTH 194, triglycerides 161, HDL 34, Albumin 2.3 he continues regular follow-up with [facility] s/p heart transplant. Other most recent labs  GFR 11, Cr 5.5, PTH 194, triglycerides 161, HDL 34, Albumin 2.3 he continues regular follow-up with [facility] healthcare s/p heart transplant. Other most recent labs GFR 11, Cr 5.5, PTH 194, triglycerides 161, HDL 34, Albumin 2.3 Cr 4.99, Phos 5.6, [id number]; other most recent labs monitored by [facility] transplant team, triglycerides 161, HDL 34,    Expected Outcome Skip continues regular follow-up for kidney transplant; he has required dialysis since heart transplant in 2021. He currently receives hemodialysis M/W/F. His weight has been stable since hospitalization related to pneumonia in May 2024, and per diaylsis notes, his typical weight has been ~60kg. He lives with his sister who is a great support and does all of the grocery shopping and cooking. Skip reports drinking 1 Boost Breeze daily (250kcals, 9g protein) and 1 Chick-fil[employer] Milkshake (580kcals, 13g protein) daily to aid with weight gain/weight maintenance. Patient will benefit from participation in pulmonary rehab for nutrition, exercise, and lifestyle modification. Goals in progress. Skip continues regular follow-up for kidney transplant; he has required dialysis since heart transplant in 2021.  He currently receives hemodialysis M/W/F. His weight has been stable since hospitalization related to pneumonia in May 2024, and per diaylsis notes, his typical weight has been ~60kg. He is down about 4.6# since orientation to our program (start weight was 61.1kg). He lives with his sister who is a great support and does all of the grocery shopping and cooking. Skip reports drinking 1 Boost Breeze daily (250kcals, 9g protein) and 1 Chick-fil-a  Milkshake (580kcals, 13g protein) daily to aid with weight gain/weight maintenance. We have discussed multiple strategies for weight gain including increasing eating frequency, protein supplements, increasing calories from fat/protein/carbohydrates, etc. Patient will benefit from participation in pulmonary rehab for nutrition, exercise, and lifestyle modification. Goals in progress. Skip continues regular follow-up for kidney transplant; he has required dialysis since heart transplant in 2021. He currently receives hemodialysis M/W/F. His weight has been stable since hospitalization related to pneumonia in May 2024, and per diaylsis notes, his typical weight has been ~60kg. He is down about 2.2# since orientation to our program (start weight was 61.1kg). He lives with his sister who is a great support and does all of the grocery shopping and cooking. Skip reports drinking 1 Boost Breeze daily (250kcals, 9g protein) and 1 Chick-fil-a Milkshake (580kcals, 13g protein) daily to aid with weight gain/weight maintenance. We have discussed multiple strategies for weight gain including increasing eating frequency, protein supplements, increasing calories from fat/protein/carbohydrates, etc. Patient will benefit from participation in pulmonary rehab for nutrition, exercise, and lifestyle modification. Goals in progress. Skip continues regular follow-up for kidney transplant; he has required dialysis since heart transplant in 2021. He currently receives hemodialysis M/W/F. His weight has been stable since hospitalization related to pneumonia in May 2024, and per diaylsis notes, his typical weight has been ~60kg. He is down about 3.5# since orientation to our program (start weight was 61.1kg). He lives with his sister who is a great support and does all of the grocery shopping and cooking. Skip reports drinking 1 Boost Breeze daily (250kcals, 9g protein) and 1 Chick-fil-a Milkshake (580kcals, 13g protein) daily to aid with  weight gain/weight maintenance. We have discussed multiple strategies for weight gain including increasing eating frequency, protein supplements, increasing calories from fat/protein/carbohydrates, etc. He is currently hospitalized for acute respiratory failure; will continue to monitor goals upon patient's return. Patient will benefit from participation in pulmonary rehab for nutrition, exercise, and lifestyle modification.             Nutrition Goals Discharge (Final Nutrition Goals Re-Evaluation):  Nutrition Goals Re-Evaluation - 07/25/23 1140       Goals   Current Weight 131 lb 2.8 oz (59.5 kg)    Comment Cr 4.99, Phos 5.6, [id number]; other most recent labs monitored by [facility] transplant team, triglycerides 161, HDL 34,    Expected Outcome Goals in progress. Skip continues regular follow-up for kidney transplant; he has required dialysis since heart transplant in 2021. He currently receives hemodialysis M/W/F. His weight has been stable since hospitalization related to pneumonia in May 2024, and per diaylsis notes, his typical weight has been ~60kg. He is down about 3.5# since orientation to our program (start weight was 61.1kg). He lives with his sister who is a great support and does all of the grocery shopping and cooking. Skip reports drinking 1 Boost Breeze daily (250kcals, 9g protein) and 1 Chick-fil-a Milkshake (580kcals, 13g protein) daily to aid with weight gain/weight maintenance. We have discussed multiple strategies for weight gain including increasing eating frequency, protein supplements, increasing calories  from fat/protein/carbohydrates, etc. He is currently hospitalized for acute respiratory failure; will continue to monitor goals upon patient's return. Patient will benefit from participation in pulmonary rehab for nutrition, exercise, and lifestyle modification.             Psychosocial: Target Goals: Acknowledge presence or absence of significant depression and/or stress,  maximize coping skills, provide positive support system. Participant is able to verbalize types and ability to use techniques and skills needed for reducing stress and depression.  Initial Review & Psychosocial Screening:  Initial Psych Review & Screening - 04/24/23 0923       Initial Review   Current issues with None Identified      Family Dynamics   Good Support System? Yes    Comments sister and other family members      Barriers   Psychosocial barriers to participate in program There are no identifiable barriers or psychosocial needs.      Screening Interventions   Interventions Encouraged to exercise             Quality of Life Scores:  Scores of 19 and below usually indicate a poorer quality of life in these areas.  A difference of  2-3 points is a clinically meaningful difference.  A difference of 2-3 points in the total score of the Quality of Life Index has been associated with significant improvement in overall quality of life, self-image, physical symptoms, and general health in studies assessing change in quality of life.  PHQ-9: Review Flowsheet       04/24/2023  Depression screen PHQ 2/9  Decreased Interest 0  Down, Depressed, Hopeless 0  PHQ - 2 Score 0  Altered sleeping 0  Tired, decreased energy 0  Change in appetite 0  Feeling bad or failure about yourself  0  Trouble concentrating 0  Moving slowly or fidgety/restless 0  Suicidal thoughts 0  PHQ-9 Score 0  Difficult doing work/chores Not difficult at all    Details           Interpretation of Total Score  Total Score Depression Severity:  1-4 = Minimal depression, 5-9 = Mild depression, 10-14 = Moderate depression, 15-19 = Moderately severe depression, 20-27 = Severe depression   Psychosocial Evaluation and Intervention:  Psychosocial Evaluation - 04/24/23 0923       Psychosocial Evaluation & Interventions   Interventions Encouraged to exercise with the program and follow exercise  prescription    Comments Skip denies any psychosocial barriers to exercise.    Expected Outcomes For Skip to participate in PR free of psychosocial barriers.    Continue Psychosocial Services  No Follow up required             Psychosocial Re-Evaluation:  Psychosocial Re-Evaluation     Row Name 04/26/23 1550 05/24/23 1223 06/19/23 1210 07/19/23 0913       Psychosocial Re-Evaluation   Current issues with None Identified None Identified None Identified None Identified    Comments No change in psychosocial assessment since orientation 04/24/2023. No concerns identified No new psychosocial barriers or concerns at this time. Skip is enjoying PR and denies any stressors. He states that his mental health is doing "fine". Skip states that his sister does a good job taking care of him and monitoring his overnight oxygen saturations. He is tolerating his HD sessions on T, Th, Sat well. He stated he is getting worked up for a kidney transplant. Skip stated he is looking forward to traveling and going to baseball  games with his sister. Skip denies any needs at this time. Skip enjoy's coming to PR and works really hard during class. He has had a little bit of a setback in the last couple of weeks as he had to have a thoracentesis. This has made it harder for him to breathe, but he pushes through and tries his hardest in class. He denies any needs at this time.    Expected Outcomes For Skip to attend PR without any barriers or concerns For Skip to attend PR without any barriers or concerns For Skip to attend PR without any barriers or concerns For Skip to attend PR without any barriers or concerns    Interventions Encouraged to attend Pulmonary Rehabilitation for the exercise Encouraged to attend Pulmonary Rehabilitation for the exercise Encouraged to attend Pulmonary Rehabilitation for the exercise Encouraged to attend Pulmonary Rehabilitation for the exercise    Continue Psychosocial Services  No Follow up  required No Follow up required No Follow up required No Follow up required             Psychosocial Discharge (Final Psychosocial Re-Evaluation):  Psychosocial Re-Evaluation - 07/19/23 0913       Psychosocial Re-Evaluation   Current issues with None Identified    Comments Skip enjoy's coming to PR and works really hard during class. He has had a little bit of a setback in the last couple of weeks as he had to have a thoracentesis. This has made it harder for him to breathe, but he pushes through and tries his hardest in class. He denies any needs at this time.    Expected Outcomes For Skip to attend PR without any barriers or concerns    Interventions Encouraged to attend Pulmonary Rehabilitation for the exercise    Continue Psychosocial Services  No Follow up required             Education: Education Goals: Education classes will be provided on a weekly basis, covering required topics. Participant will state understanding/return demonstration of topics presented.  Learning Barriers/Preferences:  Learning Barriers/Preferences - 04/24/23 0924       Learning Barriers/Preferences   Learning Barriers Sight    Learning Preferences Skilled Demonstration             Education Topics: Know Your Numbers Group instruction that is supported by a PowerPoint presentation. Instructor discusses importance of knowing and understanding resting, exercise, and post-exercise oxygen saturation, heart rate, and blood pressure. Oxygen saturation, heart rate, blood pressure, rating of perceived exertion, and dyspnea are reviewed along with a normal range for these values.  Flowsheet Row PULMONARY REHAB OTHER RESPIRATORY from 05/04/2023 in [facility], [facility]  Date 05/04/23  Educator EP  Instruction Review Code 1- Verbalizes Understanding       Exercise for the Pulmonary Patient Group instruction that is supported by a PowerPoint  presentation. Instructor discusses benefits of exercise, core components of exercise, frequency, duration, and intensity of an exercise routine, importance of utilizing pulse oximetry during exercise, safety while exercising, and options of places to exercise outside of rehab.    MET Level  Group instruction provided by PowerPoint, verbal discussion, and written material to support subject matter. Instructor reviews what METs are and how to increase METs.  Flowsheet Row PULMONARY REHAB OTHER RESPIRATORY from 06/22/2023 in [facility] for Heart, Vascular, & Lung Health  Date 06/22/23  Educator EP  Instruction Review Code 1- Verbalizes Understanding  Pulmonary Medications Verbally interactive group education provided by instructor with focus on inhaled medications and proper administration.   Anatomy and Physiology of the Respiratory System Group instruction provided by PowerPoint, verbal discussion, and written material to support subject matter. Instructor reviews respiratory cycle and anatomical components of the respiratory system and their functions. Instructor also reviews differences in obstructive and restrictive respiratory diseases with examples of each.  Flowsheet Row PULMONARY REHAB OTHER RESPIRATORY from 07/06/2023 in [facility] for Heart, Vascular, & Lung Health  Date 07/06/23  Educator RT  Instruction Review Code 1- Verbalizes Understanding       Oxygen Safety Group instruction provided by PowerPoint, verbal discussion, and written material to support subject matter. There is an overview of "What is Oxygen" and "Why do we need it".  Instructor also reviews how to create a safe environment for oxygen use, the importance of using oxygen as prescribed, and the risks of noncompliance. There is a brief discussion on traveling with oxygen and resources the patient may utilize. Flowsheet Row PULMONARY REHAB OTHER RESPIRATORY from  05/11/2023 in [facility] for Heart, Vascular, & Lung Health  Date 05/11/23  Educator RN  Instruction Review Code 1- Verbalizes Understanding       Oxygen Use Group instruction provided by PowerPoint, verbal discussion, and written material to discuss how supplemental oxygen is prescribed and different types of oxygen supply systems. Resources for more information are provided.    Breathing Techniques Group instruction that is supported by demonstration and informational handouts. Instructor discusses the benefits of pursed lip and diaphragmatic breathing and detailed demonstration on how to perform both.  Flowsheet Row PULMONARY REHAB OTHER RESPIRATORY from 05/25/2023 in [facility] for Heart, Vascular, & Lung Health  Date 05/25/23  Educator RN  Instruction Review Code 1- Verbalizes Understanding        Risk Factor Reduction Group instruction that is supported by a PowerPoint presentation. Instructor discusses the definition of a risk factor, different risk factors for pulmonary disease, and how the heart and lungs work together. Flowsheet Row PULMONARY REHAB OTHER RESPIRATORY from 06/15/2023 in [facility] for Heart, Vascular, & Lung Health  Date 06/15/23  Educator EP  Instruction Review Code 1- Verbalizes Understanding       Pulmonary Diseases Group instruction provided by PowerPoint, verbal discussion, and written material to support subject matter. Instructor gives an overview of the different type of pulmonary diseases. There is also a discussion on risk factors and symptoms as well as ways to manage the diseases.   Stress and Energy Conservation Group instruction provided by PowerPoint, verbal discussion, and written material to support subject matter. Instructor gives an overview of stress and the impact it can have on the body. Instructor also reviews ways to reduce stress. There is also a discussion on  energy conservation and ways to conserve energy throughout the day.   Warning Signs and Symptoms Group instruction provided by PowerPoint, verbal discussion, and written material to support subject matter. Instructor reviews warning signs and symptoms of stroke, heart attack, cold and flu. Instructor also reviews ways to prevent the spread of infection.   Other Education Group or individual verbal, written, or video instructions that support the educational goals of the pulmonary rehab program. Flowsheet Row PULMONARY REHAB OTHER RESPIRATORY from 06/29/2023 in [facility] for Heart, Vascular, & Lung Health  Date 06/29/23  Educator RT  Instruction Review Code 1- Verbalizes Understanding  Knowledge Questionnaire Score:  Knowledge Questionnaire Score - 04/24/23 0924       Knowledge Questionnaire Score   Pre Score 18/18             Core Components/Risk Factors/Patient Goals at Admission:  Personal Goals and Risk Factors at Admission - 04/24/23 0928       Core Components/Risk Factors/Patient Goals on Admission    Weight Management Yes;Weight Gain    Intervention Weight Management: Develop a combined nutrition and exercise program designed to reach desired caloric intake, while maintaining appropriate intake of nutrient and fiber, sodium and fats, and appropriate energy expenditure required for the weight goal.;Weight Management: Provide education and appropriate resources to help participant work on and attain dietary goals.    Expected Outcomes Weight Gain: Understanding of general recommendations for a high calorie, high protein meal plan that promotes weight gain by distributing calorie intake throughout the day with the consumption for 4-5 meals, snacks, and/or supplements;Understanding of distribution of calorie intake throughout the day with the consumption of 4-5 meals/snacks;Understanding recommendations for meals to include 15-35% energy as  protein, 25-35% energy from fat, 35-60% energy from carbohydrates, less than 200mg  of dietary cholesterol, 20-35 gm of total fiber daily;Short Term: Continue to assess and modify interventions until short term weight is achieved;Long Term: Adherence to nutrition and physical activity/exercise program aimed toward attainment of established weight goal    Improve shortness of breath with ADL's Yes    Intervention Provide education, individualized exercise plan and daily activity instruction to help decrease symptoms of SOB with activities of daily living.    Expected Outcomes Short Term: Improve cardiorespiratory fitness to achieve a reduction of symptoms when performing ADLs;Long Term: Be able to perform more ADLs without symptoms or delay the onset of symptoms             Core Components/Risk Factors/Patient Goals Review:   Goals and Risk Factor Review     Row Name 04/26/23 1552 05/24/23 1226 06/19/23 1244 07/19/23 0917       Core Components/Risk Factors/Patient Goals Review   Personal Goals Review Weight Management/Obesity;Develop more efficient breathing techniques such as purse lipped breathing and diaphragmatic breathing and practicing self-pacing with activity.;Improve shortness of breath with ADL's Weight Management/Obesity;Develop more efficient breathing techniques such as purse lipped breathing and diaphragmatic breathing and practicing self-pacing with activity.;Improve shortness of breath with ADL's Weight Management/Obesity;Develop more efficient breathing techniques such as purse lipped breathing and diaphragmatic breathing and practicing self-pacing with activity.;Improve shortness of breath with ADL's Weight Management/Obesity;Improve shortness of breath with ADL's    Review Unable to evaluate any progress towards goals yet. Skip is scheduled to begin on 7/9. Goal progressing for weight gain. Skip is working with staff dietician to obtain weight gain goal. Goal progressing for  improve shortness of breath with ADL's. Goal progressing on developing more efficient breathing techniques such as purse lipped breathing and diaphragmatic breathing. Goal not progressing for weight gain. Skip has not gained any weight since starting the program. Skip and his sister are working with our staff dietician to gain weight. He is eating a renal diet. Goal progressing for improving shortness of breath with ADL's. We have weaned Skip off his oxygen since starting the program. He still uses oxygen at night. O2 saturations have been > 88% while exercising. Goal met on developing more efficient breathing techniques such as purse lipped breathing and diaphragmatic breathing. Skip can initiate PLB on when he is short of breath while exercising. He states he  has been practicing diaphragmatic breathing at home. Skip is enjoying the class and stated he can see the progress. Goal not progressing for weight gain. Skip is down 2.2# since orientation. Skip is working with dietician to achieve weight gain goals. Goal progressing for improving shortness of breath with ADL's. We had weaned Skip off his oxygen during class, but due to his recent pleural effusion and thoracentesis he has had to be placed back on oxygen while exercising. We will continue to monitor Skip's progress throughout the program.    Expected Outcomes For Skip to gain weight, improve his shortness of breath with ADLs, and develop more efficient breathing techniques. See admission goals For Skip to gain weight, improve his shortness of breath with his ADLs & develop more efficient breathing techniques For Skip to gain weight and improve SOB with his ADL's.             Core Components/Risk Factors/Patient Goals at Discharge (Final Review):   Goals and Risk Factor Review - 07/19/23 0917       Core Components/Risk Factors/Patient Goals Review   Personal Goals Review Weight Management/Obesity;Improve shortness of breath with ADL's    Review  Goal not progressing for weight gain. Skip is down 2.2# since orientation. Skip is working with dietician to achieve weight gain goals. Goal progressing for improving shortness of breath with ADL's. We had weaned Skip off his oxygen during class, but due to his recent pleural effusion and thoracentesis he has had to be placed back on oxygen while exercising. We will continue to monitor Skip's progress throughout the program.    Expected Outcomes For Skip to gain weight and improve SOB with his ADL's.             ITP Comments: Pt is making expected progress toward Pulmonary Rehab goals after completing 19 sessions. Recommend continued exercise, life style modification, education, and utilization of breathing techniques to increase stamina and strength, while also decreasing shortness of breath with exertion.  Dr. Mechele Collin is Medical Director for Pulmonary Rehab at [facility].

## 2023-07-26 NOTE — Progress Notes (Signed)
I was called to assess due to persistent acidosis on ABG. He had been more altered today after getting seroquel last night, but has been awake all day. ABG minimally changed since previous.  Sister, RN, RR RN at bedside during my exam. He is about the same as he was earlier [employer] per his sister. He mostly has had jerky movements and has been not acting like himself today, but has not been unarousable. Was due to HD today, but since he has been on bipap for the afternoon, he cannot get HD.   BP (!) 112/95 (BP Location: Left Arm)   Pulse (!) 105   Temp 97.9 F (36.6 C) (Oral)   Resp 12   Wt 67.8 kg   SpO2 100%   BMI 22.73 kg/m  Sitting up in bed, comfortable on bipap Taken off bipap and placed on 2L [place]- breathing comfortably, saturating well, CTAB.  Chest tube continued bloody output. Some jerky movements, but able to say he is in the hospital, can correctly identify his sister. Complains of thirst, and was able to drink water.   Per sister, not back to baseline, but not worse than earlier and stable, if not improved a little.   D/w Rns and sister present-- stay off bipap to allow him to get HD tonight, 2 amps of bicarb until he is able to get HD. Closely monitor, but since he has been conscious all day, I do not suspect he will become obtunded this evening off bipap. His sister will let RN know if she has new concerns. Planning for HD at midnight in dialysis unit.   Steffanie Dunn, DO 07/26/23 9:43 PM [facility]  For contact information, see Amion. If no response to pager, please call PCCM consult pager. After hours, 7PM- 7AM, please call Elink.

## 2023-07-26 NOTE — Progress Notes (Addendum)
TRH night cross cover note:   STAT VBG ordered to evaluate if patient can come off the BiPAP so that he can pursue HD from PCU as level of care overnight.    Update: Compared to VBG result from prior to initiation of BiPAP (7.18/67/25), the updated VBG showed 7.17/66/23.9. O2 sats continue to be 100%.  I subsequently discussed patient's case with on-call PCCM (Dr. Chestine Spore) who evaluated the patient at bedside along with our rapid response RN. Following this evaluation, the patient has been taken off of BiPAP and transitioned to 2 L [place], and continues to maintain O2 sat of 100%. Additionally, the case was discussed with the HD RN and the updated plan from a dialysis standpoint is for the patient to go to the HD unit for HD overnight (in about 3 hours from now). In the meantime, the patient is receiving 2 amps of bicarb.     Newton Pigg, DO Hospitalist

## 2023-07-26 NOTE — Progress Notes (Signed)
PT Cancellation Note  Patient Details Name: Rickey Erickson MRN: [id number] DOB: 11-29-1955   Cancelled Treatment:    Reason Eval/Treat Not Completed: Medical issues which prohibited therapy. Pt  confused and unable to participate due to reaction to Seroquel. Will continue to follow.    Angelina Ok [facility] 07/26/2023, 10:16 AM Skip Mayer PT Acute [employer] [phone]

## 2023-07-26 NOTE — Progress Notes (Signed)
Patient still with abnormal movements (started 3-4 hours after seroquel last PM) after dose of benadryl.  Now having some work finding difficulty.  Per sister at bedside his baseline is normal.  ? Uremia-- getting HD around 6 tonight ? CO2 retention- will get VBG -will ask neurology to consult formally.  Marlin Canary DO

## 2023-07-26 NOTE — Significant Event (Incomplete)
Rapid Response Event Note   Reason for Call :  Second set of eyes for pt who is on bipap d/t acidosis. For this reason, HD dept cannot accommodate  pt this evening.   Initial Focused Assessment:  Pt lying in bed with eyes open. He is able to answer simple questions by nodding/shaking head. He will follow commands and move all extremities. Lungs clear. He is having some jerky movements.   HR-105, BP-112/95, RR-18, SpO2-100% on .40 bipap.  Interventions:  Bipap> 2L [place] 2 amps bicarb given Plan of Care:  Pt placed on 2L [place] per PCCM MD at bedside. Plan is for pt to go to HD around midnight. 2 amps bicarb given in the interim. Please call RRT if further assistance needed.   Event Summary:   MD Notified: Dr. Arlean Hopping notified by bedside RN, PCCM consulted, Dr. Chestine Spore to bedside. Call Time:2045(while RRT on unit) Arrival Time:2045 End Time:2130  Terrilyn Saver, RN

## 2023-07-26 NOTE — Progress Notes (Signed)
Inpatient Rehab Coordinator Note:  I met with pt and his sister at bedside to discuss CIR recommendations and goals/expectations of CIR stay.  We reviewed 3 hrs/day of therapy, physician follow up, and average length of stay 2 weeks (dependent upon progress) with goals of supervision.  Sister, Clydie Braun, reports reaction to seroquel and increased somnolence and confusion today.  Pt does not wake during our conversation and I did not attempt to wake him up.  Clydie Braun is very interested in CIR and we discussed eventual transition there once medically stable.  We reviewed medicare benefits.  I will follow for admission when cleared by medical team and when bed available.    Estill Dooms, PT, DPT Admissions Coordinator [phone] 07/26/23  11:46 AM

## 2023-07-26 NOTE — Progress Notes (Signed)
[facility] Progress Note   Subjective:   Patient seen and examined at bedside.  Sister reports he did not sleep at all last night.  States he had a bad reaction to Seroquel and was fidgeting all night long.  Recently given benadryl and now resting and able to sleep.    Objective Vitals:   07/26/23 0313 07/26/23 0400 07/26/23 0737 07/26/23 1301  BP: (!) 119/55   (!) 99/47  Pulse: (!) 107  (!) 106 (!) 107  Resp: 15  18 16   Temp: 98.6 F (37 C)   98.2 F (36.8 C)  TempSrc: Oral   Oral  SpO2: 99% 100%  100%  Weight: 67.8 kg      Physical Exam General:chronically ill appearing male in NAD Heart:R+tachycardia Lungs:CTAB, nml WOB on 2L, chest tube in place Abdomen:soft, NTND Extremities:no LE edema Dialysis Access: RU AVF +b/t  Filed Weights   07/24/23 1902 07/25/23 0409 07/26/23 0313  Weight: 66 kg 65.6 kg 67.8 kg    Intake/Output Summary (Last 24 hours) at 07/26/2023 1416 Last data filed at 07/26/2023 0900 Gross per 24 hour  Intake 40 ml  Output 650 ml  Net -610 ml    Additional Objective Labs: Basic Metabolic Panel: Recent Labs  Lab 07/21/23 0919 07/21/23 1246 07/21/23 1302 07/24/23 0503 07/25/23 0454  NA  --  137 137 133* 134*  K  --  5.1 4.3 4.4 4.2  CL  --  105  --  95* 95*  CO2  --  21*  --  21* 26  GLUCOSE  --  68*  --  100* 87  BUN  --  44*  --  75* 29*  CREATININE  --  6.83*  --  8.27* 4.99*  CALCIUM  --  7.7*  --  7.6* 7.4*  PHOS 5.6* 5.2*  --   --  5.6*   Liver Function Tests: Recent Labs  Lab 07/21/23 0445 07/21/23 1246 07/22/23 1559 07/24/23 0503 07/25/23 0454  AST 15  --   --  13*  --   ALT 11  --   --  12  --   ALKPHOS 63  --   --  50  --   BILITOT 0.2*  --   --  0.5  --   PROT 6.8  --  5.9* 5.7*  --   ALBUMIN 3.7 2.8*  --  3.1* 3.0*   Recent Labs  Lab 07/21/23 0919  LIPASE 43  AMYLASE 43   CBC: Recent Labs  Lab 07/21/23 0445 07/21/23 0503 07/21/23 1246 07/21/23 1302 07/24/23 0503 07/25/23 0454  07/26/23 0415 07/26/23 0943  WBC 7.2  --  5.2  --  5.3 4.1 3.9*  --   NEUTROABS 5.3  --   --   --   --   --   --   --   HGB 12.7*   < > 10.6*   < > 10.0* 9.9* 9.3* 8.7*  HCT 45.3   < > 36.7*   < > 34.1* 33.7* 32.3* 30.1*  MCV 100.2*  --  101.4*  --  95.3 98.8 97.6  --   PLT 194  --  153  --  117* 117* 105*  --    < > = values in this interval not displayed.   Blood Culture    Component Value Date/Time   SDES PLEURAL 07/22/2023 1612   SPECREQUEST NONE 07/22/2023 1612   CULT  07/22/2023 1612    NO GROWTH 3  DAYS Performed at [facility], [address] [place], [place] [address]    REPTSTATUS 07/26/2023 FINAL 07/22/2023 1612    CBG: Recent Labs  Lab 07/21/23 1025 07/21/23 1926  GLUCAP 86 92     Studies/Results: DG CHEST PORT 1 VIEW  Result Date: 07/26/2023 CLINICAL DATA:  Chest tube in place. EXAM: PORTABLE CHEST 1 VIEW COMPARISON:  July 25, 2023. FINDINGS: Stable cardiomediastinal silhouette. Stable right-sided chest tube without definite pneumothorax. Bibasilar atelectasis is noted with probable small left pleural effusion. IMPRESSION: Bibasilar atelectasis with small left pleural effusion. Stable position of right-sided chest tube. Electronically Signed   By: Lupita Raider M.D.   On: 07/26/2023 09:53   CT HEAD WO CONTRAST ( )  Result Date: 07/26/2023 CLINICAL DATA:  Delirium EXAM: CT HEAD WITHOUT CONTRAST TECHNIQUE: Contiguous axial images were obtained from the base of the skull through the vertex without intravenous contrast. RADIATION DOSE REDUCTION: This exam was performed according to the departmental dose-optimization program which includes automated exposure control, adjustment of the mA and/or kV according to patient size and/or use of iterative reconstruction technique. COMPARISON:  None Available. FINDINGS: Motion degraded examination. Brain: There is no mass, hemorrhage or extra-axial collection. The size and configuration of the ventricles and  extra-axial CSF spaces are normal. The brain parenchyma is normal, without acute or chronic infarction. Vascular: Atherosclerotic calcification of the internal carotid arteries at the skull base. No abnormal hyperdensity of the major intracranial arteries or dural venous sinuses. Skull: The visualized skull base, calvarium and extracranial soft tissues are normal. Sinuses/Orbits: No fluid levels or advanced mucosal thickening of the visualized paranasal sinuses. No mastoid or middle ear effusion. The orbits are normal. IMPRESSION: Motion degraded examination without acute intracranial abnormality. Electronically Signed   By: Deatra Robinson M.D.   On: 07/26/2023 01:43   DG Chest Port 1 View  Result Date: 07/25/2023 CLINICAL DATA:  Chest tube in place. EXAM: PORTABLE CHEST 1 VIEW COMPARISON:  July 24, 2023. FINDINGS: Stable cardiomediastinal silhouette. Right-sided chest tube is unchanged in position. No pneumothorax is noted. Small left pleural effusion is noted with associated atelectasis. Mildly increased right basilar atelectasis is noted. Sternotomy wires are noted. IMPRESSION: Stable right-sided chest tube without pneumothorax. Mildly increased right basilar atelectasis. Stable small left pleural effusion with associated atelectasis. Electronically Signed   By: Lupita Raider M.D.   On: 07/25/2023 13:22    Medications:  sodium chloride     albumin human     azithromycin 500 mg (07/26/23 0540)   cefTRIAXone (ROCEPHIN)  IV 2 g (07/26/23 0504)    apixaban  2.5 mg Oral BID   arformoterol  15 mcg Nebulization BID   atorvastatin  20 mg Oral q1800   Chlorhexidine Gluconate Cloth  6 each Topical Q0600   clopidogrel  75 mg Oral q morning   feeding supplement  237 mL Oral BID BM   mirtazapine  30 mg Oral QHS   mycophenolate  180 mg Oral BID   pantoprazole  40 mg Oral Daily   revefenacin  175 mcg Nebulization Daily   senna  2 tablet Oral BID   sodium chloride flush  10 mL Intrapleural Q8H    sodium chloride flush  3 mL Intravenous Q12H   tacrolimus  6 mg Oral BID    Dialysis Orders: [facility], MWF, 4 hours,  EDW 60 kg 2K 3ca RUE HeRo Graft Mircera 120 mcg on 9/18   Assessment/Plan: 1. Acute hypoxic/hypercapnic respiratory failure w/respiratory acidosis/pleural effusion -  chest tube placed on 9/28. tPA instilled by PCCM on 9/29, and 10/1. S/p HD on 9/27 w/2.9L removed, and yesterday with 1.84L removed. Breathing better, on 2L O2. May go to CIR.  2. B/l PNA - on Vanc 3. Medication reaction - reports agitation and insomnia w/Seroquel use last night.  Has been d/c. 4. ESRD - on HD MWF.  HD today per regular schedule.   5. Anemia of CKD- Hgb 10. ESA due today - ordered. 6. Secondary hyperparathyroidism - CCa ok. Phos in goal. Not on binders or VDRA. Monitor labs.  7. HTN/volume - Blood pressure in goal. On midodrine pre HD for hypotension.  Not close to EDW if weight correct - doubt accuracy of bed weights.  Does not appear volume overloaded. UF as tolerated.  8. Nutrition - On regular diet. Monitor labs.  9. Persistent Tachycardia - per PMD 10. Hx heart transplant at [facility] discussed w/TXP team  Virgina Norfolk, PA-C [facility] 07/26/2023,2:16 PM  LOS: 5 days

## 2023-07-26 NOTE — Progress Notes (Addendum)
NAME:  Rickey Erickson, MRN:  [id number], DOB:  1956/03/21, LOS: 5 ADMISSION DATE:  07/21/2023, CONSULTATION DATE: 07/21/2023 REFERRING MD: Emergency department, CHIEF COMPLAINT: Somnolence cardiac respiratory failure  History of Present Illness:  67 year old male status post heart transplant 2021 following a massive myocardial infarction.  Patient had multiple thoracentesis over the last 2 years and presents with 10 days of somnolence noted to be hypercarbic in the ER placed on BiPAP with improvement.  He gets dialysis Monday Wednesdays and Fridays he is due for treatment today.  He usually receives his care at [facility] but we have been notified that they are not accepting patients due to lack of beds.  He was admitted and ultimately required chest tube insertion with output. Fluid studies consistent with exudative effusion likely from his current bilateral pneumonia. He was given pleural lytic therapy with good response.  On 2LNC. Wife at bedside. Received Seroquel last night and altered this morning. To receive dialysis today   Pertinent  Medical History   Past Medical History:  Diagnosis Date   Anemia    Heart failure (HCC)    Hypertension    Osteoporosis    Renal disorder      Significant Hospital Events: Including procedures, antibiotic start and stop dates in addition to other pertinent events   9/28: pigtail chest tube placed  9/29: lytics given  10/1: repeated lytics given.  10/2: 650cc output from CT yesterday p lytics. CT today to better evaluate effusion.   Interim History / Subjective:  Received Seroquel last night and is altered this morning. Not participating in exam. Does not appear to be in any distress however. On 2LNC. Wife is at bedside.   Objective   Blood pressure (!) 119/55, pulse (!) 106, temperature 98.6 F (37 C), temperature source Oral, resp. rate 18, weight 67.8 kg, SpO2 100%.        Intake/Output Summary (Last 24 hours) at 07/26/2023  0827 Last data filed at 07/26/2023 7829 Gross per 24 hour  Intake 290 ml  Output 650 ml  Net -360 ml   Filed Weights   07/24/23 1902 07/25/23 0409 07/26/23 0313  Weight: 66 kg 65.6 kg 67.8 kg    Examination: Physical exam General: male sitting in bed, no acute distress, confused  HEENT: [place]/AT, eyes anicteric.  moist mucus membranes Neuro: awake, confused  Chest: decreased lung sound to RLL, without rhonchi or rales  Right-sided chest tube in place, draining bloody fluid Heart: Regular rate and rhythm, no murmurs or gallops Abdomen: Soft, nontender, nondistended, bowel sounds present Skin: No rash Resolved Hospital Problem list     Assessment & Plan:  Acute hypoxic/hypercarbic respiratory failure  Bilateral lower lobe pneumonia Complex right-sided pleural effusion with septations status post multiple thoracentesis Pleural fluid studies are consistent with exudative effusion. S/p lytics on 9/29 & 10/1 Oxygen requirement stable  - CT today to better evaluate effusion. Will make decision about further lytics or CT removal after CT chest.  - check hgb given bloody output  - CXR tomorrow morning  - monitor CT output, continue to -20cmH20 - con't rocephin, azithro for CAP treatment; MRSA PCR negative, consider dc vancomycin  - pulmonary toileting   Rest of the management per primary team, PCCM will follow along for chest tube management  Best Practice (right click and "Reselect all SmartList Selections" daily)  Per primary team  Labs   CBC: Recent Labs  Lab 07/21/23 0445 07/21/23 0503 07/21/23 1246 07/21/23 1302 07/24/23 0503  07/25/23 0454 07/26/23 0415  WBC 7.2  --  5.2  --  5.3 4.1 3.9*  NEUTROABS 5.3  --   --   --   --   --   --   HGB 12.7*   < > 10.6* 12.6* 10.0* 9.9* 9.3*  HCT 45.3   < > 36.7* 37.0* 34.1* 33.7* 32.3*  MCV 100.2*  --  101.4*  --  95.3 98.8 97.6  PLT 194  --  153  --  117* 117* 105*   < > = values in this interval not displayed.    Basic  Metabolic Panel: Recent Labs  Lab 07/21/23 0445 07/21/23 0503 07/21/23 0705 07/21/23 0919 07/21/23 1246 07/21/23 1302 07/24/23 0503 07/25/23 0454  NA 135   < > 131*  --  137 137 133* 134*  K 5.6*   < > 5.1  --  5.1 4.3 4.4 4.2  CL 98  --   --   --  105  --  95* 95*  CO2 22  --   --   --  21*  --  21* 26  GLUCOSE 142*  --   --   --  68*  --  100* 87  BUN 44*  --   --   --  44*  --  75* 29*  CREATININE 6.73*  --   --   --  6.83*  --  8.27* 4.99*  CALCIUM 8.3*  --   --   --  7.7*  --  7.6* 7.4*  MG  --   --   --  2.0  --   --  2.1  --   PHOS  --   --   --  5.6* 5.2*  --   --  5.6*   < > = values in this interval not displayed.   GFR: Estimated Creatinine Clearance: 14 mL/min (A) (by C-G formula based on SCr of 4.99 mg/dL (H)). Recent Labs  Lab 07/21/23 0504 07/21/23 0631 07/21/23 0919 07/21/23 1246 07/24/23 0503 07/25/23 0454 07/26/23 0415  PROCALCITON  --   --  0.31  --   --   --   --   WBC  --   --   --  5.2 5.3 4.1 3.9*  LATICACIDVEN 1.0 1.0  --   --   --   --   --     Liver Function Tests: Recent Labs  Lab 07/21/23 0445 07/21/23 1246 07/22/23 1559 07/24/23 0503 07/25/23 0454  AST 15  --   --  13*  --   ALT 11  --   --  12  --   ALKPHOS 63  --   --  50  --   BILITOT 0.2*  --   --  0.5  --   PROT 6.8  --  5.9* 5.7*  --   ALBUMIN 3.7 2.8*  --  3.1* 3.0*   Recent Labs  Lab 07/21/23 0919  LIPASE 43  AMYLASE 43   Recent Labs  Lab 07/21/23 0622  AMMONIA 57*    ABG    Component Value Date/Time   PHART 7.227 (L) 07/21/2023 1302   PCO2ART 56.0 (H) 07/21/2023 1302   PO2ART 83 07/21/2023 1302   HCO3 23.3 07/21/2023 1302   TCO2 25 07/21/2023 1302   ACIDBASEDEF 5.0 (H) 07/21/2023 1302   O2SAT 93 07/21/2023 1302     Coagulation Profile: Recent Labs  Lab 07/21/23 0445  INR 1.0    Cardiac  Enzymes: No results for input(s): "CKTOTAL", "CKMB", "CKMBINDEX", "TROPONINI" in the last 168 hours.  HbA1C: Hgb A1c MFr Bld  Date/Time Value Ref Range  Status  01/06/2022 08:09 AM 4.9 4.8 - 5.6 % Final    Comment:    (NOTE)         Prediabetes: 5.7 - 6.4         Diabetes: >6.4         Glycemic control for adults with diabetes: <7.0     CBG: Recent Labs  Lab 07/21/23 1025 07/21/23 1926  GLUCAP 86 92     Karma Greaser [facility] 07/26/2023, 9:37 AM  Please see [email or web address] for pager details.  From 7A-7P if no response, please call [phone]. After hours, please call ELink [phone].     Patient seen, independently examined, care plan was formulated and discussed with Mertha Baars as per documentation  Fluid effluent noted to be bloody CT scan ordered for today Spouse at bedside states he is a little bit out of bed today because of Seroquel that he was given last evening  Stable hemodynamics Moist oral mucosa Decreased air movement at the bases bilaterally S1-S2 appreciated  bowel sounds appreciated  I reviewed nursing notes, hospitalist notes, last 24 h vitals and pain scores, last 48 h intake and output, last 24 h labs and trends, and last 24 h imaging results.  Assessment/plan Loculated pleural effusion S/p lytics x 2 Fluid effluent is bloody at present -Drained over 600 cc in the last 24 hours  Will obtain CT scan of the chest to assess underlying lung and to ascertain that fluid is adequately drained prior to planning for discontinuing tube  Virl Diamond, MD [facility] Pager: See Loretha Stapler

## 2023-07-26 NOTE — Progress Notes (Signed)
Informed by RN that patient received Seroquel tonight and family does not want him to receive this medication anymore.  I have discontinued order for Seroquel.

## 2023-07-26 NOTE — Progress Notes (Addendum)
Rickey Erickson  UJW:[id number] DOB: [birth year] DOA: 07/21/2023 PCP: Carin Hock, MD (Inactive)     Brief Narrative:  67 year old with a history of heart transplant in 2021 following a devastating MI, ESRD on HD, HTN, and recurrent pleural effusions requiring drainage who presented to the hospital 9/27 with a 10-day history of progressive somnolence and shortness of breath.  In the ER he was found to be in acute hypercarbic respiratory failure and required initiation of BiPAP.  CXR noted a significant right pleural effusion.  He had most recently undergone a thoracentesis at [facility] on 9/19 with 700 cc of fluid produced. Significant Events:  9/27 admit via PCCM  9/28 pigtail chest tube placed by PCCM  9/29 care assumed by [facility]   Goals of Care:   Code Status: Full Code   DVT prophylaxis: apixaban (ELIQUIS) tablet 2.5 mg Start: 07/23/23 1000 apixaban (ELIQUIS) tablet 2.5 mg   Interim Hx: Got Seroquel over night and since then has been altered with repetitive movements  Assessment & Plan:  Acute hypoxic and hypercarbic respiratory failure - resolved Required BiPAP at initial presentation with symptoms of severe dyspnea -now successfully weaned to minimal O2 support at 1-2 L nasal cannula  Bilateral lower lobe pneumonia with septic shock Required vasopressor support with Levophed during initial hospital stay-blood pressure now stable without support -afebrile -WBC normalized -tolerating empiric antibiotic without difficulty  Complex right pleural effusion with septations -history of requiring multiple prior thoracentesis procedures -status post placement of pigtail chest tube 9/28 by critical care -ongoing tube and effusion management per critical care -pleural fluid studies consistent with exudate per Light's criteria -culture negative thus far -tPA instilled in tube 9/29 and again 10/1 -d/c vanc -CT pending 10/2  Acute metabolic encephalopathy due to hypercarbia -recurrent  confusion Initial encephalopathy felt to be due to respiratory failure with hypercarbia and resolved with clinical improvement and utilization of BiPAP -recurrent confusion likely simply low-grade sundowning -no focal neurologic deficits to suggest CVA - B12 and folic acid levels not low  -CT head unrevealing  Persisting sinus tachycardia Has been consistent pretty much since admission -is not on chronic beta-blocker therapy as patient has difficulty with hypotension during dialysis requiring midodrine -cortisol normal during this hospital stay - TSH normal -is on chronic Eliquis therapy making PE much less likely - monitor   Status post heart transplant Continue usual immunosuppressive therapy with Prograf - PCCM has been in touch with the transplant team at [facility]  ESRD on HD MWF Nephrology attending to ongoing dialysis  Anemia of chronic kidney disease Erythropoietin per Nephrology team  Possible akathisia -d/c seroquel -trial of benadryl IV after discussion with neurology -will consult neuro if not better    Family Communication: sister at bedside Disposition: Anticipate eventual discharge home   Objective: Blood pressure (!) 119/55, pulse (!) 106, temperature 98.6 F (37 C), temperature source Oral, resp. rate 18, weight 67.8 kg, SpO2 100%.  Intake/Output Summary (Last 24 hours) at 07/26/2023 1012 Last data filed at 07/26/2023 0651 Gross per 24 hour  Intake 40 ml  Output 650 ml  Net -610 ml   Filed Weights   07/24/23 1902 07/25/23 0409 07/26/23 0313  Weight: 66 kg 65.6 kg 67.8 kg    Examination:  General: Appearance:    Elderly male     Lungs:      respirations unlabored  Heart:    Tachycardic. Normal rhythm. No murmurs, rubs, or gallops.   MS:   All extremities are intact.  Neurologic:   Awake, alert, uncontrollable movement in torso-- sliding side to side     CBC: Recent Labs  Lab 07/21/23 0445 07/21/23 0503 07/24/23 0503 07/25/23 0454 07/26/23 0415   WBC 7.2   < > 5.3 4.1 3.9*  NEUTROABS 5.3  --   --   --   --   HGB 12.7*   < > 10.0* 9.9* 9.3*  HCT 45.3   < > 34.1* 33.7* 32.3*  MCV 100.2*   < > 95.3 98.8 97.6  PLT 194   < > 117* 117* 105*   < > = values in this interval not displayed.   Basic Metabolic Panel: Recent Labs  Lab 07/21/23 0919 07/21/23 1246 07/21/23 1302 07/24/23 0503 07/25/23 0454  NA  --  137 137 133* 134*  K  --  5.1 4.3 4.4 4.2  CL  --  105  --  95* 95*  CO2  --  21*  --  21* 26  GLUCOSE  --  68*  --  100* 87  BUN  --  44*  --  75* 29*  CREATININE  --  6.83*  --  8.27* 4.99*  CALCIUM  --  7.7*  --  7.6* 7.4*  MG 2.0  --   --  2.1  --   PHOS 5.6* 5.2*  --   --  5.6*   GFR: Estimated Creatinine Clearance: 14 mL/min (A) (by C-G formula based on SCr of 4.99 mg/dL (H)).   Scheduled Meds:  apixaban  2.5 mg Oral BID   arformoterol  15 mcg Nebulization BID   atorvastatin  20 mg Oral q1800   Chlorhexidine Gluconate Cloth  6 each Topical Q0600   clopidogrel  75 mg Oral q morning   diphenhydrAMINE  50 mg Intravenous Once   feeding supplement  237 mL Oral BID BM   mirtazapine  30 mg Oral QHS   mycophenolate  180 mg Oral BID   pantoprazole  40 mg Oral Daily   revefenacin  175 mcg Nebulization Daily   senna  2 tablet Oral BID   sodium chloride flush  10 mL Intrapleural Q8H   sodium chloride flush  3 mL Intravenous Q12H   tacrolimus  6 mg Oral BID   Continuous Infusions:  sodium chloride     albumin human     azithromycin 500 mg (07/26/23 0540)   cefTRIAXone (ROCEPHIN)  IV 2 g (07/26/23 0504)   vancomycin Stopped (07/24/23 2106)     LOS: 5 days   Marlin Canary DO  If 7PM-7AM, please contact night-coverage per Amion 07/26/2023, 10:12 AM

## 2023-07-27 ENCOUNTER — Inpatient Hospital Stay (HOSPITAL_COMMUNITY): Payer: Medicare Other

## 2023-07-27 ENCOUNTER — Ambulatory Visit: Admit: 2023-07-27 | Payer: MEDICARE

## 2023-07-27 ENCOUNTER — Ambulatory Visit
Admit: 2023-07-27 | Discharge: 2023-08-17 | Disposition: A | Discharge status: Home / Self Care | Payer: MEDICARE | Source: Other Acute Inpatient Hospital

## 2023-07-27 ENCOUNTER — Ambulatory Visit: Admit: 2023-07-27 | Discharge: 2023-08-17 | Payer: MEDICARE

## 2023-07-27 ENCOUNTER — Encounter: Admit: 2023-07-27 | Payer: MEDICARE

## 2023-07-27 DIAGNOSIS — Z949 Transplanted organ and tissue status, unspecified: Secondary | ICD-10-CM | POA: Diagnosis not present

## 2023-07-27 DIAGNOSIS — J9602 Acute respiratory failure with hypercapnia: Secondary | ICD-10-CM | POA: Diagnosis not present

## 2023-07-27 LAB — CBC
HCT: 31 % — ABNORMAL LOW (ref 39.0–52.0)
HCT: 34.6 % — ABNORMAL LOW (ref 39.0–52.0)
Hemoglobin: 9 g/dL — ABNORMAL LOW (ref 13.0–17.0)
Hemoglobin: 9.9 g/dL — ABNORMAL LOW (ref 13.0–17.0)
MCH: 29.2 pg (ref 26.0–34.0)
MCH: 28.2 pg (ref 26.0–34.0)
MCHC: 29 g/dL — ABNORMAL LOW (ref 30.0–36.0)
MCHC: 28.6 g/dL — ABNORMAL LOW (ref 30.0–36.0)
MCV: 100.6 fL — ABNORMAL HIGH (ref 80.0–100.0)
MCV: 98.6 fL (ref 80.0–100.0)
Platelets: 112 10*3/uL — ABNORMAL LOW (ref 150–400)
Platelets: 147 10*3/uL — ABNORMAL LOW (ref 150–400)
RBC: 3.08 MIL/uL — ABNORMAL LOW (ref 4.22–5.81)
RBC: 3.51 MIL/uL — ABNORMAL LOW (ref 4.22–5.81)
RDW: 14.6 % (ref 11.5–15.5)
RDW: 14.6 % (ref 11.5–15.5)
WBC: 3.9 10*3/uL — ABNORMAL LOW (ref 4.0–10.5)
WBC: 4.9 10*3/uL (ref 4.0–10.5)
nRBC: 0 % (ref 0.0–0.2)
nRBC: 0 % (ref 0.0–0.2)

## 2023-07-27 LAB — CULTURE, BLOOD (ROUTINE X 2): Culture  Setup Time: NO GROWTH

## 2023-07-27 LAB — BASIC METABOLIC PANEL
Anion gap: 14 (ref 5–15)
BUN: 22 mg/dL (ref 8–23)
CO2: 25 mmol/L (ref 22–32)
Calcium: 7 mg/dL — ABNORMAL LOW (ref 8.9–10.3)
Chloride: 94 mmol/L — ABNORMAL LOW (ref 98–111)
Creatinine, Ser: 3.92 mg/dL — ABNORMAL HIGH (ref 0.61–1.24)
GFR, Estimated: 16 mL/min — ABNORMAL LOW (ref >60)
Glucose, Bld: 65 mg/dL — ABNORMAL LOW (ref 70–99)
Potassium: 4.3 mmol/L (ref 3.5–5.1)
Sodium: 133 mmol/L — ABNORMAL LOW (ref 135–145)

## 2023-07-27 LAB — GLUCOSE, CAPILLARY: Glucose-Capillary: 80 mg/dL (ref 70–99)

## 2023-07-27 MED ORDER — DARBEPOETIN ALFA 150 MCG/0.3ML IJ SOSY: 150.0000 ug | PREFILLED_SYRINGE | INTRAMUSCULAR | Status: AC

## 2023-07-27 MED ORDER — ARFORMOTEROL TARTRATE 15 MCG/2ML IN NEBU: 15.0000 ug | INHALATION_SOLUTION | Freq: Two times a day (BID) | RESPIRATORY_TRACT | Status: DC

## 2023-07-27 MED ORDER — REVEFENACIN 175 MCG/3ML IN SOLN: 175.0000 ug | Freq: Every day | RESPIRATORY_TRACT | Status: DC

## 2023-07-27 MED ORDER — ALBUMIN HUMAN 25 % IV SOLN: 25.0000 g | INTRAVENOUS | Status: AC | PRN

## 2023-07-27 NOTE — Progress Notes (Signed)
Contacted [facility] to advise clinic of pt's transfer to [facility] today.    Olivia Canter Renal Navigator [phone]

## 2023-07-27 NOTE — Progress Notes (Signed)
[facility] Progress Note   Subjective:   Patient seen and examined in room.  Initially seen this AM and very lethargic with abnormal movements of head, now close to baseline with no abnormal movements and able to answer questions appropriately.  Required BiPAP yesterday afternoon and again earlier today. CT yesterday showed moderate size pleural effusion on R and CXR today showed it increasing in size.  BC+ cutibacterium acnes.  Patient and sister have opted to transfer to [facility] to be under the care of his heart transplant team.  Waiting for transport.   Objective Vitals:   07/27/23 0703 07/27/23 0751 07/27/23 1348 07/27/23 1417  BP: 120/70     Pulse: (!) 102 (!) 104 (!) 106   Resp: 18 19 (!) 21 14  Temp: 97.7 F (36.5 C)   98 F (36.7 C)  TempSrc: Axillary   Oral  SpO2: 100%  100%   Weight:       Physical Exam General:chronically ill appearing male in NAD Heart: +tachycardia Lungs:chest tube in place on R, breath sounds decreased, +clear anteriorly on L  Abdomen:soft, NTND Extremities:no LE edema Dialysis Access: RU AVF +b/t   Filed Weights   07/24/23 1902 07/25/23 0409 07/26/23 0313  Weight: 66 kg 65.6 kg 67.8 kg    Intake/Output Summary (Last 24 hours) at 07/27/2023 1522 Last data filed at 07/27/2023 0900 Gross per 24 hour  Intake 1083.23 ml  Output 100 ml  Net 983.23 ml    Additional Objective Labs: Basic Metabolic Panel: Recent Labs  Lab 07/21/23 1246 07/21/23 1302 07/25/23 0454 07/26/23 1915 07/27/23 0927  NA 137   < > 134* 132* 133*  K 5.1   < > 4.2 4.5 4.3  CL 105   < > 95* 94* 94*  CO2 21*   < > 26 21* 25  GLUCOSE 68*   < > 87 80 65*  BUN 44*   < > 29* 57* 22  CREATININE 6.83*   < > 4.99* 8.16* 3.92*  CALCIUM 7.7*   < > 7.4* 7.0* 7.0*  PHOS 5.2*  --  5.6* 8.7*  --    < > = values in this interval not displayed.   Liver Function Tests: Recent Labs  Lab 07/21/23 0445 07/21/23 1246 07/22/23 1559 07/24/23 0503 07/25/23 0454  07/26/23 1915  AST 15  --   --  13*  --   --   ALT 11  --   --  12  --   --   ALKPHOS 63  --   --  50  --   --   BILITOT 0.2*  --   --  0.5  --   --   PROT 6.8  --  5.9* 5.7*  --   --   ALBUMIN 3.7   < >  --  3.1* 3.0* 2.8*   < > = values in this interval not displayed.   Recent Labs  Lab 07/21/23 0919  LIPASE 43  AMYLASE 43   CBC: Recent Labs  Lab 07/21/23 0445 07/21/23 0503 07/25/23 0454 07/26/23 0415 07/26/23 0943 07/26/23 1915 07/27/23 0927 07/27/23 1309  WBC 7.2   < > 4.1 3.9*  --  4.3 3.9* 4.9  NEUTROABS 5.3  --   --   --   --   --   --   --   HGB 12.7*   < > 9.9* 9.3*   < > 8.4* 9.0* 9.9*  HCT 45.3   < >  33.7* 32.3*   < > 28.9* 31.0* 34.6*  MCV 100.2*   < > 98.8 97.6  --  99.7 100.6* 98.6  PLT 194   < > 117* 105*  --  106* 112* 147*   < > = values in this interval not displayed.   Blood Culture    Component Value Date/Time   SDES PLEURAL 07/22/2023 1612   SPECREQUEST NONE 07/22/2023 1612   CULT  07/22/2023 1612    NO GROWTH 3 DAYS Performed at [facility], [address], [place], [place] [address]    REPTSTATUS 07/26/2023 FINAL 07/22/2023 1612    CBG: Recent Labs  Lab 07/21/23 1025 07/21/23 1926 07/27/23 1413  GLUCAP 86 92 80    Studies/Results: DG CHEST PORT 1 VIEW  Result Date: 07/27/2023 CLINICAL DATA:  Pleural effusion. EXAM: PORTABLE CHEST 1 VIEW COMPARISON:  Radiographs 07/26/2023 and 07/25/2023.  CT 07/26/2023. FINDINGS: 0834 hours. Unchanged position of right pleural drainage catheter posteromedially in the right hemithorax. This catheter may be kinked near the skin insertion. The right pleural effusion appears slightly larger. A small left pleural effusion and mild bibasilar atelectasis are unchanged. There is no pneumothorax. The heart size and mediastinal contours are stable post median sternotomy. Right IJ hemodialysis catheter appears unchanged, extending to the level of the lower right atrium. IMPRESSION: 1. Slight increase in  right pleural effusion. No pneumothorax. 2. Unchanged position of right pleural drainage catheter. The catheter may be kinked near the skin insertion. Electronically Signed   By: Carey Bullocks M.D.   On: 07/27/2023 12:40   CT CHEST WO CONTRAST  Result Date: 07/26/2023 CLINICAL DATA:  Pleural effusion, malignancy suspected EXAM: CT CHEST WITHOUT CONTRAST TECHNIQUE: Multidetector CT imaging of the chest was performed following the standard protocol without IV contrast. RADIATION DOSE REDUCTION: This exam was performed according to the departmental dose-optimization program which includes automated exposure control, adjustment of the mA and/or kV according to patient size and/or use of iterative reconstruction technique. COMPARISON:  Chest x-ray today FINDINGS: Cardiovascular: Heart is normal size. Aorta is normal caliber. Coronary artery and aortic atherosclerosis. Prior CABG. Dialysis catheter tip in the right atrium. Mediastinum/Nodes: No mediastinal, hilar, or axillary adenopathy. Trachea and esophagus are unremarkable. Thyroid unremarkable. Lungs/Pleura: Moderate right pleural effusion and trace left pleural effusion. Right posterior chest tube in place. Bilateral lower lobe airspace opacities, right greater than left. This could reflect atelectasis or pneumonia. Predominantly linear opacities in the lingula are stable compatible with scarring. Upper Abdomen: No acute findings. Bilateral nephrolithiasis. No hydronephrosis. Musculoskeletal: Chest wall soft tissues are unremarkable. No acute bony abnormality. IMPRESSION: Moderate right pleural effusion and trace left pleural effusion. Bilateral lower lobe airspace opacities could reflect atelectasis or pneumonia. Right posterior chest tube in place.  No pneumothorax. Scarring in the lingula. Bilateral nephrolithiasis.  No hydronephrosis. Aortic Atherosclerosis (ICD10-I70.0). Electronically Signed   By: Charlett Nose M.D.   On: 07/26/2023 17:24   DG CHEST PORT  1 VIEW  Result Date: 07/26/2023 CLINICAL DATA:  Chest tube in place. EXAM: PORTABLE CHEST 1 VIEW COMPARISON:  July 25, 2023. FINDINGS: Stable cardiomediastinal silhouette. Stable right-sided chest tube without definite pneumothorax. Bibasilar atelectasis is noted with probable small left pleural effusion. IMPRESSION: Bibasilar atelectasis with small left pleural effusion. Stable position of right-sided chest tube. Electronically Signed   By: Lupita Raider M.D.   On: 07/26/2023 09:53   CT HEAD WO CONTRAST ( )  Result Date: 07/26/2023 CLINICAL DATA:  Delirium EXAM: CT HEAD  WITHOUT CONTRAST TECHNIQUE: Contiguous axial images were obtained from the base of the skull through the vertex without intravenous contrast. RADIATION DOSE REDUCTION: This exam was performed according to the departmental dose-optimization program which includes automated exposure control, adjustment of the mA and/or kV according to patient size and/or use of iterative reconstruction technique. COMPARISON:  None Available. FINDINGS: Motion degraded examination. Brain: There is no mass, hemorrhage or extra-axial collection. The size and configuration of the ventricles and extra-axial CSF spaces are normal. The brain parenchyma is normal, without acute or chronic infarction. Vascular: Atherosclerotic calcification of the internal carotid arteries at the skull base. No abnormal hyperdensity of the major intracranial arteries or dural venous sinuses. Skull: The visualized skull base, calvarium and extracranial soft tissues are normal. Sinuses/Orbits: No fluid levels or advanced mucosal thickening of the visualized paranasal sinuses. No mastoid or middle ear effusion. The orbits are normal. IMPRESSION: Motion degraded examination without acute intracranial abnormality. Electronically Signed   By: Deatra Robinson M.D.   On: 07/26/2023 01:43    Medications:  sodium chloride     albumin human      apixaban  2.5 mg Oral BID   arformoterol   15 mcg Nebulization BID   atorvastatin  20 mg Oral q1800   Chlorhexidine Gluconate Cloth  6 each Topical Q0600   clopidogrel  75 mg Oral q morning   darbepoetin (ARANESP) injection - NON-DIALYSIS  150 mcg Subcutaneous Q Wed-1800   feeding supplement  237 mL Oral BID BM   mirtazapine  30 mg Oral QHS   mycophenolate  180 mg Oral BID   pantoprazole  40 mg Oral Daily   revefenacin  175 mcg Nebulization Daily   senna  2 tablet Oral BID   sodium chloride flush  10 mL Intrapleural Q8H   sodium chloride flush  3 mL Intravenous Q12H   tacrolimus  6 mg Oral BID    Dialysis Orders: [facility], MWF, 4 hours, EDW 60 kg  2K 3ca RUE HeRo Graft Mircera 120 mcg on 9/18   Assessment/Plan: 1. Acute hypoxic/hypercapnic respiratory failure w/respiratory acidosis/pleural effusion - chest tube placed on 9/28. tPA instilled by PCCM on 9/29, and 10/1. S/p HD on 9/27 w/2.9L removed, and yesterday with 1.84L removed. Breathing better, on 2L O2. CT showed moderate pleural effusion on R, and CXR today showed increasing in size.   2. Bacteremia - BC+ cutibacterium acnes as of today.  Management per [facility] 3. B/l PNA - on Vanc 4. Medication reaction - reports agitation and insomnia w/Seroquel use. Has been d/c. Better today. Neruo consulted.  5. ESRD - on HD MWF.  HD early AM this morning.  Should have again tomorrow per regular schedule.   6. Anemia of CKD- Hgb 10. ESA due today - ordered. 7. Secondary hyperparathyroidism - CCa ok. Phos in goal. Not on binders or VDRA. Monitor labs.  8. HTN/volume - Blood pressure in goal. On midodrine pre HD for hypotension.  Not close to EDW if weight correct - doubt accuracy of bed weights.  Does not appear volume overloaded. UF as tolerated.  9. Nutrition - On regular diet. Monitor labs.  10. Persistent Tachycardia - per PMD 11. Hx heart transplant at [facility] discussed w/TXP team 12. Dispo - plan to transfer to [facility] for ongoing care  Virgina Norfolk, PA-C [facility] 07/27/2023,3:22 PM  LOS: 6 days

## 2023-07-27 NOTE — Progress Notes (Addendum)
   07/27/23 0534  Vitals  Temp (!) 97.5 F (36.4 C)  Temp Source Axillary  BP (!) 105/51  MAP (mmHg) 65  BP Location Left Arm  BP Method Automatic  Patient Position (if appropriate) Lying  Pulse Rate (!) 104  Pulse Rate Source Monitor  ECG Heart Rate 100  Resp 20  Oxygen Therapy  SpO2 100 %  O2 Device Nasal Cannula  O2 Flow Rate (L/min) 3 L/min  Patient Activity (if Appropriate) In bed  Pulse Oximetry Type Continuous  During Treatment Monitoring  Blood Flow Rate (mL/min) 250 mL/min  Arterial Pressure (mmHg) -90 mmHg  Venous Pressure (mmHg) 230 mmHg  HD Safety Checks Performed Yes  Intra-Hemodialysis Comments Tx completed  Post Treatment  Dialyzer Clearance Lightly streaked  Liters Processed 58.3  Fluid Removed (mL) 0 mL  Tolerated HD Treatment Yes  Post-Hemodialysis Comments  (UF goal decrease to 0 to support blood pressure.)  AVG/AVF Arterial Site Held (minutes) 10 minutes  AVG/AVF Venous Site Held (minutes) 10 minutes  Fistula / Graft Right Upper arm  No placement date or time found.   Orientation: Right  Access Location: Upper arm  Site Condition No complications  Fistula / Graft Assessment Thrill  Status Deaccessed  Needle Size 15  Drainage Description None   (0 UF), no fluid removal due to low blood pressure at start. Pt stable post treatment.Hand off to [employer] fund[employer].

## 2023-07-27 NOTE — TOC Transition Note (Signed)
Transition of Care [facility]) - CM/SW Discharge Note   Patient Details  Name: Rickey Erickson MRN: [id number] Date of Birth: 01/19/1956  [facility]) CM/SW Contact:  Gala Lewandowsky, RN Phone Number: 07/27/2023, 2:13 PM   Clinical Narrative: Case Manager received notification that the patient would be transferred to [facility] ICU. [facility] to provide transportation. Information on the shadow chart for transport. No further needs identified at this time.   Final next level of care: Acute to Acute Transfer Barriers to Discharge: No Barriers Identified   Patient Goals and CMS Choice   Choice offered to / list presented to : Patient, Sibling (patient and sister)   Discharge Plan and Services Additional resources added to the After Visit Summary for   In-house Referral: Clinical Social Work Discharge Planning Services: CM Consult    Social Determinants of Health (SDOH) Interventions SDOH Screenings   Food Insecurity: No Food Insecurity (07/22/2023)  Housing: Low Risk  (07/22/2023)  Transportation Needs: No Transportation Needs (07/22/2023)  Utilities: Not At Risk (07/22/2023)  Depression (PHQ2-9): Low Risk  (04/24/2023)  Financial Resource Strain: Low Risk  (03/09/2023)   Received from [facility], [facility]  Social Connections: Unknown (05/08/2018)   Received from [facility], [facility]  Stress: No Stress Concern Present (05/08/2018)   Received from [facility], [facility]  Tobacco Use: Low Risk  (07/22/2023)  Health Literacy: Medium Risk (01/28/2021)   Received from [facility], [facility]   Readmission Risk Interventions     No data to display

## 2023-07-27 NOTE — Progress Notes (Signed)
Rickey Erickson  ZOX:[id number] DOB: December 12, 1955 DOA: 07/21/2023 PCP: Carin Hock, MD (Inactive)     Brief Narrative:  67 year old with a history of heart transplant in 2021 following a devastating MI, ESRD on HD, HTN, and recurrent pleural effusions requiring drainage who presented to the hospital 9/27 with a 10-day history of progressive somnolence and shortness of breath.  In the ER he was found to be in acute hypercarbic respiratory failure and required initiation of BiPAP.  CXR noted a significant right pleural effusion.  He had most recently undergone a thoracentesis at [facility] on 9/19 with 700 cc of fluid produced. Significant Events:  9/27 admit via PCCM  9/28 pigtail chest tube placed by PCCM  9/29 care assumed by TRH 10/1- got Seroquel and had reaction-  Goals of Care:   Code Status: Full Code   DVT prophylaxis: apixaban (ELIQUIS) tablet 2.5 mg Start: 07/23/23 1000 apixaban (ELIQUIS) tablet 2.5 mg   Interim Hx: Had HD this AM-- movement from torso now is in neck/head  Assessment & Plan:  Acute hypoxic and hypercarbic respiratory failure - resolved Required BiPAP at initial presentation with symptoms of severe dyspnea -now successfully weaned to minimal O2 support at 1-2 L nasal cannula -wean to RA  Bilateral lower lobe pneumonia with septic shock Required vasopressor support with Levophed during initial hospital stay-blood pressure now stable without support -afebrile -WBC normalized -- finished abx  Complex right pleural effusion with septations -history of requiring multiple prior thoracentesis procedures -status post placement of pigtail chest tube 9/28 by critical care -ongoing tube and effusion management per critical care -pleural fluid studies consistent with exudate per Light's criteria -culture negative thus far -tPA instilled in tube 9/29 and again 10/1 -d/c vanc -CT 10/2-Moderate right pleural effusion and trace left pleural effusion.  Bilateral lower lobe  airspace opacities could reflect atelectasis or pneumonia.  Acute metabolic encephalopathy due to hypercarbia -recurrent confusion Initial encephalopathy felt to be due to respiratory failure with hypercarbia and resolved with clinical improvement and utilization of BiPAP -recurrent confusion likely  sundowning/delirium-no focal neurologic deficits to suggest CVA - B12 and folic acid levels not low  -CT head unrevealing  Persisting sinus tachycardia Has been consistent pretty much since admission -is not on chronic beta-blocker therapy as patient has difficulty with hypotension during dialysis requiring midodrine -cortisol normal during this hospital stay - TSH normal -is on chronic Eliquis therapy making PE much less likely - monitor   Status post heart transplant Continue usual immunosuppressive therapy with Prograf - PCCM has been in touch with the transplant team at [facility]-- will call today as well [phone])  ESRD on HD MWF Nephrology attending to ongoing dialysis -last HD 10/3 AM  Anemia of chronic kidney disease Erythropoietin per Nephrology team  Possible akathisia -d/c seroquel and added to allergies -trial of benadryl IV unsuccessful -consult neuro   -discussed with [facility] transplant team-- will get repeat blood cultures and tac level-- have also at their request started a transfer to [facility] Communication: sister at bedside    Objective: Blood pressure 120/70, pulse (!) 104, temperature 97.7 F (36.5 C), temperature source Axillary, resp. rate 19, weight 67.8 kg, SpO2 100%.  Intake/Output Summary (Last 24 hours) at 07/27/2023 1224 Last data filed at 07/27/2023 0900 Gross per 24 hour  Intake 1083.23 ml  Output 100 ml  Net 983.23 ml   Filed Weights   07/24/23 1902 07/25/23 0409 07/26/23 0313  Weight: 66 kg 65.6 kg 67.8  kg    Examination:  General: Appearance:    Older than stated age male in no acute distress     Lungs:     On [place], diminished, respirations  unlabored  Heart:    Tachycardic. .   MS:   All extremities are intact.   Neurologic:   Awake, alert at times-- abnormal eye and head movement     CBC: Recent Labs  Lab 07/21/23 0445 07/21/23 0503 07/26/23 0415 07/26/23 0943 07/26/23 1915 07/27/23 0927  WBC 7.2   < > 3.9*  --  4.3 3.9*  NEUTROABS 5.3  --   --   --   --   --   HGB 12.7*   < > 9.3* 8.7* 8.4* 9.0*  HCT 45.3   < > 32.3* 30.1* 28.9* 31.0*  MCV 100.2*   < > 97.6  --  99.7 100.6*  PLT 194   < > 105*  --  106* 112*   < > = values in this interval not displayed.   Basic Metabolic Panel: Recent Labs  Lab 07/21/23 0919 07/21/23 1246 07/21/23 1302 07/24/23 0503 07/25/23 0454 07/26/23 1915 07/27/23 0927  NA  --  137   < > 133* 134* 132* 133*  K  --  5.1   < > 4.4 4.2 4.5 4.3  CL  --  105  --  95* 95* 94* 94*  CO2  --  21*  --  21* 26 21* 25  GLUCOSE  --  68*  --  100* 87 80 65*  BUN  --  44*  --  75* 29* 57* 22  CREATININE  --  6.83*  --  8.27* 4.99* 8.16* 3.92*  CALCIUM  --  7.7*  --  7.6* 7.4* 7.0* 7.0*  MG 2.0  --   --  2.1  --   --   --   PHOS 5.6* 5.2*  --   --  5.6* 8.7*  --    < > = values in this interval not displayed.   GFR: Estimated Creatinine Clearance: 17.8 mL/min (A) (by C-G formula based on SCr of 3.92 mg/dL (H)).   Scheduled Meds:  apixaban  2.5 mg Oral BID   arformoterol  15 mcg Nebulization BID   atorvastatin  20 mg Oral q1800   Chlorhexidine Gluconate Cloth  6 each Topical Q0600   clopidogrel  75 mg Oral q morning   darbepoetin (ARANESP) injection - NON-DIALYSIS  150 mcg Subcutaneous Q Wed-1800   feeding supplement  237 mL Oral BID BM   mirtazapine  30 mg Oral QHS   mycophenolate  180 mg Oral BID   pantoprazole  40 mg Oral Daily   revefenacin  175 mcg Nebulization Daily   senna  2 tablet Oral BID   sodium chloride flush  10 mL Intrapleural Q8H   sodium chloride flush  3 mL Intravenous Q12H   tacrolimus  6 mg Oral BID   Continuous Infusions:  sodium chloride     albumin human        LOS: 6 days   Marlin Canary DO  If 7PM-7AM, please contact night-coverage per Amion 07/27/2023, 12:24 PM

## 2023-07-27 NOTE — Plan of Care (Signed)

## 2023-07-27 NOTE — Progress Notes (Signed)
RT called by RN stating that MD request for pt to be placed back on BiPAP. RT at bedside and placed pt on BiPAP. Pt tolerating well at this time. RN notified.

## 2023-07-27 NOTE — Progress Notes (Signed)
Inpatient Rehab Admissions Coordinator:   Note transfer to [facility] pending.    Estill Dooms, PT, DPT Admissions Coordinator [phone] 07/27/23  3:31 PM

## 2023-07-27 NOTE — Discharge Summary (Addendum)
Physician Discharge Summary  Rickey Erickson WGN:[id number] DOB: 1956/04/20 DOA: 07/21/2023    Admit date: 07/21/2023 Discharge date: 07/27/2023  Admitted From: home Discharge disposition: [facility]-- accepting physician Dr. Tresea Mall   Recommendations for Outpatient Follow-Up:   Consider repeat blood cultures (anerobic bottle: cutibacterium acnes from 9/27 culture resulted 10/3) Per pulm: would recommend a more anterior chest tube placed and CT surgery eval although not sure he's in the best shape for VATS.  Recommend prolonged abx course targeting propionibacterium at discretion of transplant ID at accepting institution.  Discharge Diagnosis:   Principal Problem:   Respiratory failure (HCC) Active Problems:   Pleural effusion on right    History of Present Illness:  67 year old male status post heart transplant 2021 following a massive myocardial infarction. Patient had multiple thoracentesis over the last 2 years and presents with 10 days of somnolence noted to be hypercarbic in the ER placed on BiPAP with improvement. He gets dialysis Monday Wednesdays and Fridays he is due for treatment today. He usually receives his [facility] at [facility] but we have been notified that they are not accepting patients due to lack of beds. He will be admitted to intensive care unit we will stop his Plavix and attempt thoracentesis at a later date.      A/P: Acute hypoxic and hypercarbic respiratory failure - resolved Required BiPAP at initial presentation with symptoms of severe dyspnea -now successfully weaned to minimal O2 support at 1-2 L nasal cannula -wean to RA as able (did not appear to be on prior to admission)   Bilateral lower lobe pneumonia with septic shock Required vasopressor support with Levophed during initial hospital stay-blood pressure now stable without support (other than midodrine)  -afebrile -WBC normalized  -Recommend prolonged abx course targeting propionibacterium  at discretion of transplant ID at accepting institution   Complex right pleural effusion with septations -history of requiring multiple prior thoracentesis procedures  -status post placement of pigtail chest tube 9/28 by critical care -ongoing tube and effusion management per critical care -pleural fluid studies consistent with exudate per Light's criteria -culture negative   -tPA instilled in tube 9/29 and again 10/1 -CT 10/2-Moderate right pleural effusion and trace left pleural effusion.  Bilateral lower lobe airspace opacities could reflect atelectasis or pneumonia. Per pulm: would recommend a more anterior chest tube placed and CT surgery eval although not sure he's in the best shape for VATS.   Acute metabolic encephalopathy due to hypercarbia and now with recurrent confusion Initial encephalopathy felt to be due to respiratory failure with hypercarbia and resolved with clinical improvement and utilization of BiPAP  -recurrent confusion could be  sundowning/delirium or medication related -no focal neurologic deficits to suggest CVA  - B12 and folic acid levels not low  -CT head unrevealing -given 1 dose of Seroquel PM of 10/1 and developed restlessness and confusion 3 hours after per sister -neurology consulted: pending but upon re-evaluation at 2:21 his mental status has cleared and was back close to his baseline   Persisting sinus tachycardia Has been consistent pretty much since admission -is not on chronic beta-blocker therapy as patient has difficulty with hypotension during dialysis requiring midodrine  -cortisol normal during this hospital stay  - TSH normal  -is on chronic Eliquis therapy making PE much less likely    Status post heart transplant Continue usual immunosuppressive therapy with Prograf -plan to transfer to [facility]   ESRD on HD [facility] Nephrology attending to ongoing dialysis -last HD  10/3 AM- no fluid removed   Anemia of chronic kidney disease Erythropoietin per  Nephrology team       Medical Consultants:    PCCM Neurology nephrology   Discharge Exam:   Vitals:   07/27/23 0703 07/27/23 0751  BP: 120/70   Pulse: (!) 102 (!) 104  Resp: 18 19  Temp: 97.7 F (36.5 C)   SpO2: 100%    Vitals:   07/27/23 0515 07/27/23 0534 07/27/23 0703 07/27/23 0751  BP: (!) 122/48 (!) 105/51 120/70   Pulse: 100 (!) 104 (!) 102 (!) 104  Resp: 20 20 18 19   Temp:  (!) 97.5 F (36.4 C) 97.7 F (36.5 C)   TempSrc:  Axillary Axillary   SpO2: 100% 100% 100%   Weight:          The results of significant diagnostics from this hospitalization (including imaging, microbiology, ancillary and laboratory) are listed below for reference.     Procedures and Diagnostic Studies:   DG CHEST PORT 1 VIEW  Result Date: 07/22/2023 CLINICAL DATA:  Status post chest tube placement. EXAM: PORTABLE CHEST 1 VIEW COMPARISON:  Chest radiograph dated 07/21/2023. FINDINGS: The heart is enlarged. Vascular calcifications are seen in the aortic arch. There has been interval placement of a right-sided pleural pigtail catheter with interval decreased size of a right pleural effusion with associated atelectasis. There remains trace right pleural effusion and mild right atelectasis. There are two areas of apparent kinking of the pleural catheter. A left pleural effusion with associated atelectasis/airspace disease appears unchanged. A vascular graft terminating in the right atrium is redemonstrated. IMPRESSION: 1. Interval placement of a right-sided pleural pigtail catheter with interval decreased size of a right pleural effusion. There are two areas of apparent kinking of the pleural catheter. 2. Unchanged left pleural effusion with associated atelectasis/airspace disease. Electronically Signed   By: Romona Curls M.D.   On: 07/22/2023 16:06   [place] EKG SITE RITE  Result Date: 07/21/2023 If Site Rite image not attached, placement could not be confirmed due to current cardiac  rhythm.  DG Chest Port 1 View  Result Date: 07/21/2023 CLINICAL DATA:  Questionable sepsis.  Evaluate for abnormality. EXAM: PORTABLE CHEST 1 VIEW COMPARISON:  03/07/2023 FINDINGS: Portable view of the chest demonstrates a dialysis catheter that terminates in the right atrium this is most compatible with a HeRO graft. Increased densities in the mid and lower right lung is suggestive for increased pleural fluid. There also appears to be increased pleural-based densities at the left lung base and along the lateral left chest. Cardiac silhouette appears to be enlarged. Patient has median sternotomy wires. Cannot exclude enlarged central vascular structures. IMPRESSION: 1. Increased densities in the mid and lower right chest are concerning for pleural fluid and possibly lung consolidation. There is also concern for increased pleural based disease in the left lung. 2. Cardiomegaly with postsurgical changes. Electronically Signed   By: Richarda Overlie M.D.   On: 07/21/2023 06:46    CT scan 10/2:  CLINICAL DATA:  Pleural effusion   EXAM: CT CHEST WITHOUT CONTRAST   TECHNIQUE: Multidetector CT imaging of the chest was performed following the standard protocol without IV contrast.   RADIATION DOSE REDUCTION: This exam was performed according to the departmental dose-optimization program which includes automated exposure control, adjustment of the mA and/or kV according to patient size and/or use of iterative reconstruction technique.   COMPARISON:  Chest x-ray today   FINDINGS: Cardiovascular: Heart is normal size. Aorta is normal  caliber. Coronary artery and aortic atherosclerosis. Prior CABG. Dialysis catheter tip in the right atrium.   Mediastinum/Nodes: No mediastinal, hilar, or axillary adenopathy. Trachea and esophagus are unremarkable. Thyroid unremarkable.   Lungs/Pleura: Moderate right pleural effusion and trace left pleural effusion. Right posterior chest tube in place. Bilateral lower  lobe airspace opacities, right greater than left. This could reflect atelectasis or pneumonia. Predominantly linear opacities in the lingula are stable compatible with scarring.   Upper Abdomen: No acute findings. Bilateral nephrolithiasis. No hydronephrosis.   Musculoskeletal: Chest wall soft tissues are unremarkable. No acute bony abnormality.   IMPRESSION: Moderate right pleural effusion and trace left pleural effusion. Bilateral lower lobe airspace opacities could reflect atelectasis or pneumonia.   Right posterior chest tube in place.  No pneumothorax.   Scarring in the lingula.   Bilateral nephrolithiasis.  No hydronephrosis.   Aortic Atherosclerosis (ICD10-I70.0).     Electronically Signed   By: Charlett Nose M.D.   On: 07/26/2023 17:24  Labs:   Basic Metabolic Panel: Recent Labs  Lab 07/21/23 0919 07/21/23 1246 07/21/23 1302 07/24/23 0503 07/25/23 0454 07/26/23 1915 07/27/23 0927  NA  --  137 137 133* 134* 132* 133*  K  --  5.1 4.3 4.4 4.2 4.5 4.3  CL  --  105  --  95* 95* 94* 94*  CO2  --  21*  --  21* 26 21* 25  GLUCOSE  --  68*  --  100* 87 80 65*  BUN  --  44*  --  75* 29* 57* 22  CREATININE  --  6.83*  --  8.27* 4.99* 8.16* 3.92*  CALCIUM  --  7.7*  --  7.6* 7.4* 7.0* 7.0*  MG 2.0  --   --  2.1  --   --   --   PHOS 5.6* 5.2*  --   --  5.6* 8.7*  --    GFR Estimated Creatinine Clearance: 17.8 mL/min (A) (by C-G formula based on SCr of 3.92 mg/dL (H)). Liver Function Tests: Recent Labs  Lab 07/21/23 0445 07/21/23 1246 07/22/23 1559 07/24/23 0503 07/25/23 0454 07/26/23 1915  AST 15  --   --  13*  --   --   ALT 11  --   --  12  --   --   ALKPHOS 63  --   --  50  --   --   BILITOT 0.2*  --   --  0.5  --   --   PROT 6.8  --  5.9* 5.7*  --   --   ALBUMIN 3.7 2.8*  --  3.1* 3.0* 2.8*   Recent Labs  Lab 07/21/23 0919  LIPASE 43  AMYLASE 43   Recent Labs  Lab 07/21/23 0622  AMMONIA 57*   Coagulation profile Recent Labs  Lab  07/21/23 0445  INR 1.0    CBC: Recent Labs  Lab 07/21/23 0445 07/21/23 0503 07/24/23 0503 07/25/23 0454 07/26/23 0415 07/26/23 0943 07/26/23 1915 07/27/23 0927  WBC 7.2   < > 5.3 4.1 3.9*  --  4.3 3.9*  NEUTROABS 5.3  --   --   --   --   --   --   --   HGB 12.7*   < > 10.0* 9.9* 9.3* 8.7* 8.4* 9.0*  HCT 45.3   < > 34.1* 33.7* 32.3* 30.1* 28.9* 31.0*  MCV 100.2*   < > 95.3 98.8 97.6  --  99.7 100.6*  PLT 194   < >  117* 117* 105*  --  106* 112*   < > = values in this interval not displayed.   Cardiac Enzymes: No results for input(s): "CKTOTAL", "CKMB", "CKMBINDEX", "TROPONINI" in the last 168 hours. BNP: Invalid input(s): "POCBNP" CBG: Recent Labs  Lab 07/21/23 1025 07/21/23 1926  GLUCAP 86 92   D-Dimer No results for input(s): "DDIMER" in the last 72 hours. Hgb A1c No results for input(s): "HGBA1C" in the last 72 hours. Lipid Profile No results for input(s): "CHOL", "HDL", "LDLCALC", "TRIG", "CHOLHDL", "LDLDIRECT" in the last 72 hours. Thyroid function studies Recent Labs    07/25/23 0454  TSH 1.829   Anemia work up Recent Labs    07/25/23 0454  VITAMINB12 823  FOLATE 6.8   Microbiology Recent Results (from the past 240 hour(s))  Resp panel by RT-PCR (RSV, Flu A&B, Covid) Anterior Nasal Swab     Status: None   Collection Time: 07/21/23  4:21 AM   Specimen: Anterior Nasal Swab  Result Value Ref Range Status   SARS Coronavirus 2 by RT PCR NEGATIVE NEGATIVE Final   Influenza A by PCR NEGATIVE NEGATIVE Final   Influenza B by PCR NEGATIVE NEGATIVE Final    Comment: (NOTE) The Xpert Xpress SARS-CoV-2/FLU/RSV plus assay is intended as an aid in the diagnosis of influenza from Nasopharyngeal swab specimens and should not be used as a sole basis for treatment. Nasal washings and aspirates are unacceptable for Xpert Xpress SARS-CoV-2/FLU/RSV testing.  Fact Sheet for Patients: BloggerCourse.com  Fact Sheet for Healthcare  Providers: SeriousBroker.it  This test is not yet approved or cleared by the [place] FDA and has been authorized for detection and/or diagnosis of SARS-CoV-2 by FDA under an Emergency Use Authorization (EUA). This EUA will remain in effect (meaning this test can be used) for the duration of the COVID-19 declaration under Section 564(b)(1) of the Act, 21 U.S.C. section 360bbb-3(b)(1), unless the authorization is terminated or revoked.     Resp Syncytial Virus by PCR NEGATIVE NEGATIVE Final    Comment: (NOTE) Fact Sheet for Patients: BloggerCourse.com  Fact Sheet for Healthcare Providers: SeriousBroker.it  This test is not yet approved or cleared by the [place] FDA and has been authorized for detection and/or diagnosis of SARS-CoV-2 by FDA under an Emergency Use Authorization (EUA). This EUA will remain in effect (meaning this test can be used) for the duration of the COVID-19 declaration under Section 564(b)(1) of the Act, 21 U.S.C. section 360bbb-3(b)(1), unless the authorization is terminated or revoked.  Performed at [facility], [address] [place], [place] [address]   Blood Culture (routine x 2)     Status: Abnormal   Collection Time: 07/21/23  4:49 AM   Specimen: BLOOD  Result Value Ref Range Status   Specimen Description BLOOD SITE NOT SPECIFIED  Final   Special Requests   Final    BOTTLES DRAWN AEROBIC AND ANAEROBIC Blood Culture results may not be optimal due to an excessive volume of blood received in culture bottles   Culture  Setup Time   Final    GRAM POSITIVE RODS ANAEROBIC BOTTLE ONLY CRITICAL RESULT CALLED TO, READ BACK BY AND VERIFIED WITH: PHARMD EClinton Sawyer [id number] @2054  FH    Culture (A)  Final    CUTIBACTERIUM ACNES Standardized susceptibility testing for this organism is not available. Performed at [facility], [address], [place], [place]  [address]    Report Status 07/27/2023 FINAL  Final  MRSA Next Gen by  PCR, Nasal     Status: None   Collection Time: 07/21/23 10:25 AM   Specimen: Nasal Mucosa; Nasal Swab  Result Value Ref Range Status   MRSA by PCR Next Gen NOT DETECTED NOT DETECTED Final    Comment: (NOTE) The GeneXpert MRSA Assay (FDA approved for NASAL specimens only), is one component of a comprehensive MRSA colonization surveillance program. It is not intended to diagnose MRSA infection nor to guide or monitor treatment for MRSA infections. Test performance is not FDA approved in patients less than 39 years old. Performed at [facility], [address] [place], [place] [address]   Body fluid culture w Gram Stain     Status: None   Collection Time: 07/22/23  4:12 PM   Specimen: Pleural Fluid  Result Value Ref Range Status   Specimen Description PLEURAL  Final   Special Requests NONE  Final   Gram Stain NO WBC SEEN NO ORGANISMS SEEN   Final   Culture   Final    NO GROWTH 3 DAYS Performed at [facility], [address], [place], [place] [address]    Report Status 07/26/2023 FINAL  Final  MRSA Next Gen by PCR, Nasal     Status: None   Collection Time: 07/26/23  9:40 AM   Specimen: Nasal Mucosa; Nasal Swab  Result Value Ref Range Status   MRSA by PCR Next Gen NOT DETECTED NOT DETECTED Final    Comment: (NOTE) The GeneXpert MRSA Assay (FDA approved for NASAL specimens only), is one component of a comprehensive MRSA colonization surveillance program. It is not intended to diagnose MRSA infection nor to guide or monitor treatment for MRSA infections. Test performance is not FDA approved in patients less than 36 years old. Performed at [facility], [address], [place], [place] [address]      Discharge Instructions:   Discharge Instructions     No wound care   Complete by: As directed       Allergies as of 07/27/2023       Reactions   Transderm-scop [scopolamine] Other (See  Comments)   Hallucinations Induces severe delirium    Nsaids Other (See Comments)   Kidney impact   Seroquel [quetiapine] Other (See Comments)   akathesia   Ativan [lorazepam] Other (See Comments)   Hallucinations Severe somnolence/delirium         Medication List     STOP taking these medications    amLODipine 10 MG tablet Commonly known as: NORVASC       TAKE these medications    acetaminophen 500 MG tablet Commonly known as: TYLENOL Take 500 mg by mouth every 6 (six) hours as needed for headache (pain).   albumin human 25 % bottle Inject 100 mLs (25 g total) into the vein as needed (Give during dialysis for SBP <100.).   albuterol 108 (90 Base) MCG/ACT inhaler Commonly known as: VENTOLIN HFA Inhale 2 puffs into the lungs every 6 (six) hours as needed for wheezing or shortness of breath.   arformoterol 15 MCG/2ML Nebu Commonly known as: BROVANA Take 2 mLs (15 mcg total) by nebulization 2 (two) times daily.   atorvastatin 20 MG tablet Commonly known as: LIPITOR Take 20 mg by mouth every morning.   CALCIUM PO Take 1 tablet by mouth 2 (two) times daily.   clopidogrel 75 MG tablet Commonly known as: PLAVIX Take 75 mg by mouth every morning.   Darbepoetin Alfa 150 MCG/0.3ML Sosy injection  Commonly known as: ARANESP Inject 0.3 mLs (150 mcg total) into the skin every Wednesday at 6 PM. Start taking on: August 02, 2023   Eliquis 2.5 MG Tabs tablet Generic drug: apixaban Take 2.5 mg by mouth 2 (two) times daily.   midodrine 10 MG tablet Commonly known as: PROAMATINE Take 10 mg by mouth See admin instructions. Take 10 mg once daily prior to dialysis on Monday, Wednesday, Friday.   mirtazapine 30 MG tablet Commonly known as: REMERON Take 30 mg by mouth at bedtime.   mycophenolate 180 MG EC tablet Commonly known as: MYFORTIC Take 180 mg by mouth 2 (two) times daily.   pantoprazole 40 MG tablet Commonly known as: PROTONIX Take 40 mg by mouth every  morning.   revefenacin 175 MCG/3ML nebulizer solution Commonly known as: YUPELRI Take 3 mLs (175 mcg total) by nebulization daily. Start taking on: July 28, 2023   tacrolimus 1 MG capsule Commonly known as: PROGRAF Take 1 mg by mouth 2 (two) times daily. Take in addition to 5 mg capsule twice daily, for a total dose of 6 mg twice daily.   tacrolimus 5 MG capsule Commonly known as: PROGRAF Take 5 mg by mouth 2 (two) times daily. Take in addition to 1 mg capsule twice daily, for a total dose of 6 mg twice daily.          Time coordinating discharge: 65 min  Signed:  Joseph Art DO  [facility] 07/27/2023, 1:13 PM

## 2023-07-27 NOTE — Consult Note (Addendum)
NEURO HOSPITALIST CONSULT NOTE   Requesting physician: Dr. Benjamine Mola  Reason for Consult: Akathisia  History obtained from:  Patient, Chart and Sister    HPI:                                                                                                                                          Rickey Erickson is an 67 y.o. male with a PMHx of heart transplant in 2021 following a devastating MI, on chronic immunosuppressive therapy with Prograf, ESRD on HD, HTN, and recurrent pleural effusions requiring drainage who presented to the hospital on 9/27 with a 10-day history of progressive somnolence and shortness of breath. He was found to be in acute hypercarbic and hypoxic respiratory failure and required initiation of BiPAP. He has been diagnosed with bilateral lower lobe PNA with septic shock during this admission. He currently has a chest tube in place for drainage of a complex right pleural effusion with septations.  During his stay, he has experienced continued symptoms and signs of encephalopathy. Initially, his encephalopathy was felt to be due to respiratory failure with hypercarbia as it had resolved with clinical improvement and utilization of BiPAP. Since then, he has had recurrent confusion felt likely to be low-grade sundowning. There were no focal neurologic deficits to suggest CVA. B12 and folic acid levels were not low. CT head was unrevealing.   Yesterday, he was noted to have developed akathisia with squirming movements of his body and limbs. It did not appear to be a dyskinesia per Hospitalist team. It seemed to start after administration of Seroquel. Benadryl was trialed without relief of the akathisia. After HD, the akathisic symptoms involving his lower body seemed to improve, but appear to have migrated to his neck and head, where he will turn his head back and forth and eyes become real big. Sister states he has been sleeping since HD and symptoms are much improved,  and are less frequent than yesterday.  While at the bedside no abnormal movements noted. Sister at the bedside states he does not do well with some medications and doe snot want him to have any benzos or seroquel due to his reactions.   Past Medical History:  Diagnosis Date   Anemia    Heart failure (HCC)    Hypertension    Osteoporosis    Renal disorder     Past Surgical History:  Procedure Laterality Date   AV FISTULA PLACEMENT Right    HEART TRANSPLANT     THORACENTESIS Right 06/22/2021   Procedure: THORACENTESIS;  Surgeon: Leslye Peer, MD;  Location: [facility] ENDOSCOPY;  Service: Cardiopulmonary;  Laterality: Right;    History reviewed. No pertinent family history.          Social History:  reports that he  has never smoked. He has never used smokeless tobacco. He reports that he does not drink alcohol and does not use drugs.  Allergies  Allergen Reactions   Transderm-Scop [Scopolamine] Other (See Comments)    Hallucinations Induces severe delirium    Nsaids Other (See Comments)    Kidney impact   Seroquel [Quetiapine] Other (See Comments)    akathesia   Ativan [Lorazepam] Other (See Comments)    Hallucinations Severe somnolence/delirium     MEDICATIONS:                                                                                                                     I have reviewed the patient's current medications.   ROS:                                                                                                                                       History obtained unobtainable from patient due to mental status    Blood pressure 120/70, pulse (!) 104, temperature 97.7 F (36.5 C), temperature source Axillary, resp. rate 19, weight 67.8 kg, SpO2 100%.   General Examination:                                                                                                       Physical Exam  HEENT-  Normocephalic, no lesions, without obvious abnormality.   Normal external eye and conjunctiva.   Cardiovascular- S1-S2 audible, pulses palpable throughout   Lungs-no rhonchi or wheezing noted, no excessive working breathing.  Saturations within normal limits Abdomen- All 4 quadrants palpated and nontender Extremities- Warm, dry and intact Musculoskeletal-no joint tenderness, deformity or swelling Skin-warm and dry, no hyperpigmentation, vitiligo, or suspicious lesions  Neurological Examination Mental Status: His very lethargic, needs a lot of arousal to open eyes. Will open eyes briefly but does not stay engaged and drifts back off, needing constant stimulation. He is alert and oriented to self and can identify sister by name at the bedside. Will  intermittently follow simple commands Cranial Nerves: II:  Visual fields blinks to threat bilaterally  III,IV, VI: ptosis not present, extra-ocular motions intact bilaterally pupils equal, round, reactive to light and accommodation V,VII: smile symmetric, facial light touch sensation normal bilaterally VIII: hearing normal bilaterally IX,X: uvula rises symmetrically XI: bilateral shoulder shrug XII: midline tongue extension Motor: Right arm and left arm antigravity but does drop to the bed. Bilateral lower extremities does not lift off the bed, withdraws to painful stimuli  Tone and bulk:abnormal Sensory: responds to painful stimuli  Deep Tendon Reflexes: 2+ and symmetric throughout Plantars: Right: downgoing   Left: downgoing Cerebellar: Unable to assess  Gait: deferred    Lab Results: Basic Metabolic Panel: Recent Labs  Lab 07/21/23 0445 07/21/23 0503 07/21/23 0919 07/21/23 1246 07/21/23 1302 07/24/23 0503 07/25/23 0454 07/26/23 1915  NA 135   < >  --  137 137 133* 134* 132*  K 5.6*   < >  --  5.1 4.3 4.4 4.2 4.5  CL 98  --   --  105  --  95* 95* 94*  CO2 22  --   --  21*  --  21* 26 21*  GLUCOSE 142*  --   --  68*  --  100* 87 80  BUN 44*  --   --  44*  --  75* 29* 57*  CREATININE  6.73*  --   --  6.83*  --  8.27* 4.99* 8.16*  CALCIUM 8.3*  --   --  7.7*  --  7.6* 7.4* 7.0*  MG  --   --  2.0  --   --  2.1  --   --   PHOS  --   --  5.6* 5.2*  --   --  5.6* 8.7*   < > = values in this interval not displayed.    CBC: Recent Labs  Lab 07/21/23 0445 07/21/23 0503 07/21/23 1246 07/21/23 1302 07/24/23 0503 07/25/23 0454 07/26/23 0415 07/26/23 0943 07/26/23 1915  WBC 7.2  --  5.2  --  5.3 4.1 3.9*  --  4.3  NEUTROABS 5.3  --   --   --   --   --   --   --   --   HGB 12.7*   < > 10.6*   < > 10.0* 9.9* 9.3* 8.7* 8.4*  HCT 45.3   < > 36.7*   < > 34.1* 33.7* 32.3* 30.1* 28.9*  MCV 100.2*  --  101.4*  --  95.3 98.8 97.6  --  99.7  PLT 194  --  153  --  117* 117* 105*  --  106*   < > = values in this interval not displayed.    Cardiac Enzymes: No results for input(s): "CKTOTAL", "CKMB", "CKMBINDEX", "TROPONINI" in the last 168 hours.  Lipid Panel: No results for input(s): "CHOL", "TRIG", "HDL", "CHOLHDL", "VLDL", "LDLCALC" in the last 168 hours.  Imaging: CT CHEST WO CONTRAST  Result Date: 07/26/2023 CLINICAL DATA:  Pleural effusion, malignancy suspected EXAM: CT CHEST WITHOUT CONTRAST TECHNIQUE: Multidetector CT imaging of the chest was performed following the standard protocol without IV contrast. RADIATION DOSE REDUCTION: This exam was performed according to the departmental dose-optimization program which includes automated exposure control, adjustment of the mA and/or kV according to patient size and/or use of iterative reconstruction technique. COMPARISON:  Chest x-ray today FINDINGS: Cardiovascular: Heart is normal size. Aorta is normal caliber. Coronary artery and aortic atherosclerosis. Prior CABG. Dialysis  catheter tip in the right atrium. Mediastinum/Nodes: No mediastinal, hilar, or axillary adenopathy. Trachea and esophagus are unremarkable. Thyroid unremarkable. Lungs/Pleura: Moderate right pleural effusion and trace left pleural effusion. Right posterior  chest tube in place. Bilateral lower lobe airspace opacities, right greater than left. This could reflect atelectasis or pneumonia. Predominantly linear opacities in the lingula are stable compatible with scarring. Upper Abdomen: No acute findings. Bilateral nephrolithiasis. No hydronephrosis. Musculoskeletal: Chest wall soft tissues are unremarkable. No acute bony abnormality. IMPRESSION: Moderate right pleural effusion and trace left pleural effusion. Bilateral lower lobe airspace opacities could reflect atelectasis or pneumonia. Right posterior chest tube in place.  No pneumothorax. Scarring in the lingula. Bilateral nephrolithiasis.  No hydronephrosis. Aortic Atherosclerosis (ICD10-I70.0). Electronically Signed   By: Charlett Nose M.D.   On: 07/26/2023 17:24   DG CHEST PORT 1 VIEW  Result Date: 07/26/2023 CLINICAL DATA:  Chest tube in place. EXAM: PORTABLE CHEST 1 VIEW COMPARISON:  July 25, 2023. FINDINGS: Stable cardiomediastinal silhouette. Stable right-sided chest tube without definite pneumothorax. Bibasilar atelectasis is noted with probable small left pleural effusion. IMPRESSION: Bibasilar atelectasis with small left pleural effusion. Stable position of right-sided chest tube. Electronically Signed   By: Lupita Raider M.D.   On: 07/26/2023 09:53   CT HEAD WO CONTRAST ( )  Result Date: 07/26/2023 CLINICAL DATA:  Delirium EXAM: CT HEAD WITHOUT CONTRAST TECHNIQUE: Contiguous axial images were obtained from the base of the skull through the vertex without intravenous contrast. RADIATION DOSE REDUCTION: This exam was performed according to the departmental dose-optimization program which includes automated exposure control, adjustment of the mA and/or kV according to patient size and/or use of iterative reconstruction technique. COMPARISON:  None Available. FINDINGS: Motion degraded examination. Brain: There is no mass, hemorrhage or extra-axial collection. The size and configuration of the  ventricles and extra-axial CSF spaces are normal. The brain parenchyma is normal, without acute or chronic infarction. Vascular: Atherosclerotic calcification of the internal carotid arteries at the skull base. No abnormal hyperdensity of the major intracranial arteries or dural venous sinuses. Skull: The visualized skull base, calvarium and extracranial soft tissues are normal. Sinuses/Orbits: No fluid levels or advanced mucosal thickening of the visualized paranasal sinuses. No mastoid or middle ear effusion. The orbits are normal. IMPRESSION: Motion degraded examination without acute intracranial abnormality. Electronically Signed   By: Deatra Robinson M.D.   On: 07/26/2023 01:43   DG Chest Port 1 View  Result Date: 07/25/2023 CLINICAL DATA:  Chest tube in place. EXAM: PORTABLE CHEST 1 VIEW COMPARISON:  July 24, 2023. FINDINGS: Stable cardiomediastinal silhouette. Right-sided chest tube is unchanged in position. No pneumothorax is noted. Small left pleural effusion is noted with associated atelectasis. Mildly increased right basilar atelectasis is noted. Sternotomy wires are noted. IMPRESSION: Stable right-sided chest tube without pneumothorax. Mildly increased right basilar atelectasis. Stable small left pleural effusion with associated atelectasis. Electronically Signed   By: Lupita Raider M.D.   On: 07/25/2023 13:22     Assessment: 67 y.o. male with a PMHx of heart transplant in 2021 following a devastating MI, on chronic immunosuppressive therapy with Prograf, ESRD on HD, HTN, and recurrent pleural effusions requiring drainage who presented to the hospital on 9/27 with a 10-day history of progressive somnolence and shortness of breath. He was found to be in acute hypercarbic and hypoxic respiratory failure and required initiation of BiPAP. He has been diagnosed with bilateral lower lobe PNA with septic shock during this admission. He currently has a chest tube  in place for drainage of a complex  right pleural effusion with septations. Yesterday, he was noted to have developed akathisia with squirming movements of his body and limbs.  It seemed to start after administration of Seroquel. Benadryl was trialed without relief of the akathisia. After HD, the akathisic symptoms involving his lower body seemed to improve, but appeared to have migrated to his neck and head, where he will turn his head back and forth and eyes "become real big".  - On Neurological exam patient is is very drowsy and needs a lot of stimulation to be aroused. He will briefly open eyes but does not stay engaged and needs constant stimulation to remain awake. He is oriented to self and name of his sister who is at the bedside. Will intermittently follow commands. Bilateral upper extremities are antigravity and drop to the bed, bilateral lowers withdraws to painful stimuli. During exam no abnormal movements were noted - Clinical impression: Transient akathisia, now resolved. May have been a side effect of the Seroquel he was administered, as symptoms have gradually subsided after dialysis.    Recommendations: - Continue to monitor - Please call Neurology if there are additional questions.   Chart reviewed by Neurology attending. Patient examined by Neurology NP, Gevena Mart. Patient was discharged prior to attending being able to examine. Case discussed at length with Dr. Benjamine Mola and exam findings were discussed with Neurology NP. No charge.   Electronically signed: Dr. Caryl Pina 07/27/2023, 9:56 AM

## 2023-07-27 NOTE — Progress Notes (Signed)
NAME:  Rickey Erickson, MRN:  [id number], DOB:  01/17/1956, LOS: 6 ADMISSION DATE:  07/21/2023, CONSULTATION DATE: 07/21/2023 REFERRING MD: Emergency department, CHIEF COMPLAINT: Somnolence cardiac respiratory failure  History of Present Illness:  67 year old male status post heart transplant 2021 following a massive myocardial infarction.  Patient had multiple thoracentesis over the last 2 years and presents with 10 days of somnolence noted to be hypercarbic in the ER placed on BiPAP with improvement.  He gets dialysis Monday Wednesdays and Fridays he is due for treatment today.  He usually receives his care at [facility] but we have been notified that they are not accepting patients due to lack of beds.  He was admitted and ultimately required chest tube insertion with output. Fluid studies consistent with exudative effusion likely from his current bilateral pneumonia. He was given pleural lytic therapy with good response.  On 2LNC. Wife at bedside. Received Seroquel last night and altered this morning. To receive dialysis today   Pertinent  Medical History   Past Medical History:  Diagnosis Date   Anemia    Heart failure (HCC)    Hypertension    Osteoporosis    Renal disorder      Significant Hospital Events: Including procedures, antibiotic start and stop dates in addition to other pertinent events   9/28: pigtail chest tube placed  9/29: lytics given  10/1: repeated lytics given.  10/2: 650cc output from CT yesterday p lytics. CT today to better evaluate effusion.   Interim History / Subjective:  Received Seroquel last night and is altered this morning. Not participating in exam. Does not appear to be in any distress however. On 2LNC. Wife is at bedside.   Objective   Blood pressure 120/70, pulse (!) 104, temperature 97.7 F (36.5 C), temperature source Axillary, resp. rate 19, weight 67.8 kg, SpO2 100%.    FiO2 (%):  [40 %] 40 % PEEP:  [5 cmH20] 5 cmH20 Pressure  Support:  [13 cmH20] 13 cmH20   Intake/Output Summary (Last 24 hours) at 07/27/2023 0835 Last data filed at 07/27/2023 4098 Gross per 24 hour  Intake 1083.23 ml  Output 100 ml  Net 983.23 ml   Filed Weights   07/24/23 1902 07/25/23 0409 07/26/23 0313  Weight: 66 kg 65.6 kg 67.8 kg    Examination: Very sleepy secondary to residual effects of Seroquel No JVD or lymphadenopathy is appreciated Right chest tube in the 100 cc of bloody drainage Decreased breath sounds on the right Heart sounds are regular Abdomen is soft nontender Extremities are warm Resolved Hospital Problem list     Assessment & Plan:  Acute hypoxic/hypercarbic respiratory failure  Bilateral lower lobe pneumonia Complex right-sided pleural effusion with septations status post multiple thoracentesis Pleural fluid studies are consistent with exudative effusion. S/p lytics on 9/29 & 10/1 Oxygen requirement stable   CT with some right airspace disease chest tube is in place Serial chest x-rays Continue antimicrobial therapy Do not give him any more Seroquel     Rest of the management per primary team, PCCM will follow along for chest tube management  Best Practice (right click and "Reselect all SmartList Selections" daily)  Per primary team  Labs   CBC: Recent Labs  Lab 07/21/23 0445 07/21/23 0503 07/21/23 1246 07/21/23 1302 07/24/23 0503 07/25/23 0454 07/26/23 0415 07/26/23 0943 07/26/23 1915  WBC 7.2  --  5.2  --  5.3 4.1 3.9*  --  4.3  NEUTROABS 5.3  --   --   --   --   --   --   --   --  HGB 12.7*   < > 10.6*   < > 10.0* 9.9* 9.3* 8.7* 8.4*  HCT 45.3   < > 36.7*   < > 34.1* 33.7* 32.3* 30.1* 28.9*  MCV 100.2*  --  101.4*  --  95.3 98.8 97.6  --  99.7  PLT 194  --  153  --  117* 117* 105*  --  106*   < > = values in this interval not displayed.    Basic Metabolic Panel: Recent Labs  Lab 07/21/23 0445 07/21/23 0503 07/21/23 0919 07/21/23 1246 07/21/23 1302 07/24/23 0503  07/25/23 0454 07/26/23 1915  NA 135   < >  --  137 137 133* 134* 132*  K 5.6*   < >  --  5.1 4.3 4.4 4.2 4.5  CL 98  --   --  105  --  95* 95* 94*  CO2 22  --   --  21*  --  21* 26 21*  GLUCOSE 142*  --   --  68*  --  100* 87 80  BUN 44*  --   --  44*  --  75* 29* 57*  CREATININE 6.73*  --   --  6.83*  --  8.27* 4.99* 8.16*  CALCIUM 8.3*  --   --  7.7*  --  7.6* 7.4* 7.0*  MG  --   --  2.0  --   --  2.1  --   --   PHOS  --   --  5.6* 5.2*  --   --  5.6* 8.7*   < > = values in this interval not displayed.   GFR: Estimated Creatinine Clearance: 8.5 mL/min (A) (by C-G formula based on SCr of 8.16 mg/dL (H)). Recent Labs  Lab 07/21/23 0504 07/21/23 0631 07/21/23 0919 07/21/23 1246 07/24/23 0503 07/25/23 0454 07/26/23 0415 07/26/23 1915  PROCALCITON  --   --  0.31  --   --   --   --   --   WBC  --   --   --    < > 5.3 4.1 3.9* 4.3  LATICACIDVEN 1.0 1.0  --   --   --   --   --   --    < > = values in this interval not displayed.    Liver Function Tests: Recent Labs  Lab 07/21/23 0445 07/21/23 1246 07/22/23 1559 07/24/23 0503 07/25/23 0454 07/26/23 1915  AST 15  --   --  13*  --   --   ALT 11  --   --  12  --   --   ALKPHOS 63  --   --  50  --   --   BILITOT 0.2*  --   --  0.5  --   --   PROT 6.8  --  5.9* 5.7*  --   --   ALBUMIN 3.7 2.8*  --  3.1* 3.0* 2.8*   Recent Labs  Lab 07/21/23 0919  LIPASE 43  AMYLASE 43   Recent Labs  Lab 07/21/23 0622  AMMONIA 57*    ABG    Component Value Date/Time   PHART 7.227 (L) 07/21/2023 1302   PCO2ART 56.0 (H) 07/21/2023 1302   PO2ART 83 07/21/2023 1302   HCO3 23.9 07/26/2023 2004   TCO2 25 07/21/2023 1302   ACIDBASEDEF 6.0 (H) 07/26/2023 2004   O2SAT 98.2 07/26/2023 2004     Coagulation Profile: Recent Labs  Lab 07/21/23 0445  INR 1.0    Cardiac Enzymes: No results for input(s): "CKTOTAL", "CKMB", "CKMBINDEX", "TROPONINI" in the last 168 hours.  HbA1C: Hgb A1c MFr Bld  Date/Time Value Ref Range Status   01/06/2022 08:09 AM 4.9 4.8 - 5.6 % Final    Comment:    (NOTE)         Prediabetes: 5.7 - 6.4         Diabetes: >6.4         Glycemic control for adults with diabetes: <7.0     CBG: Recent Labs  Lab 07/21/23 1025 07/21/23 1926  GLUCAP 86 92     Steve Karris Deangelo ACNP Acute Care Nurse Practitioner [facility] Please consult Amion 07/27/2023, 8:37 AM

## 2023-07-27 NOTE — Progress Notes (Signed)
PT Cancellation Note  Patient Details Name: Luke Falero MRN: [id number] DOB: 06-Aug-1956   Cancelled Treatment:    Reason Eval/Treat Not Completed: (P) Patient declined, no reason specified (Pt sleeping upon entry and unable to be awoken. Family member present requests to let pt rest. Will follow up as able.)   Johny Shock 07/27/2023, 12:10 PM

## 2023-07-27 NOTE — Progress Notes (Signed)
  MD request for CSW to fax over patients facesheet to [facility] transfer center. CSW faxed over patients facesheet to [phone]/[facility] transfer center. TOC will continue to follow.

## 2023-07-27 NOTE — Progress Notes (Signed)
Pt returned from HD tx. 

## 2023-07-27 NOTE — Progress Notes (Addendum)
NOT currently on BIPAP-- removed with RT while I was in room-- Off O2 completely was 96% Patient did de-sat when he had a coughing spell earlier per family Breathing appears comfortable off O2 but O2 can be re-applied if needed  Again, mental status back to baseline and abnormal movements have ceased.  JV

## 2023-07-28 NOTE — Progress Notes (Signed)
Discharge Progress Report  Patient Details  Name: Rickey Erickson MRN: [id number] Date of Birth: November 04, 1955 Referring Provider:   [name] Pulmonary Rehab Walk Test from 04/24/2023 in [facility]  Referring Provider [name]        Number of Visits: 37  Reason for Discharge:  Early Exit:  currently hospitalized  Smoking History:  Social History   Tobacco Use  Smoking Status Never  Smokeless Tobacco Never    Diagnosis:  Acute respiratory failure, unspecified whether with hypoxia or hypercapnia (HCC)  ADL UCSD:  Pulmonary Assessment Scores     Row Name 04/24/23 0935         ADL UCSD   ADL Phase Entry     SOB Score total 50       CAT Score   CAT Score 16       mMRC Score   mMRC Score 3              Initial Exercise Prescription:  Initial Exercise Prescription - 04/24/23 0900       Date of Initial Exercise RX and Referring Provider   Date 04/24/23    Referring Provider [name]    Expected Discharge Date 07/20/23      Oxygen   Oxygen Continuous    Liters 2    Maintain Oxygen Saturation 88% or higher      NuStep   Level 1    SPM 50    Minutes 15      Track   Minutes 15    METs 1.5      Prescription Details   Frequency (times per week) 2    Duration Progress to 30 minutes of continuous aerobic without signs/symptoms of physical distress      Intensity   THRR 40-80% of Max Heartrate 62-124    Ratings of Perceived Exertion 11-13    Perceived Dyspnea 0-4      Progression   Progression Continue to progress workloads to maintain intensity without signs/symptoms of physical distress.      Resistance Training   Training Prescription Yes    Weight red bands    Reps 10-15             Discharge Exercise Prescription (Final Exercise Prescription Changes):  Exercise Prescription Changes - 07/06/23 0947       Response to Exercise   Blood Pressure (Admit) 116/64    Blood Pressure  (Exit) 104/60    Heart Rate (Admit) 115 bpm    Heart Rate (Exercise) 122 bpm    Heart Rate (Exit) 109 bpm    Oxygen Saturation (Admit) 100 %    Oxygen Saturation (Exercise) 96 %    Oxygen Saturation (Exit) 100 %    Rating of Perceived Exertion (Exercise) 15    Perceived Dyspnea (Exercise) 3    Duration Continue with 30 min of aerobic exercise without signs/symptoms of physical distress.    Intensity THRR unchanged      Progression   Progression Continue to progress workloads to maintain intensity without signs/symptoms of physical distress.    Average METs 2.7      Resistance Training   Training Prescription Yes    Weight red bands    Reps 10-15    Time 10 Minutes      Oxygen   Oxygen Continuous    Liters 2      NuStep   Level 3    SPM 71  Minutes 15    METs 2.7      Track   Laps 5.5    Minutes 15    METs 1.82      Oxygen   Maintain Oxygen Saturation 88% or higher             Functional Capacity:  6 Minute Walk     Row Name 04/24/23 1112         6 Minute Walk   Phase Initial     Distance 1015 feet     Walk Time 6 minutes     # of Rest Breaks 0     MPH 1.92     METS 3.36     RPE 11     Perceived Dyspnea  3     VO2 Peak 11.75     Symptoms No     Resting HR 110 bpm     Resting BP 120/62     Resting Oxygen Saturation  98 %     Exercise Oxygen Saturation  during 6 min walk 95 %     Max Ex. HR 124 bpm     Max Ex. BP 132/64     2 Minute Post BP 122/64       Interval HR   1 Minute HR 114     2 Minute HR 118     3 Minute HR 121     4 Minute HR 123     5 Minute HR 123     6 Minute HR 124     Interval Heart Rate? Yes       Interval Oxygen   Interval Oxygen? Yes     Baseline Oxygen Saturation % 98 %     1 Minute Oxygen Saturation % 99 %     1 Minute Liters of Oxygen 2 L     2 Minute Oxygen Saturation % 97 %     2 Minute Liters of Oxygen 2 L     3 Minute Oxygen Saturation % 95 %     3 Minute Liters of Oxygen 2 L     4 Minute Oxygen  Saturation % 97 %     4 Minute Liters of Oxygen 2 L     5 Minute Oxygen Saturation % 95 %     5 Minute Liters of Oxygen 2 L     6 Minute Oxygen Saturation % 96 %     6 Minute Liters of Oxygen 2 L     2 Minute Post Oxygen Saturation % 100 %     2 Minute Post Liters of Oxygen 2 L              Psychological, QOL, Others - Outcomes: PHQ 2/9:    04/24/2023    9:22 AM  Depression screen PHQ 2/9  Decreased Interest 0  Down, Depressed, Hopeless 0  PHQ - 2 Score 0  Altered sleeping 0  Tired, decreased energy 0  Change in appetite 0  Feeling bad or failure about yourself  0  Trouble concentrating 0  Moving slowly or fidgety/restless 0  Suicidal thoughts 0  PHQ-9 Score 0  Difficult doing work/chores Not difficult at all    Quality of Life:   Personal Goals: Goals established at orientation with interventions provided to work toward goal.  Personal Goals and Risk Factors at Admission - 04/24/23 0928       Core Components/Risk Factors/Patient Goals on Admission    Weight Management Yes;Weight  Gain    Intervention Weight Management: Develop a combined nutrition and exercise program designed to reach desired caloric intake, while maintaining appropriate intake of nutrient and fiber, sodium and fats, and appropriate energy expenditure required for the weight goal.;Weight Management: Provide education and appropriate resources to help participant work on and attain dietary goals.    Expected Outcomes Weight Gain: Understanding of general recommendations for a high calorie, high protein meal plan that promotes weight gain by distributing calorie intake throughout the day with the consumption for 4-5 meals, snacks, and/or supplements;Understanding of distribution of calorie intake throughout the day with the consumption of 4-5 meals/snacks;Understanding recommendations for meals to include 15-35% energy as protein, 25-35% energy from fat, 35-60% energy from carbohydrates, less than 200mg  of  dietary cholesterol, 20-35 gm of total fiber daily;Short Term: Continue to assess and modify interventions until short term weight is achieved;Long Term: Adherence to nutrition and physical activity/exercise program aimed toward attainment of established weight goal    Improve shortness of breath with ADL's Yes    Intervention Provide education, individualized exercise plan and daily activity instruction to help decrease symptoms of SOB with activities of daily living.    Expected Outcomes Short Term: Improve cardiorespiratory fitness to achieve a reduction of symptoms when performing ADLs;Long Term: Be able to perform more ADLs without symptoms or delay the onset of symptoms              Personal Goals Discharge:  Goals and Risk Factor Review     Row Name 04/26/23 1552 05/24/23 1226 06/19/23 1244 07/19/23 0917       Core Components/Risk Factors/Patient Goals Review   Personal Goals Review Weight Management/Obesity;Develop more efficient breathing techniques such as purse lipped breathing and diaphragmatic breathing and practicing self-pacing with activity.;Improve shortness of breath with ADL's Weight Management/Obesity;Develop more efficient breathing techniques such as purse lipped breathing and diaphragmatic breathing and practicing self-pacing with activity.;Improve shortness of breath with ADL's Weight Management/Obesity;Develop more efficient breathing techniques such as purse lipped breathing and diaphragmatic breathing and practicing self-pacing with activity.;Improve shortness of breath with ADL's Weight Management/Obesity;Improve shortness of breath with ADL's    Review Unable to evaluate any progress towards goals yet. Skip is scheduled to begin on 7/9. Goal progressing for weight gain. Skip is working with staff dietician to obtain weight gain goal. Goal progressing for improve shortness of breath with ADL's. Goal progressing on developing more efficient breathing techniques such as  purse lipped breathing and diaphragmatic breathing. Goal not progressing for weight gain. Skip has not gained any weight since starting the program. Skip and his sister are working with our staff dietician to gain weight. He is eating a renal diet. Goal progressing for improving shortness of breath with ADL's. We have weaned Skip off his oxygen since starting the program. He still uses oxygen at night. O2 saturations have been > 88% while exercising. Goal met on developing more efficient breathing techniques such as purse lipped breathing and diaphragmatic breathing. Skip can initiate PLB on when he is short of breath while exercising. He states he has been practicing diaphragmatic breathing at home. Skip is enjoying the class and stated he can see the progress. Skip had to be discharged from the program on 07/26/23 after completing 19 sessions. Skip is currently hospitalized for bilateral pneumonia, and his plan is to go to skilled nursing after discharge. Core components/risk factors/patient goals are as follows: Goal not met for weight gain. Skip is down ~2.5# since orientation. Our dietician has  spoken to both Skip and his sister for weight gain advice. Due to his recent illness, Skip had decreased appetite. Goal not met for improving shortness of breath with ADL's. We had weaned Skip off his oxygen during class, but due to his recent pleural effusion, thoracentesis, and bilateral pneumonia he had to be placed back on oxygen.    Expected Outcomes For Skip to gain weight, improve his shortness of breath with ADLs, and develop more efficient breathing techniques. See admission goals For Skip to gain weight, improve his shortness of breath with his ADLs & develop more efficient breathing techniques For Skip to gain weight and improve SOB with his ADL's post pulmonary rehab             Exercise Goals and Review:  Exercise Goals     Row Name 04/24/23 0936 04/28/23 1052 05/19/23 1039 06/19/23 1228        Exercise Goals   Increase Physical Activity Yes Yes Yes Yes    Intervention Provide advice, education, support and counseling about physical activity/exercise needs.;Develop an individualized exercise prescription for aerobic and resistive training based on initial evaluation findings, risk stratification, comorbidities and participant's personal goals. Provide advice, education, support and counseling about physical activity/exercise needs.;Develop an individualized exercise prescription for aerobic and resistive training based on initial evaluation findings, risk stratification, comorbidities and participant's personal goals. Provide advice, education, support and counseling about physical activity/exercise needs.;Develop an individualized exercise prescription for aerobic and resistive training based on initial evaluation findings, risk stratification, comorbidities and participant's personal goals. Provide advice, education, support and counseling about physical activity/exercise needs.;Develop an individualized exercise prescription for aerobic and resistive training based on initial evaluation findings, risk stratification, comorbidities and participant's personal goals.    Expected Outcomes Short Term: Attend rehab on a regular basis to increase amount of physical activity.;Long Term: Exercising regularly at least 3-5 days a week.;Long Term: Add in home exercise to make exercise part of routine and to increase amount of physical activity. Short Term: Attend rehab on a regular basis to increase amount of physical activity.;Long Term: Exercising regularly at least 3-5 days a week.;Long Term: Add in home exercise to make exercise part of routine and to increase amount of physical activity. Short Term: Attend rehab on a regular basis to increase amount of physical activity.;Long Term: Exercising regularly at least 3-5 days a week.;Long Term: Add in home exercise to make exercise part of routine and to increase  amount of physical activity. Short Term: Attend rehab on a regular basis to increase amount of physical activity.;Long Term: Exercising regularly at least 3-5 days a week.;Long Term: Add in home exercise to make exercise part of routine and to increase amount of physical activity.    Increase Strength and Stamina Yes Yes Yes Yes    Intervention Provide advice, education, support and counseling about physical activity/exercise needs.;Develop an individualized exercise prescription for aerobic and resistive training based on initial evaluation findings, risk stratification, comorbidities and participant's personal goals. Provide advice, education, support and counseling about physical activity/exercise needs.;Develop an individualized exercise prescription for aerobic and resistive training based on initial evaluation findings, risk stratification, comorbidities and participant's personal goals. Provide advice, education, support and counseling about physical activity/exercise needs.;Develop an individualized exercise prescription for aerobic and resistive training based on initial evaluation findings, risk stratification, comorbidities and participant's personal goals. Provide advice, education, support and counseling about physical activity/exercise needs.;Develop an individualized exercise prescription for aerobic and resistive training based on initial evaluation findings, risk stratification,  comorbidities and participant's personal goals.    Expected Outcomes Short Term: Increase workloads from initial exercise prescription for resistance, speed, and METs.;Short Term: Perform resistance training exercises routinely during rehab and add in resistance training at home;Long Term: Improve cardiorespiratory fitness, muscular endurance and strength as measured by increased METs and functional capacity ( ) Short Term: Increase workloads from initial exercise prescription for resistance, speed, and METs.;Short  Term: Perform resistance training exercises routinely during rehab and add in resistance training at home;Long Term: Improve cardiorespiratory fitness, muscular endurance and strength as measured by increased METs and functional capacity ( ) Short Term: Increase workloads from initial exercise prescription for resistance, speed, and METs.;Short Term: Perform resistance training exercises routinely during rehab and add in resistance training at home;Long Term: Improve cardiorespiratory fitness, muscular endurance and strength as measured by increased METs and functional capacity ( ) Short Term: Increase workloads from initial exercise prescription for resistance, speed, and METs.;Short Term: Perform resistance training exercises routinely during rehab and add in resistance training at home;Long Term: Improve cardiorespiratory fitness, muscular endurance and strength as measured by increased METs and functional capacity ( )    Able to understand and use rate of perceived exertion (RPE) scale Yes Yes Yes Yes    Intervention Provide education and explanation on how to use RPE scale Provide education and explanation on how to use RPE scale Provide education and explanation on how to use RPE scale Provide education and explanation on how to use RPE scale    Expected Outcomes Short Term: Able to use RPE daily in rehab to express subjective intensity level;Long Term:  Able to use RPE to guide intensity level when exercising independently Short Term: Able to use RPE daily in rehab to express subjective intensity level;Long Term:  Able to use RPE to guide intensity level when exercising independently Short Term: Able to use RPE daily in rehab to express subjective intensity level;Long Term:  Able to use RPE to guide intensity level when exercising independently Short Term: Able to use RPE daily in rehab to express subjective intensity level;Long Term:  Able to use RPE to guide intensity level when exercising  independently    Able to understand and use Dyspnea scale Yes Yes Yes Yes    Intervention Provide education and explanation on how to use Dyspnea scale Provide education and explanation on how to use Dyspnea scale Provide education and explanation on how to use Dyspnea scale Provide education and explanation on how to use Dyspnea scale    Expected Outcomes Short Term: Able to use Dyspnea scale daily in rehab to express subjective sense of shortness of breath during exertion;Long Term: Able to use Dyspnea scale to guide intensity level when exercising independently Short Term: Able to use Dyspnea scale daily in rehab to express subjective sense of shortness of breath during exertion;Long Term: Able to use Dyspnea scale to guide intensity level when exercising independently Short Term: Able to use Dyspnea scale daily in rehab to express subjective sense of shortness of breath during exertion;Long Term: Able to use Dyspnea scale to guide intensity level when exercising independently Short Term: Able to use Dyspnea scale daily in rehab to express subjective sense of shortness of breath during exertion;Long Term: Able to use Dyspnea scale to guide intensity level when exercising independently    Knowledge and understanding of Target Heart Rate Range (THRR) Yes Yes Yes Yes    Intervention Provide education and explanation of THRR including how the numbers were predicted and where they are located for  reference Provide education and explanation of THRR including how the numbers were predicted and where they are located for reference Provide education and explanation of THRR including how the numbers were predicted and where they are located for reference Provide education and explanation of THRR including how the numbers were predicted and where they are located for reference    Expected Outcomes Short Term: Able to state/look up THRR;Long Term: Able to use THRR to govern intensity when exercising independently;Short  Term: Able to use daily as guideline for intensity in rehab Short Term: Able to state/look up THRR;Long Term: Able to use THRR to govern intensity when exercising independently;Short Term: Able to use daily as guideline for intensity in rehab Short Term: Able to state/look up THRR;Long Term: Able to use THRR to govern intensity when exercising independently;Short Term: Able to use daily as guideline for intensity in rehab Short Term: Able to state/look up THRR;Long Term: Able to use THRR to govern intensity when exercising independently;Short Term: Able to use daily as guideline for intensity in rehab    Understanding of Exercise Prescription Yes Yes Yes Yes    Intervention Provide education, explanation, and written materials on patient's individual exercise prescription Provide education, explanation, and written materials on patient's individual exercise prescription Provide education, explanation, and written materials on patient's individual exercise prescription Provide education, explanation, and written materials on patient's individual exercise prescription    Expected Outcomes Short Term: Able to explain program exercise prescription;Long Term: Able to explain home exercise prescription to exercise independently Short Term: Able to explain program exercise prescription;Long Term: Able to explain home exercise prescription to exercise independently Short Term: Able to explain program exercise prescription;Long Term: Able to explain home exercise prescription to exercise independently Short Term: Able to explain program exercise prescription;Long Term: Able to explain home exercise prescription to exercise independently             Exercise Goals Re-Evaluation:  Exercise Goals Re-Evaluation     Row Name 04/28/23 1052 05/19/23 1039 06/19/23 1228 07/18/23 1557 07/27/23 1552     Exercise Goal Re-Evaluation   Exercise Goals Review Increase Physical Activity;Able to understand and use Dyspnea  scale;Understanding of Exercise Prescription;Increase Strength and Stamina;Knowledge and understanding of Target Heart Rate Range (THRR);Able to understand and use rate of perceived exertion (RPE) scale Increase Physical Activity;Able to understand and use Dyspnea scale;Understanding of Exercise Prescription;Increase Strength and Stamina;Knowledge and understanding of Target Heart Rate Range (THRR);Able to understand and use rate of perceived exertion (RPE) scale Increase Physical Activity;Able to understand and use Dyspnea scale;Understanding of Exercise Prescription;Increase Strength and Stamina;Knowledge and understanding of Target Heart Rate Range (THRR);Able to understand and use rate of perceived exertion (RPE) scale Increase Physical Activity;Able to understand and use Dyspnea scale;Understanding of Exercise Prescription;Increase Strength and Stamina;Knowledge and understanding of Target Heart Rate Range (THRR);Able to understand and use rate of perceived exertion (RPE) scale Increase Physical Activity;Able to understand and use Dyspnea scale;Understanding of Exercise Prescription;Increase Strength and Stamina;Knowledge and understanding of Target Heart Rate Range (THRR);Able to understand and use rate of perceived exertion (RPE) scale   Comments Skip is scheduled to begin exercise next week. Will continue to monitor and progress as able. Skip has completed 5 exercise sessions. He exercises for 15 min on the Nustep and track. Pt averages 2.7 METs at level 3 on the Nustep and 2.23 METs on the track. Skip performs the warmup and cooldown standing/ seated dependent on his shortness of breath. He has increased his workload for  the Nustep as METs have slightly increased. It is too soon to notate any major progressions. Will continue to monitor and progress as able. Skip has completed 13 exercise sessions. He exercises for 15 min on the Nustep and track. Pt averages 3.4 METs at level 3 on the Nustep and 2.08 METs  on the track. Skip performs the warmup and cooldown standing/ seated dependent on his shortness of breath. His METs have increased for the Nustep and have remained the same for the track. Skip has most likely reached his functional capacity. Skip sits less during the warmup and cooldown. He feels that he have improved since starting PR. Skip has completed 19 exercise sessions. He exercises for 15 min on the Nustep and track. Pt averages 2.7 METs at level 3 on the Nustep and 1.77 METs on the track. Skip performs the warmup and cooldown standing/ seated dependent on his shortness of breath. Skip's METs have remained relatively the same because he has had other health issues and had to miss class. He is motivated to exercise as he tried to exercise at his best ability today. We have discussed home exercise. Skip is trying exercise at home as often as he can. Will continue to monitor and progress as able. Skip completed 19 exercise sessions. Peak METs were 3.4 on the Nustep and 1.77 on the track. He is being discharged due to hospitalization.   Expected Outcomes Through exercise at rehab and home, the patient will decrease shortness of breath with daily activities and feel confident in carrying out an exercise regimen at home. Through exercise at rehab and home, the patient will decrease shortness of breath with daily activities and feel confident in carrying out an exercise regimen at home. Through exercise at rehab and home, the patient will decrease shortness of breath with daily activities and feel confident in carrying out an exercise regimen at home. Through exercise at rehab and home, the patient will decrease shortness of breath with daily activities and feel confident in carrying out an exercise regimen at home. Through exercise at rehab and home, the patient will decrease shortness of breath with daily activities and feel confident in carrying out an exercise regimen at home.            Nutrition &  Weight - Outcomes:  Pre Biometrics - 04/24/23 0914       Pre Biometrics   Grip Strength 29 kg              Nutrition:  Nutrition Therapy & Goals - 07/25/23 1140       Nutrition Therapy   Diet Heart Healthy Diet    Drug/Food Interactions Statins/Certain Fruits      Personal Nutrition Goals   Nutrition Goal Patient to identify strategies for weight maintenance/ weight gain of 0.5-2.0# per week.   goal in progress.   Comments Goals in progress. Skip continues regular follow-up for kidney transplant; he has required dialysis since heart transplant in 2021. He currently receives hemodialysis M/W/F. His weight has been stable since hospitalization related to pneumonia in May 2024, and per diaylsis notes, his typical weight has been ~60kg. He is down about 3.5# since orientation to our program (start weight was 61.1kg). He lives with his sister who is a great support and does all of the grocery shopping and cooking. Skip reports drinking 1 Boost Breeze daily (250kcals, 9g protein) and 1 [employer] Milkshake (580kcals, 13g protein) daily to aid with weight gain/weight maintenance. We have discussed multiple strategies  for weight gain including increasing eating frequency, protein supplements, increasing calories from fat/protein/carbohydrates, etc. He is currently hospitalized for acute respiratory failure; will continue to monitor goals upon patient's return. Patient will benefit from participation in pulmonary rehab for nutrition, exercise, and lifestyle modification.      Intervention Plan   Intervention Prescribe, educate and counsel regarding individualized specific dietary modifications aiming towards targeted core components such as weight, hypertension, lipid management, diabetes, heart failure and other comorbidities.;Nutrition handout(s) given to patient.    Expected Outcomes Long Term Goal: Adherence to prescribed nutrition plan.;Short Term Goal: Understand basic principles of dietary  content, such as calories, fat, sodium, cholesterol and nutrients.             Nutrition Discharge:  Nutrition Assessments - 05/02/23 1113       Rate Your Plate Scores   Pre Score 62             Education Questionnaire Score:  Knowledge Questionnaire Score - 04/24/23 0924       Knowledge Questionnaire Score   Pre Score 18/18            Skip had to be discharged from the program on 07/26/23. His sister called [place] and stated he was hospitalized for bilateral pneumonia. He has been transferred from [facility] to [facility]. Skip completed 19 sessions of Pulm Rehab. Skip never complained while in class, worked hard, even when he didn't feel well. Skip denied any psychosocial issues or concerns at his last re-evaluation. He has great support from his sister.   Core components/risk factors/patient goals are as follows: Goal not met for weight gain. Skip is down ~2.5# since orientation. Our dietician has spoken to both Skip and his sister for weight gain advice. Due to his recent illness, Skip had decreased appetite. Goal not met for improving shortness of breath with ADL's. We had weaned Skip off his oxygen during class, but due to his recent pleural effusion, thoracentesis, and bilateral pneumonia he had to be placed back on oxygen.   Goals reviewed with patient; copy given to patient.

## 2023-08-01 ENCOUNTER — Ambulatory Visit (HOSPITAL_COMMUNITY): Payer: Medicare Other

## 2023-08-03 ENCOUNTER — Ambulatory Visit (HOSPITAL_COMMUNITY): Payer: Medicare Other

## 2023-08-03 DIAGNOSIS — J986 Disorders of diaphragm: Principal | ICD-10-CM

## 2023-08-08 ENCOUNTER — Ambulatory Visit (HOSPITAL_COMMUNITY): Payer: Medicare Other

## 2023-08-10 ENCOUNTER — Ambulatory Visit (HOSPITAL_COMMUNITY): Payer: Medicare Other

## 2023-08-11 MED ORDER — TACROLIMUS 1 MG CAPSULE, IMMEDIATE-RELEASE
ORAL_CAPSULE | 3 refills | 0 days | Status: CN
Start: 2023-08-11 — End: ?

## 2023-08-11 MED ORDER — MIDODRINE 10 MG TABLET
ORAL_TABLET | ORAL | 0 refills | 28 days | Status: CN
Start: 2023-08-11 — End: 2023-09-10

## 2023-08-11 MED ORDER — ACETAMINOPHEN 500 MG TABLET
ORAL_TABLET | Freq: Four times a day (QID) | ORAL | 0 refills | 8 days | Status: CN | PRN
Start: 2023-08-11 — End: ?

## 2023-08-11 MED ORDER — TACROLIMUS 5 MG CAPSULE, IMMEDIATE-RELEASE
ORAL_CAPSULE | 3 refills | 0 days | Status: CN
Start: 2023-08-11 — End: ?

## 2023-08-15 ENCOUNTER — Ambulatory Visit (HOSPITAL_COMMUNITY): Payer: Medicare Other

## 2023-08-17 ENCOUNTER — Ambulatory Visit (HOSPITAL_COMMUNITY): Payer: Medicare Other

## 2023-08-18 ENCOUNTER — Telehealth: Payer: Self-pay | Admitting: Pulmonary Disease

## 2023-08-18 NOTE — Telephone Encounter (Signed)
Contacted by colleague at [facility], patient requested to see me in follow-up.  Self-referral for dyspnea on exertion.  Looks like he has appointment with Dr. Vassie Loll next month.  Can we make sure Dr. Vassie Loll is okay with the switch and if so schedule patient to see me within the next month, okay to double book at 8:30 AM or 1 PM slot.  Thank you.

## 2023-08-21 NOTE — Telephone Encounter (Signed)
Patient has been moved to Dr. Jodene Nam schedule. Made patient aware of location change of the appointment. Nothing further needed.

## 2023-08-21 NOTE — Telephone Encounter (Deleted)
He was already scheduled with Dr. Vassie Loll - no need to make any switch.

## 2023-08-21 NOTE — Telephone Encounter (Signed)
This is a patient request and never been seen in office, do we need to wait for Dr. Vassie Loll?

## 2023-08-22 ENCOUNTER — Ambulatory Visit (HOSPITAL_COMMUNITY): Payer: Medicare Other

## 2023-08-22 ENCOUNTER — Ambulatory Visit: Admit: 2023-08-22 | Discharge: 2023-08-23 | Payer: MEDICARE

## 2023-08-22 ENCOUNTER — Ambulatory Visit: Admit: 2023-08-22 | Discharge: 2023-08-23 | Payer: MEDICARE | Attending: Internal Medicine | Primary: Internal Medicine

## 2023-08-22 DIAGNOSIS — J986 Disorders of diaphragm: Principal | ICD-10-CM

## 2023-08-22 DIAGNOSIS — Z941 Heart transplant status: Principal | ICD-10-CM

## 2023-08-22 DIAGNOSIS — R0602 Shortness of breath: Principal | ICD-10-CM

## 2023-08-23 DIAGNOSIS — J849 Interstitial pulmonary disease, unspecified: Principal | ICD-10-CM

## 2023-08-23 DIAGNOSIS — R0602 Shortness of breath: Principal | ICD-10-CM

## 2023-08-24 ENCOUNTER — Telehealth (HOSPITAL_COMMUNITY): Payer: Self-pay

## 2023-08-24 NOTE — Telephone Encounter (Signed)
Pt insurance is active and benefits verified through [employer] Co-pay 0, DED 240/240 met, out of pocket 0/0 met, co-insurance 20%. no pre-authorization required. 08/24/2023@312      Pt insurance is active and benefits verified through [facility] Co-pay 0, DED 0/0 met, out of pocket 0/0 met, co-insurance 0%. no pre-authorization required. 08/24/2023@312 

## 2023-08-24 NOTE — Telephone Encounter (Signed)
Outside/paper referral received by Dr. Farley Ly from [facility]. Insurance benefits and eligibility to be determined.

## 2023-08-24 NOTE — Telephone Encounter (Signed)
Called patient to see if he is interested in the Pulmonary Rehab Program. Patient expressed interest. Explained scheduling process and went over insurance, patient verbalized understanding. Someone from our Pulmonary rehab staff will contact pt at a later time.

## 2023-08-28 ENCOUNTER — Encounter (HOSPITAL_COMMUNITY): Payer: Self-pay

## 2023-08-28 ENCOUNTER — Telehealth (HOSPITAL_COMMUNITY): Payer: Self-pay

## 2023-08-28 NOTE — Telephone Encounter (Signed)
Attempted to call patient in regards to Pulmonary Rehab - LM on VM Mailed letter 

## 2023-08-29 MED ORDER — AMLODIPINE 10 MG TABLET
ORAL_TABLET | Freq: Every day | ORAL | 3 refills | 90 days | Status: CP
Start: 2023-08-29 — End: 2024-08-28

## 2023-08-30 DIAGNOSIS — Z941 Heart transplant status: Principal | ICD-10-CM

## 2023-09-01 ENCOUNTER — Encounter
Admit: 2023-09-01 | Discharge: 2023-09-01 | Payer: MEDICARE | Attending: Cardiovascular Disease | Primary: Cardiovascular Disease

## 2023-09-01 ENCOUNTER — Ambulatory Visit
Admit: 2023-09-01 | Discharge: 2023-09-01 | Payer: MEDICARE | Attending: Cardiovascular Disease | Primary: Cardiovascular Disease

## 2023-09-01 ENCOUNTER — Ambulatory Visit: Admit: 2023-09-01 | Discharge: 2023-09-01 | Payer: MEDICARE

## 2023-09-01 DIAGNOSIS — E785 Hyperlipidemia, unspecified: Principal | ICD-10-CM

## 2023-09-01 DIAGNOSIS — Z79899 Other long term (current) drug therapy: Principal | ICD-10-CM

## 2023-09-01 DIAGNOSIS — Z125 Encounter for screening for malignant neoplasm of prostate: Principal | ICD-10-CM

## 2023-09-01 DIAGNOSIS — Z941 Heart transplant status: Principal | ICD-10-CM

## 2023-09-01 DIAGNOSIS — E559 Vitamin D deficiency, unspecified: Principal | ICD-10-CM

## 2023-09-01 MED ORDER — HYDRALAZINE 25 MG TABLET
ORAL_TABLET | Freq: Four times a day (QID) | ORAL | 11 refills | 30 days | Status: CP | PRN
Start: 2023-09-01 — End: 2024-08-31

## 2023-09-11 ENCOUNTER — Telehealth (HOSPITAL_COMMUNITY): Payer: Self-pay

## 2023-09-11 NOTE — Telephone Encounter (Signed)
No response from pt.  Closed referral  

## 2023-09-12 ENCOUNTER — Ambulatory Visit: Payer: Medicare Other | Admitting: Pulmonary Disease

## 2023-09-12 ENCOUNTER — Institutional Professional Consult (permissible substitution) (HOSPITAL_BASED_OUTPATIENT_CLINIC_OR_DEPARTMENT_OTHER): Payer: Medicare Other | Admitting: Pulmonary Disease

## 2023-09-12 ENCOUNTER — Encounter: Payer: Self-pay | Admitting: Pulmonary Disease

## 2023-09-12 VITALS — BP 128/80 | HR 113 | Ht 68.0 in | Wt 125.2 lb

## 2023-09-12 DIAGNOSIS — J9612 Chronic respiratory failure with hypercapnia: Secondary | ICD-10-CM

## 2023-09-12 NOTE — Progress Notes (Signed)
@Patient  ID: Rickey Erickson, male    DOB: Jul 10, 1956, 67 y.o.   MRN: [id number]  Chief Complaint  Patient presents with   Consult    Consult visit. Pt c/o SOB on exertion.     Referring provider: [facility], *  HPI:   67 y.o. male with past medical history significant for coronary artery disease status post heart transplantation and ESRD on dialysis whom are seen for evaluation of chronic hypercapnic respite failure currently using NIPPV via ventilator with facemask at home after recent hospitalization.  Multiple pulmonary notes from [facility] health reviewed.  Multiple pulmonary and cardiology notes from [facility] reviewed.  Noticed increasing respiratory symptoms of late September 2024.  Oxygen saturation is not quite as good.  More dyspneic.  Not feeling well.  Present to the ED.  Diagnosed with pneumonia.  Pleural effusion was present.  Loculated.  This led to chest tube placement.  Persisted.  Intrapleural lytics were administered.  Was not really improving adequately, not quickly enough per family member, and patient.  Requested transfer [facility], transplant center.  They are additional chest tubes were placed.  Got normal dialysis sessions.  No increased sessions.  Unclear if additional ultrafiltrate was removed.  Took a while but gradually improved.  He was noted at coming in at [facility] to be persistently hypercarbic.  Even despite NIPPV via traditional BiPAP and CPAP.  This prompted discharge home with ventilator in backup mode.  He is using.  Anytime he sleeps.  At night and with naps.  We discussed continuing the same.  He needs ongoing care to help manage this issue as well with equipment etc.  As well as a local contact for respiratory complaints or shortness of breath.   Questionaires / Pulmonary Flowsheets:   ACT:      No data to display          MMRC:     No data to display          Epworth:      No data to display          Tests:   FENO:  No results found for:  "NITRICOXIDE"  PFT:     No data to display          WALK:     04/24/2023   11:12 AM  SIX MIN WALK  2 Minute Oxygen Saturation % 97 %  2 Minute HR 118  4 Minute Oxygen Saturation % 97 %  4 Minute HR 123  6 Minute Oxygen Saturation % 96 %  6 Minute HR 124    Imaging: Personally reviewed and as per EMR and discussion in this note No results found.  Lab Results: Personally reviewed CBC    Component Value Date/Time   WBC 4.9 07/27/2023 1309   RBC 3.51 (L) 07/27/2023 1309   HGB 9.9 (L) 07/27/2023 1309   HCT 34.6 (L) 07/27/2023 1309   PLT 147 (L) 07/27/2023 1309   MCV 98.6 07/27/2023 1309   MCH 28.2 07/27/2023 1309   MCHC 28.6 (L) 07/27/2023 1309   RDW 14.6 07/27/2023 1309   LYMPHSABS 0.4 (L) 07/21/2023 0445   MONOABS 0.9 07/21/2023 0445   EOSABS 0.1 07/21/2023 0445   BASOSABS 0.0 07/21/2023 0445    BMET    Component Value Date/Time   NA 133 (L) 07/27/2023 0927   K 4.3 07/27/2023 0927   CL 94 (L) 07/27/2023 0927   CO2 25 07/27/2023 0927   GLUCOSE 65 (L) 07/27/2023 6578  BUN 22 07/27/2023 0927   CREATININE 3.92 (H) 07/27/2023 0927   CALCIUM 7.0 (L) 07/27/2023 0927   GFRNONAA 16 (L) 07/27/2023 0927   GFRAA >60 06/18/2018 1005    BNP    Component Value Date/Time   BNP 496.6 (H) 03/07/2023 1628    ProBNP No results found for: "PROBNP"  Specialty Problems       Pulmonary Problems   Acute respiratory failure with hypoxia (HCC)   Pleural effusion on right   Community acquired pneumonia of right lower lobe of lung   Snoring   Respiratory failure (HCC)    Allergies  Allergen Reactions   Transderm-Scop [Scopolamine] Other (See Comments)    Hallucinations Induces severe delirium    Nsaids Other (See Comments)    Kidney impact   Seroquel [Quetiapine] Other (See Comments)    akathesia   Ativan [Lorazepam] Other (See Comments)    Hallucinations Severe somnolence/delirium     Immunization History  Administered Date(s) Administered    PFIZER(Purple Top)SARS-COV-2 Vaccination 01/09/2020, 01/30/2020, 07/28/2020, 12/29/2020   Pfizer Covid-19 Vaccine Bivalent Booster 44yrs & up 08/31/2021   Pfizer(Comirnaty)Fall Seasonal Vaccine 12 years and older 07/28/2022, 08/22/2023    Past Medical History:  Diagnosis Date   Anemia    Heart failure (HCC)    Hypertension    Osteoporosis    Renal disorder     Tobacco History: Social History   Tobacco Use  Smoking Status Never  Smokeless Tobacco Never   Counseling given: Not Answered   Continue to not smoke  Outpatient Encounter Medications as of 09/12/2023  Medication Sig   acetaminophen (TYLENOL) 500 MG tablet Take 500 mg by mouth every 6 (six) hours as needed for headache (pain).   albumin human 25 % bottle Inject 100 mLs (25 g total) into the vein as needed (Give during dialysis for SBP <100.).   apixaban (ELIQUIS) 2.5 MG TABS tablet Take 2.5 mg by mouth 2 (two) times daily.   atorvastatin (LIPITOR) 20 MG tablet Take 20 mg by mouth every morning.   CALCIUM PO Take 1 tablet by mouth 2 (two) times daily.   clopidogrel (PLAVIX) 75 MG tablet Take 75 mg by mouth every morning.   Darbepoetin Alfa (ARANESP) 150 MCG/0.3ML SOSY injection Inject 0.3 mLs (150 mcg total) into the skin every Wednesday at 6 PM.   midodrine (PROAMATINE) 10 MG tablet Take 10 mg by mouth See admin instructions. Take 10 mg once daily prior to dialysis on Monday, Wednesday, Friday.   mirtazapine (REMERON) 30 MG tablet Take 30 mg by mouth at bedtime.   pantoprazole (PROTONIX) 40 MG tablet Take 40 mg by mouth every morning.   tacrolimus (PROGRAF) 1 MG capsule Take 1 mg by mouth 2 (two) times daily. Take in addition to 5 mg capsule twice daily, for a total dose of 6 mg twice daily.   tacrolimus (PROGRAF) 5 MG capsule Take 5 mg by mouth 2 (two) times daily. Take in addition to 1 mg capsule twice daily, for a total dose of 6 mg twice daily.   [DISCONTINUED] albuterol (VENTOLIN HFA) 108 (90 Base) MCG/ACT inhaler  Inhale 2 puffs into the lungs every 6 (six) hours as needed for wheezing or shortness of breath. (Patient not taking: Reported on 09/12/2023)   [DISCONTINUED] arformoterol (BROVANA) 15 MCG/2ML NEBU Take 2 mLs (15 mcg total) by nebulization 2 (two) times daily.   [DISCONTINUED] mycophenolate (MYFORTIC) 180 MG EC tablet Take 180 mg by mouth 2 (two) times daily.   [DISCONTINUED] revefenacin (  YUPELRI) 175 MCG/3ML nebulizer solution Take 3 mLs (175 mcg total) by nebulization daily.   No facility-administered encounter medications on file as of 09/12/2023.     Review of Systems  Review of Systems  No chest pain with exertion.  No orthopnea or PND.  Comprehensive review of systems otherwise negative. Physical Exam  BP 128/80 (BP Location: Left Arm, Cuff Size: Normal)   Pulse (!) 113   Ht 5\' 8"  (1.727 m)   Wt 125 lb 3.2 oz (56.8 kg)   SpO2 98%   BMI 19.04 kg/m   Wt Readings from Last 5 Encounters:  09/12/23 125 lb 3.2 oz (56.8 kg)  07/26/23 149 lb 7.6 oz (67.8 kg)  07/11/23 131 lb 13.4 oz (59.8 kg)  06/27/23 132 lb 11.5 oz (60.2 kg)  06/13/23 131 lb 9.8 oz (59.7 kg)    BMI Readings from Last 5 Encounters:  09/12/23 19.04 kg/m  07/26/23 22.73 kg/m  07/11/23 20.05 kg/m  06/27/23 20.18 kg/m  06/13/23 20.01 kg/m     Physical Exam General: Sitting in chair, no acute distress Eyes: EOMI, no icterus Neck: Supple, no JVP Pulmonary: Clear, good air excursion, normal work of breathing Cardiovascular: Warm, no edema Abdomen: Nondistended bowel sounds present MSK: No synovitis, no joint effusion Neuro: Normal gait, no weakness Psych: Normal mood, full affect   Assessment & Plan:   Chronic nocturnal hypoxemia and hypoxic respiratory failure: On AVAPS at night due to significant hypercarbia documented on multiple blood gases.  Suspect hypoxemia largely due to intermittent fluid overload.  Areas of bronchiectasis on imaging as well.  Continue AVAPS, current setting.  2 L bleed in.   Will need formal sleep study, AVAPS complicates things.  Likely split night with BiPAP titration.  Will arrange with sleep colleagues.  Shortness of breath/dyspnea on exertion: Largely exacerbated by fluid accumulation with inability to regulate fluid retention as ESRD on HD.  Serial images in the past demonstrate signs of pulmonary venous hypertension and volume overload pleural effusions edema etc.  Recent admission for pneumonia.  This is improving.  Suspect element of deconditioning with prolonged hospitalization as well.  Encouraged to reengage in pulmonary rehab.   Return in about 3 months (around 12/13/2023) for f/u Dr. Judeth Horn.   Karren Burly, MD 09/15/2023   This appointment required 45 minutes of patient care (this includes precharting, chart review, review of results, face-to-face care, etc.).

## 2023-09-12 NOTE — Patient Instructions (Signed)
We will order a sleep study  Keep using 2L with the AVAPs machine/ventilator  Please wear the mask and use the machine anytime you plan to sleep  Contact me if I can help in any way, any worsening shortness of breath or any concerns as relates to breathing or anything else  Return to clinic in 3 months or sooner as needed with Dr. Judeth Horn

## 2023-09-15 ENCOUNTER — Telehealth (HOSPITAL_COMMUNITY): Payer: Self-pay

## 2023-09-15 NOTE — Telephone Encounter (Signed)
Pt returned PR phone call and stated he is interested in PR.  Patient will come in for orientation on 09/18/23 @ 8AM and will attend the 8:15AM exercise class.   Pensions consultant.

## 2023-09-18 ENCOUNTER — Encounter (HOSPITAL_COMMUNITY)
Admission: RE | Admit: 2023-09-18 | Discharge: 2023-09-18 | Disposition: A | Discharge status: Home / Self Care | Payer: Medicare Other | Source: Ambulatory Visit | Attending: Pulmonary Disease | Admitting: Pulmonary Disease

## 2023-09-18 ENCOUNTER — Encounter (HOSPITAL_COMMUNITY): Payer: Self-pay

## 2023-09-18 VITALS — BP 130/68 | HR 114 | Ht 67.0 in | Wt 119.7 lb

## 2023-09-18 DIAGNOSIS — J849 Interstitial pulmonary disease, unspecified: Secondary | ICD-10-CM | POA: Insufficient documentation

## 2023-09-18 NOTE — Progress Notes (Signed)
Pulmonary Rehab Orientation Physical Assessment Note  Physical assessment reveals  Pt is alert and oriented x 3.  Heart rate is tachycardic, breath sounds diminished throughout. Reports non-productive cough. Bowel sounds present.   Pt denies/endorses abdominal discomfort, nausea, vomiting or diarrhea. Grip strength equal, strong. Distal pulses palpable; no swelling to lower extremities.  Alanson Aly, BSN Cardiac and Emergency planning/management officer

## 2023-09-18 NOTE — Progress Notes (Signed)
Pulmonary Individual Treatment Plan  Patient Details  Name: Rickey Erickson MRN: [id number] Date of Birth: [birth year] Referring Provider:   [name] Pulmonary Rehab Walk Test from 09/18/2023 in [facility]  Referring Provider [name]       Initial Encounter Date:  Flowsheet Row Pulmonary Rehab Walk Test from 09/18/2023 in [facility]  Date 09/18/23       Visit Diagnosis: ILD (interstitial lung disease) (HCC)  Patient's Home Medications on Admission:   Current Outpatient Medications:    acetaminophen (TYLENOL) 500 MG tablet, Take 500 mg by mouth every 6 (six) hours as needed for headache (pain)., Disp: , Rfl:    albumin human 25 % bottle, Inject 100 mLs (25 g total) into the vein as needed (Give during dialysis for SBP <100.)., Disp: , Rfl:    apixaban (ELIQUIS) 2.5 MG TABS tablet, Take 2.5 mg by mouth 2 (two) times daily., Disp: , Rfl:    atorvastatin (LIPITOR) 20 MG tablet, Take 20 mg by mouth every morning., Disp: , Rfl:    CALCIUM PO, Take 1 tablet by mouth 2 (two) times daily., Disp: , Rfl:    clopidogrel (PLAVIX) 75 MG tablet, Take 75 mg by mouth every morning., Disp: , Rfl:    Darbepoetin Alfa (ARANESP) 150 MCG/0.3ML SOSY injection, Inject 0.3 mLs (150 mcg total) into the skin every Wednesday at 6 PM., Disp: , Rfl:    midodrine (PROAMATINE) 10 MG tablet, Take 10 mg by mouth See admin instructions. Take 10 mg once daily prior to dialysis on Monday, Wednesday, Friday., Disp: , Rfl:    mirtazapine (REMERON) 30 MG tablet, Take 30 mg by mouth at bedtime., Disp: , Rfl:    pantoprazole (PROTONIX) 40 MG tablet, Take 40 mg by mouth every morning., Disp: , Rfl:    tacrolimus (PROGRAF) 1 MG capsule, Take 1 mg by mouth 2 (two) times daily. Take in addition to 5 mg capsule twice daily, for a total dose of 6 mg twice daily., Disp: , Rfl:    tacrolimus (PROGRAF) 5 MG capsule,  Take 5 mg by mouth 2 (two) times daily. Take in addition to 1 mg capsule twice daily, for a total dose of 6 mg twice daily., Disp: , Rfl:   Past Medical History: Past Medical History:  Diagnosis Date   Anemia    Heart failure (HCC)    Hypertension    Osteoporosis    Renal disorder     Tobacco Use: Social History   Tobacco Use  Smoking Status Never  Smokeless Tobacco Never    Labs: Review Flowsheet  More data may exist      Latest Ref Rng & Units 01/06/2022 09/08/2022 03/07/2023 07/21/2023 07/26/2023  Labs for ITP Cardiac and Pulmonary Rehab  Cholestrol 0 - 200 mg/dL 962  952  - - -  LDL (calc) 0 - 99 mg/dL 90  80  - - -  HDL-C >84 mg/dL 30  39  - - -  Trlycerides <150 mg/dL 90  86  - - -  Hemoglobin A1c 4.8 - 5.6 % 4.9  - - - -  PH, Arterial 7.35 - 7.45 - - 7.497  7.227  7.109  7.060  -  PCO2 arterial 32 - 48 mmHg - - 33.6  56.0  85.7  107.3  -  Bicarbonate 20.0 - 28.0 mmol/L - - 26.0  23.3  27.6  25.0  27.4  30.5  23.9  25.0   TCO2 22 - 32 mmol/L - - 27  25  30  27  30   34  -  Acid-base deficit 0.0 - 2.0 mmol/L - - - 5.0  4.0  6.0  4.0  3.0  6.0  4.7   O2 Saturation % - - 96  93  30  75  86  92  98.2  77.3     Details       Multiple values from one day are sorted in reverse-chronological order         Capillary Blood Glucose: Lab Results  Component Value Date   GLUCAP 80 07/27/2023   GLUCAP 92 07/21/2023   GLUCAP 86 07/21/2023     Pulmonary Assessment Scores:  Pulmonary Assessment Scores     Row Name 04/24/23 0935 09/18/23 0812       ADL UCSD   ADL Phase Entry Entry    SOB Score total 50 --      CAT Score   CAT Score 16 --      mMRC Score   mMRC Score 3 2            UCSD: Self-administered rating of dyspnea associated with activities of daily living (ADLs) 6-point scale (0 = "not at all" to 5 = "maximal or unable to do because of breathlessness")  Scoring Scores range from 0 to 120.  Minimally important difference is 5 units  CAT: CAT  can identify the health impairment of COPD patients and is better correlated with disease progression.  CAT has a scoring range of zero to 40. The CAT score is classified into four groups of low (less than 10), medium (10 - 20), high (21-30) and very high (31-40) based on the impact level of disease on health status. A CAT score over 10 suggests significant symptoms.  A worsening CAT score could be explained by an exacerbation, poor medication adherence, poor inhaler technique, or progression of COPD or comorbid conditions.  CAT MCID is 2 points  mMRC: mMRC (Modified Medical Research Council) Dyspnea Scale is used to assess the degree of baseline functional disability in patients of respiratory disease due to dyspnea. No minimal important difference is established. A decrease in score of 1 point or greater is considered a positive change.   Pulmonary Function Assessment:  Pulmonary Function Assessment - 09/18/23 0812       Breath   Bilateral Breath Sounds Decreased    Shortness of Breath Yes;Limiting activity             Exercise Target Goals: Exercise Program Goal: Individual exercise prescription set using results from initial 6 min walk test and THRR while considering  patient's activity barriers and safety.   Exercise Prescription Goal: Initial exercise prescription builds to 30-45 minutes a day of aerobic activity, 2-3 days per week.  Home exercise guidelines will be given to patient during program as part of exercise prescription that the participant will acknowledge.  Activity Barriers & Risk Stratification:  Activity Barriers & Cardiac Risk Stratification - 04/24/23 0936       Activity Barriers & Cardiac Risk Stratification   Activity Barriers Balance Concerns;Shortness of Breath;Muscular Weakness;Deconditioning   Rt arm herograft            6 Minute Walk:  6 Minute Walk     Row Name 04/24/23 1112 09/18/23 0906       6 Minute Walk   Phase Initial Initial  Distance 1015 feet 1140 feet    Walk Time 6 minutes 6 minutes    # of Rest Breaks 0 0    MPH 1.92 2.16    METS 3.36 3.68    RPE 11 13    Perceived Dyspnea  3 2    VO2 Peak 11.75 12.87    Symptoms No No    Resting HR 110 bpm 114 bpm    Resting BP 120/62 130/68    Resting Oxygen Saturation  98 % 95 %    Exercise Oxygen Saturation  during 6 min walk 95 % 92 %    Max Ex. HR 124 bpm 131 bpm    Max Ex. BP 132/64 134/68    2 Minute Post BP 122/64 122/70      Interval HR   1 Minute HR 114 122    2 Minute HR 118 128    3 Minute HR 121 129    4 Minute HR 123 128    5 Minute HR 123 131    6 Minute HR 124 131    2 Minute Post HR -- 119    Interval Heart Rate? Yes Yes      Interval Oxygen   Interval Oxygen? Yes Yes    Baseline Oxygen Saturation % 98 % 95 %    1 Minute Oxygen Saturation % 99 % 94 %    1 Minute Liters of Oxygen 2 L 0 L    2 Minute Oxygen Saturation % 97 % 94 %    2 Minute Liters of Oxygen 2 L 0 L    3 Minute Oxygen Saturation % 95 % 92 %    3 Minute Liters of Oxygen 2 L 0 L    4 Minute Oxygen Saturation % 97 % 92 %    4 Minute Liters of Oxygen 2 L 0 L    5 Minute Oxygen Saturation % 95 % 93 %    5 Minute Liters of Oxygen 2 L 0 L    6 Minute Oxygen Saturation % 96 % 92 %    6 Minute Liters of Oxygen 2 L 0 L    2 Minute Post Oxygen Saturation % 100 % 95 %    2 Minute Post Liters of Oxygen 2 L 0 L             Oxygen Initial Assessment:  Oxygen Initial Assessment - 09/18/23 0811       Home Oxygen   Home Oxygen Device None    Sleep Oxygen Prescription CPAP    Liters per minute 2    Home Exercise Oxygen Prescription None    Home Resting Oxygen Prescription None    Compliance with Home Oxygen Use Yes      Initial 6 min Walk   Oxygen Used None      Program Oxygen Prescription   Program Oxygen Prescription None      Intervention   Short Term Goals To learn and exhibit compliance with exercise, home and travel O2 prescription;To learn and understand  importance of maintaining oxygen saturations>88%;To learn and demonstrate proper use of respiratory medications;To learn and understand importance of monitoring SPO2 with pulse oximeter and demonstrate accurate use of the pulse oximeter.;To learn and demonstrate proper pursed lip breathing techniques or other breathing techniques.     Long  Term Goals Exhibits compliance with exercise, home  and travel O2 prescription;Verbalizes importance of monitoring SPO2 with pulse oximeter and return demonstration;Exhibits proper breathing techniques,  such as pursed lip breathing or other method taught during program session;Maintenance of O2 saturations>88%;Compliance with respiratory medication             Oxygen Re-Evaluation:  Oxygen Re-Evaluation     Row Name 04/28/23 1053 05/19/23 1043 06/19/23 1235 07/18/23 1601       Program Oxygen Prescription   Program Oxygen Prescription Continuous Continuous Continuous Continuous    Liters per minute 2 2 2 2       Home Oxygen   Home Oxygen Device E-Tanks;Home Concentrator E-Tanks;Home Concentrator E-Tanks;Home Concentrator E-Tanks;Home Concentrator    Sleep Oxygen Prescription Continuous Continuous Continuous Continuous    Liters per minute 1 1 1 1     Home Exercise Oxygen Prescription Continuous Continuous Continuous Continuous    Liters per minute 2 2 2 2     Home Resting Oxygen Prescription Continuous Continuous Continuous Continuous    Liters per minute 2 2 2 2     Compliance with Home Oxygen Use Yes Yes Yes Yes      Goals/Expected Outcomes   Short Term Goals To learn and exhibit compliance with exercise, home and travel O2 prescription;To learn and understand importance of maintaining oxygen saturations>88%;To learn and demonstrate proper use of respiratory medications;To learn and understand importance of monitoring SPO2 with pulse oximeter and demonstrate accurate use of the pulse oximeter.;To learn and demonstrate proper pursed lip breathing  techniques or other breathing techniques.  To learn and exhibit compliance with exercise, home and travel O2 prescription;To learn and understand importance of maintaining oxygen saturations>88%;To learn and demonstrate proper use of respiratory medications;To learn and understand importance of monitoring SPO2 with pulse oximeter and demonstrate accurate use of the pulse oximeter.;To learn and demonstrate proper pursed lip breathing techniques or other breathing techniques.  To learn and exhibit compliance with exercise, home and travel O2 prescription;To learn and understand importance of maintaining oxygen saturations>88%;To learn and demonstrate proper use of respiratory medications;To learn and understand importance of monitoring SPO2 with pulse oximeter and demonstrate accurate use of the pulse oximeter.;To learn and demonstrate proper pursed lip breathing techniques or other breathing techniques.  To learn and exhibit compliance with exercise, home and travel O2 prescription;To learn and understand importance of maintaining oxygen saturations>88%;To learn and demonstrate proper use of respiratory medications;To learn and understand importance of monitoring SPO2 with pulse oximeter and demonstrate accurate use of the pulse oximeter.;To learn and demonstrate proper pursed lip breathing techniques or other breathing techniques.     Long  Term Goals Exhibits compliance with exercise, home  and travel O2 prescription;Verbalizes importance of monitoring SPO2 with pulse oximeter and return demonstration;Exhibits proper breathing techniques, such as pursed lip breathing or other method taught during program session;Maintenance of O2 saturations>88%;Compliance with respiratory medication Exhibits compliance with exercise, home  and travel O2 prescription;Verbalizes importance of monitoring SPO2 with pulse oximeter and return demonstration;Exhibits proper breathing techniques, such as pursed lip breathing or other  method taught during program session;Maintenance of O2 saturations>88%;Compliance with respiratory medication Exhibits compliance with exercise, home  and travel O2 prescription;Verbalizes importance of monitoring SPO2 with pulse oximeter and return demonstration;Exhibits proper breathing techniques, such as pursed lip breathing or other method taught during program session;Maintenance of O2 saturations>88%;Compliance with respiratory medication Exhibits compliance with exercise, home  and travel O2 prescription;Verbalizes importance of monitoring SPO2 with pulse oximeter and return demonstration;Exhibits proper breathing techniques, such as pursed lip breathing or other method taught during program session;Maintenance of O2 saturations>88%;Compliance with respiratory medication    Goals/Expected Outcomes Compliance and understanding  of oxygen saturation monitoring and breathing techniques to decrease shortness of breath. Compliance and understanding of oxygen saturation monitoring and breathing techniques to decrease shortness of breath. Compliance and understanding of oxygen saturation monitoring and breathing techniques to decrease shortness of breath. Compliance and understanding of oxygen saturation monitoring and breathing techniques to decrease shortness of breath.             Oxygen Discharge (Final Oxygen Re-Evaluation):  Oxygen Re-Evaluation - 07/18/23 1601       Program Oxygen Prescription   Program Oxygen Prescription Continuous    Liters per minute 2      Home Oxygen   Home Oxygen Device E-Tanks;Home Concentrator    Sleep Oxygen Prescription Continuous    Liters per minute 1    Home Exercise Oxygen Prescription Continuous    Liters per minute 2    Home Resting Oxygen Prescription Continuous    Liters per minute 2    Compliance with Home Oxygen Use Yes      Goals/Expected Outcomes   Short Term Goals To learn and exhibit compliance with exercise, home and travel O2  prescription;To learn and understand importance of maintaining oxygen saturations>88%;To learn and demonstrate proper use of respiratory medications;To learn and understand importance of monitoring SPO2 with pulse oximeter and demonstrate accurate use of the pulse oximeter.;To learn and demonstrate proper pursed lip breathing techniques or other breathing techniques.     Long  Term Goals Exhibits compliance with exercise, home  and travel O2 prescription;Verbalizes importance of monitoring SPO2 with pulse oximeter and return demonstration;Exhibits proper breathing techniques, such as pursed lip breathing or other method taught during program session;Maintenance of O2 saturations>88%;Compliance with respiratory medication    Goals/Expected Outcomes Compliance and understanding of oxygen saturation monitoring and breathing techniques to decrease shortness of breath.             Initial Exercise Prescription:  Initial Exercise Prescription - 09/18/23 0900       Date of Initial Exercise RX and Referring Provider   Date 09/18/23    Referring Provider [name]    Expected Discharge Date 12/14/23      Oxygen   Maintain Oxygen Saturation 88% or higher      Recumbant Bike   Level 1    Minutes 15    METs 2.5      Track   Minutes 15    METs 2.5      Intensity   THRR 40-80% of Max Heartrate 62-124    Ratings of Perceived Exertion 11-13    Perceived Dyspnea 0-4      Progression   Progression Continue to progress workloads to maintain intensity without signs/symptoms of physical distress.      Resistance Training   Training Prescription Yes    Weight red bands    Reps 10-15             Perform Capillary Blood Glucose checks as needed.  Exercise Prescription Changes:   Exercise Prescription Changes     Row Name 05/02/23 0900 05/16/23 0900 05/30/23 0900 06/13/23 0900 06/27/23 0900     Response to Exercise   Blood Pressure (Admit) 118/70 124/64 120/64 130/70 114/68   Blood  Pressure (Exercise) 140/70 142/62 160/72 172/66 124/58   Blood Pressure (Exit) 106/64 102/62 118/62 112/66 120/68   Heart Rate (Admit) 109 bpm 118 bpm 111 bpm 107 bpm 111 bpm   Heart Rate (Exercise) 119 bpm 126 bpm 124 bpm 126 bpm 124 bpm   Heart Rate (  Exit) 111 bpm 116 bpm 117 bpm 107 bpm 95 bpm   Oxygen Saturation (Admit) 100 % 100 % 100 % 98 % 95 %   Oxygen Saturation (Exercise) 100 % 100 % 100 % 91 % 92 %   Oxygen Saturation (Exit) 100 % 100 % 100 % 96 % 97 %   Rating of Perceived Exertion (Exercise) 13 13 15 15 12    Perceived Dyspnea (Exercise) 1 3 3 4 1    Duration Continue with 30 min of aerobic exercise without signs/symptoms of physical distress. Continue with 30 min of aerobic exercise without signs/symptoms of physical distress. Continue with 30 min of aerobic exercise without signs/symptoms of physical distress. Continue with 30 min of aerobic exercise without signs/symptoms of physical distress. Continue with 30 min of aerobic exercise without signs/symptoms of physical distress.   Intensity THRR unchanged THRR unchanged THRR unchanged THRR unchanged THRR unchanged     Progression   Progression Continue to progress workloads to maintain intensity without signs/symptoms of physical distress. Continue to progress workloads to maintain intensity without signs/symptoms of physical distress. Continue to progress workloads to maintain intensity without signs/symptoms of physical distress. Continue to progress workloads to maintain intensity without signs/symptoms of physical distress. Continue to progress workloads to maintain intensity without signs/symptoms of physical distress.     Resistance Training   Training Prescription Yes Yes Yes Yes Yes   Weight red bands red bands red bands red bands red bands   Reps 10-15 10-15 10-15 10-15 10-15   Time 10 Minutes 10 Minutes 10 Minutes 10 Minutes 10 Minutes     Oxygen   Oxygen Continuous Continuous --  Weaned to room air -- --   Liters 2 2  -- -- --     NuStep   Level 1 3 3 3 3    SPM -- -- 80 -- --   Minutes 15 15 15 15 15    METs 2.4 2.7 3.1 3 3      Track   Laps 7.5 8 5.5 5 5    Minutes 15 15 15 15 15    METs 2.23 2.23 1.85 1.77 1.77     Oxygen   Maintain Oxygen Saturation 88% or higher 88% or higher 88% or higher 88% or higher --    Row Name 07/06/23 0947             Response to Exercise   Blood Pressure (Admit) 116/64       Blood Pressure (Exit) 104/60       Heart Rate (Admit) 115 bpm       Heart Rate (Exercise) 122 bpm       Heart Rate (Exit) 109 bpm       Oxygen Saturation (Admit) 100 %       Oxygen Saturation (Exercise) 96 %       Oxygen Saturation (Exit) 100 %       Rating of Perceived Exertion (Exercise) 15       Perceived Dyspnea (Exercise) 3       Duration Continue with 30 min of aerobic exercise without signs/symptoms of physical distress.       Intensity THRR unchanged         Progression   Progression Continue to progress workloads to maintain intensity without signs/symptoms of physical distress.       Average METs 2.7         Resistance Training   Training Prescription Yes       Weight red bands  Reps 10-15       Time 10 Minutes         Oxygen   Oxygen Continuous       Liters 2         NuStep   Level 3       SPM 71       Minutes 15       METs 2.7         Track   Laps 5.5       Minutes 15       METs 1.82         Oxygen   Maintain Oxygen Saturation 88% or higher                Exercise Comments:   Exercise Comments     Row Name 06/06/23 1539           Exercise Comments Completed home [facility]. Skip is currently exercising at home. He uses the seated cycle machine and is walking 2-7 days/wk for 15-20 min/day. I encouraged him to increase his time to 30 min/day. Skip tries walking outside when the weather is good. I recommended he keep walking outside weather permitted. Skip agreed with my recommendations. I am confident in Skip completing an exercise regimen at  home. He seems motivated to exercise and improve his functional capacity.                Exercise Goals and Review:   Exercise Goals     Row Name 04/24/23 0936 04/28/23 1052 05/19/23 1039 06/19/23 1228 09/18/23 0813     Exercise Goals   Increase Physical Activity Yes Yes Yes Yes Yes   Intervention Provide advice, education, support and counseling about physical activity/exercise needs.;Develop an individualized exercise prescription for aerobic and resistive training based on initial evaluation findings, risk stratification, comorbidities and participant's personal goals. Provide advice, education, support and counseling about physical activity/exercise needs.;Develop an individualized exercise prescription for aerobic and resistive training based on initial evaluation findings, risk stratification, comorbidities and participant's personal goals. Provide advice, education, support and counseling about physical activity/exercise needs.;Develop an individualized exercise prescription for aerobic and resistive training based on initial evaluation findings, risk stratification, comorbidities and participant's personal goals. Provide advice, education, support and counseling about physical activity/exercise needs.;Develop an individualized exercise prescription for aerobic and resistive training based on initial evaluation findings, risk stratification, comorbidities and participant's personal goals. Provide advice, education, support and counseling about physical activity/exercise needs.;Develop an individualized exercise prescription for aerobic and resistive training based on initial evaluation findings, risk stratification, comorbidities and participant's personal goals.   Expected Outcomes Short Term: Attend rehab on a regular basis to increase amount of physical activity.;Long Term: Exercising regularly at least 3-5 days a week.;Long Term: Add in home exercise to make exercise part of routine and  to increase amount of physical activity. Short Term: Attend rehab on a regular basis to increase amount of physical activity.;Long Term: Exercising regularly at least 3-5 days a week.;Long Term: Add in home exercise to make exercise part of routine and to increase amount of physical activity. Short Term: Attend rehab on a regular basis to increase amount of physical activity.;Long Term: Exercising regularly at least 3-5 days a week.;Long Term: Add in home exercise to make exercise part of routine and to increase amount of physical activity. Short Term: Attend rehab on a regular basis to increase amount of physical activity.;Long Term: Exercising regularly at least 3-5 days a week.;Long Term: Add in  home exercise to make exercise part of routine and to increase amount of physical activity. Short Term: Attend rehab on a regular basis to increase amount of physical activity.;Long Term: Exercising regularly at least 3-5 days a week.;Long Term: Add in home exercise to make exercise part of routine and to increase amount of physical activity.   Increase Strength and Stamina Yes Yes Yes Yes Yes   Intervention Provide advice, education, support and counseling about physical activity/exercise needs.;Develop an individualized exercise prescription for aerobic and resistive training based on initial evaluation findings, risk stratification, comorbidities and participant's personal goals. Provide advice, education, support and counseling about physical activity/exercise needs.;Develop an individualized exercise prescription for aerobic and resistive training based on initial evaluation findings, risk stratification, comorbidities and participant's personal goals. Provide advice, education, support and counseling about physical activity/exercise needs.;Develop an individualized exercise prescription for aerobic and resistive training based on initial evaluation findings, risk stratification, comorbidities and participant's  personal goals. Provide advice, education, support and counseling about physical activity/exercise needs.;Develop an individualized exercise prescription for aerobic and resistive training based on initial evaluation findings, risk stratification, comorbidities and participant's personal goals. Provide advice, education, support and counseling about physical activity/exercise needs.;Develop an individualized exercise prescription for aerobic and resistive training based on initial evaluation findings, risk stratification, comorbidities and participant's personal goals.   Expected Outcomes Short Term: Increase workloads from initial exercise prescription for resistance, speed, and METs.;Short Term: Perform resistance training exercises routinely during rehab and add in resistance training at home;Long Term: Improve cardiorespiratory fitness, muscular endurance and strength as measured by increased METs and functional capacity ( ) Short Term: Increase workloads from initial exercise prescription for resistance, speed, and METs.;Short Term: Perform resistance training exercises routinely during rehab and add in resistance training at home;Long Term: Improve cardiorespiratory fitness, muscular endurance and strength as measured by increased METs and functional capacity ( ) Short Term: Increase workloads from initial exercise prescription for resistance, speed, and METs.;Short Term: Perform resistance training exercises routinely during rehab and add in resistance training at home;Long Term: Improve cardiorespiratory fitness, muscular endurance and strength as measured by increased METs and functional capacity ( ) Short Term: Increase workloads from initial exercise prescription for resistance, speed, and METs.;Short Term: Perform resistance training exercises routinely during rehab and add in resistance training at home;Long Term: Improve cardiorespiratory fitness, muscular endurance and strength as measured  by increased METs and functional capacity ( ) Short Term: Increase workloads from initial exercise prescription for resistance, speed, and METs.;Short Term: Perform resistance training exercises routinely during rehab and add in resistance training at home;Long Term: Improve cardiorespiratory fitness, muscular endurance and strength as measured by increased METs and functional capacity ( )   Able to understand and use rate of perceived exertion (RPE) scale Yes Yes Yes Yes Yes   Intervention Provide education and explanation on how to use RPE scale Provide education and explanation on how to use RPE scale Provide education and explanation on how to use RPE scale Provide education and explanation on how to use RPE scale Provide education and explanation on how to use RPE scale   Expected Outcomes Short Term: Able to use RPE daily in rehab to express subjective intensity level;Long Term:  Able to use RPE to guide intensity level when exercising independently Short Term: Able to use RPE daily in rehab to express subjective intensity level;Long Term:  Able to use RPE to guide intensity level when exercising independently Short Term: Able to use RPE daily in rehab to express subjective intensity level;Long Term:  Able to use RPE to guide intensity level when exercising independently Short Term: Able to use RPE daily in rehab to express subjective intensity level;Long Term:  Able to use RPE to guide intensity level when exercising independently Short Term: Able to use RPE daily in rehab to express subjective intensity level;Long Term:  Able to use RPE to guide intensity level when exercising independently   Able to understand and use Dyspnea scale Yes Yes Yes Yes Yes   Intervention Provide education and explanation on how to use Dyspnea scale Provide education and explanation on how to use Dyspnea scale Provide education and explanation on how to use Dyspnea scale Provide education and explanation on how to use  Dyspnea scale Provide education and explanation on how to use Dyspnea scale   Expected Outcomes Short Term: Able to use Dyspnea scale daily in rehab to express subjective sense of shortness of breath during exertion;Long Term: Able to use Dyspnea scale to guide intensity level when exercising independently Short Term: Able to use Dyspnea scale daily in rehab to express subjective sense of shortness of breath during exertion;Long Term: Able to use Dyspnea scale to guide intensity level when exercising independently Short Term: Able to use Dyspnea scale daily in rehab to express subjective sense of shortness of breath during exertion;Long Term: Able to use Dyspnea scale to guide intensity level when exercising independently Short Term: Able to use Dyspnea scale daily in rehab to express subjective sense of shortness of breath during exertion;Long Term: Able to use Dyspnea scale to guide intensity level when exercising independently Short Term: Able to use Dyspnea scale daily in rehab to express subjective sense of shortness of breath during exertion;Long Term: Able to use Dyspnea scale to guide intensity level when exercising independently   Knowledge and understanding of Target Heart Rate Range (THRR) Yes Yes Yes Yes Yes   Intervention Provide education and explanation of THRR including how the numbers were predicted and where they are located for reference Provide education and explanation of THRR including how the numbers were predicted and where they are located for reference Provide education and explanation of THRR including how the numbers were predicted and where they are located for reference Provide education and explanation of THRR including how the numbers were predicted and where they are located for reference Provide education and explanation of THRR including how the numbers were predicted and where they are located for reference   Expected Outcomes Short Term: Able to state/look up THRR;Long Term:  Able to use THRR to govern intensity when exercising independently;Short Term: Able to use daily as guideline for intensity in rehab Short Term: Able to state/look up THRR;Long Term: Able to use THRR to govern intensity when exercising independently;Short Term: Able to use daily as guideline for intensity in rehab Short Term: Able to state/look up THRR;Long Term: Able to use THRR to govern intensity when exercising independently;Short Term: Able to use daily as guideline for intensity in rehab Short Term: Able to state/look up THRR;Long Term: Able to use THRR to govern intensity when exercising independently;Short Term: Able to use daily as guideline for intensity in rehab Short Term: Able to state/look up THRR;Long Term: Able to use THRR to govern intensity when exercising independently;Short Term: Able to use daily as guideline for intensity in rehab   Understanding of Exercise Prescription Yes Yes Yes Yes Yes   Intervention Provide education, explanation, and written materials on patient's individual exercise prescription Provide education, explanation, and written materials on patient's individual  exercise prescription Provide education, explanation, and written materials on patient's individual exercise prescription Provide education, explanation, and written materials on patient's individual exercise prescription Provide education, explanation, and written materials on patient's individual exercise prescription   Expected Outcomes Short Term: Able to explain program exercise prescription;Long Term: Able to explain home exercise prescription to exercise independently Short Term: Able to explain program exercise prescription;Long Term: Able to explain home exercise prescription to exercise independently Short Term: Able to explain program exercise prescription;Long Term: Able to explain home exercise prescription to exercise independently Short Term: Able to explain program exercise prescription;Long Term:  Able to explain home exercise prescription to exercise independently Short Term: Able to explain program exercise prescription;Long Term: Able to explain home exercise prescription to exercise independently            Exercise Goals Re-Evaluation :  Exercise Goals Re-Evaluation     Row Name 04/28/23 1052 05/19/23 1039 06/19/23 1228 07/18/23 1557 07/27/23 1552     Exercise Goal Re-Evaluation   Exercise Goals Review Increase Physical Activity;Able to understand and use Dyspnea scale;Understanding of Exercise Prescription;Increase Strength and Stamina;Knowledge and understanding of Target Heart Rate Range (THRR);Able to understand and use rate of perceived exertion (RPE) scale Increase Physical Activity;Able to understand and use Dyspnea scale;Understanding of Exercise Prescription;Increase Strength and Stamina;Knowledge and understanding of Target Heart Rate Range (THRR);Able to understand and use rate of perceived exertion (RPE) scale Increase Physical Activity;Able to understand and use Dyspnea scale;Understanding of Exercise Prescription;Increase Strength and Stamina;Knowledge and understanding of Target Heart Rate Range (THRR);Able to understand and use rate of perceived exertion (RPE) scale Increase Physical Activity;Able to understand and use Dyspnea scale;Understanding of Exercise Prescription;Increase Strength and Stamina;Knowledge and understanding of Target Heart Rate Range (THRR);Able to understand and use rate of perceived exertion (RPE) scale Increase Physical Activity;Able to understand and use Dyspnea scale;Understanding of Exercise Prescription;Increase Strength and Stamina;Knowledge and understanding of Target Heart Rate Range (THRR);Able to understand and use rate of perceived exertion (RPE) scale   Comments Skip is scheduled to begin exercise next week. Will continue to monitor and progress as able. Skip has completed 5 exercise sessions. He exercises for 15 min on the Nustep and  track. Pt averages 2.7 METs at level 3 on the Nustep and 2.23 METs on the track. Skip performs the warmup and cooldown standing/ seated dependent on his shortness of breath. He has increased his workload for the Nustep as METs have slightly increased. It is too soon to notate any major progressions. Will continue to monitor and progress as able. Skip has completed 13 exercise sessions. He exercises for 15 min on the Nustep and track. Pt averages 3.4 METs at level 3 on the Nustep and 2.08 METs on the track. Skip performs the warmup and cooldown standing/ seated dependent on his shortness of breath. His METs have increased for the Nustep and have remained the same for the track. Skip has most likely reached his functional capacity. Skip sits less during the warmup and cooldown. He feels that he have improved since starting PR. Skip has completed 19 exercise sessions. He exercises for 15 min on the Nustep and track. Pt averages 2.7 METs at level 3 on the Nustep and 1.77 METs on the track. Skip performs the warmup and cooldown standing/ seated dependent on his shortness of breath. Skip's METs have remained relatively the same because he has had other health issues and had to miss class. He is motivated to exercise as he tried to exercise at  his best ability today. We have discussed home exercise. Skip is trying exercise at home as often as he can. Will continue to monitor and progress as able. Skip completed 19 exercise sessions. Peak METs were 3.4 on the Nustep and 1.77 on the track. He is being discharged due to hospitalization.   Expected Outcomes Through exercise at rehab and home, the patient will decrease shortness of breath with daily activities and feel confident in carrying out an exercise regimen at home. Through exercise at rehab and home, the patient will decrease shortness of breath with daily activities and feel confident in carrying out an exercise regimen at home. Through exercise at rehab and home, the  patient will decrease shortness of breath with daily activities and feel confident in carrying out an exercise regimen at home. Through exercise at rehab and home, the patient will decrease shortness of breath with daily activities and feel confident in carrying out an exercise regimen at home. Through exercise at rehab and home, the patient will decrease shortness of breath with daily activities and feel confident in carrying out an exercise regimen at home.    Row Name 09/18/23 0813             Exercise Goal Re-Evaluation   Exercise Goals Review Increase Physical Activity;Able to understand and use Dyspnea scale;Understanding of Exercise Prescription;Increase Strength and Stamina;Knowledge and understanding of Target Heart Rate Range (THRR);Able to understand and use rate of perceived exertion (RPE) scale       Comments Skip is scheduled begin exercise again on 12/3.       Expected Outcomes Through exercise at rehab and home, the patient will decrease shortness of breath with daily activities and feel confident in carrying out an exercise regimen at home.                Discharge Exercise Prescription (Final Exercise Prescription Changes):  Exercise Prescription Changes - 07/06/23 0947       Response to Exercise   Blood Pressure (Admit) 116/64    Blood Pressure (Exit) 104/60    Heart Rate (Admit) 115 bpm    Heart Rate (Exercise) 122 bpm    Heart Rate (Exit) 109 bpm    Oxygen Saturation (Admit) 100 %    Oxygen Saturation (Exercise) 96 %    Oxygen Saturation (Exit) 100 %    Rating of Perceived Exertion (Exercise) 15    Perceived Dyspnea (Exercise) 3    Duration Continue with 30 min of aerobic exercise without signs/symptoms of physical distress.    Intensity THRR unchanged      Progression   Progression Continue to progress workloads to maintain intensity without signs/symptoms of physical distress.    Average METs 2.7      Resistance Training   Training Prescription Yes     Weight red bands    Reps 10-15    Time 10 Minutes      Oxygen   Oxygen Continuous    Liters 2      NuStep   Level 3    SPM 71    Minutes 15    METs 2.7      Track   Laps 5.5    Minutes 15    METs 1.82      Oxygen   Maintain Oxygen Saturation 88% or higher             Nutrition:  Target Goals: Understanding of nutrition guidelines, daily intake of sodium 1500mg , cholesterol 200mg , calories 30% from  fat and 7% or less from saturated fats, daily to have 5 or more servings of fruits and vegetables.  Biometrics:  Pre Biometrics - 09/18/23 0805       Pre Biometrics   Grip Strength 22 kg              Nutrition Therapy Plan and Nutrition Goals:  Nutrition Therapy & Goals - 07/25/23 1140       Nutrition Therapy   Diet Heart Healthy Diet    Drug/Food Interactions Statins/Certain Fruits      Personal Nutrition Goals   Nutrition Goal Patient to identify strategies for weight maintenance/ weight gain of 0.5-2.0# per week.   goal in progress.   Comments Goals in progress. Skip continues regular follow-up for kidney transplant; he has required dialysis since heart transplant in 2021. He currently receives hemodialysis M/W/F. His weight has been stable since hospitalization related to pneumonia in May 2024, and per diaylsis notes, his typical weight has been ~60kg. He is down about 3.5# since orientation to our program (start weight was 61.1kg). He lives with his sister who is a great support and does all of the grocery shopping and cooking. Skip reports drinking 1 Boost Breeze daily (250kcals, 9g protein) and 1 [employer] Milkshake (580kcals, 13g protein) daily to aid with weight gain/weight maintenance. We have discussed multiple strategies for weight gain including increasing eating frequency, protein supplements, increasing calories from fat/protein/carbohydrates, etc. He is currently hospitalized for acute respiratory failure; will continue to monitor goals upon  patient's return. Patient will benefit from participation in pulmonary rehab for nutrition, exercise, and lifestyle modification.      Intervention Plan   Intervention Prescribe, educate and counsel regarding individualized specific dietary modifications aiming towards targeted core components such as weight, hypertension, lipid management, diabetes, heart failure and other comorbidities.;Nutrition handout(s) given to patient.    Expected Outcomes Long Term Goal: Adherence to prescribed nutrition plan.;Short Term Goal: Understand basic principles of dietary content, such as calories, fat, sodium, cholesterol and nutrients.             Nutrition Assessments:  Nutrition Assessments - 05/02/23 1113       Rate Your Plate Scores   Pre Score 62            MEDIFICTS Score Key: >=70 Need to make dietary changes  40-70 Heart Healthy Diet <= 40 Therapeutic Level Cholesterol Diet  Flowsheet Row PULMONARY REHAB OTHER RESPIRATORY from 05/02/2023 in [facility]  Picture Your Plate Total Score on Admission 62      Picture Your Plate Scores: <82 Unhealthy dietary pattern with much room for improvement. 41-50 Dietary pattern unlikely to meet recommendations for good health and room for improvement. 51-60 More healthful dietary pattern, with some room for improvement.  >60 Healthy dietary pattern, although there may be some specific behaviors that could be improved.    Nutrition Goals Re-Evaluation:  Nutrition Goals Re-Evaluation     Row Name 05/02/23 1010 05/30/23 1106 06/29/23 0934 07/25/23 1140       Goals   Current Weight 134 lb 11.2 oz (61.1 kg) 132 lb 0.9 oz (59.9 kg) 133 lb 9.6 oz (60.6 kg) 131 lb 2.8 oz (59.5 kg)    Comment GFR 11, Cr 5.5, PTH 194, triglycerides 161, HDL 34, Albumin 2.3 he continues regular follow-up with [facility] s/p heart transplant. Other most recent labs  GFR 11, Cr 5.5, PTH 194, triglycerides  161, HDL 34,  Albumin 2.3 he continues regular follow-up with [facility] s/p heart transplant. Other most recent labs GFR 11, Cr 5.5, PTH 194, triglycerides 161, HDL 34, Albumin 2.3 Cr 4.99, Phos 5.6, [id number]; other most recent labs monitored by [facility] transplant team, triglycerides 161, HDL 34,    Expected Outcome Skip continues regular follow-up for kidney transplant; he has required dialysis since heart transplant in 2021. He currently receives hemodialysis M/W/F. His weight has been stable since hospitalization related to pneumonia in May 2024, and per diaylsis notes, his typical weight has been ~60kg. He lives with his sister who is a great support and does all of the grocery shopping and cooking. Skip reports drinking 1 Boost Breeze daily (250kcals, 9g protein) and 1 Chick-fil[employer] Milkshake (580kcals, 13g protein) daily to aid with weight gain/weight maintenance. Patient will benefit from participation in pulmonary rehab for nutrition, exercise, and lifestyle modification. Goals in progress. Skip continues regular follow-up for kidney transplant; he has required dialysis since heart transplant in 2021. He currently receives hemodialysis M/W/F. His weight has been stable since hospitalization related to pneumonia in May 2024, and per diaylsis notes, his typical weight has been ~60kg. He is down about 4.6# since orientation to our program (start weight was 61.1kg). He lives with his sister who is a great support and does all of the grocery shopping and cooking. Skip reports drinking 1 Boost Breeze daily (250kcals, 9g protein) and 1 Chick-fil-[employer] Milkshake (580kcals, 13g protein) daily to aid with weight gain/weight maintenance. We have discussed multiple strategies for weight gain including increasing eating frequency, protein supplements, increasing calories from fat/protein/carbohydrates, etc. Patient will benefit from participation in pulmonary rehab for nutrition, exercise, and lifestyle modification. Goals in  progress. Skip continues regular follow-up for kidney transplant; he has required dialysis since heart transplant in 2021. He currently receives hemodialysis M/W/F. His weight has been stable since hospitalization related to pneumonia in May 2024, and per diaylsis notes, his typical weight has been ~60kg. He is down about 2.2# since orientation to our program (start weight was 61.1kg). He lives with his sister who is a great support and does all of the grocery shopping and cooking. Skip reports drinking 1 Boost Breeze daily (250kcals, 9g protein) and 1 Chick-fil-a Milkshake (580kcals, 13g protein) daily to aid with weight gain/weight maintenance. We have discussed multiple strategies for weight gain including increasing eating frequency, protein supplements, increasing calories from fat/protein/carbohydrates, etc. Patient will benefit from participation in pulmonary rehab for nutrition, exercise, and lifestyle modification. Goals in progress. Skip continues regular follow-up for kidney transplant; he has required dialysis since heart transplant in 2021. He currently receives hemodialysis M/W/F. His weight has been stable since hospitalization related to pneumonia in May 2024, and per diaylsis notes, his typical weight has been ~60kg. He is down about 3.5# since orientation to our program (start weight was 61.1kg). He lives with his sister who is a great support and does all of the grocery shopping and cooking. Skip reports drinking 1 Boost Breeze daily (250kcals, 9g protein) and 1 Chick-fil-a Milkshake (580kcals, 13g protein) daily to aid with weight gain/weight maintenance. We have discussed multiple strategies for weight gain including increasing eating frequency, protein supplements, increasing calories from fat/protein/carbohydrates, etc. He is currently hospitalized for acute respiratory failure; will continue to monitor goals upon patient's return. Patient will benefit from participation in pulmonary rehab for  nutrition, exercise, and lifestyle modification.             Nutrition Goals Discharge (Final Nutrition Goals Re-Evaluation):  Nutrition Goals Re-Evaluation - 07/25/23 1140       Goals   Current Weight 131 lb 2.8 oz (59.5 kg)    Comment Cr 4.99, Phos 5.6, [id number]; other most recent labs monitored by [facility] transplant team, triglycerides 161, HDL 34,    Expected Outcome Goals in progress. Skip continues regular follow-up for kidney transplant; he has required dialysis since heart transplant in 2021. He currently receives hemodialysis M/W/F. His weight has been stable since hospitalization related to pneumonia in May 2024, and per diaylsis notes, his typical weight has been ~60kg. He is down about 3.5# since orientation to our program (start weight was 61.1kg). He lives with his sister who is a great support and does all of the grocery shopping and cooking. Skip reports drinking 1 Boost Breeze daily (250kcals, 9g protein) and 1 Chick-fil-a Milkshake (580kcals, 13g protein) daily to aid with weight gain/weight maintenance. We have discussed multiple strategies for weight gain including increasing eating frequency, protein supplements, increasing calories from fat/protein/carbohydrates, etc. He is currently hospitalized for acute respiratory failure; will continue to monitor goals upon patient's return. Patient will benefit from participation in pulmonary rehab for nutrition, exercise, and lifestyle modification.             Psychosocial: Target Goals: Acknowledge presence or absence of significant depression and/or stress, maximize coping skills, provide positive support system. Participant is able to verbalize types and ability to use techniques and skills needed for reducing stress and depression.  Initial Review & Psychosocial Screening:  Initial Psych Review & Screening - 09/18/23 0810       Initial Review   Current issues with None Identified      Family Dynamics   Good Support System?  Yes    Comments sister and other family members      Barriers   Psychosocial barriers to participate in program There are no identifiable barriers or psychosocial needs.      Screening Interventions   Interventions Encouraged to exercise             Quality of Life Scores:  Scores of 19 and below usually indicate a poorer quality of life in these areas.  A difference of  2-3 points is a clinically meaningful difference.  A difference of 2-3 points in the total score of the Quality of Life Index has been associated with significant improvement in overall quality of life, self-image, physical symptoms, and general health in studies assessing change in quality of life.  PHQ-9: Review Flowsheet       09/18/2023 04/24/2023  Depression screen PHQ 2/9  Decreased Interest 0 0  Down, Depressed, Hopeless 0 0  PHQ - 2 Score 0 0  Altered sleeping 1 0  Tired, decreased energy 0 0  Change in appetite 0 0  Feeling bad or failure about yourself  0 0  Trouble concentrating 0 0  Moving slowly or fidgety/restless 0 0  Suicidal thoughts 0 0  PHQ-9 Score 1 0  Difficult doing work/chores Not difficult at all Not difficult at all    Details           Interpretation of Total Score  Total Score Depression Severity:  1-4 = Minimal depression, 5-9 = Mild depression, 10-14 = Moderate depression, 15-19 = Moderately severe depression, 20-27 = Severe depression   Psychosocial Evaluation and Intervention:  Psychosocial Evaluation - 09/18/23 0810       Psychosocial Evaluation & Interventions   Interventions Encouraged to exercise with the program and follow  exercise prescription    Comments Skip denies any psychosocial barriers to exercise.    Expected Outcomes For Skip to participate in PR free of psychosocial barriers.    Continue Psychosocial Services  No Follow up required             Psychosocial Re-Evaluation:  Psychosocial Re-Evaluation     Row Name 04/26/23 1550 05/24/23 1223  06/19/23 1210 07/19/23 0913       Psychosocial Re-Evaluation   Current issues with None Identified None Identified None Identified None Identified    Comments No change in psychosocial assessment since orientation 04/24/2023. No concerns identified No new psychosocial barriers or concerns at this time. Skip is enjoying PR and denies any stressors. He states that his mental health is doing "fine". Skip states that his sister does a good job taking care of him and monitoring his overnight oxygen saturations. He is tolerating his HD sessions on T, Th, Sat well. He stated he is getting worked up for a kidney transplant. Skip stated he is looking forward to traveling and going to baseball games with his sister. Skip denies any needs at this time. Skip had to be discharged from the program on 07/26/23. His sister called [place] and stated he was hospitalized for bilateral pneumonia. He has been transferred from [facility] to [facility]. Skip completed 19 sessions of Pulm Rehab. Skip never complained while in class, worked hard, even when he didn't feel well. Skip denied any psychosocial issues or concerns at his last re-evaluation. He has great support from his sister.    Expected Outcomes For Skip to attend PR without any barriers or concerns For Skip to attend PR without any barriers or concerns For Skip to attend PR without any barriers or concerns For Skip to continue to exercise for stress relief post hospitalization    Interventions Encouraged to attend Pulmonary Rehabilitation for the exercise Encouraged to attend Pulmonary Rehabilitation for the exercise Encouraged to attend Pulmonary Rehabilitation for the exercise --    Continue Psychosocial Services  No Follow up required No Follow up required No Follow up required No Follow up required             Psychosocial Discharge (Final Psychosocial Re-Evaluation):  Psychosocial Re-Evaluation - 07/19/23 0913       Psychosocial Re-Evaluation   Current  issues with None Identified    Comments Skip had to be discharged from the program on 07/26/23. His sister called [place] and stated he was hospitalized for bilateral pneumonia. He has been transferred from [facility] to [facility]. Skip completed 19 sessions of Pulm Rehab. Skip never complained while in class, worked hard, even when he didn't feel well. Skip denied any psychosocial issues or concerns at his last re-evaluation. He has great support from his sister.    Expected Outcomes For Skip to continue to exercise for stress relief post hospitalization    Interventions --    Continue Psychosocial Services  No Follow up required             Education: Education Goals: Education classes will be provided on a weekly basis, covering required topics. Participant will state understanding/return demonstration of topics presented.  Learning Barriers/Preferences:  Learning Barriers/Preferences - 04/24/23 0924       Learning Barriers/Preferences   Learning Barriers Sight    Learning Preferences Skilled Demonstration             Education Topics: Know Your Numbers Group instruction that is supported by a  PowerPoint presentation. Instructor discusses importance of knowing and understanding resting, exercise, and post-exercise oxygen saturation, heart rate, and blood pressure. Oxygen saturation, heart rate, blood pressure, rating of perceived exertion, and dyspnea are reviewed along with a normal range for these values.  Flowsheet Row PULMONARY REHAB OTHER RESPIRATORY from 05/04/2023 in [facility] for Heart, Vascular, & Lung Health  Date 05/04/23  Educator EP  Instruction Review Code 1- Verbalizes Understanding       Exercise for the Pulmonary Patient Group instruction that is supported by a PowerPoint presentation. Instructor discusses benefits of exercise, core components of exercise, frequency, duration, and intensity of an exercise routine, importance of  utilizing pulse oximetry during exercise, safety while exercising, and options of places to exercise outside of rehab.    MET Level  Group instruction provided by PowerPoint, verbal discussion, and written material to support subject matter. Instructor reviews what METs are and how to increase METs.  Flowsheet Row PULMONARY REHAB OTHER RESPIRATORY from 06/22/2023 in [facility] for Heart, Vascular, & Lung Health  Date 06/22/23  Educator EP  Instruction Review Code 1- Verbalizes Understanding       Pulmonary Medications Verbally interactive group education provided by instructor with focus on inhaled medications and proper administration.   Anatomy and Physiology of the Respiratory System Group instruction provided by PowerPoint, verbal discussion, and written material to support subject matter. Instructor reviews respiratory cycle and anatomical components of the respiratory system and their functions. Instructor also reviews differences in obstructive and restrictive respiratory diseases with examples of each.  Flowsheet Row PULMONARY REHAB OTHER RESPIRATORY from 07/06/2023 in [facility] for Heart, Vascular, & Lung Health  Date 07/06/23  Educator RT  Instruction Review Code 1- Verbalizes Understanding       Oxygen Safety Group instruction provided by PowerPoint, verbal discussion, and written material to support subject matter. There is an overview of "What is Oxygen" and "Why do we need it".  Instructor also reviews how to create a safe environment for oxygen use, the importance of using oxygen as prescribed, and the risks of noncompliance. There is a brief discussion on traveling with oxygen and resources the patient may utilize. Flowsheet Row PULMONARY REHAB OTHER RESPIRATORY from 05/11/2023 in [facility] for Heart, Vascular, & Lung Health  Date 05/11/23  Educator RN  Instruction Review Code 1- Verbalizes  Understanding       Oxygen Use Group instruction provided by PowerPoint, verbal discussion, and written material to discuss how supplemental oxygen is prescribed and different types of oxygen supply systems. Resources for more information are provided.    Breathing Techniques Group instruction that is supported by demonstration and informational handouts. Instructor discusses the benefits of pursed lip and diaphragmatic breathing and detailed demonstration on how to perform both.  Flowsheet Row PULMONARY REHAB OTHER RESPIRATORY from 05/25/2023 in [facility] for Heart, Vascular, & Lung Health  Date 05/25/23  Educator RN  Instruction Review Code 1- Verbalizes Understanding        Risk Factor Reduction Group instruction that is supported by a PowerPoint presentation. Instructor discusses the definition of a risk factor, different risk factors for pulmonary disease, and how the heart and lungs work together. Flowsheet Row PULMONARY REHAB OTHER RESPIRATORY from 06/15/2023 in [facility] for Heart, Vascular, & Lung Health  Date 06/15/23  Educator EP  Instruction Review Code 1- Verbalizes Understanding  Pulmonary Diseases Group instruction provided by PowerPoint, verbal discussion, and written material to support subject matter. Instructor gives an overview of the different type of pulmonary diseases. There is also a discussion on risk factors and symptoms as well as ways to manage the diseases.   Stress and Energy Conservation Group instruction provided by PowerPoint, verbal discussion, and written material to support subject matter. Instructor gives an overview of stress and the impact it can have on the body. Instructor also reviews ways to reduce stress. There is also a discussion on energy conservation and ways to conserve energy throughout the day.   Warning Signs and Symptoms Group instruction provided by PowerPoint, verbal  discussion, and written material to support subject matter. Instructor reviews warning signs and symptoms of stroke, heart attack, cold and flu. Instructor also reviews ways to prevent the spread of infection.   Other Education Group or individual verbal, written, or video instructions that support the educational goals of the pulmonary rehab program. Flowsheet Row PULMONARY REHAB OTHER RESPIRATORY from 06/29/2023 in [facility] for Heart, Vascular, & Lung Health  Date 06/29/23  Educator RT  Instruction Review Code 1- Verbalizes Understanding        Knowledge Questionnaire Score:  Knowledge Questionnaire Score - 04/24/23 2130       Knowledge Questionnaire Score   Pre Score 18/18             Core Components/Risk Factors/Patient Goals at Admission:  Personal Goals and Risk Factors at Admission - 09/18/23 0913       Core Components/Risk Factors/Patient Goals on Admission    Weight Management --    Improve shortness of breath with ADL's Yes    Intervention Provide education, individualized exercise plan and daily activity instruction to help decrease symptoms of SOB with activities of daily living.    Expected Outcomes Short Term: Improve cardiorespiratory fitness to achieve a reduction of symptoms when performing ADLs;Long Term: Be able to perform more ADLs without symptoms or delay the onset of symptoms             Core Components/Risk Factors/Patient Goals Review:   Goals and Risk Factor Review     Row Name 04/26/23 1552 05/24/23 1226 06/19/23 1244 07/19/23 0917       Core Components/Risk Factors/Patient Goals Review   Personal Goals Review Weight Management/Obesity;Develop more efficient breathing techniques such as purse lipped breathing and diaphragmatic breathing and practicing self-pacing with activity.;Improve shortness of breath with ADL's Weight Management/Obesity;Develop more efficient breathing techniques such as purse lipped breathing  and diaphragmatic breathing and practicing self-pacing with activity.;Improve shortness of breath with ADL's Weight Management/Obesity;Develop more efficient breathing techniques such as purse lipped breathing and diaphragmatic breathing and practicing self-pacing with activity.;Improve shortness of breath with ADL's Weight Management/Obesity;Improve shortness of breath with ADL's    Review Unable to evaluate any progress towards goals yet. Skip is scheduled to begin on 7/9. Goal progressing for weight gain. Skip is working with staff dietician to obtain weight gain goal. Goal progressing for improve shortness of breath with ADL's. Goal progressing on developing more efficient breathing techniques such as purse lipped breathing and diaphragmatic breathing. Goal not progressing for weight gain. Skip has not gained any weight since starting the program. Skip and his sister are working with our staff dietician to gain weight. He is eating a renal diet. Goal progressing for improving shortness of breath with ADL's. We have weaned Skip off his oxygen since starting the program. He still uses  oxygen at night. O2 saturations have been > 88% while exercising. Goal met on developing more efficient breathing techniques such as purse lipped breathing and diaphragmatic breathing. Skip can initiate PLB on when he is short of breath while exercising. He states he has been practicing diaphragmatic breathing at home. Skip is enjoying the class and stated he can see the progress. Skip had to be discharged from the program on 07/26/23 after completing 19 sessions. Skip is currently hospitalized for bilateral pneumonia, and his plan is to go to skilled nursing after discharge. Core components/risk factors/patient goals are as follows: Goal not met for weight gain. Skip is down ~2.5# since orientation. Our dietician has spoken to both Skip and his sister for weight gain advice. Due to his recent illness, Skip had decreased appetite.  Goal not met for improving shortness of breath with ADL's. We had weaned Skip off his oxygen during class, but due to his recent pleural effusion, thoracentesis, and bilateral pneumonia he had to be placed back on oxygen.    Expected Outcomes For Skip to gain weight, improve his shortness of breath with ADLs, and develop more efficient breathing techniques. See admission goals For Skip to gain weight, improve his shortness of breath with his ADLs & develop more efficient breathing techniques For Skip to gain weight and improve SOB with his ADL's post pulmonary rehab             Core Components/Risk Factors/Patient Goals at Discharge (Final Review):   Goals and Risk Factor Review - 07/19/23 0917       Core Components/Risk Factors/Patient Goals Review   Personal Goals Review Weight Management/Obesity;Improve shortness of breath with ADL's    Review Skip had to be discharged from the program on 07/26/23 after completing 19 sessions. Skip is currently hospitalized for bilateral pneumonia, and his plan is to go to skilled nursing after discharge. Core components/risk factors/patient goals are as follows: Goal not met for weight gain. Skip is down ~2.5# since orientation. Our dietician has spoken to both Skip and his sister for weight gain advice. Due to his recent illness, Skip had decreased appetite. Goal not met for improving shortness of breath with ADL's. We had weaned Skip off his oxygen during class, but due to his recent pleural effusion, thoracentesis, and bilateral pneumonia he had to be placed back on oxygen.    Expected Outcomes For Skip to gain weight and improve SOB with his ADL's post pulmonary rehab             ITP Comments: Dr. Mechele Collin is Medical Director for Pulmonary Rehab at [facility].

## 2023-09-18 NOTE — Progress Notes (Signed)
Rickey Erickson 67 y.o. male Pulmonary Rehab Orientation Note This patient who was referred to Pulmonary Rehab by Dr. [name] with the diagnosis of ILD arrived today in Cardiac and Pulmonary Rehab. He arrived ambulatory with assistive device with normal gait. He does carry portable oxygen. Rickey Erickson is the provider for their DME. Per patient, Rickey Erickson uses oxygen at night for sleep. Color good, skin warm and dry. Patient is oriented to time and place. Patient's medical history, psychosocial health, and medications reviewed. Psychosocial assessment reveals patient lives with sister. Rickey Erickson is currently retired. Patient hobbies include investing, watching sports on tv, and attending sporting events. Patient reports his stress level is low. Areas of stress/anxiety include N/A. Patient does not exhibit signs of depression. PHQ2/9 score 0/1. Rickey Erickson shows good  coping skills with positive outlook on life. Offered emotional support and reassurance. Will continue to monitor. Physical assessment performed by Rickey Hart RN. Please see their orientation physical assessment note. Rickey Erickson reports he  does take medications as prescribed. Patient states he  follows a heart healthy diet. The patient reports no specific efforts to gain or lose weight.. Patient's weight will be monitored closely. Demonstration and practice of PLB using pulse oximeter. Rickey Erickson. Safety and hand hygiene in the exercise area reviewed with patient. Rickey Erickson voices understanding of the information reviewed. Department expectations discussed with patient and achievable goals were set. The patient shows enthusiasm about attending the program and we look forward to working with Rickey Erickson. Rickey Erickson completed a 6 min walk test today and is scheduled to begin exercise on 09/26/23.   [id number] Rickey San, MS, ACSM-CEP

## 2023-09-20 ENCOUNTER — Encounter: Payer: Self-pay | Admitting: Pulmonary Disease

## 2023-09-20 DIAGNOSIS — J9612 Chronic respiratory failure with hypercapnia: Secondary | ICD-10-CM

## 2023-09-20 NOTE — Progress Notes (Signed)
Pulmonary Individual Treatment Plan  Patient Details  Name: Rickey Erickson MRN: [id number] Date of Birth: May 10, 1956 Referring Provider:   [name] Pulmonary Rehab Walk Test from 09/18/2023 in [facility]  Referring Provider [name]       Initial Encounter Date:  Flowsheet Row Pulmonary Rehab Walk Test from 09/18/2023 in [facility]  Date 09/18/23       Visit Diagnosis: ILD (interstitial lung disease) (HCC)  Patient's Home Medications on Admission:   Current Outpatient Medications:    acetaminophen (TYLENOL) 500 MG tablet, Take 500 mg by mouth every 6 (six) hours as needed for headache (pain)., Disp: , Rfl:    albumin human 25 % bottle, Inject 100 mLs (25 g total) into the vein as needed (Give during dialysis for SBP <100.)., Disp: , Rfl:    apixaban (ELIQUIS) 2.5 MG TABS tablet, Take 2.5 mg by mouth 2 (two) times daily., Disp: , Rfl:    atorvastatin (LIPITOR) 20 MG tablet, Take 20 mg by mouth every morning., Disp: , Rfl:    CALCIUM PO, Take 1 tablet by mouth 2 (two) times daily., Disp: , Rfl:    clopidogrel (PLAVIX) 75 MG tablet, Take 75 mg by mouth every morning., Disp: , Rfl:    Darbepoetin Alfa (ARANESP) 150 MCG/0.3ML SOSY injection, Inject 0.3 mLs (150 mcg total) into the skin every Wednesday at 6 PM., Disp: , Rfl:    midodrine (PROAMATINE) 10 MG tablet, Take 10 mg by mouth See admin instructions. Take 10 mg once daily prior to dialysis on Monday, Wednesday, Friday., Disp: , Rfl:    mirtazapine (REMERON) 30 MG tablet, Take 30 mg by mouth at bedtime., Disp: , Rfl:    pantoprazole (PROTONIX) 40 MG tablet, Take 40 mg by mouth every morning., Disp: , Rfl:    tacrolimus (PROGRAF) 1 MG capsule, Take 1 mg by mouth 2 (two) times daily. Take in addition to 5 mg capsule twice daily, for a total dose of 6 mg twice daily., Disp: , Rfl:    tacrolimus (PROGRAF) 5 MG capsule,  Take 5 mg by mouth 2 (two) times daily. Take in addition to 1 mg capsule twice daily, for a total dose of 6 mg twice daily., Disp: , Rfl:   Past Medical History: Past Medical History:  Diagnosis Date   Anemia    Heart failure (HCC)    Hypertension    Osteoporosis    Renal disorder     Tobacco Use: Social History   Tobacco Use  Smoking Status Never  Smokeless Tobacco Never    Labs: Review Flowsheet  More data may exist      Latest Ref Rng & Units 01/06/2022 09/08/2022 03/07/2023 07/21/2023 07/26/2023  Labs for ITP Cardiac and Pulmonary Rehab  Cholestrol 0 - 200 mg/dL 875  643  - - -  LDL (calc) 0 - 99 mg/dL 90  80  - - -  HDL-C >32 mg/dL 30  39  - - -  Trlycerides <150 mg/dL 90  86  - - -  Hemoglobin A1c 4.8 - 5.6 % 4.9  - - - -  PH, Arterial 7.35 - 7.45 - - 7.497  7.227  7.109  7.060  -  PCO2 arterial 32 - 48 mmHg - - 33.6  56.0  85.7  107.3  -  Bicarbonate 20.0 - 28.0 mmol/L - - 26.0  23.3  27.6  25.0  27.4  30.5  23.9  25.0   TCO2 22 - 32 mmol/L - - 27  25  30  27  30   34  -  Acid-base deficit 0.0 - 2.0 mmol/L - - - 5.0  4.0  6.0  4.0  3.0  6.0  4.7   O2 Saturation % - - 96  93  30  75  86  92  98.2  77.3     Details       Multiple values from one day are sorted in reverse-chronological order         Capillary Blood Glucose: Lab Results  Component Value Date   GLUCAP 80 07/27/2023   GLUCAP 92 07/21/2023   GLUCAP 86 07/21/2023     Pulmonary Assessment Scores:  Pulmonary Assessment Scores     Row Name 04/24/23 0935 09/18/23 0812       ADL UCSD   ADL Phase Entry Entry    SOB Score total 50 --      CAT Score   CAT Score 16 --      mMRC Score   mMRC Score 3 2            UCSD: Self-administered rating of dyspnea associated with activities of daily living (ADLs) 6-point scale (0 = "not at all" to 5 = "maximal or unable to do because of breathlessness")  Scoring Scores range from 0 to 120.  Minimally important difference is 5 units  CAT: CAT  can identify the health impairment of COPD patients and is better correlated with disease progression.  CAT has a scoring range of zero to 40. The CAT score is classified into four groups of low (less than 10), medium (10 - 20), high (21-30) and very high (31-40) based on the impact level of disease on health status. A CAT score over 10 suggests significant symptoms.  A worsening CAT score could be explained by an exacerbation, poor medication adherence, poor inhaler technique, or progression of COPD or comorbid conditions.  CAT MCID is 2 points  mMRC: mMRC (Modified Medical Research Council) Dyspnea Scale is used to assess the degree of baseline functional disability in patients of respiratory disease due to dyspnea. No minimal important difference is established. A decrease in score of 1 point or greater is considered a positive change.   Pulmonary Function Assessment:  Pulmonary Function Assessment - 09/18/23 0812       Breath   Bilateral Breath Sounds Decreased    Shortness of Breath Yes;Limiting activity             Exercise Target Goals: Exercise Program Goal: Individual exercise prescription set using results from initial 6 min walk test and THRR while considering  patient's activity barriers and safety.   Exercise Prescription Goal: Initial exercise prescription builds to 30-45 minutes a day of aerobic activity, 2-3 days per week.  Home exercise guidelines will be given to patient during program as part of exercise prescription that the participant will acknowledge.  Activity Barriers & Risk Stratification:  Activity Barriers & Cardiac Risk Stratification - 04/24/23 0936       Activity Barriers & Cardiac Risk Stratification   Activity Barriers Balance Concerns;Shortness of Breath;Muscular Weakness;Deconditioning   Rt arm herograft            6 Minute Walk:  6 Minute Walk     Row Name 04/24/23 1112 09/18/23 0906       6 Minute Walk   Phase Initial Initial  Distance 1015 feet 1140 feet    Walk Time 6 minutes 6 minutes    # of Rest Breaks 0 0    MPH 1.92 2.16    METS 3.36 3.68    RPE 11 13    Perceived Dyspnea  3 2    VO2 Peak 11.75 12.87    Symptoms No No    Resting HR 110 bpm 114 bpm    Resting BP 120/62 130/68    Resting Oxygen Saturation  98 % 95 %    Exercise Oxygen Saturation  during 6 min walk 95 % 92 %    Max Ex. HR 124 bpm 131 bpm    Max Ex. BP 132/64 134/68    2 Minute Post BP 122/64 122/70      Interval HR   1 Minute HR 114 122    2 Minute HR 118 128    3 Minute HR 121 129    4 Minute HR 123 128    5 Minute HR 123 131    6 Minute HR 124 131    2 Minute Post HR -- 119    Interval Heart Rate? Yes Yes      Interval Oxygen   Interval Oxygen? Yes Yes    Baseline Oxygen Saturation % 98 % 95 %    1 Minute Oxygen Saturation % 99 % 94 %    1 Minute Liters of Oxygen 2 L 0 L    2 Minute Oxygen Saturation % 97 % 94 %    2 Minute Liters of Oxygen 2 L 0 L    3 Minute Oxygen Saturation % 95 % 92 %    3 Minute Liters of Oxygen 2 L 0 L    4 Minute Oxygen Saturation % 97 % 92 %    4 Minute Liters of Oxygen 2 L 0 L    5 Minute Oxygen Saturation % 95 % 93 %    5 Minute Liters of Oxygen 2 L 0 L    6 Minute Oxygen Saturation % 96 % 92 %    6 Minute Liters of Oxygen 2 L 0 L    2 Minute Post Oxygen Saturation % 100 % 95 %    2 Minute Post Liters of Oxygen 2 L 0 L             Oxygen Initial Assessment:  Oxygen Initial Assessment - 09/18/23 0811       Home Oxygen   Home Oxygen Device None    Sleep Oxygen Prescription CPAP    Liters per minute 2    Home Exercise Oxygen Prescription None    Home Resting Oxygen Prescription None    Compliance with Home Oxygen Use Yes      Initial 6 min Walk   Oxygen Used None      Program Oxygen Prescription   Program Oxygen Prescription None      Intervention   Short Term Goals To learn and exhibit compliance with exercise, home and travel O2 prescription;To learn and understand  importance of maintaining oxygen saturations>88%;To learn and demonstrate proper use of respiratory medications;To learn and understand importance of monitoring SPO2 with pulse oximeter and demonstrate accurate use of the pulse oximeter.;To learn and demonstrate proper pursed lip breathing techniques or other breathing techniques.     Long  Term Goals Exhibits compliance with exercise, home  and travel O2 prescription;Verbalizes importance of monitoring SPO2 with pulse oximeter and return demonstration;Exhibits proper breathing techniques,  such as pursed lip breathing or other method taught during program session;Maintenance of O2 saturations>88%;Compliance with respiratory medication             Oxygen Re-Evaluation:  Oxygen Re-Evaluation     Row Name 04/28/23 1053 05/19/23 1043 06/19/23 1235 07/18/23 1601       Program Oxygen Prescription   Program Oxygen Prescription Continuous Continuous Continuous Continuous    Liters per minute 2 2 2 2       Home Oxygen   Home Oxygen Device E-Tanks;Home Concentrator E-Tanks;Home Concentrator E-Tanks;Home Concentrator E-Tanks;Home Concentrator    Sleep Oxygen Prescription Continuous Continuous Continuous Continuous    Liters per minute 1 1 1 1     Home Exercise Oxygen Prescription Continuous Continuous Continuous Continuous    Liters per minute 2 2 2 2     Home Resting Oxygen Prescription Continuous Continuous Continuous Continuous    Liters per minute 2 2 2 2     Compliance with Home Oxygen Use Yes Yes Yes Yes      Goals/Expected Outcomes   Short Term Goals To learn and exhibit compliance with exercise, home and travel O2 prescription;To learn and understand importance of maintaining oxygen saturations>88%;To learn and demonstrate proper use of respiratory medications;To learn and understand importance of monitoring SPO2 with pulse oximeter and demonstrate accurate use of the pulse oximeter.;To learn and demonstrate proper pursed lip breathing  techniques or other breathing techniques.  To learn and exhibit compliance with exercise, home and travel O2 prescription;To learn and understand importance of maintaining oxygen saturations>88%;To learn and demonstrate proper use of respiratory medications;To learn and understand importance of monitoring SPO2 with pulse oximeter and demonstrate accurate use of the pulse oximeter.;To learn and demonstrate proper pursed lip breathing techniques or other breathing techniques.  To learn and exhibit compliance with exercise, home and travel O2 prescription;To learn and understand importance of maintaining oxygen saturations>88%;To learn and demonstrate proper use of respiratory medications;To learn and understand importance of monitoring SPO2 with pulse oximeter and demonstrate accurate use of the pulse oximeter.;To learn and demonstrate proper pursed lip breathing techniques or other breathing techniques.  To learn and exhibit compliance with exercise, home and travel O2 prescription;To learn and understand importance of maintaining oxygen saturations>88%;To learn and demonstrate proper use of respiratory medications;To learn and understand importance of monitoring SPO2 with pulse oximeter and demonstrate accurate use of the pulse oximeter.;To learn and demonstrate proper pursed lip breathing techniques or other breathing techniques.     Long  Term Goals Exhibits compliance with exercise, home  and travel O2 prescription;Verbalizes importance of monitoring SPO2 with pulse oximeter and return demonstration;Exhibits proper breathing techniques, such as pursed lip breathing or other method taught during program session;Maintenance of O2 saturations>88%;Compliance with respiratory medication Exhibits compliance with exercise, home  and travel O2 prescription;Verbalizes importance of monitoring SPO2 with pulse oximeter and return demonstration;Exhibits proper breathing techniques, such as pursed lip breathing or other  method taught during program session;Maintenance of O2 saturations>88%;Compliance with respiratory medication Exhibits compliance with exercise, home  and travel O2 prescription;Verbalizes importance of monitoring SPO2 with pulse oximeter and return demonstration;Exhibits proper breathing techniques, such as pursed lip breathing or other method taught during program session;Maintenance of O2 saturations>88%;Compliance with respiratory medication Exhibits compliance with exercise, home  and travel O2 prescription;Verbalizes importance of monitoring SPO2 with pulse oximeter and return demonstration;Exhibits proper breathing techniques, such as pursed lip breathing or other method taught during program session;Maintenance of O2 saturations>88%;Compliance with respiratory medication    Goals/Expected Outcomes Compliance and understanding  of oxygen saturation monitoring and breathing techniques to decrease shortness of breath. Compliance and understanding of oxygen saturation monitoring and breathing techniques to decrease shortness of breath. Compliance and understanding of oxygen saturation monitoring and breathing techniques to decrease shortness of breath. Compliance and understanding of oxygen saturation monitoring and breathing techniques to decrease shortness of breath.             Oxygen Discharge (Final Oxygen Re-Evaluation):  Oxygen Re-Evaluation - 07/18/23 1601       Program Oxygen Prescription   Program Oxygen Prescription Continuous    Liters per minute 2      Home Oxygen   Home Oxygen Device E-Tanks;Home Concentrator    Sleep Oxygen Prescription Continuous    Liters per minute 1    Home Exercise Oxygen Prescription Continuous    Liters per minute 2    Home Resting Oxygen Prescription Continuous    Liters per minute 2    Compliance with Home Oxygen Use Yes      Goals/Expected Outcomes   Short Term Goals To learn and exhibit compliance with exercise, home and travel O2  prescription;To learn and understand importance of maintaining oxygen saturations>88%;To learn and demonstrate proper use of respiratory medications;To learn and understand importance of monitoring SPO2 with pulse oximeter and demonstrate accurate use of the pulse oximeter.;To learn and demonstrate proper pursed lip breathing techniques or other breathing techniques.     Long  Term Goals Exhibits compliance with exercise, home  and travel O2 prescription;Verbalizes importance of monitoring SPO2 with pulse oximeter and return demonstration;Exhibits proper breathing techniques, such as pursed lip breathing or other method taught during program session;Maintenance of O2 saturations>88%;Compliance with respiratory medication    Goals/Expected Outcomes Compliance and understanding of oxygen saturation monitoring and breathing techniques to decrease shortness of breath.             Initial Exercise Prescription:  Initial Exercise Prescription - 09/18/23 0900       Date of Initial Exercise RX and Referring Provider   Date 09/18/23    Referring Provider [name]    Expected Discharge Date 12/14/23      Oxygen   Maintain Oxygen Saturation 88% or higher      Recumbant Bike   Level 1    Minutes 15    METs 2.5      Track   Minutes 15    METs 2.5      Intensity   THRR 40-80% of Max Heartrate 62-124    Ratings of Perceived Exertion 11-13    Perceived Dyspnea 0-4      Progression   Progression Continue to progress workloads to maintain intensity without signs/symptoms of physical distress.      Resistance Training   Training Prescription Yes    Weight red bands    Reps 10-15             Perform Capillary Blood Glucose checks as needed.  Exercise Prescription Changes:   Exercise Prescription Changes     Row Name 05/02/23 0900 05/16/23 0900 05/30/23 0900 06/13/23 0900 06/27/23 0900     Response to Exercise   Blood Pressure (Admit) 118/70 124/64 120/64 130/70 114/68   Blood  Pressure (Exercise) 140/70 142/62 160/72 172/66 124/58   Blood Pressure (Exit) 106/64 102/62 118/62 112/66 120/68   Heart Rate (Admit) 109 bpm 118 bpm 111 bpm 107 bpm 111 bpm   Heart Rate (Exercise) 119 bpm 126 bpm 124 bpm 126 bpm 124 bpm   Heart Rate (  Exit) 111 bpm 116 bpm 117 bpm 107 bpm 95 bpm   Oxygen Saturation (Admit) 100 % 100 % 100 % 98 % 95 %   Oxygen Saturation (Exercise) 100 % 100 % 100 % 91 % 92 %   Oxygen Saturation (Exit) 100 % 100 % 100 % 96 % 97 %   Rating of Perceived Exertion (Exercise) 13 13 15 15 12    Perceived Dyspnea (Exercise) 1 3 3 4 1    Duration Continue with 30 min of aerobic exercise without signs/symptoms of physical distress. Continue with 30 min of aerobic exercise without signs/symptoms of physical distress. Continue with 30 min of aerobic exercise without signs/symptoms of physical distress. Continue with 30 min of aerobic exercise without signs/symptoms of physical distress. Continue with 30 min of aerobic exercise without signs/symptoms of physical distress.   Intensity THRR unchanged THRR unchanged THRR unchanged THRR unchanged THRR unchanged     Progression   Progression Continue to progress workloads to maintain intensity without signs/symptoms of physical distress. Continue to progress workloads to maintain intensity without signs/symptoms of physical distress. Continue to progress workloads to maintain intensity without signs/symptoms of physical distress. Continue to progress workloads to maintain intensity without signs/symptoms of physical distress. Continue to progress workloads to maintain intensity without signs/symptoms of physical distress.     Resistance Training   Training Prescription Yes Yes Yes Yes Yes   Weight red bands red bands red bands red bands red bands   Reps 10-15 10-15 10-15 10-15 10-15   Time 10 Minutes 10 Minutes 10 Minutes 10 Minutes 10 Minutes     Oxygen   Oxygen Continuous Continuous --  Weaned to room air -- --   Liters 2 2  -- -- --     NuStep   Level 1 3 3 3 3    SPM -- -- 80 -- --   Minutes 15 15 15 15 15    METs 2.4 2.7 3.1 3 3      Track   Laps 7.5 8 5.5 5 5    Minutes 15 15 15 15 15    METs 2.23 2.23 1.85 1.77 1.77     Oxygen   Maintain Oxygen Saturation 88% or higher 88% or higher 88% or higher 88% or higher --    Row Name 07/06/23 0947             Response to Exercise   Blood Pressure (Admit) 116/64       Blood Pressure (Exit) 104/60       Heart Rate (Admit) 115 bpm       Heart Rate (Exercise) 122 bpm       Heart Rate (Exit) 109 bpm       Oxygen Saturation (Admit) 100 %       Oxygen Saturation (Exercise) 96 %       Oxygen Saturation (Exit) 100 %       Rating of Perceived Exertion (Exercise) 15       Perceived Dyspnea (Exercise) 3       Duration Continue with 30 min of aerobic exercise without signs/symptoms of physical distress.       Intensity THRR unchanged         Progression   Progression Continue to progress workloads to maintain intensity without signs/symptoms of physical distress.       Average METs 2.7         Resistance Training   Training Prescription Yes       Weight red bands  Reps 10-15       Time 10 Minutes         Oxygen   Oxygen Continuous       Liters 2         NuStep   Level 3       SPM 71       Minutes 15       METs 2.7         Track   Laps 5.5       Minutes 15       METs 1.82         Oxygen   Maintain Oxygen Saturation 88% or higher                Exercise Comments:   Exercise Comments     Row Name 06/06/23 1539           Exercise Comments Completed home [facility]. Skip is currently exercising at home. He uses the seated cycle machine and is walking 2-7 days/wk for 15-20 min/day. I encouraged him to increase his time to 30 min/day. Skip tries walking outside when the weather is good. I recommended he keep walking outside weather permitted. Skip agreed with my recommendations. I am confident in Skip completing an exercise regimen at  home. He seems motivated to exercise and improve his functional capacity.                Exercise Goals and Review:   Exercise Goals     Row Name 04/24/23 0936 04/28/23 1052 05/19/23 1039 06/19/23 1228 09/18/23 0813     Exercise Goals   Increase Physical Activity Yes Yes Yes Yes Yes   Intervention Provide advice, education, support and counseling about physical activity/exercise needs.;Develop an individualized exercise prescription for aerobic and resistive training based on initial evaluation findings, risk stratification, comorbidities and participant's personal goals. Provide advice, education, support and counseling about physical activity/exercise needs.;Develop an individualized exercise prescription for aerobic and resistive training based on initial evaluation findings, risk stratification, comorbidities and participant's personal goals. Provide advice, education, support and counseling about physical activity/exercise needs.;Develop an individualized exercise prescription for aerobic and resistive training based on initial evaluation findings, risk stratification, comorbidities and participant's personal goals. Provide advice, education, support and counseling about physical activity/exercise needs.;Develop an individualized exercise prescription for aerobic and resistive training based on initial evaluation findings, risk stratification, comorbidities and participant's personal goals. Provide advice, education, support and counseling about physical activity/exercise needs.;Develop an individualized exercise prescription for aerobic and resistive training based on initial evaluation findings, risk stratification, comorbidities and participant's personal goals.   Expected Outcomes Short Term: Attend rehab on a regular basis to increase amount of physical activity.;Long Term: Exercising regularly at least 3-5 days a week.;Long Term: Add in home exercise to make exercise part of routine and  to increase amount of physical activity. Short Term: Attend rehab on a regular basis to increase amount of physical activity.;Long Term: Exercising regularly at least 3-5 days a week.;Long Term: Add in home exercise to make exercise part of routine and to increase amount of physical activity. Short Term: Attend rehab on a regular basis to increase amount of physical activity.;Long Term: Exercising regularly at least 3-5 days a week.;Long Term: Add in home exercise to make exercise part of routine and to increase amount of physical activity. Short Term: Attend rehab on a regular basis to increase amount of physical activity.;Long Term: Exercising regularly at least 3-5 days a week.;Long Term: Add in  home exercise to make exercise part of routine and to increase amount of physical activity. Short Term: Attend rehab on a regular basis to increase amount of physical activity.;Long Term: Exercising regularly at least 3-5 days a week.;Long Term: Add in home exercise to make exercise part of routine and to increase amount of physical activity.   Increase Strength and Stamina Yes Yes Yes Yes Yes   Intervention Provide advice, education, support and counseling about physical activity/exercise needs.;Develop an individualized exercise prescription for aerobic and resistive training based on initial evaluation findings, risk stratification, comorbidities and participant's personal goals. Provide advice, education, support and counseling about physical activity/exercise needs.;Develop an individualized exercise prescription for aerobic and resistive training based on initial evaluation findings, risk stratification, comorbidities and participant's personal goals. Provide advice, education, support and counseling about physical activity/exercise needs.;Develop an individualized exercise prescription for aerobic and resistive training based on initial evaluation findings, risk stratification, comorbidities and participant's  personal goals. Provide advice, education, support and counseling about physical activity/exercise needs.;Develop an individualized exercise prescription for aerobic and resistive training based on initial evaluation findings, risk stratification, comorbidities and participant's personal goals. Provide advice, education, support and counseling about physical activity/exercise needs.;Develop an individualized exercise prescription for aerobic and resistive training based on initial evaluation findings, risk stratification, comorbidities and participant's personal goals.   Expected Outcomes Short Term: Increase workloads from initial exercise prescription for resistance, speed, and METs.;Short Term: Perform resistance training exercises routinely during rehab and add in resistance training at home;Long Term: Improve cardiorespiratory fitness, muscular endurance and strength as measured by increased METs and functional capacity ( ) Short Term: Increase workloads from initial exercise prescription for resistance, speed, and METs.;Short Term: Perform resistance training exercises routinely during rehab and add in resistance training at home;Long Term: Improve cardiorespiratory fitness, muscular endurance and strength as measured by increased METs and functional capacity ( ) Short Term: Increase workloads from initial exercise prescription for resistance, speed, and METs.;Short Term: Perform resistance training exercises routinely during rehab and add in resistance training at home;Long Term: Improve cardiorespiratory fitness, muscular endurance and strength as measured by increased METs and functional capacity ( ) Short Term: Increase workloads from initial exercise prescription for resistance, speed, and METs.;Short Term: Perform resistance training exercises routinely during rehab and add in resistance training at home;Long Term: Improve cardiorespiratory fitness, muscular endurance and strength as measured  by increased METs and functional capacity ( ) Short Term: Increase workloads from initial exercise prescription for resistance, speed, and METs.;Short Term: Perform resistance training exercises routinely during rehab and add in resistance training at home;Long Term: Improve cardiorespiratory fitness, muscular endurance and strength as measured by increased METs and functional capacity ( )   Able to understand and use rate of perceived exertion (RPE) scale Yes Yes Yes Yes Yes   Intervention Provide education and explanation on how to use RPE scale Provide education and explanation on how to use RPE scale Provide education and explanation on how to use RPE scale Provide education and explanation on how to use RPE scale Provide education and explanation on how to use RPE scale   Expected Outcomes Short Term: Able to use RPE daily in rehab to express subjective intensity level;Long Term:  Able to use RPE to guide intensity level when exercising independently Short Term: Able to use RPE daily in rehab to express subjective intensity level;Long Term:  Able to use RPE to guide intensity level when exercising independently Short Term: Able to use RPE daily in rehab to express subjective intensity level;Long Term:  Able to use RPE to guide intensity level when exercising independently Short Term: Able to use RPE daily in rehab to express subjective intensity level;Long Term:  Able to use RPE to guide intensity level when exercising independently Short Term: Able to use RPE daily in rehab to express subjective intensity level;Long Term:  Able to use RPE to guide intensity level when exercising independently   Able to understand and use Dyspnea scale Yes Yes Yes Yes Yes   Intervention Provide education and explanation on how to use Dyspnea scale Provide education and explanation on how to use Dyspnea scale Provide education and explanation on how to use Dyspnea scale Provide education and explanation on how to use  Dyspnea scale Provide education and explanation on how to use Dyspnea scale   Expected Outcomes Short Term: Able to use Dyspnea scale daily in rehab to express subjective sense of shortness of breath during exertion;Long Term: Able to use Dyspnea scale to guide intensity level when exercising independently Short Term: Able to use Dyspnea scale daily in rehab to express subjective sense of shortness of breath during exertion;Long Term: Able to use Dyspnea scale to guide intensity level when exercising independently Short Term: Able to use Dyspnea scale daily in rehab to express subjective sense of shortness of breath during exertion;Long Term: Able to use Dyspnea scale to guide intensity level when exercising independently Short Term: Able to use Dyspnea scale daily in rehab to express subjective sense of shortness of breath during exertion;Long Term: Able to use Dyspnea scale to guide intensity level when exercising independently Short Term: Able to use Dyspnea scale daily in rehab to express subjective sense of shortness of breath during exertion;Long Term: Able to use Dyspnea scale to guide intensity level when exercising independently   Knowledge and understanding of Target Heart Rate Range (THRR) Yes Yes Yes Yes Yes   Intervention Provide education and explanation of THRR including how the numbers were predicted and where they are located for reference Provide education and explanation of THRR including how the numbers were predicted and where they are located for reference Provide education and explanation of THRR including how the numbers were predicted and where they are located for reference Provide education and explanation of THRR including how the numbers were predicted and where they are located for reference Provide education and explanation of THRR including how the numbers were predicted and where they are located for reference   Expected Outcomes Short Term: Able to state/look up THRR;Long Term:  Able to use THRR to govern intensity when exercising independently;Short Term: Able to use daily as guideline for intensity in rehab Short Term: Able to state/look up THRR;Long Term: Able to use THRR to govern intensity when exercising independently;Short Term: Able to use daily as guideline for intensity in rehab Short Term: Able to state/look up THRR;Long Term: Able to use THRR to govern intensity when exercising independently;Short Term: Able to use daily as guideline for intensity in rehab Short Term: Able to state/look up THRR;Long Term: Able to use THRR to govern intensity when exercising independently;Short Term: Able to use daily as guideline for intensity in rehab Short Term: Able to state/look up THRR;Long Term: Able to use THRR to govern intensity when exercising independently;Short Term: Able to use daily as guideline for intensity in rehab   Understanding of Exercise Prescription Yes Yes Yes Yes Yes   Intervention Provide education, explanation, and written materials on patient's individual exercise prescription Provide education, explanation, and written materials on patient's individual  exercise prescription Provide education, explanation, and written materials on patient's individual exercise prescription Provide education, explanation, and written materials on patient's individual exercise prescription Provide education, explanation, and written materials on patient's individual exercise prescription   Expected Outcomes Short Term: Able to explain program exercise prescription;Long Term: Able to explain home exercise prescription to exercise independently Short Term: Able to explain program exercise prescription;Long Term: Able to explain home exercise prescription to exercise independently Short Term: Able to explain program exercise prescription;Long Term: Able to explain home exercise prescription to exercise independently Short Term: Able to explain program exercise prescription;Long Term:  Able to explain home exercise prescription to exercise independently Short Term: Able to explain program exercise prescription;Long Term: Able to explain home exercise prescription to exercise independently            Exercise Goals Re-Evaluation :  Exercise Goals Re-Evaluation     Row Name 04/28/23 1052 05/19/23 1039 06/19/23 1228 07/18/23 1557 07/27/23 1552     Exercise Goal Re-Evaluation   Exercise Goals Review Increase Physical Activity;Able to understand and use Dyspnea scale;Understanding of Exercise Prescription;Increase Strength and Stamina;Knowledge and understanding of Target Heart Rate Range (THRR);Able to understand and use rate of perceived exertion (RPE) scale Increase Physical Activity;Able to understand and use Dyspnea scale;Understanding of Exercise Prescription;Increase Strength and Stamina;Knowledge and understanding of Target Heart Rate Range (THRR);Able to understand and use rate of perceived exertion (RPE) scale Increase Physical Activity;Able to understand and use Dyspnea scale;Understanding of Exercise Prescription;Increase Strength and Stamina;Knowledge and understanding of Target Heart Rate Range (THRR);Able to understand and use rate of perceived exertion (RPE) scale Increase Physical Activity;Able to understand and use Dyspnea scale;Understanding of Exercise Prescription;Increase Strength and Stamina;Knowledge and understanding of Target Heart Rate Range (THRR);Able to understand and use rate of perceived exertion (RPE) scale Increase Physical Activity;Able to understand and use Dyspnea scale;Understanding of Exercise Prescription;Increase Strength and Stamina;Knowledge and understanding of Target Heart Rate Range (THRR);Able to understand and use rate of perceived exertion (RPE) scale   Comments Skip is scheduled to begin exercise next week. Will continue to monitor and progress as able. Skip has completed 5 exercise sessions. He exercises for 15 min on the Nustep and  track. Pt averages 2.7 METs at level 3 on the Nustep and 2.23 METs on the track. Skip performs the warmup and cooldown standing/ seated dependent on his shortness of breath. He has increased his workload for the Nustep as METs have slightly increased. It is too soon to notate any major progressions. Will continue to monitor and progress as able. Skip has completed 13 exercise sessions. He exercises for 15 min on the Nustep and track. Pt averages 3.4 METs at level 3 on the Nustep and 2.08 METs on the track. Skip performs the warmup and cooldown standing/ seated dependent on his shortness of breath. His METs have increased for the Nustep and have remained the same for the track. Skip has most likely reached his functional capacity. Skip sits less during the warmup and cooldown. He feels that he have improved since starting PR. Skip has completed 19 exercise sessions. He exercises for 15 min on the Nustep and track. Pt averages 2.7 METs at level 3 on the Nustep and 1.77 METs on the track. Skip performs the warmup and cooldown standing/ seated dependent on his shortness of breath. Skip's METs have remained relatively the same because he has had other health issues and had to miss class. He is motivated to exercise as he tried to exercise at  his best ability today. We have discussed home exercise. Skip is trying exercise at home as often as he can. Will continue to monitor and progress as able. Skip completed 19 exercise sessions. Peak METs were 3.4 on the Nustep and 1.77 on the track. He is being discharged due to hospitalization.   Expected Outcomes Through exercise at rehab and home, the patient will decrease shortness of breath with daily activities and feel confident in carrying out an exercise regimen at home. Through exercise at rehab and home, the patient will decrease shortness of breath with daily activities and feel confident in carrying out an exercise regimen at home. Through exercise at rehab and home, the  patient will decrease shortness of breath with daily activities and feel confident in carrying out an exercise regimen at home. Through exercise at rehab and home, the patient will decrease shortness of breath with daily activities and feel confident in carrying out an exercise regimen at home. Through exercise at rehab and home, the patient will decrease shortness of breath with daily activities and feel confident in carrying out an exercise regimen at home.    Row Name 09/18/23 0813             Exercise Goal Re-Evaluation   Exercise Goals Review Increase Physical Activity;Able to understand and use Dyspnea scale;Understanding of Exercise Prescription;Increase Strength and Stamina;Knowledge and understanding of Target Heart Rate Range (THRR);Able to understand and use rate of perceived exertion (RPE) scale       Comments Skip is scheduled begin exercise again on 12/3.       Expected Outcomes Through exercise at rehab and home, the patient will decrease shortness of breath with daily activities and feel confident in carrying out an exercise regimen at home.                Discharge Exercise Prescription (Final Exercise Prescription Changes):  Exercise Prescription Changes - 07/06/23 0947       Response to Exercise   Blood Pressure (Admit) 116/64    Blood Pressure (Exit) 104/60    Heart Rate (Admit) 115 bpm    Heart Rate (Exercise) 122 bpm    Heart Rate (Exit) 109 bpm    Oxygen Saturation (Admit) 100 %    Oxygen Saturation (Exercise) 96 %    Oxygen Saturation (Exit) 100 %    Rating of Perceived Exertion (Exercise) 15    Perceived Dyspnea (Exercise) 3    Duration Continue with 30 min of aerobic exercise without signs/symptoms of physical distress.    Intensity THRR unchanged      Progression   Progression Continue to progress workloads to maintain intensity without signs/symptoms of physical distress.    Average METs 2.7      Resistance Training   Training Prescription Yes     Weight red bands    Reps 10-15    Time 10 Minutes      Oxygen   Oxygen Continuous    Liters 2      NuStep   Level 3    SPM 71    Minutes 15    METs 2.7      Track   Laps 5.5    Minutes 15    METs 1.82      Oxygen   Maintain Oxygen Saturation 88% or higher             Nutrition:  Target Goals: Understanding of nutrition guidelines, daily intake of sodium 1500mg , cholesterol 200mg , calories 30% from  fat and 7% or less from saturated fats, daily to have 5 or more servings of fruits and vegetables.  Biometrics:  Pre Biometrics - 09/18/23 0805       Pre Biometrics   Grip Strength 22 kg              Nutrition Therapy Plan and Nutrition Goals:  Nutrition Therapy & Goals - 07/25/23 1140       Nutrition Therapy   Diet Heart Healthy Diet    Drug/Food Interactions Statins/Certain Fruits      Personal Nutrition Goals   Nutrition Goal Patient to identify strategies for weight maintenance/ weight gain of 0.5-2.0# per week.   goal in progress.   Comments Goals in progress. Skip continues regular follow-up for kidney transplant; he has required dialysis since heart transplant in 2021. He currently receives hemodialysis M/W/F. His weight has been stable since hospitalization related to pneumonia in May 2024, and per diaylsis notes, his typical weight has been ~60kg. He is down about 3.5# since orientation to our program (start weight was 61.1kg). He lives with his sister who is a great support and does all of the grocery shopping and cooking. Skip reports drinking 1 Boost Breeze daily (250kcals, 9g protein) and 1 [employer] Milkshake (580kcals, 13g protein) daily to aid with weight gain/weight maintenance. We have discussed multiple strategies for weight gain including increasing eating frequency, protein supplements, increasing calories from fat/protein/carbohydrates, etc. He is currently hospitalized for acute respiratory failure; will continue to monitor goals upon  patient's return. Patient will benefit from participation in pulmonary rehab for nutrition, exercise, and lifestyle modification.      Intervention Plan   Intervention Prescribe, educate and counsel regarding individualized specific dietary modifications aiming towards targeted core components such as weight, hypertension, lipid management, diabetes, heart failure and other comorbidities.;Nutrition handout(s) given to patient.    Expected Outcomes Long Term Goal: Adherence to prescribed nutrition plan.;Short Term Goal: Understand basic principles of dietary content, such as calories, fat, sodium, cholesterol and nutrients.             Nutrition Assessments:  Nutrition Assessments - 05/02/23 1113       Rate Your Plate Scores   Pre Score 62            MEDIFICTS Score Key: >=70 Need to make dietary changes  40-70 Heart Healthy Diet <= 40 Therapeutic Level Cholesterol Diet  Flowsheet Row PULMONARY REHAB OTHER RESPIRATORY from 05/02/2023 in [facility]  Picture Your Plate Total Score on Admission 62      Picture Your Plate Scores: <16 Unhealthy dietary pattern with much room for improvement. 41-50 Dietary pattern unlikely to meet recommendations for good health and room for improvement. 51-60 More healthful dietary pattern, with some room for improvement.  >60 Healthy dietary pattern, although there may be some specific behaviors that could be improved.    Nutrition Goals Re-Evaluation:  Nutrition Goals Re-Evaluation     Row Name 05/02/23 1010 05/30/23 1106 06/29/23 0934 07/25/23 1140       Goals   Current Weight 134 lb 11.2 oz (61.1 kg) 132 lb 0.9 oz (59.9 kg) 133 lb 9.6 oz (60.6 kg) 131 lb 2.8 oz (59.5 kg)    Comment GFR 11, Cr 5.5, PTH 194, triglycerides 161, HDL 34, Albumin 2.3 he continues regular follow-up with [facility] s/p heart transplant. Other most recent labs  GFR 11, Cr 5.5, PTH 194, triglycerides  161, HDL 34,  Albumin 2.3 he continues regular follow-up with [facility] s/p heart transplant. Other most recent labs GFR 11, Cr 5.5, PTH 194, triglycerides 161, HDL 34, Albumin 2.3 Cr 4.99, Phos 5.6, ZOX09; other most recent labs monitored by [facility] transplant team, triglycerides 161, HDL 34,    Expected Outcome Skip continues regular follow-up for kidney transplant; he has required dialysis since heart transplant in 2021. He currently receives hemodialysis M/W/F. His weight has been stable since hospitalization related to pneumonia in May 2024, and per diaylsis notes, his typical weight has been ~60kg. He lives with his sister who is a great support and does all of the grocery shopping and cooking. Skip reports drinking 1 Boost Breeze daily (250kcals, 9g protein) and 1 Chick-fil-a Milkshake (580kcals, 13g protein) daily to aid with weight gain/weight maintenance. Patient will benefit from participation in pulmonary rehab for nutrition, exercise, and lifestyle modification. Goals in progress. Skip continues regular follow-up for kidney transplant; he has required dialysis since heart transplant in 2021. He currently receives hemodialysis M/W/F. His weight has been stable since hospitalization related to pneumonia in May 2024, and per diaylsis notes, his typical weight has been ~60kg. He is down about 4.6# since orientation to our program (start weight was 61.1kg). He lives with his sister who is a great support and does all of the grocery shopping and cooking. Skip reports drinking 1 Boost Breeze daily (250kcals, 9g protein) and 1 Chick-fil-a Milkshake (580kcals, 13g protein) daily to aid with weight gain/weight maintenance. We have discussed multiple strategies for weight gain including increasing eating frequency, protein supplements, increasing calories from fat/protein/carbohydrates, etc. Patient will benefit from participation in pulmonary rehab for nutrition, exercise, and lifestyle modification. Goals in  progress. Skip continues regular follow-up for kidney transplant; he has required dialysis since heart transplant in 2021. He currently receives hemodialysis M/W/F. His weight has been stable since hospitalization related to pneumonia in May 2024, and per diaylsis notes, his typical weight has been ~60kg. He is down about 2.2# since orientation to our program (start weight was 61.1kg). He lives with his sister who is a great support and does all of the grocery shopping and cooking. Skip reports drinking 1 Boost Breeze daily (250kcals, 9g protein) and 1 Chick-fil-a Milkshake (580kcals, 13g protein) daily to aid with weight gain/weight maintenance. We have discussed multiple strategies for weight gain including increasing eating frequency, protein supplements, increasing calories from fat/protein/carbohydrates, etc. Patient will benefit from participation in pulmonary rehab for nutrition, exercise, and lifestyle modification. Goals in progress. Skip continues regular follow-up for kidney transplant; he has required dialysis since heart transplant in 2021. He currently receives hemodialysis M/W/F. His weight has been stable since hospitalization related to pneumonia in May 2024, and per diaylsis notes, his typical weight has been ~60kg. He is down about 3.5# since orientation to our program (start weight was 61.1kg). He lives with his sister who is a great support and does all of the grocery shopping and cooking. Skip reports drinking 1 Boost Breeze daily (250kcals, 9g protein) and 1 Chick-fil-a Milkshake (580kcals, 13g protein) daily to aid with weight gain/weight maintenance. We have discussed multiple strategies for weight gain including increasing eating frequency, protein supplements, increasing calories from fat/protein/carbohydrates, etc. He is currently hospitalized for acute respiratory failure; will continue to monitor goals upon patient's return. Patient will benefit from participation in pulmonary rehab for  nutrition, exercise, and lifestyle modification.             Nutrition Goals Discharge (Final Nutrition Goals Re-Evaluation):  Nutrition Goals Re-Evaluation - 07/25/23 1140       Goals   Current Weight 131 lb 2.8 oz (59.5 kg)    Comment Cr 4.99, Phos 5.6, ZOX09; other most recent labs monitored by [facility] transplant team, triglycerides 161, HDL 34,    Expected Outcome Goals in progress. Skip continues regular follow-up for kidney transplant; he has required dialysis since heart transplant in 2021. He currently receives hemodialysis M/W/F. His weight has been stable since hospitalization related to pneumonia in May 2024, and per diaylsis notes, his typical weight has been ~60kg. He is down about 3.5# since orientation to our program (start weight was 61.1kg). He lives with his sister who is a great support and does all of the grocery shopping and cooking. Skip reports drinking 1 Boost Breeze daily (250kcals, 9g protein) and 1 Chick-fil-a Milkshake (580kcals, 13g protein) daily to aid with weight gain/weight maintenance. We have discussed multiple strategies for weight gain including increasing eating frequency, protein supplements, increasing calories from fat/protein/carbohydrates, etc. He is currently hospitalized for acute respiratory failure; will continue to monitor goals upon patient's return. Patient will benefit from participation in pulmonary rehab for nutrition, exercise, and lifestyle modification.             Psychosocial: Target Goals: Acknowledge presence or absence of significant depression and/or stress, maximize coping skills, provide positive support system. Participant is able to verbalize types and ability to use techniques and skills needed for reducing stress and depression.  Initial Review & Psychosocial Screening:  Initial Psych Review & Screening - 09/18/23 0810       Initial Review   Current issues with None Identified      Family Dynamics   Good Support System?  Yes    Comments sister and other family members      Barriers   Psychosocial barriers to participate in program There are no identifiable barriers or psychosocial needs.      Screening Interventions   Interventions Encouraged to exercise             Quality of Life Scores:  Scores of 19 and below usually indicate a poorer quality of life in these areas.  A difference of  2-3 points is a clinically meaningful difference.  A difference of 2-3 points in the total score of the Quality of Life Index has been associated with significant improvement in overall quality of life, self-image, physical symptoms, and general health in studies assessing change in quality of life.  PHQ-9: Review Flowsheet       09/18/2023 04/24/2023  Depression screen PHQ 2/9  Decreased Interest 0 0  Down, Depressed, Hopeless 0 0  PHQ - 2 Score 0 0  Altered sleeping 1 0  Tired, decreased energy 0 0  Change in appetite 0 0  Feeling bad or failure about yourself  0 0  Trouble concentrating 0 0  Moving slowly or fidgety/restless 0 0  Suicidal thoughts 0 0  PHQ-9 Score 1 0  Difficult doing work/chores Not difficult at all Not difficult at all    Details           Interpretation of Total Score  Total Score Depression Severity:  1-4 = Minimal depression, 5-9 = Mild depression, 10-14 = Moderate depression, 15-19 = Moderately severe depression, 20-27 = Severe depression   Psychosocial Evaluation and Intervention:  Psychosocial Evaluation - 09/18/23 0810       Psychosocial Evaluation & Interventions   Interventions Encouraged to exercise with the program and follow  exercise prescription    Comments Skip denies any psychosocial barriers to exercise.    Expected Outcomes For Skip to participate in PR free of psychosocial barriers.    Continue Psychosocial Services  No Follow up required             Psychosocial Re-Evaluation:  Psychosocial Re-Evaluation     Row Name 04/26/23 1550 05/24/23 1223  06/19/23 1210 07/19/23 0913 09/20/23 0803     Psychosocial Re-Evaluation   Current issues with None Identified None Identified None Identified None Identified None Identified   Comments No change in psychosocial assessment since orientation 04/24/2023. No concerns identified No new psychosocial barriers or concerns at this time. Skip is enjoying PR and denies any stressors. He states that his mental health is doing "fine". Skip states that his sister does a good job taking care of him and monitoring his overnight oxygen saturations. He is tolerating his HD sessions on T, Th, Sat well. He stated he is getting worked up for a kidney transplant. Skip stated he is looking forward to traveling and going to baseball games with his sister. Skip denies any needs at this time. Skip had to be discharged from the program on 07/26/23. His sister called [place] and stated he was hospitalized for bilateral pneumonia. He has been transferred from [facility] to [facility]. Skip completed 19 sessions of Pulm Rehab. Skip never complained while in class, worked hard, even when he didn't feel well. Skip denied any psychosocial issues or concerns at his last re-evaluation. He has great support from his sister. Skip is scheduled to start the program on 09/26/23. No new barriers or concerns since orientation.   Expected Outcomes For Skip to attend PR without any barriers or concerns For Skip to attend PR without any barriers or concerns For Skip to attend PR without any barriers or concerns For Skip to continue to exercise for stress relief post hospitalization For Skip to participate in PR free of any psychosocial barriers or concerns.   Interventions Encouraged to attend Pulmonary Rehabilitation for the exercise Encouraged to attend Pulmonary Rehabilitation for the exercise Encouraged to attend Pulmonary Rehabilitation for the exercise -- Encouraged to attend Pulmonary Rehabilitation for the exercise   Continue Psychosocial Services  No  Follow up required No Follow up required No Follow up required No Follow up required No Follow up required            Psychosocial Discharge (Final Psychosocial Re-Evaluation):  Psychosocial Re-Evaluation - 09/20/23 0803       Psychosocial Re-Evaluation   Current issues with None Identified    Comments Skip is scheduled to start the program on 09/26/23. No new barriers or concerns since orientation.    Expected Outcomes For Skip to participate in PR free of any psychosocial barriers or concerns.    Interventions Encouraged to attend Pulmonary Rehabilitation for the exercise    Continue Psychosocial Services  No Follow up required             Education: Education Goals: Education classes will be provided on a weekly basis, covering required topics. Participant will state understanding/return demonstration of topics presented.  Learning Barriers/Preferences:  Learning Barriers/Preferences - 04/24/23 0924       Learning Barriers/Preferences   Learning Barriers Sight    Learning Preferences Skilled Demonstration             Education Topics: Know Your Numbers Group instruction that is supported by a PowerPoint presentation. Instructor discusses importance of knowing and  understanding resting, exercise, and post-exercise oxygen saturation, heart rate, and blood pressure. Oxygen saturation, heart rate, blood pressure, rating of perceived exertion, and dyspnea are reviewed along with a normal range for these values.  Flowsheet Row PULMONARY REHAB OTHER RESPIRATORY from 05/04/2023 in [facility] for Heart, Vascular, & Lung Health  Date 05/04/23  Educator EP  Instruction Review Code 1- Verbalizes Understanding       Exercise for the Pulmonary Patient Group instruction that is supported by a PowerPoint presentation. Instructor discusses benefits of exercise, core components of exercise, frequency, duration, and intensity of an exercise routine,  importance of utilizing pulse oximetry during exercise, safety while exercising, and options of places to exercise outside of rehab.    MET Level  Group instruction provided by PowerPoint, verbal discussion, and written material to support subject matter. Instructor reviews what METs are and how to increase METs.  Flowsheet Row PULMONARY REHAB OTHER RESPIRATORY from 06/22/2023 in [facility] for Heart, Vascular, & Lung Health  Date 06/22/23  Educator EP  Instruction Review Code 1- Verbalizes Understanding       Pulmonary Medications Verbally interactive group education provided by instructor with focus on inhaled medications and proper administration.   Anatomy and Physiology of the Respiratory System Group instruction provided by PowerPoint, verbal discussion, and written material to support subject matter. Instructor reviews respiratory cycle and anatomical components of the respiratory system and their functions. Instructor also reviews differences in obstructive and restrictive respiratory diseases with examples of each.  Flowsheet Row PULMONARY REHAB OTHER RESPIRATORY from 07/06/2023 in [facility] for Heart, Vascular, & Lung Health  Date 07/06/23  Educator RT  Instruction Review Code 1- Verbalizes Understanding       Oxygen Safety Group instruction provided by PowerPoint, verbal discussion, and written material to support subject matter. There is an overview of "What is Oxygen" and "Why do we need it".  Instructor also reviews how to create a safe environment for oxygen use, the importance of using oxygen as prescribed, and the risks of noncompliance. There is a brief discussion on traveling with oxygen and resources the patient may utilize. Flowsheet Row PULMONARY REHAB OTHER RESPIRATORY from 05/11/2023 in [facility] for Heart, Vascular, & Lung Health  Date 05/11/23  Educator RN  Instruction Review Code 1-  Verbalizes Understanding       Oxygen Use Group instruction provided by PowerPoint, verbal discussion, and written material to discuss how supplemental oxygen is prescribed and different types of oxygen supply systems. Resources for more information are provided.    Breathing Techniques Group instruction that is supported by demonstration and informational handouts. Instructor discusses the benefits of pursed lip and diaphragmatic breathing and detailed demonstration on how to perform both.  Flowsheet Row PULMONARY REHAB OTHER RESPIRATORY from 05/25/2023 in [facility] for Heart, Vascular, & Lung Health  Date 05/25/23  Educator RN  Instruction Review Code 1- Verbalizes Understanding        Risk Factor Reduction Group instruction that is supported by a PowerPoint presentation. Instructor discusses the definition of a risk factor, different risk factors for pulmonary disease, and how the heart and lungs work together. Flowsheet Row PULMONARY REHAB OTHER RESPIRATORY from 06/15/2023 in [facility] for Heart, Vascular, & Lung Health  Date 06/15/23  Educator EP  Instruction Review Code 1- Verbalizes Understanding       Pulmonary Diseases Group instruction provided by PowerPoint, verbal  discussion, and written material to support subject matter. Instructor gives an overview of the different type of pulmonary diseases. There is also a discussion on risk factors and symptoms as well as ways to manage the diseases.   Stress and Energy Conservation Group instruction provided by PowerPoint, verbal discussion, and written material to support subject matter. Instructor gives an overview of stress and the impact it can have on the body. Instructor also reviews ways to reduce stress. There is also a discussion on energy conservation and ways to conserve energy throughout the day.   Warning Signs and Symptoms Group instruction provided by PowerPoint,  verbal discussion, and written material to support subject matter. Instructor reviews warning signs and symptoms of stroke, heart attack, cold and flu. Instructor also reviews ways to prevent the spread of infection.   Other Education Group or individual verbal, written, or video instructions that support the educational goals of the pulmonary rehab program. Flowsheet Row PULMONARY REHAB OTHER RESPIRATORY from 06/29/2023 in [facility] for Heart, Vascular, & Lung Health  Date 06/29/23  Educator RT  Instruction Review Code 1- Verbalizes Understanding        Knowledge Questionnaire Score:  Knowledge Questionnaire Score - 04/24/23 0102       Knowledge Questionnaire Score   Pre Score 18/18             Core Components/Risk Factors/Patient Goals at Admission:  Personal Goals and Risk Factors at Admission - 09/18/23 0913       Core Components/Risk Factors/Patient Goals on Admission    Weight Management --    Improve shortness of breath with ADL's Yes    Intervention Provide education, individualized exercise plan and daily activity instruction to help decrease symptoms of SOB with activities of daily living.    Expected Outcomes Short Term: Improve cardiorespiratory fitness to achieve a reduction of symptoms when performing ADLs;Long Term: Be able to perform more ADLs without symptoms or delay the onset of symptoms             Core Components/Risk Factors/Patient Goals Review:   Goals and Risk Factor Review     Row Name 04/26/23 1552 05/24/23 1226 06/19/23 1244 07/19/23 0917 09/20/23 0805     Core Components/Risk Factors/Patient Goals Review   Personal Goals Review Weight Management/Obesity;Develop more efficient breathing techniques such as purse lipped breathing and diaphragmatic breathing and practicing self-pacing with activity.;Improve shortness of breath with ADL's Weight Management/Obesity;Develop more efficient breathing techniques such as purse  lipped breathing and diaphragmatic breathing and practicing self-pacing with activity.;Improve shortness of breath with ADL's Weight Management/Obesity;Develop more efficient breathing techniques such as purse lipped breathing and diaphragmatic breathing and practicing self-pacing with activity.;Improve shortness of breath with ADL's Weight Management/Obesity;Improve shortness of breath with ADL's Improve shortness of breath with ADL's;Develop more efficient breathing techniques such as purse lipped breathing and diaphragmatic breathing and practicing self-pacing with activity.   Review Unable to evaluate any progress towards goals yet. Skip is scheduled to begin on 7/9. Goal progressing for weight gain. Skip is working with staff dietician to obtain weight gain goal. Goal progressing for improve shortness of breath with ADL's. Goal progressing on developing more efficient breathing techniques such as purse lipped breathing and diaphragmatic breathing. Goal not progressing for weight gain. Skip has not gained any weight since starting the program. Skip and his sister are working with our staff dietician to gain weight. He is eating a renal diet. Goal progressing for improving shortness of breath with ADL's.  We have weaned Skip off his oxygen since starting the program. He still uses oxygen at night. O2 saturations have been > 88% while exercising. Goal met on developing more efficient breathing techniques such as purse lipped breathing and diaphragmatic breathing. Skip can initiate PLB on when he is short of breath while exercising. He states he has been practicing diaphragmatic breathing at home. Skip is enjoying the class and stated he can see the progress. Skip had to be discharged from the program on 07/26/23 after completing 19 sessions. Skip is currently hospitalized for bilateral pneumonia, and his plan is to go to skilled nursing after discharge. Core components/risk factors/patient goals are as follows:  Goal not met for weight gain. Skip is down ~2.5# since orientation. Our dietician has spoken to both Skip and his sister for weight gain advice. Due to his recent illness, Skip had decreased appetite. Goal not met for improving shortness of breath with ADL's. We had weaned Skip off his oxygen during class, but due to his recent pleural effusion, thoracentesis, and bilateral pneumonia he had to be placed back on oxygen. Skip is scheduled to start PR on 09/26/23. Goal progressing for improving shortness of breath with ADL's. Goal progressing for developing more efficient breathing techniques such as purse lipped breathing and diaphragmatic breathing; and practicing self-pacing with activity.   Expected Outcomes For Skip to gain weight, improve his shortness of breath with ADLs, and develop more efficient breathing techniques. See admission goals For Skip to gain weight, improve his shortness of breath with his ADLs & develop more efficient breathing techniques For Skip to gain weight and improve SOB with his ADL's post pulmonary rehab For Skip to improve shortness of breath with ADL's and develop more efficient breathing techniques such as purse lipped breathing and diaphragmatic breathing; and practicing self-pacing with activity.            Core Components/Risk Factors/Patient Goals at Discharge (Final Review):   Goals and Risk Factor Review - 09/20/23 0805       Core Components/Risk Factors/Patient Goals Review   Personal Goals Review Improve shortness of breath with ADL's;Develop more efficient breathing techniques such as purse lipped breathing and diaphragmatic breathing and practicing self-pacing with activity.    Review Skip is scheduled to start PR on 09/26/23. Goal progressing for improving shortness of breath with ADL's. Goal progressing for developing more efficient breathing techniques such as purse lipped breathing and diaphragmatic breathing; and practicing self-pacing with activity.     Expected Outcomes For Skip to improve shortness of breath with ADL's and develop more efficient breathing techniques such as purse lipped breathing and diaphragmatic breathing; and practicing self-pacing with activity.             ITP Comments: Pt is making expected progress toward Pulmonary Rehab goals after completing 0 session(s). Recommend continued exercise, life style modification, education, and utilization of breathing techniques to increase stamina and strength, while also decreasing shortness of breath with exertion.  Dr. Mechele Collin is Medical Director for Pulmonary Rehab at [facility].     Comments:

## 2023-09-25 NOTE — Telephone Encounter (Signed)
Dr. Judeth Horn, Please advise if it is ok to send an order to [employer] to get patient a 5L concentrator instead of the 10L concentrator.  He states he is only using 2L.  Thank you.

## 2023-09-26 ENCOUNTER — Encounter (HOSPITAL_COMMUNITY)
Admission: RE | Admit: 2023-09-26 | Discharge: 2023-09-26 | Disposition: A | Discharge status: Home / Self Care | Payer: Medicare Other | Source: Ambulatory Visit | Attending: Pulmonary Disease | Admitting: Pulmonary Disease

## 2023-09-26 DIAGNOSIS — J849 Interstitial pulmonary disease, unspecified: Secondary | ICD-10-CM | POA: Diagnosis present

## 2023-09-26 NOTE — Progress Notes (Signed)
Daily Session Note  Patient Details  Name: Rickey Erickson MRN: [id number] Date of Birth: 1956/03/20 Referring Provider:   [name] Pulmonary Rehab Walk Test from 09/18/2023 in [facility] for Heart, Vascular, & Lung Health  Referring Provider [name]       Encounter Date: 09/26/2023  Check In:  Session Check In - 09/26/23 0817       Check-In   Supervising physician immediately available to respond to emergencies [facility] MD immediately available    Physician(s) Eligha Bridegroom, NP    Location [facility]    Staff Present Raford Pitcher, MS, ACSM-CEP, Exercise Physiologist;Henri Baumler Dionisio Paschal, ACSM-CEP, Exercise Physiologist    Virtual Visit No    Medication changes reported     No    Fall or balance concerns reported    No    Tobacco Cessation No Change    Warm-up and Cool-down Performed as group-led instruction    Resistance Training Performed Yes    VAD Patient? No    PAD/SET Patient? No      Pain Assessment   Currently in Pain? No/denies             Capillary Blood Glucose: No results found for this or any previous visit (from the past 24 hour(s)).    Social History   Tobacco Use  Smoking Status Never  Smokeless Tobacco Never    Goals Met:  Exercise tolerated well No report of concerns or symptoms today Strength training completed today  Goals Unmet:  Not Applicable  Comments: Service time is from 0806 to 0916.    Dr. Mechele Collin is Medical Director for Pulmonary Rehab at [facility].

## 2023-09-27 ENCOUNTER — Telehealth (HOSPITAL_COMMUNITY): Payer: Self-pay

## 2023-09-27 NOTE — Telephone Encounter (Signed)
DME order placed and message sent to patient.

## 2023-09-27 NOTE — Telephone Encounter (Signed)
-----   Message from Lesia Sago [facility] sent at 09/27/2023 11:45 AM EST ----- Regarding: RE: Increase in THR during Pulmonary Rehab Yes ok to increase to 150 - thanks ----- Message ----- From: Essie Hart, RN Sent: 09/27/2023  11:27 AM EST To: Karren Burly, MD Subject: Increase in THR during Pulmonary Rehab         Can we please get an increase in Skip's target heart rate in pulmonary rehab from 136 to 150? His resting HR is around the mid 100s. With exercise HR around 140s.

## 2023-09-27 NOTE — Telephone Encounter (Signed)
Can someone please assist with New DME order for 5L concentrator?

## 2023-09-28 ENCOUNTER — Encounter (HOSPITAL_COMMUNITY)
Admission: RE | Admit: 2023-09-28 | Discharge: 2023-09-28 | Disposition: A | Discharge status: Home / Self Care | Payer: Medicare Other | Source: Ambulatory Visit | Attending: Pulmonary Disease | Admitting: Pulmonary Disease

## 2023-09-28 VITALS — Wt 121.9 lb

## 2023-09-28 DIAGNOSIS — J849 Interstitial pulmonary disease, unspecified: Secondary | ICD-10-CM

## 2023-09-28 NOTE — Progress Notes (Signed)
Daily Session Note  Patient Details  Name: Rickey Erickson MRN: [id number] Date of Birth: 1956-01-22 Referring Provider:   [name] Pulmonary Rehab Walk Test from 09/18/2023 in [facility] for Heart, Vascular, & Lung Health  Referring Provider [name]       Encounter Date: 09/28/2023  Check In:  Session Check In - 09/28/23 0816       Check-In   Supervising physician immediately available to respond to emergencies [facility] MD immediately available    Physician(s) Edd Fabian, NP    Location [facility]    Staff Present Raford Pitcher, MS, ACSM-CEP, Exercise Physiologist;Randi Dionisio Paschal, ACSM-CEP, Exercise Physiologist;Mary Gerre Scull, RN, Fuller Plan, RT    Virtual Visit No    Medication changes reported     No    Fall or balance concerns reported    No    Tobacco Cessation No Change    Warm-up and Cool-down Performed as group-led instruction    Resistance Training Performed Yes    VAD Patient? No    PAD/SET Patient? No      Pain Assessment   Currently in Pain? No/denies    Multiple Pain Sites No             Capillary Blood Glucose: No results found for this or any previous visit (from the past 24 hour(s)).    Social History   Tobacco Use  Smoking Status Never  Smokeless Tobacco Never    Goals Met:  Proper associated with RPD/PD & O2 Sat Independence with exercise equipment Exercise tolerated well No report of concerns or symptoms today Strength training completed today  Goals Unmet:  Not Applicable  Comments: Service time is from 0801 to 0921.    Dr. Mechele Collin is Medical Director for Pulmonary Rehab at [facility].

## 2023-10-03 ENCOUNTER — Encounter (HOSPITAL_COMMUNITY): Payer: Medicare Other

## 2023-10-03 ENCOUNTER — Ambulatory Visit: Admit: 2023-10-03 | Discharge: 2023-10-03 | Payer: MEDICARE

## 2023-10-03 DIAGNOSIS — M810 Age-related osteoporosis without current pathological fracture: Principal | ICD-10-CM

## 2023-10-03 DIAGNOSIS — N189 Chronic kidney disease, unspecified: Principal | ICD-10-CM

## 2023-10-03 DIAGNOSIS — M899 Disorder of bone, unspecified: Principal | ICD-10-CM

## 2023-10-05 ENCOUNTER — Encounter (HOSPITAL_COMMUNITY)
Admission: RE | Admit: 2023-10-05 | Discharge: 2023-10-05 | Disposition: A | Discharge status: Home / Self Care | Payer: Medicare Other | Source: Ambulatory Visit | Attending: Pulmonary Disease | Admitting: Pulmonary Disease

## 2023-10-05 DIAGNOSIS — J849 Interstitial pulmonary disease, unspecified: Secondary | ICD-10-CM

## 2023-10-05 NOTE — Progress Notes (Signed)
Daily Session Note  Patient Details  Name: Rickey Erickson MRN: [id number] Date of Birth: 12-28-1955 Referring Provider:   [name] Pulmonary Rehab Walk Test from 09/18/2023 in [facility] for Heart, Vascular, & Lung Health  Referring Provider [name]       Encounter Date: 10/05/2023  Check In:  Session Check In - 10/05/23 0819       Check-In   Supervising physician immediately available to respond to emergencies [facility] MD immediately available    Physician(s) Joni Reining, NP    Location [facility]    Staff Present Raford Pitcher, MS, ACSM-CEP, Exercise Physiologist;Randi Dionisio Paschal, ACSM-CEP, Exercise Physiologist;Mary Gerre Scull, RN, Fuller Plan, RT    Virtual Visit No    Medication changes reported     No    Fall or balance concerns reported    No    Tobacco Cessation No Change    Warm-up and Cool-down Performed as group-led instruction    Resistance Training Performed Yes    VAD Patient? No    PAD/SET Patient? No      Pain Assessment   Currently in Pain? No/denies    Multiple Pain Sites No             Capillary Blood Glucose: No results found for this or any previous visit (from the past 24 hours).    Social History   Tobacco Use  Smoking Status Never  Smokeless Tobacco Never    Goals Met:  Proper associated with RPD/PD & O2 Sat Independence with exercise equipment Exercise tolerated well No report of concerns or symptoms today Strength training completed today  Goals Unmet:  Not Applicable  Comments: Service time is from 0808 to 0915.    Dr. Mechele Collin is Medical Director for Pulmonary Rehab at [facility].

## 2023-10-10 ENCOUNTER — Encounter (HOSPITAL_COMMUNITY)
Admission: RE | Admit: 2023-10-10 | Discharge: 2023-10-10 | Disposition: A | Discharge status: Home / Self Care | Payer: Medicare Other | Source: Ambulatory Visit | Attending: Pulmonary Disease | Admitting: Pulmonary Disease

## 2023-10-10 DIAGNOSIS — J849 Interstitial pulmonary disease, unspecified: Secondary | ICD-10-CM

## 2023-10-10 NOTE — Progress Notes (Signed)
Daily Session Note  Patient Details  Name: Rickey Erickson MRN: [id number] Date of Birth: 06-16-1956 Referring Provider:   [name] Pulmonary Rehab Walk Test from 09/18/2023 in [facility] for Heart, Vascular, & Lung Health  Referring Provider [name]       Encounter Date: 10/10/2023  Check In:  Session Check In - 10/10/23 0831       Check-In   Supervising physician immediately available to respond to emergencies [facility] MD immediately available    Physician(s) Bernadene Person, NP    Location [facility]    Staff Present Raford Pitcher, MS, ACSM-CEP, Exercise Physiologist;Randi Dionisio Paschal, ACSM-CEP, Exercise Physiologist;Oralee Rapaport Katrinka Blazing, RT    Virtual Visit No    Medication changes reported     No    Fall or balance concerns reported    No    Tobacco Cessation No Change    Warm-up and Cool-down Performed as group-led instruction    Resistance Training Performed Yes    VAD Patient? No    PAD/SET Patient? No      Pain Assessment   Currently in Pain? No/denies    Multiple Pain Sites No             Capillary Blood Glucose: No results found for this or any previous visit (from the past 24 hours).    Social History   Tobacco Use  Smoking Status Never  Smokeless Tobacco Never    Goals Met:  Proper associated with RPD/PD & O2 Sat Independence with exercise equipment Exercise tolerated well No report of concerns or symptoms today Strength training completed today  Goals Unmet:  Not Applicable  Comments: Service time is from 0808 to 0912.    Dr. Mechele Collin is Medical Director for Pulmonary Rehab at [facility].

## 2023-10-12 ENCOUNTER — Encounter (HOSPITAL_COMMUNITY)
Admission: RE | Admit: 2023-10-12 | Discharge: 2023-10-12 | Disposition: A | Discharge status: Home / Self Care | Payer: Medicare Other | Source: Ambulatory Visit | Attending: Pulmonary Disease | Admitting: Pulmonary Disease

## 2023-10-12 VITALS — Wt 123.5 lb

## 2023-10-12 DIAGNOSIS — J849 Interstitial pulmonary disease, unspecified: Secondary | ICD-10-CM | POA: Diagnosis not present

## 2023-10-12 NOTE — Progress Notes (Signed)
Daily Session Note  Patient Details  Name: Rickey Erickson MRN: [id number] Date of Birth: 29-Mar-1956 Referring Provider:   [name] Pulmonary Rehab Walk Test from 09/18/2023 in [facility] for Heart, Vascular, & Lung Health  Referring Provider [name]       Encounter Date: 10/12/2023  Check In:  Session Check In - 10/12/23 4098       Check-In   Supervising physician immediately available to respond to emergencies [facility] MD immediately available    Physician(s) Robin Searing, NP    Location [facility]    Staff Present Raford Pitcher, MS, ACSM-CEP, Exercise Physiologist;Kaivon Livesey Synthia Innocent, RN, BSN    Virtual Visit No    Medication changes reported     No    Fall or balance concerns reported    No    Tobacco Cessation No Change    Warm-up and Cool-down Performed as group-led instruction    Resistance Training Performed Yes    VAD Patient? No    PAD/SET Patient? No      Pain Assessment   Currently in Pain? No/denies    Multiple Pain Sites No             Capillary Blood Glucose: No results found for this or any previous visit (from the past 24 hours).    Social History   Tobacco Use  Smoking Status Never  Smokeless Tobacco Never    Goals Met:  Proper associated with RPD/PD & O2 Sat Independence with exercise equipment Exercise tolerated well No report of concerns or symptoms today Strength training completed today  Goals Unmet:  Not Applicable  Comments: Service time is from 0806 to 0929.    Dr. Mechele Collin is Medical Director for Pulmonary Rehab at [facility].

## 2023-10-13 DIAGNOSIS — Z79899 Other long term (current) drug therapy: Principal | ICD-10-CM

## 2023-10-13 DIAGNOSIS — Z941 Heart transplant status: Principal | ICD-10-CM

## 2023-10-17 ENCOUNTER — Encounter (HOSPITAL_COMMUNITY)
Admission: RE | Admit: 2023-10-17 | Discharge: 2023-10-17 | Disposition: A | Discharge status: Home / Self Care | Payer: Medicare Other | Source: Ambulatory Visit | Attending: Pulmonary Disease

## 2023-10-17 DIAGNOSIS — J849 Interstitial pulmonary disease, unspecified: Secondary | ICD-10-CM | POA: Diagnosis not present

## 2023-10-17 NOTE — Progress Notes (Signed)
Daily Session Note  Patient Details  Name: Rickey Erickson MRN: [id number] Date of Birth: 04/22/1956 Referring Provider:   [name] Pulmonary Rehab Walk Test from 09/18/2023 in [facility]  Referring Provider [name]       Encounter Date: 10/12/2023  Check In:   Capillary Blood Glucose: No results found for this or any previous visit (from the past 24 hours).   Exercise Prescription Changes - 10/17/23 0900       Response to Exercise   Blood Pressure (Admit) 114/64    Blood Pressure (Exercise) 138/70    Blood Pressure (Exit) 132/68    Heart Rate (Admit) 112 bpm    Heart Rate (Exercise) 126 bpm    Heart Rate (Exit) 115 bpm    Oxygen Saturation (Admit) 100 %    Oxygen Saturation (Exercise) 95 %    Oxygen Saturation (Exit) 100 %    Rating of Perceived Exertion (Exercise) 12    Perceived Dyspnea (Exercise) 2    Duration Continue with 30 min of aerobic exercise without signs/symptoms of physical distress.    Intensity THRR unchanged      Resistance Training   Training Prescription Yes    Weight red bands    Reps 10-15    Time 10 Minutes      Recumbant Bike   Level 2    Minutes 15    METs 2.4      Track   Laps 6.5    Minutes 15    METs 2             Social History   Tobacco Use  Smoking Status Never  Smokeless Tobacco Never    Goals Met:  Proper associated with RPD/PD & O2 Sat Independence with exercise equipment Exercise tolerated well No report of concerns or symptoms today Strength training completed today  Goals Unmet:  Not Applicable  Comments: Service time is from 0814 to 0930.    Dr. Mechele Collin is Medical Director for Pulmonary Rehab at [facility].

## 2023-10-17 NOTE — Progress Notes (Signed)
Pulmonary Individual Treatment Plan  Patient Details  Name: Rickey Erickson MRN: [id number] Date of Birth: August 07, 1956 Referring Provider:   [name] Pulmonary Rehab Walk Test from 09/18/2023 in [facility]  Referring Provider [name]       Initial Encounter Date:  Flowsheet Row Pulmonary Rehab Walk Test from 09/18/2023 in [facility]  Date 09/18/23       Visit Diagnosis: ILD (interstitial lung disease) (HCC)  Patient's Home Medications on Admission:   Current Outpatient Medications:    acetaminophen (TYLENOL) 500 MG tablet, Take 500 mg by mouth every 6 (six) hours as needed for headache (pain)., Disp: , Rfl:    albumin human 25 % bottle, Inject 100 mLs (25 g total) into the vein as needed (Give during dialysis for SBP <100.)., Disp: , Rfl:    apixaban (ELIQUIS) 2.5 MG TABS tablet, Take 2.5 mg by mouth 2 (two) times daily., Disp: , Rfl:    atorvastatin (LIPITOR) 20 MG tablet, Take 20 mg by mouth every morning., Disp: , Rfl:    CALCIUM PO, Take 1 tablet by mouth 2 (two) times daily., Disp: , Rfl:    clopidogrel (PLAVIX) 75 MG tablet, Take 75 mg by mouth every morning., Disp: , Rfl:    Darbepoetin Alfa (ARANESP) 150 MCG/0.3ML SOSY injection, Inject 0.3 mLs (150 mcg total) into the skin every Wednesday at 6 PM., Disp: , Rfl:    midodrine (PROAMATINE) 10 MG tablet, Take 10 mg by mouth See admin instructions. Take 10 mg once daily prior to dialysis on Monday, Wednesday, Friday., Disp: , Rfl:    mirtazapine (REMERON) 30 MG tablet, Take 30 mg by mouth at bedtime., Disp: , Rfl:    pantoprazole (PROTONIX) 40 MG tablet, Take 40 mg by mouth every morning., Disp: , Rfl:    tacrolimus (PROGRAF) 1 MG capsule, Take 1 mg by mouth 2 (two) times daily. Take in addition to 5 mg capsule twice daily, for a total dose of 6 mg twice daily., Disp: , Rfl:    tacrolimus (PROGRAF) 5 MG capsule,  Take 5 mg by mouth 2 (two) times daily. Take in addition to 1 mg capsule twice daily, for a total dose of 6 mg twice daily., Disp: , Rfl:   Past Medical History: Past Medical History:  Diagnosis Date   Anemia    Heart failure (HCC)    Hypertension    Osteoporosis    Renal disorder     Tobacco Use: Social History   Tobacco Use  Smoking Status Never  Smokeless Tobacco Never    Labs: Review Flowsheet  More data may exist      Latest Ref Rng & Units 01/06/2022 09/08/2022 03/07/2023 07/21/2023 07/26/2023  Labs for ITP Cardiac and Pulmonary Rehab  Cholestrol 0 - 200 mg/dL 132  440  - - -  LDL (calc) 0 - 99 mg/dL 90  80  - - -  HDL-C >10 mg/dL 30  39  - - -  Trlycerides <150 mg/dL 90  86  - - -  Hemoglobin A1c 4.8 - 5.6 % 4.9  - - - -  PH, Arterial 7.35 - 7.45 - - 7.497  7.227  7.109  7.060  -  PCO2 arterial 32 - 48 mmHg - - 33.6  56.0  85.7  107.3  -  Bicarbonate 20.0 - 28.0 mmol/L - - 26.0  23.3  27.6  25.0  27.4  30.5  23.9  25.0   TCO2 22 - 32 mmol/L - - 27  25  30  27  30   34  -  Acid-base deficit 0.0 - 2.0 mmol/L - - - 5.0  4.0  6.0  4.0  3.0  6.0  4.7   O2 Saturation % - - 96  93  30  75  86  92  98.2  77.3     Details       Multiple values from one day are sorted in reverse-chronological order         Capillary Blood Glucose: Lab Results  Component Value Date   GLUCAP 80 07/27/2023   GLUCAP 92 07/21/2023   GLUCAP 86 07/21/2023     Pulmonary Assessment Scores:  Pulmonary Assessment Scores     Row Name 04/24/23 0935 09/18/23 0812       ADL UCSD   ADL Phase Entry Entry    SOB Score total 50 --      CAT Score   CAT Score 16 --      mMRC Score   mMRC Score 3 2            UCSD: Self-administered rating of dyspnea associated with activities of daily living (ADLs) 6-point scale (0 = "not at all" to 5 = "maximal or unable to do because of breathlessness")  Scoring Scores range from 0 to 120.  Minimally important difference is 5 units  CAT: CAT  can identify the health impairment of COPD patients and is better correlated with disease progression.  CAT has a scoring range of zero to 40. The CAT score is classified into four groups of low (less than 10), medium (10 - 20), high (21-30) and very high (31-40) based on the impact level of disease on health status. A CAT score over 10 suggests significant symptoms.  A worsening CAT score could be explained by an exacerbation, poor medication adherence, poor inhaler technique, or progression of COPD or comorbid conditions.  CAT MCID is 2 points  mMRC: mMRC (Modified Medical Research Council) Dyspnea Scale is used to assess the degree of baseline functional disability in patients of respiratory disease due to dyspnea. No minimal important difference is established. A decrease in score of 1 point or greater is considered a positive change.   Pulmonary Function Assessment:  Pulmonary Function Assessment - 09/18/23 0812       Breath   Bilateral Breath Sounds Decreased    Shortness of Breath Yes;Limiting activity             Exercise Target Goals: Exercise Program Goal: Individual exercise prescription set using results from initial 6 min walk test and THRR while considering  patient's activity barriers and safety.   Exercise Prescription Goal: Initial exercise prescription builds to 30-45 minutes a day of aerobic activity, 2-3 days per week.  Home exercise guidelines will be given to patient during program as part of exercise prescription that the participant will acknowledge.  Activity Barriers & Risk Stratification:  Activity Barriers & Cardiac Risk Stratification - 04/24/23 0936       Activity Barriers & Cardiac Risk Stratification   Activity Barriers Balance Concerns;Shortness of Breath;Muscular Weakness;Deconditioning   Rt arm herograft            6 Minute Walk:  6 Minute Walk     Row Name 04/24/23 1112 09/18/23 0906       6 Minute Walk   Phase Initial Initial  Distance 1015 feet 1140 feet    Walk Time 6 minutes 6 minutes    # of Rest Breaks 0 0    MPH 1.92 2.16    METS 3.36 3.68    RPE 11 13    Perceived Dyspnea  3 2    VO2 Peak 11.75 12.87    Symptoms No No    Resting HR 110 bpm 114 bpm    Resting BP 120/62 130/68    Resting Oxygen Saturation  98 % 95 %    Exercise Oxygen Saturation  during 6 min walk 95 % 92 %    Max Ex. HR 124 bpm 131 bpm    Max Ex. BP 132/64 134/68    2 Minute Post BP 122/64 122/70      Interval HR   1 Minute HR 114 122    2 Minute HR 118 128    3 Minute HR 121 129    4 Minute HR 123 128    5 Minute HR 123 131    6 Minute HR 124 131    2 Minute Post HR -- 119    Interval Heart Rate? Yes Yes      Interval Oxygen   Interval Oxygen? Yes Yes    Baseline Oxygen Saturation % 98 % 95 %    1 Minute Oxygen Saturation % 99 % 94 %    1 Minute Liters of Oxygen 2 L 0 L    2 Minute Oxygen Saturation % 97 % 94 %    2 Minute Liters of Oxygen 2 L 0 L    3 Minute Oxygen Saturation % 95 % 92 %    3 Minute Liters of Oxygen 2 L 0 L    4 Minute Oxygen Saturation % 97 % 92 %    4 Minute Liters of Oxygen 2 L 0 L    5 Minute Oxygen Saturation % 95 % 93 %    5 Minute Liters of Oxygen 2 L 0 L    6 Minute Oxygen Saturation % 96 % 92 %    6 Minute Liters of Oxygen 2 L 0 L    2 Minute Post Oxygen Saturation % 100 % 95 %    2 Minute Post Liters of Oxygen 2 L 0 L             Oxygen Initial Assessment:  Oxygen Initial Assessment - 09/18/23 0811       Home Oxygen   Home Oxygen Device None    Sleep Oxygen Prescription CPAP    Liters per minute 2    Home Exercise Oxygen Prescription None    Home Resting Oxygen Prescription None    Compliance with Home Oxygen Use Yes      Initial 6 min Walk   Oxygen Used None      Program Oxygen Prescription   Program Oxygen Prescription None      Intervention   Short Term Goals To learn and exhibit compliance with exercise, home and travel O2 prescription;To learn and understand  importance of maintaining oxygen saturations>88%;To learn and demonstrate proper use of respiratory medications;To learn and understand importance of monitoring SPO2 with pulse oximeter and demonstrate accurate use of the pulse oximeter.;To learn and demonstrate proper pursed lip breathing techniques or other breathing techniques.     Long  Term Goals Exhibits compliance with exercise, home  and travel O2 prescription;Verbalizes importance of monitoring SPO2 with pulse oximeter and return demonstration;Exhibits proper breathing techniques,  such as pursed lip breathing or other method taught during program session;Maintenance of O2 saturations>88%;Compliance with respiratory medication             Oxygen Re-Evaluation:  Oxygen Re-Evaluation     Row Name 04/28/23 1053 05/19/23 1043 06/19/23 1235 07/18/23 1601 10/05/23 1622     Program Oxygen Prescription   Program Oxygen Prescription Continuous Continuous Continuous Continuous None   Liters per minute 2 2 2 2  --     Home Oxygen   Home Oxygen Device E-Tanks;Home Concentrator E-Tanks;Home Concentrator E-Tanks;Home Concentrator E-Tanks;Home Concentrator None   Sleep Oxygen Prescription Continuous Continuous Continuous Continuous CPAP   Liters per minute 1 1 1 1 2    Home Exercise Oxygen Prescription Continuous Continuous Continuous Continuous None   Liters per minute 2 2 2 2  --   Home Resting Oxygen Prescription Continuous Continuous Continuous Continuous None   Liters per minute 2 2 2 2  --   Compliance with Home Oxygen Use Yes Yes Yes Yes Yes     Goals/Expected Outcomes   Short Term Goals To learn and exhibit compliance with exercise, home and travel O2 prescription;To learn and understand importance of maintaining oxygen saturations>88%;To learn and demonstrate proper use of respiratory medications;To learn and understand importance of monitoring SPO2 with pulse oximeter and demonstrate accurate use of the pulse oximeter.;To learn and  demonstrate proper pursed lip breathing techniques or other breathing techniques.  To learn and exhibit compliance with exercise, home and travel O2 prescription;To learn and understand importance of maintaining oxygen saturations>88%;To learn and demonstrate proper use of respiratory medications;To learn and understand importance of monitoring SPO2 with pulse oximeter and demonstrate accurate use of the pulse oximeter.;To learn and demonstrate proper pursed lip breathing techniques or other breathing techniques.  To learn and exhibit compliance with exercise, home and travel O2 prescription;To learn and understand importance of maintaining oxygen saturations>88%;To learn and demonstrate proper use of respiratory medications;To learn and understand importance of monitoring SPO2 with pulse oximeter and demonstrate accurate use of the pulse oximeter.;To learn and demonstrate proper pursed lip breathing techniques or other breathing techniques.  To learn and exhibit compliance with exercise, home and travel O2 prescription;To learn and understand importance of maintaining oxygen saturations>88%;To learn and demonstrate proper use of respiratory medications;To learn and understand importance of monitoring SPO2 with pulse oximeter and demonstrate accurate use of the pulse oximeter.;To learn and demonstrate proper pursed lip breathing techniques or other breathing techniques.  To learn and exhibit compliance with exercise, home and travel O2 prescription;To learn and understand importance of maintaining oxygen saturations>88%;To learn and demonstrate proper use of respiratory medications;To learn and understand importance of monitoring SPO2 with pulse oximeter and demonstrate accurate use of the pulse oximeter.;To learn and demonstrate proper pursed lip breathing techniques or other breathing techniques.    Long  Term Goals Exhibits compliance with exercise, home  and travel O2 prescription;Verbalizes importance of  monitoring SPO2 with pulse oximeter and return demonstration;Exhibits proper breathing techniques, such as pursed lip breathing or other method taught during program session;Maintenance of O2 saturations>88%;Compliance with respiratory medication Exhibits compliance with exercise, home  and travel O2 prescription;Verbalizes importance of monitoring SPO2 with pulse oximeter and return demonstration;Exhibits proper breathing techniques, such as pursed lip breathing or other method taught during program session;Maintenance of O2 saturations>88%;Compliance with respiratory medication Exhibits compliance with exercise, home  and travel O2 prescription;Verbalizes importance of monitoring SPO2 with pulse oximeter and return demonstration;Exhibits proper breathing techniques, such as pursed lip breathing or other method taught  during program session;Maintenance of O2 saturations>88%;Compliance with respiratory medication Exhibits compliance with exercise, home  and travel O2 prescription;Verbalizes importance of monitoring SPO2 with pulse oximeter and return demonstration;Exhibits proper breathing techniques, such as pursed lip breathing or other method taught during program session;Maintenance of O2 saturations>88%;Compliance with respiratory medication Exhibits compliance with exercise, home  and travel O2 prescription;Verbalizes importance of monitoring SPO2 with pulse oximeter and return demonstration;Exhibits proper breathing techniques, such as pursed lip breathing or other method taught during program session;Maintenance of O2 saturations>88%;Compliance with respiratory medication   Goals/Expected Outcomes Compliance and understanding of oxygen saturation monitoring and breathing techniques to decrease shortness of breath. Compliance and understanding of oxygen saturation monitoring and breathing techniques to decrease shortness of breath. Compliance and understanding of oxygen saturation monitoring and breathing  techniques to decrease shortness of breath. Compliance and understanding of oxygen saturation monitoring and breathing techniques to decrease shortness of breath. Compliance and understanding of oxygen saturation monitoring and breathing techniques to decrease shortness of breath.            Oxygen Discharge (Final Oxygen Re-Evaluation):  Oxygen Re-Evaluation - 10/05/23 1622       Program Oxygen Prescription   Program Oxygen Prescription None      Home Oxygen   Home Oxygen Device None    Sleep Oxygen Prescription CPAP    Liters per minute 2    Home Exercise Oxygen Prescription None    Home Resting Oxygen Prescription None    Compliance with Home Oxygen Use Yes      Goals/Expected Outcomes   Short Term Goals To learn and exhibit compliance with exercise, home and travel O2 prescription;To learn and understand importance of maintaining oxygen saturations>88%;To learn and demonstrate proper use of respiratory medications;To learn and understand importance of monitoring SPO2 with pulse oximeter and demonstrate accurate use of the pulse oximeter.;To learn and demonstrate proper pursed lip breathing techniques or other breathing techniques.     Long  Term Goals Exhibits compliance with exercise, home  and travel O2 prescription;Verbalizes importance of monitoring SPO2 with pulse oximeter and return demonstration;Exhibits proper breathing techniques, such as pursed lip breathing or other method taught during program session;Maintenance of O2 saturations>88%;Compliance with respiratory medication    Goals/Expected Outcomes Compliance and understanding of oxygen saturation monitoring and breathing techniques to decrease shortness of breath.             Initial Exercise Prescription:  Initial Exercise Prescription - 09/18/23 0900       Date of Initial Exercise RX and Referring Provider   Date 09/18/23    Referring Provider [name]    Expected Discharge Date 12/14/23      Oxygen    Maintain Oxygen Saturation 88% or higher      Recumbant Bike   Level 1    Minutes 15    METs 2.5      Track   Minutes 15    METs 2.5      Intensity   THRR 40-80% of Max Heartrate 62-124    Ratings of Perceived Exertion 11-13    Perceived Dyspnea 0-4      Progression   Progression Continue to progress workloads to maintain intensity without signs/symptoms of physical distress.      Resistance Training   Training Prescription Yes    Weight red bands    Reps 10-15             Perform Capillary Blood Glucose checks as needed.  Exercise Prescription Changes:  Exercise Prescription Changes     Row Name 05/02/23 0900 05/16/23 0900 05/30/23 0900 06/13/23 0900 06/27/23 0900     Response to Exercise   Blood Pressure (Admit) 118/70 124/64 120/64 130/70 114/68   Blood Pressure (Exercise) 140/70 142/62 160/72 172/66 124/58   Blood Pressure (Exit) 106/64 102/62 118/62 112/66 120/68   Heart Rate (Admit) 109 bpm 118 bpm 111 bpm 107 bpm 111 bpm   Heart Rate (Exercise) 119 bpm 126 bpm 124 bpm 126 bpm 124 bpm   Heart Rate (Exit) 111 bpm 116 bpm 117 bpm 107 bpm 95 bpm   Oxygen Saturation (Admit) 100 % 100 % 100 % 98 % 95 %   Oxygen Saturation (Exercise) 100 % 100 % 100 % 91 % 92 %   Oxygen Saturation (Exit) 100 % 100 % 100 % 96 % 97 %   Rating of Perceived Exertion (Exercise) 13 13 15 15 12    Perceived Dyspnea (Exercise) 1 3 3 4 1    Duration Continue with 30 min of aerobic exercise without signs/symptoms of physical distress. Continue with 30 min of aerobic exercise without signs/symptoms of physical distress. Continue with 30 min of aerobic exercise without signs/symptoms of physical distress. Continue with 30 min of aerobic exercise without signs/symptoms of physical distress. Continue with 30 min of aerobic exercise without signs/symptoms of physical distress.   Intensity THRR unchanged THRR unchanged THRR unchanged THRR unchanged THRR unchanged     Progression   Progression  Continue to progress workloads to maintain intensity without signs/symptoms of physical distress. Continue to progress workloads to maintain intensity without signs/symptoms of physical distress. Continue to progress workloads to maintain intensity without signs/symptoms of physical distress. Continue to progress workloads to maintain intensity without signs/symptoms of physical distress. Continue to progress workloads to maintain intensity without signs/symptoms of physical distress.     Resistance Training   Training Prescription Yes Yes Yes Yes Yes   Weight red bands red bands red bands red bands red bands   Reps 10-15 10-15 10-15 10-15 10-15   Time 10 Minutes 10 Minutes 10 Minutes 10 Minutes 10 Minutes     Oxygen   Oxygen Continuous Continuous --  Weaned to room air -- --   Liters 2 2 -- -- --     NuStep   Level 1 3 3 3 3    SPM -- -- 80 -- --   Minutes 15 15 15 15 15    METs 2.4 2.7 3.1 3 3      Track   Laps 7.5 8 5.5 5 5    Minutes 15 15 15 15 15    METs 2.23 2.23 1.85 1.77 1.77     Oxygen   Maintain Oxygen Saturation 88% or higher 88% or higher 88% or higher 88% or higher --    Row Name 07/06/23 0947 10/03/23 0900           Response to Exercise   Blood Pressure (Admit) 116/64 112/68      Blood Pressure (Exit) 104/60 112/64      Heart Rate (Admit) 115 bpm 118 bpm      Heart Rate (Exercise) 122 bpm 131 bpm      Heart Rate (Exit) 109 bpm 117 bpm      Oxygen Saturation (Admit) 100 % 100 %      Oxygen Saturation (Exercise) 96 % 96 %      Oxygen Saturation (Exit) 100 % 98 %      Rating of Perceived Exertion (Exercise) 15 13  Perceived Dyspnea (Exercise) 3 3      Duration Continue with 30 min of aerobic exercise without signs/symptoms of physical distress. Continue with 30 min of aerobic exercise without signs/symptoms of physical distress.      Intensity THRR unchanged THRR New  62-150        Progression   Progression Continue to progress workloads to maintain intensity  without signs/symptoms of physical distress. Continue to progress workloads to maintain intensity without signs/symptoms of physical distress.      Average METs 2.7 --        Resistance Training   Training Prescription Yes Yes      Weight red bands red bands      Reps 10-15 10-15      Time 10 Minutes 10 Minutes        Oxygen   Oxygen Continuous --      Liters 2 --        Recumbant Bike   Level -- 2      Minutes -- 15      METs -- 2.3        NuStep   Level 3 --      SPM 71 --      Minutes 15 --      METs 2.7 --        Track   Laps 5.5 6.5      Minutes 15 15      METs 1.82 2        Oxygen   Maintain Oxygen Saturation 88% or higher --               Exercise Comments:   Exercise Comments     Row Name 06/06/23 1539 09/26/23 0924         Exercise Comments Completed home [facility]. Skip is currently exercising at home. He uses the seated cycle machine and is walking 2-7 days/wk for 15-20 min/day. I encouraged him to increase his time to 30 min/day. Skip tries walking outside when the weather is good. I recommended he keep walking outside weather permitted. Skip agreed with my recommendations. I am confident in Skip completing an exercise regimen at home. He seems motivated to exercise and improve his functional capacity. Pt completed first day of group exercise. He exercised 15 min on the recumbent bike, level 2, METs 2.2. He then walked the track for 15 min, 2.0 METs, with seated breaks. Pt is feeling well and tolerated exercise better than last time he did pulmonary rehab. He performed warm up and cool down mostly standing, including squats. Discussed METS with good reception. Pt happy to be back.               Exercise Goals and Review:   Exercise Goals     Row Name 04/24/23 0936 04/28/23 1052 05/19/23 1039 06/19/23 1228 09/18/23 0813     Exercise Goals   Increase Physical Activity Yes Yes Yes Yes Yes   Intervention Provide advice, education, support and  counseling about physical activity/exercise needs.;Develop an individualized exercise prescription for aerobic and resistive training based on initial evaluation findings, risk stratification, comorbidities and participant's personal goals. Provide advice, education, support and counseling about physical activity/exercise needs.;Develop an individualized exercise prescription for aerobic and resistive training based on initial evaluation findings, risk stratification, comorbidities and participant's personal goals. Provide advice, education, support and counseling about physical activity/exercise needs.;Develop an individualized exercise prescription for aerobic and resistive training based on initial evaluation findings, risk stratification, comorbidities  and participant's personal goals. Provide advice, education, support and counseling about physical activity/exercise needs.;Develop an individualized exercise prescription for aerobic and resistive training based on initial evaluation findings, risk stratification, comorbidities and participant's personal goals. Provide advice, education, support and counseling about physical activity/exercise needs.;Develop an individualized exercise prescription for aerobic and resistive training based on initial evaluation findings, risk stratification, comorbidities and participant's personal goals.   Expected Outcomes Short Term: Attend rehab on a regular basis to increase amount of physical activity.;Long Term: Exercising regularly at least 3-5 days a week.;Long Term: Add in home exercise to make exercise part of routine and to increase amount of physical activity. Short Term: Attend rehab on a regular basis to increase amount of physical activity.;Long Term: Exercising regularly at least 3-5 days a week.;Long Term: Add in home exercise to make exercise part of routine and to increase amount of physical activity. Short Term: Attend rehab on a regular basis to increase  amount of physical activity.;Long Term: Exercising regularly at least 3-5 days a week.;Long Term: Add in home exercise to make exercise part of routine and to increase amount of physical activity. Short Term: Attend rehab on a regular basis to increase amount of physical activity.;Long Term: Exercising regularly at least 3-5 days a week.;Long Term: Add in home exercise to make exercise part of routine and to increase amount of physical activity. Short Term: Attend rehab on a regular basis to increase amount of physical activity.;Long Term: Exercising regularly at least 3-5 days a week.;Long Term: Add in home exercise to make exercise part of routine and to increase amount of physical activity.   Increase Strength and Stamina Yes Yes Yes Yes Yes   Intervention Provide advice, education, support and counseling about physical activity/exercise needs.;Develop an individualized exercise prescription for aerobic and resistive training based on initial evaluation findings, risk stratification, comorbidities and participant's personal goals. Provide advice, education, support and counseling about physical activity/exercise needs.;Develop an individualized exercise prescription for aerobic and resistive training based on initial evaluation findings, risk stratification, comorbidities and participant's personal goals. Provide advice, education, support and counseling about physical activity/exercise needs.;Develop an individualized exercise prescription for aerobic and resistive training based on initial evaluation findings, risk stratification, comorbidities and participant's personal goals. Provide advice, education, support and counseling about physical activity/exercise needs.;Develop an individualized exercise prescription for aerobic and resistive training based on initial evaluation findings, risk stratification, comorbidities and participant's personal goals. Provide advice, education, support and counseling about  physical activity/exercise needs.;Develop an individualized exercise prescription for aerobic and resistive training based on initial evaluation findings, risk stratification, comorbidities and participant's personal goals.   Expected Outcomes Short Term: Increase workloads from initial exercise prescription for resistance, speed, and METs.;Short Term: Perform resistance training exercises routinely during rehab and add in resistance training at home;Long Term: Improve cardiorespiratory fitness, muscular endurance and strength as measured by increased METs and functional capacity ( ) Short Term: Increase workloads from initial exercise prescription for resistance, speed, and METs.;Short Term: Perform resistance training exercises routinely during rehab and add in resistance training at home;Long Term: Improve cardiorespiratory fitness, muscular endurance and strength as measured by increased METs and functional capacity ( ) Short Term: Increase workloads from initial exercise prescription for resistance, speed, and METs.;Short Term: Perform resistance training exercises routinely during rehab and add in resistance training at home;Long Term: Improve cardiorespiratory fitness, muscular endurance and strength as measured by increased METs and functional capacity ( ) Short Term: Increase workloads from initial exercise prescription for resistance, speed, and METs.;Short Term: Perform  resistance training exercises routinely during rehab and add in resistance training at home;Long Term: Improve cardiorespiratory fitness, muscular endurance and strength as measured by increased METs and functional capacity ( ) Short Term: Increase workloads from initial exercise prescription for resistance, speed, and METs.;Short Term: Perform resistance training exercises routinely during rehab and add in resistance training at home;Long Term: Improve cardiorespiratory fitness, muscular endurance and strength as measured by  increased METs and functional capacity ( )   Able to understand and use rate of perceived exertion (RPE) scale Yes Yes Yes Yes Yes   Intervention Provide education and explanation on how to use RPE scale Provide education and explanation on how to use RPE scale Provide education and explanation on how to use RPE scale Provide education and explanation on how to use RPE scale Provide education and explanation on how to use RPE scale   Expected Outcomes Short Term: Able to use RPE daily in rehab to express subjective intensity level;Long Term:  Able to use RPE to guide intensity level when exercising independently Short Term: Able to use RPE daily in rehab to express subjective intensity level;Long Term:  Able to use RPE to guide intensity level when exercising independently Short Term: Able to use RPE daily in rehab to express subjective intensity level;Long Term:  Able to use RPE to guide intensity level when exercising independently Short Term: Able to use RPE daily in rehab to express subjective intensity level;Long Term:  Able to use RPE to guide intensity level when exercising independently Short Term: Able to use RPE daily in rehab to express subjective intensity level;Long Term:  Able to use RPE to guide intensity level when exercising independently   Able to understand and use Dyspnea scale Yes Yes Yes Yes Yes   Intervention Provide education and explanation on how to use Dyspnea scale Provide education and explanation on how to use Dyspnea scale Provide education and explanation on how to use Dyspnea scale Provide education and explanation on how to use Dyspnea scale Provide education and explanation on how to use Dyspnea scale   Expected Outcomes Short Term: Able to use Dyspnea scale daily in rehab to express subjective sense of shortness of breath during exertion;Long Term: Able to use Dyspnea scale to guide intensity level when exercising independently Short Term: Able to use Dyspnea scale daily  in rehab to express subjective sense of shortness of breath during exertion;Long Term: Able to use Dyspnea scale to guide intensity level when exercising independently Short Term: Able to use Dyspnea scale daily in rehab to express subjective sense of shortness of breath during exertion;Long Term: Able to use Dyspnea scale to guide intensity level when exercising independently Short Term: Able to use Dyspnea scale daily in rehab to express subjective sense of shortness of breath during exertion;Long Term: Able to use Dyspnea scale to guide intensity level when exercising independently Short Term: Able to use Dyspnea scale daily in rehab to express subjective sense of shortness of breath during exertion;Long Term: Able to use Dyspnea scale to guide intensity level when exercising independently   Knowledge and understanding of Target Heart Rate Range (THRR) Yes Yes Yes Yes Yes   Intervention Provide education and explanation of THRR including how the numbers were predicted and where they are located for reference Provide education and explanation of THRR including how the numbers were predicted and where they are located for reference Provide education and explanation of THRR including how the numbers were predicted and where they are located for reference Provide  education and explanation of THRR including how the numbers were predicted and where they are located for reference Provide education and explanation of THRR including how the numbers were predicted and where they are located for reference   Expected Outcomes Short Term: Able to state/look up THRR;Long Term: Able to use THRR to govern intensity when exercising independently;Short Term: Able to use daily as guideline for intensity in rehab Short Term: Able to state/look up THRR;Long Term: Able to use THRR to govern intensity when exercising independently;Short Term: Able to use daily as guideline for intensity in rehab Short Term: Able to state/look up  THRR;Long Term: Able to use THRR to govern intensity when exercising independently;Short Term: Able to use daily as guideline for intensity in rehab Short Term: Able to state/look up THRR;Long Term: Able to use THRR to govern intensity when exercising independently;Short Term: Able to use daily as guideline for intensity in rehab Short Term: Able to state/look up THRR;Long Term: Able to use THRR to govern intensity when exercising independently;Short Term: Able to use daily as guideline for intensity in rehab   Understanding of Exercise Prescription Yes Yes Yes Yes Yes   Intervention Provide education, explanation, and written materials on patient's individual exercise prescription Provide education, explanation, and written materials on patient's individual exercise prescription Provide education, explanation, and written materials on patient's individual exercise prescription Provide education, explanation, and written materials on patient's individual exercise prescription Provide education, explanation, and written materials on patient's individual exercise prescription   Expected Outcomes Short Term: Able to explain program exercise prescription;Long Term: Able to explain home exercise prescription to exercise independently Short Term: Able to explain program exercise prescription;Long Term: Able to explain home exercise prescription to exercise independently Short Term: Able to explain program exercise prescription;Long Term: Able to explain home exercise prescription to exercise independently Short Term: Able to explain program exercise prescription;Long Term: Able to explain home exercise prescription to exercise independently Short Term: Able to explain program exercise prescription;Long Term: Able to explain home exercise prescription to exercise independently            Exercise Goals Re-Evaluation :  Exercise Goals Re-Evaluation     Row Name 04/28/23 1052 05/19/23 1039 06/19/23 1228  07/18/23 1557 07/27/23 1552     Exercise Goal Re-Evaluation   Exercise Goals Review Increase Physical Activity;Able to understand and use Dyspnea scale;Understanding of Exercise Prescription;Increase Strength and Stamina;Knowledge and understanding of Target Heart Rate Range (THRR);Able to understand and use rate of perceived exertion (RPE) scale Increase Physical Activity;Able to understand and use Dyspnea scale;Understanding of Exercise Prescription;Increase Strength and Stamina;Knowledge and understanding of Target Heart Rate Range (THRR);Able to understand and use rate of perceived exertion (RPE) scale Increase Physical Activity;Able to understand and use Dyspnea scale;Understanding of Exercise Prescription;Increase Strength and Stamina;Knowledge and understanding of Target Heart Rate Range (THRR);Able to understand and use rate of perceived exertion (RPE) scale Increase Physical Activity;Able to understand and use Dyspnea scale;Understanding of Exercise Prescription;Increase Strength and Stamina;Knowledge and understanding of Target Heart Rate Range (THRR);Able to understand and use rate of perceived exertion (RPE) scale Increase Physical Activity;Able to understand and use Dyspnea scale;Understanding of Exercise Prescription;Increase Strength and Stamina;Knowledge and understanding of Target Heart Rate Range (THRR);Able to understand and use rate of perceived exertion (RPE) scale   Comments Skip is scheduled to begin exercise next week. Will continue to monitor and progress as able. Skip has completed 5 exercise sessions. He exercises for 15 min on the Nustep and track. Pt averages  2.7 METs at level 3 on the Nustep and 2.23 METs on the track. Skip performs the warmup and cooldown standing/ seated dependent on his shortness of breath. He has increased his workload for the Nustep as METs have slightly increased. It is too soon to notate any major progressions. Will continue to monitor and progress as able.  Skip has completed 13 exercise sessions. He exercises for 15 min on the Nustep and track. Pt averages 3.4 METs at level 3 on the Nustep and 2.08 METs on the track. Skip performs the warmup and cooldown standing/ seated dependent on his shortness of breath. His METs have increased for the Nustep and have remained the same for the track. Skip has most likely reached his functional capacity. Skip sits less during the warmup and cooldown. He feels that he have improved since starting PR. Skip has completed 19 exercise sessions. He exercises for 15 min on the Nustep and track. Pt averages 2.7 METs at level 3 on the Nustep and 1.77 METs on the track. Skip performs the warmup and cooldown standing/ seated dependent on his shortness of breath. Skip's METs have remained relatively the same because he has had other health issues and had to miss class. He is motivated to exercise as he tried to exercise at his best ability today. We have discussed home exercise. Skip is trying exercise at home as often as he can. Will continue to monitor and progress as able. Skip completed 19 exercise sessions. Peak METs were 3.4 on the Nustep and 1.77 on the track. He is being discharged due to hospitalization.   Expected Outcomes Through exercise at rehab and home, the patient will decrease shortness of breath with daily activities and feel confident in carrying out an exercise regimen at home. Through exercise at rehab and home, the patient will decrease shortness of breath with daily activities and feel confident in carrying out an exercise regimen at home. Through exercise at rehab and home, the patient will decrease shortness of breath with daily activities and feel confident in carrying out an exercise regimen at home. Through exercise at rehab and home, the patient will decrease shortness of breath with daily activities and feel confident in carrying out an exercise regimen at home. Through exercise at rehab and home, the patient  will decrease shortness of breath with daily activities and feel confident in carrying out an exercise regimen at home.    Row Name 09/18/23 0813 10/05/23 1620           Exercise Goal Re-Evaluation   Exercise Goals Review Increase Physical Activity;Able to understand and use Dyspnea scale;Understanding of Exercise Prescription;Increase Strength and Stamina;Knowledge and understanding of Target Heart Rate Range (THRR);Able to understand and use rate of perceived exertion (RPE) scale Increase Physical Activity;Able to understand and use Dyspnea scale;Understanding of Exercise Prescription;Increase Strength and Stamina;Knowledge and understanding of Target Heart Rate Range (THRR);Able to understand and use rate of perceived exertion (RPE) scale      Comments Skip is scheduled begin exercise again on 12/3. Skip has completed 3 exercise sessions. He exercises for 15 min on the recumbent bike and track. Skip averages 2.4 METs at level 2 on the recumbent bike and 1.92 METs on the track. He performs the warmup and cooldown mostly standing dependent on his shortness of breath. It is too soon to notate any discernable differences. Will continue to monitor and progress as able.      Expected Outcomes Through exercise at rehab and home, the patient  will decrease shortness of breath with daily activities and feel confident in carrying out an exercise regimen at home. Through exercise at rehab and home, the patient will decrease shortness of breath with daily activities and feel confident in carrying out an exercise regimen at home.               Discharge Exercise Prescription (Final Exercise Prescription Changes):  Exercise Prescription Changes - 10/03/23 0900       Response to Exercise   Blood Pressure (Admit) 112/68    Blood Pressure (Exit) 112/64    Heart Rate (Admit) 118 bpm    Heart Rate (Exercise) 131 bpm    Heart Rate (Exit) 117 bpm    Oxygen Saturation (Admit) 100 %    Oxygen Saturation  (Exercise) 96 %    Oxygen Saturation (Exit) 98 %    Rating of Perceived Exertion (Exercise) 13    Perceived Dyspnea (Exercise) 3    Duration Continue with 30 min of aerobic exercise without signs/symptoms of physical distress.    Intensity THRR New   62-150     Progression   Progression Continue to progress workloads to maintain intensity without signs/symptoms of physical distress.      Resistance Training   Training Prescription Yes    Weight red bands    Reps 10-15    Time 10 Minutes      Recumbant Bike   Level 2    Minutes 15    METs 2.3      Track   Laps 6.5    Minutes 15    METs 2             Nutrition:  Target Goals: Understanding of nutrition guidelines, daily intake of sodium 1500mg , cholesterol 200mg , calories 30% from fat and 7% or less from saturated fats, daily to have 5 or more servings of fruits and vegetables.  Biometrics:  Pre Biometrics - 09/18/23 0805       Pre Biometrics   Grip Strength 22 kg              Nutrition Therapy Plan and Nutrition Goals:  Nutrition Therapy & Goals - 07/25/23 1140       Nutrition Therapy   Diet Heart Healthy Diet    Drug/Food Interactions Statins/Certain Fruits      Personal Nutrition Goals   Nutrition Goal Patient to identify strategies for weight maintenance/ weight gain of 0.5-2.0# per week.   goal in progress.   Comments Goals in progress. Skip continues regular follow-up for kidney transplant; he has required dialysis since heart transplant in 2021. He currently receives hemodialysis M/W/F. His weight has been stable since hospitalization related to pneumonia in May 2024, and per diaylsis notes, his typical weight has been ~60kg. He is down about 3.5# since orientation to our program (start weight was 61.1kg). He lives with his sister who is a great support and does all of the grocery shopping and cooking. Skip reports drinking 1 Boost Breeze daily (250kcals, 9g protein) and 1 [employer] Milkshake  (580kcals, 13g protein) daily to aid with weight gain/weight maintenance. We have discussed multiple strategies for weight gain including increasing eating frequency, protein supplements, increasing calories from fat/protein/carbohydrates, etc. He is currently hospitalized for acute respiratory failure; will continue to monitor goals upon patient's return. Patient will benefit from participation in pulmonary rehab for nutrition, exercise, and lifestyle modification.      Intervention Plan   Intervention Prescribe, educate and counsel regarding individualized specific dietary  modifications aiming towards targeted core components such as weight, hypertension, lipid management, diabetes, heart failure and other comorbidities.;Nutrition handout(s) given to patient.    Expected Outcomes Long Term Goal: Adherence to prescribed nutrition plan.;Short Term Goal: Understand basic principles of dietary content, such as calories, fat, sodium, cholesterol and nutrients.             Nutrition Assessments:  Nutrition Assessments - 05/02/23 1113       Rate Your Plate Scores   Pre Score 62            MEDIFICTS Score Key: >=70 Need to make dietary changes  40-70 Heart Healthy Diet <= 40 Therapeutic Level Cholesterol Diet  Flowsheet Row PULMONARY REHAB OTHER RESPIRATORY from 05/02/2023 in [facility]  Picture Your Plate Total Score on Admission 62      Picture Your Plate Scores: <16 Unhealthy dietary pattern with much room for improvement. 41-50 Dietary pattern unlikely to meet recommendations for good health and room for improvement. 51-60 More healthful dietary pattern, with some room for improvement.  >60 Healthy dietary pattern, although there may be some specific behaviors that could be improved.    Nutrition Goals Re-Evaluation:  Nutrition Goals Re-Evaluation     Row Name 05/02/23 1010 05/30/23 1106 06/29/23 0934 07/25/23 1140        Goals   Current Weight 134 lb 11.2 oz (61.1 kg) 132 lb 0.9 oz (59.9 kg) 133 lb 9.6 oz (60.6 kg) 131 lb 2.8 oz (59.5 kg)    Comment GFR 11, Cr 5.5, PTH 194, triglycerides 161, HDL 34, Albumin 2.3 he continues regular follow-up with [facility] s/p heart transplant. Other most recent labs  GFR 11, Cr 5.5, PTH 194, triglycerides 161, HDL 34, Albumin 2.3 he continues regular follow-up with [facility] s/p heart transplant. Other most recent labs GFR 11, Cr 5.5, PTH 194, triglycerides 161, HDL 34, Albumin 2.3 Cr 4.99, Phos 5.6, [id number]; other most recent labs monitored by [facility] transplant team, triglycerides 161, HDL 34,    Expected Outcome Skip continues regular follow-up for kidney transplant; he has required dialysis since heart transplant in 2021. He currently receives hemodialysis M/W/F. His weight has been stable since hospitalization related to pneumonia in May 2024, and per diaylsis notes, his typical weight has been ~60kg. He lives with his sister who is a great support and does all of the grocery shopping and cooking. Skip reports drinking 1 Boost Breeze daily (250kcals, 9g protein) and 1 [employer]fil[employer] Milkshake (580kcals, 13g protein) daily to aid with weight gain/weight maintenance. Patient will benefit from participation in pulmonary rehab for nutrition, exercise, and lifestyle modification. Goals in progress. Skip continues regular follow-up for kidney transplant; he has required dialysis since heart transplant in 2021. He currently receives hemodialysis M/W/F. His weight has been stable since hospitalization related to pneumonia in May 2024, and per diaylsis notes, his typical weight has been ~60kg. He is down about 4.6# since orientation to our program (start weight was 61.1kg). He lives with his sister who is a great support and does all of the grocery shopping and cooking. Skip reports drinking 1 Boost Breeze daily (250kcals, 9g protein) and 1 Chick-fil[employer] Milkshake (580kcals, 13g protein) daily  to aid with weight gain/weight maintenance. We have discussed multiple strategies for weight gain including increasing eating frequency, protein supplements, increasing calories from fat/protein/carbohydrates, etc. Patient will benefit from participation in pulmonary rehab for nutrition, exercise, and lifestyle modification. Goals in progress. Skip  continues regular follow-up for kidney transplant; he has required dialysis since heart transplant in 2021. He currently receives hemodialysis M/W/F. His weight has been stable since hospitalization related to pneumonia in May 2024, and per diaylsis notes, his typical weight has been ~60kg. He is down about 2.2# since orientation to our program (start weight was 61.1kg). He lives with his sister who is a great support and does all of the grocery shopping and cooking. Skip reports drinking 1 Boost Breeze daily (250kcals, 9g protein) and 1 [employer]fil[employer] Milkshake (580kcals, 13g protein) daily to aid with weight gain/weight maintenance. We have discussed multiple strategies for weight gain including increasing eating frequency, protein supplements, increasing calories from fat/protein/carbohydrates, etc. Patient will benefit from participation in pulmonary rehab for nutrition, exercise, and lifestyle modification. Goals in progress. Skip continues regular follow-up for kidney transplant; he has required dialysis since heart transplant in 2021. He currently receives hemodialysis M/W/F. His weight has been stable since hospitalization related to pneumonia in May 2024, and per diaylsis notes, his typical weight has been ~60kg. He is down about 3.5# since orientation to our program (start weight was 61.1kg). He lives with his sister who is a great support and does all of the grocery shopping and cooking. Skip reports drinking 1 Boost Breeze daily (250kcals, 9g protein) and 1 Chick[employer]fil[employer] Milkshake (580kcals, 13g protein) daily to aid with weight gain/weight maintenance. We have  discussed multiple strategies for weight gain including increasing eating frequency, protein supplements, increasing calories from fat/protein/carbohydrates, etc. He is currently hospitalized for acute respiratory failure; will continue to monitor goals upon patient's return. Patient will benefit from participation in pulmonary rehab for nutrition, exercise, and lifestyle modification.             Nutrition Goals Discharge (Final Nutrition Goals Re-Evaluation):  Nutrition Goals Re-Evaluation - 07/25/23 1140       Goals   Current Weight 131 lb 2.8 oz (59.5 kg)    Comment Cr 4.99, Phos 5.6, [id number]; other most recent labs monitored by [facility] transplant team, triglycerides 161, HDL 34,    Expected Outcome Goals in progress. Skip continues regular follow-up for kidney transplant; he has required dialysis since heart transplant in 2021. He currently receives hemodialysis M/W/F. His weight has been stable since hospitalization related to pneumonia in May 2024, and per diaylsis notes, his typical weight has been ~60kg. He is down about 3.5# since orientation to our program (start weight was 61.1kg). He lives with his sister who is a great support and does all of the grocery shopping and cooking. Skip reports drinking 1 Boost Breeze daily (250kcals, 9g protein) and 1 [employer]fil[employer] Milkshake (580kcals, 13g protein) daily to aid with weight gain/weight maintenance. We have discussed multiple strategies for weight gain including increasing eating frequency, protein supplements, increasing calories from fat/protein/carbohydrates, etc. He is currently hospitalized for acute respiratory failure; will continue to monitor goals upon patient's return. Patient will benefit from participation in pulmonary rehab for nutrition, exercise, and lifestyle modification.             Psychosocial: Target Goals: Acknowledge presence or absence of significant depression and/or stress, maximize coping skills, provide positive  support system. Participant is able to verbalize types and ability to use techniques and skills needed for reducing stress and depression.  Initial Review & Psychosocial Screening:  Initial Psych Review & Screening - 09/18/23 0810       Initial Review   Current issues with None Identified      Family Dynamics  Good Support System? Yes    Comments sister and other family members      Barriers   Psychosocial barriers to participate in program There are no identifiable barriers or psychosocial needs.      Screening Interventions   Interventions Encouraged to exercise             Quality of Life Scores:  Scores of 19 and below usually indicate a poorer quality of life in these areas.  A difference of  2-3 points is a clinically meaningful difference.  A difference of 2-3 points in the total score of the Quality of Life Index has been associated with significant improvement in overall quality of life, self-image, physical symptoms, and general health in studies assessing change in quality of life.  PHQ-9: Review Flowsheet       09/18/2023 04/24/2023  Depression screen PHQ 2/9  Decreased Interest 0 0  Down, Depressed, Hopeless 0 0  PHQ - 2 Score 0 0  Altered sleeping 1 0  Tired, decreased energy 0 0  Change in appetite 0 0  Feeling bad or failure about yourself  0 0  Trouble concentrating 0 0  Moving slowly or fidgety/restless 0 0  Suicidal thoughts 0 0  PHQ-9 Score 1 0  Difficult doing work/chores Not difficult at all Not difficult at all   Interpretation of Total Score  Total Score Depression Severity:  1-4 = Minimal depression, 5-9 = Mild depression, 10-14 = Moderate depression, 15-19 = Moderately severe depression, 20-27 = Severe depression   Psychosocial Evaluation and Intervention:  Psychosocial Evaluation - 09/18/23 0810       Psychosocial Evaluation & Interventions   Interventions Encouraged to exercise with the program and follow exercise prescription     Comments Skip denies any psychosocial barriers to exercise.    Expected Outcomes For Skip to participate in PR free of psychosocial barriers.    Continue Psychosocial Services  No Follow up required             Psychosocial Re-Evaluation:  Psychosocial Re-Evaluation     Row Name 04/26/23 1550 05/24/23 1223 06/19/23 1210 07/19/23 0913 09/20/23 0803     Psychosocial Re-Evaluation   Current issues with None Identified None Identified None Identified None Identified None Identified   Comments No change in psychosocial assessment since orientation 04/24/2023. No concerns identified No new psychosocial barriers or concerns at this time. Skip is enjoying PR and denies any stressors. He states that his mental health is doing "fine". Skip states that his sister does a good job taking care of him and monitoring his overnight oxygen saturations. He is tolerating his HD sessions on T, Th, Sat well. He stated he is getting worked up for a kidney transplant. Skip stated he is looking forward to traveling and going to baseball games with his sister. Skip denies any needs at this time. Skip had to be discharged from the program on 07/26/23. His sister called [place] and stated he was hospitalized for bilateral pneumonia. He has been transferred from [facility] to [facility]. Skip completed 19 sessions of Pulm Rehab. Skip never complained while in class, worked hard, even when he didn't feel well. Skip denied any psychosocial issues or concerns at his last re-evaluation. He has great support from his sister. Skip is scheduled to start the program on 09/26/23. No new barriers or concerns since orientation.   Expected Outcomes For Skip to attend PR without any barriers or concerns For Skip to attend PR without  any barriers or concerns For Skip to attend PR without any barriers or concerns For Skip to continue to exercise for stress relief post hospitalization For Skip to participate in PR free of any psychosocial barriers  or concerns.   Interventions Encouraged to attend Pulmonary Rehabilitation for the exercise Encouraged to attend Pulmonary Rehabilitation for the exercise Encouraged to attend Pulmonary Rehabilitation for the exercise -- Encouraged to attend Pulmonary Rehabilitation for the exercise   Continue Psychosocial Services  No Follow up required No Follow up required No Follow up required No Follow up required No Follow up required    Row Name 10/06/23 1422             Psychosocial Re-Evaluation   Current issues with None Identified       Comments Skip continues to deny any psy/soc needs during his monthly psychosocial evaluation. He states that his health pretty much dictates his plans for the day. He attends dialysis 3 days a week, pulmonary rehab 2 days a week and filters in MD appointments in between. 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 He states he has great support from his sister who drives him to and from appointments as well as serves as his primary caretaker. He denies any needs at this time. Skip has positive coping skills with a positive outlook.       Expected Outcomes For Skip to continue in PR free of any psychosocial barriers or concerns, to use positive coping skills to manage stress.       Interventions Encouraged to attend Pulmonary Rehabilitation for the exercise       Continue Psychosocial Services  No Follow up required                Psychosocial Discharge (Final Psychosocial Re-Evaluation):  Psychosocial Re-Evaluation - 10/06/23 1422       Psychosocial Re-Evaluation   Current issues with None Identified    Comments Skip continues to deny any psy/soc needs during his monthly psychosocial evaluation. He states that his health pretty much dictates his plans for the day. He attends dialysis 3 days a week, pulmonary rehab 2 days a week and filters in MD appointments in between. Since his heart transplant he is also being worked up for  a kidney transplant. He states he has great support from his sister who drives him to and from appointments as well as serves as his primary caretaker. He denies any needs at this time. Skip has positive coping skills with a positive outlook.    Expected Outcomes For Skip to continue in PR free of any psychosocial barriers or concerns, to use positive coping skills to manage stress.    Interventions Encouraged to attend Pulmonary Rehabilitation for the exercise    Continue Psychosocial Services  No Follow up required             Education: Education Goals: Education classes will be provided on a weekly basis, covering required topics. Participant will state understanding/return demonstration of topics presented.  Learning Barriers/Preferences:  Learning Barriers/Preferences - 04/24/23 0924       Learning Barriers/Preferences   Learning Barriers Sight    Learning Preferences Skilled Demonstration             Education Topics: Know Your Numbers Group instruction that is supported by a PowerPoint presentation. Instructor discusses importance of knowing and understanding resting, exercise, and post-exercise oxygen saturation, heart rate, and blood pressure. Oxygen saturation, heart rate, blood  pressure, rating of perceived exertion, and dyspnea are reviewed along with a normal range for these values.  Flowsheet Row PULMONARY REHAB OTHER RESPIRATORY from 05/04/2023 in [facility] for Heart, Vascular, & Lung Health  Date 05/04/23  Educator EP  Instruction Review Code 1- Verbalizes Understanding       Exercise for the Pulmonary Patient Group instruction that is supported by a PowerPoint presentation. Instructor discusses benefits of exercise, core components of exercise, frequency, duration, and intensity of an exercise routine, importance of utilizing pulse oximetry during exercise, safety while exercising, and options of places to exercise outside of rehab.     MET Level  Group instruction provided by PowerPoint, verbal discussion, and written material to support subject matter. Instructor reviews what METs are and how to increase METs.  Flowsheet Row PULMONARY REHAB OTHER RESPIRATORY from 06/22/2023 in [facility] for Heart, Vascular, & Lung Health  Date 06/22/23  Educator EP  Instruction Review Code 1- Verbalizes Understanding       Pulmonary Medications Verbally interactive group education provided by instructor with focus on inhaled medications and proper administration. Flowsheet Row PULMONARY REHAB OTHER RESPIRATORY from 10/12/2023 in [facility] for Heart, Vascular, & Lung Health  Date 10/12/23       Anatomy and Physiology of the Respiratory System Group instruction provided by PowerPoint, verbal discussion, and written material to support subject matter. Instructor reviews respiratory cycle and anatomical components of the respiratory system and their functions. Instructor also reviews differences in obstructive and restrictive respiratory diseases with examples of each.  Flowsheet Row PULMONARY REHAB OTHER RESPIRATORY from 10/05/2023 in [facility] for Heart, Vascular, & Lung Health  Date 10/05/23  Educator RT  Instruction Review Code 1- Verbalizes Understanding       Oxygen Safety Group instruction provided by PowerPoint, verbal discussion, and written material to support subject matter. There is an overview of "What is Oxygen" and "Why do we need it".  Instructor also reviews how to create a safe environment for oxygen use, the importance of using oxygen as prescribed, and the risks of noncompliance. There is a brief discussion on traveling with oxygen and resources the patient may utilize. Flowsheet Row PULMONARY REHAB OTHER RESPIRATORY from 05/11/2023 in [facility] for Heart, Vascular, & Lung Health  Date 05/11/23  Educator RN   Instruction Review Code 1- Verbalizes Understanding       Oxygen Use Group instruction provided by PowerPoint, verbal discussion, and written material to discuss how supplemental oxygen is prescribed and different types of oxygen supply systems. Resources for more information are provided.    Breathing Techniques Group instruction that is supported by demonstration and informational handouts. Instructor discusses the benefits of pursed lip and diaphragmatic breathing and detailed demonstration on how to perform both.  Flowsheet Row PULMONARY REHAB OTHER RESPIRATORY from 05/25/2023 in [facility] for Heart, Vascular, & Lung Health  Date 05/25/23  Educator RN  Instruction Review Code 1- Verbalizes Understanding        Risk Factor Reduction Group instruction that is supported by a PowerPoint presentation. Instructor discusses the definition of a risk factor, different risk factors for pulmonary disease, and how the heart and lungs work together. Flowsheet Row PULMONARY REHAB OTHER RESPIRATORY from 06/15/2023 in [facility] for Heart, Vascular, & Lung Health  Date 06/15/23  Educator EP  Instruction Review Code 1- Verbalizes Understanding  Pulmonary Diseases Group instruction provided by PowerPoint, verbal discussion, and written material to support subject matter. Instructor gives an overview of the different type of pulmonary diseases. There is also a discussion on risk factors and symptoms as well as ways to manage the diseases. Flowsheet Row PULMONARY REHAB OTHER RESPIRATORY from 09/28/2023 in [facility] for Heart, Vascular, & Lung Health  Date 09/28/23  Educator RT  Instruction Review Code 1- Verbalizes Understanding       Stress and Energy Conservation Group instruction provided by PowerPoint, verbal discussion, and written material to support subject matter. Instructor gives an overview of stress and  the impact it can have on the body. Instructor also reviews ways to reduce stress. There is also a discussion on energy conservation and ways to conserve energy throughout the day.   Warning Signs and Symptoms Group instruction provided by PowerPoint, verbal discussion, and written material to support subject matter. Instructor reviews warning signs and symptoms of stroke, heart attack, cold and flu. Instructor also reviews ways to prevent the spread of infection.   Other Education Group or individual verbal, written, or video instructions that support the educational goals of the pulmonary rehab program. Flowsheet Row PULMONARY REHAB OTHER RESPIRATORY from 06/29/2023 in [facility] for Heart, Vascular, & Lung Health  Date 06/29/23  Educator RT  Instruction Review Code 1- Verbalizes Understanding        Knowledge Questionnaire Score:  Knowledge Questionnaire Score - 04/24/23 5188       Knowledge Questionnaire Score   Pre Score 18/18             Core Components/Risk Factors/Patient Goals at Admission:  Personal Goals and Risk Factors at Admission - 09/18/23 0913       Core Components/Risk Factors/Patient Goals on Admission    Weight Management --    Improve shortness of breath with ADL's Yes    Intervention Provide education, individualized exercise plan and daily activity instruction to help decrease symptoms of SOB with activities of daily living.    Expected Outcomes Short Term: Improve cardiorespiratory fitness to achieve a reduction of symptoms when performing ADLs;Long Term: Be able to perform more ADLs without symptoms or delay the onset of symptoms             Core Components/Risk Factors/Patient Goals Review:   Goals and Risk Factor Review     Row Name 04/26/23 1552 05/24/23 1226 06/19/23 1244 07/19/23 0917 09/20/23 0805     Core Components/Risk Factors/Patient Goals Review   Personal Goals Review Weight Management/Obesity;Develop more  efficient breathing techniques such as purse lipped breathing and diaphragmatic breathing and practicing self-pacing with activity.;Improve shortness of breath with ADL's Weight Management/Obesity;Develop more efficient breathing techniques such as purse lipped breathing and diaphragmatic breathing and practicing self-pacing with activity.;Improve shortness of breath with ADL's Weight Management/Obesity;Develop more efficient breathing techniques such as purse lipped breathing and diaphragmatic breathing and practicing self-pacing with activity.;Improve shortness of breath with ADL's Weight Management/Obesity;Improve shortness of breath with ADL's Improve shortness of breath with ADL's;Develop more efficient breathing techniques such as purse lipped breathing and diaphragmatic breathing and practicing self-pacing with activity.   Review Unable to evaluate any progress towards goals yet. Skip is scheduled to begin on 7/9. Goal progressing for weight gain. Skip is working with staff dietician to obtain weight gain goal. Goal progressing for improve shortness of breath with ADL's. Goal progressing on developing more efficient breathing techniques such as purse lipped breathing and diaphragmatic  breathing. Goal not progressing for weight gain. Skip has not gained any weight since starting the program. Skip and his sister are working with our staff dietician to gain weight. He is eating a renal diet. Goal progressing for improving shortness of breath with ADL's. We have weaned Skip off his oxygen since starting the program. He still uses oxygen at night. O2 saturations have been > 88% while exercising. Goal met on developing more efficient breathing techniques such as purse lipped breathing and diaphragmatic breathing. Skip can initiate PLB on when he is short of breath while exercising. He states he has been practicing diaphragmatic breathing at home. Skip is enjoying the class and stated he can see the progress. Skip  had to be discharged from the program on 07/26/23 after completing 19 sessions. Skip is currently hospitalized for bilateral pneumonia, and his plan is to go to skilled nursing after discharge. Core components/risk factors/patient goals are as follows: Goal not met for weight gain. Skip is down ~2.5# since orientation. Our dietician has spoken to both Skip and his sister for weight gain advice. Due to his recent illness, Skip had decreased appetite. Goal not met for improving shortness of breath with ADL's. We had weaned Skip off his oxygen during class, but due to his recent pleural effusion, thoracentesis, and bilateral pneumonia he had to be placed back on oxygen. Skip is scheduled to start PR on 09/26/23. Goal progressing for improving shortness of breath with ADL's. Goal progressing for developing more efficient breathing techniques such as purse lipped breathing and diaphragmatic breathing; and practicing self-pacing with activity.   Expected Outcomes For Skip to gain weight, improve his shortness of breath with ADLs, and develop more efficient breathing techniques. See admission goals For Skip to gain weight, improve his shortness of breath with his ADLs & develop more efficient breathing techniques For Skip to gain weight and improve SOB with his ADL's post pulmonary rehab For Skip to improve shortness of breath with ADL's and develop more efficient breathing techniques such as purse lipped breathing and diaphragmatic breathing; and practicing self-pacing with activity.    Row Name 10/06/23 1422             Core Components/Risk Factors/Patient Goals Review   Personal Goals Review Improve shortness of breath with ADL's;Develop more efficient breathing techniques such as purse lipped breathing and diaphragmatic breathing and practicing self-pacing with activity.       Review Monthly review of patient's Core Components/Risk Factors/Patient Goals are as follows: Goal progressing on improving Skip's  shortness of breath with ADLs. Skip has been able to wean off oxygen with exertion. He currently only wears oxygen at night. Skip stated he is able to do "a little" more for himself than previously. He can walk farther and has more stamina. Goal met for developing more efficient breathing techniques such as purse lipped breathing and diaphragmatic breathing; and practicing self-pacing activities. Skip can initiate purse lipped breathing on his own while exercising. He can correctly demonstrate diaphragmatic breathing to staff. He knows how to self-pace himself before he becomes breathless with exertion and/or exercising. Skip will continue to benefit from participation in PR for nutrition, education, exercise, and lifestyle modification.       Expected Outcomes For Skip to improve shortness of breath with ADL's                Core Components/Risk Factors/Patient Goals at Discharge (Final Review):   Goals and Risk Factor Review - 10/06/23 1422  Core Components/Risk Factors/Patient Goals Review   Personal Goals Review Improve shortness of breath with ADL's;Develop more efficient breathing techniques such as purse lipped breathing and diaphragmatic breathing and practicing self-pacing with activity.    Review Monthly review of patient's Core Components/Risk Factors/Patient Goals are as follows: Goal progressing on improving Skip's shortness of breath with ADLs. Skip has been able to wean off oxygen with exertion. He currently only wears oxygen at night. Skip stated he is able to do "a little" more for himself than previously. He can walk farther and has more stamina. Goal met for developing more efficient breathing techniques such as purse lipped breathing and diaphragmatic breathing; and practicing self-pacing activities. Skip can initiate purse lipped breathing on his own while exercising. He can correctly demonstrate diaphragmatic breathing to staff. He knows how to self-pace himself before he  becomes breathless with exertion and/or exercising. Skip will continue to benefit from participation in PR for nutrition, education, exercise, and lifestyle modification.    Expected Outcomes For Skip to improve shortness of breath with ADL's             ITP Comments:Pt is making expected progress toward Pulmonary Rehab goals after completing 5 session(s). Recommend continued exercise, life style modification, education, and utilization of breathing techniques to increase stamina and strength, while also decreasing shortness of breath with exertion.  Dr. Mechele Collin is Medical Director for Pulmonary Rehab at [facility].

## 2023-10-19 ENCOUNTER — Encounter (HOSPITAL_COMMUNITY)
Admission: RE | Admit: 2023-10-19 | Discharge: 2023-10-19 | Disposition: A | Discharge status: Home / Self Care | Payer: Medicare Other | Source: Ambulatory Visit | Attending: Pulmonary Disease | Admitting: Pulmonary Disease

## 2023-10-19 DIAGNOSIS — J849 Interstitial pulmonary disease, unspecified: Secondary | ICD-10-CM | POA: Diagnosis not present

## 2023-10-19 NOTE — Progress Notes (Signed)
Daily Session Note  Patient Details  Name: Rickey Erickson MRN: [id number] Date of Birth: Jan 11, 1956 Referring Provider:   [name] Pulmonary Rehab Walk Test from 09/18/2023 in [facility] for Heart, Vascular, & Lung Health  Referring Provider [name]       Encounter Date: 10/19/2023  Check In:  Session Check In - 10/19/23 6010       Check-In   Supervising physician immediately available to respond to emergencies [facility] MD immediately available    Physician(s) Reather Littler, NP    Location [facility]    Staff Present Raford Pitcher, MS, ACSM-CEP, Exercise Physiologist;Casey Synthia Innocent, RN, BSN    Virtual Visit No    Medication changes reported     No    Fall or balance concerns reported    No    Tobacco Cessation No Change    Warm-up and Cool-down Performed as group-led instruction    Resistance Training Performed Yes    VAD Patient? No    PAD/SET Patient? No      Pain Assessment   Currently in Pain? No/denies    Multiple Pain Sites No             Capillary Blood Glucose: No results found for this or any previous visit (from the past 24 hours).    Social History   Tobacco Use  Smoking Status Never  Smokeless Tobacco Never    Goals Met:  Independence with exercise equipment Exercise tolerated well No report of concerns or symptoms today Strength training completed today  Goals Unmet:  Not Applicable  Comments: Service time is from 0810 to 0927    Dr. Mechele Collin is Medical Director for Pulmonary Rehab at [facility].

## 2023-10-24 ENCOUNTER — Encounter (HOSPITAL_COMMUNITY): Admission: RE | Admit: 2023-10-24 | Payer: Medicare Other | Source: Ambulatory Visit

## 2023-10-26 ENCOUNTER — Encounter (HOSPITAL_COMMUNITY)
Admission: RE | Admit: 2023-10-26 | Discharge: 2023-10-26 | Disposition: A | Discharge status: Home / Self Care | Payer: Medicare Other | Source: Ambulatory Visit | Attending: Pulmonary Disease | Admitting: Pulmonary Disease

## 2023-10-26 VITALS — Wt 123.2 lb

## 2023-10-26 DIAGNOSIS — J849 Interstitial pulmonary disease, unspecified: Secondary | ICD-10-CM | POA: Insufficient documentation

## 2023-10-26 NOTE — Progress Notes (Signed)
 Daily Session Note  Patient Details  Name: Rickey Erickson MRN: [id number] Date of Birth: 1956/08/08 Referring Provider:   Conrad Ports Pulmonary Rehab Walk Test from 09/18/2023 in [facility] for Heart, Vascular, & Lung Health  Referring Provider [name]       Encounter Date: 10/26/2023  Check In:  Session Check In - 10/26/23 9170       Check-In   Supervising physician immediately available to respond to emergencies [facility] MD immediately available    Physician(s) Rosaline Skains, NP    Location [facility]    Staff Present Johnnie Moats, MS, ACSM-CEP, Exercise Physiologist;Keysean Savino Claudene Candia Levin, RN, BSN    Virtual Visit No    Medication changes reported     No    Fall or balance concerns reported    No    Tobacco Cessation No Change    Warm-up and Cool-down Performed as group-led instruction    Resistance Training Performed Yes    VAD Patient? No    PAD/SET Patient? No      Pain Assessment   Currently in Pain? No/denies    Multiple Pain Sites No             Capillary Blood Glucose: No results found for this or any previous visit (from the past 24 hours).    Social History   Tobacco Use  Smoking Status Never  Smokeless Tobacco Never    Goals Met:  Proper associated with RPD/PD & O2 Sat Independence with exercise equipment Exercise tolerated well No report of concerns or symptoms today Strength training completed today  Goals Unmet:  Not Applicable  Comments: Service time is from 0804 to 0905.    Dr. Slater Staff is Medical Director for Pulmonary Rehab at [facility].

## 2023-10-29 MED ORDER — ELIQUIS 2.5 MG TABLET
ORAL_TABLET | Freq: Two times a day (BID) | ORAL | 0 refills | 0.00 days
Start: 2023-10-29 — End: ?

## 2023-10-30 ENCOUNTER — Ambulatory Visit (HOSPITAL_BASED_OUTPATIENT_CLINIC_OR_DEPARTMENT_OTHER): Payer: Medicare Other | Attending: Pulmonary Disease | Admitting: Internal Medicine

## 2023-10-30 DIAGNOSIS — G4733 Obstructive sleep apnea (adult) (pediatric): Secondary | ICD-10-CM | POA: Diagnosis not present

## 2023-10-30 DIAGNOSIS — I493 Ventricular premature depolarization: Secondary | ICD-10-CM | POA: Insufficient documentation

## 2023-10-30 DIAGNOSIS — J9612 Chronic respiratory failure with hypercapnia: Secondary | ICD-10-CM | POA: Diagnosis present

## 2023-10-30 DIAGNOSIS — Z941 Heart transplant status: Principal | ICD-10-CM

## 2023-10-30 DIAGNOSIS — Z79899 Other long term (current) drug therapy: Principal | ICD-10-CM

## 2023-10-30 MED ORDER — ELIQUIS 2.5 MG TABLET
ORAL_TABLET | Freq: Two times a day (BID) | ORAL | 0 refills | 90.00 days | Status: CP
Start: 2023-10-30 — End: ?

## 2023-10-31 ENCOUNTER — Encounter (HOSPITAL_COMMUNITY): Payer: Medicare Other

## 2023-10-31 ENCOUNTER — Telehealth (HOSPITAL_COMMUNITY): Payer: Self-pay | Admitting: *Deleted

## 2023-10-31 NOTE — Telephone Encounter (Signed)
 Pt's sister called to state that pt is unable to attend Pulmonary Rehab today due to having a sleep study last night and is too fatigued.  Ethelda Chick BS, ACSM-CEP 10/31/2023 7:47 AM

## 2023-11-02 ENCOUNTER — Encounter (HOSPITAL_COMMUNITY)
Admission: RE | Admit: 2023-11-02 | Discharge: 2023-11-02 | Disposition: A | Discharge status: Home / Self Care | Payer: Medicare Other | Source: Ambulatory Visit | Attending: Pulmonary Disease | Admitting: Pulmonary Disease

## 2023-11-02 DIAGNOSIS — J849 Interstitial pulmonary disease, unspecified: Secondary | ICD-10-CM | POA: Diagnosis not present

## 2023-11-02 NOTE — Progress Notes (Signed)
 Daily Session Note  Patient Details  Name: Rickey Erickson MRN: [id number] Date of Birth: 1956-10-09 Referring Provider:   Conrad Ports Pulmonary Rehab Walk Test from 09/18/2023 in [facility], V[facility], & [facility]  Referring Provider [name]       Encounter Date: 11/02/2023  Check In:  Session Check In - 11/02/23 0829       Check-In   Supervising physician immediately available to respond to emergencies [facility] MD immediately available    Physician(s) barnie Press, NP    Location [facility]    Staff Present Johnnie Moats, MS, ACSM-CEP, Exercise Physiologist;Jerimie Mancuso Claudene Candia Levin, RN, BSN;Randi Reeve BS, ACSM-CEP, Exercise Physiologist    Virtual Visit No    Medication changes reported     No    Fall or balance concerns reported    No    Tobacco Cessation No Change    Warm-up and Cool-down Performed as group-led instruction    Resistance Training Performed Yes    VAD Patient? No    PAD/SET Patient? No      Pain Assessment   Currently in Pain? No/denies    Multiple Pain Sites No             Capillary Blood Glucose: No results found for this or any previous visit (from the past 24 hours).    Social History   Tobacco Use  Smoking Status Never  Smokeless Tobacco Never    Goals Met:  Proper associated with RPD/PD & O2 Sat Independence with exercise equipment Exercise tolerated well No report of concerns or symptoms today Strength training completed today  Goals Unmet:  Not Applicable  Comments: Service time is from 0824 to 0931.    Dr. Slater Staff is Medical Director for Pulmonary Rehab at [facility].

## 2023-11-04 NOTE — Procedures (Signed)
 Patient Name: Rickey Erickson, Rickey Erickson Date: 10/30/2023 Gender: Male D.O.B: 12-12-1955 Age (years): 27 Referring Provider: Donnice Beals MD Height (inches): 68 Interpreting Physician: Reggy Salt MD, ABSM Weight (lbs): 121 RPSGT: Andria Blossom BMI: 18 MRN: [id number] Neck Size: 14.50  CLINICAL INFORMATION Sleep Study Type: Split Night CPAP Indication for sleep study: Fatigue, Hypertension, OSA Epworth Sleepiness Score: 2  SLEEP STUDY TECHNIQUE As per the AASM Manual for the Scoring of Sleep and Associated Events v2.3 (April 2016) with a hypopnea requiring 4% desaturations.  The channels recorded and monitored were frontal, central and occipital EEG, electrooculogram (EOG), submentalis EMG (chin), nasal and oral airflow, thoracic and abdominal wall motion, anterior tibialis EMG, snore microphone, electrocardiogram, and pulse oximetry. Continuous positive airway pressure (CPAP) was initiated when the patient met split night criteria and was titrated according to treat sleep-disordered breathing.  MEDICATIONS Medications self-administered by patient taken the night of the study : none reported  RESPIRATORY PARAMETERS Diagnostic  Total AHI (/hr): 26.5 RDI (/hr): 68.1 OA Index (/hr): - CA Index (/hr): 0.0 REM AHI (/hr): N/A NREM AHI (/hr): 26.5 Supine AHI (/hr): 26.5 Non-supine AHI (/hr): 0 Min O2 Sat (%): 80.0 Mean O2 (%): 90.9 Time below 88% (min): 9   Titration  Optimal Pressure (cm): 9 AHI at Optimal Pressure (/hr): 14.3 Min O2 at Optimal Pressure (%): 84.0 Supine % at Optimal (%): 95 Sleep % at Optimal (%): 12   SLEEP ARCHITECTURE The recording time for the entire night was 405 minutes.  During a baseline period of 162.4 minutes, the patient slept for 142.7 minutes in REM and nonREM, yielding a sleep efficiency of 87.8%. Sleep onset after lights out was 14.7 minutes with a REM latency of N/A minutes. The patient spent 9.1% of the night in stage N1 sleep, 90.9% in  stage N2 sleep, 0.0% in stage N3 and 0% in REM.  During the titration period of 240.2 minutes, the patient slept for 36.0 minutes in REM and nonREM, yielding a sleep efficiency of 15.0%. Sleep onset after CPAP initiation was 46.4 minutes with a REM latency of N/A minutes. The patient spent 68.1% of the night in stage N1 sleep, 31.9% in stage N2 sleep, 0.0% in stage N3 and 0% in REM.  CARDIAC DATA The 2 lead EKG demonstrated sinus rhythm. The mean heart rate was 100.0 beats per minute. Other EKG findings include: PVCs.  LEG MOVEMENT DATA The total Periodic Limb Movements of Sleep (PLMS) were 0. The PLMS index was 0.0 .  IMPRESSIONS - Moderate obstructive sleep apnea occurred during the diagnostic portion of the study(AHI = 26.5/hour). An optimal PAP pressure was selected for this patient ( 9 cm of water ) - Moderate oxygen desaturation was noted during the diagnostic portion of the study (Min O2 =80.0%). On CPAP 9, minimum O2 saturation 84%, Mean 91.8%. Supplemental O2 was not provided. - Effective titration was significantly limited because of patient's difficulty maintaining sleep while on CPAP due to complaint of back pain. - The patient snored with soft snoring volume during the diagnostic portion of the study. - EKG findings include PVCs. - Clinically significant periodic limb movements did not occur during sleep.  DIAGNOSIS - Obstructive Sleep Apnea (G47.33)  RECOMMENDATIONS - Trial of CPAP therapy on 9 cm H2O or autopap 5-15. - Patient wore a Large size Resmed Full Face AirTouch mask and heated humidification. - Management of back pain may be important for effective CPAP compliance. - Sleep hygiene should be reviewed to assess factors that may  improve sleep quality. - Weight management and regular exercise should be initiated or continued.  [Electronically signed] 11/04/2023 01:10 PM  Reggy Salt MD, ABSM Diplomate, American Board of Sleep Medicine NPI: [id number]                          Reggy Salt Diplomate, American Board of Sleep Medicine  ELECTRONICALLY SIGNED ON:  11/04/2023, 1:04 PM [facility] PH: [phone]   FX: [phone] ACCREDITED BY THE AMERICAN ACADEMY OF SLEEP MEDICINE

## 2023-11-07 ENCOUNTER — Encounter (HOSPITAL_COMMUNITY)
Admission: RE | Admit: 2023-11-07 | Discharge: 2023-11-07 | Disposition: A | Discharge status: Home / Self Care | Payer: Medicare Other | Source: Ambulatory Visit | Attending: Pulmonary Disease | Admitting: Pulmonary Disease

## 2023-11-07 DIAGNOSIS — J849 Interstitial pulmonary disease, unspecified: Secondary | ICD-10-CM

## 2023-11-07 NOTE — Progress Notes (Signed)
 Daily Session Note  Patient Details  Name: Rickey Erickson MRN: [id number] Date of Birth: 05-Aug-1956 Referring Provider:   Conrad Erickson Pulmonary Rehab Walk Test from 09/18/2023 in [facility], Vascular, & [facility]  Referring Provider Rickey Erickson       Encounter Date: 11/07/2023  Check In:  Session Check In - 11/07/23 0839       Check-In   Supervising physician immediately available to respond to emergencies [facility] MD immediately available    Physician(s) Rickey Satterfield, NP    Location [facility]    Staff Present Rickey Moats, MS, ACSM-CEP, Exercise Physiologist;Rickey Erickson BS, ACSM-CEP, Exercise Physiologist    Virtual Visit No    Medication changes reported     No    Fall or balance concerns reported    No    Tobacco Cessation No Change    Warm-up and Cool-down Performed as group-led instruction    Resistance Training Performed Yes    VAD Patient? No    PAD/SET Patient? No      Pain Assessment   Currently in Pain? No/denies    Multiple Pain Sites No             Capillary Blood Glucose: No results found for this or any previous visit (from the past 24 hours).    Social History   Tobacco Use  Smoking Status Never  Smokeless Tobacco Never    Goals Met:  Independence with exercise equipment Exercise tolerated well No report of concerns or symptoms today Strength training completed today  Goals Unmet:  Not Applicable  Comments: Service time is from 0801 to [phone].    Dr. Slater Staff is Medical Director for Pulmonary Rehab at [facility].

## 2023-11-09 ENCOUNTER — Encounter (HOSPITAL_COMMUNITY)
Admission: RE | Admit: 2023-11-09 | Discharge: 2023-11-09 | Disposition: A | Discharge status: Home / Self Care | Payer: Medicare Other | Source: Ambulatory Visit | Attending: Pulmonary Disease | Admitting: Pulmonary Disease

## 2023-11-09 DIAGNOSIS — J849 Interstitial pulmonary disease, unspecified: Secondary | ICD-10-CM

## 2023-11-09 NOTE — Progress Notes (Signed)
Daily Session Note  Patient Details  Name: Rickey Erickson MRN: [id number] Date of Birth: 1956/08/07 Referring Provider:   [name] Pulmonary Rehab Walk Test from 09/18/2023 in [facility] for Heart, Vascular, & Lung Health  Referring Provider [name]       Encounter Date: 11/09/2023  Check In:  Session Check In - 11/09/23 6578       Check-In   Supervising physician immediately available to respond to emergencies [facility] MD immediately available    Physician(s) Bernadene Person, NP    Location [facility]    Staff Present Raford Pitcher, MS, ACSM-CEP, Exercise Physiologist;Casey Erin Sons BS, ACSM-CEP, Exercise Physiologist;Kari Montero Gerre Scull, RN, BSN    Virtual Visit No    Medication changes reported     No    Fall or balance concerns reported    No    Tobacco Cessation No Change    Warm-up and Cool-down Performed as group-led instruction    Resistance Training Performed Yes    VAD Patient? No    PAD/SET Patient? No      Pain Assessment   Currently in Pain? No/denies             Capillary Blood Glucose: No results found for this or any previous visit (from the past 24 hours).    Social History   Tobacco Use  Smoking Status Never  Smokeless Tobacco Never    Goals Met:  Independence with exercise equipment Exercise tolerated well No report of concerns or symptoms today Strength training completed today  Goals Unmet:  Not Applicable  Comments: Service time is from 0810 to 0915    Dr. Mechele Collin is Medical Director for Pulmonary Rehab at [facility].

## 2023-11-14 ENCOUNTER — Encounter (HOSPITAL_COMMUNITY)
Admission: RE | Admit: 2023-11-14 | Discharge: 2023-11-14 | Disposition: A | Discharge status: Home / Self Care | Payer: Medicare Other | Source: Ambulatory Visit | Attending: Pulmonary Disease

## 2023-11-14 VITALS — Wt 121.7 lb

## 2023-11-14 DIAGNOSIS — J849 Interstitial pulmonary disease, unspecified: Secondary | ICD-10-CM

## 2023-11-14 NOTE — Progress Notes (Signed)
Daily Session Note  Patient Details  Name: Rickey Erickson MRN: [id number] Date of Birth: [birth year] Referring Provider:   [name] Pulmonary Rehab Walk Test from 09/18/2023 in [facility] for Heart, Vascular, & Lung Health  Referring Provider [name]       Encounter Date: 11/14/2023  Check In:  Session Check In - 11/14/23 7253       Check-In   Supervising physician immediately available to respond to emergencies [facility] MD immediately available    Physician(s) Bernadene Person, NP    Location [facility]    Staff Present Raford Pitcher, MS, ACSM-CEP, Exercise Physiologist;Casey Katrinka Blazing, RT    Virtual Visit No    Medication changes reported     No    Fall or balance concerns reported    No    Tobacco Cessation No Change    Warm-up and Cool-down Performed as group-led instruction    Resistance Training Performed Yes    VAD Patient? No    PAD/SET Patient? No      Pain Assessment   Currently in Pain? No/denies    Multiple Pain Sites No             Capillary Blood Glucose: No results found for this or any previous visit (from the past 24 hours).   Exercise Prescription Changes - 11/14/23 0900       Response to Exercise   Blood Pressure (Admit) 124/68    Blood Pressure (Exercise) 158/60    Blood Pressure (Exit) 118/62    Heart Rate (Admit) 114 bpm    Heart Rate (Exercise) 125 bpm    Heart Rate (Exit) 112 bpm    Oxygen Saturation (Admit) 97 %    Oxygen Saturation (Exercise) 93 %    Oxygen Saturation (Exit) 95 %    Rating of Perceived Exertion (Exercise) 15    Perceived Dyspnea (Exercise) 3    Duration Continue with 30 min of aerobic exercise without signs/symptoms of physical distress.    Intensity THRR unchanged      Progression   Progression Continue to progress workloads to maintain intensity without signs/symptoms of physical distress.      Resistance Training   Training Prescription Yes    Weight red bands    Reps 10-15     Time 10 Minutes      Recumbant Bike   Level 2    RPM 57    Watts 15    Minutes 15    METs 2.3      Track   Laps 5.5    Minutes 15    METs 1.82      Oxygen   Maintain Oxygen Saturation 88% or higher             Social History   Tobacco Use  Smoking Status Never  Smokeless Tobacco Never    Goals Met:  Proper associated with RPD/PD & O2 Sat Independence with exercise equipment Exercise tolerated well No report of concerns or symptoms today Strength training completed today  Goals Unmet:  Not Applicable  Comments: Service time is from 0809 to 0926.    Dr. Mechele Collin is Medical Director for Pulmonary Rehab at [facility].

## 2023-11-15 NOTE — Progress Notes (Signed)
Pulmonary Individual Treatment Plan  Patient Details  Name: Rickey Erickson MRN: [id number] Date of Birth: April 02, 1956 Referring Provider:   [name] Pulmonary Rehab Walk Test from 09/18/2023 in [facility]  Referring Provider [name]       Initial Encounter Date:  Flowsheet Row Pulmonary Rehab Walk Test from 09/18/2023 in [facility]  Date 09/18/23       Visit Diagnosis: ILD (interstitial lung disease) (HCC)  Patient's Home Medications on Admission:   Current Outpatient Medications:    acetaminophen (TYLENOL) 500 MG tablet, Take 500 mg by mouth every 6 (six) hours as needed for headache (pain)., Disp: , Rfl:    albumin human 25 % bottle, Inject 100 mLs (25 g total) into the vein as needed (Give during dialysis for SBP <100.)., Disp: , Rfl:    apixaban (ELIQUIS) 2.5 MG TABS tablet, Take 2.5 mg by mouth 2 (two) times daily., Disp: , Rfl:    atorvastatin (LIPITOR) 20 MG tablet, Take 20 mg by mouth every morning., Disp: , Rfl:    CALCIUM PO, Take 1 tablet by mouth 2 (two) times daily., Disp: , Rfl:    clopidogrel (PLAVIX) 75 MG tablet, Take 75 mg by mouth every morning., Disp: , Rfl:    Darbepoetin Alfa (ARANESP) 150 MCG/0.3ML SOSY injection, Inject 0.3 mLs (150 mcg total) into the skin every Wednesday at 6 PM., Disp: , Rfl:    midodrine (PROAMATINE) 10 MG tablet, Take 10 mg by mouth See admin instructions. Take 10 mg once daily prior to dialysis on Monday, Wednesday, Friday., Disp: , Rfl:    mirtazapine (REMERON) 30 MG tablet, Take 30 mg by mouth at bedtime., Disp: , Rfl:    pantoprazole (PROTONIX) 40 MG tablet, Take 40 mg by mouth every morning., Disp: , Rfl:    tacrolimus (PROGRAF) 1 MG capsule, Take 1 mg by mouth 2 (two) times daily. Take in addition to 5 mg capsule twice daily, for a total dose of 6 mg twice daily., Disp: , Rfl:    tacrolimus (PROGRAF) 5 MG capsule,  Take 5 mg by mouth 2 (two) times daily. Take in addition to 1 mg capsule twice daily, for a total dose of 6 mg twice daily., Disp: , Rfl:   Past Medical History: Past Medical History:  Diagnosis Date   Anemia    Heart failure (HCC)    Hypertension    Osteoporosis    Renal disorder     Tobacco Use: Social History   Tobacco Use  Smoking Status Never  Smokeless Tobacco Never    Labs: Review Flowsheet  More data may exist      Latest Ref Rng & Units 01/06/2022 09/08/2022 03/07/2023 07/21/2023 07/26/2023  Labs for ITP Cardiac and Pulmonary Rehab  Cholestrol 0 - 200 mg/dL 409  811  - - -  LDL (calc) 0 - 99 mg/dL 90  80  - - -  HDL-C >91 mg/dL 30  39  - - -  Trlycerides <150 mg/dL 90  86  - - -  Hemoglobin A1c 4.8 - 5.6 % 4.9  - - - -  PH, Arterial 7.35 - 7.45 - - 7.497  7.227  7.109  7.060  -  PCO2 arterial 32 - 48 mmHg - - 33.6  56.0  85.7  107.3  -  Bicarbonate 20.0 - 28.0 mmol/L - - 26.0  23.3  27.6  25.0  27.4  30.5  23.9  25.0   TCO2 22 - 32 mmol/L - - 27  25  30  27  30   34  -  Acid-base deficit 0.0 - 2.0 mmol/L - - - 5.0  4.0  6.0  4.0  3.0  6.0  4.7   O2 Saturation % - - 96  93  30  75  86  92  98.2  77.3     Details       Multiple values from one day are sorted in reverse-chronological order         Capillary Blood Glucose: Lab Results  Component Value Date   GLUCAP 80 07/27/2023   GLUCAP 92 07/21/2023   GLUCAP 86 07/21/2023     Pulmonary Assessment Scores:  Pulmonary Assessment Scores     Row Name 09/18/23 0812         ADL UCSD   ADL Phase Entry       mMRC Score   mMRC Score 2             UCSD: Self-administered rating of dyspnea associated with activities of daily living (ADLs) 6-point scale (0 = "not at all" to 5 = "maximal or unable to do because of breathlessness")  Scoring Scores range from 0 to 120.  Minimally important difference is 5 units  CAT: CAT can identify the health impairment of COPD patients and is better correlated with  disease progression.  CAT has a scoring range of zero to 40. The CAT score is classified into four groups of low (less than 10), medium (10 - 20), high (21-30) and very high (31-40) based on the impact level of disease on health status. A CAT score over 10 suggests significant symptoms.  A worsening CAT score could be explained by an exacerbation, poor medication adherence, poor inhaler technique, or progression of COPD or comorbid conditions.  CAT MCID is 2 points  mMRC: mMRC (Modified Medical Research Council) Dyspnea Scale is used to assess the degree of baseline functional disability in patients of respiratory disease due to dyspnea. No minimal important difference is established. A decrease in score of 1 point or greater is considered a positive change.   Pulmonary Function Assessment:  Pulmonary Function Assessment - 09/18/23 0812       Breath   Bilateral Breath Sounds Decreased    Shortness of Breath Yes;Limiting activity             Exercise Target Goals: Exercise Program Goal: Individual exercise prescription set using results from initial 6 min walk test and THRR while considering  patient's activity barriers and safety.   Exercise Prescription Goal: Initial exercise prescription builds to 30-45 minutes a day of aerobic activity, 2-3 days per week.  Home exercise guidelines will be given to patient during program as part of exercise prescription that the participant will acknowledge.  Activity Barriers & Risk Stratification:   6 Minute Walk:  6 Minute Walk     Row Name 09/18/23 0906         6 Minute Walk   Phase Initial     Distance 1140 feet     Walk Time 6 minutes     # of Rest Breaks 0     MPH 2.16     METS 3.68     RPE 13     Perceived Dyspnea  2     VO2 Peak 12.87     Symptoms No     Resting HR  114 bpm     Resting BP 130/68     Resting Oxygen Saturation  95 %     Exercise Oxygen Saturation  during 6 min walk 92 %     Max Ex. HR 131 bpm     Max Ex.  BP 134/68     2 Minute Post BP 122/70       Interval HR   1 Minute HR 122     2 Minute HR 128     3 Minute HR 129     4 Minute HR 128     5 Minute HR 131     6 Minute HR 131     2 Minute Post HR 119     Interval Heart Rate? Yes       Interval Oxygen   Interval Oxygen? Yes     Baseline Oxygen Saturation % 95 %     1 Minute Oxygen Saturation % 94 %     1 Minute Liters of Oxygen 0 L     2 Minute Oxygen Saturation % 94 %     2 Minute Liters of Oxygen 0 L     3 Minute Oxygen Saturation % 92 %     3 Minute Liters of Oxygen 0 L     4 Minute Oxygen Saturation % 92 %     4 Minute Liters of Oxygen 0 L     5 Minute Oxygen Saturation % 93 %     5 Minute Liters of Oxygen 0 L     6 Minute Oxygen Saturation % 92 %     6 Minute Liters of Oxygen 0 L     2 Minute Post Oxygen Saturation % 95 %     2 Minute Post Liters of Oxygen 0 L              Oxygen Initial Assessment:  Oxygen Initial Assessment - 09/18/23 0811       Home Oxygen   Home Oxygen Device None    Sleep Oxygen Prescription CPAP    Liters per minute 2    Home Exercise Oxygen Prescription None    Home Resting Oxygen Prescription None    Compliance with Home Oxygen Use Yes      Initial 6 min Walk   Oxygen Used None      Program Oxygen Prescription   Program Oxygen Prescription None      Intervention   Short Term Goals To learn and exhibit compliance with exercise, home and travel O2 prescription;To learn and understand importance of maintaining oxygen saturations>88%;To learn and demonstrate proper use of respiratory medications;To learn and understand importance of monitoring SPO2 with pulse oximeter and demonstrate accurate use of the pulse oximeter.;To learn and demonstrate proper pursed lip breathing techniques or other breathing techniques.     Long  Term Goals Exhibits compliance with exercise, home  and travel O2 prescription;Verbalizes importance of monitoring SPO2 with pulse oximeter and return  demonstration;Exhibits proper breathing techniques, such as pursed lip breathing or other method taught during program session;Maintenance of O2 saturations>88%;Compliance with respiratory medication             Oxygen Re-Evaluation:  Oxygen Re-Evaluation     Row Name 05/19/23 1043 06/19/23 1235 07/18/23 1601 10/05/23 1622 11/06/23 0857     Program Oxygen Prescription   Program Oxygen Prescription Continuous Continuous Continuous None None   Liters per minute 2 2 2  -- --     Home Oxygen  Home Oxygen Device E-Tanks;Home Concentrator E-Tanks;Home Concentrator E-Tanks;Home Concentrator None None   Sleep Oxygen Prescription Continuous Continuous Continuous CPAP CPAP   Liters per minute 1 1 1 2 2    Home Exercise Oxygen Prescription Continuous Continuous Continuous None None   Liters per minute 2 2 2  -- --   Home Resting Oxygen Prescription Continuous Continuous Continuous None None   Liters per minute 2 2 2  -- --   Compliance with Home Oxygen Use Yes Yes Yes Yes Yes     Goals/Expected Outcomes   Short Term Goals To learn and exhibit compliance with exercise, home and travel O2 prescription;To learn and understand importance of maintaining oxygen saturations>88%;To learn and demonstrate proper use of respiratory medications;To learn and understand importance of monitoring SPO2 with pulse oximeter and demonstrate accurate use of the pulse oximeter.;To learn and demonstrate proper pursed lip breathing techniques or other breathing techniques.  To learn and exhibit compliance with exercise, home and travel O2 prescription;To learn and understand importance of maintaining oxygen saturations>88%;To learn and demonstrate proper use of respiratory medications;To learn and understand importance of monitoring SPO2 with pulse oximeter and demonstrate accurate use of the pulse oximeter.;To learn and demonstrate proper pursed lip breathing techniques or other breathing techniques.  To learn and exhibit  compliance with exercise, home and travel O2 prescription;To learn and understand importance of maintaining oxygen saturations>88%;To learn and demonstrate proper use of respiratory medications;To learn and understand importance of monitoring SPO2 with pulse oximeter and demonstrate accurate use of the pulse oximeter.;To learn and demonstrate proper pursed lip breathing techniques or other breathing techniques.  To learn and exhibit compliance with exercise, home and travel O2 prescription;To learn and understand importance of maintaining oxygen saturations>88%;To learn and demonstrate proper use of respiratory medications;To learn and understand importance of monitoring SPO2 with pulse oximeter and demonstrate accurate use of the pulse oximeter.;To learn and demonstrate proper pursed lip breathing techniques or other breathing techniques.  To learn and exhibit compliance with exercise, home and travel O2 prescription;To learn and understand importance of maintaining oxygen saturations>88%;To learn and demonstrate proper use of respiratory medications;To learn and understand importance of monitoring SPO2 with pulse oximeter and demonstrate accurate use of the pulse oximeter.;To learn and demonstrate proper pursed lip breathing techniques or other breathing techniques.    Long  Term Goals Exhibits compliance with exercise, home  and travel O2 prescription;Verbalizes importance of monitoring SPO2 with pulse oximeter and return demonstration;Exhibits proper breathing techniques, such as pursed lip breathing or other method taught during program session;Maintenance of O2 saturations>88%;Compliance with respiratory medication Exhibits compliance with exercise, home  and travel O2 prescription;Verbalizes importance of monitoring SPO2 with pulse oximeter and return demonstration;Exhibits proper breathing techniques, such as pursed lip breathing or other method taught during program session;Maintenance of O2  saturations>88%;Compliance with respiratory medication Exhibits compliance with exercise, home  and travel O2 prescription;Verbalizes importance of monitoring SPO2 with pulse oximeter and return demonstration;Exhibits proper breathing techniques, such as pursed lip breathing or other method taught during program session;Maintenance of O2 saturations>88%;Compliance with respiratory medication Exhibits compliance with exercise, home  and travel O2 prescription;Verbalizes importance of monitoring SPO2 with pulse oximeter and return demonstration;Exhibits proper breathing techniques, such as pursed lip breathing or other method taught during program session;Maintenance of O2 saturations>88%;Compliance with respiratory medication Exhibits compliance with exercise, home  and travel O2 prescription;Verbalizes importance of monitoring SPO2 with pulse oximeter and return demonstration;Exhibits proper breathing techniques, such as pursed lip breathing or other method taught during program session;Maintenance of O2  saturations>88%;Compliance with respiratory medication   Goals/Expected Outcomes Compliance and understanding of oxygen saturation monitoring and breathing techniques to decrease shortness of breath. Compliance and understanding of oxygen saturation monitoring and breathing techniques to decrease shortness of breath. Compliance and understanding of oxygen saturation monitoring and breathing techniques to decrease shortness of breath. Compliance and understanding of oxygen saturation monitoring and breathing techniques to decrease shortness of breath. Compliance and understanding of oxygen saturation monitoring and breathing techniques to decrease shortness of breath.            Oxygen Discharge (Final Oxygen Re-Evaluation):  Oxygen Re-Evaluation - 11/06/23 0857       Program Oxygen Prescription   Program Oxygen Prescription None      Home Oxygen   Home Oxygen Device None    Sleep Oxygen  Prescription CPAP    Liters per minute 2    Home Exercise Oxygen Prescription None    Home Resting Oxygen Prescription None    Compliance with Home Oxygen Use Yes      Goals/Expected Outcomes   Short Term Goals To learn and exhibit compliance with exercise, home and travel O2 prescription;To learn and understand importance of maintaining oxygen saturations>88%;To learn and demonstrate proper use of respiratory medications;To learn and understand importance of monitoring SPO2 with pulse oximeter and demonstrate accurate use of the pulse oximeter.;To learn and demonstrate proper pursed lip breathing techniques or other breathing techniques.     Long  Term Goals Exhibits compliance with exercise, home  and travel O2 prescription;Verbalizes importance of monitoring SPO2 with pulse oximeter and return demonstration;Exhibits proper breathing techniques, such as pursed lip breathing or other method taught during program session;Maintenance of O2 saturations>88%;Compliance with respiratory medication    Goals/Expected Outcomes Compliance and understanding of oxygen saturation monitoring and breathing techniques to decrease shortness of breath.             Initial Exercise Prescription:  Initial Exercise Prescription - 09/18/23 0900       Date of Initial Exercise RX and Referring Provider   Date 09/18/23    Referring Provider [name]    Expected Discharge Date 12/14/23      Oxygen   Maintain Oxygen Saturation 88% or higher      Recumbant Bike   Level 1    Minutes 15    METs 2.5      Track   Minutes 15    METs 2.5      Intensity   THRR 40-80% of Max Heartrate 62-124    Ratings of Perceived Exertion 11-13    Perceived Dyspnea 0-4      Progression   Progression Continue to progress workloads to maintain intensity without signs/symptoms of physical distress.      Resistance Training   Training Prescription Yes    Weight red bands    Reps 10-15             Perform Capillary  Blood Glucose checks as needed.  Exercise Prescription Changes:   Exercise Prescription Changes     Row Name 05/30/23 0900 06/13/23 0900 06/27/23 0900 07/06/23 0947 10/03/23 0900     Response to Exercise   Blood Pressure (Admit) 120/64 130/70 114/68 116/64 112/68   Blood Pressure (Exercise) 160/72 172/66 124/58 -- --   Blood Pressure (Exit) 118/62 112/66 120/68 104/60 112/64   Heart Rate (Admit) 111 bpm 107 bpm 111 bpm 115 bpm 118 bpm   Heart Rate (Exercise) 124 bpm 126 bpm 124 bpm 122 bpm 131 bpm  Heart Rate (Exit) 117 bpm 107 bpm 95 bpm 109 bpm 117 bpm   Oxygen Saturation (Admit) 100 % 98 % 95 % 100 % 100 %   Oxygen Saturation (Exercise) 100 % 91 % 92 % 96 % 96 %   Oxygen Saturation (Exit) 100 % 96 % 97 % 100 % 98 %   Rating of Perceived Exertion (Exercise) 15 15 12 15 13    Perceived Dyspnea (Exercise) 3 4 1 3 3    Duration Continue with 30 min of aerobic exercise without signs/symptoms of physical distress. Continue with 30 min of aerobic exercise without signs/symptoms of physical distress. Continue with 30 min of aerobic exercise without signs/symptoms of physical distress. Continue with 30 min of aerobic exercise without signs/symptoms of physical distress. Continue with 30 min of aerobic exercise without signs/symptoms of physical distress.   Intensity THRR unchanged THRR unchanged THRR unchanged THRR unchanged THRR New  62-150     Progression   Progression Continue to progress workloads to maintain intensity without signs/symptoms of physical distress. Continue to progress workloads to maintain intensity without signs/symptoms of physical distress. Continue to progress workloads to maintain intensity without signs/symptoms of physical distress. Continue to progress workloads to maintain intensity without signs/symptoms of physical distress. Continue to progress workloads to maintain intensity without signs/symptoms of physical distress.   Average METs -- -- -- 2.7 --     Resistance  Training   Training Prescription Yes Yes Yes Yes Yes   Weight red bands red bands red bands red bands red bands   Reps 10-15 10-15 10-15 10-15 10-15   Time 10 Minutes 10 Minutes 10 Minutes 10 Minutes 10 Minutes     Oxygen   Oxygen --  Weaned to room air -- -- Continuous --   Liters -- -- -- 2 --     Recumbant Bike   Level -- -- -- -- 2   Minutes -- -- -- -- 15   METs -- -- -- -- 2.3     NuStep   Level 3 3 3 3  --   SPM 80 -- -- 71 --   Minutes 15 15 15 15  --   METs 3.1 3 3  2.7 --     Track   Laps 5.5 5 5  5.5 6.5   Minutes 15 15 15 15 15    METs 1.85 1.77 1.77 1.82 2     Oxygen   Maintain Oxygen Saturation 88% or higher 88% or higher -- 88% or higher --    Row Name 10/17/23 0900 10/31/23 0900 11/14/23 0900         Response to Exercise   Blood Pressure (Admit) 114/64 140/78 124/68     Blood Pressure (Exercise) 138/70 -- 158/60     Blood Pressure (Exit) 132/68 124/70 118/62     Heart Rate (Admit) 112 bpm 114 bpm 114 bpm     Heart Rate (Exercise) 126 bpm 131 bpm 125 bpm     Heart Rate (Exit) 115 bpm 113 bpm 112 bpm     Oxygen Saturation (Admit) 100 % 100 % 97 %     Oxygen Saturation (Exercise) 95 % 90 % 93 %     Oxygen Saturation (Exit) 100 % 99 % 95 %     Rating of Perceived Exertion (Exercise) 12 14 15      Perceived Dyspnea (Exercise) 2 3 3      Duration Continue with 30 min of aerobic exercise without signs/symptoms of physical distress. Continue with 30 min of  aerobic exercise without signs/symptoms of physical distress. Continue with 30 min of aerobic exercise without signs/symptoms of physical distress.     Intensity THRR unchanged THRR unchanged THRR unchanged       Progression   Progression -- Continue to progress workloads to maintain intensity without signs/symptoms of physical distress. Continue to progress workloads to maintain intensity without signs/symptoms of physical distress.       Resistance Training   Training Prescription Yes Yes Yes     Weight red  bands red bands red bands     Reps 10-15 10-15 10-15     Time 10 Minutes 10 Minutes 10 Minutes       Recumbant Bike   Level 2 3 2      RPM -- 55 57     Watts -- 21 15     Minutes 15 15 15      METs 2.4 2.5 2.3       Track   Laps 6.5 5 5.5     Minutes 15 15 15      METs 2 1.77 1.82       Oxygen   Maintain Oxygen Saturation -- -- 88% or higher              Exercise Comments:   Exercise Comments     Row Name 06/06/23 1539 09/26/23 0924         Exercise Comments Completed home [facility]. Skip is currently exercising at home. He uses the seated cycle machine and is walking 2-7 days/wk for 15-20 min/day. I encouraged him to increase his time to 30 min/day. Skip tries walking outside when the weather is good. I recommended he keep walking outside weather permitted. Skip agreed with my recommendations. I am confident in Skip completing an exercise regimen at home. He seems motivated to exercise and improve his functional capacity. Pt completed first day of group exercise. He exercised 15 min on the recumbent bike, level 2, METs 2.2. He then walked the track for 15 min, 2.0 METs, with seated breaks. Pt is feeling well and tolerated exercise better than last time he did pulmonary rehab. He performed warm up and cool down mostly standing, including squats. Discussed METS with good reception. Pt happy to be back.               Exercise Goals and Review:   Exercise Goals     Row Name 05/19/23 1039 06/19/23 1228 09/18/23 0813         Exercise Goals   Increase Physical Activity Yes Yes Yes     Intervention Provide advice, education, support and counseling about physical activity/exercise needs.;Develop an individualized exercise prescription for aerobic and resistive training based on initial evaluation findings, risk stratification, comorbidities and participant's personal goals. Provide advice, education, support and counseling about physical activity/exercise needs.;Develop an  individualized exercise prescription for aerobic and resistive training based on initial evaluation findings, risk stratification, comorbidities and participant's personal goals. Provide advice, education, support and counseling about physical activity/exercise needs.;Develop an individualized exercise prescription for aerobic and resistive training based on initial evaluation findings, risk stratification, comorbidities and participant's personal goals.     Expected Outcomes Short Term: Attend rehab on a regular basis to increase amount of physical activity.;Long Term: Exercising regularly at least 3-5 days a week.;Long Term: Add in home exercise to make exercise part of routine and to increase amount of physical activity. Short Term: Attend rehab on a regular basis to increase amount of physical activity.;Long Term: Exercising  regularly at least 3-5 days a week.;Long Term: Add in home exercise to make exercise part of routine and to increase amount of physical activity. Short Term: Attend rehab on a regular basis to increase amount of physical activity.;Long Term: Exercising regularly at least 3-5 days a week.;Long Term: Add in home exercise to make exercise part of routine and to increase amount of physical activity.     Increase Strength and Stamina Yes Yes Yes     Intervention Provide advice, education, support and counseling about physical activity/exercise needs.;Develop an individualized exercise prescription for aerobic and resistive training based on initial evaluation findings, risk stratification, comorbidities and participant's personal goals. Provide advice, education, support and counseling about physical activity/exercise needs.;Develop an individualized exercise prescription for aerobic and resistive training based on initial evaluation findings, risk stratification, comorbidities and participant's personal goals. Provide advice, education, support and counseling about physical activity/exercise  needs.;Develop an individualized exercise prescription for aerobic and resistive training based on initial evaluation findings, risk stratification, comorbidities and participant's personal goals.     Expected Outcomes Short Term: Increase workloads from initial exercise prescription for resistance, speed, and METs.;Short Term: Perform resistance training exercises routinely during rehab and add in resistance training at home;Long Term: Improve cardiorespiratory fitness, muscular endurance and strength as measured by increased METs and functional capacity ( ) Short Term: Increase workloads from initial exercise prescription for resistance, speed, and METs.;Short Term: Perform resistance training exercises routinely during rehab and add in resistance training at home;Long Term: Improve cardiorespiratory fitness, muscular endurance and strength as measured by increased METs and functional capacity ( ) Short Term: Increase workloads from initial exercise prescription for resistance, speed, and METs.;Short Term: Perform resistance training exercises routinely during rehab and add in resistance training at home;Long Term: Improve cardiorespiratory fitness, muscular endurance and strength as measured by increased METs and functional capacity ( )     Able to understand and use rate of perceived exertion (RPE) scale Yes Yes Yes     Intervention Provide education and explanation on how to use RPE scale Provide education and explanation on how to use RPE scale Provide education and explanation on how to use RPE scale     Expected Outcomes Short Term: Able to use RPE daily in rehab to express subjective intensity level;Long Term:  Able to use RPE to guide intensity level when exercising independently Short Term: Able to use RPE daily in rehab to express subjective intensity level;Long Term:  Able to use RPE to guide intensity level when exercising independently Short Term: Able to use RPE daily in rehab to express  subjective intensity level;Long Term:  Able to use RPE to guide intensity level when exercising independently     Able to understand and use Dyspnea scale Yes Yes Yes     Intervention Provide education and explanation on how to use Dyspnea scale Provide education and explanation on how to use Dyspnea scale Provide education and explanation on how to use Dyspnea scale     Expected Outcomes Short Term: Able to use Dyspnea scale daily in rehab to express subjective sense of shortness of breath during exertion;Long Term: Able to use Dyspnea scale to guide intensity level when exercising independently Short Term: Able to use Dyspnea scale daily in rehab to express subjective sense of shortness of breath during exertion;Long Term: Able to use Dyspnea scale to guide intensity level when exercising independently Short Term: Able to use Dyspnea scale daily in rehab to express subjective sense of shortness of breath during exertion;Long Term:  Able to use Dyspnea scale to guide intensity level when exercising independently     Knowledge and understanding of Target Heart Rate Range (THRR) Yes Yes Yes     Intervention Provide education and explanation of THRR including how the numbers were predicted and where they are located for reference Provide education and explanation of THRR including how the numbers were predicted and where they are located for reference Provide education and explanation of THRR including how the numbers were predicted and where they are located for reference     Expected Outcomes Short Term: Able to state/look up THRR;Long Term: Able to use THRR to govern intensity when exercising independently;Short Term: Able to use daily as guideline for intensity in rehab Short Term: Able to state/look up THRR;Long Term: Able to use THRR to govern intensity when exercising independently;Short Term: Able to use daily as guideline for intensity in rehab Short Term: Able to state/look up THRR;Long Term: Able to  use THRR to govern intensity when exercising independently;Short Term: Able to use daily as guideline for intensity in rehab     Understanding of Exercise Prescription Yes Yes Yes     Intervention Provide education, explanation, and written materials on patient's individual exercise prescription Provide education, explanation, and written materials on patient's individual exercise prescription Provide education, explanation, and written materials on patient's individual exercise prescription     Expected Outcomes Short Term: Able to explain program exercise prescription;Long Term: Able to explain home exercise prescription to exercise independently Short Term: Able to explain program exercise prescription;Long Term: Able to explain home exercise prescription to exercise independently Short Term: Able to explain program exercise prescription;Long Term: Able to explain home exercise prescription to exercise independently              Exercise Goals Re-Evaluation :  Exercise Goals Re-Evaluation     Row Name 05/19/23 1039 06/19/23 1228 07/18/23 1557 07/27/23 1552 09/18/23 0813     Exercise Goal Re-Evaluation   Exercise Goals Review Increase Physical Activity;Able to understand and use Dyspnea scale;Understanding of Exercise Prescription;Increase Strength and Stamina;Knowledge and understanding of Target Heart Rate Range (THRR);Able to understand and use rate of perceived exertion (RPE) scale Increase Physical Activity;Able to understand and use Dyspnea scale;Understanding of Exercise Prescription;Increase Strength and Stamina;Knowledge and understanding of Target Heart Rate Range (THRR);Able to understand and use rate of perceived exertion (RPE) scale Increase Physical Activity;Able to understand and use Dyspnea scale;Understanding of Exercise Prescription;Increase Strength and Stamina;Knowledge and understanding of Target Heart Rate Range (THRR);Able to understand and use rate of perceived exertion  (RPE) scale Increase Physical Activity;Able to understand and use Dyspnea scale;Understanding of Exercise Prescription;Increase Strength and Stamina;Knowledge and understanding of Target Heart Rate Range (THRR);Able to understand and use rate of perceived exertion (RPE) scale Increase Physical Activity;Able to understand and use Dyspnea scale;Understanding of Exercise Prescription;Increase Strength and Stamina;Knowledge and understanding of Target Heart Rate Range (THRR);Able to understand and use rate of perceived exertion (RPE) scale   Comments Skip has completed 5 exercise sessions. He exercises for 15 min on the Nustep and track. Pt averages 2.7 METs at level 3 on the Nustep and 2.23 METs on the track. Skip performs the warmup and cooldown standing/ seated dependent on his shortness of breath. He has increased his workload for the Nustep as METs have slightly increased. It is too soon to notate any major progressions. Will continue to monitor and progress as able. Skip has completed 13 exercise sessions. He exercises for 15 min on the  Nustep and track. Pt averages 3.4 METs at level 3 on the Nustep and 2.08 METs on the track. Skip performs the warmup and cooldown standing/ seated dependent on his shortness of breath. His METs have increased for the Nustep and have remained the same for the track. Skip has most likely reached his functional capacity. Skip sits less during the warmup and cooldown. He feels that he have improved since starting PR. Skip has completed 19 exercise sessions. He exercises for 15 min on the Nustep and track. Pt averages 2.7 METs at level 3 on the Nustep and 1.77 METs on the track. Skip performs the warmup and cooldown standing/ seated dependent on his shortness of breath. Skip's METs have remained relatively the same because he has had other health issues and had to miss class. He is motivated to exercise as he tried to exercise at his best ability today. We have discussed home exercise.  Skip is trying exercise at home as often as he can. Will continue to monitor and progress as able. Skip completed 19 exercise sessions. Peak METs were 3.4 on the Nustep and 1.77 on the track. He is being discharged due to hospitalization. Skip is scheduled begin exercise again on 12/3.   Expected Outcomes Through exercise at rehab and home, the patient will decrease shortness of breath with daily activities and feel confident in carrying out an exercise regimen at home. Through exercise at rehab and home, the patient will decrease shortness of breath with daily activities and feel confident in carrying out an exercise regimen at home. Through exercise at rehab and home, the patient will decrease shortness of breath with daily activities and feel confident in carrying out an exercise regimen at home. Through exercise at rehab and home, the patient will decrease shortness of breath with daily activities and feel confident in carrying out an exercise regimen at home. Through exercise at rehab and home, the patient will decrease shortness of breath with daily activities and feel confident in carrying out an exercise regimen at home.    Row Name 10/05/23 1620 11/06/23 0855           Exercise Goal Re-Evaluation   Exercise Goals Review Increase Physical Activity;Able to understand and use Dyspnea scale;Understanding of Exercise Prescription;Increase Strength and Stamina;Knowledge and understanding of Target Heart Rate Range (THRR);Able to understand and use rate of perceived exertion (RPE) scale Increase Physical Activity;Able to understand and use Dyspnea scale;Understanding of Exercise Prescription;Increase Strength and Stamina;Knowledge and understanding of Target Heart Rate Range (THRR);Able to understand and use rate of perceived exertion (RPE) scale      Comments Skip has completed 3 exercise sessions. He exercises for 15 min on the recumbent bike and track. Skip averages 2.4 METs at level 2 on the  recumbent bike and 1.92 METs on the track. He performs the warmup and cooldown mostly standing dependent on his shortness of breath. It is too soon to notate any discernable differences. Will continue to monitor and progress as able. Skip has completed 9 exercise sessions. He exercises for 15 min on the recumbent bike and track. Skip averages 2.5 METs at level 3 on the recumbent bike and 1.77 METs on the track. He performs the warmup and cooldown mostly standing dependent on his shortness of breath. Skip has increased his workload for the recumbent bike as METs have slightly increased. His track METs have remained relatively the same. Skip also seems to do more of the warmup and cooldown standing. Will continue to monitor  and progress as able.      Expected Outcomes Through exercise at rehab and home, the patient will decrease shortness of breath with daily activities and feel confident in carrying out an exercise regimen at home. Through exercise at rehab and home, the patient will decrease shortness of breath with daily activities and feel confident in carrying out an exercise regimen at home.               Discharge Exercise Prescription (Final Exercise Prescription Changes):  Exercise Prescription Changes - 11/14/23 0900       Response to Exercise   Blood Pressure (Admit) 124/68    Blood Pressure (Exercise) 158/60    Blood Pressure (Exit) 118/62    Heart Rate (Admit) 114 bpm    Heart Rate (Exercise) 125 bpm    Heart Rate (Exit) 112 bpm    Oxygen Saturation (Admit) 97 %    Oxygen Saturation (Exercise) 93 %    Oxygen Saturation (Exit) 95 %    Rating of Perceived Exertion (Exercise) 15    Perceived Dyspnea (Exercise) 3    Duration Continue with 30 min of aerobic exercise without signs/symptoms of physical distress.    Intensity THRR unchanged      Progression   Progression Continue to progress workloads to maintain intensity without signs/symptoms of physical distress.      Resistance  Training   Training Prescription Yes    Weight red bands    Reps 10-15    Time 10 Minutes      Recumbant Bike   Level 2    RPM 57    Watts 15    Minutes 15    METs 2.3      Track   Laps 5.5    Minutes 15    METs 1.82      Oxygen   Maintain Oxygen Saturation 88% or higher             Nutrition:  Target Goals: Understanding of nutrition guidelines, daily intake of sodium 1500mg , cholesterol 200mg , calories 30% from fat and 7% or less from saturated fats, daily to have 5 or more servings of fruits and vegetables.  Biometrics:  Pre Biometrics - 09/18/23 0805       Pre Biometrics   Grip Strength 22 kg              Nutrition Therapy Plan and Nutrition Goals:  Nutrition Therapy & Goals - 07/25/23 1140       Nutrition Therapy   Diet Heart Healthy Diet    Drug/Food Interactions Statins/Certain Fruits      Personal Nutrition Goals   Nutrition Goal Patient to identify strategies for weight maintenance/ weight gain of 0.5-2.0# per week.   goal in progress.   Comments Goals in progress. Skip continues regular follow-up for kidney transplant; he has required dialysis since heart transplant in 2021. He currently receives hemodialysis M/W/F. His weight has been stable since hospitalization related to pneumonia in May 2024, and per diaylsis notes, his typical weight has been ~60kg. He is down about 3.5# since orientation to our program (start weight was 61.1kg). He lives with his sister who is a great support and does all of the grocery shopping and cooking. Skip reports drinking 1 Boost Breeze daily (250kcals, 9g protein) and 1 [employer] Milkshake (580kcals, 13g protein) daily to aid with weight gain/weight maintenance. We have discussed multiple strategies for weight gain including increasing eating frequency, protein supplements, increasing calories from fat/protein/carbohydrates, etc. He  is currently hospitalized for acute respiratory failure; will continue to monitor  goals upon patient's return. Patient will benefit from participation in pulmonary rehab for nutrition, exercise, and lifestyle modification.      Intervention Plan   Intervention Prescribe, educate and counsel regarding individualized specific dietary modifications aiming towards targeted core components such as weight, hypertension, lipid management, diabetes, heart failure and other comorbidities.;Nutrition handout(s) given to patient.    Expected Outcomes Long Term Goal: Adherence to prescribed nutrition plan.;Short Term Goal: Understand basic principles of dietary content, such as calories, fat, sodium, cholesterol and nutrients.             Nutrition Assessments:  MEDIFICTS Score Key: >=70 Need to make dietary changes  40-70 Heart Healthy Diet <= 40 Therapeutic Level Cholesterol Diet  Flowsheet Row PULMONARY REHAB OTHER RESPIRATORY from 05/02/2023 in [facility]  Picture Your Plate Total Score on Admission 62      Picture Your Plate Scores: <63 Unhealthy dietary pattern with much room for improvement. 41-50 Dietary pattern unlikely to meet recommendations for good health and room for improvement. 51-60 More healthful dietary pattern, with some room for improvement.  >60 Healthy dietary pattern, although there may be some specific behaviors that could be improved.    Nutrition Goals Re-Evaluation:  Nutrition Goals Re-Evaluation     Row Name 05/30/23 1106 06/29/23 0934 07/25/23 1140         Goals   Current Weight 132 lb 0.9 oz (59.9 kg) 133 lb 9.6 oz (60.6 kg) 131 lb 2.8 oz (59.5 kg)     Comment he continues regular follow-up with [facility] s/p heart transplant. Other most recent labs  GFR 11, Cr 5.5, PTH 194, triglycerides 161, HDL 34, Albumin 2.3 he continues regular follow-up with [facility] s/p heart transplant. Other most recent labs GFR 11, Cr 5.5, PTH 194, triglycerides 161, HDL 34, Albumin 2.3 Cr  4.99, Phos 5.6, [id number]; other most recent labs monitored by [facility] transplant team, triglycerides 161, HDL 34,     Expected Outcome Goals in progress. Skip continues regular follow-up for kidney transplant; he has required dialysis since heart transplant in 2021. He currently receives hemodialysis M/W/F. His weight has been stable since hospitalization related to pneumonia in May 2024, and per diaylsis notes, his typical weight has been ~60kg. He is down about 4.6# since orientation to our program (start weight was 61.1kg). He lives with his sister who is a great support and does all of the grocery shopping and cooking. Skip reports drinking 1 Boost Breeze daily (250kcals, 9g protein) and 1 [employer]fil[employer] Milkshake (580kcals, 13g protein) daily to aid with weight gain/weight maintenance. We have discussed multiple strategies for weight gain including increasing eating frequency, protein supplements, increasing calories from fat/protein/carbohydrates, etc. Patient will benefit from participation in pulmonary rehab for nutrition, exercise, and lifestyle modification. Goals in progress. Skip continues regular follow-up for kidney transplant; he has required dialysis since heart transplant in 2021. He currently receives hemodialysis M/W/F. His weight has been stable since hospitalization related to pneumonia in May 2024, and per diaylsis notes, his typical weight has been ~60kg. He is down about 2.2# since orientation to our program (start weight was 61.1kg). He lives with his sister who is a great support and does all of the grocery shopping and cooking. Skip reports drinking 1 Boost Breeze daily (250kcals, 9g protein) and 1 Chick-fil-[employer] Milkshake (580kcals, 13g protein) daily to aid with weight gain/weight maintenance. We  have discussed multiple strategies for weight gain including increasing eating frequency, protein supplements, increasing calories from fat/protein/carbohydrates, etc. Patient will benefit from  participation in pulmonary rehab for nutrition, exercise, and lifestyle modification. Goals in progress. Skip continues regular follow-up for kidney transplant; he has required dialysis since heart transplant in 2021. He currently receives hemodialysis M/W/F. His weight has been stable since hospitalization related to pneumonia in May 2024, and per diaylsis notes, his typical weight has been ~60kg. He is down about 3.5# since orientation to our program (start weight was 61.1kg). He lives with his sister who is a great support and does all of the grocery shopping and cooking. Skip reports drinking 1 Boost Breeze daily (250kcals, 9g protein) and 1 Chick-fil-a Milkshake (580kcals, 13g protein) daily to aid with weight gain/weight maintenance. We have discussed multiple strategies for weight gain including increasing eating frequency, protein supplements, increasing calories from fat/protein/carbohydrates, etc. He is currently hospitalized for acute respiratory failure; will continue to monitor goals upon patient's return. Patient will benefit from participation in pulmonary rehab for nutrition, exercise, and lifestyle modification.              Nutrition Goals Discharge (Final Nutrition Goals Re-Evaluation):  Nutrition Goals Re-Evaluation - 07/25/23 1140       Goals   Current Weight 131 lb 2.8 oz (59.5 kg)    Comment Cr 4.99, Phos 5.6, [id number]; other most recent labs monitored by [facility] transplant team, triglycerides 161, HDL 34,    Expected Outcome Goals in progress. Skip continues regular follow-up for kidney transplant; he has required dialysis since heart transplant in 2021. He currently receives hemodialysis M/W/F. His weight has been stable since hospitalization related to pneumonia in May 2024, and per diaylsis notes, his typical weight has been ~60kg. He is down about 3.5# since orientation to our program (start weight was 61.1kg). He lives with his sister who is a great support and does all of the  grocery shopping and cooking. Skip reports drinking 1 Boost Breeze daily (250kcals, 9g protein) and 1 Chick-fil-a Milkshake (580kcals, 13g protein) daily to aid with weight gain/weight maintenance. We have discussed multiple strategies for weight gain including increasing eating frequency, protein supplements, increasing calories from fat/protein/carbohydrates, etc. He is currently hospitalized for acute respiratory failure; will continue to monitor goals upon patient's return. Patient will benefit from participation in pulmonary rehab for nutrition, exercise, and lifestyle modification.             Psychosocial: Target Goals: Acknowledge presence or absence of significant depression and/or stress, maximize coping skills, provide positive support system. Participant is able to verbalize types and ability to use techniques and skills needed for reducing stress and depression.  Initial Review & Psychosocial Screening:  Initial Psych Review & Screening - 09/18/23 0810       Initial Review   Current issues with None Identified      Family Dynamics   Good Support System? Yes    Comments sister and other family members      Barriers   Psychosocial barriers to participate in program There are no identifiable barriers or psychosocial needs.      Screening Interventions   Interventions Encouraged to exercise             Quality of Life Scores:  Scores of 19 and below usually indicate a poorer quality of life in these areas.  A difference of  2-3 points is a clinically meaningful difference.  A difference of 2-3 points in the  total score of the Quality of Life Index has been associated with significant improvement in overall quality of life, self-image, physical symptoms, and general health in studies assessing change in quality of life.  PHQ-9: Review Flowsheet       09/18/2023 04/24/2023  Depression screen PHQ 2/9  Decreased Interest 0 0  Down, Depressed, Hopeless 0 0  PHQ - 2  Score 0 0  Altered sleeping 1 0  Tired, decreased energy 0 0  Change in appetite 0 0  Feeling bad or failure about yourself  0 0  Trouble concentrating 0 0  Moving slowly or fidgety/restless 0 0  Suicidal thoughts 0 0  PHQ-9 Score 1 0  Difficult doing work/chores Not difficult at all Not difficult at all   Interpretation of Total Score  Total Score Depression Severity:  1-4 = Minimal depression, 5-9 = Mild depression, 10-14 = Moderate depression, 15-19 = Moderately severe depression, 20-27 = Severe depression   Psychosocial Evaluation and Intervention:  Psychosocial Evaluation - 09/18/23 0810       Psychosocial Evaluation & Interventions   Interventions Encouraged to exercise with the program and follow exercise prescription    Comments Skip denies any psychosocial barriers to exercise.    Expected Outcomes For Skip to participate in PR free of psychosocial barriers.    Continue Psychosocial Services  No Follow up required             Psychosocial Re-Evaluation:  Psychosocial Re-Evaluation     Row Name 05/24/23 1223 06/19/23 1210 07/19/23 0913 09/20/23 0803 10/06/23 1422     Psychosocial Re-Evaluation   Current issues with None Identified None Identified None Identified None Identified None Identified   Comments No new psychosocial barriers or concerns at this time. Skip is enjoying PR and denies any stressors. He states that his mental health is doing "fine". Skip states that his sister does a good job taking care of him and monitoring his overnight oxygen saturations. He is tolerating his HD sessions on T, Th, Sat well. He stated he is getting worked up for a kidney transplant. Skip stated he is looking forward to traveling and going to baseball games with his sister. Skip denies any needs at this time. Skip had to be discharged from the program on 07/26/23. His sister called [place] and stated he was hospitalized for bilateral pneumonia. He has been transferred from [facility] to [facility]. Skip completed 19 sessions of Pulm Rehab. Skip never complained while in class, worked hard, even when he didn't feel well. Skip denied any psychosocial issues or concerns at his last re-evaluation. He has great support from his sister. Skip is scheduled to start the program on 09/26/23. No new barriers or concerns since orientation. Skip continues to deny any psy/soc needs during his monthly psychosocial evaluation. He states that his health pretty much dictates his plans for the day. He attends dialysis 3 days a week, pulmonary rehab 2 days a week and filters in MD appointments in between. 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 He states he has great support from his sister who drives him to and from appointments as well as serves as his primary caretaker. He denies any needs at this time. Skip has positive coping skills with a positive outlook.   Expected Outcomes For Skip to attend PR without any barriers or concerns For Skip to attend PR without any barriers or concerns For Skip to continue to exercise for  stress relief post hospitalization For Skip to participate in PR free of any psychosocial barriers or concerns. For Skip to continue in PR free of any psychosocial barriers or concerns, to use positive coping skills to manage stress.   Interventions Encouraged to attend Pulmonary Rehabilitation for the exercise Encouraged to attend Pulmonary Rehabilitation for the exercise -- Encouraged to attend Pulmonary Rehabilitation for the exercise Encouraged to attend Pulmonary Rehabilitation for the exercise   Continue Psychosocial Services  No Follow up required No Follow up required No Follow up required No Follow up required No Follow up required    Row Name 11/13/23 1003             Psychosocial Re-Evaluation   Current issues with None Identified       Comments Skip continues to deny any psy/soc needs during his monthly psychosocial evaluation.  He states that his health pretty much dictates his plans for the day. He attends dialysis 3 days a week, pulmonary rehab 2 days a week and filters in MD appointments in between. 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 He states he has great support from his sister who drives him to and from appointments as well as serves as his primary caretaker. He denies any needs at this time. Skip has positive coping skills with a positive outlook.       Expected Outcomes For Skip to continue in PR free of any psychosocial barriers or concerns, to use positive coping skills to manage stress.       Interventions Encouraged to attend Pulmonary Rehabilitation for the exercise       Continue Psychosocial Services  No Follow up required                Psychosocial Discharge (Final Psychosocial Re-Evaluation):  Psychosocial Re-Evaluation - 11/13/23 1003       Psychosocial Re-Evaluation   Current issues with None Identified    Comments Skip continues to deny any psy/soc needs during his monthly psychosocial evaluation. He states that his health pretty much dictates his plans for the day. He attends dialysis 3 days a week, pulmonary rehab 2 days a week and filters in MD appointments in between. 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 He states he has great support from his sister who drives him to and from appointments as well as serves as his primary caretaker. He denies any needs at this time. Skip has positive coping skills with a positive outlook.    Expected Outcomes For Skip to continue in PR free of any psychosocial barriers or concerns, to use positive coping skills to manage stress.    Interventions Encouraged to attend Pulmonary Rehabilitation for the exercise    Continue Psychosocial Services  No Follow up required             Education: Education Goals: Education classes will be provided on a weekly basis, covering  required topics. Participant will state understanding/return demonstration of topics presented.  Learning Barriers/Preferences:   Education Topics: Know Your Numbers Group instruction that is supported by a PowerPoint presentation. Instructor discusses importance of knowing and understanding resting, exercise, and post-exercise oxygen saturation, heart rate, and blood pressure. Oxygen saturation, heart rate, blood pressure, rating of perceived exertion, and dyspnea are reviewed along with a normal range for these values.  Flowsheet Row PULMONARY REHAB OTHER RESPIRATORY from 10/26/2023 in [facility] for Heart, Vascular, &  Lung Health  Date 10/26/23  Educator EP  Instruction Review Code 1- Verbalizes Understanding       Exercise for the Pulmonary Patient Group instruction that is supported by a PowerPoint presentation. Instructor discusses benefits of exercise, core components of exercise, frequency, duration, and intensity of an exercise routine, importance of utilizing pulse oximetry during exercise, safety while exercising, and options of places to exercise outside of rehab.  Flowsheet Row PULMONARY REHAB OTHER RESPIRATORY from 10/19/2023 in [facility] for Heart, Vascular, & Lung Health  Date 10/19/23  Educator EP  Instruction Review Code 1- Verbalizes Understanding       MET Level  Group instruction provided by PowerPoint, verbal discussion, and written material to support subject matter. Instructor reviews what METs are and how to increase METs.  Flowsheet Row PULMONARY REHAB OTHER RESPIRATORY from 06/22/2023 in [facility] for Heart, Vascular, & Lung Health  Date 06/22/23  Educator EP  Instruction Review Code 1- Verbalizes Understanding       Pulmonary Medications Verbally interactive group education provided by instructor with focus on inhaled medications and proper administration. Flowsheet Row  PULMONARY REHAB OTHER RESPIRATORY from 10/12/2023 in [facility] for Heart, Vascular, & Lung Health  Date 10/12/23       Anatomy and Physiology of the Respiratory System Group instruction provided by PowerPoint, verbal discussion, and written material to support subject matter. Instructor reviews respiratory cycle and anatomical components of the respiratory system and their functions. Instructor also reviews differences in obstructive and restrictive respiratory diseases with examples of each.  Flowsheet Row PULMONARY REHAB OTHER RESPIRATORY from 10/05/2023 in [facility] for Heart, Vascular, & Lung Health  Date 10/05/23  Educator RT  Instruction Review Code 1- Verbalizes Understanding       Oxygen Safety Group instruction provided by PowerPoint, verbal discussion, and written material to support subject matter. There is an overview of "What is Oxygen" and "Why do we need it".  Instructor also reviews how to create a safe environment for oxygen use, the importance of using oxygen as prescribed, and the risks of noncompliance. There is a brief discussion on traveling with oxygen and resources the patient may utilize. Flowsheet Row PULMONARY REHAB OTHER RESPIRATORY from 11/02/2023 in [facility] for Heart, Vascular, & Lung Health  Date 11/02/23  Educator RN  Instruction Review Code 1- Verbalizes Understanding       Oxygen Use Group instruction provided by PowerPoint, verbal discussion, and written material to discuss how supplemental oxygen is prescribed and different types of oxygen supply systems. Resources for more information are provided.  Flowsheet Row PULMONARY REHAB OTHER RESPIRATORY from 11/09/2023 in [facility] for Heart, Vascular, & Lung Health  Date 11/09/23  Educator RT  Instruction Review Code 1- Verbalizes Understanding       Breathing Techniques Group instruction that is  supported by demonstration and informational handouts. Instructor discusses the benefits of pursed lip and diaphragmatic breathing and detailed demonstration on how to perform both.  Flowsheet Row PULMONARY REHAB OTHER RESPIRATORY from 05/25/2023 in [facility] for Heart, Vascular, & Lung Health  Date 05/25/23  Educator RN  Instruction Review Code 1- Verbalizes Understanding        Risk Factor Reduction Group instruction that is supported by a PowerPoint presentation. Instructor discusses the definition of a risk factor, different risk factors for pulmonary disease, and how the heart and lungs work  together. Flowsheet Row PULMONARY REHAB OTHER RESPIRATORY from 06/15/2023 in [facility] for Heart, Vascular, & Lung Health  Date 06/15/23  Educator EP  Instruction Review Code 1- Verbalizes Understanding       Pulmonary Diseases Group instruction provided by PowerPoint, verbal discussion, and written material to support subject matter. Instructor gives an overview of the different type of pulmonary diseases. There is also a discussion on risk factors and symptoms as well as ways to manage the diseases. Flowsheet Row PULMONARY REHAB OTHER RESPIRATORY from 09/28/2023 in [facility] for Heart, Vascular, & Lung Health  Date 09/28/23  Educator RT  Instruction Review Code 1- Verbalizes Understanding       Stress and Energy Conservation Group instruction provided by PowerPoint, verbal discussion, and written material to support subject matter. Instructor gives an overview of stress and the impact it can have on the body. Instructor also reviews ways to reduce stress. There is also a discussion on energy conservation and ways to conserve energy throughout the day.   Warning Signs and Symptoms Group instruction provided by PowerPoint, verbal discussion, and written material to support subject matter. Instructor reviews warning  signs and symptoms of stroke, heart attack, cold and flu. Instructor also reviews ways to prevent the spread of infection.   Other Education Group or individual verbal, written, or video instructions that support the educational goals of the pulmonary rehab program. Flowsheet Row PULMONARY REHAB OTHER RESPIRATORY from 06/29/2023 in [facility] for Heart, Vascular, & Lung Health  Date 06/29/23  Educator RT  Instruction Review Code 1- Verbalizes Understanding        Knowledge Questionnaire Score:   Core Components/Risk Factors/Patient Goals at Admission:  Personal Goals and Risk Factors at Admission - 09/18/23 0913       Core Components/Risk Factors/Patient Goals on Admission    Weight Management --    Improve shortness of breath with ADL's Yes    Intervention Provide education, individualized exercise plan and daily activity instruction to help decrease symptoms of SOB with activities of daily living.    Expected Outcomes Short Term: Improve cardiorespiratory fitness to achieve a reduction of symptoms when performing ADLs;Long Term: Be able to perform more ADLs without symptoms or delay the onset of symptoms             Core Components/Risk Factors/Patient Goals Review:   Goals and Risk Factor Review     Row Name 05/24/23 1226 06/19/23 1244 07/19/23 0917 09/20/23 0805 10/06/23 1422     Core Components/Risk Factors/Patient Goals Review   Personal Goals Review Weight Management/Obesity;Develop more efficient breathing techniques such as purse lipped breathing and diaphragmatic breathing and practicing self-pacing with activity.;Improve shortness of breath with ADL's Weight Management/Obesity;Develop more efficient breathing techniques such as purse lipped breathing and diaphragmatic breathing and practicing self-pacing with activity.;Improve shortness of breath with ADL's Weight Management/Obesity;Improve shortness of breath with ADL's Improve shortness of  breath with ADL's;Develop more efficient breathing techniques such as purse lipped breathing and diaphragmatic breathing and practicing self-pacing with activity. Improve shortness of breath with ADL's;Develop more efficient breathing techniques such as purse lipped breathing and diaphragmatic breathing and practicing self-pacing with activity.   Review Goal progressing for weight gain. Skip is working with staff dietician to obtain weight gain goal. Goal progressing for improve shortness of breath with ADL's. Goal progressing on developing more efficient breathing techniques such as purse lipped breathing and diaphragmatic breathing. Goal not progressing for weight gain.  Skip has not gained any weight since starting the program. Skip and his sister are working with our staff dietician to gain weight. He is eating a renal diet. Goal progressing for improving shortness of breath with ADL's. We have weaned Skip off his oxygen since starting the program. He still uses oxygen at night. O2 saturations have been > 88% while exercising. Goal met on developing more efficient breathing techniques such as purse lipped breathing and diaphragmatic breathing. Skip can initiate PLB on when he is short of breath while exercising. He states he has been practicing diaphragmatic breathing at home. Skip is enjoying the class and stated he can see the progress. Skip had to be discharged from the program on 07/26/23 after completing 19 sessions. Skip is currently hospitalized for bilateral pneumonia, and his plan is to go to skilled nursing after discharge. Core components/risk factors/patient goals are as follows: Goal not met for weight gain. Skip is down ~2.5# since orientation. Our dietician has spoken to both Skip and his sister for weight gain advice. Due to his recent illness, Skip had decreased appetite. Goal not met for improving shortness of breath with ADL's. We had weaned Skip off his oxygen during class, but due to his  recent pleural effusion, thoracentesis, and bilateral pneumonia he had to be placed back on oxygen. Skip is scheduled to start PR on 09/26/23. Goal progressing for improving shortness of breath with ADL's. Goal progressing for developing more efficient breathing techniques such as purse lipped breathing and diaphragmatic breathing; and practicing self-pacing with activity. Monthly review of patient's Core Components/Risk Factors/Patient Goals are as follows: Goal progressing on improving Skip's shortness of breath with ADLs. Skip has been able to wean off oxygen with exertion. He currently only wears oxygen at night. Skip stated he is able to do "a little" more for himself than previously. He can walk farther and has more stamina. Goal met for developing more efficient breathing techniques such as purse lipped breathing and diaphragmatic breathing; and practicing self-pacing activities. Skip can initiate purse lipped breathing on his own while exercising. He can correctly demonstrate diaphragmatic breathing to staff. He knows how to self-pace himself before he becomes breathless with exertion and/or exercising. Skip will continue to benefit from participation in PR for nutrition, education, exercise, and lifestyle modification.   Expected Outcomes See admission goals For Skip to gain weight, improve his shortness of breath with his ADLs & develop more efficient breathing techniques For Skip to gain weight and improve SOB with his ADL's post pulmonary rehab For Skip to improve shortness of breath with ADL's and develop more efficient breathing techniques such as purse lipped breathing and diaphragmatic breathing; and practicing self-pacing with activity. For Skip to improve shortness of breath with ADL's    Row Name 11/13/23 1007             Core Components/Risk Factors/Patient Goals Review   Personal Goals Review Improve shortness of breath with ADL's       Review Monthly review of patient's Core  Components/Risk Factors/Patient Goals are as follows: Goal progressing on improving Skip's shortness of breath with ADLs. Skip has been able to wean off oxygen with exertion. He currently only wears oxygen at night. Skip stated he is able to do "a little" more for himself than previously. He can walk farther and has more stamina. Skip will continue to benefit from participation in PR for nutrition, education, exercise, and lifestyle modification.       Expected Outcomes For Skip  to improve shortness of breath with ADL's                Core Components/Risk Factors/Patient Goals at Discharge (Final Review):   Goals and Risk Factor Review - 11/13/23 1007       Core Components/Risk Factors/Patient Goals Review   Personal Goals Review Improve shortness of breath with ADL's    Review Monthly review of patient's Core Components/Risk Factors/Patient Goals are as follows: Goal progressing on improving Skip's shortness of breath with ADLs. Skip has been able to wean off oxygen with exertion. He currently only wears oxygen at night. Skip stated he is able to do "a little" more for himself than previously. He can walk farther and has more stamina. Skip will continue to benefit from participation in PR for nutrition, education, exercise, and lifestyle modification.    Expected Outcomes For Skip to improve shortness of breath with ADL's             ITP Comments: Pt is making expected progress toward Pulmonary Rehab goals after completing 12 session(s). Recommend continued exercise, life style modification, education, and utilization of breathing techniques to increase stamina and strength, while also decreasing shortness of breath with exertion.  Dr. Mechele Collin is Medical Director for Pulmonary Rehab at [facility].

## 2023-11-16 ENCOUNTER — Encounter (HOSPITAL_COMMUNITY)
Admission: RE | Admit: 2023-11-16 | Discharge: 2023-11-16 | Disposition: A | Discharge status: Home / Self Care | Payer: Medicare Other | Source: Ambulatory Visit | Attending: Pulmonary Disease | Admitting: Pulmonary Disease

## 2023-11-16 DIAGNOSIS — J849 Interstitial pulmonary disease, unspecified: Secondary | ICD-10-CM

## 2023-11-16 NOTE — Progress Notes (Signed)
Daily Session Note  Patient Details  Name: Rickey Erickson MRN: [id number] Date of Birth: March 30, 1956 Referring Provider:   [name] Pulmonary Rehab Walk Test from 09/18/2023 in [facility] for Heart, Vascular, & Lung Health  Referring Provider [name]       Encounter Date: 11/16/2023  Check In:  Session Check In - 11/16/23 6578       Check-In   Supervising physician immediately available to respond to emergencies [facility] MD immediately available    Physician(s) Robin Searing, NP    Location [facility]    Staff Present Raford Pitcher, MS, ACSM-CEP, Exercise Physiologist;Deshawn Skelley Synthia Innocent, RN, BSN    Virtual Visit No    Medication changes reported     No    Fall or balance concerns reported    No    Tobacco Cessation No Change    Warm-up and Cool-down Performed as group-led instruction    Resistance Training Performed Yes    VAD Patient? No    PAD/SET Patient? No      Pain Assessment   Currently in Pain? No/denies    Multiple Pain Sites No             Capillary Blood Glucose: No results found for this or any previous visit (from the past 24 hours).    Social History   Tobacco Use  Smoking Status Never  Smokeless Tobacco Never    Goals Met:  Proper associated with RPD/PD & O2 Sat Independence with exercise equipment Exercise tolerated well No report of concerns or symptoms today Strength training completed today  Goals Unmet:  Not Applicable  Comments: Service time is from 0811 to 0924.    Dr. Mechele Collin is Medical Director for Pulmonary Rehab at [facility].

## 2023-11-21 ENCOUNTER — Encounter (HOSPITAL_COMMUNITY)
Admission: RE | Admit: 2023-11-21 | Discharge: 2023-11-21 | Disposition: A | Discharge status: Home / Self Care | Payer: Medicare Other | Source: Ambulatory Visit | Attending: Pulmonary Disease

## 2023-11-21 ENCOUNTER — Encounter: Payer: Self-pay | Admitting: Pulmonary Disease

## 2023-11-21 DIAGNOSIS — J849 Interstitial pulmonary disease, unspecified: Secondary | ICD-10-CM

## 2023-11-21 NOTE — Telephone Encounter (Signed)
Sleep study has been scanned in

## 2023-11-21 NOTE — Progress Notes (Signed)
Daily Session Note  Patient Details  Name: Rickey Erickson MRN: [id number] Date of Birth: [birth year] Referring Provider:   [name] Pulmonary Rehab Walk Test from 09/18/2023 in [facility] for Heart, Vascular, & Lung Health  Referring Provider [name]       Encounter Date: 11/21/2023  Check In:  Session Check In - 11/21/23 5784       Check-In   Supervising physician immediately available to respond to emergencies [facility] MD immediately available    Physician(s) Tereso Newcomer, NP    Location [facility]    Staff Present Raford Pitcher, MS, ACSM-CEP, Exercise Physiologist;Lanette Ell Synthia Innocent, RN, BSN    Virtual Visit No    Medication changes reported     No    Fall or balance concerns reported    No    Tobacco Cessation No Change    Warm-up and Cool-down Performed as group-led instruction    Resistance Training Performed Yes    VAD Patient? No    PAD/SET Patient? No      Pain Assessment   Currently in Pain? No/denies    Multiple Pain Sites No             Capillary Blood Glucose: No results found for this or any previous visit (from the past 24 hours).    Social History   Tobacco Use  Smoking Status Never  Smokeless Tobacco Never    Goals Met:  Proper associated with RPD/PD & O2 Sat Independence with exercise equipment Exercise tolerated well No report of concerns or symptoms today Strength training completed today  Goals Unmet:  Not Applicable  Comments: Service time is from 0819 to 0920.    Dr. Mechele Collin is Medical Director for Pulmonary Rehab at [facility].

## 2023-11-23 ENCOUNTER — Encounter (HOSPITAL_COMMUNITY)
Admission: RE | Admit: 2023-11-23 | Discharge: 2023-11-23 | Disposition: A | Discharge status: Home / Self Care | Payer: Medicare Other | Source: Ambulatory Visit | Attending: Pulmonary Disease | Admitting: Pulmonary Disease

## 2023-11-23 DIAGNOSIS — J849 Interstitial pulmonary disease, unspecified: Secondary | ICD-10-CM

## 2023-11-23 NOTE — Progress Notes (Signed)
Daily Session Note  Patient Details  Name: Rickey Erickson MRN: [id number] Date of Birth: Sep 20, 1956 Referring Provider:   [name] Pulmonary Rehab Walk Test from 09/18/2023 in [facility] for Heart, Vascular, & Lung Health  Referring Provider [name]       Encounter Date: 11/23/2023  Check In:  Session Check In - 11/23/23 0272       Check-In   Supervising physician immediately available to respond to emergencies [facility] MD immediately available    Physician(s) Edd Fabian, NP    Location [facility]    Staff Present Raford Pitcher, MS, ACSM-CEP, Exercise Physiologist;Lache Dagher Synthia Innocent, RN, BSN;Randi Reeve BS, ACSM-CEP, Exercise Physiologist    Virtual Visit No    Medication changes reported     No    Fall or balance concerns reported    No    Tobacco Cessation No Change    Warm-up and Cool-down Performed as group-led instruction    Resistance Training Performed Yes    VAD Patient? No    PAD/SET Patient? No      Pain Assessment   Currently in Pain? No/denies    Multiple Pain Sites No             Capillary Blood Glucose: No results found for this or any previous visit (from the past 24 hours).    Social History   Tobacco Use  Smoking Status Never  Smokeless Tobacco Never    Goals Met:  Proper associated with RPD/PD & O2 Sat Independence with exercise equipment Exercise tolerated well No report of concerns or symptoms today Strength training completed today  Goals Unmet:  Not Applicable  Comments: Service time is from 0812 to 0936.    Dr. Mechele Collin is Medical Director for Pulmonary Rehab at [facility].

## 2023-11-27 DIAGNOSIS — Z79899 Other long term (current) drug therapy: Principal | ICD-10-CM

## 2023-11-27 DIAGNOSIS — Z941 Heart transplant status: Principal | ICD-10-CM

## 2023-11-28 ENCOUNTER — Encounter (HOSPITAL_COMMUNITY)
Admission: RE | Admit: 2023-11-28 | Discharge: 2023-11-28 | Disposition: A | Discharge status: Home / Self Care | Payer: Medicare Other | Source: Ambulatory Visit | Attending: Pulmonary Disease | Admitting: Pulmonary Disease

## 2023-11-28 VITALS — Wt 121.9 lb

## 2023-11-28 DIAGNOSIS — J849 Interstitial pulmonary disease, unspecified: Secondary | ICD-10-CM | POA: Diagnosis present

## 2023-11-28 NOTE — Progress Notes (Signed)
Daily Session Note  Patient Details  Name: Rickey Erickson MRN: [id number] Date of Birth: 01/07/1956 Referring Provider:   [name] Pulmonary Rehab Walk Test from 09/18/2023 in [facility] for Heart, Vascular, & Lung Health  Referring Provider [name]       Encounter Date: 11/28/2023  Check In:  Session Check In - 11/28/23 4098       Check-In   Supervising physician immediately available to respond to emergencies [facility] MD immediately available    Physician(s) Edd Fabian, NP    Location [facility]    Staff Present Raford Pitcher, MS, ACSM-CEP, Exercise Physiologist;Howard Bunte Erin Sons BS, ACSM-CEP, Exercise Physiologist    Virtual Visit No    Medication changes reported     No    Fall or balance concerns reported    No    Tobacco Cessation No Change    Warm-up and Cool-down Performed as group-led instruction    Resistance Training Performed Yes    VAD Patient? No    PAD/SET Patient? No      Pain Assessment   Currently in Pain? No/denies    Multiple Pain Sites No             Capillary Blood Glucose: No results found for this or any previous visit (from the past 24 hours).   Exercise Prescription Changes - 11/28/23 0900       Response to Exercise   Blood Pressure (Admit) 134/78    Blood Pressure (Exercise) 156/68    Blood Pressure (Exit) 116/64    Heart Rate (Admit) 115 bpm    Heart Rate (Exercise) 124 bpm    Heart Rate (Exit) 114 bpm    Oxygen Saturation (Admit) 96 %    Oxygen Saturation (Exercise) 97 %    Oxygen Saturation (Exit) 99 %    Rating of Perceived Exertion (Exercise) 15    Perceived Dyspnea (Exercise) 3    Duration Continue with 30 min of aerobic exercise without signs/symptoms of physical distress.    Intensity THRR unchanged      Progression   Progression Continue to progress workloads to maintain intensity without signs/symptoms of physical distress.      Resistance Training   Training  Prescription Yes    Weight red bands    Reps 10-15    Time 10 Minutes      Recumbant Bike   Level 3    RPM 48    Watts 17    Minutes 15    METs 2.3      Track   Laps 5.5    Minutes 15    METs 1.82             Social History   Tobacco Use  Smoking Status Never  Smokeless Tobacco Never    Goals Met:  Proper associated with RPD/PD & O2 Sat Independence with exercise equipment Exercise tolerated well No report of concerns or symptoms today Strength training completed today  Goals Unmet:  Not Applicable  Comments: Service time is from 0803 to 0925.    Dr. Mechele Collin is Medical Director for Pulmonary Rehab at [facility]

## 2023-11-30 ENCOUNTER — Telehealth (HOSPITAL_COMMUNITY): Payer: Self-pay | Admitting: *Deleted

## 2023-11-30 ENCOUNTER — Encounter (HOSPITAL_COMMUNITY)
Admission: RE | Admit: 2023-11-30 | Discharge: 2023-11-30 | Disposition: A | Discharge status: Home / Self Care | Payer: Medicare Other | Source: Ambulatory Visit | Attending: Pulmonary Disease

## 2023-11-30 DIAGNOSIS — J849 Interstitial pulmonary disease, unspecified: Secondary | ICD-10-CM

## 2023-11-30 NOTE — Telephone Encounter (Signed)
 Pt's sister called to st that Rickey Erickson will not attend Pulmonary Rehab today due to constipation. Will cancel appt.  Rickey Erickson BS, ACSM-CEP 11/30/2023 7:54 AM

## 2023-12-05 ENCOUNTER — Encounter (HOSPITAL_COMMUNITY)
Admission: RE | Admit: 2023-12-05 | Discharge: 2023-12-05 | Disposition: A | Discharge status: Home / Self Care | Payer: Medicare Other | Source: Ambulatory Visit | Attending: Pulmonary Disease

## 2023-12-05 DIAGNOSIS — J849 Interstitial pulmonary disease, unspecified: Secondary | ICD-10-CM

## 2023-12-05 NOTE — Progress Notes (Signed)
Daily Session Note  Patient Details  Name: Rickey Erickson MRN: [id number] Date of Birth: 09/04/1956 Referring Provider:   [name] Pulmonary Rehab Walk Test from 09/18/2023 in [facility]  Referring Provider [name]       Encounter Date: 12/05/2023  Check In:  Session Check In - 12/05/23 2595       Check-In   Supervising physician immediately available to respond to emergencies [facility] MD immediately available    Physician(s) Hoover Browns, NP    Location [facility]    Staff Present Raford Pitcher, MS, ACSM-CEP, Exercise Physiologist;[place] Ganoe Erin Sons BS, ACSM-CEP, Exercise Physiologist    Virtual Visit No    Medication changes reported     No    Fall or balance concerns reported    No    Tobacco Cessation No Change    Warm-up and Cool-down Performed as group-led instruction    Resistance Training Performed Yes    VAD Patient? No    PAD/SET Patient? No      Pain Assessment   Currently in Pain? No/denies    Multiple Pain Sites No             Capillary Blood Glucose: No results found for this or any previous visit (from the past 24 hours).    Social History   Tobacco Use  Smoking Status Never  Smokeless Tobacco Never    Goals Met:  Proper associated with RPD/PD & O2 Sat Independence with exercise equipment Exercise tolerated well No report of concerns or symptoms today Strength training completed today  Goals Unmet:  Not Applicable  Comments: Service time is from 0803 to 0912.    Dr. Mechele Collin is Medical Director for Pulmonary Rehab at [facility].

## 2023-12-07 ENCOUNTER — Encounter (HOSPITAL_COMMUNITY)
Admission: RE | Admit: 2023-12-07 | Discharge: 2023-12-07 | Disposition: A | Discharge status: Home / Self Care | Payer: Medicare Other | Source: Ambulatory Visit | Attending: Pulmonary Disease | Admitting: Pulmonary Disease

## 2023-12-07 DIAGNOSIS — J849 Interstitial pulmonary disease, unspecified: Secondary | ICD-10-CM | POA: Diagnosis not present

## 2023-12-07 NOTE — Progress Notes (Signed)
Daily Session Note  Patient Details  Name: Rickey Erickson MRN: [id number] Date of Birth: 1956/06/07 Referring Provider:   [name] Pulmonary Rehab Walk Test from 09/18/2023 in [facility]  Referring Provider [name]       Encounter Date: 12/07/2023  Check In:  Session Check In - 12/07/23 0835       Check-In   Supervising physician immediately available to respond to emergencies [facility] MD immediately available    Physician(s) Rise Paganini, NP    Location [facility]    Staff Present Raford Pitcher, MS, ACSM-CEP, Exercise Physiologist;Razan Siler Synthia Innocent, RN, BSN    Virtual Visit No    Medication changes reported     No    Fall or balance concerns reported    No    Tobacco Cessation No Change    Warm-up and Cool-down Performed as group-led instruction    Resistance Training Performed Yes    VAD Patient? No    PAD/SET Patient? No      Pain Assessment   Currently in Pain? No/denies    Multiple Pain Sites No             Capillary Blood Glucose: No results found for this or any previous visit (from the past 24 hours).    Social History   Tobacco Use  Smoking Status Never  Smokeless Tobacco Never    Goals Met:  Proper associated with RPD/PD & O2 Sat Independence with exercise equipment Exercise tolerated well No report of concerns or symptoms today Strength training completed today  Goals Unmet:  Not Applicable  Comments: Service time is from 0813 to 0935.    Dr. Mechele Collin is Medical Director for Pulmonary Rehab at [facility].

## 2023-12-12 ENCOUNTER — Encounter (HOSPITAL_COMMUNITY)
Admission: RE | Admit: 2023-12-12 | Discharge: 2023-12-12 | Disposition: A | Discharge status: Home / Self Care | Payer: Medicare Other | Source: Ambulatory Visit | Attending: Pulmonary Disease

## 2023-12-12 ENCOUNTER — Telehealth (HOSPITAL_COMMUNITY): Payer: Self-pay

## 2023-12-12 VITALS — Wt 121.3 lb

## 2023-12-12 DIAGNOSIS — J849 Interstitial pulmonary disease, unspecified: Secondary | ICD-10-CM | POA: Diagnosis not present

## 2023-12-12 NOTE — Progress Notes (Signed)
Daily Session Note  Patient Details  Name: Rickey Erickson MRN: [id number] Date of Birth: October 10, 1956 Referring Provider:   [name] Pulmonary Rehab Walk Test from 09/18/2023 in [facility]  Referring Provider [name]       Encounter Date: 12/12/2023  Check In:  Session Check In - 12/12/23 4132       Check-In   Supervising physician immediately available to respond to emergencies [facility] MD immediately available    Physician(s) Carlyon Shadow, NP    Location [facility]    Staff Present Raford Pitcher, MS, ACSM-CEP, Exercise Physiologist;Ether Goebel Erin Sons BS, ACSM-CEP, Exercise Physiologist    Virtual Visit No    Medication changes reported     No    Fall or balance concerns reported    No    Tobacco Cessation No Change    Warm-up and Cool-down Performed as group-led instruction    Resistance Training Performed Yes    VAD Patient? No    PAD/SET Patient? No      Pain Assessment   Currently in Pain? No/denies    Multiple Pain Sites No             Capillary Blood Glucose: No results found for this or any previous visit (from the past 24 hours).   Exercise Prescription Changes - 12/12/23 0900       Response to Exercise   Blood Pressure (Admit) 130/68    Blood Pressure (Exercise) 168/70    Blood Pressure (Exit) 116/64    Heart Rate (Admit) 115 bpm    Heart Rate (Exercise) 126 bpm    Heart Rate (Exit) 114 bpm    Oxygen Saturation (Admit) 98 %    Oxygen Saturation (Exercise) 95 %    Oxygen Saturation (Exit) 99 %    Rating of Perceived Exertion (Exercise) 14    Perceived Dyspnea (Exercise) 2    Duration Continue with 30 min of aerobic exercise without signs/symptoms of physical distress.    Intensity THRR unchanged      Progression   Progression Continue to progress workloads to maintain intensity without signs/symptoms of physical distress.      Resistance Training   Training  Prescription Yes    Weight red bands    Reps 10-15    Time 10 Minutes      Recumbant Bike   Level 3    RPM 51    Watts 20    Minutes 15    METs 2.5      Track   Laps 5    Minutes 15    METs 1.77             Social History   Tobacco Use  Smoking Status Never  Smokeless Tobacco Never    Goals Met:  Proper associated with RPD/PD & O2 Sat Independence with exercise equipment Exercise tolerated well No report of concerns or symptoms today Strength training completed today  Goals Unmet:  Not Applicable  Comments: Service time is from 0805 to 0910.    Dr. Mechele Collin is Medical Director for Pulmonary Rehab at [facility].

## 2023-12-12 NOTE — Telephone Encounter (Signed)
LVM cancelling Pulm Rehab Class on Thursday d/t weather.

## 2023-12-13 NOTE — Progress Notes (Signed)
Pulmonary Individual Treatment Plan  Patient Details  Name: Rickey Erickson MRN: [id number] Date of Birth: Mar 02, 1956 Referring Provider:   [name] Pulmonary Rehab Walk Test from 09/18/2023 in [facility]  Referring Provider [name]       Initial Encounter Date:  Flowsheet Row Pulmonary Rehab Walk Test from 09/18/2023 in [facility]  Date 09/18/23       Visit Diagnosis: ILD (interstitial lung disease) (HCC)  Patient's Home Medications on Admission:   Current Outpatient Medications:    acetaminophen (TYLENOL) 500 MG tablet, Take 500 mg by mouth every 6 (six) hours as needed for headache (pain)., Disp: , Rfl:    albumin human 25 % bottle, Inject 100 mLs (25 g total) into the vein as needed (Give during dialysis for SBP <100.)., Disp: , Rfl:    apixaban (ELIQUIS) 2.5 MG TABS tablet, Take 2.5 mg by mouth 2 (two) times daily., Disp: , Rfl:    atorvastatin (LIPITOR) 20 MG tablet, Take 20 mg by mouth every morning., Disp: , Rfl:    CALCIUM PO, Take 1 tablet by mouth 2 (two) times daily., Disp: , Rfl:    clopidogrel (PLAVIX) 75 MG tablet, Take 75 mg by mouth every morning., Disp: , Rfl:    Darbepoetin Alfa (ARANESP) 150 MCG/0.3ML SOSY injection, Inject 0.3 mLs (150 mcg total) into the skin every Wednesday at 6 PM., Disp: , Rfl:    midodrine (PROAMATINE) 10 MG tablet, Take 10 mg by mouth See admin instructions. Take 10 mg once daily prior to dialysis on Monday, Wednesday, Friday., Disp: , Rfl:    mirtazapine (REMERON) 30 MG tablet, Take 30 mg by mouth at bedtime., Disp: , Rfl:    pantoprazole (PROTONIX) 40 MG tablet, Take 40 mg by mouth every morning., Disp: , Rfl:    tacrolimus (PROGRAF) 1 MG capsule, Take 1 mg by mouth 2 (two) times daily. Take in addition to 5 mg capsule twice daily, for a total dose of 6 mg twice daily., Disp: , Rfl:    tacrolimus (PROGRAF) 5 MG capsule,  Take 5 mg by mouth 2 (two) times daily. Take in addition to 1 mg capsule twice daily, for a total dose of 6 mg twice daily., Disp: , Rfl:   Past Medical History: Past Medical History:  Diagnosis Date   Anemia    Heart failure (HCC)    Hypertension    Osteoporosis    Renal disorder     Tobacco Use: Social History   Tobacco Use  Smoking Status Never  Smokeless Tobacco Never    Labs: Review Flowsheet  More data may exist      Latest Ref Rng & Units 01/06/2022 09/08/2022 03/07/2023 07/21/2023 07/26/2023  Labs for ITP Cardiac and Pulmonary Rehab  Cholestrol 0 - 200 mg/dL 308  657  - - -  LDL (calc) 0 - 99 mg/dL 90  80  - - -  HDL-C >84 mg/dL 30  39  - - -  Trlycerides <150 mg/dL 90  86  - - -  Hemoglobin A1c 4.8 - 5.6 % 4.9  - - - -  PH, Arterial 7.35 - 7.45 - - 7.497  7.227  7.109  7.060  -  PCO2 arterial 32 - 48 mmHg - - 33.6  56.0  85.7  107.3  -  Bicarbonate 20.0 - 28.0 mmol/L - - 26.0  23.3  27.6  25.0  27.4  30.5  23.9  25.0   TCO2 22 - 32 mmol/L - - 27  25  30  27  30   34  -  Acid-base deficit 0.0 - 2.0 mmol/L - - - 5.0  4.0  6.0  4.0  3.0  6.0  4.7   O2 Saturation % - - 96  93  30  75  86  92  98.2  77.3     Details       Multiple values from one day are sorted in reverse-chronological order         Capillary Blood Glucose: Lab Results  Component Value Date   GLUCAP 80 07/27/2023   GLUCAP 92 07/21/2023   GLUCAP 86 07/21/2023     Pulmonary Assessment Scores:  Pulmonary Assessment Scores     Row Name 09/18/23 0812         ADL UCSD   ADL Phase Entry       mMRC Score   mMRC Score 2             UCSD: Self-administered rating of dyspnea associated with activities of daily living (ADLs) 6-point scale (0 = "not at all" to 5 = "maximal or unable to do because of breathlessness")  Scoring Scores range from 0 to 120.  Minimally important difference is 5 units  CAT: CAT can identify the health impairment of COPD patients and is better correlated with  disease progression.  CAT has a scoring range of zero to 40. The CAT score is classified into four groups of low (less than 10), medium (10 - 20), high (21-30) and very high (31-40) based on the impact level of disease on health status. A CAT score over 10 suggests significant symptoms.  A worsening CAT score could be explained by an exacerbation, poor medication adherence, poor inhaler technique, or progression of COPD or comorbid conditions.  CAT MCID is 2 points  mMRC: mMRC (Modified Medical Research Council) Dyspnea Scale is used to assess the degree of baseline functional disability in patients of respiratory disease due to dyspnea. No minimal important difference is established. A decrease in score of 1 point or greater is considered a positive change.   Pulmonary Function Assessment:  Pulmonary Function Assessment - 09/18/23 0812       Breath   Bilateral Breath Sounds Decreased    Shortness of Breath Yes;Limiting activity             Exercise Target Goals: Exercise Program Goal: Individual exercise prescription set using results from initial 6 min walk test and THRR while considering  patient's activity barriers and safety.   Exercise Prescription Goal: Initial exercise prescription builds to 30-45 minutes a day of aerobic activity, 2-3 days per week.  Home exercise guidelines will be given to patient during program as part of exercise prescription that the participant will acknowledge.  Activity Barriers & Risk Stratification:   6 Minute Walk:  6 Minute Walk     Row Name 09/18/23 0906         6 Minute Walk   Phase Initial     Distance 1140 feet     Walk Time 6 minutes     # of Rest Breaks 0     MPH 2.16     METS 3.68     RPE 13     Perceived Dyspnea  2     VO2 Peak 12.87     Symptoms No     Resting HR  114 bpm     Resting BP 130/68     Resting Oxygen Saturation  95 %     Exercise Oxygen Saturation  during 6 min walk 92 %     Max Ex. HR 131 bpm     Max Ex.  BP 134/68     2 Minute Post BP 122/70       Interval HR   1 Minute HR 122     2 Minute HR 128     3 Minute HR 129     4 Minute HR 128     5 Minute HR 131     6 Minute HR 131     2 Minute Post HR 119     Interval Heart Rate? Yes       Interval Oxygen   Interval Oxygen? Yes     Baseline Oxygen Saturation % 95 %     1 Minute Oxygen Saturation % 94 %     1 Minute Liters of Oxygen 0 L     2 Minute Oxygen Saturation % 94 %     2 Minute Liters of Oxygen 0 L     3 Minute Oxygen Saturation % 92 %     3 Minute Liters of Oxygen 0 L     4 Minute Oxygen Saturation % 92 %     4 Minute Liters of Oxygen 0 L     5 Minute Oxygen Saturation % 93 %     5 Minute Liters of Oxygen 0 L     6 Minute Oxygen Saturation % 92 %     6 Minute Liters of Oxygen 0 L     2 Minute Post Oxygen Saturation % 95 %     2 Minute Post Liters of Oxygen 0 L              Oxygen Initial Assessment:  Oxygen Initial Assessment - 09/18/23 0811       Home Oxygen   Home Oxygen Device None    Sleep Oxygen Prescription CPAP    Liters per minute 2    Home Exercise Oxygen Prescription None    Home Resting Oxygen Prescription None    Compliance with Home Oxygen Use Yes      Initial 6 min Walk   Oxygen Used None      Program Oxygen Prescription   Program Oxygen Prescription None      Intervention   Short Term Goals To learn and exhibit compliance with exercise, home and travel O2 prescription;To learn and understand importance of maintaining oxygen saturations>88%;To learn and demonstrate proper use of respiratory medications;To learn and understand importance of monitoring SPO2 with pulse oximeter and demonstrate accurate use of the pulse oximeter.;To learn and demonstrate proper pursed lip breathing techniques or other breathing techniques.     Long  Term Goals Exhibits compliance with exercise, home  and travel O2 prescription;Verbalizes importance of monitoring SPO2 with pulse oximeter and return  demonstration;Exhibits proper breathing techniques, such as pursed lip breathing or other method taught during program session;Maintenance of O2 saturations>88%;Compliance with respiratory medication             Oxygen Re-Evaluation:  Oxygen Re-Evaluation     Row Name 06/19/23 1235 07/18/23 1601 10/05/23 1622 11/06/23 0857 12/04/23 0919     Program Oxygen Prescription   Program Oxygen Prescription Continuous Continuous None None None   Liters per minute 2 2 -- -- --     Home Oxygen  Home Oxygen Device E-Tanks;Home Concentrator E-Tanks;Home Concentrator None None None   Sleep Oxygen Prescription Continuous Continuous CPAP CPAP CPAP   Liters per minute 1 1 2 2 2    Home Exercise Oxygen Prescription Continuous Continuous None None None   Liters per minute 2 2 -- -- --   Home Resting Oxygen Prescription Continuous Continuous None None None   Liters per minute 2 2 -- -- --   Compliance with Home Oxygen Use Yes Yes Yes Yes Yes     Goals/Expected Outcomes   Short Term Goals To learn and exhibit compliance with exercise, home and travel O2 prescription;To learn and understand importance of maintaining oxygen saturations>88%;To learn and demonstrate proper use of respiratory medications;To learn and understand importance of monitoring SPO2 with pulse oximeter and demonstrate accurate use of the pulse oximeter.;To learn and demonstrate proper pursed lip breathing techniques or other breathing techniques.  To learn and exhibit compliance with exercise, home and travel O2 prescription;To learn and understand importance of maintaining oxygen saturations>88%;To learn and demonstrate proper use of respiratory medications;To learn and understand importance of monitoring SPO2 with pulse oximeter and demonstrate accurate use of the pulse oximeter.;To learn and demonstrate proper pursed lip breathing techniques or other breathing techniques.  To learn and exhibit compliance with exercise, home and travel O2  prescription;To learn and understand importance of maintaining oxygen saturations>88%;To learn and demonstrate proper use of respiratory medications;To learn and understand importance of monitoring SPO2 with pulse oximeter and demonstrate accurate use of the pulse oximeter.;To learn and demonstrate proper pursed lip breathing techniques or other breathing techniques.  To learn and exhibit compliance with exercise, home and travel O2 prescription;To learn and understand importance of maintaining oxygen saturations>88%;To learn and demonstrate proper use of respiratory medications;To learn and understand importance of monitoring SPO2 with pulse oximeter and demonstrate accurate use of the pulse oximeter.;To learn and demonstrate proper pursed lip breathing techniques or other breathing techniques.  To learn and exhibit compliance with exercise, home and travel O2 prescription;To learn and understand importance of maintaining oxygen saturations>88%;To learn and demonstrate proper use of respiratory medications;To learn and understand importance of monitoring SPO2 with pulse oximeter and demonstrate accurate use of the pulse oximeter.;To learn and demonstrate proper pursed lip breathing techniques or other breathing techniques.    Long  Term Goals Exhibits compliance with exercise, home  and travel O2 prescription;Verbalizes importance of monitoring SPO2 with pulse oximeter and return demonstration;Exhibits proper breathing techniques, such as pursed lip breathing or other method taught during program session;Maintenance of O2 saturations>88%;Compliance with respiratory medication Exhibits compliance with exercise, home  and travel O2 prescription;Verbalizes importance of monitoring SPO2 with pulse oximeter and return demonstration;Exhibits proper breathing techniques, such as pursed lip breathing or other method taught during program session;Maintenance of O2 saturations>88%;Compliance with respiratory medication  Exhibits compliance with exercise, home  and travel O2 prescription;Verbalizes importance of monitoring SPO2 with pulse oximeter and return demonstration;Exhibits proper breathing techniques, such as pursed lip breathing or other method taught during program session;Maintenance of O2 saturations>88%;Compliance with respiratory medication Exhibits compliance with exercise, home  and travel O2 prescription;Verbalizes importance of monitoring SPO2 with pulse oximeter and return demonstration;Exhibits proper breathing techniques, such as pursed lip breathing or other method taught during program session;Maintenance of O2 saturations>88%;Compliance with respiratory medication Exhibits compliance with exercise, home  and travel O2 prescription;Verbalizes importance of monitoring SPO2 with pulse oximeter and return demonstration;Exhibits proper breathing techniques, such as pursed lip breathing or other method taught during program session;Maintenance of O2 saturations>88%;Compliance  with respiratory medication   Goals/Expected Outcomes Compliance and understanding of oxygen saturation monitoring and breathing techniques to decrease shortness of breath. Compliance and understanding of oxygen saturation monitoring and breathing techniques to decrease shortness of breath. Compliance and understanding of oxygen saturation monitoring and breathing techniques to decrease shortness of breath. Compliance and understanding of oxygen saturation monitoring and breathing techniques to decrease shortness of breath. Compliance and understanding of oxygen saturation monitoring and breathing techniques to decrease shortness of breath.            Oxygen Discharge (Final Oxygen Re-Evaluation):  Oxygen Re-Evaluation - 12/04/23 0919       Program Oxygen Prescription   Program Oxygen Prescription None      Home Oxygen   Home Oxygen Device None    Sleep Oxygen Prescription CPAP    Liters per minute 2    Home Exercise Oxygen  Prescription None    Home Resting Oxygen Prescription None    Compliance with Home Oxygen Use Yes      Goals/Expected Outcomes   Short Term Goals To learn and exhibit compliance with exercise, home and travel O2 prescription;To learn and understand importance of maintaining oxygen saturations>88%;To learn and demonstrate proper use of respiratory medications;To learn and understand importance of monitoring SPO2 with pulse oximeter and demonstrate accurate use of the pulse oximeter.;To learn and demonstrate proper pursed lip breathing techniques or other breathing techniques.     Long  Term Goals Exhibits compliance with exercise, home  and travel O2 prescription;Verbalizes importance of monitoring SPO2 with pulse oximeter and return demonstration;Exhibits proper breathing techniques, such as pursed lip breathing or other method taught during program session;Maintenance of O2 saturations>88%;Compliance with respiratory medication    Goals/Expected Outcomes Compliance and understanding of oxygen saturation monitoring and breathing techniques to decrease shortness of breath.             Initial Exercise Prescription:  Initial Exercise Prescription - 09/18/23 0900       Date of Initial Exercise RX and Referring Provider   Date 09/18/23    Referring Provider [name]    Expected Discharge Date 12/14/23      Oxygen   Maintain Oxygen Saturation 88% or higher      Recumbant Bike   Level 1    Minutes 15    METs 2.5      Track   Minutes 15    METs 2.5      Intensity   THRR 40-80% of Max Heartrate 62-124    Ratings of Perceived Exertion 11-13    Perceived Dyspnea 0-4      Progression   Progression Continue to progress workloads to maintain intensity without signs/symptoms of physical distress.      Resistance Training   Training Prescription Yes    Weight red bands    Reps 10-15             Perform Capillary Blood Glucose checks as needed.  Exercise Prescription Changes:    Exercise Prescription Changes     Row Name 06/27/23 0900 07/06/23 0947 10/03/23 0900 10/17/23 0900 10/31/23 0900     Response to Exercise   Blood Pressure (Admit) 114/68 116/64 112/68 114/64 140/78   Blood Pressure (Exercise) 124/58 -- -- 138/70 --   Blood Pressure (Exit) 120/68 104/60 112/64 132/68 124/70   Heart Rate (Admit) 111 bpm 115 bpm 118 bpm 112 bpm 114 bpm   Heart Rate (Exercise) 124 bpm 122 bpm 131 bpm 126 bpm 131 bpm  Heart Rate (Exit) 95 bpm 109 bpm 117 bpm 115 bpm 113 bpm   Oxygen Saturation (Admit) 95 % 100 % 100 % 100 % 100 %   Oxygen Saturation (Exercise) 92 % 96 % 96 % 95 % 90 %   Oxygen Saturation (Exit) 97 % 100 % 98 % 100 % 99 %   Rating of Perceived Exertion (Exercise) 12 15 13 12 14    Perceived Dyspnea (Exercise) 1 3 3 2 3    Duration Continue with 30 min of aerobic exercise without signs/symptoms of physical distress. Continue with 30 min of aerobic exercise without signs/symptoms of physical distress. Continue with 30 min of aerobic exercise without signs/symptoms of physical distress. Continue with 30 min of aerobic exercise without signs/symptoms of physical distress. Continue with 30 min of aerobic exercise without signs/symptoms of physical distress.   Intensity THRR unchanged THRR unchanged THRR New  62-150 THRR unchanged THRR unchanged     Progression   Progression Continue to progress workloads to maintain intensity without signs/symptoms of physical distress. Continue to progress workloads to maintain intensity without signs/symptoms of physical distress. Continue to progress workloads to maintain intensity without signs/symptoms of physical distress. -- Continue to progress workloads to maintain intensity without signs/symptoms of physical distress.   Average METs -- 2.7 -- -- --     Resistance Training   Training Prescription Yes Yes Yes Yes Yes   Weight red bands red bands red bands red bands red bands   Reps 10-15 10-15 10-15 10-15 10-15   Time 10  Minutes 10 Minutes 10 Minutes 10 Minutes 10 Minutes     Oxygen   Oxygen -- Continuous -- -- --   Liters -- 2 -- -- --     Recumbant Bike   Level -- -- 2 2 3    RPM -- -- -- -- 45   Watts -- -- -- -- 21   Minutes -- -- 15 15 15    METs -- -- 2.3 2.4 2.5     NuStep   Level 3 3 -- -- --   SPM -- 71 -- -- --   Minutes 15 15 -- -- --   METs 3 2.7 -- -- --     Track   Laps 5 5.5 6.5 6.5 5   Minutes 15 15 15 15 15    METs 1.77 1.82 2 2 1.77     Oxygen   Maintain Oxygen Saturation -- 88% or higher -- -- --    Row Name 11/14/23 0900 11/28/23 0900 12/12/23 0900         Response to Exercise   Blood Pressure (Admit) 124/68 134/78 130/68     Blood Pressure (Exercise) 158/60 156/68 168/70     Blood Pressure (Exit) 118/62 116/64 116/64     Heart Rate (Admit) 114 bpm 115 bpm 115 bpm     Heart Rate (Exercise) 125 bpm 124 bpm 126 bpm     Heart Rate (Exit) 112 bpm 114 bpm 114 bpm     Oxygen Saturation (Admit) 97 % 96 % 98 %     Oxygen Saturation (Exercise) 93 % 97 % 95 %     Oxygen Saturation (Exit) 95 % 99 % 99 %     Rating of Perceived Exertion (Exercise) 15 15 14      Perceived Dyspnea (Exercise) 3 3 2      Duration Continue with 30 min of aerobic exercise without signs/symptoms of physical distress. Continue with 30 min of aerobic exercise without signs/symptoms  of physical distress. Continue with 30 min of aerobic exercise without signs/symptoms of physical distress.     Intensity THRR unchanged THRR unchanged THRR unchanged       Progression   Progression Continue to progress workloads to maintain intensity without signs/symptoms of physical distress. Continue to progress workloads to maintain intensity without signs/symptoms of physical distress. Continue to progress workloads to maintain intensity without signs/symptoms of physical distress.       Resistance Training   Training Prescription Yes Yes Yes     Weight red bands red bands red bands     Reps 10-15 10-15 10-15     Time  10 Minutes 10 Minutes 10 Minutes       Recumbant Bike   Level 2 3 3      RPM 57 48 51     Watts 15 17 20      Minutes 15 15 15      METs 2.3 2.3 2.5       Track   Laps 5.5 5.5 5     Minutes 15 15 15      METs 1.82 1.82 1.77       Oxygen   Maintain Oxygen Saturation 88% or higher -- --              Exercise Comments:   Exercise Comments     Row Name 09/26/23 0924           Exercise Comments Pt completed first day of group exercise. He exercised 15 min on the recumbent bike, level 2, METs 2.2. He then walked the track for 15 min, 2.0 METs, with seated breaks. Pt is feeling well and tolerated exercise better than last time he did pulmonary rehab. He performed warm up and cool down mostly standing, including squats. Discussed METS with good reception. Pt happy to be back.                Exercise Goals and Review:   Exercise Goals     Row Name 06/19/23 1228 09/18/23 0813           Exercise Goals   Increase Physical Activity Yes Yes      Intervention Provide advice, education, support and counseling about physical activity/exercise needs.;Develop an individualized exercise prescription for aerobic and resistive training based on initial evaluation findings, risk stratification, comorbidities and participant's personal goals. Provide advice, education, support and counseling about physical activity/exercise needs.;Develop an individualized exercise prescription for aerobic and resistive training based on initial evaluation findings, risk stratification, comorbidities and participant's personal goals.      Expected Outcomes Short Term: Attend rehab on a regular basis to increase amount of physical activity.;Long Term: Exercising regularly at least 3-5 days a week.;Long Term: Add in home exercise to make exercise part of routine and to increase amount of physical activity. Short Term: Attend rehab on a regular basis to increase amount of physical activity.;Long Term: Exercising  regularly at least 3-5 days a week.;Long Term: Add in home exercise to make exercise part of routine and to increase amount of physical activity.      Increase Strength and Stamina Yes Yes      Intervention Provide advice, education, support and counseling about physical activity/exercise needs.;Develop an individualized exercise prescription for aerobic and resistive training based on initial evaluation findings, risk stratification, comorbidities and participant's personal goals. Provide advice, education, support and counseling about physical activity/exercise needs.;Develop an individualized exercise prescription for aerobic and resistive training based on initial evaluation findings, risk  stratification, comorbidities and participant's personal goals.      Expected Outcomes Short Term: Increase workloads from initial exercise prescription for resistance, speed, and METs.;Short Term: Perform resistance training exercises routinely during rehab and add in resistance training at home;Long Term: Improve cardiorespiratory fitness, muscular endurance and strength as measured by increased METs and functional capacity ( ) Short Term: Increase workloads from initial exercise prescription for resistance, speed, and METs.;Short Term: Perform resistance training exercises routinely during rehab and add in resistance training at home;Long Term: Improve cardiorespiratory fitness, muscular endurance and strength as measured by increased METs and functional capacity ( )      Able to understand and use rate of perceived exertion (RPE) scale Yes Yes      Intervention Provide education and explanation on how to use RPE scale Provide education and explanation on how to use RPE scale      Expected Outcomes Short Term: Able to use RPE daily in rehab to express subjective intensity level;Long Term:  Able to use RPE to guide intensity level when exercising independently Short Term: Able to use RPE daily in rehab to express  subjective intensity level;Long Term:  Able to use RPE to guide intensity level when exercising independently      Able to understand and use Dyspnea scale Yes Yes      Intervention Provide education and explanation on how to use Dyspnea scale Provide education and explanation on how to use Dyspnea scale      Expected Outcomes Short Term: Able to use Dyspnea scale daily in rehab to express subjective sense of shortness of breath during exertion;Long Term: Able to use Dyspnea scale to guide intensity level when exercising independently Short Term: Able to use Dyspnea scale daily in rehab to express subjective sense of shortness of breath during exertion;Long Term: Able to use Dyspnea scale to guide intensity level when exercising independently      Knowledge and understanding of Target Heart Rate Range (THRR) Yes Yes      Intervention Provide education and explanation of THRR including how the numbers were predicted and where they are located for reference Provide education and explanation of THRR including how the numbers were predicted and where they are located for reference      Expected Outcomes Short Term: Able to state/look up THRR;Long Term: Able to use THRR to govern intensity when exercising independently;Short Term: Able to use daily as guideline for intensity in rehab Short Term: Able to state/look up THRR;Long Term: Able to use THRR to govern intensity when exercising independently;Short Term: Able to use daily as guideline for intensity in rehab      Understanding of Exercise Prescription Yes Yes      Intervention Provide education, explanation, and written materials on patient's individual exercise prescription Provide education, explanation, and written materials on patient's individual exercise prescription      Expected Outcomes Short Term: Able to explain program exercise prescription;Long Term: Able to explain home exercise prescription to exercise independently Short Term: Able to  explain program exercise prescription;Long Term: Able to explain home exercise prescription to exercise independently               Exercise Goals Re-Evaluation :  Exercise Goals Re-Evaluation     Row Name 06/19/23 1228 07/18/23 1557 07/27/23 1552 09/18/23 0813 10/05/23 1620     Exercise Goal Re-Evaluation   Exercise Goals Review Increase Physical Activity;Able to understand and use Dyspnea scale;Understanding of Exercise Prescription;Increase Strength and Stamina;Knowledge and understanding of Target Heart  Rate Range (THRR);Able to understand and use rate of perceived exertion (RPE) scale Increase Physical Activity;Able to understand and use Dyspnea scale;Understanding of Exercise Prescription;Increase Strength and Stamina;Knowledge and understanding of Target Heart Rate Range (THRR);Able to understand and use rate of perceived exertion (RPE) scale Increase Physical Activity;Able to understand and use Dyspnea scale;Understanding of Exercise Prescription;Increase Strength and Stamina;Knowledge and understanding of Target Heart Rate Range (THRR);Able to understand and use rate of perceived exertion (RPE) scale Increase Physical Activity;Able to understand and use Dyspnea scale;Understanding of Exercise Prescription;Increase Strength and Stamina;Knowledge and understanding of Target Heart Rate Range (THRR);Able to understand and use rate of perceived exertion (RPE) scale Increase Physical Activity;Able to understand and use Dyspnea scale;Understanding of Exercise Prescription;Increase Strength and Stamina;Knowledge and understanding of Target Heart Rate Range (THRR);Able to understand and use rate of perceived exertion (RPE) scale   Comments Skip has completed 13 exercise sessions. He exercises for 15 min on the Nustep and track. Pt averages 3.4 METs at level 3 on the Nustep and 2.08 METs on the track. Skip performs the warmup and cooldown standing/ seated dependent on his shortness of breath. His METs  have increased for the Nustep and have remained the same for the track. Skip has most likely reached his functional capacity. Skip sits less during the warmup and cooldown. He feels that he have improved since starting PR. Skip has completed 19 exercise sessions. He exercises for 15 min on the Nustep and track. Pt averages 2.7 METs at level 3 on the Nustep and 1.77 METs on the track. Skip performs the warmup and cooldown standing/ seated dependent on his shortness of breath. Skip's METs have remained relatively the same because he has had other health issues and had to miss class. He is motivated to exercise as he tried to exercise at his best ability today. We have discussed home exercise. Skip is trying exercise at home as often as he can. Will continue to monitor and progress as able. Skip completed 19 exercise sessions. Peak METs were 3.4 on the Nustep and 1.77 on the track. He is being discharged due to hospitalization. Skip is scheduled begin exercise again on 12/3. Skip has completed 3 exercise sessions. He exercises for 15 min on the recumbent bike and track. Skip averages 2.4 METs at level 2 on the recumbent bike and 1.92 METs on the track. He performs the warmup and cooldown mostly standing dependent on his shortness of breath. It is too soon to notate any discernable differences. Will continue to monitor and progress as able.   Expected Outcomes Through exercise at rehab and home, the patient will decrease shortness of breath with daily activities and feel confident in carrying out an exercise regimen at home. Through exercise at rehab and home, the patient will decrease shortness of breath with daily activities and feel confident in carrying out an exercise regimen at home. Through exercise at rehab and home, the patient will decrease shortness of breath with daily activities and feel confident in carrying out an exercise regimen at home. Through exercise at rehab and home, the patient will decrease  shortness of breath with daily activities and feel confident in carrying out an exercise regimen at home. Through exercise at rehab and home, the patient will decrease shortness of breath with daily activities and feel confident in carrying out an exercise regimen at home.    Row Name 11/06/23 0855 12/04/23 0916           Exercise Goal Re-Evaluation  Exercise Goals Review Increase Physical Activity;Able to understand and use Dyspnea scale;Understanding of Exercise Prescription;Increase Strength and Stamina;Knowledge and understanding of Target Heart Rate Range (THRR);Able to understand and use rate of perceived exertion (RPE) scale Increase Physical Activity;Able to understand and use Dyspnea scale;Understanding of Exercise Prescription;Increase Strength and Stamina;Knowledge and understanding of Target Heart Rate Range (THRR);Able to understand and use rate of perceived exertion (RPE) scale      Comments Skip has completed 9 exercise sessions. He exercises for 15 min on the recumbent bike and track. Skip averages 2.5 METs at level 3 on the recumbent bike and 1.77 METs on the track. He performs the warmup and cooldown mostly standing dependent on his shortness of breath. Skip has increased his workload for the recumbent bike as METs have slightly increased. His track METs have remained relatively the same. Skip also seems to do more of the warmup and cooldown standing. Will continue to monitor and progress as able. Skip has completed 16 exercise sessions. He exercises for 15 min on the recumbent bike and track. Skip averages 2.3 METs at level 3 on the recumbent bike and 2.81 METs on the track. He performs the warmup and cooldown mostly standing dependent on his shortness of breath. Skip has increased his METs for the track although METs remain about the same on the recumbent bike. I have asked Skip he needed to discuss home exercise. Skip did not wish to discuss home exercise as he feels has has all the  resources he needs to exercise outside of rehab. Will continue to monitor and progress as able.      Expected Outcomes Through exercise at rehab and home, the patient will decrease shortness of breath with daily activities and feel confident in carrying out an exercise regimen at home. Through exercise at rehab and home, the patient will decrease shortness of breath with daily activities and feel confident in carrying out an exercise regimen at home.               Discharge Exercise Prescription (Final Exercise Prescription Changes):  Exercise Prescription Changes - 12/12/23 0900       Response to Exercise   Blood Pressure (Admit) 130/68    Blood Pressure (Exercise) 168/70    Blood Pressure (Exit) 116/64    Heart Rate (Admit) 115 bpm    Heart Rate (Exercise) 126 bpm    Heart Rate (Exit) 114 bpm    Oxygen Saturation (Admit) 98 %    Oxygen Saturation (Exercise) 95 %    Oxygen Saturation (Exit) 99 %    Rating of Perceived Exertion (Exercise) 14    Perceived Dyspnea (Exercise) 2    Duration Continue with 30 min of aerobic exercise without signs/symptoms of physical distress.    Intensity THRR unchanged      Progression   Progression Continue to progress workloads to maintain intensity without signs/symptoms of physical distress.      Resistance Training   Training Prescription Yes    Weight red bands    Reps 10-15    Time 10 Minutes      Recumbant Bike   Level 3    RPM 51    Watts 20    Minutes 15    METs 2.5      Track   Laps 5    Minutes 15    METs 1.77             Nutrition:  Target Goals: Understanding of nutrition guidelines, daily intake  of sodium 1500mg , cholesterol 200mg , calories 30% from fat and 7% or less from saturated fats, daily to have 5 or more servings of fruits and vegetables.  Biometrics:  Pre Biometrics - 09/18/23 0805       Pre Biometrics   Grip Strength 22 kg              Nutrition Therapy Plan and Nutrition Goals:   Nutrition Therapy & Goals - 07/25/23 1140       Nutrition Therapy   Diet Heart Healthy Diet    Drug/Food Interactions Statins/Certain Fruits      Personal Nutrition Goals   Nutrition Goal Patient to identify strategies for weight maintenance/ weight gain of 0.5-2.0# per week.   goal in progress.   Comments Goals in progress. Skip continues regular follow-up for kidney transplant; he has required dialysis since heart transplant in 2021. He currently receives hemodialysis M/W/F. His weight has been stable since hospitalization related to pneumonia in May 2024, and per diaylsis notes, his typical weight has been ~60kg. He is down about 3.5# since orientation to our program (start weight was 61.1kg). He lives with his sister who is a great support and does all of the grocery shopping and cooking. Skip reports drinking 1 Boost Breeze daily (250kcals, 9g protein) and 1 [employer]fil[employer] Milkshake (580kcals, 13g protein) daily to aid with weight gain/weight maintenance. We have discussed multiple strategies for weight gain including increasing eating frequency, protein supplements, increasing calories from fat/protein/carbohydrates, etc. He is currently hospitalized for acute respiratory failure; will continue to monitor goals upon patient's return. Patient will benefit from participation in pulmonary rehab for nutrition, exercise, and lifestyle modification.      Intervention Plan   Intervention Prescribe, educate and counsel regarding individualized specific dietary modifications aiming towards targeted core components such as weight, hypertension, lipid management, diabetes, heart failure and other comorbidities.;Nutrition handout(s) given to patient.    Expected Outcomes Long Term Goal: Adherence to prescribed nutrition plan.;Short Term Goal: Understand basic principles of dietary content, such as calories, fat, sodium, cholesterol and nutrients.             Nutrition Assessments:  MEDIFICTS Score  Key: >=70 Need to make dietary changes  40-70 Heart Healthy Diet <= 40 Therapeutic Level Cholesterol Diet  Flowsheet Row PULMONARY REHAB OTHER RESPIRATORY from 05/02/2023 in [facility] for Heart, Vascular, & Lung Health  Picture Your Plate Total Score on Admission 62      Picture Your Plate Scores: <62 Unhealthy dietary pattern with much room for improvement. 41-50 Dietary pattern unlikely to meet recommendations for good health and room for improvement. 51-60 More healthful dietary pattern, with some room for improvement.  >60 Healthy dietary pattern, although there may be some specific behaviors that could be improved.    Nutrition Goals Re-Evaluation:  Nutrition Goals Re-Evaluation     Row Name 06/29/23 0934 07/25/23 1140           Goals   Current Weight 133 lb 9.6 oz (60.6 kg) 131 lb 2.8 oz (59.5 kg)      Comment he continues regular follow-up with [facility] healthcare s/p heart transplant. Other most recent labs GFR 11, Cr 5.5, PTH 194, triglycerides 161, HDL 34, Albumin 2.3 Cr 4.99, Phos 5.6, XBM84; other most recent labs monitored by [facility] transplant team, triglycerides 161, HDL 34,      Expected Outcome Goals in progress. Skip continues regular follow-up for kidney transplant; he has required dialysis since heart transplant in 2021.  He currently receives hemodialysis M/W/F. His weight has been stable since hospitalization related to pneumonia in May 2024, and per diaylsis notes, his typical weight has been ~60kg. He is down about 2.2# since orientation to our program (start weight was 61.1kg). He lives with his sister who is a great support and does all of the grocery shopping and cooking. Skip reports drinking 1 Boost Breeze daily (250kcals, 9g protein) and 1 Chick-fil-a Milkshake (580kcals, 13g protein) daily to aid with weight gain/weight maintenance. We have discussed multiple strategies for weight gain including increasing eating frequency, protein supplements,  increasing calories from fat/protein/carbohydrates, etc. Patient will benefit from participation in pulmonary rehab for nutrition, exercise, and lifestyle modification. Goals in progress. Skip continues regular follow-up for kidney transplant; he has required dialysis since heart transplant in 2021. He currently receives hemodialysis M/W/F. His weight has been stable since hospitalization related to pneumonia in May 2024, and per diaylsis notes, his typical weight has been ~60kg. He is down about 3.5# since orientation to our program (start weight was 61.1kg). He lives with his sister who is a great support and does all of the grocery shopping and cooking. Skip reports drinking 1 Boost Breeze daily (250kcals, 9g protein) and 1 Chick-fil-a Milkshake (580kcals, 13g protein) daily to aid with weight gain/weight maintenance. We have discussed multiple strategies for weight gain including increasing eating frequency, protein supplements, increasing calories from fat/protein/carbohydrates, etc. He is currently hospitalized for acute respiratory failure; will continue to monitor goals upon patient's return. Patient will benefit from participation in pulmonary rehab for nutrition, exercise, and lifestyle modification.               Nutrition Goals Discharge (Final Nutrition Goals Re-Evaluation):  Nutrition Goals Re-Evaluation - 07/25/23 1140       Goals   Current Weight 131 lb 2.8 oz (59.5 kg)    Comment Cr 4.99, Phos 5.6, [id number]; other most recent labs monitored by [facility] transplant team, triglycerides 161, HDL 34,    Expected Outcome Goals in progress. Skip continues regular follow-up for kidney transplant; he has required dialysis since heart transplant in 2021. He currently receives hemodialysis M/W/F. His weight has been stable since hospitalization related to pneumonia in May 2024, and per diaylsis notes, his typical weight has been ~60kg. He is down about 3.5# since orientation to our program (start weight  was 61.1kg). He lives with his sister who is a great support and does all of the grocery shopping and cooking. Skip reports drinking 1 Boost Breeze daily (250kcals, 9g protein) and 1 Chick-fil-a Milkshake (580kcals, 13g protein) daily to aid with weight gain/weight maintenance. We have discussed multiple strategies for weight gain including increasing eating frequency, protein supplements, increasing calories from fat/protein/carbohydrates, etc. He is currently hospitalized for acute respiratory failure; will continue to monitor goals upon patient's return. Patient will benefit from participation in pulmonary rehab for nutrition, exercise, and lifestyle modification.             Psychosocial: Target Goals: Acknowledge presence or absence of significant depression and/or stress, maximize coping skills, provide positive support system. Participant is able to verbalize types and ability to use techniques and skills needed for reducing stress and depression.  Initial Review & Psychosocial Screening:  Initial Psych Review & Screening - 09/18/23 0810       Initial Review   Current issues with None Identified      Family Dynamics   Good Support System? Yes    Comments sister and other family members  Barriers   Psychosocial barriers to participate in program There are no identifiable barriers or psychosocial needs.      Screening Interventions   Interventions Encouraged to exercise             Quality of Life Scores:  Scores of 19 and below usually indicate a poorer quality of life in these areas.  A difference of  2-3 points is a clinically meaningful difference.  A difference of 2-3 points in the total score of the Quality of Life Index has been associated with significant improvement in overall quality of life, self-image, physical symptoms, and general health in studies assessing change in quality of life.  PHQ-9: Review Flowsheet       09/18/2023 04/24/2023  Depression  screen PHQ 2/9  Decreased Interest 0 0  Down, Depressed, Hopeless 0 0  PHQ - 2 Score 0 0  Altered sleeping 1 0  Tired, decreased energy 0 0  Change in appetite 0 0  Feeling bad or failure about yourself  0 0  Trouble concentrating 0 0  Moving slowly or fidgety/restless 0 0  Suicidal thoughts 0 0  PHQ-9 Score 1 0  Difficult doing work/chores Not difficult at all Not difficult at all   Interpretation of Total Score  Total Score Depression Severity:  1-4 = Minimal depression, 5-9 = Mild depression, 10-14 = Moderate depression, 15-19 = Moderately severe depression, 20-27 = Severe depression   Psychosocial Evaluation and Intervention:  Psychosocial Evaluation - 09/18/23 0810       Psychosocial Evaluation & Interventions   Interventions Encouraged to exercise with the program and follow exercise prescription    Comments Skip denies any psychosocial barriers to exercise.    Expected Outcomes For Skip to participate in PR free of psychosocial barriers.    Continue Psychosocial Services  No Follow up required             Psychosocial Re-Evaluation:  Psychosocial Re-Evaluation     Row Name 06/19/23 1210 07/19/23 0913 09/20/23 0803 10/06/23 1422 11/13/23 1003     Psychosocial Re-Evaluation   Current issues with None Identified None Identified None Identified None Identified None Identified   Comments Skip is enjoying PR and denies any stressors. He states that his mental health is doing "fine". Skip states that his sister does a good job taking care of him and monitoring his overnight oxygen saturations. He is tolerating his HD sessions on T, Th, Sat well. He stated he is getting worked up for a kidney transplant. Skip stated he is looking forward to traveling and going to baseball games with his sister. Skip denies any needs at this time. Skip had to be discharged from the program on 07/26/23. His sister called [place] and stated he was hospitalized for bilateral pneumonia. He has been  transferred from [facility] to [facility]. Skip completed 19 sessions of Pulm Rehab. Skip never complained while in class, worked hard, even when he didn't feel well. Skip denied any psychosocial issues or concerns at his last re-evaluation. He has great support from his sister. Skip is scheduled to start the program on 09/26/23. No new barriers or concerns since orientation. Skip continues to deny any psy/soc needs during his monthly psychosocial evaluation. He states that his health pretty much dictates his plans for the day. He attends dialysis 3 days a week, pulmonary rehab 2 days a week and filters in MD appointments in between. Since his heart transplant he is also being worked up for a  kidney transplant. He states he has great support from his sister who drives him to and from appointments as well as serves as his primary caretaker. He denies any needs at this time. Skip has positive coping skills with a positive outlook. Skip continues to deny any psy/soc needs during his monthly psychosocial evaluation. He states that his health pretty much dictates his plans for the day. He attends dialysis 3 days a week, pulmonary rehab 2 days a week and filters in MD appointments in between. 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 He states he has great support from his sister who drives him to and from appointments as well as serves as his primary caretaker. He denies any needs at this time. Skip has positive coping skills with a positive outlook.   Expected Outcomes For Skip to attend PR without any barriers or concerns For Skip to continue to exercise for stress relief post hospitalization For Skip to participate in PR free of any psychosocial barriers or concerns. For Skip to continue in PR free of any psychosocial barriers or concerns, to use positive coping skills to manage stress. For Skip to continue in PR free of any psychosocial barriers or concerns, to use positive  coping skills to manage stress.   Interventions Encouraged to attend Pulmonary Rehabilitation for the exercise -- Encouraged to attend Pulmonary Rehabilitation for the exercise Encouraged to attend Pulmonary Rehabilitation for the exercise Encouraged to attend Pulmonary Rehabilitation for the exercise   Continue Psychosocial Services  No Follow up required No Follow up required No Follow up required No Follow up required No Follow up required    Row Name 12/06/23 1423             Psychosocial Re-Evaluation   Current issues with None Identified       Comments Skip continues to deny any psy/soc needs during his monthly psychosocial evaluation. He states he has great support from his sister who drives him to and from appointments as well as serves as his primary caretaker. He denies any needs at this time. Skip has positive coping skills with a positive outlook.       Expected Outcomes For Skip to continue in PR free of any psychosocial barriers or concerns, to use positive coping skills to manage stress.       Interventions Encouraged to attend Pulmonary Rehabilitation for the exercise       Continue Psychosocial Services  No Follow up required                Psychosocial Discharge (Final Psychosocial Re-Evaluation):  Psychosocial Re-Evaluation - 12/06/23 1423       Psychosocial Re-Evaluation   Current issues with None Identified    Comments Skip continues to deny any psy/soc needs during his monthly psychosocial evaluation. He states he has great support from his sister who drives him to and from appointments as well as serves as his primary caretaker. He denies any needs at this time. Skip has positive coping skills with a positive outlook.    Expected Outcomes For Skip to continue in PR free of any psychosocial barriers or concerns, to use positive coping skills to manage stress.    Interventions Encouraged to attend Pulmonary Rehabilitation for the exercise    Continue Psychosocial  Services  No Follow up required             Education: Education Goals: Education classes will be provided on a weekly basis,  covering required topics. Participant will state understanding/return demonstration of topics presented.  Learning Barriers/Preferences:   Education Topics: Know Your Numbers Group instruction that is supported by a PowerPoint presentation. Instructor discusses importance of knowing and understanding resting, exercise, and post-exercise oxygen saturation, heart rate, and blood pressure. Oxygen saturation, heart rate, blood pressure, rating of perceived exertion, and dyspnea are reviewed along with a normal range for these values.  Flowsheet Row PULMONARY REHAB OTHER RESPIRATORY from 10/26/2023 in [facility] for Heart, Vascular, & Lung Health  Date 10/26/23  Educator EP  Instruction Review Code 1- Verbalizes Understanding       Exercise for the Pulmonary Patient Group instruction that is supported by a PowerPoint presentation. Instructor discusses benefits of exercise, core components of exercise, frequency, duration, and intensity of an exercise routine, importance of utilizing pulse oximetry during exercise, safety while exercising, and options of places to exercise outside of rehab.  Flowsheet Row PULMONARY REHAB OTHER RESPIRATORY from 10/19/2023 in [facility] for Heart, Vascular, & Lung Health  Date 10/19/23  Educator EP  Instruction Review Code 1- Verbalizes Understanding       MET Level  Group instruction provided by PowerPoint, verbal discussion, and written material to support subject matter. Instructor reviews what METs are and how to increase METs.  Flowsheet Row PULMONARY REHAB OTHER RESPIRATORY from 06/22/2023 in [facility] for Heart, Vascular, & Lung Health  Date 06/22/23  Educator EP  Instruction Review Code 1- Verbalizes Understanding       Pulmonary  Medications Verbally interactive group education provided by instructor with focus on inhaled medications and proper administration. Flowsheet Row PULMONARY REHAB OTHER RESPIRATORY from 10/12/2023 in [facility] for Heart, Vascular, & Lung Health  Date 10/12/23       Anatomy and Physiology of the Respiratory System Group instruction provided by PowerPoint, verbal discussion, and written material to support subject matter. Instructor reviews respiratory cycle and anatomical components of the respiratory system and their functions. Instructor also reviews differences in obstructive and restrictive respiratory diseases with examples of each.  Flowsheet Row PULMONARY REHAB OTHER RESPIRATORY from 10/05/2023 in [facility] for Heart, Vascular, & Lung Health  Date 10/05/23  Educator RT  Instruction Review Code 1- Verbalizes Understanding       Oxygen Safety Group instruction provided by PowerPoint, verbal discussion, and written material to support subject matter. There is an overview of "What is Oxygen" and "Why do we need it".  Instructor also reviews how to create a safe environment for oxygen use, the importance of using oxygen as prescribed, and the risks of noncompliance. There is a brief discussion on traveling with oxygen and resources the patient may utilize. Flowsheet Row PULMONARY REHAB OTHER RESPIRATORY from 11/02/2023 in [facility] for Heart, Vascular, & Lung Health  Date 11/02/23  Educator RN  Instruction Review Code 1- Verbalizes Understanding       Oxygen Use Group instruction provided by PowerPoint, verbal discussion, and written material to discuss how supplemental oxygen is prescribed and different types of oxygen supply systems. Resources for more information are provided.  Flowsheet Row PULMONARY REHAB OTHER RESPIRATORY from 11/09/2023 in [facility] for Heart, Vascular, & Lung  Health  Date 11/09/23  Educator RT  Instruction Review Code 1- Verbalizes Understanding       Breathing Techniques Group instruction that is supported by demonstration and informational handouts. Instructor discusses the  benefits of pursed lip and diaphragmatic breathing and detailed demonstration on how to perform both.  Flowsheet Row PULMONARY REHAB OTHER RESPIRATORY from 11/16/2023 in [facility] for Heart, Vascular, & Lung Health  Date 11/16/23  Educator RN  Instruction Review Code 1- Verbalizes Understanding        Risk Factor Reduction Group instruction that is supported by a PowerPoint presentation. Instructor discusses the definition of a risk factor, different risk factors for pulmonary disease, and how the heart and lungs work together. Flowsheet Row PULMONARY REHAB OTHER RESPIRATORY from 12/07/2023 in [facility] for Heart, Vascular, & Lung Health  Date 12/07/23  Educator EP  Instruction Review Code 1- Verbalizes Understanding       Pulmonary Diseases Group instruction provided by PowerPoint, verbal discussion, and written material to support subject matter. Instructor gives an overview of the different type of pulmonary diseases. There is also a discussion on risk factors and symptoms as well as ways to manage the diseases. Flowsheet Row PULMONARY REHAB OTHER RESPIRATORY from 09/28/2023 in [facility] for Heart, Vascular, & Lung Health  Date 09/28/23  Educator RT  Instruction Review Code 1- Verbalizes Understanding       Stress and Energy Conservation Group instruction provided by PowerPoint, verbal discussion, and written material to support subject matter. Instructor gives an overview of stress and the impact it can have on the body. Instructor also reviews ways to reduce stress. There is also a discussion on energy conservation and ways to conserve energy throughout the day. Flowsheet Row  PULMONARY REHAB OTHER RESPIRATORY from 11/23/2023 in [facility] for Heart, Vascular, & Lung Health  Date 11/23/23  Educator RN  Instruction Review Code 1- Verbalizes Understanding       Warning Signs and Symptoms Group instruction provided by PowerPoint, verbal discussion, and written material to support subject matter. Instructor reviews warning signs and symptoms of stroke, heart attack, cold and flu. Instructor also reviews ways to prevent the spread of infection.   Other Education Group or individual verbal, written, or video instructions that support the educational goals of the pulmonary rehab program. Flowsheet Row PULMONARY REHAB OTHER RESPIRATORY from 06/29/2023 in [facility] for Heart, Vascular, & Lung Health  Date 06/29/23  Educator RT  Instruction Review Code 1- Verbalizes Understanding        Knowledge Questionnaire Score:   Core Components/Risk Factors/Patient Goals at Admission:  Personal Goals and Risk Factors at Admission - 09/18/23 0913       Core Components/Risk Factors/Patient Goals on Admission    Weight Management --    Improve shortness of breath with ADL's Yes    Intervention Provide education, individualized exercise plan and daily activity instruction to help decrease symptoms of SOB with activities of daily living.    Expected Outcomes Short Term: Improve cardiorespiratory fitness to achieve a reduction of symptoms when performing ADLs;Long Term: Be able to perform more ADLs without symptoms or delay the onset of symptoms             Core Components/Risk Factors/Patient Goals Review:   Goals and Risk Factor Review     Row Name 06/19/23 1244 07/19/23 0917 09/20/23 0805 10/06/23 1422 11/13/23 1007     Core Components/Risk Factors/Patient Goals Review   Personal Goals Review Weight Management/Obesity;Develop more efficient breathing techniques such as purse lipped breathing and diaphragmatic  breathing and practicing self-pacing with activity.;Improve shortness of breath with ADL's Weight  Management/Obesity;Improve shortness of breath with ADL's Improve shortness of breath with ADL's;Develop more efficient breathing techniques such as purse lipped breathing and diaphragmatic breathing and practicing self-pacing with activity. Improve shortness of breath with ADL's;Develop more efficient breathing techniques such as purse lipped breathing and diaphragmatic breathing and practicing self-pacing with activity. Improve shortness of breath with ADL's   Review Goal not progressing for weight gain. Skip has not gained any weight since starting the program. Skip and his sister are working with our staff dietician to gain weight. He is eating a renal diet. Goal progressing for improving shortness of breath with ADL's. We have weaned Skip off his oxygen since starting the program. He still uses oxygen at night. O2 saturations have been > 88% while exercising. Goal met on developing more efficient breathing techniques such as purse lipped breathing and diaphragmatic breathing. Skip can initiate PLB on when he is short of breath while exercising. He states he has been practicing diaphragmatic breathing at home. Skip is enjoying the class and stated he can see the progress. Skip had to be discharged from the program on 07/26/23 after completing 19 sessions. Skip is currently hospitalized for bilateral pneumonia, and his plan is to go to skilled nursing after discharge. Core components/risk factors/patient goals are as follows: Goal not met for weight gain. Skip is down ~2.5# since orientation. Our dietician has spoken to both Skip and his sister for weight gain advice. Due to his recent illness, Skip had decreased appetite. Goal not met for improving shortness of breath with ADL's. We had weaned Skip off his oxygen during class, but due to his recent pleural effusion, thoracentesis, and bilateral pneumonia he had to  be placed back on oxygen. Skip is scheduled to start PR on 09/26/23. Goal progressing for improving shortness of breath with ADL's. Goal progressing for developing more efficient breathing techniques such as purse lipped breathing and diaphragmatic breathing; and practicing self-pacing with activity. Monthly review of patient's Core Components/Risk Factors/Patient Goals are as follows: Goal progressing on improving Skip's shortness of breath with ADLs. Skip has been able to wean off oxygen with exertion. He currently only wears oxygen at night. Skip stated he is able to do "a little" more for himself than previously. He can walk farther and has more stamina. Goal met for developing more efficient breathing techniques such as purse lipped breathing and diaphragmatic breathing; and practicing self-pacing activities. Skip can initiate purse lipped breathing on his own while exercising. He can correctly demonstrate diaphragmatic breathing to staff. He knows how to self-pace himself before he becomes breathless with exertion and/or exercising. Skip will continue to benefit from participation in PR for nutrition, education, exercise, and lifestyle modification. Monthly review of patient's Core Components/Risk Factors/Patient Goals are as follows: Goal progressing on improving Skip's shortness of breath with ADLs. Skip has been able to wean off oxygen with exertion. He currently only wears oxygen at night. Skip stated he is able to do "a little" more for himself than previously. He can walk farther and has more stamina. Skip will continue to benefit from participation in PR for nutrition, education, exercise, and lifestyle modification.   Expected Outcomes For Skip to gain weight, improve his shortness of breath with his ADLs & develop more efficient breathing techniques For Skip to gain weight and improve SOB with his ADL's post pulmonary rehab For Skip to improve shortness of breath with ADL's and develop more efficient  breathing techniques such as purse lipped breathing and diaphragmatic breathing; and practicing  self-pacing with activity. For Skip to improve shortness of breath with ADL's For Skip to improve shortness of breath with ADL's    Row Name 12/06/23 1426             Core Components/Risk Factors/Patient Goals Review   Personal Goals Review Improve shortness of breath with ADL's       Review Monthly review of patient's Core Components/Risk Factors/Patient Goals are as follows: Goal progressing on improving Skip's shortness of breath with ADLs. Skip has been able to wean off oxygen with exertion. He currently only wears oxygen at night. Skip stated he is able to do "a little" more for himself than previously. He can walk farther and has more stamina. Skip will continue to benefit from participation in PR for nutrition, education, exercise, and lifestyle modification.       Expected Outcomes For Skip to improve shortness of breath with ADL's                Core Components/Risk Factors/Patient Goals at Discharge (Final Review):   Goals and Risk Factor Review - 12/06/23 1426       Core Components/Risk Factors/Patient Goals Review   Personal Goals Review Improve shortness of breath with ADL's    Review Monthly review of patient's Core Components/Risk Factors/Patient Goals are as follows: Goal progressing on improving Skip's shortness of breath with ADLs. Skip has been able to wean off oxygen with exertion. He currently only wears oxygen at night. Skip stated he is able to do "a little" more for himself than previously. He can walk farther and has more stamina. Skip will continue to benefit from participation in PR for nutrition, education, exercise, and lifestyle modification.    Expected Outcomes For Skip to improve shortness of breath with ADL's             ITP Comments: Pt is making expected progress toward Pulmonary Rehab goals after completing 19 session(s). Recommend continued exercise,  life style modification, education, and utilization of breathing techniques to increase stamina and strength, while also decreasing shortness of breath with exertion.  Dr. Mechele Collin is Medical Director for Pulmonary Rehab at [facility].

## 2023-12-14 ENCOUNTER — Encounter (HOSPITAL_COMMUNITY): Payer: Medicare Other

## 2023-12-14 ENCOUNTER — Encounter (HOSPITAL_COMMUNITY)
Admission: RE | Admit: 2023-12-14 | Discharge: 2023-12-14 | Disposition: A | Discharge status: Home / Self Care | Payer: Medicare Other | Source: Ambulatory Visit | Attending: Pulmonary Disease

## 2023-12-14 DIAGNOSIS — J849 Interstitial pulmonary disease, unspecified: Secondary | ICD-10-CM

## 2023-12-14 NOTE — Progress Notes (Signed)
Daily Session Note  Patient Details  Name: Rickey Erickson MRN: [id number] Date of Birth: 10/09/1956 Referring Provider:   [name] Pulmonary Rehab Walk Test from 09/18/2023 in [facility] for Heart, Vascular, & Lung Health  Referring Provider [name]       Encounter Date: 12/14/2023  Check In:  Session Check In - 12/14/23 1143       Check-In   Supervising physician immediately available to respond to emergencies [facility] MD immediately available    Physician(s) Joni Reining, NP    Location [facility]    Staff Present Raford Pitcher, MS, ACSM-CEP, Exercise Physiologist;Kattia Selley Erin Sons BS, ACSM-CEP, Exercise Physiologist;Mary Gerre Scull, RN, BSN    Virtual Visit No    Medication changes reported     No    Fall or balance concerns reported    No    Tobacco Cessation No Change    Warm-up and Cool-down Performed as group-led instruction    Resistance Training Performed Yes    VAD Patient? No    PAD/SET Patient? No      Pain Assessment   Currently in Pain? No/denies    Multiple Pain Sites No             Capillary Blood Glucose: No results found for this or any previous visit (from the past 24 hours).    Social History   Tobacco Use  Smoking Status Never  Smokeless Tobacco Never    Goals Met:  Proper associated with RPD/PD & O2 Sat Independence with exercise equipment Exercise tolerated well No report of concerns or symptoms today Strength training completed today  Goals Unmet:  Not Applicable  Comments: Service time is from 1011 to 1128.    Dr. Mechele Collin is Medical Director for Pulmonary Rehab at [facility].

## 2023-12-19 ENCOUNTER — Encounter (HOSPITAL_COMMUNITY)
Admission: RE | Admit: 2023-12-19 | Discharge: 2023-12-19 | Disposition: A | Discharge status: Home / Self Care | Payer: Medicare Other | Source: Ambulatory Visit | Attending: Pulmonary Disease

## 2023-12-19 VITALS — Wt 121.3 lb

## 2023-12-19 DIAGNOSIS — J849 Interstitial pulmonary disease, unspecified: Secondary | ICD-10-CM

## 2023-12-19 NOTE — Progress Notes (Signed)
 Daily Session Note  Patient Details  Name: Rickey Erickson MRN: [id number] Date of Birth: 01/02/1956 Referring Provider:   [name] Pulmonary Rehab Walk Test from 09/18/2023 in [facility] for Heart, Vascular, & Lung Health  Referring Provider [name]       Encounter Date: 12/19/2023  Check In:  Session Check In - 12/19/23 3244       Check-In   Supervising physician immediately available to respond to emergencies [facility] MD immediately available    Physician(s) Robin Searing, NP    Location [facility]    Staff Present Raford Pitcher, MS, ACSM-CEP, Exercise Physiologist;Courtenay Creger Katrinka Blazing, RT    Virtual Visit No    Medication changes reported     No    Fall or balance concerns reported    No    Tobacco Cessation No Change    Warm-up and Cool-down Performed as group-led instruction    Resistance Training Performed Yes    VAD Patient? No    PAD/SET Patient? No      Pain Assessment   Currently in Pain? No/denies    Multiple Pain Sites No             Capillary Blood Glucose: No results found for this or any previous visit (from the past 24 hours).    Social History   Tobacco Use  Smoking Status Never  Smokeless Tobacco Never    Goals Met:  Proper associated with RPD/PD & O2 Sat Independence with exercise equipment Exercise tolerated well No report of concerns or symptoms today Strength training completed today  Goals Unmet:  Not Applicable  Comments: Service time is from 0800 to 0930.    Dr. Mechele Collin is Medical Director for Pulmonary Rehab at [facility].

## 2023-12-21 ENCOUNTER — Encounter (HOSPITAL_COMMUNITY): Admission: RE | Admit: 2023-12-21 | Payer: Medicare Other | Source: Ambulatory Visit

## 2023-12-21 ENCOUNTER — Encounter: Payer: Self-pay | Admitting: Pulmonary Disease

## 2023-12-21 ENCOUNTER — Encounter (HOSPITAL_COMMUNITY): Payer: Self-pay

## 2023-12-21 ENCOUNTER — Ambulatory Visit (INDEPENDENT_AMBULATORY_CARE_PROVIDER_SITE_OTHER): Payer: Medicare Other

## 2023-12-21 ENCOUNTER — Ambulatory Visit: Payer: Medicare Other | Admitting: Pulmonary Disease

## 2023-12-21 ENCOUNTER — Ambulatory Visit: Admit: 2023-12-21 | Discharge: 2023-12-26 | Disposition: A | Discharge status: Home / Self Care | Payer: MEDICARE

## 2023-12-21 ENCOUNTER — Inpatient Hospital Stay: Admit: 2023-12-21 | Discharge: 2023-12-26 | Disposition: A | Discharge status: Home / Self Care | Payer: MEDICARE

## 2023-12-21 VITALS — BP 134/70 | HR 103 | Temp 98.1°F | Ht 68.0 in | Wt 121.0 lb

## 2023-12-21 DIAGNOSIS — R0609 Other forms of dyspnea: Secondary | ICD-10-CM

## 2023-12-21 NOTE — Progress Notes (Signed)
 Pt called and stated he would not be able to make it to Pulmonary rehab class today due to his oxygen saturations being low.

## 2023-12-21 NOTE — Progress Notes (Signed)
 @Patient  ID: Rickey Erickson, male    DOB: 10-04-1956, 68 y.o.   MRN: [id number]  Chief Complaint  Patient presents with   Follow-up    Shortness of breath. Patient uses oxygen during the night 2L    Referring provider: No ref. provider found  HPI:   68 y.o. male with past medical history significant for coronary artery disease status post heart transplantation and ESRD on dialysis whom are seen for evaluation of chronic hypercapnic respiratory failure currently using NIPPV via ventilator with facemask at home after recent hospitalization.    Returns for follow-up.  Routine and plan follow-up.  He notes 2 days ago respiratory therapist noted decrease in oxygen saturation with exertion of pulmonary rehab. Usually does not need oxygen.  He feels more short of breath.  Little heaviness in his chest.  He denies any cough fever otherwise.  No infectious symptoms.  He wore a finger probe to sleep and noted several oxygen desaturations not lasting too long but on a few minutes at night.  This is on AVAPS via trilogy with 2 L bleed in.  This is unusual as far as I can tell and per his report.  We discussed at length possible etiologies, most likely volume overload given lack of infectious symptoms of history of the same.  On exam he sounded as if he had bilateral pleural effusions mild to moderate in size based on exam.  Chest x-ray confirms small to moderate pleural effusions.  Given his decreased oxygen and come up on the weekend, discussed going to [facility] for ED evaluation.  Discussed with transplant coordinator, they agreed.  He was going home to get supplies, close and hip to the ED in [facility].  Notably in the interim his CPAP patient study is resulted.  Recommended auto titrating 5 to 15 cm of water versus 9 cm of water that he was titrated to during the study.   HPI initial visit: Noticed increasing respiratory symptoms of late September 2024.  Oxygen saturation is not quite as good.   More dyspneic.  Not feeling well.  Present to the ED.  Diagnosed with pneumonia.  Pleural effusion was present.  Loculated.  This led to chest tube placement.  Persisted.  Intrapleural lytics were administered.  Was not really improving adequately, not quickly enough per family member, and patient.  Requested transfer [facility], transplant center.  They are additional chest tubes were placed.  Got normal dialysis sessions.  No increased sessions.  Unclear if additional ultrafiltrate was removed.  Took a while but gradually improved.  He was noted at coming in at [facility] to be persistently hypercarbic.  Even despite NIPPV via traditional BiPAP and CPAP.  This prompted discharge home with ventilator in backup mode.  He is using.  Anytime he sleeps.  At night and with naps.  We discussed continuing the same.  He needs ongoing care to help manage this issue as well with equipment etc.  As well as a local contact for respiratory complaints or shortness of breath.   Questionaires / Pulmonary Flowsheets:   ACT:      No data to display          MMRC:     No data to display          Epworth:      No data to display          Tests:   FENO:  No results found for: "NITRICOXIDE"  PFT:  No data to display          WALK:     09/18/2023    9:06 AM 04/24/2023   11:12 AM  SIX MIN WALK  2 Minute Oxygen Saturation % 94 % 97 %  2 Minute HR 128 118  4 Minute Oxygen Saturation % 92 % 97 %  4 Minute HR 128 123  6 Minute Oxygen Saturation % 92 % 96 %  6 Minute HR 131 124    Imaging: Personally reviewed and as per EMR and discussion in this note DG Chest 2 View Result Date: 12/21/2023 CLINICAL DATA:  Dyspnea on exertion. EXAM: CHEST - 2 VIEW COMPARISON:  X-ray 07/27/2023 and older FINDINGS: Sternal wires. Stable cardiopericardial silhouette. Right IJ large-bore catheter in place with tip overlying the SVC right atrial junction region. Bilateral small pleural effusions identified with adjacent  opacities. No pneumothorax. Chronic lung changes are seen. Mild degenerative changes along the spine. Apical pleural thickening. IMPRESSION: Postop chest.  Right IJ catheter. Bilateral small pleural effusions and adjacent opacities. Similar to previous Electronically Signed   By: Karen Kays M.D.   On: 12/21/2023 14:56    Lab Results: Personally reviewed CBC    Component Value Date/Time   WBC 4.9 07/27/2023 1309   RBC 3.51 (L) 07/27/2023 1309   HGB 9.9 (L) 07/27/2023 1309   HCT 34.6 (L) 07/27/2023 1309   PLT 147 (L) 07/27/2023 1309   MCV 98.6 07/27/2023 1309   MCH 28.2 07/27/2023 1309   MCHC 28.6 (L) 07/27/2023 1309   RDW 14.6 07/27/2023 1309   LYMPHSABS 0.4 (L) 07/21/2023 0445   MONOABS 0.9 07/21/2023 0445   EOSABS 0.1 07/21/2023 0445   BASOSABS 0.0 07/21/2023 0445    BMET    Component Value Date/Time   NA 133 (L) 07/27/2023 0927   K 4.3 07/27/2023 0927   CL 94 (L) 07/27/2023 0927   CO2 25 07/27/2023 0927   GLUCOSE 65 (L) 07/27/2023 0927   BUN 22 07/27/2023 0927   CREATININE 3.92 (H) 07/27/2023 0927   CALCIUM 7.0 (L) 07/27/2023 0927   GFRNONAA 16 (L) 07/27/2023 0927   GFRAA >60 06/18/2018 1005    BNP    Component Value Date/Time   BNP 496.6 (H) 03/07/2023 1628    ProBNP No results found for: "PROBNP"  Specialty Problems       Pulmonary Problems   Acute respiratory failure with hypoxia (HCC)   Pleural effusion on right   Community acquired pneumonia of right lower lobe of lung   Snoring   Respiratory failure (HCC)    Allergies  Allergen Reactions   Transderm-Scop [Scopolamine] Other (See Comments)    Hallucinations Induces severe delirium    Nsaids Other (See Comments)    Kidney impact   Seroquel [Quetiapine] Other (See Comments)    akathesia   Ativan [Lorazepam] Other (See Comments)    Hallucinations Severe somnolence/delirium     Immunization History  Administered Date(s) Administered   PFIZER(Purple Top)SARS-COV-2 Vaccination 01/09/2020,  01/30/2020, 07/28/2020, 12/29/2020   Pfizer Covid-19 Vaccine Bivalent Booster 67yrs & up 08/31/2021   Pfizer(Comirnaty)Fall Seasonal Vaccine 12 years and older 07/28/2022, 08/22/2023    Past Medical History:  Diagnosis Date   Anemia    Heart failure (HCC)    Hypertension    Osteoporosis    Renal disorder     Tobacco History: Social History   Tobacco Use  Smoking Status Never  Smokeless Tobacco Never   Counseling given: Not Answered   Continue  to not smoke  Outpatient Encounter Medications as of 12/21/2023  Medication Sig   acetaminophen (TYLENOL) 500 MG tablet Take 500 mg by mouth every 6 (six) hours as needed for headache (pain).   albumin human 25 % bottle Inject 100 mLs (25 g total) into the vein as needed (Give during dialysis for SBP <100.).   apixaban (ELIQUIS) 2.5 MG TABS tablet Take 2.5 mg by mouth 2 (two) times daily.   atorvastatin (LIPITOR) 20 MG tablet Take 20 mg by mouth every morning.   CALCIUM PO Take 1 tablet by mouth 2 (two) times daily.   Darbepoetin Alfa (ARANESP) 150 MCG/0.3ML SOSY injection Inject 0.3 mLs (150 mcg total) into the skin every Wednesday at 6 PM.   midodrine (PROAMATINE) 10 MG tablet Take 10 mg by mouth See admin instructions. Take 10 mg once daily prior to dialysis on Monday, Wednesday, Friday.   mirtazapine (REMERON) 30 MG tablet Take 30 mg by mouth at bedtime.   pantoprazole (PROTONIX) 40 MG tablet Take 40 mg by mouth every morning.   tacrolimus (PROGRAF) 1 MG capsule Take 1 mg by mouth 2 (two) times daily. Take in addition to 5 mg capsule twice daily, for a total dose of 6 mg twice daily.   tacrolimus (PROGRAF) 5 MG capsule Take 5 mg by mouth 2 (two) times daily. Take in addition to 1 mg capsule twice daily, for a total dose of 6 mg twice daily.   clopidogrel (PLAVIX) 75 MG tablet Take 75 mg by mouth every morning.   No facility-administered encounter medications on file as of 12/21/2023.     Review of Systems  Review of Systems  No  chest pain with exertion.  No orthopnea or PND.  Comprehensive review of systems otherwise negative. Physical Exam  BP 134/70 (BP Location: Left Arm, Patient Position: Sitting, Cuff Size: Normal)   Pulse (!) 103   Temp 98.1 F (36.7 C) (Oral)   Ht 5\' 8"  (1.727 m)   Wt 121 lb (54.9 kg)   SpO2 93%   BMI 18.40 kg/m   Wt Readings from Last 5 Encounters:  12/21/23 121 lb (54.9 kg)  12/12/23 121 lb 4.1 oz (55 kg)  11/28/23 121 lb 14.6 oz (55.3 kg)  11/14/23 121 lb 11.1 oz (55.2 kg)  10/30/23 121 lb (54.9 kg)    BMI Readings from Last 5 Encounters:  12/21/23 18.40 kg/m  12/12/23 18.44 kg/m  11/28/23 18.54 kg/m  11/14/23 18.50 kg/m  10/30/23 18.40 kg/m     Physical Exam General: Sitting in chair, no acute distress Eyes: EOMI, no icterus Neck: Supple, no JVP Pulmonary: Clear, good air excursion, normal work of breathing Cardiovascular: Warm, no edema Abdomen: Nondistended bowel sounds present MSK: No synovitis, no joint effusion Neuro: Normal gait, no weakness Psych: Normal mood, full affect   Assessment & Plan:   Chronic nocturnal hypoxemia and hypoxic respiratory failure: On AVAPS at night due to significant hypercarbia documented on multiple blood gases.  Suspect hypoxemia largely due to intermittent fluid overload.  Areas of bronchiectasis on imaging as well.  Continue AVAPS, current setting.  2 L bleed in.  Worsen hypoxia over last couple days, worsening shortness of breath, associated with bilateral pleural effusions, vascular congestion with worsening cephalization of frank pulmonary edema on my review and interpretation on chest x-ray.  Going to the ED in [facility] for further evaluation.  May need more fluid removal via dialysis etc.  Denies infectious symptoms which is reassuring but certainly at risk  given his immunosuppression from heart transplantation.  Discussed on phone with transplant coordinator at [facility].  Shortness of breath/dyspnea on exertion: Largely  exacerbated by fluid accumulation with inability to regulate fluid retention as ESRD on HD.  Serial images in the past demonstrate signs of pulmonary venous hypertension and volume overload pleural effusions edema etc. a bit worse today on chest x-ray.  Going to ED as above.   No follow-ups on file.   Karren Burly, MD 12/21/2023   This appointment required 45 minutes of patient care (this includes precharting, chart review, review of results, face-to-face care, and etc.).

## 2023-12-25 DIAGNOSIS — Z941 Heart transplant status: Principal | ICD-10-CM

## 2023-12-25 DIAGNOSIS — Z79899 Other long term (current) drug therapy: Principal | ICD-10-CM

## 2023-12-26 ENCOUNTER — Encounter (HOSPITAL_COMMUNITY)
Admission: RE | Admit: 2023-12-26 | Disposition: A | Discharge status: Home / Self Care | Payer: Medicare Other | Source: Ambulatory Visit | Attending: Pulmonary Disease

## 2023-12-26 ENCOUNTER — Encounter (HOSPITAL_COMMUNITY): Payer: Self-pay

## 2023-12-26 ENCOUNTER — Telehealth (HOSPITAL_COMMUNITY): Payer: Self-pay | Admitting: *Deleted

## 2023-12-26 NOTE — Telephone Encounter (Signed)
 Rickey Erickson called to state he is in the hospital and will miss PR today.  Ethelda Chick BS, ACSM-CEP 12/26/2023 7:54 AM

## 2023-12-28 ENCOUNTER — Encounter (HOSPITAL_COMMUNITY)
Admission: RE | Admit: 2023-12-28 | Discharge: 2023-12-28 | Disposition: A | Discharge status: Home / Self Care | Payer: Medicare Other | Source: Ambulatory Visit | Attending: Pulmonary Disease | Admitting: Pulmonary Disease

## 2023-12-28 DIAGNOSIS — J849 Interstitial pulmonary disease, unspecified: Secondary | ICD-10-CM | POA: Diagnosis present

## 2023-12-28 NOTE — Progress Notes (Signed)
 Daily Session Note  Patient Details  Name: Rickey Erickson MRN: [id number] Date of Birth: 11-20-1955 Referring Provider:   [name] Pulmonary Rehab Walk Test from 09/18/2023 in [facility]  Referring Provider [name]       Encounter Date: 12/28/2023  Check In:  Session Check In - 12/28/23 0841       Check-In   Supervising physician immediately available to respond to emergencies [facility] MD immediately available    Physician(s) Robin Searing, NP    Location [facility]    Staff Present Raford Pitcher, MS, ACSM-CEP, Exercise Physiologist;Tatum Corl Erin Sons BS, ACSM-CEP, Exercise Physiologist;Mary Gerre Scull, RN, BSN    Virtual Visit No    Medication changes reported     No    Fall or balance concerns reported    No    Tobacco Cessation No Change    Warm-up and Cool-down Performed as group-led instruction    Resistance Training Performed Yes    VAD Patient? No    PAD/SET Patient? No      Pain Assessment   Currently in Pain? No/denies             Capillary Blood Glucose: No results found for this or any previous visit (from the past 24 hours).    Social History   Tobacco Use  Smoking Status Never  Smokeless Tobacco Never    Goals Met:  Proper associated with RPD/PD & O2 Sat Independence with exercise equipment Exercise tolerated well No report of concerns or symptoms today Strength training completed today  Goals Unmet:  Not Applicable  Comments: Service time is from 0819 to [phone].    Dr. Mechele Collin is Medical Director for Pulmonary Rehab at [facility].

## 2024-01-02 ENCOUNTER — Encounter (HOSPITAL_COMMUNITY)
Admission: RE | Admit: 2024-01-02 | Discharge: 2024-01-02 | Disposition: A | Discharge status: Home / Self Care | Source: Ambulatory Visit | Attending: Pulmonary Disease

## 2024-01-02 DIAGNOSIS — J849 Interstitial pulmonary disease, unspecified: Secondary | ICD-10-CM

## 2024-01-02 NOTE — Progress Notes (Signed)
 Daily Session Note  Patient Details  Name: Rickey Erickson MRN: [id number] Date of Birth: Mar 09, 1956 Referring Provider:   [name] Pulmonary Rehab Walk Test from 09/18/2023 in [facility]  Referring Provider [name]       Encounter Date: 01/02/2024  Check In:  Session Check In - 01/02/24 0942       Check-In   Supervising physician immediately available to respond to emergencies [facility] MD immediately available    Physician(s) Tereso Newcomer, PA    Location [facility]    Staff Present Raford Pitcher, MS, ACSM-CEP, Exercise Physiologist;Emil Weigold Erin Sons BS, ACSM-CEP, Exercise Physiologist    Virtual Visit No    Medication changes reported     No    Fall or balance concerns reported    No    Tobacco Cessation No Change    Warm-up and Cool-down Performed as group-led instruction    Resistance Training Performed Yes    VAD Patient? No    PAD/SET Patient? No      Pain Assessment   Currently in Pain? No/denies    Multiple Pain Sites No             Capillary Blood Glucose: No results found for this or any previous visit (from the past 24 hours).    Social History   Tobacco Use  Smoking Status Never  Smokeless Tobacco Never    Goals Met:  Proper associated with RPD/PD & O2 Sat Independence with exercise equipment Exercise tolerated well No report of concerns or symptoms today Strength training completed today  Goals Unmet:  Not Applicable  Comments: Service time is from 0806 to 0928.    Dr. Mechele Collin is Medical Director for Pulmonary Rehab at [facility].

## 2024-01-04 ENCOUNTER — Encounter (HOSPITAL_COMMUNITY)
Admission: RE | Admit: 2024-01-04 | Discharge: 2024-01-04 | Discharge status: Home / Self Care | Source: Ambulatory Visit | Attending: Pulmonary Disease

## 2024-01-04 DIAGNOSIS — J849 Interstitial pulmonary disease, unspecified: Secondary | ICD-10-CM

## 2024-01-04 NOTE — Progress Notes (Signed)
 Daily Session Note  Patient Details  Name: Rickey Erickson MRN: [id number] Date of Birth: 04/08/1956 Referring Provider:   [name] Pulmonary Rehab Walk Test from 09/18/2023 in [facility]  Referring Provider [name]       Encounter Date: 01/04/2024  Check In:  Session Check In - 01/04/24 0854       Check-In   Supervising physician immediately available to respond to emergencies [facility] MD immediately available    Physician(s) Oris Drone, NP    Location [facility]    Staff Present Raford Pitcher, MS, ACSM-CEP, Exercise Physiologist;Casey Erin Sons BS, ACSM-CEP, Exercise Physiologist;Samantha Belarus, RD, LDN    Virtual Visit No    Medication changes reported     No    Fall or balance concerns reported    No    Tobacco Cessation No Change    Warm-up and Cool-down Performed as group-led instruction    Resistance Training Performed Yes    VAD Patient? No    PAD/SET Patient? No      Pain Assessment   Currently in Pain? No/denies    Multiple Pain Sites No             Capillary Blood Glucose: No results found for this or any previous visit (from the past 24 hours).    Social History   Tobacco Use  Smoking Status Never  Smokeless Tobacco Never    Goals Met:  Independence with exercise equipment Exercise tolerated well No report of concerns or symptoms today Strength training completed today  Goals Unmet:  Not Applicable  Comments: Service time is from 0814 to 0935.    Dr. Mechele Collin is Medical Director for Pulmonary Rehab at [facility].

## 2024-01-09 ENCOUNTER — Encounter (HOSPITAL_COMMUNITY)
Admission: RE | Admit: 2024-01-09 | Discharge: 2024-01-09 | Disposition: A | Discharge status: Home / Self Care | Source: Ambulatory Visit | Attending: Pulmonary Disease | Admitting: Pulmonary Disease

## 2024-01-09 VITALS — Wt 120.8 lb

## 2024-01-09 DIAGNOSIS — J849 Interstitial pulmonary disease, unspecified: Secondary | ICD-10-CM | POA: Diagnosis not present

## 2024-01-09 NOTE — Progress Notes (Signed)
 Daily Session Note  Patient Details  Name: Rickey Erickson MRN: [id number] Date of Birth: Aug 25, 1956 Referring Provider:   [name] Pulmonary Rehab Walk Test from 09/18/2023 in [facility]  Referring Provider [name]       Encounter Date: 01/09/2024  Check In:  Session Check In - 01/09/24 0825       Check-In   Supervising physician immediately available to respond to emergencies [facility] MD immediately available    Physician(s) Oris Drone, NP    Location [facility]    Staff Present Raford Pitcher, MS, ACSM-CEP, Exercise Physiologist;Arney Mayabb Erin Sons BS, ACSM-CEP, Exercise Physiologist;Mary Gerre Scull, RN, BSN    Virtual Visit No    Medication changes reported     No    Fall or balance concerns reported    No    Tobacco Cessation No Change    Warm-up and Cool-down Performed as group-led instruction    Resistance Training Performed Yes    VAD Patient? No    PAD/SET Patient? No      Pain Assessment   Currently in Pain? No/denies             Capillary Blood Glucose: No results found for this or any previous visit (from the past 24 hours).   Exercise Prescription Changes - 01/09/24 0900       Response to Exercise   Blood Pressure (Admit) 122/64    Blood Pressure (Exercise) 146/66    Blood Pressure (Exit) 110/64    Heart Rate (Admit) 106 bpm    Heart Rate (Exercise) 123 bpm    Heart Rate (Exit) 107 bpm    Oxygen Saturation (Admit) 94 %    Oxygen Saturation (Exercise) 90 %    Oxygen Saturation (Exit) 90 %    Rating of Perceived Exertion (Exercise) 15    Perceived Dyspnea (Exercise) 3    Duration Continue with 30 min of aerobic exercise without signs/symptoms of physical distress.    Intensity THRR New      Progression   Progression Continue to progress workloads to maintain intensity without signs/symptoms of physical distress.      Resistance Training   Training Prescription Yes     Weight red bands    Reps 10-15    Time 10 Minutes      Recumbant Bike   Level 3    RPM 47    Watts 22    Minutes 15    METs 2.6      Track   Laps 4.5    Minutes 15    METs 1.6             Social History   Tobacco Use  Smoking Status Never  Smokeless Tobacco Never    Goals Met:  Proper associated with RPD/PD & O2 Sat Independence with exercise equipment Exercise tolerated well No report of concerns or symptoms today Strength training completed today  Goals Unmet:  Not Applicable  Comments: Service time is from 0814 to 0919.    Dr. Mechele Collin is Medical Director for Pulmonary Rehab at [facility].

## 2024-01-10 NOTE — Progress Notes (Signed)
 Pulmonary Individual Treatment Plan  Patient Details  Name: Rickey Erickson MRN: [id number] Date of Birth: 1956/04/01 Referring Provider:   [name] Pulmonary Rehab Walk Test from 09/18/2023 in [facility]  Referring Provider [name]       Initial Encounter Date:  Flowsheet Row Pulmonary Rehab Walk Test from 09/18/2023 in [facility]  Date 09/18/23       Visit Diagnosis: ILD (interstitial lung disease) (HCC)  Patient's Home Medications on Admission:   Current Outpatient Medications:    acetaminophen (TYLENOL) 500 MG tablet, Take 500 mg by mouth every 6 (six) hours as needed for headache (pain)., Disp: , Rfl:    albumin human 25 % bottle, Inject 100 mLs (25 g total) into the vein as needed (Give during dialysis for SBP <100.)., Disp: , Rfl:    apixaban (ELIQUIS) 2.5 MG TABS tablet, Take 2.5 mg by mouth 2 (two) times daily., Disp: , Rfl:    atorvastatin (LIPITOR) 20 MG tablet, Take 20 mg by mouth every morning., Disp: , Rfl:    CALCIUM PO, Take 1 tablet by mouth 2 (two) times daily., Disp: , Rfl:    Darbepoetin Alfa (ARANESP) 150 MCG/0.3ML SOSY injection, Inject 0.3 mLs (150 mcg total) into the skin every Wednesday at 6 PM., Disp: , Rfl:    midodrine (PROAMATINE) 10 MG tablet, Take 10 mg by mouth See admin instructions. Take 10 mg once daily prior to dialysis on Monday, Wednesday, Friday., Disp: , Rfl:    mirtazapine (REMERON) 30 MG tablet, Take 30 mg by mouth at bedtime., Disp: , Rfl:    pantoprazole (PROTONIX) 40 MG tablet, Take 40 mg by mouth every morning., Disp: , Rfl:    tacrolimus (PROGRAF) 1 MG capsule, Take 1 mg by mouth 2 (two) times daily. Take in addition to 5 mg capsule twice daily, for a total dose of 6 mg twice daily., Disp: , Rfl:    tacrolimus (PROGRAF) 5 MG capsule, Take 5 mg by mouth 2 (two) times daily. Take in addition to 1 mg capsule twice daily,  for a total dose of 6 mg twice daily., Disp: , Rfl:   Past Medical History: Past Medical History:  Diagnosis Date   Anemia    Heart failure (HCC)    Hypertension    Osteoporosis    Renal disorder     Tobacco Use: Social History   Tobacco Use  Smoking Status Never  Smokeless Tobacco Never    Labs: Review Flowsheet  More data may exist      Latest Ref Rng & Units 01/06/2022 09/08/2022 03/07/2023 07/21/2023 07/26/2023  Labs for ITP Cardiac and Pulmonary Rehab  Cholestrol 0 - 200 mg/dL 027  253  - - -  LDL (calc) 0 - 99 mg/dL 90  80  - - -  HDL-C >66 mg/dL 30  39  - - -  Trlycerides <150 mg/dL 90  86  - - -  Hemoglobin A1c 4.8 - 5.6 % 4.9  - - - -  PH, Arterial 7.35 - 7.45 - - 7.497  7.227  7.109  7.060  -  PCO2 arterial 32 - 48 mmHg - - 33.6  56.0  85.7  107.3  -  Bicarbonate 20.0 - 28.0 mmol/L - - 26.0  23.3  27.6  25.0  27.4  30.5  23.9  25.0   TCO2 22 - 32 mmol/L - - 27  25  30  27  30   34  -  Acid-base deficit 0.0 - 2.0 mmol/L - - - 5.0  4.0  6.0  4.0  3.0  6.0  4.7   O2 Saturation % - - 96  93  30  75  86  92  98.2  77.3     Details       Multiple values from one day are sorted in reverse-chronological order         Capillary Blood Glucose: Lab Results  Component Value Date   GLUCAP 80 07/27/2023   GLUCAP 92 07/21/2023   GLUCAP 86 07/21/2023     Pulmonary Assessment Scores:  Pulmonary Assessment Scores     Row Name 09/18/23 0812         ADL UCSD   ADL Phase Entry       mMRC Score   mMRC Score 2             UCSD: Self-administered rating of dyspnea associated with activities of daily living (ADLs) 6-point scale (0 = "not at all" to 5 = "maximal or unable to do because of breathlessness")  Scoring Scores range from 0 to 120.  Minimally important difference is 5 units  CAT: CAT can identify the health impairment of COPD patients and is better correlated with disease progression.  CAT has a scoring range of zero to 40. The CAT score is  classified into four groups of low (less than 10), medium (10 - 20), high (21-30) and very high (31-40) based on the impact level of disease on health status. A CAT score over 10 suggests significant symptoms.  A worsening CAT score could be explained by an exacerbation, poor medication adherence, poor inhaler technique, or progression of COPD or comorbid conditions.  CAT MCID is 2 points  mMRC: mMRC (Modified Medical Research Council) Dyspnea Scale is used to assess the degree of baseline functional disability in patients of respiratory disease due to dyspnea. No minimal important difference is established. A decrease in score of 1 point or greater is considered a positive change.   Pulmonary Function Assessment:  Pulmonary Function Assessment - 09/18/23 0812       Breath   Bilateral Breath Sounds Decreased    Shortness of Breath Yes;Limiting activity             Exercise Target Goals: Exercise Program Goal: Individual exercise prescription set using results from initial 6 min walk test and THRR while considering  patient's activity barriers and safety.   Exercise Prescription Goal: Initial exercise prescription builds to 30-45 minutes a day of aerobic activity, 2-3 days per week.  Home exercise guidelines will be given to patient during program as part of exercise prescription that the participant will acknowledge.  Activity Barriers & Risk Stratification:   6 Minute Walk:  6 Minute Walk     Row Name 09/18/23 0906         6 Minute Walk   Phase Initial     Distance 1140 feet     Walk Time 6 minutes     # of Rest Breaks 0     MPH 2.16     METS 3.68     RPE 13     Perceived Dyspnea  2     VO2 Peak 12.87     Symptoms No     Resting HR 114 bpm     Resting BP 130/68     Resting Oxygen Saturation  95 %  Exercise Oxygen Saturation  during 6 min walk 92 %     Max Ex. HR 131 bpm     Max Ex. BP 134/68     2 Minute Post BP 122/70       Interval HR   1 Minute HR 122      2 Minute HR 128     3 Minute HR 129     4 Minute HR 128     5 Minute HR 131     6 Minute HR 131     2 Minute Post HR 119     Interval Heart Rate? Yes       Interval Oxygen   Interval Oxygen? Yes     Baseline Oxygen Saturation % 95 %     1 Minute Oxygen Saturation % 94 %     1 Minute Liters of Oxygen 0 L     2 Minute Oxygen Saturation % 94 %     2 Minute Liters of Oxygen 0 L     3 Minute Oxygen Saturation % 92 %     3 Minute Liters of Oxygen 0 L     4 Minute Oxygen Saturation % 92 %     4 Minute Liters of Oxygen 0 L     5 Minute Oxygen Saturation % 93 %     5 Minute Liters of Oxygen 0 L     6 Minute Oxygen Saturation % 92 %     6 Minute Liters of Oxygen 0 L     2 Minute Post Oxygen Saturation % 95 %     2 Minute Post Liters of Oxygen 0 L              Oxygen Initial Assessment:  Oxygen Initial Assessment - 09/18/23 0811       Home Oxygen   Home Oxygen Device None    Sleep Oxygen Prescription CPAP    Liters per minute 2    Home Exercise Oxygen Prescription None    Home Resting Oxygen Prescription None    Compliance with Home Oxygen Use Yes      Initial 6 min Walk   Oxygen Used None      Program Oxygen Prescription   Program Oxygen Prescription None      Intervention   Short Term Goals To learn and exhibit compliance with exercise, home and travel O2 prescription;To learn and understand importance of maintaining oxygen saturations>88%;To learn and demonstrate proper use of respiratory medications;To learn and understand importance of monitoring SPO2 with pulse oximeter and demonstrate accurate use of the pulse oximeter.;To learn and demonstrate proper pursed lip breathing techniques or other breathing techniques.     Long  Term Goals Exhibits compliance with exercise, home  and travel O2 prescription;Verbalizes importance of monitoring SPO2 with pulse oximeter and return demonstration;Exhibits proper breathing techniques, such as pursed lip breathing or other  method taught during program session;Maintenance of O2 saturations>88%;Compliance with respiratory medication             Oxygen Re-Evaluation:  Oxygen Re-Evaluation     Row Name 07/18/23 1601 10/05/23 1622 11/06/23 0857 12/04/23 0919 01/03/24 0929     Program Oxygen Prescription   Program Oxygen Prescription Continuous None None None None   Liters per minute 2 -- -- -- --     Home Oxygen   Home Oxygen Device E-Tanks;Home Concentrator None None None None   Sleep Oxygen Prescription Continuous CPAP CPAP CPAP CPAP   Liters  per minute 1 2 2 2 2    Home Exercise Oxygen Prescription Continuous None None None None   Liters per minute 2 -- -- -- --   Home Resting Oxygen Prescription Continuous None None None None   Liters per minute 2 -- -- -- --   Compliance with Home Oxygen Use Yes Yes Yes Yes Yes     Goals/Expected Outcomes   Short Term Goals To learn and exhibit compliance with exercise, home and travel O2 prescription;To learn and understand importance of maintaining oxygen saturations>88%;To learn and demonstrate proper use of respiratory medications;To learn and understand importance of monitoring SPO2 with pulse oximeter and demonstrate accurate use of the pulse oximeter.;To learn and demonstrate proper pursed lip breathing techniques or other breathing techniques.  To learn and exhibit compliance with exercise, home and travel O2 prescription;To learn and understand importance of maintaining oxygen saturations>88%;To learn and demonstrate proper use of respiratory medications;To learn and understand importance of monitoring SPO2 with pulse oximeter and demonstrate accurate use of the pulse oximeter.;To learn and demonstrate proper pursed lip breathing techniques or other breathing techniques.  To learn and exhibit compliance with exercise, home and travel O2 prescription;To learn and understand importance of maintaining oxygen saturations>88%;To learn and demonstrate proper use of  respiratory medications;To learn and understand importance of monitoring SPO2 with pulse oximeter and demonstrate accurate use of the pulse oximeter.;To learn and demonstrate proper pursed lip breathing techniques or other breathing techniques.  To learn and exhibit compliance with exercise, home and travel O2 prescription;To learn and understand importance of maintaining oxygen saturations>88%;To learn and demonstrate proper use of respiratory medications;To learn and understand importance of monitoring SPO2 with pulse oximeter and demonstrate accurate use of the pulse oximeter.;To learn and demonstrate proper pursed lip breathing techniques or other breathing techniques.  To learn and exhibit compliance with exercise, home and travel O2 prescription;To learn and understand importance of maintaining oxygen saturations>88%;To learn and demonstrate proper use of respiratory medications;To learn and understand importance of monitoring SPO2 with pulse oximeter and demonstrate accurate use of the pulse oximeter.;To learn and demonstrate proper pursed lip breathing techniques or other breathing techniques.    Long  Term Goals Exhibits compliance with exercise, home  and travel O2 prescription;Verbalizes importance of monitoring SPO2 with pulse oximeter and return demonstration;Exhibits proper breathing techniques, such as pursed lip breathing or other method taught during program session;Maintenance of O2 saturations>88%;Compliance with respiratory medication Exhibits compliance with exercise, home  and travel O2 prescription;Verbalizes importance of monitoring SPO2 with pulse oximeter and return demonstration;Exhibits proper breathing techniques, such as pursed lip breathing or other method taught during program session;Maintenance of O2 saturations>88%;Compliance with respiratory medication Exhibits compliance with exercise, home  and travel O2 prescription;Verbalizes importance of monitoring SPO2 with pulse oximeter  and return demonstration;Exhibits proper breathing techniques, such as pursed lip breathing or other method taught during program session;Maintenance of O2 saturations>88%;Compliance with respiratory medication Exhibits compliance with exercise, home  and travel O2 prescription;Verbalizes importance of monitoring SPO2 with pulse oximeter and return demonstration;Exhibits proper breathing techniques, such as pursed lip breathing or other method taught during program session;Maintenance of O2 saturations>88%;Compliance with respiratory medication Exhibits compliance with exercise, home  and travel O2 prescription;Verbalizes importance of monitoring SPO2 with pulse oximeter and return demonstration;Exhibits proper breathing techniques, such as pursed lip breathing or other method taught during program session;Maintenance of O2 saturations>88%;Compliance with respiratory medication   Goals/Expected Outcomes Compliance and understanding of oxygen saturation monitoring and breathing techniques to decrease shortness of breath. Compliance  and understanding of oxygen saturation monitoring and breathing techniques to decrease shortness of breath. Compliance and understanding of oxygen saturation monitoring and breathing techniques to decrease shortness of breath. Compliance and understanding of oxygen saturation monitoring and breathing techniques to decrease shortness of breath. Compliance and understanding of oxygen saturation monitoring and breathing techniques to decrease shortness of breath.            Oxygen Discharge (Final Oxygen Re-Evaluation):  Oxygen Re-Evaluation - 01/03/24 0929       Program Oxygen Prescription   Program Oxygen Prescription None      Home Oxygen   Home Oxygen Device None    Sleep Oxygen Prescription CPAP    Liters per minute 2    Home Exercise Oxygen Prescription None    Home Resting Oxygen Prescription None    Compliance with Home Oxygen Use Yes      Goals/Expected  Outcomes   Short Term Goals To learn and exhibit compliance with exercise, home and travel O2 prescription;To learn and understand importance of maintaining oxygen saturations>88%;To learn and demonstrate proper use of respiratory medications;To learn and understand importance of monitoring SPO2 with pulse oximeter and demonstrate accurate use of the pulse oximeter.;To learn and demonstrate proper pursed lip breathing techniques or other breathing techniques.     Long  Term Goals Exhibits compliance with exercise, home  and travel O2 prescription;Verbalizes importance of monitoring SPO2 with pulse oximeter and return demonstration;Exhibits proper breathing techniques, such as pursed lip breathing or other method taught during program session;Maintenance of O2 saturations>88%;Compliance with respiratory medication    Goals/Expected Outcomes Compliance and understanding of oxygen saturation monitoring and breathing techniques to decrease shortness of breath.             Initial Exercise Prescription:  Initial Exercise Prescription - 09/18/23 0900       Date of Initial Exercise RX and Referring Provider   Date 09/18/23    Referring Provider [name]    Expected Discharge Date 12/14/23      Oxygen   Maintain Oxygen Saturation 88% or higher      Recumbant Bike   Level 1    Minutes 15    METs 2.5      Track   Minutes 15    METs 2.5      Intensity   THRR 40-80% of Max Heartrate 62-124    Ratings of Perceived Exertion 11-13    Perceived Dyspnea 0-4      Progression   Progression Continue to progress workloads to maintain intensity without signs/symptoms of physical distress.      Resistance Training   Training Prescription Yes    Weight red bands    Reps 10-15             Perform Capillary Blood Glucose checks as needed.  Exercise Prescription Changes:   Exercise Prescription Changes     Row Name 10/03/23 0900 10/17/23 0900 10/31/23 0900 11/14/23 0900 11/28/23 0900      Response to Exercise   Blood Pressure (Admit) 112/68 114/64 140/78 124/68 134/78   Blood Pressure (Exercise) -- 138/70 -- 158/60 156/68   Blood Pressure (Exit) 112/64 132/68 124/70 118/62 116/64   Heart Rate (Admit) 118 bpm 112 bpm 114 bpm 114 bpm 115 bpm   Heart Rate (Exercise) 131 bpm 126 bpm 131 bpm 125 bpm 124 bpm   Heart Rate (Exit) 117 bpm 115 bpm 113 bpm 112 bpm 114 bpm   Oxygen Saturation (Admit) 100 % 100 % 100 %  97 % 96 %   Oxygen Saturation (Exercise) 96 % 95 % 90 % 93 % 97 %   Oxygen Saturation (Exit) 98 % 100 % 99 % 95 % 99 %   Rating of Perceived Exertion (Exercise) 13 12 14 15 15    Perceived Dyspnea (Exercise) 3 2 3 3 3    Duration Continue with 30 min of aerobic exercise without signs/symptoms of physical distress. Continue with 30 min of aerobic exercise without signs/symptoms of physical distress. Continue with 30 min of aerobic exercise without signs/symptoms of physical distress. Continue with 30 min of aerobic exercise without signs/symptoms of physical distress. Continue with 30 min of aerobic exercise without signs/symptoms of physical distress.   Intensity THRR New  62-150 THRR unchanged THRR unchanged THRR unchanged THRR unchanged     Progression   Progression Continue to progress workloads to maintain intensity without signs/symptoms of physical distress. -- Continue to progress workloads to maintain intensity without signs/symptoms of physical distress. Continue to progress workloads to maintain intensity without signs/symptoms of physical distress. Continue to progress workloads to maintain intensity without signs/symptoms of physical distress.     Resistance Training   Training Prescription Yes Yes Yes Yes Yes   Weight red bands red bands red bands red bands red bands   Reps 10-15 10-15 10-15 10-15 10-15   Time 10 Minutes 10 Minutes 10 Minutes 10 Minutes 10 Minutes     Recumbant Bike   Level 2 2 3 2 3    RPM -- -- 55 57 48   Watts -- -- 21 15 17    Minutes 15 15  15 15 15    METs 2.3 2.4 2.5 2.3 2.3     Track   Laps 6.5 6.5 5 5.5 5.5   Minutes 15 15 15 15 15    METs 2 2 1.77 1.82 1.82     Oxygen   Maintain Oxygen Saturation -- -- -- 88% or higher --    Row Name 12/12/23 0900 12/19/23 0853 01/09/24 0900         Response to Exercise   Blood Pressure (Admit) 130/68 130/70 122/64     Blood Pressure (Exercise) 168/70 -- 146/66     Blood Pressure (Exit) 116/64 120/68 110/64     Heart Rate (Admit) 115 bpm 110 bpm 106 bpm     Heart Rate (Exercise) 126 bpm 125 bpm 123 bpm     Heart Rate (Exit) 114 bpm 112 bpm 107 bpm     Oxygen Saturation (Admit) 98 % 95 % 94 %     Oxygen Saturation (Exercise) 95 % 88 % 90 %     Oxygen Saturation (Exit) 99 % 99 % 90 %     Rating of Perceived Exertion (Exercise) 14 15 15      Perceived Dyspnea (Exercise) 2 3 3      Duration Continue with 30 min of aerobic exercise without signs/symptoms of physical distress. Continue with 30 min of aerobic exercise without signs/symptoms of physical distress. Continue with 30 min of aerobic exercise without signs/symptoms of physical distress.     Intensity THRR unchanged THRR unchanged THRR New       Progression   Progression Continue to progress workloads to maintain intensity without signs/symptoms of physical distress. Continue to progress workloads to maintain intensity without signs/symptoms of physical distress. Continue to progress workloads to maintain intensity without signs/symptoms of physical distress.       Resistance Training   Training Prescription Yes Yes Yes  Weight red bands red bands red bands     Reps 10-15 10-15 10-15     Time 10 Minutes 10 Minutes 10 Minutes       Recumbant Bike   Level 3 3  3  decreased to level 2 3     RPM 51 50 47     Watts 20 19 22      Minutes 15 15 15      METs 2.5 2.4 2.6       Track   Laps 5 5 4.5     Minutes 15 15 15      METs 1.77 1.77 1.6       Oxygen   Maintain Oxygen Saturation -- 88% or higher --               Exercise Comments:   Exercise Comments     Row Name 09/26/23 0924           Exercise Comments Pt completed first day of group exercise. He exercised 15 min on the recumbent bike, level 2, METs 2.2. He then walked the track for 15 min, 2.0 METs, with seated breaks. Pt is feeling well and tolerated exercise better than last time he did pulmonary rehab. He performed warm up and cool down mostly standing, including squats. Discussed METS with good reception. Pt happy to be back.                Exercise Goals and Review:   Exercise Goals     Row Name 09/18/23 0813             Exercise Goals   Increase Physical Activity Yes       Intervention Provide advice, education, support and counseling about physical activity/exercise needs.;Develop an individualized exercise prescription for aerobic and resistive training based on initial evaluation findings, risk stratification, comorbidities and participant's personal goals.       Expected Outcomes Short Term: Attend rehab on a regular basis to increase amount of physical activity.;Long Term: Exercising regularly at least 3-5 days a week.;Long Term: Add in home exercise to make exercise part of routine and to increase amount of physical activity.       Increase Strength and Stamina Yes       Intervention Provide advice, education, support and counseling about physical activity/exercise needs.;Develop an individualized exercise prescription for aerobic and resistive training based on initial evaluation findings, risk stratification, comorbidities and participant's personal goals.       Expected Outcomes Short Term: Increase workloads from initial exercise prescription for resistance, speed, and METs.;Short Term: Perform resistance training exercises routinely during rehab and add in resistance training at home;Long Term: Improve cardiorespiratory fitness, muscular endurance and strength as measured by increased METs and functional capacity  ( )       Able to understand and use rate of perceived exertion (RPE) scale Yes       Intervention Provide education and explanation on how to use RPE scale       Expected Outcomes Short Term: Able to use RPE daily in rehab to express subjective intensity level;Long Term:  Able to use RPE to guide intensity level when exercising independently       Able to understand and use Dyspnea scale Yes       Intervention Provide education and explanation on how to use Dyspnea scale       Expected Outcomes Short Term: Able to use Dyspnea scale daily in rehab to express subjective sense of shortness of breath  during exertion;Long Term: Able to use Dyspnea scale to guide intensity level when exercising independently       Knowledge and understanding of Target Heart Rate Range (THRR) Yes       Intervention Provide education and explanation of THRR including how the numbers were predicted and where they are located for reference       Expected Outcomes Short Term: Able to state/look up THRR;Long Term: Able to use THRR to govern intensity when exercising independently;Short Term: Able to use daily as guideline for intensity in rehab       Understanding of Exercise Prescription Yes       Intervention Provide education, explanation, and written materials on patient's individual exercise prescription       Expected Outcomes Short Term: Able to explain program exercise prescription;Long Term: Able to explain home exercise prescription to exercise independently                Exercise Goals Re-Evaluation :  Exercise Goals Re-Evaluation     Row Name 07/18/23 1557 07/27/23 1552 09/18/23 0813 10/05/23 1620 11/06/23 0855     Exercise Goal Re-Evaluation   Exercise Goals Review Increase Physical Activity;Able to understand and use Dyspnea scale;Understanding of Exercise Prescription;Increase Strength and Stamina;Knowledge and understanding of Target Heart Rate Range (THRR);Able to understand and use rate of  perceived exertion (RPE) scale Increase Physical Activity;Able to understand and use Dyspnea scale;Understanding of Exercise Prescription;Increase Strength and Stamina;Knowledge and understanding of Target Heart Rate Range (THRR);Able to understand and use rate of perceived exertion (RPE) scale Increase Physical Activity;Able to understand and use Dyspnea scale;Understanding of Exercise Prescription;Increase Strength and Stamina;Knowledge and understanding of Target Heart Rate Range (THRR);Able to understand and use rate of perceived exertion (RPE) scale Increase Physical Activity;Able to understand and use Dyspnea scale;Understanding of Exercise Prescription;Increase Strength and Stamina;Knowledge and understanding of Target Heart Rate Range (THRR);Able to understand and use rate of perceived exertion (RPE) scale Increase Physical Activity;Able to understand and use Dyspnea scale;Understanding of Exercise Prescription;Increase Strength and Stamina;Knowledge and understanding of Target Heart Rate Range (THRR);Able to understand and use rate of perceived exertion (RPE) scale   Comments Skip has completed 19 exercise sessions. He exercises for 15 min on the Nustep and track. Pt averages 2.7 METs at level 3 on the Nustep and 1.77 METs on the track. Skip performs the warmup and cooldown standing/ seated dependent on his shortness of breath. Skip's METs have remained relatively the same because he has had other health issues and had to miss class. He is motivated to exercise as he tried to exercise at his best ability today. We have discussed home exercise. Skip is trying exercise at home as often as he can. Will continue to monitor and progress as able. Skip completed 19 exercise sessions. Peak METs were 3.4 on the Nustep and 1.77 on the track. He is being discharged due to hospitalization. Skip is scheduled begin exercise again on 12/3. Skip has completed 3 exercise sessions. He exercises for 15 min on the recumbent  bike and track. Skip averages 2.4 METs at level 2 on the recumbent bike and 1.92 METs on the track. He performs the warmup and cooldown mostly standing dependent on his shortness of breath. It is too soon to notate any discernable differences. Will continue to monitor and progress as able. Skip has completed 9 exercise sessions. He exercises for 15 min on the recumbent bike and track. Skip averages 2.5 METs at level 3 on the recumbent bike and  1.77 METs on the track. He performs the warmup and cooldown mostly standing dependent on his shortness of breath. Skip has increased his workload for the recumbent bike as METs have slightly increased. His track METs have remained relatively the same. Skip also seems to do more of the warmup and cooldown standing. Will continue to monitor and progress as able.   Expected Outcomes Through exercise at rehab and home, the patient will decrease shortness of breath with daily activities and feel confident in carrying out an exercise regimen at home. Through exercise at rehab and home, the patient will decrease shortness of breath with daily activities and feel confident in carrying out an exercise regimen at home. Through exercise at rehab and home, the patient will decrease shortness of breath with daily activities and feel confident in carrying out an exercise regimen at home. Through exercise at rehab and home, the patient will decrease shortness of breath with daily activities and feel confident in carrying out an exercise regimen at home. Through exercise at rehab and home, the patient will decrease shortness of breath with daily activities and feel confident in carrying out an exercise regimen at home.    Row Name 12/04/23 3086 01/03/24 0928           Exercise Goal Re-Evaluation   Exercise Goals Review Increase Physical Activity;Able to understand and use Dyspnea scale;Understanding of Exercise Prescription;Increase Strength and Stamina;Knowledge and understanding of  Target Heart Rate Range (THRR);Able to understand and use rate of perceived exertion (RPE) scale Increase Physical Activity;Able to understand and use Dyspnea scale;Understanding of Exercise Prescription;Increase Strength and Stamina;Knowledge and understanding of Target Heart Rate Range (THRR);Able to understand and use rate of perceived exertion (RPE) scale      Comments Skip has completed 16 exercise sessions. He exercises for 15 min on the recumbent bike and track. Skip averages 2.3 METs at level 3 on the recumbent bike and 2.81 METs on the track. He performs the warmup and cooldown mostly standing dependent on his shortness of breath. Skip has increased his METs for the track although METs remain about the same on the recumbent bike. I have asked Skip he needed to discuss home exercise. Skip did not wish to discuss home exercise as he feels has has all the resources he needs to exercise outside of rehab. Will continue to monitor and progress as able. Skip has completed 23 exercise sessions. He exercises for 15 min on the recumbent bike and track. Skip averages 2.7 METs at level 3 on the recumbent bike and 1.92 METs on the track. He performs the warmup and cooldown mostly standing dependent on his shortness of breath. Skip has struggled to progress lately due to a recent hospitalization. Will continue to monitor and progress as able.      Expected Outcomes Through exercise at rehab and home, the patient will decrease shortness of breath with daily activities and feel confident in carrying out an exercise regimen at home. Through exercise at rehab and home, the patient will decrease shortness of breath with daily activities and feel confident in carrying out an exercise regimen at home.               Discharge Exercise Prescription (Final Exercise Prescription Changes):  Exercise Prescription Changes - 01/09/24 0900       Response to Exercise   Blood Pressure (Admit) 122/64    Blood Pressure  (Exercise) 146/66    Blood Pressure (Exit) 110/64    Heart Rate (Admit) 106  bpm    Heart Rate (Exercise) 123 bpm    Heart Rate (Exit) 107 bpm    Oxygen Saturation (Admit) 94 %    Oxygen Saturation (Exercise) 90 %    Oxygen Saturation (Exit) 90 %    Rating of Perceived Exertion (Exercise) 15    Perceived Dyspnea (Exercise) 3    Duration Continue with 30 min of aerobic exercise without signs/symptoms of physical distress.    Intensity THRR New      Progression   Progression Continue to progress workloads to maintain intensity without signs/symptoms of physical distress.      Resistance Training   Training Prescription Yes    Weight red bands    Reps 10-15    Time 10 Minutes      Recumbant Bike   Level 3    RPM 47    Watts 22    Minutes 15    METs 2.6      Track   Laps 4.5    Minutes 15    METs 1.6             Nutrition:  Target Goals: Understanding of nutrition guidelines, daily intake of sodium 1500mg , cholesterol 200mg , calories 30% from fat and 7% or less from saturated fats, daily to have 5 or more servings of fruits and vegetables.  Biometrics:  Pre Biometrics - 09/18/23 0805       Pre Biometrics   Grip Strength 22 kg              Nutrition Therapy Plan and Nutrition Goals:  Nutrition Therapy & Goals - 01/01/24 1142       Nutrition Therapy   Diet Heart Healthy Diet    Drug/Food Interactions Statins/Certain Fruits      Personal Nutrition Goals   Nutrition Goal Patient to identify strategies for weight maintenance/ weight gain of 0.5-2.0# per week.   goal in progress.   Comments Goal in progress. Skip has previously participated in pulmonary rehab in September 2024. Skip continues regular follow-up for kidney transplant; he has required dialysis since heart transplant in 2021. He currently receives hemodialysis M/W/F. His weight has been stable since new start to pulmonary rehab at 54.3kg (09/18/23); however, weight is down ~10# over the last 5  months. He lives with his sister who is a great support and does all of the grocery shopping and cooking. Skip reports drinking 1 Boost Breeze daily (250kcals, 9g protein) and 1 [employer] Milkshake (580kcals, 13g protein) daily to aid with weight gain/weight maintenance. We have discussed multiple strategies for weight gain including increasing eating frequency, protein supplements, increasing calories from fat/protein/carbohydrates, etc. Dialysis team continues to monitor weight as well. Patient will benefit from participation in pulmonary rehab for nutrition, exercise, and lifestyle modification.      Intervention Plan   Intervention Prescribe, educate and counsel regarding individualized specific dietary modifications aiming towards targeted core components such as weight, hypertension, lipid management, diabetes, heart failure and other comorbidities.;Nutrition handout(s) given to patient.    Expected Outcomes Long Term Goal: Adherence to prescribed nutrition plan.;Short Term Goal: Understand basic principles of dietary content, such as calories, fat, sodium, cholesterol and nutrients.             Nutrition Assessments:  MEDIFICTS Score Key: >=70 Need to make dietary changes  40-70 Heart Healthy Diet <= 40 Therapeutic Level Cholesterol Diet  Flowsheet Row PULMONARY REHAB OTHER RESPIRATORY from 05/02/2023 in [facility] for Heart, Vascular, & Lung Health  Picture Your Plate Total Score on Admission 62      Picture Your Plate Scores: <78 Unhealthy dietary pattern with much room for improvement. 41-50 Dietary pattern unlikely to meet recommendations for good health and room for improvement. 51-60 More healthful dietary pattern, with some room for improvement.  >60 Healthy dietary pattern, although there may be some specific behaviors that could be improved.    Nutrition Goals Re-Evaluation:  Nutrition Goals Re-Evaluation     Row Name 07/25/23 1140 01/01/24  1142           Goals   Current Weight 131 lb 2.8 oz (59.5 kg) 121 lb 0.5 oz (54.9 kg)      Comment Cr 4.99, Phos 5.6, [id number]; other most recent labs monitored by [facility] transplant team, triglycerides 161, HDL 34, Cr 8.58, GFR 6      Expected Outcome Goals in progress. Skip continues regular follow-up for kidney transplant; he has required dialysis since heart transplant in 2021. He currently receives hemodialysis M/W/F. His weight has been stable since hospitalization related to pneumonia in May 2024, and per diaylsis notes, his typical weight has been ~60kg. He is down about 3.5# since orientation to our program (start weight was 61.1kg). He lives with his sister who is a great support and does all of the grocery shopping and cooking. Skip reports drinking 1 Boost Breeze daily (250kcals, 9g protein) and 1 Chick-fil-a Milkshake (580kcals, 13g protein) daily to aid with weight gain/weight maintenance. We have discussed multiple strategies for weight gain including increasing eating frequency, protein supplements, increasing calories from fat/protein/carbohydrates, etc. He is currently hospitalized for acute respiratory failure; will continue to monitor goals upon patient's return. Patient will benefit from participation in pulmonary rehab for nutrition, exercise, and lifestyle modification. Goal in progress. Skip has previously participated in pulmonary rehab in September 2024. Skip continues regular follow-up for kidney transplant; he has required dialysis since heart transplant in 2021. He currently receives hemodialysis M/W/F. His weight has been stable since new start to pulmonary rehab at 54.3kg (09/18/23); however, weight is down ~10# over the last 5 months. He lives with his sister who is a great support and does all of the grocery shopping and cooking. Skip reports drinking 1 Boost Breeze daily (250kcals, 9g protein) and 1 Chick-fil-a Milkshake (580kcals, 13g protein) daily to aid with weight gain/weight  maintenance. We have discussed multiple strategies for weight gain including increasing eating frequency, protein supplements, increasing calories from fat/protein/carbohydrates, etc. Dialysis team continues to monitor weight as well. Patient will benefit from participation in pulmonary rehab for nutrition, exercise, and lifestyle modification.               Nutrition Goals Discharge (Final Nutrition Goals Re-Evaluation):  Nutrition Goals Re-Evaluation - 01/01/24 1142       Goals   Current Weight 121 lb 0.5 oz (54.9 kg)    Comment Cr 8.58, GFR 6    Expected Outcome Goal in progress. Skip has previously participated in pulmonary rehab in September 2024. Skip continues regular follow-up for kidney transplant; he has required dialysis since heart transplant in 2021. He currently receives hemodialysis M/W/F. His weight has been stable since new start to pulmonary rehab at 54.3kg (09/18/23); however, weight is down ~10# over the last 5 months. He lives with his sister who is a great support and does all of the grocery shopping and cooking. Skip reports drinking 1 Boost Breeze daily (250kcals, 9g protein) and 1 Chick-fil-a Milkshake (580kcals, 13g protein) daily to aid  with weight gain/weight maintenance. We have discussed multiple strategies for weight gain including increasing eating frequency, protein supplements, increasing calories from fat/protein/carbohydrates, etc. Dialysis team continues to monitor weight as well. Patient will benefit from participation in pulmonary rehab for nutrition, exercise, and lifestyle modification.             Psychosocial: Target Goals: Acknowledge presence or absence of significant depression and/or stress, maximize coping skills, provide positive support system. Participant is able to verbalize types and ability to use techniques and skills needed for reducing stress and depression.  Initial Review & Psychosocial Screening:  Initial Psych Review & Screening  - 09/18/23 0810       Initial Review   Current issues with None Identified      Family Dynamics   Good Support System? Yes    Comments sister and other family members      Barriers   Psychosocial barriers to participate in program There are no identifiable barriers or psychosocial needs.      Screening Interventions   Interventions Encouraged to exercise             Quality of Life Scores:  Scores of 19 and below usually indicate a poorer quality of life in these areas.  A difference of  2-3 points is a clinically meaningful difference.  A difference of 2-3 points in the total score of the Quality of Life Index has been associated with significant improvement in overall quality of life, self-image, physical symptoms, and general health in studies assessing change in quality of life.  PHQ-9: Review Flowsheet       09/18/2023 04/24/2023  Depression screen PHQ 2/9  Decreased Interest 0 0  Down, Depressed, Hopeless 0 0  PHQ - 2 Score 0 0  Altered sleeping 1 0  Tired, decreased energy 0 0  Change in appetite 0 0  Feeling bad or failure about yourself  0 0  Trouble concentrating 0 0  Moving slowly or fidgety/restless 0 0  Suicidal thoughts 0 0  PHQ-9 Score 1 0  Difficult doing work/chores Not difficult at all Not difficult at all   Interpretation of Total Score  Total Score Depression Severity:  1-4 = Minimal depression, 5-9 = Mild depression, 10-14 = Moderate depression, 15-19 = Moderately severe depression, 20-27 = Severe depression   Psychosocial Evaluation and Intervention:  Psychosocial Evaluation - 09/18/23 0810       Psychosocial Evaluation & Interventions   Interventions Encouraged to exercise with the program and follow exercise prescription    Comments Skip denies any psychosocial barriers to exercise.    Expected Outcomes For Skip to participate in PR free of psychosocial barriers.    Continue Psychosocial Services  No Follow up required              Psychosocial Re-Evaluation:  Psychosocial Re-Evaluation     Row Name 07/19/23 0913 09/20/23 0803 10/06/23 1422 11/13/23 1003 12/06/23 1423     Psychosocial Re-Evaluation   Current issues with None Identified None Identified None Identified None Identified None Identified   Comments Skip had to be discharged from the program on 07/26/23. His sister called [place] and stated he was hospitalized for bilateral pneumonia. He has been transferred from [facility] to [facility] Skip completed 19 sessions of Pulm Rehab. Skip never complained while in class, worked hard, even when he didn't feel well. Skip denied any psychosocial issues or concerns at his last re-evaluation. He has great support from his sister. Skip is  scheduled to start the program on 09/26/23. No new barriers or concerns since orientation. Skip continues to deny any psy/soc needs during his monthly psychosocial evaluation. He states that his health pretty much dictates his plans for the day. He attends dialysis 3 days a week, pulmonary rehab 2 days a week and filters in MD appointments in between. 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 He states he has great support from his sister who drives him to and from appointments as well as serves as his primary caretaker. He denies any needs at this time. Skip has positive coping skills with a positive outlook. Skip continues to deny any psy/soc needs during his monthly psychosocial evaluation. He states that his health pretty much dictates his plans for the day. He attends dialysis 3 days a week, pulmonary rehab 2 days a week and filters in MD appointments in between. 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 He states he has great support from his sister who drives him to and from appointments as well as serves as his primary caretaker. He denies any needs at this time. Skip has positive coping skills with a positive outlook.  Skip continues to deny any psy/soc needs during his monthly psychosocial evaluation. He states he has great support from his sister who drives him to and from appointments as well as serves as his primary caretaker. He denies any needs at this time. Skip has positive coping skills with a positive outlook.   Expected Outcomes For Skip to continue to exercise for stress relief post hospitalization For Skip to participate in PR free of any psychosocial barriers or concerns. For Skip to continue in PR free of any psychosocial barriers or concerns, to use positive coping skills to manage stress. For Skip to continue in PR free of any psychosocial barriers or concerns, to use positive coping skills to manage stress. For Skip to continue in PR free of any psychosocial barriers or concerns, to use positive coping skills to manage stress.   Interventions -- Encouraged to attend Pulmonary Rehabilitation for the exercise Encouraged to attend Pulmonary Rehabilitation for the exercise Encouraged to attend Pulmonary Rehabilitation for the exercise Encouraged to attend Pulmonary Rehabilitation for the exercise   Continue Psychosocial Services  No Follow up required No Follow up required No Follow up required No Follow up required No Follow up required    Row Name 01/04/24 1150             Psychosocial Re-Evaluation   Current issues with None Identified       Comments Skip continue to deny any psychosocial barriers or concerns       Expected Outcomes For Skip to continue in PR free of any psychosocial barriers or concerns, to use positive coping skills to manage stress.       Interventions Encouraged to attend Pulmonary Rehabilitation for the exercise       Continue Psychosocial Services  No Follow up required                Psychosocial Discharge (Final Psychosocial Re-Evaluation):  Psychosocial Re-Evaluation - 01/04/24 1150       Psychosocial Re-Evaluation   Current issues with None Identified     Comments Skip continue to deny any psychosocial barriers or concerns    Expected Outcomes For Skip to continue in PR free of any psychosocial barriers or concerns, to use positive coping skills to manage  stress.    Interventions Encouraged to attend Pulmonary Rehabilitation for the exercise    Continue Psychosocial Services  No Follow up required             Education: Education Goals: Education classes will be provided on a weekly basis, covering required topics. Participant will state understanding/return demonstration of topics presented.  Learning Barriers/Preferences:   Education Topics: Know Your Numbers Group instruction that is supported by a PowerPoint presentation. Instructor discusses importance of knowing and understanding resting, exercise, and post-exercise oxygen saturation, heart rate, and blood pressure. Oxygen saturation, heart rate, blood pressure, rating of perceived exertion, and dyspnea are reviewed along with a normal range for these values.  Flowsheet Row PULMONARY REHAB OTHER RESPIRATORY from 10/26/2023 in [facility] for Heart, Vascular, & Lung Health  Date 10/26/23  Educator EP  Instruction Review Code 1- Verbalizes Understanding       Exercise for the Pulmonary Patient Group instruction that is supported by a PowerPoint presentation. Instructor discusses benefits of exercise, core components of exercise, frequency, duration, and intensity of an exercise routine, importance of utilizing pulse oximetry during exercise, safety while exercising, and options of places to exercise outside of rehab.  Flowsheet Row PULMONARY REHAB OTHER RESPIRATORY from 10/19/2023 in [facility] for Heart, Vascular, & Lung Health  Date 10/19/23  Educator EP  Instruction Review Code 1- Verbalizes Understanding       MET Level  Group instruction provided by PowerPoint, verbal discussion, and written material to support subject  matter. Instructor reviews what METs are and how to increase METs.  Flowsheet Row PULMONARY REHAB OTHER RESPIRATORY from 12/14/2023 in [facility] for Heart, Vascular, & Lung Health  Date 12/14/23  Educator EP  Instruction Review Code 1- Verbalizes Understanding       Pulmonary Medications Verbally interactive group education provided by instructor with focus on inhaled medications and proper administration. Flowsheet Row PULMONARY REHAB OTHER RESPIRATORY from 10/12/2023 in [facility] for Heart, Vascular, & Lung Health  Date 10/12/23       Anatomy and Physiology of the Respiratory System Group instruction provided by PowerPoint, verbal discussion, and written material to support subject matter. Instructor reviews respiratory cycle and anatomical components of the respiratory system and their functions. Instructor also reviews differences in obstructive and restrictive respiratory diseases with examples of each.  Flowsheet Row PULMONARY REHAB OTHER RESPIRATORY from 12/28/2023 in [facility] for Heart, Vascular, & Lung Health  Date 12/28/23  Educator RT  Instruction Review Code 1- Verbalizes Understanding       Oxygen Safety Group instruction provided by PowerPoint, verbal discussion, and written material to support subject matter. There is an overview of "What is Oxygen" and "Why do we need it".  Instructor also reviews how to create a safe environment for oxygen use, the importance of using oxygen as prescribed, and the risks of noncompliance. There is a brief discussion on traveling with oxygen and resources the patient may utilize. Flowsheet Row PULMONARY REHAB OTHER RESPIRATORY from 11/02/2023 in [facility] for Heart, Vascular, & Lung Health  Date 11/02/23  Educator RN  Instruction Review Code 1- Verbalizes Understanding       Oxygen Use Group instruction provided by PowerPoint,  verbal discussion, and written material to discuss how supplemental oxygen is prescribed and different types of oxygen supply systems. Resources for more information are provided.  Flowsheet Row PULMONARY REHAB OTHER RESPIRATORY  from 11/09/2023 in [facility] for Heart, Vascular, & Lung Health  Date 11/09/23  Educator RT  Instruction Review Code 1- Verbalizes Understanding       Breathing Techniques Group instruction that is supported by demonstration and informational handouts. Instructor discusses the benefits of pursed lip and diaphragmatic breathing and detailed demonstration on how to perform both.  Flowsheet Row PULMONARY REHAB OTHER RESPIRATORY from 11/16/2023 in [facility] for Heart, Vascular, & Lung Health  Date 11/16/23  Educator RN  Instruction Review Code 1- Verbalizes Understanding        Risk Factor Reduction Group instruction that is supported by a PowerPoint presentation. Instructor discusses the definition of a risk factor, different risk factors for pulmonary disease, and how the heart and lungs work together. Flowsheet Row PULMONARY REHAB OTHER RESPIRATORY from 12/07/2023 in [facility] for Heart, Vascular, & Lung Health  Date 12/07/23  Educator EP  Instruction Review Code 1- Verbalizes Understanding       Pulmonary Diseases Group instruction provided by PowerPoint, verbal discussion, and written material to support subject matter. Instructor gives an overview of the different type of pulmonary diseases. There is also a discussion on risk factors and symptoms as well as ways to manage the diseases. Flowsheet Row PULMONARY REHAB OTHER RESPIRATORY from 09/28/2023 in [facility] for Heart, Vascular, & Lung Health  Date 09/28/23  Educator RT  Instruction Review Code 1- Verbalizes Understanding       Stress and Energy Conservation Group instruction provided by PowerPoint,  verbal discussion, and written material to support subject matter. Instructor gives an overview of stress and the impact it can have on the body. Instructor also reviews ways to reduce stress. There is also a discussion on energy conservation and ways to conserve energy throughout the day. Flowsheet Row PULMONARY REHAB OTHER RESPIRATORY from 11/23/2023 in [facility] for Heart, Vascular, & Lung Health  Date 11/23/23  Educator RN  Instruction Review Code 1- Verbalizes Understanding       Warning Signs and Symptoms Group instruction provided by PowerPoint, verbal discussion, and written material to support subject matter. Instructor reviews warning signs and symptoms of stroke, heart attack, cold and flu. Instructor also reviews ways to prevent the spread of infection.   Other Education Group or individual verbal, written, or video instructions that support the educational goals of the pulmonary rehab program. Flowsheet Row PULMONARY REHAB OTHER RESPIRATORY from 06/29/2023 in [facility] for Heart, Vascular, & Lung Health  Date 06/29/23  Educator RT  Instruction Review Code 1- Verbalizes Understanding        Knowledge Questionnaire Score:   Core Components/Risk Factors/Patient Goals at Admission:  Personal Goals and Risk Factors at Admission - 09/18/23 0913       Core Components/Risk Factors/Patient Goals on Admission    Weight Management --    Improve shortness of breath with ADL's Yes    Intervention Provide education, individualized exercise plan and daily activity instruction to help decrease symptoms of SOB with activities of daily living.    Expected Outcomes Short Term: Improve cardiorespiratory fitness to achieve a reduction of symptoms when performing ADLs;Long Term: Be able to perform more ADLs without symptoms or delay the onset of symptoms             Core Components/Risk Factors/Patient Goals Review:   Goals and Risk  Factor Review     Row Name 07/19/23  7425 09/20/23 0805 10/06/23 1422 11/13/23 1007 12/06/23 1426     Core Components/Risk Factors/Patient Goals Review   Personal Goals Review Weight Management/Obesity;Improve shortness of breath with ADL's Improve shortness of breath with ADL's;Develop more efficient breathing techniques such as purse lipped breathing and diaphragmatic breathing and practicing self-pacing with activity. Improve shortness of breath with ADL's;Develop more efficient breathing techniques such as purse lipped breathing and diaphragmatic breathing and practicing self-pacing with activity. Improve shortness of breath with ADL's Improve shortness of breath with ADL's   Review Skip had to be discharged from the program on 07/26/23 after completing 19 sessions. Skip is currently hospitalized for bilateral pneumonia, and his plan is to go to skilled nursing after discharge. Core components/risk factors/patient goals are as follows: Goal not met for weight gain. Skip is down ~2.5# since orientation. Our dietician has spoken to both Skip and his sister for weight gain advice. Due to his recent illness, Skip had decreased appetite. Goal not met for improving shortness of breath with ADL's. We had weaned Skip off his oxygen during class, but due to his recent pleural effusion, thoracentesis, and bilateral pneumonia he had to be placed back on oxygen. Skip is scheduled to start PR on 09/26/23. Goal progressing for improving shortness of breath with ADL's. Goal progressing for developing more efficient breathing techniques such as purse lipped breathing and diaphragmatic breathing; and practicing self-pacing with activity. Monthly review of patient's Core Components/Risk Factors/Patient Goals are as follows: Goal progressing on improving Skip's shortness of breath with ADLs. Skip has been able to wean off oxygen with exertion. He currently only wears oxygen at night. Skip stated he is able to do "a little" more  for himself than previously. He can walk farther and has more stamina. Goal met for developing more efficient breathing techniques such as purse lipped breathing and diaphragmatic breathing; and practicing self-pacing activities. Skip can initiate purse lipped breathing on his own while exercising. He can correctly demonstrate diaphragmatic breathing to staff. He knows how to self-pace himself before he becomes breathless with exertion and/or exercising. Skip will continue to benefit from participation in PR for nutrition, education, exercise, and lifestyle modification. Monthly review of patient's Core Components/Risk Factors/Patient Goals are as follows: Goal progressing on improving Skip's shortness of breath with ADLs. Skip has been able to wean off oxygen with exertion. He currently only wears oxygen at night. Skip stated he is able to do "a little" more for himself than previously. He can walk farther and has more stamina. Skip will continue to benefit from participation in PR for nutrition, education, exercise, and lifestyle modification. Monthly review of patient's Core Components/Risk Factors/Patient Goals are as follows: Goal progressing on improving Skip's shortness of breath with ADLs. Skip has been able to wean off oxygen with exertion. He currently only wears oxygen at night. Skip stated he is able to do "a little" more for himself than previously. He can walk farther and has more stamina. Skip will continue to benefit from participation in PR for nutrition, education, exercise, and lifestyle modification.   Expected Outcomes For Skip to gain weight and improve SOB with his ADL's post pulmonary rehab For Skip to improve shortness of breath with ADL's and develop more efficient breathing techniques such as purse lipped breathing and diaphragmatic breathing; and practicing self-pacing with activity. For Skip to improve shortness of breath with ADL's For Skip to improve shortness of breath with ADL's For  Skip to improve shortness of breath with ADL's  Row Name 01/04/24 1151             Core Components/Risk Factors/Patient Goals Review   Personal Goals Review Improve shortness of breath with ADL's       Review Monthly review of patient's Core Components/Risk Factors/Patient Goals are as follows: Goal progressing on improving Skip's shortness of breath with ADLs. Skip is able to exercise on RA to keep his sats >88%. He is currently exercising on the recumbant bike and walking the track. Skip will continue to benefit from participation in PR for nutrition, education, exercise, and lifestyle modification.       Expected Outcomes For Skip to improve shortness of breath with ADL's                Core Components/Risk Factors/Patient Goals at Discharge (Final Review):   Goals and Risk Factor Review - 01/04/24 1151       Core Components/Risk Factors/Patient Goals Review   Personal Goals Review Improve shortness of breath with ADL's    Review Monthly review of patient's Core Components/Risk Factors/Patient Goals are as follows: Goal progressing on improving Skip's shortness of breath with ADLs. Skip is able to exercise on RA to keep his sats >88%. He is currently exercising on the recumbant bike and walking the track. Skip will continue to benefit from participation in PR for nutrition, education, exercise, and lifestyle modification.    Expected Outcomes For Skip to improve shortness of breath with ADL's             ITP Comments: Pt is making expected progress toward Pulmonary Rehab goals after completing 25 session(s). Recommend continued exercise, life style modification, education, and utilization of breathing techniques to increase stamina and strength, while also decreasing shortness of breath with exertion.     Comments: Dr. Mechele Collin is Medical Director for Pulmonary Rehab at [facility].

## 2024-01-11 ENCOUNTER — Ambulatory Visit (INDEPENDENT_AMBULATORY_CARE_PROVIDER_SITE_OTHER)

## 2024-01-11 ENCOUNTER — Encounter (HOSPITAL_COMMUNITY)
Admission: RE | Admit: 2024-01-11 | Discharge: 2024-01-11 | Disposition: A | Discharge status: Home / Self Care | Source: Ambulatory Visit | Attending: Pulmonary Disease | Admitting: Pulmonary Disease

## 2024-01-11 ENCOUNTER — Encounter: Payer: Self-pay | Admitting: Internal Medicine

## 2024-01-11 ENCOUNTER — Ambulatory Visit: Admitting: Internal Medicine

## 2024-01-11 VITALS — BP 100/62 | HR 84 | Temp 98.8°F | Ht 68.0 in | Wt 119.4 lb

## 2024-01-11 DIAGNOSIS — J9612 Chronic respiratory failure with hypercapnia: Secondary | ICD-10-CM | POA: Diagnosis not present

## 2024-01-11 DIAGNOSIS — J90 Pleural effusion, not elsewhere classified: Secondary | ICD-10-CM

## 2024-01-11 DIAGNOSIS — J849 Interstitial pulmonary disease, unspecified: Secondary | ICD-10-CM

## 2024-01-11 DIAGNOSIS — R0609 Other forms of dyspnea: Secondary | ICD-10-CM

## 2024-01-11 DIAGNOSIS — J9611 Chronic respiratory failure with hypoxia: Secondary | ICD-10-CM | POA: Diagnosis not present

## 2024-01-11 NOTE — Progress Notes (Signed)
 Rickey Erickson

## 2024-01-11 NOTE — Patient Instructions (Signed)
 Please remember to go to the lab department   for your tests - we will call you with the results when they are available.      Adjust 02 for saturations in lower 90s    If condition worsens go to [facility]

## 2024-01-11 NOTE — Progress Notes (Signed)
 Daily Session Note Pulmonary Rehab  Patient Details  Name: Darrold Bezek MRN: [id number] Date of Birth: September 23, 1956 Referring Provider:   [name] Pulmonary Rehab Walk Test from 09/18/2023 in [facility] for Heart, Vascular, & Lung Health  Referring Provider [name]       Encounter Date: 01/11/2024  Check In: Patient attended PR this am. Pt reported that his O2 sat has been low mid 80s. Forehead probe O2 checked at 86% at rest. 2L Lyman applied. Pt denies cough, increased SOB. Stated that he had HD yesterday and they pulled off an extra liter of fluid, dry weight down. RN spoke to patient's sister, Clydie Braun. She stated she's concerned that his CO2 is high (stated it was high at last hospitalization at [facility] (end of February), also stated he had a mild pleural effusion, did not need thoracentesis). Clydie Braun also noted pt is more tired and napping in the afternoon. RN will reach out to Dr. Judeth Horn for advice or possible appointment.

## 2024-01-11 NOTE — Progress Notes (Addendum)
 Rickey Erickson, male    DOB: 04-Apr-1956   MRN: [id number]   Brief patient profile:  81 yowm   never smoker  self referred to pulmonary clinic 01/11/2024  worse sats @ Rehab in setting of chornic pleural effusions monitored at [facility] and [facility] for chonic hypercarbic resp failure   on HD MWF     History of Present Illness  01/11/2024  Pulmonary/ 1st office eval/Kepler Mccabe - no resp rx x for 02 ad [facility]  Chief Complaint  Patient presents with   Acute Visit  Dyspnea:  minimal increase over baseline - actually doing more in PT  than previously when he didn't need 02 / comfortable at rest Cough: min mucoid, no dyphagia/ ST, fever / cp  Sleep: ok flat bed / one pillow / [facility] tol well  SABA use: none  02 use:2lpm     No obvious day to day or daytime pattern/variability or assoc excess/ purulent sputum or mucus plugs or hemoptysis or cp or chest tightness, subjective wheeze or overt  hb symptoms.    Also denies any obvious fluctuation of symptoms with weather or environmental changes or other aggravating or alleviating factors except as outlined above   No unusual exposure hx or h/o childhood pna/ asthma or knowledge of premature birth.  Current Allergies, Complete Past Medical History, Past Surgical History, Family History, and Social History were reviewed in [employer] record.  ROS  The following are not active complaints unless bolded Hoarseness, sore throat, dysphagia, dental problems, itching, sneezing,  nasal congestion or discharge of excess mucus or purulent secretions, ear ache,   fever, chills, sweats, unintended wt loss or wt gain, classically pleuritic or exertional cp,  orthopnea pnd or arm/hand swelling  or leg swelling, presyncope, palpitations, abdominal pain, anorexia, nausea, vomiting, diarrhea  or change in bowel habits or change in bladder habits, change in stools or change in urine, dysuria, hematuria,  rash, arthralgias, visual complaints, headache, numbness,  weakness or ataxia or problems with walking or coordination,  change in mood or  memory.             Outpatient Medications Prior to Visit  Medication Sig Dispense Refill   acetaminophen (TYLENOL) 500 MG tablet Take 500 mg by mouth every 6 (six) hours as needed for headache (pain).     albumin human 25 % bottle Inject 100 mLs (25 g total) into the vein as needed (Give during dialysis for SBP <100.).     amLODipine (NORVASC) 10 MG tablet Take 10 mg by mouth daily.     apixaban (ELIQUIS) 2.5 MG TABS tablet Take 2.5 mg by mouth 2 (two) times daily.     atorvastatin (LIPITOR) 20 MG tablet Take 20 mg by mouth every morning.     CALCIUM PO Take 1 tablet by mouth 2 (two) times daily.     clopidogrel (PLAVIX) 75 MG tablet Take 75 mg by mouth daily.     Darbepoetin Alfa (ARANESP) 150 MCG/0.3ML SOSY injection Inject 0.3 mLs (150 mcg total) into the skin every Wednesday at 6 PM.     midodrine (PROAMATINE) 10 MG tablet Take 10 mg by mouth See admin instructions. Take 10 mg once daily prior to dialysis on Monday, Wednesday, Friday.     mirtazapine (REMERON) 30 MG tablet Take 30 mg by mouth at bedtime.     pantoprazole (PROTONIX) 40 MG tablet Take 40 mg by mouth every morning.     tacrolimus (PROGRAF) 1 MG capsule Take 1  mg by mouth 2 (two) times daily. Take in addition to 5 mg capsule twice daily, for a total dose of 6 mg twice daily.     tacrolimus (PROGRAF) 5 MG capsule Take 5 mg by mouth 2 (two) times daily. Take in addition to 1 mg capsule twice daily, for a total dose of 6 mg twice daily.     No facility-administered medications prior to visit.    Past Medical History:  Diagnosis Date   Anemia    Heart failure (HCC)    Hypertension    Osteoporosis    Renal disorder       Objective:     BP 100/62 (BP Location: Left Arm, Patient Position: Sitting, Cuff Size: Small)   Pulse 84   Temp 98.8 F (37.1 C) (Temporal)   Ht 5\' 8"  (1.727 m)   Wt 119 lb 6.4 oz (54.2 kg)   SpO2 99% Comment: 2L  of oxygen  BMI 18.15 kg/m   SpO2: 99 % (2L of oxygen)  Somber amb wm nad    HEENT : Oropharynx  clear       NECK :  without  apparent JVD/ palpable Nodes/TM    LUNGS: no acc muscle use,  Nl contour chest with decreased bs both bases   CV:  RRR  no s3 or murmur or increase in P2, and no edema   ABD:  soft and nontender   MS:    ext warm without deformities Or obvious joint restrictions  calf tenderness, cyanosis or clubbing    SKIN: warm and dry without lesions    NEURO:  alert, approp, nl sensorium with  no motor or cerebellar deficits apparent.    CXR PA and Lateral:   01/11/2024 :    I personally reviewed images and agree with radiology impression as follows:    Similar-appearing small bilateral effusions and underlying opacities.     Assessment   Chronic respiratory failure with hypoxia and hypercapnia (HCC) Now requiring 02 with  Physical therapy but comfortable at rest with sat 99% in setting of tendendcy to hypercarbia at rest and nocturnally but no other clinical or radiographic changes  Given his tendency to C02 retention I have advised titrating to low 90s daytime and monitoring sats closely over the the next few days.  For any abrupt increase in dyspnea or inability to maintain sats in low 90s with 02 titration then should return to [facility] asap  F/u here per Dr Judeth Horn prn          Chronic bilateral pleural effusions No change on cxr 01/11/2024   No need for additional w/u or rx at this point but:  NB  Note that pleural effusion and copd have the same effect on insp muscles/mechanics (both shorten their length prior to inspiration making them weaker with less force reserve) so they contribute in a similar fashion to sense of sob and hypercarbia concerns.       Each maintenance medication was reviewed in detail including emphasizing most importantly the difference between maintenance and prns and under what circumstances the prns are to be triggered  using an action plan format where appropriate.  Total time for H and P, chart review, counseling, reviewing 02 /pulse ox device(s) and generating customized AVS unique to this office visit / same day charting = 27 min with pt new to me       Sandrea Hughs, MD 01/11/2024

## 2024-01-11 NOTE — Progress Notes (Signed)
 Daily Session Note  Patient Details  Name: Rickey Erickson MRN: [id number] Date of Birth: [birth year] Referring Provider:   [name] Pulmonary Rehab Walk Test from 09/18/2023 in [facility] for Heart, Vascular, & Lung Health  Referring Provider [name]       Encounter Date: 01/11/2024  Check In:  Session Check In - 01/11/24 0347       Check-In   Supervising physician immediately available to respond to emergencies [facility] MD immediately available    Physician(s) Reather Littler, NP    Location [facility]    Staff Present Raford Pitcher, MS, ACSM-CEP, Exercise Physiologist;Brandon Wiechman Erin Sons BS, ACSM-CEP, Exercise Physiologist;Mary Gerre Scull, RN, BSN    Virtual Visit No    Medication changes reported     No    Fall or balance concerns reported    No    Tobacco Cessation No Change    Warm-up and Cool-down Performed as group-led instruction    Resistance Training Performed Yes    VAD Patient? No    PAD/SET Patient? No      Pain Assessment   Currently in Pain? No/denies    Multiple Pain Sites No             Capillary Blood Glucose: No results found for this or any previous visit (from the past 24 hours).    Social History   Tobacco Use  Smoking Status Never  Smokeless Tobacco Never    Goals Met:  Proper associated with RPD/PD & O2 Sat Independence with exercise equipment Exercise tolerated well No report of concerns or symptoms today Strength training completed today  Goals Unmet:  Not Applicable  Comments: Service time is from 0814 to 0931.    Dr. Mechele Collin is Medical Director for Pulmonary Rehab at [facility].

## 2024-01-12 ENCOUNTER — Telehealth (HOSPITAL_COMMUNITY): Payer: Self-pay

## 2024-01-12 NOTE — Telephone Encounter (Addendum)
 Returned [employer] (Skip's sister) regarding yesterday's Pulm Rehab session. Awaiting call back.    Clydie Braun returned call. Updated RN on visit with Dr. Sherene Sires yesterday. She will continue to monitor him at home. He will hopefully return to therapy on Tuesday

## 2024-01-12 NOTE — Assessment & Plan Note (Addendum)
 Now requiring 02 with  Physical therapy but comfortable at rest with sat 99% in setting of tendendcy to hypercarbia at rest and nocturnally but no other clinical or radiographic changes  Given his tendency to C02 retention I have advised titrating to low 90s daytime and monitoring sats closely over the the next few days.  For any abrupt increase in dyspnea or inability to maintain sats in low 90s with 02 titration then should return to [facility] asap  F/u here per Dr Judeth Horn prn

## 2024-01-13 NOTE — Assessment & Plan Note (Addendum)
 No change on cxr 01/11/2024   No need for additional w/u or rx at this point but:  NB  Note that pleural effusion and copd have the same effect on insp muscles/mechanics (both shorten their length prior to inspiration making them weaker with less force reserve) so they contribute in a similar fashion to sense of sob and hypercarbia concerns.       Each maintenance medication was reviewed in detail including emphasizing most importantly the difference between maintenance and prns and under what circumstances the prns are to be triggered using an action plan format where appropriate.  Total time for H and P, chart review, counseling, reviewing 02 /pulse ox device(s) and generating customized AVS unique to this office visit / same day charting = 27 min with pt new to me

## 2024-01-15 ENCOUNTER — Telehealth: Payer: Self-pay | Admitting: Pulmonary Disease

## 2024-01-15 ENCOUNTER — Encounter: Payer: Self-pay | Admitting: Pulmonary Disease

## 2024-01-15 ENCOUNTER — Ambulatory Visit
Admit: 2024-01-15 | Discharge: 2024-01-20 | Disposition: A | Discharge status: Home / Self Care | Source: Ambulatory Visit | Admitting: Cardiovascular Disease

## 2024-01-15 ENCOUNTER — Inpatient Hospital Stay
Admit: 2024-01-15 | Discharge: 2024-01-20 | Disposition: A | Discharge status: Home / Self Care | Source: Ambulatory Visit | Admitting: Cardiovascular Disease

## 2024-01-15 ENCOUNTER — Ambulatory Visit: Admit: 2024-01-15 | Discharge: 2024-01-20

## 2024-01-15 NOTE — Telephone Encounter (Signed)
 Called Rickey Erickson and advised her that we are unable to speak with her as she is not listed on DPR.   Lm for patient.

## 2024-01-15 NOTE — Telephone Encounter (Signed)
 Patient's Sister (does not have a DPR) wants someone to look at her MyChart message. His oxygen drops at night.

## 2024-01-15 NOTE — Telephone Encounter (Signed)
 Tried Paediatric nurse. Sister would like to get the call since he is in dialysis.

## 2024-01-16 ENCOUNTER — Encounter (HOSPITAL_COMMUNITY): Admission: RE | Admit: 2024-01-16 | Source: Ambulatory Visit

## 2024-01-16 ENCOUNTER — Telehealth (HOSPITAL_COMMUNITY): Payer: Self-pay | Admitting: *Deleted

## 2024-01-16 NOTE — Telephone Encounter (Signed)
 Lm x2 for pt.  Mychart message sent.

## 2024-01-16 NOTE — Telephone Encounter (Signed)
 Pt called to report he is in the hospital and will not attend PR today. Will cancel appt. Ethelda Chick BS, ACSM-CEP 01/16/2024 7:51 AM

## 2024-01-18 ENCOUNTER — Encounter (HOSPITAL_COMMUNITY)

## 2024-01-18 ENCOUNTER — Telehealth (HOSPITAL_COMMUNITY): Payer: Self-pay | Admitting: *Deleted

## 2024-01-18 NOTE — Telephone Encounter (Signed)
 Skip is still in the hospital, Clydie Braun called to report.  Ethelda Chick BS, ACSM-CEP 01/18/2024 9:48 AM

## 2024-01-18 NOTE — Telephone Encounter (Unsigned)
 Copied from CRM [id number]. Topic: Clinical - Lab/Test Results >> Jan 18, 2024  1:05 PM Chantha C wrote: Reason for CRM: Patient is returning the office call, unsure what it's regarding. Informed patient, 01/11/24 DG Chest 2 view results. Patient verbalized understanding and denies any other concerns. >> Jan 18, 2024  1:06 PM Chantha C wrote: Lorain Childes, results have been relayed and patient has no further questions.

## 2024-01-21 DIAGNOSIS — Z941 Heart transplant status: Principal | ICD-10-CM

## 2024-01-21 MED ORDER — TACROLIMUS 5 MG CAPSULE, IMMEDIATE-RELEASE
ORAL_CAPSULE | 0 refills | 0 days
Start: 2024-01-21 — End: ?

## 2024-01-22 DIAGNOSIS — Z79899 Other long term (current) drug therapy: Principal | ICD-10-CM

## 2024-01-22 DIAGNOSIS — Z941 Heart transplant status: Principal | ICD-10-CM

## 2024-01-22 MED ORDER — TACROLIMUS 5 MG CAPSULE, IMMEDIATE-RELEASE
ORAL_CAPSULE | 11 refills | 0 days | Status: CP
Start: 2024-01-22 — End: ?

## 2024-01-23 ENCOUNTER — Encounter (HOSPITAL_COMMUNITY)
Admission: RE | Admit: 2024-01-23 | Discharge: 2024-01-23 | Disposition: A | Discharge status: Home / Self Care | Source: Ambulatory Visit | Attending: Pulmonary Disease | Admitting: Pulmonary Disease

## 2024-01-23 VITALS — Wt 120.4 lb

## 2024-01-23 DIAGNOSIS — J849 Interstitial pulmonary disease, unspecified: Secondary | ICD-10-CM | POA: Diagnosis present

## 2024-01-23 NOTE — Progress Notes (Signed)
 Daily Session Note  Patient Details  Name: Rickey Erickson MRN: [id number] Date of Birth: [birth year] Referring Provider:   [name] Pulmonary Rehab Walk Test from 09/18/2023 in [facility] for Heart, Vascular, & Lung Health  Referring Provider [name]       Encounter Date: 01/23/2024  Check In:  Session Check In - 01/23/24 7829       Check-In   Supervising physician immediately available to respond to emergencies [facility] MD immediately available    Physician(s) Jari Favre, NP    Location [facility]    Staff Present Raford Pitcher, MS, ACSM-CEP, Exercise Physiologist;Leniyah Martell Erin Sons BS, ACSM-CEP, Exercise Physiologist;Mary Gerre Scull, RN, BSN    Virtual Visit No    Medication changes reported     No    Fall or balance concerns reported    No    Tobacco Cessation No Change    Warm-up and Cool-down Performed as group-led instruction    Resistance Training Performed Yes    VAD Patient? No    PAD/SET Patient? No      Pain Assessment   Currently in Pain? No/denies    Multiple Pain Sites No             Capillary Blood Glucose: No results found for this or any previous visit (from the past 24 hours).   Exercise Prescription Changes - 01/23/24 0900       Response to Exercise   Blood Pressure (Admit) 118/62    Blood Pressure (Exercise) 152/60    Blood Pressure (Exit) 116/60    Heart Rate (Admit) 113 bpm    Heart Rate (Exercise) 124 bpm    Heart Rate (Exit) 113 bpm    Oxygen Saturation (Admit) 100 %    Oxygen Saturation (Exercise) 100 %    Oxygen Saturation (Exit) 100 %    Rating of Perceived Exertion (Exercise) 15    Perceived Dyspnea (Exercise) 3    Duration Continue with 30 min of aerobic exercise without signs/symptoms of physical distress.    Intensity THRR unchanged      Progression   Progression Continue to progress workloads to maintain intensity without signs/symptoms of physical distress.      Resistance  Training   Training Prescription Yes    Weight red bands    Reps 10-15    Time 10 Minutes      Oxygen   Oxygen Continuous    Liters 2      Recumbant Bike   Level 2    Minutes 15    METs 2.5      Track   Laps 5.5    Minutes 15    METs 1.82      Oxygen   Maintain Oxygen Saturation 88% or higher             Social History   Tobacco Use  Smoking Status Never  Smokeless Tobacco Never    Goals Met:  Proper associated with RPD/PD & O2 Sat Independence with exercise equipment Exercise tolerated well No report of concerns or symptoms today Strength training completed today  Goals Unmet:  Not Applicable  Comments: Service time is from [phone] to [phone].    Dr. Mechele Collin is Medical Director for Pulmonary Rehab at [facility].

## 2024-01-24 ENCOUNTER — Telehealth (HOSPITAL_COMMUNITY): Payer: Self-pay

## 2024-01-24 NOTE — Telephone Encounter (Signed)
 Called pt to notify him of 8:30 start time for PR tomorrow. Pt voiced understanding.

## 2024-01-25 ENCOUNTER — Telehealth: Payer: Self-pay | Admitting: Internal Medicine

## 2024-01-25 ENCOUNTER — Encounter (HOSPITAL_COMMUNITY)
Admission: RE | Admit: 2024-01-25 | Discharge: 2024-01-25 | Disposition: A | Discharge status: Home / Self Care | Source: Ambulatory Visit | Attending: Pulmonary Disease | Admitting: Pulmonary Disease

## 2024-01-25 DIAGNOSIS — J849 Interstitial pulmonary disease, unspecified: Secondary | ICD-10-CM

## 2024-01-25 NOTE — Progress Notes (Signed)
 Daily Session Note  Patient Details  Name: Rickey Erickson MRN: [id number] Date of Birth: [birth year] Referring Provider:   [name] Pulmonary Rehab Walk Test from 09/18/2023 in [facility]  Referring Provider [name]       Encounter Date: 01/25/2024  Check In:  Session Check In - 01/25/24 0916       Check-In   Supervising physician immediately available to respond to emergencies [facility] MD immediately available    Physician(s) Carlos Levering, NP    Location [facility]    Staff Present Raford Pitcher, MS, ACSM-CEP, Exercise Physiologist;Casey Erin Sons BS, ACSM-CEP, Exercise Physiologist;Mary Gerre Scull, RN, BSN    Virtual Visit No    Medication changes reported     No    Fall or balance concerns reported    No    Tobacco Cessation No Change    Warm-up and Cool-down Performed as group-led instruction    Resistance Training Performed Yes    VAD Patient? No    PAD/SET Patient? No      Pain Assessment   Currently in Pain? No/denies    Multiple Pain Sites No             Capillary Blood Glucose: No results found for this or any previous visit (from the past 24 hours).    Social History   Tobacco Use  Smoking Status Never  Smokeless Tobacco Never    Goals Met:  Proper associated with RPD/PD & O2 Sat Independence with exercise equipment Exercise tolerated well No report of concerns or symptoms today Strength training completed today  Goals Unmet:  Not Applicable  Comments: Service time is from 0838 to 0950.    Dr. Mechele Collin is Medical Director for Pulmonary Rehab at [facility].

## 2024-01-25 NOTE — Telephone Encounter (Signed)
 Attempts made to reach patient x6. Letter sent re:CXR results. [facility] message also sent.

## 2024-01-25 NOTE — Telephone Encounter (Signed)
 Please call back PT w/results. Thanks.

## 2024-01-30 ENCOUNTER — Encounter (HOSPITAL_COMMUNITY)
Admission: RE | Admit: 2024-01-30 | Discharge: 2024-01-30 | Disposition: A | Discharge status: Home / Self Care | Source: Ambulatory Visit | Attending: Pulmonary Disease

## 2024-01-30 VITALS — Wt 120.6 lb

## 2024-01-30 DIAGNOSIS — J849 Interstitial pulmonary disease, unspecified: Secondary | ICD-10-CM | POA: Diagnosis not present

## 2024-01-30 NOTE — Progress Notes (Signed)
 Daily Session Note  Patient Details  Name: Rickey Erickson MRN: [id number] Date of Birth: [birth year] Referring Provider:   [name] Pulmonary Rehab Walk Test from 09/18/2023 in [facility]  Referring Provider [name]       Encounter Date: 01/30/2024  Check In:  Session Check In - 01/30/24 0827       Check-In   Supervising physician immediately available to respond to emergencies [facility] MD immediately available    Physician(s) Rise Paganini, NP    Location [facility]    Staff Present Raford Pitcher, MS, ACSM-CEP, Exercise Physiologist;Antasia Haider Erin Sons BS, ACSM-CEP, Exercise Physiologist;Mary Gerre Scull, RN, BSN    Virtual Visit No    Medication changes reported     No    Fall or balance concerns reported    No    Tobacco Cessation No Change    Warm-up and Cool-down Performed as group-led instruction    Resistance Training Performed Yes    VAD Patient? No    PAD/SET Patient? No      Pain Assessment   Currently in Pain? No/denies    Multiple Pain Sites No             Capillary Blood Glucose: No results found for this or any previous visit (from the past 24 hours).    Social History   Tobacco Use  Smoking Status Never  Smokeless Tobacco Never    Goals Met:  Proper associated with RPD/PD & O2 Sat Independence with exercise equipment Exercise tolerated well No report of concerns or symptoms today Strength training completed today  Goals Unmet:  Not Applicable  Comments: Service time is from 0811 to 0924.    Dr. Mechele Collin is Medical Director for Pulmonary Rehab at [facility].

## 2024-02-01 ENCOUNTER — Telehealth (HOSPITAL_COMMUNITY): Payer: Self-pay | Admitting: *Deleted

## 2024-02-01 ENCOUNTER — Encounter (HOSPITAL_COMMUNITY)
Admission: RE | Admit: 2024-02-01 | Discharge: 2024-02-01 | Disposition: A | Discharge status: Home / Self Care | Source: Ambulatory Visit | Attending: Pulmonary Disease | Admitting: Pulmonary Disease

## 2024-02-01 NOTE — Telephone Encounter (Signed)
 Skip called out of class today due to a dialysis issue. Will cancel appt.  Ethelda Chick BS, ACSM-CEP 02/01/2024 7:47 AM

## 2024-02-04 ENCOUNTER — Ambulatory Visit
Admit: 2024-02-04 | Discharge: 2024-02-10 | Disposition: A | Discharge status: Home / Self Care | Payer: MEDICARE | Admitting: Cardiovascular Disease

## 2024-02-04 ENCOUNTER — Ambulatory Visit: Admit: 2024-02-04 | Discharge: 2024-02-10 | Payer: MEDICARE

## 2024-02-04 ENCOUNTER — Inpatient Hospital Stay
Admit: 2024-02-04 | Discharge: 2024-02-10 | Disposition: A | Discharge status: Home / Self Care | Payer: MEDICARE | Admitting: Cardiovascular Disease

## 2024-02-05 ENCOUNTER — Telehealth (HOSPITAL_COMMUNITY): Payer: Self-pay

## 2024-02-05 NOTE — Telephone Encounter (Signed)
 Patient's sister called to state that Parke is at [facility] due to issues with dialysis and will not be in for class tomorrow. She will call back to let us  know if he will be able to attend Thursday class.

## 2024-02-06 ENCOUNTER — Encounter (HOSPITAL_COMMUNITY)

## 2024-02-07 ENCOUNTER — Telehealth (HOSPITAL_COMMUNITY): Payer: Self-pay

## 2024-02-07 NOTE — Progress Notes (Signed)
 Pulmonary Individual Treatment Plan  Patient Details  Name: Rickey Erickson MRN: [id number] Date of Birth: Aug 04, 1956 Referring Provider:   Gattis Kass Pulmonary Rehab Walk Test from 09/18/2023 in [facility]  Referring Provider Pierce Brewster       Initial Encounter Date:  Flowsheet Row Pulmonary Rehab Walk Test from 09/18/2023 in [facility]  Date 09/18/23       Visit Diagnosis: ILD (interstitial lung disease) (HCC)  Patient's Home Medications on Admission:   Current Outpatient Medications:    acetaminophen (TYLENOL) 500 MG tablet, Take 500 mg by mouth every 6 (six) hours as needed for headache (pain)., Disp: , Rfl:    albumin human 25 % bottle, Inject 100 mLs (25 g total) into the vein as needed (Give during dialysis for SBP <100.)., Disp: , Rfl:    amLODipine (NORVASC) 10 MG tablet, Take 10 mg by mouth daily., Disp: , Rfl:    apixaban (ELIQUIS) 2.5 MG TABS tablet, Take 2.5 mg by mouth 2 (two) times daily., Disp: , Rfl:    atorvastatin (LIPITOR) 20 MG tablet, Take 20 mg by mouth every morning., Disp: , Rfl:    CALCIUM PO, Take 1 tablet by mouth 2 (two) times daily., Disp: , Rfl:    clopidogrel (PLAVIX) 75 MG tablet, Take 75 mg by mouth daily., Disp: , Rfl:    Darbepoetin Alfa (ARANESP) 150 MCG/0.3ML SOSY injection, Inject 0.3 mLs (150 mcg total) into the skin every Wednesday at 6 PM., Disp: , Rfl:    midodrine (PROAMATINE) 10 MG tablet, Take 10 mg by mouth See admin instructions. Take 10 mg once daily prior to dialysis on Monday, Wednesday, Friday., Disp: , Rfl:    mirtazapine (REMERON) 30 MG tablet, Take 30 mg by mouth at bedtime., Disp: , Rfl:    pantoprazole (PROTONIX) 40 MG tablet, Take 40 mg by mouth every morning., Disp: , Rfl:    tacrolimus (PROGRAF) 1 MG capsule, Take 1 mg by mouth 2 (two) times daily. Take in addition to 5 mg capsule twice daily, for a total dose  of 6 mg twice daily., Disp: , Rfl:    tacrolimus (PROGRAF) 5 MG capsule, Take 5 mg by mouth 2 (two) times daily. Take in addition to 1 mg capsule twice daily, for a total dose of 6 mg twice daily., Disp: , Rfl:   Past Medical History: Past Medical History:  Diagnosis Date   Anemia    Heart failure (HCC)    Hypertension    Osteoporosis    Renal disorder     Tobacco Use: Social History   Tobacco Use  Smoking Status Never  Smokeless Tobacco Never    Labs: Review Flowsheet  More data may exist      Latest Ref Rng & Units 01/06/2022 09/08/2022 03/07/2023 07/21/2023 07/26/2023  Labs for ITP Cardiac and Pulmonary Rehab  Cholestrol 0 - 200 mg/dL 409  811  - - -  LDL (calc) 0 - 99 mg/dL 90  80  - - -  HDL-C >91 mg/dL 30  39  - - -  Trlycerides <150 mg/dL 90  86  - - -  Hemoglobin A1c 4.8 - 5.6 % 4.9  - - - -  PH, Arterial 7.35 - 7.45 - - 7.497  7.227  7.109  7.060  -  PCO2 arterial 32 - 48 mmHg - - 33.6  56.0  85.7  107.3  -  Bicarbonate 20.0 - 28.0 mmol/L - - 26.0  23.3  27.6  25.0  27.4  30.5  23.9  25.0   TCO2 22 - 32 mmol/L - - 27  25  30  27  30   34  -  Acid-base deficit 0.0 - 2.0 mmol/L - - - 5.0  4.0  6.0  4.0  3.0  6.0  4.7   O2 Saturation % - - 96  93  30  75  86  92  98.2  77.3     Details       Multiple values from one day are sorted in reverse-chronological order         Capillary Blood Glucose: Lab Results  Component Value Date   GLUCAP 80 07/27/2023   GLUCAP 92 07/21/2023   GLUCAP 86 07/21/2023     Pulmonary Assessment Scores:  Pulmonary Assessment Scores     Row Name 09/18/23 0812         ADL UCSD   ADL Phase Entry       mMRC Score   mMRC Score 2             UCSD: Self-administered rating of dyspnea associated with activities of daily living (ADLs) 6-point scale (0 = "not at all" to 5 = "maximal or unable to do because of breathlessness")  Scoring Scores range from 0 to 120.  Minimally important difference is 5 units  CAT: CAT can  identify the health impairment of COPD patients and is better correlated with disease progression.  CAT has a scoring range of zero to 40. The CAT score is classified into four groups of low (less than 10), medium (10 - 20), high (21-30) and very high (31-40) based on the impact level of disease on health status. A CAT score over 10 suggests significant symptoms.  A worsening CAT score could be explained by an exacerbation, poor medication adherence, poor inhaler technique, or progression of COPD or comorbid conditions.  CAT MCID is 2 points  mMRC: mMRC (Modified Medical Research Council) Dyspnea Scale is used to assess the degree of baseline functional disability in patients of respiratory disease due to dyspnea. No minimal important difference is established. A decrease in score of 1 point or greater is considered a positive change.   Pulmonary Function Assessment:  Pulmonary Function Assessment - 09/18/23 0812       Breath   Bilateral Breath Sounds Decreased    Shortness of Breath Yes;Limiting activity             Exercise Target Goals: Exercise Program Goal: Individual exercise prescription set using results from initial 6 min walk test and THRR while considering  patient's activity barriers and safety.   Exercise Prescription Goal: Initial exercise prescription builds to 30-45 minutes a day of aerobic activity, 2-3 days per week.  Home exercise guidelines will be given to patient during program as part of exercise prescription that the participant will acknowledge.  Activity Barriers & Risk Stratification:   6 Minute Walk:  6 Minute Walk     Row Name 09/18/23 0906         6 Minute Walk   Phase Initial     Distance 1140 feet     Walk Time 6 minutes     # of Rest Breaks 0     MPH 2.16     METS 3.68     RPE 13     Perceived Dyspnea  2  VO2 Peak 12.87     Symptoms No     Resting HR 114 bpm     Resting BP 130/68     Resting Oxygen Saturation  95 %     Exercise  Oxygen Saturation  during 6 min walk 92 %     Max Ex. HR 131 bpm     Max Ex. BP 134/68     2 Minute Post BP 122/70       Interval HR   1 Minute HR 122     2 Minute HR 128     3 Minute HR 129     4 Minute HR 128     5 Minute HR 131     6 Minute HR 131     2 Minute Post HR 119     Interval Heart Rate? Yes       Interval Oxygen   Interval Oxygen? Yes     Baseline Oxygen Saturation % 95 %     1 Minute Oxygen Saturation % 94 %     1 Minute Liters of Oxygen 0 L     2 Minute Oxygen Saturation % 94 %     2 Minute Liters of Oxygen 0 L     3 Minute Oxygen Saturation % 92 %     3 Minute Liters of Oxygen 0 L     4 Minute Oxygen Saturation % 92 %     4 Minute Liters of Oxygen 0 L     5 Minute Oxygen Saturation % 93 %     5 Minute Liters of Oxygen 0 L     6 Minute Oxygen Saturation % 92 %     6 Minute Liters of Oxygen 0 L     2 Minute Post Oxygen Saturation % 95 %     2 Minute Post Liters of Oxygen 0 L              Oxygen Initial Assessment:  Oxygen Initial Assessment - 01/29/24 0946       Home Oxygen   Home Oxygen Device Home Concentrator;E-Tanks   Has PRN     Initial 6 min Walk   Oxygen Used None      Program Oxygen Prescription   Program Oxygen Prescription Continuous    Liters per minute 2    Comments using PRN             Oxygen Re-Evaluation:  Oxygen Re-Evaluation     Row Name 10/05/23 1622 11/06/23 0857 12/04/23 0919 01/03/24 0929 01/29/24 0946     Program Oxygen Prescription   Program Oxygen Prescription None None None None --     Home Oxygen   Home Oxygen Device None None None None --   Sleep Oxygen Prescription CPAP CPAP CPAP CPAP CPAP   Liters per minute 2 2 2 2 2    Home Exercise Oxygen Prescription None None None None None   Home Resting Oxygen Prescription None None None None None   Compliance with Home Oxygen Use Yes Yes Yes Yes Yes     Goals/Expected Outcomes   Short Term Goals To learn and exhibit compliance with exercise, home and travel  O2 prescription;To learn and understand importance of maintaining oxygen saturations>88%;To learn and demonstrate proper use of respiratory medications;To learn and understand importance of monitoring SPO2 with pulse oximeter and demonstrate accurate use of the pulse oximeter.;To learn and demonstrate proper pursed lip breathing techniques or other breathing techniques.  To learn  and exhibit compliance with exercise, home and travel O2 prescription;To learn and understand importance of maintaining oxygen saturations>88%;To learn and demonstrate proper use of respiratory medications;To learn and understand importance of monitoring SPO2 with pulse oximeter and demonstrate accurate use of the pulse oximeter.;To learn and demonstrate proper pursed lip breathing techniques or other breathing techniques.  To learn and exhibit compliance with exercise, home and travel O2 prescription;To learn and understand importance of maintaining oxygen saturations>88%;To learn and demonstrate proper use of respiratory medications;To learn and understand importance of monitoring SPO2 with pulse oximeter and demonstrate accurate use of the pulse oximeter.;To learn and demonstrate proper pursed lip breathing techniques or other breathing techniques.  To learn and exhibit compliance with exercise, home and travel O2 prescription;To learn and understand importance of maintaining oxygen saturations>88%;To learn and demonstrate proper use of respiratory medications;To learn and understand importance of monitoring SPO2 with pulse oximeter and demonstrate accurate use of the pulse oximeter.;To learn and demonstrate proper pursed lip breathing techniques or other breathing techniques.  To learn and exhibit compliance with exercise, home and travel O2 prescription;To learn and understand importance of maintaining oxygen saturations>88%;To learn and demonstrate proper use of respiratory medications;To learn and understand importance of monitoring  SPO2 with pulse oximeter and demonstrate accurate use of the pulse oximeter.;To learn and demonstrate proper pursed lip breathing techniques or other breathing techniques.    Long  Term Goals Exhibits compliance with exercise, home  and travel O2 prescription;Verbalizes importance of monitoring SPO2 with pulse oximeter and return demonstration;Exhibits proper breathing techniques, such as pursed lip breathing or other method taught during program session;Maintenance of O2 saturations>88%;Compliance with respiratory medication Exhibits compliance with exercise, home  and travel O2 prescription;Verbalizes importance of monitoring SPO2 with pulse oximeter and return demonstration;Exhibits proper breathing techniques, such as pursed lip breathing or other method taught during program session;Maintenance of O2 saturations>88%;Compliance with respiratory medication Exhibits compliance with exercise, home  and travel O2 prescription;Verbalizes importance of monitoring SPO2 with pulse oximeter and return demonstration;Exhibits proper breathing techniques, such as pursed lip breathing or other method taught during program session;Maintenance of O2 saturations>88%;Compliance with respiratory medication Exhibits compliance with exercise, home  and travel O2 prescription;Verbalizes importance of monitoring SPO2 with pulse oximeter and return demonstration;Exhibits proper breathing techniques, such as pursed lip breathing or other method taught during program session;Maintenance of O2 saturations>88%;Compliance with respiratory medication Exhibits compliance with exercise, home  and travel O2 prescription;Verbalizes importance of monitoring SPO2 with pulse oximeter and return demonstration;Exhibits proper breathing techniques, such as pursed lip breathing or other method taught during program session;Maintenance of O2 saturations>88%;Compliance with respiratory medication   Goals/Expected Outcomes Compliance and understanding  of oxygen saturation monitoring and breathing techniques to decrease shortness of breath. Compliance and understanding of oxygen saturation monitoring and breathing techniques to decrease shortness of breath. Compliance and understanding of oxygen saturation monitoring and breathing techniques to decrease shortness of breath. Compliance and understanding of oxygen saturation monitoring and breathing techniques to decrease shortness of breath. Compliance and understanding of oxygen saturation monitoring and breathing techniques to decrease shortness of breath.            Oxygen Discharge (Final Oxygen Re-Evaluation):  Oxygen Re-Evaluation - 01/29/24 0946       Home Oxygen   Sleep Oxygen Prescription CPAP    Liters per minute 2    Home Exercise Oxygen Prescription None    Home Resting Oxygen Prescription None    Compliance with Home Oxygen Use Yes      Goals/Expected  Outcomes   Short Term Goals To learn and exhibit compliance with exercise, home and travel O2 prescription;To learn and understand importance of maintaining oxygen saturations>88%;To learn and demonstrate proper use of respiratory medications;To learn and understand importance of monitoring SPO2 with pulse oximeter and demonstrate accurate use of the pulse oximeter.;To learn and demonstrate proper pursed lip breathing techniques or other breathing techniques.     Long  Term Goals Exhibits compliance with exercise, home  and travel O2 prescription;Verbalizes importance of monitoring SPO2 with pulse oximeter and return demonstration;Exhibits proper breathing techniques, such as pursed lip breathing or other method taught during program session;Maintenance of O2 saturations>88%;Compliance with respiratory medication    Goals/Expected Outcomes Compliance and understanding of oxygen saturation monitoring and breathing techniques to decrease shortness of breath.             Initial Exercise Prescription:  Initial Exercise  Prescription - 09/18/23 0900       Date of Initial Exercise RX and Referring Provider   Date 09/18/23    Referring Provider [name]    Expected Discharge Date 12/14/23      Oxygen   Maintain Oxygen Saturation 88% or higher      Recumbant Bike   Level 1    Minutes 15    METs 2.5      Track   Minutes 15    METs 2.5      Intensity   THRR 40-80% of Max Heartrate 62-124    Ratings of Perceived Exertion 11-13    Perceived Dyspnea 0-4      Progression   Progression Continue to progress workloads to maintain intensity without signs/symptoms of physical distress.      Resistance Training   Training Prescription Yes    Weight red bands    Reps 10-15             Perform Capillary Blood Glucose checks as needed.  Exercise Prescription Changes:   Exercise Prescription Changes     Row Name 10/03/23 0900 10/17/23 0900 10/31/23 0900 11/14/23 0900 11/28/23 0900     Response to Exercise   Blood Pressure (Admit) 112/68 114/64 140/78 124/68 134/78   Blood Pressure (Exercise) -- 138/70 -- 158/60 156/68   Blood Pressure (Exit) 112/64 132/68 124/70 118/62 116/64   Heart Rate (Admit) 118 bpm 112 bpm 114 bpm 114 bpm 115 bpm   Heart Rate (Exercise) 131 bpm 126 bpm 131 bpm 125 bpm 124 bpm   Heart Rate (Exit) 117 bpm 115 bpm 113 bpm 112 bpm 114 bpm   Oxygen Saturation (Admit) 100 % 100 % 100 % 97 % 96 %   Oxygen Saturation (Exercise) 96 % 95 % 90 % 93 % 97 %   Oxygen Saturation (Exit) 98 % 100 % 99 % 95 % 99 %   Rating of Perceived Exertion (Exercise) 13 12 14 15 15    Perceived Dyspnea (Exercise) 3 2 3 3 3    Duration Continue with 30 min of aerobic exercise without signs/symptoms of physical distress. Continue with 30 min of aerobic exercise without signs/symptoms of physical distress. Continue with 30 min of aerobic exercise without signs/symptoms of physical distress. Continue with 30 min of aerobic exercise without signs/symptoms of physical distress. Continue with 30 min of aerobic  exercise without signs/symptoms of physical distress.   Intensity THRR New  62-150 THRR unchanged THRR unchanged THRR unchanged THRR unchanged     Progression   Progression Continue to progress workloads to maintain intensity without signs/symptoms of physical  distress. -- Continue to progress workloads to maintain intensity without signs/symptoms of physical distress. Continue to progress workloads to maintain intensity without signs/symptoms of physical distress. Continue to progress workloads to maintain intensity without signs/symptoms of physical distress.     Resistance Training   Training Prescription Yes Yes Yes Yes Yes   Weight red bands red bands red bands red bands red bands   Reps 10-15 10-15 10-15 10-15 10-15   Time 10 Minutes 10 Minutes 10 Minutes 10 Minutes 10 Minutes     Recumbant Bike   Level 2 2 3 2 3    RPM -- -- 55 57 48   Watts -- -- 21 15 17    Minutes 15 15 15 15 15    METs 2.3 2.4 2.5 2.3 2.3     Track   Laps 6.5 6.5 5 5.5 5.5   Minutes 15 15 15 15 15    METs 2 2 1.77 1.82 1.82     Oxygen   Maintain Oxygen Saturation -- -- -- 88% or higher --    Row Name 12/12/23 0900 12/19/23 0853 01/09/24 0900 01/23/24 0900 01/30/24 0901     Response to Exercise   Blood Pressure (Admit) 130/68 130/70 122/64 118/62 114/60   Blood Pressure (Exercise) 168/70 -- 146/66 152/60 --   Blood Pressure (Exit) 116/64 120/68 110/64 116/60 102/60   Heart Rate (Admit) 115 bpm 110 bpm 106 bpm 113 bpm 108 bpm   Heart Rate (Exercise) 126 bpm 125 bpm 123 bpm 124 bpm 119 bpm   Heart Rate (Exit) 114 bpm 112 bpm 107 bpm 113 bpm 108 bpm   Oxygen Saturation (Admit) 98 % 95 % 94 % 100 % 93 %   Oxygen Saturation (Exercise) 95 % 88 % 90 % 100 % 100 %   Oxygen Saturation (Exit) 99 % 99 % 90 % 100 % 97 %   Rating of Perceived Exertion (Exercise) 14 15 15 15 15    Perceived Dyspnea (Exercise) 2 3 3 3 3    Duration Continue with 30 min of aerobic exercise without signs/symptoms of physical distress.  Continue with 30 min of aerobic exercise without signs/symptoms of physical distress. Continue with 30 min of aerobic exercise without signs/symptoms of physical distress. Continue with 30 min of aerobic exercise without signs/symptoms of physical distress. Continue with 30 min of aerobic exercise without signs/symptoms of physical distress.   Intensity THRR unchanged THRR unchanged THRR New THRR unchanged THRR unchanged     Progression   Progression Continue to progress workloads to maintain intensity without signs/symptoms of physical distress. Continue to progress workloads to maintain intensity without signs/symptoms of physical distress. Continue to progress workloads to maintain intensity without signs/symptoms of physical distress. Continue to progress workloads to maintain intensity without signs/symptoms of physical distress. Continue to progress workloads to maintain intensity without signs/symptoms of physical distress.     Resistance Training   Training Prescription Yes Yes Yes Yes Yes   Weight red bands red bands red bands red bands red bands   Reps 10-15 10-15 10-15 10-15 10-15   Time 10 Minutes 10 Minutes 10 Minutes 10 Minutes 10 Minutes     Oxygen   Oxygen -- -- -- Continuous Continuous   Liters -- -- -- 2 2     Recumbant Bike   Level 3 3  3  decreased to level 2 3 2 3    RPM 51 50 47 -- --   Watts 20 19 22  -- --   Minutes 15 15 15  15 15   METs 2.5 2.4 2.6 2.5 2.8     Track   Laps 5 5 4.5 5.5 6.5   Minutes 15 15 15 15 15    METs 1.77 1.77 1.6 1.82 2     Oxygen   Maintain Oxygen Saturation -- 88% or higher -- 88% or higher --            Exercise Comments:   Exercise Comments     Row Name 09/26/23 4098           Exercise Comments Pt completed first day of group exercise. He exercised 15 min on the recumbent bike, level 2, METs 2.2. He then walked the track for 15 min, 2.0 METs, with seated breaks. Pt is feeling well and tolerated exercise better than last time  he did pulmonary rehab. He performed warm up and cool down mostly standing, including squats. Discussed METS with good reception. Pt happy to be back.                Exercise Goals and Review:   Exercise Goals     Row Name 09/18/23 0813             Exercise Goals   Increase Physical Activity Yes       Intervention Provide advice, education, support and counseling about physical activity/exercise needs.;Develop an individualized exercise prescription for aerobic and resistive training based on initial evaluation findings, risk stratification, comorbidities and participant's personal goals.       Expected Outcomes Short Term: Attend rehab on a regular basis to increase amount of physical activity.;Long Term: Exercising regularly at least 3-5 days a week.;Long Term: Add in home exercise to make exercise part of routine and to increase amount of physical activity.       Increase Strength and Stamina Yes       Intervention Provide advice, education, support and counseling about physical activity/exercise needs.;Develop an individualized exercise prescription for aerobic and resistive training based on initial evaluation findings, risk stratification, comorbidities and participant's personal goals.       Expected Outcomes Short Term: Increase workloads from initial exercise prescription for resistance, speed, and METs.;Short Term: Perform resistance training exercises routinely during rehab and add in resistance training at home;Long Term: Improve cardiorespiratory fitness, muscular endurance and strength as measured by increased METs and functional capacity ( )       Able to understand and use rate of perceived exertion (RPE) scale Yes       Intervention Provide education and explanation on how to use RPE scale       Expected Outcomes Short Term: Able to use RPE daily in rehab to express subjective intensity level;Long Term:  Able to use RPE to guide intensity level when exercising  independently       Able to understand and use Dyspnea scale Yes       Intervention Provide education and explanation on how to use Dyspnea scale       Expected Outcomes Short Term: Able to use Dyspnea scale daily in rehab to express subjective sense of shortness of breath during exertion;Long Term: Able to use Dyspnea scale to guide intensity level when exercising independently       Knowledge and understanding of Target Heart Rate Range (THRR) Yes       Intervention Provide education and explanation of THRR including how the numbers were predicted and where they are located for reference       Expected Outcomes Short Term: Able  to state/look up THRR;Long Term: Able to use THRR to govern intensity when exercising independently;Short Term: Able to use daily as guideline for intensity in rehab       Understanding of Exercise Prescription Yes       Intervention Provide education, explanation, and written materials on patient's individual exercise prescription       Expected Outcomes Short Term: Able to explain program exercise prescription;Long Term: Able to explain home exercise prescription to exercise independently                Exercise Goals Re-Evaluation :  Exercise Goals Re-Evaluation     Row Name 09/18/23 0813 10/05/23 1620 11/06/23 0855 12/04/23 0916 01/03/24 0928     Exercise Goal Re-Evaluation   Exercise Goals Review Increase Physical Activity;Able to understand and use Dyspnea scale;Understanding of Exercise Prescription;Increase Strength and Stamina;Knowledge and understanding of Target Heart Rate Range (THRR);Able to understand and use rate of perceived exertion (RPE) scale Increase Physical Activity;Able to understand and use Dyspnea scale;Understanding of Exercise Prescription;Increase Strength and Stamina;Knowledge and understanding of Target Heart Rate Range (THRR);Able to understand and use rate of perceived exertion (RPE) scale Increase Physical Activity;Able to understand  and use Dyspnea scale;Understanding of Exercise Prescription;Increase Strength and Stamina;Knowledge and understanding of Target Heart Rate Range (THRR);Able to understand and use rate of perceived exertion (RPE) scale Increase Physical Activity;Able to understand and use Dyspnea scale;Understanding of Exercise Prescription;Increase Strength and Stamina;Knowledge and understanding of Target Heart Rate Range (THRR);Able to understand and use rate of perceived exertion (RPE) scale Increase Physical Activity;Able to understand and use Dyspnea scale;Understanding of Exercise Prescription;Increase Strength and Stamina;Knowledge and understanding of Target Heart Rate Range (THRR);Able to understand and use rate of perceived exertion (RPE) scale   Comments Skip is scheduled begin exercise again on 12/3. Skip has completed 3 exercise sessions. He exercises for 15 min on the recumbent bike and track. Skip averages 2.4 METs at level 2 on the recumbent bike and 1.92 METs on the track. He performs the warmup and cooldown mostly standing dependent on his shortness of breath. It is too soon to notate any discernable differences. Will continue to monitor and progress as able. Skip has completed 9 exercise sessions. He exercises for 15 min on the recumbent bike and track. Skip averages 2.5 METs at level 3 on the recumbent bike and 1.77 METs on the track. He performs the warmup and cooldown mostly standing dependent on his shortness of breath. Skip has increased his workload for the recumbent bike as METs have slightly increased. His track METs have remained relatively the same. Skip also seems to do more of the warmup and cooldown standing. Will continue to monitor and progress as able. Skip has completed 16 exercise sessions. He exercises for 15 min on the recumbent bike and track. Skip averages 2.3 METs at level 3 on the recumbent bike and 2.81 METs on the track. He performs the warmup and cooldown mostly standing dependent on  his shortness of breath. Skip has increased his METs for the track although METs remain about the same on the recumbent bike. I have asked Skip he needed to discuss home exercise. Skip did not wish to discuss home exercise as he feels has has all the resources he needs to exercise outside of rehab. Will continue to monitor and progress as able. Skip has completed 23 exercise sessions. He exercises for 15 min on the recumbent bike and track. Skip averages 2.7 METs at level 3 on the recumbent bike  and 1.92 METs on the track. He performs the warmup and cooldown mostly standing dependent on his shortness of breath. Skip has struggled to progress lately due to a recent hospitalization. Will continue to monitor and progress as able.   Expected Outcomes Through exercise at rehab and home, the patient will decrease shortness of breath with daily activities and feel confident in carrying out an exercise regimen at home. Through exercise at rehab and home, the patient will decrease shortness of breath with daily activities and feel confident in carrying out an exercise regimen at home. Through exercise at rehab and home, the patient will decrease shortness of breath with daily activities and feel confident in carrying out an exercise regimen at home. Through exercise at rehab and home, the patient will decrease shortness of breath with daily activities and feel confident in carrying out an exercise regimen at home. Through exercise at rehab and home, the patient will decrease shortness of breath with daily activities and feel confident in carrying out an exercise regimen at home.    Row Name 01/29/24 0944             Exercise Goal Re-Evaluation   Exercise Goals Review Increase Physical Activity;Able to understand and use Dyspnea scale;Understanding of Exercise Prescription;Increase Strength and Stamina;Knowledge and understanding of Target Heart Rate Range (THRR);Able to understand and use rate of perceived exertion  (RPE) scale       Comments Skip has completed 28 exercise sessions. He exercises for 15 min on the recumbent bike and track. Skip averages 2.6 METs at level 3 on the recumbent bike and 1.92 METs on the track. He performs the warmup and cooldown mostly standing dependent on his shortness of breath. Skip's METs have remained relatively the same. He still struggles to progress due to his breathing. Skip is still very motivated to exercise. Will continue to monitor and progress as able.       Expected Outcomes Through exercise at rehab and home, the patient will decrease shortness of breath with daily activities and feel confident in carrying out an exercise regimen at home.                Discharge Exercise Prescription (Final Exercise Prescription Changes):  Exercise Prescription Changes - 01/30/24 0901       Response to Exercise   Blood Pressure (Admit) 114/60    Blood Pressure (Exit) 102/60    Heart Rate (Admit) 108 bpm    Heart Rate (Exercise) 119 bpm    Heart Rate (Exit) 108 bpm    Oxygen Saturation (Admit) 93 %    Oxygen Saturation (Exercise) 100 %    Oxygen Saturation (Exit) 97 %    Rating of Perceived Exertion (Exercise) 15    Perceived Dyspnea (Exercise) 3    Duration Continue with 30 min of aerobic exercise without signs/symptoms of physical distress.    Intensity THRR unchanged      Progression   Progression Continue to progress workloads to maintain intensity without signs/symptoms of physical distress.      Resistance Training   Training Prescription Yes    Weight red bands    Reps 10-15    Time 10 Minutes      Oxygen   Oxygen Continuous    Liters 2      Recumbant Bike   Level 3    Minutes 15    METs 2.8      Track   Laps 6.5    Minutes 15  METs 2             Nutrition:  Target Goals: Understanding of nutrition guidelines, daily intake of sodium 1500mg , cholesterol 200mg , calories 30% from fat and 7% or less from saturated fats, daily to have 5  or more servings of fruits and vegetables.  Biometrics:  Pre Biometrics - 09/18/23 0805       Pre Biometrics   Grip Strength 22 kg              Nutrition Therapy Plan and Nutrition Goals:  Nutrition Therapy & Goals - 02/01/24 1041       Nutrition Therapy   Diet Heart Healthy Diet    Drug/Food Interactions Statins/Certain Fruits      Personal Nutrition Goals   Nutrition Goal Patient to identify strategies for weight maintenance/ weight gain of 0.5-2.0# per week.   goal in progress.   Comments Goal in progress. Skip has previously participated in pulmonary rehab in September 2024. Skip continues regular follow-up for kidney transplant; he has required dialysis since heart transplant in 2021. He currently receives hemodialysis M/W/F. His weight has been stable since new start to pulmonary rehab at 54.3kg (09/18/23).He lives with his sister who is a great support and does all of the grocery shopping and cooking. Skip reports drinking 1 Boost Breeze daily (250kcals, 9g protein) and 1 [employer] Milkshake (580kcals, 13g protein) or similar daily to aid with weight gain/weight maintenance. We have discussed multiple strategies for weight gain including increasing eating frequency, protein supplements, increasing calories from fat/protein/carbohydrates, etc. Dialysis team continues to monitor weight as well. Patient will benefit from participation in pulmonary rehab for nutrition, exercise, and lifestyle modification.      Intervention Plan   Intervention Prescribe, educate and counsel regarding individualized specific dietary modifications aiming towards targeted core components such as weight, hypertension, lipid management, diabetes, heart failure and other comorbidities.;Nutrition handout(s) given to patient.    Expected Outcomes Long Term Goal: Adherence to prescribed nutrition plan.;Short Term Goal: Understand basic principles of dietary content, such as calories, fat, sodium,  cholesterol and nutrients.             Nutrition Assessments:  MEDIFICTS Score Key: >=70 Need to make dietary changes  40-70 Heart Healthy Diet <= 40 Therapeutic Level Cholesterol Diet  Flowsheet Row PULMONARY REHAB OTHER RESPIRATORY from 05/02/2023 in [facility]  Picture Your Plate Total Score on Admission 62      Picture Your Plate Scores: <29 Unhealthy dietary pattern with much room for improvement. 41-50 Dietary pattern unlikely to meet recommendations for good health and room for improvement. 51-60 More healthful dietary pattern, with some room for improvement.  >60 Healthy dietary pattern, although there may be some specific behaviors that could be improved.    Nutrition Goals Re-Evaluation:  Nutrition Goals Re-Evaluation     Row Name 01/01/24 1142 02/01/24 1041           Goals   Current Weight 121 lb 0.5 oz (54.9 kg) 120 lb 9.5 oz (54.7 kg)      Comment Cr 8.58, GFR 6 HGB 10.8, potassium 5.7, Cr 7.38, GFR 8, BUN 70      Expected Outcome Goal in progress. Skip has previously participated in pulmonary rehab in September 2024. Skip continues regular follow-up for kidney transplant; he has required dialysis since heart transplant in 2021. He currently receives hemodialysis M/W/F. His weight has been stable since new start to pulmonary rehab  at 54.3kg (09/18/23); however, weight is down ~10# over the last 5 months. He lives with his sister who is a great support and does all of the grocery shopping and cooking. Skip reports drinking 1 Boost Breeze daily (250kcals, 9g protein) and 1 [employer] Milkshake (580kcals, 13g protein) daily to aid with weight gain/weight maintenance. We have discussed multiple strategies for weight gain including increasing eating frequency, protein supplements, increasing calories from fat/protein/carbohydrates, etc. Dialysis team continues to monitor weight as well. Patient will benefit from  participation in pulmonary rehab for nutrition, exercise, and lifestyle modification. Goal in progress. Skip has previously participated in pulmonary rehab in September 2024. Skip continues regular follow-up for kidney transplant; he has required dialysis since heart transplant in 2021. He currently receives hemodialysis M/W/F. His weight has been stable since new start to pulmonary rehab at 54.3kg (09/18/23).He lives with his sister who is a great support and does all of the grocery shopping and cooking. Skip reports drinking 1 Boost Breeze daily (250kcals, 9g protein) and 1 [employer] Milkshake (580kcals, 13g protein) or similar daily to aid with weight gain/weight maintenance. We have discussed multiple strategies for weight gain including increasing eating frequency, protein supplements, increasing calories from fat/protein/carbohydrates, etc. Dialysis team continues to monitor weight as well. Patient will benefit from participation in pulmonary rehab for nutrition, exercise, and lifestyle modification.               Nutrition Goals Discharge (Final Nutrition Goals Re-Evaluation):  Nutrition Goals Re-Evaluation - 02/01/24 1041       Goals   Current Weight 120 lb 9.5 oz (54.7 kg)    Comment HGB 10.8, potassium 5.7, Cr 7.38, GFR 8, BUN 70    Expected Outcome Goal in progress. Skip has previously participated in pulmonary rehab in September 2024. Skip continues regular follow-up for kidney transplant; he has required dialysis since heart transplant in 2021. He currently receives hemodialysis M/W/F. His weight has been stable since new start to pulmonary rehab at 54.3kg (09/18/23).He lives with his sister who is a great support and does all of the grocery shopping and cooking. Skip reports drinking 1 Boost Breeze daily (250kcals, 9g protein) and 1 Chick[employer]fil[employer] Milkshake (580kcals, 13g protein) or similar daily to aid with weight gain/weight maintenance. We have discussed multiple strategies for weight  gain including increasing eating frequency, protein supplements, increasing calories from fat/protein/carbohydrates, etc. Dialysis team continues to monitor weight as well. Patient will benefit from participation in pulmonary rehab for nutrition, exercise, and lifestyle modification.             Psychosocial: Target Goals: Acknowledge presence or absence of significant depression and/or stress, maximize coping skills, provide positive support system. Participant is able to verbalize types and ability to use techniques and skills needed for reducing stress and depression.  Initial Review & Psychosocial Screening:  Initial Psych Review & Screening - 09/18/23 0810       Initial Review   Current issues with None Identified      Family Dynamics   Good Support System? Yes    Comments sister and other family members      Barriers   Psychosocial barriers to participate in program There are no identifiable barriers or psychosocial needs.      Screening Interventions   Interventions Encouraged to exercise             Quality of Life Scores:  Scores of 19 and below usually indicate a poorer quality of life in these areas.  A  difference of  2-3 points is a clinically meaningful difference.  A difference of 2-3 points in the total score of the Quality of Life Index has been associated with significant improvement in overall quality of life, self-image, physical symptoms, and general health in studies assessing change in quality of life.  PHQ-9: Review Flowsheet       09/18/2023 04/24/2023  Depression screen PHQ 2/9  Decreased Interest 0 0  Down, Depressed, Hopeless 0 0  PHQ - 2 Score 0 0  Altered sleeping 1 0  Tired, decreased energy 0 0  Change in appetite 0 0  Feeling bad or failure about yourself  0 0  Trouble concentrating 0 0  Moving slowly or fidgety/restless 0 0  Suicidal thoughts 0 0  PHQ-9 Score 1 0  Difficult doing work/chores Not difficult at all Not difficult at  all   Interpretation of Total Score  Total Score Depression Severity:  1-4 = Minimal depression, 5-9 = Mild depression, 10-14 = Moderate depression, 15-19 = Moderately severe depression, 20-27 = Severe depression   Psychosocial Evaluation and Intervention:  Psychosocial Evaluation - 09/18/23 0810       Psychosocial Evaluation & Interventions   Interventions Encouraged to exercise with the program and follow exercise prescription    Comments Skip denies any psychosocial barriers to exercise.    Expected Outcomes For Skip to participate in PR free of psychosocial barriers.    Continue Psychosocial Services  No Follow up required             Psychosocial Re-Evaluation:  Psychosocial Re-Evaluation     Row Name 09/20/23 0803 10/06/23 1422 11/13/23 1003 12/06/23 1423 01/04/24 1150     Psychosocial Re-Evaluation   Current issues with None Identified None Identified None Identified None Identified None Identified   Comments Skip is scheduled to start the program on 09/26/23. No new barriers or concerns since orientation. Skip continues to deny any psy/soc needs during his monthly psychosocial evaluation. He states that his health pretty much dictates his plans for the day. He attends dialysis 3 days a week, pulmonary rehab 2 days a week and filters in MD appointments in between. 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 He states he has great support from his sister who drives him to and from appointments as well as serves as his primary caretaker. He denies any needs at this time. Skip has positive coping skills with a positive outlook. Skip continues to deny any psy/soc needs during his monthly psychosocial evaluation. He states that his health pretty much dictates his plans for the day. He attends dialysis 3 days a week, pulmonary rehab 2 days a week and filters in MD appointments in between. Since his heart transplant he is also being worked up for a  kidney transplant. He states he has great support from his sister who drives him to and from appointments as well as serves as his primary caretaker. He denies any needs at this time. Skip has positive coping skills with a positive outlook. Skip continues to deny any psy/soc needs during his monthly psychosocial evaluation. He states he has great support from his sister who drives him to and from appointments as well as serves as his primary caretaker. He denies any needs at this time. Skip has positive coping skills with a positive outlook. Skip continue to deny any psychosocial barriers or concerns   Expected Outcomes For Skip to participate in PR free of any psychosocial  barriers or concerns. For Skip to continue in PR free of any psychosocial barriers or concerns, to use positive coping skills to manage stress. For Skip to continue in PR free of any psychosocial barriers or concerns, to use positive coping skills to manage stress. For Skip to continue in PR free of any psychosocial barriers or concerns, to use positive coping skills to manage stress. For Skip to continue in PR free of any psychosocial barriers or concerns, to use positive coping skills to manage stress.   Interventions Encouraged to attend Pulmonary Rehabilitation for the exercise Encouraged to attend Pulmonary Rehabilitation for the exercise Encouraged to attend Pulmonary Rehabilitation for the exercise Encouraged to attend Pulmonary Rehabilitation for the exercise Encouraged to attend Pulmonary Rehabilitation for the exercise   Continue Psychosocial Services  No Follow up required No Follow up required No Follow up required No Follow up required No Follow up required    Row Name 01/29/24 1123             Psychosocial Re-Evaluation   Current issues with None Identified       Comments Skip continues to deny any psychosocial barriers or concerns       Expected Outcomes For Skip to continue in PR free of any psychosocial barriers or  concerns       Interventions Encouraged to attend Pulmonary Rehabilitation for the exercise       Continue Psychosocial Services  No Follow up required                Psychosocial Discharge (Final Psychosocial Re-Evaluation):  Psychosocial Re-Evaluation - 01/29/24 1123       Psychosocial Re-Evaluation   Current issues with None Identified    Comments Skip continues to deny any psychosocial barriers or concerns    Expected Outcomes For Skip to continue in PR free of any psychosocial barriers or concerns    Interventions Encouraged to attend Pulmonary Rehabilitation for the exercise    Continue Psychosocial Services  No Follow up required             Education: Education Goals: Education classes will be provided on a weekly basis, covering required topics. Participant will state understanding/return demonstration of topics presented.  Learning Barriers/Preferences:   Education Topics: Know Your Numbers Group instruction that is supported by a PowerPoint presentation. Instructor discusses importance of knowing and understanding resting, exercise, and post-exercise oxygen saturation, heart rate, and blood pressure. Oxygen saturation, heart rate, blood pressure, rating of perceived exertion, and dyspnea are reviewed along with a normal range for these values.  Flowsheet Row PULMONARY REHAB OTHER RESPIRATORY from 01/25/2024 in [facility] for [facility], Vascular, [facility]  Date 01/25/24  Educator EP  Instruction Review Code 1- Verbalizes Understanding       Exercise for the Pulmonary Patient Group instruction that is supported by a PowerPoint presentation. Instructor discusses benefits of exercise, core components of exercise, frequency, duration, and intensity of an exercise routine, importance of utilizing pulse oximetry during exercise, safety while exercising, and options of places to exercise outside of rehab.  Flowsheet Row PULMONARY REHAB OTHER  RESPIRATORY from 10/19/2023 in [facility] for Heart, Vascular, & Lung Health  Date 10/19/23  Educator EP  Instruction Review Code 1- Verbalizes Understanding       MET Level  Group instruction provided by PowerPoint, verbal discussion, and written material to support subject matter. Instructor reviews what METs are and how to increase METs.  Flowsheet  Row PULMONARY REHAB OTHER RESPIRATORY from 12/14/2023 in [facility] Heart, Vascular, & Lung Health  Date 12/14/23  Educator EP  Instruction Review Code 1- Verbalizes Understanding       Pulmonary Medications Verbally interactive group education provided by instructor with focus on inhaled medications and proper administration. Flowsheet Row PULMONARY REHAB OTHER RESPIRATORY from 01/11/2024 in [facility], [facility]ascular, [facility]  Date 01/11/24  Educator RT  Instruction Review Code 1- Verbalizes Understanding       Anatomy and Physiology of the Respiratory System Group instruction provided by PowerPoint, verbal discussion, and written material to support subject matter. Instructor reviews respiratory cycle and anatomical components of the respiratory system and their functions. Instructor also reviews differences in obstructive and restrictive respiratory diseases with examples of each.  Flowsheet Row PULMONARY REHAB OTHER RESPIRATORY from 12/28/2023 in [facility] for Heart, Vascular, & Lung Health  Date 12/28/23  Educator RT  Instruction Review Code 1- Verbalizes Understanding       Oxygen Safety Group instruction provided by PowerPoint, verbal discussion, and written material to support subject matter. There is an overview of "What is Oxygen" and "Why do we need it".  Instructor also reviews how to create a safe environment for oxygen use, the importance of using oxygen as prescribed, and the risks of noncompliance. There  is a brief discussion on traveling with oxygen and resources the patient may utilize. Flowsheet Row PULMONARY REHAB OTHER RESPIRATORY from 11/02/2023 in [facility] for Heart, Vascular, & Lung Health  Date 11/02/23  Educator RN  Instruction Review Code 1- Verbalizes Understanding       Oxygen Use Group instruction provided by PowerPoint, verbal discussion, and written material to discuss how supplemental oxygen is prescribed and different types of oxygen supply systems. Resources for more information are provided.  Flowsheet Row PULMONARY REHAB OTHER RESPIRATORY from 11/09/2023 in [facility] for Heart, Vascular, & Lung Health  Date 11/09/23  Educator RT  Instruction Review Code 1- Verbalizes Understanding       Breathing Techniques Group instruction that is supported by demonstration and informational handouts. Instructor discusses the benefits of pursed lip and diaphragmatic breathing and detailed demonstration on how to perform both.  Flowsheet Row PULMONARY REHAB OTHER RESPIRATORY from 11/16/2023 in [facility] for Heart, Vascular, & Lung Health  Date 11/16/23  Educator RN  Instruction Review Code 1- Verbalizes Understanding        Risk Factor Reduction Group instruction that is supported by a PowerPoint presentation. Instructor discusses the definition of a risk factor, different risk factors for pulmonary disease, and how the heart and lungs work together. Flowsheet Row PULMONARY REHAB OTHER RESPIRATORY from 12/07/2023 in [facility] for Heart, Vascular, & Lung Health  Date 12/07/23  Educator EP  Instruction Review Code 1- Verbalizes Understanding       Pulmonary Diseases Group instruction provided by PowerPoint, verbal discussion, and written material to support subject matter. Instructor gives an overview of the different type of pulmonary diseases. There is also a discussion on  risk factors and symptoms as well as ways to manage the diseases. Flowsheet Row PULMONARY REHAB OTHER RESPIRATORY from 09/28/2023 in [facility] for Heart, Vascular, & Lung Health  Date 09/28/23  Educator RT  Instruction Review Code 1- Verbalizes Understanding       Stress and Energy Conservation Group instruction  provided by PowerPoint, verbal discussion, and written material to support subject matter. Instructor gives an overview of stress and the impact it can have on the body. Instructor also reviews ways to reduce stress. There is also a discussion on energy conservation and ways to conserve energy throughout the day. Flowsheet Row PULMONARY REHAB OTHER RESPIRATORY from 11/23/2023 in [facility] for Heart, Vascular, & Lung Health  Date 11/23/23  Educator RN  Instruction Review Code 1- Verbalizes Understanding       Warning Signs and Symptoms Group instruction provided by PowerPoint, verbal discussion, and written material to support subject matter. Instructor reviews warning signs and symptoms of stroke, heart attack, cold and flu. Instructor also reviews ways to prevent the spread of infection.   Other Education Group or individual verbal, written, or video instructions that support the educational goals of the pulmonary rehab program. Flowsheet Row PULMONARY REHAB OTHER RESPIRATORY from 06/29/2023 in [facility] for Heart, Vascular, & Lung Health  Date 06/29/23  Educator RT  Instruction Review Code 1- Verbalizes Understanding        Knowledge Questionnaire Score:   Core Components/Risk Factors/Patient Goals at Admission:  Personal Goals and Risk Factors at Admission - 09/18/23 0913       Core Components/Risk Factors/Patient Goals on Admission    Weight Management --    Improve shortness of breath with ADL's Yes    Intervention Provide education, individualized exercise plan and daily activity  instruction to help decrease symptoms of SOB with activities of daily living.    Expected Outcomes Short Term: Improve cardiorespiratory fitness to achieve a reduction of symptoms when performing ADLs;Long Term: Be able to perform more ADLs without symptoms or delay the onset of symptoms             Core Components/Risk Factors/Patient Goals Review:   Goals and Risk Factor Review     Row Name 09/20/23 0805 10/06/23 1422 11/13/23 1007 12/06/23 1426 01/04/24 1151     Core Components/Risk Factors/Patient Goals Review   Personal Goals Review Improve shortness of breath with ADL's;Develop more efficient breathing techniques such as purse lipped breathing and diaphragmatic breathing and practicing self-pacing with activity. Improve shortness of breath with ADL's;Develop more efficient breathing techniques such as purse lipped breathing and diaphragmatic breathing and practicing self-pacing with activity. Improve shortness of breath with ADL's Improve shortness of breath with ADL's Improve shortness of breath with ADL's   Review Skip is scheduled to start PR on 09/26/23. Goal progressing for improving shortness of breath with ADL's. Goal progressing for developing more efficient breathing techniques such as purse lipped breathing and diaphragmatic breathing; and practicing self-pacing with activity. Monthly review of patient's Core Components/Risk Factors/Patient Goals are as follows: Goal progressing on improving Skip's shortness of breath with ADLs. Skip has been able to wean off oxygen with exertion. He currently only wears oxygen at night. Skip stated he is able to do "a little" more for himself than previously. He can walk farther and has more stamina. Goal met for developing more efficient breathing techniques such as purse lipped breathing and diaphragmatic breathing; and practicing self-pacing activities. Skip can initiate purse lipped breathing on his own while exercising. He can correctly  demonstrate diaphragmatic breathing to staff. He knows how to self-pace himself before he becomes breathless with exertion and/or exercising. Skip will continue to benefit from participation in PR for nutrition, education, exercise, and lifestyle modification. Monthly review of patient's Core Components/Risk Factors/Patient Goals are as  follows: Goal progressing on improving Skip's shortness of breath with ADLs. Skip has been able to wean off oxygen with exertion. He currently only wears oxygen at night. Skip stated he is able to do "a little" more for himself than previously. He can walk farther and has more stamina. Skip will continue to benefit from participation in PR for nutrition, education, exercise, and lifestyle modification. Monthly review of patient's Core Components/Risk Factors/Patient Goals are as follows: Goal progressing on improving Skip's shortness of breath with ADLs. Skip has been able to wean off oxygen with exertion. He currently only wears oxygen at night. Skip stated he is able to do "a little" more for himself than previously. He can walk farther and has more stamina. Skip will continue to benefit from participation in PR for nutrition, education, exercise, and lifestyle modification. Monthly review of patient's Core Components/Risk Factors/Patient Goals are as follows: Goal progressing on improving Skip's shortness of breath with ADLs. Skip is able to exercise on RA to keep his sats >88%. He is currently exercising on the recumbant bike and walking the track. Skip will continue to benefit from participation in PR for nutrition, education, exercise, and lifestyle modification.   Expected Outcomes For Skip to improve shortness of breath with ADL's and develop more efficient breathing techniques such as purse lipped breathing and diaphragmatic breathing; and practicing self-pacing with activity. For Skip to improve shortness of breath with ADL's For Skip to improve shortness of breath with  ADL's For Skip to improve shortness of breath with ADL's For Skip to improve shortness of breath with ADL's    Row Name 01/29/24 1124             Core Components/Risk Factors/Patient Goals Review   Personal Goals Review Improve shortness of breath with ADL's       Review Monthly review of patient's Core Components/Risk Factors/Patient Goals are as follows: Goal progressing on improving Skip's shortness of breath with ADLs. Skip is currently using 2L to keep his sats >88% while exercising. He is currently exercising on the recumbant bike and walking the track. Skip will continue to benefit from participation in PR for nutrition, education, exercise, and lifestyle modification.       Expected Outcomes For Skip to improve shortness of breath with ADL's                Core Components/Risk Factors/Patient Goals at Discharge (Final Review):   Goals and Risk Factor Review - 01/29/24 1124       Core Components/Risk Factors/Patient Goals Review   Personal Goals Review Improve shortness of breath with ADL's    Review Monthly review of patient's Core Components/Risk Factors/Patient Goals are as follows: Goal progressing on improving Skip's shortness of breath with ADLs. Skip is currently using 2L to keep his sats >88% while exercising. He is currently exercising on the recumbant bike and walking the track. Skip will continue to benefit from participation in PR for nutrition, education, exercise, and lifestyle modification.    Expected Outcomes For Skip to improve shortness of breath with ADL's             ITP Comments:Pt is making expected progress toward Pulmonary Rehab goals after completing 29 session(s). Recommend continued exercise, life style modification, education, and utilization of breathing techniques to increase stamina and strength, while also decreasing shortness of breath with exertion.  Dr. Mechele Collin is Medical Director for Pulmonary Rehab at [facility].      Comments:

## 2024-02-07 NOTE — Telephone Encounter (Signed)
 Patient's sister Mariah Shines called stating that Donley is still at [facility] and will not be in to 8:15am class tomorrow.

## 2024-02-08 ENCOUNTER — Encounter (HOSPITAL_COMMUNITY)

## 2024-02-09 DIAGNOSIS — Z992 Dependence on renal dialysis: Principal | ICD-10-CM

## 2024-02-09 DIAGNOSIS — N186 End stage renal disease: Principal | ICD-10-CM

## 2024-02-09 DIAGNOSIS — Z941 Heart transplant status: Principal | ICD-10-CM

## 2024-02-09 MED ORDER — APIXABAN 5 MG TABLET
ORAL_TABLET | Freq: Two times a day (BID) | ORAL | 0 refills | 90.00 days
Start: 2024-02-09 — End: ?

## 2024-02-09 MED ORDER — TACROLIMUS 5 MG CAPSULE, IMMEDIATE-RELEASE
ORAL_CAPSULE | Freq: Two times a day (BID) | ORAL | 11 refills | 30.00 days
Start: 2024-02-09 — End: ?

## 2024-02-10 MED ORDER — TACROLIMUS 5 MG CAPSULE, IMMEDIATE-RELEASE
ORAL_CAPSULE | Freq: Two times a day (BID) | ORAL | 11 refills | 30.00 days | Status: CP
Start: 2024-02-10 — End: ?

## 2024-02-10 MED ORDER — APIXABAN 5 MG TABLET
ORAL_TABLET | Freq: Two times a day (BID) | ORAL | 0 refills | 90.00 days | Status: CP
Start: 2024-02-10 — End: ?

## 2024-02-12 DIAGNOSIS — Z941 Heart transplant status: Principal | ICD-10-CM

## 2024-02-12 DIAGNOSIS — R799 Abnormal finding of blood chemistry, unspecified: Principal | ICD-10-CM

## 2024-02-12 DIAGNOSIS — E785 Hyperlipidemia, unspecified: Principal | ICD-10-CM

## 2024-02-13 ENCOUNTER — Telehealth (HOSPITAL_COMMUNITY): Payer: Self-pay | Admitting: *Deleted

## 2024-02-13 ENCOUNTER — Encounter (HOSPITAL_COMMUNITY): Admission: RE | Admit: 2024-02-13 | Source: Ambulatory Visit

## 2024-02-13 ENCOUNTER — Ambulatory Visit: Admit: 2024-02-13 | Discharge: 2024-02-20 | Payer: MEDICARE

## 2024-02-13 ENCOUNTER — Ambulatory Visit
Admit: 2024-02-13 | Discharge: 2024-02-20 | Disposition: A | Discharge status: Home / Self Care | Payer: MEDICARE | Admitting: Cardiovascular Disease

## 2024-02-13 ENCOUNTER — Ambulatory Visit: Admit: 2024-02-13 | Payer: MEDICARE

## 2024-02-13 ENCOUNTER — Inpatient Hospital Stay
Admit: 2024-02-13 | Discharge: 2024-02-20 | Disposition: A | Discharge status: Home / Self Care | Payer: MEDICARE | Admitting: Cardiovascular Disease

## 2024-02-13 NOTE — Telephone Encounter (Signed)
 Skip called to report he is still recovering from his hospital stay. Will cancel today's appointment. German Koller BS, ACSM-CEP 02/13/2024 7:49 AM

## 2024-02-15 ENCOUNTER — Telehealth (HOSPITAL_COMMUNITY): Payer: Self-pay

## 2024-02-15 ENCOUNTER — Encounter (HOSPITAL_COMMUNITY): Admission: RE | Admit: 2024-02-15 | Source: Ambulatory Visit

## 2024-02-15 NOTE — Telephone Encounter (Signed)
 Rickey Erickson has been absent from Pulmonary Rehab for being hospitalized. Will follow up next week.

## 2024-02-19 DIAGNOSIS — Z79899 Other long term (current) drug therapy: Principal | ICD-10-CM

## 2024-02-19 DIAGNOSIS — Z941 Heart transplant status: Principal | ICD-10-CM

## 2024-02-20 ENCOUNTER — Telehealth (HOSPITAL_COMMUNITY): Payer: Self-pay | Admitting: *Deleted

## 2024-02-20 ENCOUNTER — Encounter (HOSPITAL_COMMUNITY): Admission: RE | Admit: 2024-02-20 | Source: Ambulatory Visit

## 2024-02-20 NOTE — Addendum Note (Signed)
 Encounter addended by: Floretta Huron on: 02/20/2024 9:41 AM  Actions taken: Flowsheet data copied forward, Flowsheet accepted

## 2024-02-20 NOTE — Telephone Encounter (Signed)
 Mariah Shines called to state that Rickey Erickson is still in the hospital. Will cancel his appointment.  German Koller BS, ACSM-CEP 02/20/2024 7:44 AM

## 2024-02-22 ENCOUNTER — Telehealth (HOSPITAL_COMMUNITY): Payer: Self-pay | Admitting: *Deleted

## 2024-02-22 ENCOUNTER — Encounter (HOSPITAL_COMMUNITY): Admission: RE | Admit: 2024-02-22 | Source: Ambulatory Visit

## 2024-02-22 NOTE — Telephone Encounter (Signed)
 Pt called and stated he is still recovering from his hospital admission. Will cancel appointment for today.  German Koller BS, ACSM-CEP 02/22/2024 8:06 AM

## 2024-02-24 ENCOUNTER — Emergency Department (HOSPITAL_COMMUNITY)

## 2024-02-24 ENCOUNTER — Encounter (HOSPITAL_COMMUNITY): Payer: Self-pay | Admitting: Emergency Medicine

## 2024-02-24 ENCOUNTER — Other Ambulatory Visit: Payer: Self-pay

## 2024-02-24 ENCOUNTER — Emergency Department (HOSPITAL_COMMUNITY)
Admission: EM | Admit: 2024-02-24 | Discharge: 2024-02-24 | Disposition: A | Discharge status: Other Acute Inpatient Hospital | Attending: Emergency Medicine | Admitting: Emergency Medicine

## 2024-02-24 DIAGNOSIS — N186 End stage renal disease: Secondary | ICD-10-CM | POA: Insufficient documentation

## 2024-02-24 DIAGNOSIS — Z79899 Other long term (current) drug therapy: Secondary | ICD-10-CM | POA: Insufficient documentation

## 2024-02-24 DIAGNOSIS — Z7901 Long term (current) use of anticoagulants: Secondary | ICD-10-CM | POA: Diagnosis not present

## 2024-02-24 DIAGNOSIS — Z992 Dependence on renal dialysis: Secondary | ICD-10-CM | POA: Diagnosis not present

## 2024-02-24 DIAGNOSIS — J9621 Acute and chronic respiratory failure with hypoxia: Secondary | ICD-10-CM | POA: Diagnosis not present

## 2024-02-24 DIAGNOSIS — Z941 Heart transplant status: Secondary | ICD-10-CM | POA: Diagnosis not present

## 2024-02-24 DIAGNOSIS — I120 Hypertensive chronic kidney disease with stage 5 chronic kidney disease or end stage renal disease: Secondary | ICD-10-CM | POA: Diagnosis not present

## 2024-02-24 DIAGNOSIS — J9622 Acute and chronic respiratory failure with hypercapnia: Secondary | ICD-10-CM | POA: Insufficient documentation

## 2024-02-24 DIAGNOSIS — R531 Weakness: Secondary | ICD-10-CM | POA: Insufficient documentation

## 2024-02-24 DIAGNOSIS — R0600 Dyspnea, unspecified: Secondary | ICD-10-CM | POA: Diagnosis present

## 2024-02-24 LAB — I-STAT ARTERIAL BLOOD GAS, ED
Acid-Base Excess: 0 mmol/L (ref 0.0–2.0)
Bicarbonate: 31.6 mmol/L — ABNORMAL HIGH (ref 20.0–28.0)
Calcium, Ion: 1.17 mmol/L (ref 1.15–1.40)
HCT: 31 % — ABNORMAL LOW (ref 39.0–52.0)
Hemoglobin: 10.5 g/dL — ABNORMAL LOW (ref 13.0–17.0)
O2 Saturation: 80 %
Potassium: 4.2 mmol/L (ref 3.5–5.1)
Sodium: 136 mmol/L (ref 135–145)
TCO2: 35 mmol/L — ABNORMAL HIGH (ref 22–32)
pCO2 arterial: 100.2 mmHg (ref 32–48)
pH, Arterial: 7.107 — CL (ref 7.35–7.45)
pO2, Arterial: 63 mmHg — ABNORMAL LOW (ref 83–108)

## 2024-02-24 LAB — I-STAT VENOUS BLOOD GAS, ED
Acid-Base Excess: 0 mmol/L (ref 0.0–2.0)
Acid-base deficit: 1 mmol/L (ref 0.0–2.0)
Bicarbonate: 28.1 mmol/L — ABNORMAL HIGH (ref 20.0–28.0)
Bicarbonate: 31.6 mmol/L — ABNORMAL HIGH (ref 20.0–28.0)
Calcium, Ion: 1 mmol/L — ABNORMAL LOW (ref 1.15–1.40)
Calcium, Ion: 1.16 mmol/L (ref 1.15–1.40)
HCT: 32 % — ABNORMAL LOW (ref 39.0–52.0)
HCT: 33 % — ABNORMAL LOW (ref 39.0–52.0)
Hemoglobin: 10.9 g/dL — ABNORMAL LOW (ref 13.0–17.0)
Hemoglobin: 11.2 g/dL — ABNORMAL LOW (ref 13.0–17.0)
O2 Saturation: 68 %
O2 Saturation: 57 %
Potassium: 4.1 mmol/L (ref 3.5–5.1)
Potassium: 4.5 mmol/L (ref 3.5–5.1)
Sodium: 135 mmol/L (ref 135–145)
Sodium: 136 mmol/L (ref 135–145)
TCO2: 30 mmol/L (ref 22–32)
TCO2: 35 mmol/L — ABNORMAL HIGH (ref 22–32)
pCO2, Ven: 59.5 mmHg (ref 44–60)
pCO2, Ven: 104.1 mmHg (ref 44–60)
pH, Ven: 7.282 (ref 7.25–7.43)
pH, Ven: 7.09 — CL (ref 7.25–7.43)
pO2, Ven: 41 mmHg (ref 32–45)
pO2, Ven: 43 mmHg (ref 32–45)

## 2024-02-24 LAB — RESP PANEL BY RT-PCR (RSV, FLU A&B, COVID)  RVPGX2
Influenza A by PCR: NEGATIVE
Influenza B by PCR: NEGATIVE
Resp Syncytial Virus by PCR: NEGATIVE
SARS Coronavirus 2 by RT PCR: NEGATIVE

## 2024-02-24 LAB — CBC
HCT: 33.8 % — ABNORMAL LOW (ref 39.0–52.0)
Hemoglobin: 9.3 g/dL — ABNORMAL LOW (ref 13.0–17.0)
MCH: 29.5 pg (ref 26.0–34.0)
MCHC: 27.5 g/dL — ABNORMAL LOW (ref 30.0–36.0)
MCV: 107.3 fL — ABNORMAL HIGH (ref 80.0–100.0)
Platelets: 156 10*3/uL (ref 150–400)
RBC: 3.15 MIL/uL — ABNORMAL LOW (ref 4.22–5.81)
RDW: 16.1 % — ABNORMAL HIGH (ref 11.5–15.5)
WBC: 3.8 10*3/uL — ABNORMAL LOW (ref 4.0–10.5)
nRBC: 0 % (ref 0.0–0.2)

## 2024-02-24 LAB — COMPREHENSIVE METABOLIC PANEL WITH GFR
ALT: 7 U/L (ref 0–44)
AST: 14 U/L — ABNORMAL LOW (ref 15–41)
Albumin: 3.2 g/dL — ABNORMAL LOW (ref 3.5–5.0)
Alkaline Phosphatase: 64 U/L (ref 38–126)
Anion gap: 11 (ref 5–15)
BUN: 30 mg/dL — ABNORMAL HIGH (ref 8–23)
CO2: 27 mmol/L (ref 22–32)
Calcium: 7.9 mg/dL — ABNORMAL LOW (ref 8.9–10.3)
Chloride: 98 mmol/L (ref 98–111)
Creatinine, Ser: 4.42 mg/dL — ABNORMAL HIGH (ref 0.61–1.24)
GFR, Estimated: 14 mL/min — ABNORMAL LOW (ref >60)
Glucose, Bld: 138 mg/dL — ABNORMAL HIGH (ref 70–99)
Potassium: 4.2 mmol/L (ref 3.5–5.1)
Sodium: 136 mmol/L (ref 135–145)
Total Bilirubin: 0.5 mg/dL (ref 0.0–1.2)
Total Protein: 6.8 g/dL (ref 6.5–8.1)

## 2024-02-24 LAB — I-STAT CG4 LACTIC ACID, ED: Lactic Acid, Venous: 0.4 mmol/L — ABNORMAL LOW (ref 0.5–1.9)

## 2024-02-24 MED ORDER — MIDAZOLAM-SODIUM CHLORIDE 100-0.9 MG/100ML-% IV SOLN
0.0000 mg/h | INTRAVENOUS | Status: DC
  Administered 2024-02-24: 2 mg/h via INTRAVENOUS
  Filled 2024-02-24: qty 100

## 2024-02-24 MED ORDER — FENTANYL 2500MCG IN NS 250ML (10MCG/ML) PREMIX INFUSION
25.0000 ug/h | INTRAVENOUS | Status: DC
  Administered 2024-02-24: 50 ug/h via INTRAVENOUS
  Filled 2024-02-24: qty 250

## 2024-02-24 MED ORDER — SODIUM CHLORIDE 0.9 % IV SOLN
1.0000 g | Freq: Once | INTRAVENOUS | Status: AC
  Administered 2024-02-24: 1 g via INTRAVENOUS
  Filled 2024-02-24: qty 20

## 2024-02-24 MED ORDER — FENTANYL BOLUS VIA INFUSION: 25.0000 ug | INTRAVENOUS | Status: DC | PRN

## 2024-02-24 MED ORDER — ETOMIDATE 2 MG/ML IV SOLN
20.0000 mg | Freq: Once | INTRAVENOUS | Status: AC
  Administered 2024-02-24: 20 mg via INTRAVENOUS

## 2024-02-24 MED ORDER — MIDAZOLAM BOLUS VIA INFUSION: 0.0000 mg | INTRAVENOUS | Status: DC | PRN

## 2024-02-24 MED ORDER — VANCOMYCIN HCL 1250 MG/250ML IV SOLN
1250.0000 mg | Freq: Once | INTRAVENOUS | Status: AC
  Administered 2024-02-24: 1250 mg via INTRAVENOUS
  Filled 2024-02-24 (×2): qty 250

## 2024-02-24 MED ORDER — ROCURONIUM BROMIDE 10 MG/ML (PF) SYRINGE
60.0000 mg | PREFILLED_SYRINGE | Freq: Once | INTRAVENOUS | Status: AC
  Administered 2024-02-24: 60 mg via INTRAVENOUS

## 2024-02-24 NOTE — Progress Notes (Signed)
 ABG attempt unsuccessful. I called a 2nd RT to assist. Will attempt again when 2nd RT arrives.

## 2024-02-24 NOTE — ED Provider Triage Note (Signed)
 Emergency Medicine Provider Triage Evaluation Note  Rickey Erickson , a 68 y.o. male  was evaluated in triage.  Pt here with sister.  Patient with extremely complex medical history.  Recently discharged from [facility] health secondary to issue with fistula graft site.  Patient ESRD goes to dialysis Monday Wednesday Friday, last dialyzed yesterday.  Full session.  Sister reports that his AVAP machine at home will read in the mid 13s when he is sleeping, does not improve with oxygen.  Patient chronically wears oxygen, denies recently increasing this.  When asked the patient feels fine, he states "yes".  He reports "nothing is bothering me".  Denies leg swelling, fevers at home, nausea, vomiting, diarrhea.  Denies sore throat, bodyaches or chills.  Denies chest pain or shortness of breath.  Denies abdominal pain, dysuria.  Patient receives all of his health care at [facility].  Review of Systems  Positive:  Negative:   Physical Exam  BP (!) 113/56 (BP Location: Left Arm)   Pulse (!) 105   Temp 98.2 F (36.8 C) (Oral)   Resp 18   Ht 5\' 8"  (1.727 m)   Wt 54.4 kg   SpO2 91%   BMI 18.25 kg/m  Gen:   Awake, no distress   Resp:  Normal effort  MSK:   Moves extremities without difficulty  Other:    Medical Decision Making  Medically screening exam initiated at 11:18 AM.  Appropriate orders placed.  Tilford Mollinedo was informed that the remainder of the evaluation will be completed by another provider, this initial triage assessment does not replace that evaluation, and the importance of remaining in the ED until their evaluation is complete.  RN in triage advised patient needs room.   Adel Aden, PA-C 02/24/24 1121

## 2024-02-24 NOTE — ED Notes (Signed)
 [facility] called for transport to [facility] hospital Bed Assignment   CICU   [phone] Dr. Juleen Oakland accepting

## 2024-02-24 NOTE — Progress Notes (Signed)
 Assisted MD with intubation for airway protection during transport. Patient had been on BIPAP without improvement on ABG. Pt was intubated with 7.5 ETT secured at 24 at teeth with hollister ETAD. BBS present and equal. Pt was place on [facility] vent as they were there to transport. CXR completed to confirm placement, WNL.

## 2024-02-24 NOTE — ED Notes (Signed)
 [facility] arrived to transport patient.  EDP at bedside to intubate prior to transfer.

## 2024-02-24 NOTE — ED Provider Notes (Signed)
 [facility] DEPARTMENT AT [facility] Provider Note   CSN: [id number] Arrival date & time: 02/24/24  1008     History  Chief Complaint  Patient presents with   Weakness    Rickey Erickson is a 68 y.o. male.  HPI 68 year old male status post cardiac transplant, end-stage renal disease on dialysis, Hypertension, history of acute respiratory failure with hypoxia, on chronic oxygen, presents today with increasing weakness, lethargy, and dyspnea.  Patient reports that he was in the hospital last week at [facility] due to clotting of right AV fistula and placement of leg dialysis catheter.  Denies any headache, chest pain, fever but may have had some chills.  He has had decreased p.o. intake and does not make urine.    Home Medications Prior to Admission medications   Medication Sig Start Date End Date Taking? Authorizing Provider  acetaminophen  (TYLENOL ) 500 MG tablet Take 500 mg by mouth every 6 (six) hours as needed for headache (pain).    [provider]  albumin  human 25 % bottle Inject 100 mLs (25 g total) into the vein as needed (Give during dialysis for SBP <100.). 07/27/23   Vann, Jessica U, DO  amLODipine (NORVASC) 10 MG tablet Take 10 mg by mouth daily.    [provider]  apixaban  (ELIQUIS ) 2.5 MG TABS tablet Take 2.5 mg by mouth 2 (two) times daily.    [provider]  atorvastatin  (LIPITOR) 20 MG tablet Take 20 mg by mouth every morning. 09/10/20   [provider]  CALCIUM  PO Take 1 tablet by mouth 2 (two) times daily.    [provider]  clopidogrel  (PLAVIX ) 75 MG tablet Take 75 mg by mouth daily. 12/30/23   [provider]  Darbepoetin Alfa  (ARANESP ) 150 MCG/0.3ML SOSY injection Inject 0.3 mLs (150 mcg total) into the skin every Wednesday at 6 PM. 08/02/23   Enrigue Harvard, DO  midodrine  (PROAMATINE ) 10 MG tablet Take 10 mg by mouth See admin instructions. Take 10 mg once daily prior to dialysis on Monday,  Wednesday, Friday.    [provider]  mirtazapine  (REMERON ) 30 MG tablet Take 30 mg by mouth at bedtime.    [provider]  pantoprazole  (PROTONIX ) 40 MG tablet Take 40 mg by mouth every morning. 09/02/20   [provider]  tacrolimus  (PROGRAF ) 1 MG capsule Take 1 mg by mouth 2 (two) times daily. Take in addition to 5 mg capsule twice daily, for a total dose of 6 mg twice daily. 09/04/20   [provider]  tacrolimus  (PROGRAF ) 5 MG capsule Take 5 mg by mouth 2 (two) times daily. Take in addition to 1 mg capsule twice daily, for a total dose of 6 mg twice daily. 05/10/21   [provider]      Allergies    Transderm-scop [scopolamine], Nsaids, Seroquel  [quetiapine ], and Ativan  [lorazepam ]    Review of Systems   Review of Systems  Physical Exam Updated Vital Signs BP 117/67   Pulse 98   Temp 98.1 F (36.7 C) (Oral)   Resp (!) 21   Ht 1.727 m (5\' 8" )   Wt 54.4 kg   SpO2 100%   BMI 18.25 kg/m  Physical Exam Vitals reviewed.  HENT:     Head: Normocephalic.     Right Ear: External ear normal.     Left Ear: External ear normal.     Nose: Nose normal.     Mouth/Throat:     Mouth:  Mucous membranes are dry.  Eyes:     Pupils: Pupils are equal, round, and reactive to light.  Cardiovascular:     Rate and Rhythm: Regular rhythm. Tachycardia present.     Pulses: Normal pulses.  Pulmonary:     Comments: Tachypnea noted with decreased breath sounds throughout Abdominal:     General: Abdomen is flat.     Palpations: Abdomen is soft.  Musculoskeletal:        General: Normal range of motion.     Cervical back: Normal range of motion.     Comments: Dialysis catheter right thigh  Skin:    Capillary Refill: Capillary refill takes less than 2 seconds.     Coloration: Skin is pale.  Neurological:     General: No focal deficit present.     Mental Status: He is alert.     ED Results / Procedures / Treatments   Labs (all labs ordered are  listed, but only abnormal results are displayed) Labs Reviewed  COMPREHENSIVE METABOLIC PANEL WITH GFR - Abnormal; Notable for the following components:      Result Value   Glucose, Bld 138 (*)    BUN 30 (*)    Creatinine, Ser 4.42 (*)    Calcium  7.9 (*)    Albumin  3.2 (*)    AST 14 (*)    GFR, Estimated 14 (*)    All other components within normal limits  CBC - Abnormal; Notable for the following components:   WBC 3.8 (*)    RBC 3.15 (*)    Hemoglobin 9.3 (*)    HCT 33.8 (*)    MCV 107.3 (*)    MCHC 27.5 (*)    RDW 16.1 (*)    All other components within normal limits  I-STAT VENOUS BLOOD GAS, ED - Abnormal; Notable for the following components:   Bicarbonate 28.1 (*)    Calcium , Ion 1.00 (*)    HCT 32.0 (*)    Hemoglobin 10.9 (*)    All other components within normal limits  I-STAT CG4 LACTIC ACID, ED - Abnormal; Notable for the following components:   Lactic Acid, Venous 0.4 (*)    All other components within normal limits  I-STAT ARTERIAL BLOOD GAS, ED - Abnormal; Notable for the following components:   pH, Arterial 7.107 (*)    pCO2 arterial 100.2 (*)    pO2, Arterial 63 (*)    Bicarbonate 31.6 (*)    TCO2 35 (*)    HCT 31.0 (*)    Hemoglobin 10.5 (*)    All other components within normal limits  RESP PANEL BY RT-PCR (RSV, FLU A&B, COVID)  RVPGX2  CULTURE, BLOOD (ROUTINE X 2)  CULTURE, BLOOD (ROUTINE X 2)  URINALYSIS, ROUTINE W REFLEX MICROSCOPIC  CBG MONITORING, ED  I-STAT CG4 LACTIC ACID, ED  I-STAT ARTERIAL BLOOD GAS, ED    EKG EKG Interpretation Date/Time:  Saturday Feb 24 2024 10:54:00 EDT Ventricular Rate:  105 PR Interval:  172 QRS Duration:  102 QT Interval:  372 QTC Calculation: 491 R Axis:   105  Text Interpretation: Sinus tachycardia Incomplete right bundle branch block Possible Right ventricular hypertrophy Abnormal ECG No significant change since last tracing Confirmed by Auston Blush [phone]) on 02/24/2024 3:06:47 PM  Radiology DG Chest 2  View Result Date: 02/24/2024 CLINICAL DATA:  Hypoxia. EXAM: CHEST - 2 VIEW COMPARISON:  01/11/2024 FINDINGS: Heart size remains within normal limits. Prior median sternotomy. Right-sided central venous catheter is stable in position, with  tip overlying the right atrium. Small bilateral pleural effusions are again seen. New right middle lobe consolidation or collapse is best seen on the lateral projection. Increased atelectasis or infiltrate also noted in the left lung base. IMPRESSION: New right middle lobe consolidation or collapse. Increased atelectasis or infiltrate in left lung base. Persistent small bilateral pleural effusions. Electronically Signed   By: Marlyce Sine M.D.   On: 02/24/2024 11:58    Procedures .Critical Care  Performed by: Auston Blush, MD Authorized by: Auston Blush, MD   Critical care provider statement:    Critical care time (minutes):  85   Critical care end time:  02/24/2024 4:32 PM   Critical care time was exclusive of:  Separately billable procedures and treating other patients and teaching time   Critical care was necessary to treat or prevent imminent or life-threatening deterioration of the following conditions:  Respiratory failure   Critical care was time spent personally by me on the following activities:  Development of treatment plan with patient or surrogate, discussions with consultants, evaluation of patient's response to treatment, examination of patient, ordering and review of laboratory studies, ordering and review of radiographic studies, ordering and performing treatments and interventions, pulse oximetry, re-evaluation of patient's condition and review of old charts     Medications Ordered in ED Medications  vancomycin  (VANCOREADY) IVPB 1250 mg/250 mL (has no administration in time range)  meropenem  (MERREM ) 1 g in sodium chloride  0.9 % 100 mL IVPB (0 g Intravenous Stopped 02/24/24 1546)    ED Course/ Medical Decision Making/ A&P                                  Medical Decision Making Amount and/or Complexity of Data Reviewed Labs: ordered. Radiology: ordered.  Risk Prescription drug management.   68 year old man history of cardiac transplant, end-stage renal disease, on dialysis presents today with increasing confusion, responsiveness, and dyspnea.  On evaluation here patient has a new right middle lobe infiltrate, pH 7.10 pCO2 of 100.  Patient treated here with IV antibiotics in consultation with pharmacy of meropenem  and vancomycin . Patient is mildly tachycardic with stable EKG Lactic acid is normal Blood cultures were sent BUN and creatinine at 30 and 4.4 insistent with patient's known renal failure. Discussed with critical care and they have seen and evaluated Consult placed to [facility] and they will call back BiPAP initiated 1 altered mental status likely secondary to CO2 retention-BiPAP initiated and will need ongoing evaluation.  Patient currently awake and alert and tolerating BiPAP well 2 right middle lobe infiltrate with patient's recent hospitalization and known immunosuppression antibiotics started in consultation with pharmacist.  Patient also evaluated for sepsis and has had stable heart rate, blood pressure, and normal lactic care. 3 history of cardiac transplant with immunosuppressant state.  Has normal blood pressure here with EKG unchanged 4 end-stage renal disease on dialysis patient has had recent access placed and was dialyzed this week does not appear to be volume overloaded and potassium is normal  Discussed with critical care at [facility],Dr. Pispiolis, on for icu,  Family wishes tx to [facility]  We discussed risks and benefits and family acknowledges risks  Patient accepted at [facility] with Dr. Juleen Oakland as attending Patient signed out to Dr. Leida Puna pending transfer Repeat abg pending and Dr. Leida Puna aware       Final Clinical Impression(s) / ED Diagnoses Final diagnoses:  Acute on chronic respiratory failure with  hypoxia and hypercapnia [facility])    Rx / DC Orders ED Discharge Orders     None         Auston Blush, MD 02/24/24 1640

## 2024-02-24 NOTE — ED Triage Notes (Signed)
 Pt arrives via POV from home with generalized weakness, lethargy. Per sister pt has hx of hypercarbia and recent admission to the hospital. Pt is on diaylsis, last treatment yesterday, also has hx of heart transplant.

## 2024-02-24 NOTE — ED Provider Notes (Signed)
 Patient waiting to go to [facility].  Currently on BiPAP and tolerating well.  Repeat ABG pending.  Patient has been accepted by [facility]. Repeat blood gas was a venous sample and pH was 7.09 and CO2 of 104.  On repeat evaluation patient still would wake up and talk but now he is removing his BiPAP because he reports it hurts his face and he reports feeling short of breath.  At this time discussed with [facility] with the ICU and because patient will need to be transported will intubate for airway protection and to improve CO2.  Discussed this with the patient and his wife.  They are both understanding of this and have agreed to proceed.  Intubation without any complications.  A second IV was also placed.  [facility] here to transport patient to [facility].  Procedure Name: Intubation Date/Time: 02/24/2024 7:35 PM  Performed by: Almond Army, MDPre-anesthesia Checklist: Patient identified, Patient being monitored, Emergency Drugs available, Timeout performed and Suction available Oxygen Delivery Method: Non-rebreather mask Preoxygenation: Pre-oxygenation with 100% oxygen Induction Type: Rapid sequence Ventilation: Mask ventilation without difficulty Laryngoscope Size: Glidescope and 4 Grade View: Grade I Tube size: 7.5 mm Number of attempts: 1 Airway Equipment and Method: Patient positioned with wedge pillow Placement Confirmation: ETT inserted through vocal cords under direct vision, CO2 detector, Breath sounds checked- equal and bilateral and Positive ETCO2 Secured at: 24 cm Tube secured with: Tape Dental Injury: Teeth and Oropharynx as per pre-operative assessment  Difficulty Due To: Difficulty was unanticipated    .Ultrasound ED Peripheral IV (Provider)  Date/Time: 02/24/2024 7:36 PM  Performed by: Almond Army, MD Authorized by: Almond Army, MD   Procedure details:    Indications: poor IV access     Skin Prep: chlorhexidine  gluconate     Location: left  upper arm.   Angiocath:  20 G   Bedside Ultrasound Guided: Yes     Images: not archived     Patient tolerated procedure without complications: Yes     Dressing applied: Yes     I have independently visualized and interpreted pt's images today.  Chest x-ray was done after intubation to confirm placement.  Tube is 2 cm above the carina without evidence of any pneumothorax or abnormal lung findings.  At this time feel that patient is stable for transport.  CRITICAL CARE Performed by: Felesha Moncrieffe Total critical care time: 45 minutes Critical care time was exclusive of separately billable procedures and treating other patients. Critical care was necessary to treat or prevent imminent or life-threatening deterioration. Critical care was time spent personally by me on the following activities: development of treatment plan with patient and/or surrogate as well as nursing, discussions with consultants, evaluation of patient's response to treatment, examination of patient, obtaining history from patient or surrogate, ordering and performing treatments and interventions, ordering and review of laboratory studies, ordering and review of radiographic studies, pulse oximetry and re-evaluation of patient's condition.     Almond Army, MD 02/24/24 [phone]

## 2024-02-25 ENCOUNTER — Ambulatory Visit: Admit: 2024-02-25 | Payer: MEDICARE

## 2024-02-25 ENCOUNTER — Ambulatory Visit
Admit: 2024-02-25 | Discharge: 2024-03-24 | Disposition: E | Payer: MEDICARE | Source: Other Acute Inpatient Hospital | Admitting: Cardiovascular Disease

## 2024-02-25 ENCOUNTER — Encounter: Admit: 2024-02-25 | Payer: MEDICARE

## 2024-02-25 ENCOUNTER — Ambulatory Visit: Admit: 2024-02-25 | Discharge: 2024-03-10 | Payer: MEDICARE

## 2024-02-25 ENCOUNTER — Encounter: Admit: 2024-02-25 | Payer: MEDICARE | Attending: Student in an Organized Health Care Education/Training Program

## 2024-02-25 ENCOUNTER — Encounter
Admit: 2024-02-25 | Discharge: 2024-03-10 | Payer: MEDICARE | Attending: Student in an Organized Health Care Education/Training Program

## 2024-02-25 ENCOUNTER — Inpatient Hospital Stay
Admit: 2024-02-25 | Discharge: 2024-03-24 | Disposition: E | Payer: MEDICARE | Source: Other Acute Inpatient Hospital | Admitting: Cardiovascular Disease

## 2024-02-25 ENCOUNTER — Encounter
Admit: 2024-02-25 | Discharge: 2024-03-24 | Disposition: E | Payer: MEDICARE | Source: Other Acute Inpatient Hospital | Attending: Cardiovascular Disease | Admitting: Cardiovascular Disease

## 2024-02-26 ENCOUNTER — Telehealth (HOSPITAL_COMMUNITY): Payer: Self-pay | Admitting: *Deleted

## 2024-02-26 MED FILL — Fentanyl Citrate-NaCl 0.9% IV Soln 2.5 MG/250ML: INTRAVENOUS | Qty: 250 | Status: AC

## 2024-02-26 NOTE — Addendum Note (Signed)
 Encounter addended by: Lajuana Pilar, RRT on: 02/26/2024 9:48 AM  Actions taken: Flowsheet data copied forward, Flowsheet accepted

## 2024-02-26 NOTE — Telephone Encounter (Signed)
 Mariah Shines reported that Rickey Erickson is admitted to [facility] with ongoing medical issues. He will need to discharge from pulmonary rehab at this time.  German Koller BS, ACSM-CEP 02/26/2024 10:14 AM

## 2024-02-27 ENCOUNTER — Encounter (HOSPITAL_COMMUNITY)

## 2024-02-27 NOTE — Addendum Note (Signed)
 Encounter addended by: Lajuana Pilar, RRT on: 02/27/2024 12:03 PM  Actions taken: Flowsheet accepted, Episode resolved, Clinical Note Signed

## 2024-02-27 NOTE — Progress Notes (Signed)
 Discharge Progress Report  Patient Details  Name: Rickey Erickson MRN: [id number] Date of Birth: 02/04/1956 Referring Provider:   Gattis Kass Pulmonary Rehab Walk Test from 09/18/2023 in [facility]  Referring Provider Pierce Brewster        Number of Visits: 29  Reason for Discharge:  Patient has met program and personal goals.  Smoking History:  Social History   Tobacco Use  Smoking Status Never  Smokeless Tobacco Never    Diagnosis:  ILD (interstitial lung disease) (HCC)  ADL [facility]:  Pulmonary Assessment Scores     Row Name 09/18/23 0812         ADL [facility]   ADL Phase Entry       mMRC Score   mMRC Score 2              Initial Exercise Prescription:  Initial Exercise Prescription - 09/18/23 0900       Date of Initial Exercise RX and Referring Provider   Date 09/18/23    Referring Provider Pierce Brewster    Expected Discharge Date 12/14/23      Oxygen   Maintain Oxygen Saturation 88% or higher      Recumbant Bike   Level 1    Minutes 15    METs 2.5      Track   Minutes 15    METs 2.5      Intensity   THRR 40-80% of Max Heartrate 62-124    Ratings of Perceived Exertion 11-13    Perceived Dyspnea 0-4      Progression   Progression Continue to progress workloads to maintain intensity without signs/symptoms of physical distress.      Resistance Training   Training Prescription Yes    Weight red bands    Reps 10-15             Discharge Exercise Prescription (Final Exercise Prescription Changes):  Exercise Prescription Changes - 02/20/24 0900       Response to Exercise   Blood Pressure (Admit) 114/60    Blood Pressure (Exit) 102/60    Heart Rate (Admit) 108 bpm    Heart Rate (Exercise) 119 bpm    Heart Rate (Exit) 108 bpm    Oxygen Saturation (Admit) 93 %    Oxygen Saturation (Exercise) 97 %    Oxygen Saturation (Exit) 100 %    Rating of Perceived Exertion (Exercise) 15    Perceived  Dyspnea (Exercise) 2    Duration Continue with 30 min of aerobic exercise without signs/symptoms of physical distress.    Intensity THRR unchanged      Progression   Progression Continue to progress workloads to maintain intensity without signs/symptoms of physical distress.      Resistance Training   Training Prescription Yes    Weight red bands    Reps 10-15    Time 10 Minutes      Oxygen   Oxygen Continuous    Liters 2      Recumbant Bike   Level 3    Minutes 15    METs 2.8      Track   Laps 6.5    Minutes 15    METs 2      Oxygen   Maintain Oxygen Saturation 88% or higher             Functional Capacity:  6 Minute Walk     Row Name 09/18/23 [phone]  6 Minute Walk   Phase Initial     Distance 1140 feet     Walk Time 6 minutes     # of Rest Breaks 0     MPH 2.16     METS 3.68     RPE 13     Perceived Dyspnea  2     VO2 Peak 12.87     Symptoms No     Resting HR 114 bpm     Resting BP 130/68     Resting Oxygen Saturation  95 %     Exercise Oxygen Saturation  during 6 min walk 92 %     Max Ex. HR 131 bpm     Max Ex. BP 134/68     2 Minute Post BP 122/70       Interval HR   1 Minute HR 122     2 Minute HR 128     3 Minute HR 129     4 Minute HR 128     5 Minute HR 131     6 Minute HR 131     2 Minute Post HR 119     Interval Heart Rate? Yes       Interval Oxygen   Interval Oxygen? Yes     Baseline Oxygen Saturation % 95 %     1 Minute Oxygen Saturation % 94 %     1 Minute Liters of Oxygen 0 L     2 Minute Oxygen Saturation % 94 %     2 Minute Liters of Oxygen 0 L     3 Minute Oxygen Saturation % 92 %     3 Minute Liters of Oxygen 0 L     4 Minute Oxygen Saturation % 92 %     4 Minute Liters of Oxygen 0 L     5 Minute Oxygen Saturation % 93 %     5 Minute Liters of Oxygen 0 L     6 Minute Oxygen Saturation % 92 %     6 Minute Liters of Oxygen 0 L     2 Minute Post Oxygen Saturation % 95 %     2 Minute Post Liters of Oxygen 0 L               Psychological, QOL, Others - Outcomes: PHQ 2/9:    09/18/2023    8:09 AM 04/24/2023    9:22 AM  Depression screen PHQ 2/9  Decreased Interest 0 0  Down, Depressed, Hopeless 0 0  PHQ - 2 Score 0 0  Altered sleeping 1 0  Tired, decreased energy 0 0  Change in appetite 0 0  Feeling bad or failure about yourself  0 0  Trouble concentrating 0 0  Moving slowly or fidgety/restless 0 0  Suicidal thoughts 0 0  PHQ-9 Score 1 0  Difficult doing work/chores Not difficult at all Not difficult at all    Quality of Life:   Personal Goals: Goals established at orientation with interventions provided to work toward goal.  Personal Goals and Risk Factors at Admission - 09/18/23 0913       Core Components/Risk Factors/Patient Goals on Admission    Weight Management --    Improve shortness of breath with ADL's Yes    Intervention Provide education, individualized exercise plan and daily activity instruction to help decrease symptoms of SOB with activities of daily living.    Expected Outcomes Short Term: Improve cardiorespiratory fitness to  achieve a reduction of symptoms when performing ADLs;Long Term: Be able to perform more ADLs without symptoms or delay the onset of symptoms              Personal Goals Discharge:  Goals and Risk Factor Review     Row Name 09/20/23 0805 10/06/23 1422 11/13/23 1007 12/06/23 1426 01/04/24 1151     Core Components/Risk Factors/Patient Goals Review   Personal Goals Review Improve shortness of breath with ADL's;Develop more efficient breathing techniques such as purse lipped breathing and diaphragmatic breathing and practicing self-pacing with activity. Improve shortness of breath with ADL's;Develop more efficient breathing techniques such as purse lipped breathing and diaphragmatic breathing and practicing self-pacing with activity. Improve shortness of breath with ADL's Improve shortness of breath with ADL's Improve shortness of breath  with ADL's   Review Skip is scheduled to start PR on 09/26/23. Goal progressing for improving shortness of breath with ADL's. Goal progressing for developing more efficient breathing techniques such as purse lipped breathing and diaphragmatic breathing; and practicing self-pacing with activity. Monthly review of patient's Core Components/Risk Factors/Patient Goals are as follows: Goal progressing on improving Skip's shortness of breath with ADLs. Skip has been able to wean off oxygen with exertion. He currently only wears oxygen at night. Skip stated he is able to do "a little" more for himself than previously. He can walk farther and has more stamina. Goal met for developing more efficient breathing techniques such as purse lipped breathing and diaphragmatic breathing; and practicing self-pacing activities. Skip can initiate purse lipped breathing on his own while exercising. He can correctly demonstrate diaphragmatic breathing to staff. He knows how to self-pace himself before he becomes breathless with exertion and/or exercising. Skip will continue to benefit from participation in PR for nutrition, education, exercise, and lifestyle modification. Monthly review of patient's Core Components/Risk Factors/Patient Goals are as follows: Goal progressing on improving Skip's shortness of breath with ADLs. Skip has been able to wean off oxygen with exertion. He currently only wears oxygen at night. Skip stated he is able to do "a little" more for himself than previously. He can walk farther and has more stamina. Skip will continue to benefit from participation in PR for nutrition, education, exercise, and lifestyle modification. Monthly review of patient's Core Components/Risk Factors/Patient Goals are as follows: Goal progressing on improving Skip's shortness of breath with ADLs. Skip has been able to wean off oxygen with exertion. He currently only wears oxygen at night. Skip stated he is able to do "a little" more for  himself than previously. He can walk farther and has more stamina. Skip will continue to benefit from participation in PR for nutrition, education, exercise, and lifestyle modification. Monthly review of patient's Core Components/Risk Factors/Patient Goals are as follows: Goal progressing on improving Skip's shortness of breath with ADLs. Skip is able to exercise on RA to keep his sats >88%. He is currently exercising on the recumbant bike and walking the track. Skip will continue to benefit from participation in PR for nutrition, education, exercise, and lifestyle modification.   Expected Outcomes For Skip to improve shortness of breath with ADL's and develop more efficient breathing techniques such as purse lipped breathing and diaphragmatic breathing; and practicing self-pacing with activity. For Skip to improve shortness of breath with ADL's For Skip to improve shortness of breath with ADL's For Skip to improve shortness of breath with ADL's For Skip to improve shortness of breath with ADL's    Row Name 01/29/24 1124 02/26/24  1610 02/27/24 1157         Core Components/Risk Factors/Patient Goals Review   Personal Goals Review Improve shortness of breath with ADL's Improve shortness of breath with ADL's Improve shortness of breath with ADL's     Review Monthly review of patient's Core Components/Risk Factors/Patient Goals are as follows: Goal progressing on improving Skip's shortness of breath with ADLs. Skip is currently using 2L to keep his sats >88% while exercising. He is currently exercising on the recumbant bike and walking the track. Skip will continue to benefit from participation in PR for nutrition, education, exercise, and lifestyle modification. Monthly review of patient's Core Components/Risk Factors/Patient Goals are as follows: Goal progressing on improving Skip's shortness of breath with ADLs. Skip is currently using 2L to keep his sats >88% while exercising. He is currently exercising on  the recumbant bike and walking the track. Skip will continue to benefit from participation in PR for nutrition, education, exercise, and lifestyle modification. Skip was discharged from the program on 02/26/24 due to being in the hospital. He did not meet his goal for improving shortness of breath with ADL's.     Expected Outcomes For Skip to improve shortness of breath with ADL's For Skip to improve shortness of breath with ADL's To continue to exercise and modify his nutrition and lifestyle post discharge              Exercise Goals and Review:  Exercise Goals     Row Name 09/18/23 0813             Exercise Goals   Increase Physical Activity Yes       Intervention Provide advice, education, support and counseling about physical activity/exercise needs.;Develop an individualized exercise prescription for aerobic and resistive training based on initial evaluation findings, risk stratification, comorbidities and participant's personal goals.       Expected Outcomes Short Term: Attend rehab on a regular basis to increase amount of physical activity.;Long Term: Exercising regularly at least 3-5 days a week.;Long Term: Add in home exercise to make exercise part of routine and to increase amount of physical activity.       Increase Strength and Stamina Yes       Intervention Provide advice, education, support and counseling about physical activity/exercise needs.;Develop an individualized exercise prescription for aerobic and resistive training based on initial evaluation findings, risk stratification, comorbidities and participant's personal goals.       Expected Outcomes Short Term: Increase workloads from initial exercise prescription for resistance, speed, and METs.;Short Term: Perform resistance training exercises routinely during rehab and add in resistance training at home;Long Term: Improve cardiorespiratory fitness, muscular endurance and strength as measured by increased METs and  functional capacity ( )       Able to understand and use rate of perceived exertion (RPE) scale Yes       Intervention Provide education and explanation on how to use RPE scale       Expected Outcomes Short Term: Able to use RPE daily in rehab to express subjective intensity level;Long Term:  Able to use RPE to guide intensity level when exercising independently       Able to understand and use Dyspnea scale Yes       Intervention Provide education and explanation on how to use Dyspnea scale       Expected Outcomes Short Term: Able to use Dyspnea scale daily in rehab to express subjective sense of shortness of breath during exertion;Long Term: Able to  use Dyspnea scale to guide intensity level when exercising independently       Knowledge and understanding of Target Heart Rate Range (THRR) Yes       Intervention Provide education and explanation of THRR including how the numbers were predicted and where they are located for reference       Expected Outcomes Short Term: Able to state/look up THRR;Long Term: Able to use THRR to govern intensity when exercising independently;Short Term: Able to use daily as guideline for intensity in rehab       Understanding of Exercise Prescription Yes       Intervention Provide education, explanation, and written materials on patient's individual exercise prescription       Expected Outcomes Short Term: Able to explain program exercise prescription;Long Term: Able to explain home exercise prescription to exercise independently                Exercise Goals Re-Evaluation:  Exercise Goals Re-Evaluation     Row Name 09/18/23 0813 10/05/23 1620 11/06/23 0855 12/04/23 0916 01/03/24 0928     Exercise Goal Re-Evaluation   Exercise Goals Review Increase Physical Activity;Able to understand and use Dyspnea scale;Understanding of Exercise Prescription;Increase Strength and Stamina;Knowledge and understanding of Target Heart Rate Range (THRR);Able to understand and  use rate of perceived exertion (RPE) scale Increase Physical Activity;Able to understand and use Dyspnea scale;Understanding of Exercise Prescription;Increase Strength and Stamina;Knowledge and understanding of Target Heart Rate Range (THRR);Able to understand and use rate of perceived exertion (RPE) scale Increase Physical Activity;Able to understand and use Dyspnea scale;Understanding of Exercise Prescription;Increase Strength and Stamina;Knowledge and understanding of Target Heart Rate Range (THRR);Able to understand and use rate of perceived exertion (RPE) scale Increase Physical Activity;Able to understand and use Dyspnea scale;Understanding of Exercise Prescription;Increase Strength and Stamina;Knowledge and understanding of Target Heart Rate Range (THRR);Able to understand and use rate of perceived exertion (RPE) scale Increase Physical Activity;Able to understand and use Dyspnea scale;Understanding of Exercise Prescription;Increase Strength and Stamina;Knowledge and understanding of Target Heart Rate Range (THRR);Able to understand and use rate of perceived exertion (RPE) scale   Comments Skip is scheduled begin exercise again on 12/3. Skip has completed 3 exercise sessions. He exercises for 15 min on the recumbent bike and track. Skip averages 2.4 METs at level 2 on the recumbent bike and 1.92 METs on the track. He performs the warmup and cooldown mostly standing dependent on his shortness of breath. It is too soon to notate any discernable differences. Will continue to monitor and progress as able. Skip has completed 9 exercise sessions. He exercises for 15 min on the recumbent bike and track. Skip averages 2.5 METs at level 3 on the recumbent bike and 1.77 METs on the track. He performs the warmup and cooldown mostly standing dependent on his shortness of breath. Skip has increased his workload for the recumbent bike as METs have slightly increased. His track METs have remained relatively the same. Skip  also seems to do more of the warmup and cooldown standing. Will continue to monitor and progress as able. Skip has completed 16 exercise sessions. He exercises for 15 min on the recumbent bike and track. Skip averages 2.3 METs at level 3 on the recumbent bike and 2.81 METs on the track. He performs the warmup and cooldown mostly standing dependent on his shortness of breath. Skip has increased his METs for the track although METs remain about the same on the recumbent bike. I have asked Skip he needed to  discuss home exercise. Skip did not wish to discuss home exercise as he feels has has all the resources he needs to exercise outside of rehab. Will continue to monitor and progress as able. Skip has completed 23 exercise sessions. He exercises for 15 min on the recumbent bike and track. Skip averages 2.7 METs at level 3 on the recumbent bike and 1.92 METs on the track. He performs the warmup and cooldown mostly standing dependent on his shortness of breath. Skip has struggled to progress lately due to a recent hospitalization. Will continue to monitor and progress as able.   Expected Outcomes Through exercise at rehab and home, the patient will decrease shortness of breath with daily activities and feel confident in carrying out an exercise regimen at home. Through exercise at rehab and home, the patient will decrease shortness of breath with daily activities and feel confident in carrying out an exercise regimen at home. Through exercise at rehab and home, the patient will decrease shortness of breath with daily activities and feel confident in carrying out an exercise regimen at home. Through exercise at rehab and home, the patient will decrease shortness of breath with daily activities and feel confident in carrying out an exercise regimen at home. Through exercise at rehab and home, the patient will decrease shortness of breath with daily activities and feel confident in carrying out an exercise regimen at  home.    Row Name 01/29/24 0944 02/27/24 0631           Exercise Goal Re-Evaluation   Exercise Goals Review Increase Physical Activity;Able to understand and use Dyspnea scale;Understanding of Exercise Prescription;Increase Strength and Stamina;Knowledge and understanding of Target Heart Rate Range (THRR);Able to understand and use rate of perceived exertion (RPE) scale Increase Physical Activity;Able to understand and use Dyspnea scale;Understanding of Exercise Prescription;Increase Strength and Stamina;Knowledge and understanding of Target Heart Rate Range (THRR);Able to understand and use rate of perceived exertion (RPE) scale      Comments Skip has completed 28 exercise sessions. He exercises for 15 min on the recumbent bike and track. Skip averages 2.6 METs at level 3 on the recumbent bike and 1.92 METs on the track. He performs the warmup and cooldown mostly standing dependent on his shortness of breath. Skip's METs have remained relatively the same. He still struggles to progress due to his breathing. Skip is still very motivated to exercise. Will continue to monitor and progress as able. Skip has completed 29 exercise sessions. Peak METs were 2.8 on the recumbent bike and 1.98 on the track. Skip is being discharged due to hospitalization.      Expected Outcomes Through exercise at rehab and home, the patient will decrease shortness of breath with daily activities and feel confident in carrying out an exercise regimen at home. Through exercise at rehab and home, the patient will decrease shortness of breath with daily activities and feel confident in carrying out an exercise regimen at home.               Nutrition & Weight - Outcomes:  Pre Biometrics - 09/18/23 0805       Pre Biometrics   Grip Strength 22 kg              Nutrition:  Nutrition Therapy & Goals - 02/01/24 1041       Nutrition Therapy   Diet Heart Healthy Diet    Drug/Food Interactions Statins/Certain Fruits       Personal Nutrition Goals   Nutrition  Goal Patient to identify strategies for weight maintenance/ weight gain of 0.5-2.0# per week.   goal in progress.   Comments Goal in progress. Skip has previously participated in pulmonary rehab in September 2024. Skip continues regular follow-up for kidney transplant; he has required dialysis since heart transplant in 2021. He currently receives hemodialysis M/W/F. His weight has been stable since new start to pulmonary rehab at 54.3kg (09/18/23).He lives with his sister who is a great support and does all of the grocery shopping and cooking. Skip reports drinking 1 Boost Breeze daily (250kcals, 9g protein) and 1 [employer] Milkshake (580kcals, 13g protein) or similar daily to aid with weight gain/weight maintenance. We have discussed multiple strategies for weight gain including increasing eating frequency, protein supplements, increasing calories from fat/protein/carbohydrates, etc. Dialysis team continues to monitor weight as well. Patient will benefit from participation in pulmonary rehab for nutrition, exercise, and lifestyle modification.      Intervention Plan   Intervention Prescribe, educate and counsel regarding individualized specific dietary modifications aiming towards targeted core components such as weight, hypertension, lipid management, diabetes, heart failure and other comorbidities.;Nutrition handout(s) given to patient.    Expected Outcomes Long Term Goal: Adherence to prescribed nutrition plan.;Short Term Goal: Understand basic principles of dietary content, such as calories, fat, sodium, cholesterol and nutrients.             Nutrition Discharge:   Education Questionnaire Score:   Goals reviewed with patient; copy given to patient.

## 2024-02-27 NOTE — Addendum Note (Signed)
 Encounter addended by: Floretta Huron on: 02/27/2024 6:34 AM  Actions taken: Flowsheet data copied forward, Flowsheet accepted

## 2024-02-29 ENCOUNTER — Encounter (HOSPITAL_COMMUNITY)

## 2024-02-29 LAB — CULTURE, BLOOD (ROUTINE X 2)
Culture: NO GROWTH
Culture: NO GROWTH

## 2024-03-05 ENCOUNTER — Encounter (HOSPITAL_COMMUNITY)

## 2024-03-07 ENCOUNTER — Encounter (HOSPITAL_COMMUNITY)

## 2024-03-12 ENCOUNTER — Encounter (HOSPITAL_COMMUNITY)

## 2024-03-14 ENCOUNTER — Encounter (HOSPITAL_COMMUNITY)

## 2024-03-19 ENCOUNTER — Encounter (HOSPITAL_COMMUNITY)

## 2024-03-21 ENCOUNTER — Encounter (HOSPITAL_COMMUNITY)

## 2024-03-24 DEATH — deceased

## 2024-03-26 ENCOUNTER — Encounter (HOSPITAL_COMMUNITY)
